# Patient Record
Sex: Female | Born: 1940 | Race: White | Hispanic: No | Smoking: Never smoker
Health system: Southern US, Community
[De-identification: ages and names within clinical notes are randomized; demographics above are authoritative.]

## PROBLEM LIST (undated history)

## (undated) DIAGNOSIS — I340 Nonrheumatic mitral (valve) insufficiency: Secondary | ICD-10-CM

## (undated) DIAGNOSIS — Z9889 Other specified postprocedural states: Secondary | ICD-10-CM

## (undated) DIAGNOSIS — E669 Obesity, unspecified: Secondary | ICD-10-CM

## (undated) DIAGNOSIS — Z953 Presence of xenogenic heart valve: Secondary | ICD-10-CM

## (undated) DIAGNOSIS — I10 Essential (primary) hypertension: Secondary | ICD-10-CM

## (undated) DIAGNOSIS — I5032 Chronic diastolic (congestive) heart failure: Secondary | ICD-10-CM

## (undated) DIAGNOSIS — I428 Other cardiomyopathies: Secondary | ICD-10-CM

## (undated) DIAGNOSIS — I5189 Other ill-defined heart diseases: Secondary | ICD-10-CM

## (undated) DIAGNOSIS — J449 Chronic obstructive pulmonary disease, unspecified: Secondary | ICD-10-CM

## (undated) DIAGNOSIS — I00 Rheumatic fever without heart involvement: Secondary | ICD-10-CM

## (undated) DIAGNOSIS — J9611 Chronic respiratory failure with hypoxia: Secondary | ICD-10-CM

## (undated) DIAGNOSIS — Q231 Congenital insufficiency of aortic valve: Secondary | ICD-10-CM

## (undated) DIAGNOSIS — I272 Pulmonary hypertension, unspecified: Secondary | ICD-10-CM

## (undated) DIAGNOSIS — E785 Hyperlipidemia, unspecified: Secondary | ICD-10-CM

## (undated) DIAGNOSIS — N184 Chronic kidney disease, stage 4 (severe): Secondary | ICD-10-CM

## (undated) DIAGNOSIS — Z8679 Personal history of other diseases of the circulatory system: Secondary | ICD-10-CM

## (undated) DIAGNOSIS — Z8673 Personal history of transient ischemic attack (TIA), and cerebral infarction without residual deficits: Secondary | ICD-10-CM

## (undated) DIAGNOSIS — Q2381 Bicuspid aortic valve: Secondary | ICD-10-CM

## (undated) DIAGNOSIS — M1990 Unspecified osteoarthritis, unspecified site: Secondary | ICD-10-CM

## (undated) DIAGNOSIS — I480 Paroxysmal atrial fibrillation: Secondary | ICD-10-CM

## (undated) HISTORY — DX: Obesity, unspecified: E66.9

## (undated) HISTORY — PX: ABDOMINAL HYSTERECTOMY: SHX81

## (undated) HISTORY — PX: OTHER SURGICAL HISTORY: SHX169

## (undated) HISTORY — PX: TOTAL KNEE ARTHROPLASTY: SHX125

## (undated) HISTORY — DX: Hyperlipidemia, unspecified: E78.5

## (undated) HISTORY — DX: Rheumatic fever without heart involvement: I00

## (undated) SURGERY — ECHOCARDIOGRAM, TRANSESOPHAGEAL
Anesthesia: Moderate Sedation

---

## 2000-05-10 ENCOUNTER — Ambulatory Visit (HOSPITAL_COMMUNITY): Admission: RE | Admit: 2000-05-10 | Discharge: 2000-05-11 | Payer: Self-pay

## 2002-06-22 ENCOUNTER — Inpatient Hospital Stay (HOSPITAL_COMMUNITY): Admission: EM | Admit: 2002-06-22 | Discharge: 2002-06-23 | Payer: Self-pay | Admitting: Emergency Medicine

## 2002-06-22 ENCOUNTER — Encounter: Payer: Self-pay | Admitting: Emergency Medicine

## 2002-06-26 ENCOUNTER — Ambulatory Visit (HOSPITAL_COMMUNITY): Admission: RE | Admit: 2002-06-26 | Discharge: 2002-06-26 | Payer: Self-pay | Admitting: Internal Medicine

## 2002-06-26 ENCOUNTER — Encounter: Payer: Self-pay | Admitting: Internal Medicine

## 2003-11-09 ENCOUNTER — Emergency Department (HOSPITAL_COMMUNITY): Admission: EM | Admit: 2003-11-09 | Discharge: 2003-11-09 | Payer: Self-pay | Admitting: Emergency Medicine

## 2004-02-05 ENCOUNTER — Ambulatory Visit: Payer: Self-pay | Admitting: Family Medicine

## 2004-02-11 ENCOUNTER — Ambulatory Visit (HOSPITAL_COMMUNITY): Admission: RE | Admit: 2004-02-11 | Discharge: 2004-02-11 | Payer: Self-pay | Admitting: Family Medicine

## 2004-03-10 ENCOUNTER — Emergency Department (HOSPITAL_COMMUNITY): Admission: EM | Admit: 2004-03-10 | Discharge: 2004-03-10 | Payer: Self-pay | Admitting: Emergency Medicine

## 2004-03-31 ENCOUNTER — Ambulatory Visit: Payer: Self-pay | Admitting: Family Medicine

## 2004-06-09 ENCOUNTER — Ambulatory Visit: Payer: Self-pay | Admitting: Family Medicine

## 2004-08-11 ENCOUNTER — Ambulatory Visit: Payer: Self-pay | Admitting: Family Medicine

## 2004-08-12 ENCOUNTER — Ambulatory Visit (HOSPITAL_COMMUNITY): Admission: RE | Admit: 2004-08-12 | Discharge: 2004-08-12 | Payer: Self-pay | Admitting: Family Medicine

## 2004-12-14 ENCOUNTER — Ambulatory Visit: Payer: Self-pay | Admitting: Physical Medicine & Rehabilitation

## 2004-12-14 ENCOUNTER — Inpatient Hospital Stay (HOSPITAL_COMMUNITY): Admission: RE | Admit: 2004-12-14 | Discharge: 2004-12-21 | Payer: Self-pay | Admitting: Orthopedic Surgery

## 2004-12-18 ENCOUNTER — Ambulatory Visit: Payer: Self-pay | Admitting: Internal Medicine

## 2005-01-13 ENCOUNTER — Ambulatory Visit: Payer: Self-pay | Admitting: Cardiology

## 2005-01-18 ENCOUNTER — Ambulatory Visit: Payer: Self-pay | Admitting: Cardiology

## 2005-01-19 ENCOUNTER — Ambulatory Visit: Payer: Self-pay | Admitting: Cardiology

## 2011-02-12 DIAGNOSIS — R0602 Shortness of breath: Secondary | ICD-10-CM

## 2011-02-12 DIAGNOSIS — I509 Heart failure, unspecified: Secondary | ICD-10-CM

## 2011-02-15 ENCOUNTER — Ambulatory Visit (INDEPENDENT_AMBULATORY_CARE_PROVIDER_SITE_OTHER): Payer: Medicare Other | Admitting: *Deleted

## 2011-02-15 ENCOUNTER — Encounter: Payer: Self-pay | Admitting: Cardiovascular Disease

## 2011-02-15 ENCOUNTER — Encounter: Payer: Self-pay | Admitting: *Deleted

## 2011-02-15 VITALS — BP 101/67 | HR 68 | Ht 62.0 in | Wt 185.0 lb

## 2011-02-15 DIAGNOSIS — I10 Essential (primary) hypertension: Secondary | ICD-10-CM

## 2011-02-15 DIAGNOSIS — I5030 Unspecified diastolic (congestive) heart failure: Secondary | ICD-10-CM

## 2011-02-15 DIAGNOSIS — R079 Chest pain, unspecified: Secondary | ICD-10-CM

## 2011-02-15 NOTE — Progress Notes (Signed)
Patient came today for nurse visit to check ekg and vitals since heart rate was elevated during hospitalization. Patient denies dizziness,chest pain or sob. Patient hasn't taken any medications today. Ekg and vitals reviewed by Gene Serpe.

## 2011-02-25 ENCOUNTER — Encounter: Payer: Medicare Other | Admitting: Physician Assistant

## 2011-03-19 ENCOUNTER — Encounter: Payer: Medicare Other | Admitting: Physician Assistant

## 2011-04-01 ENCOUNTER — Encounter: Payer: Medicare Other | Admitting: Physician Assistant

## 2012-08-09 DIAGNOSIS — R0602 Shortness of breath: Secondary | ICD-10-CM

## 2012-08-30 ENCOUNTER — Encounter: Payer: Medicare Other | Admitting: Physician Assistant

## 2012-09-13 ENCOUNTER — Encounter: Payer: Medicare Other | Admitting: Physician Assistant

## 2012-09-13 NOTE — Progress Notes (Signed)
Primary Cardiologist: Rollene Rotunda, MD (new)   HPI: Post hospital followup from [facility], status post presentation with acute/chronic DHF. We were not formally consulted. Troponins NL. BNP 290 on admission.  Small, bilateral pleural effusions noted on CTA of chest, which was negative for pulmonary embolus. An echocardiogram was not ordered, given that she had one during her previous hospitalization in March, with results as follows:   - Echocardiogram, 05/2012: EF 60-65%; mild AR/MR; moderate PHTN (RVSP 45-50 mmHg)  Allergies  Allergen Reactions  . Indomethacin Other (See Comments)    dizziness  . Norvasc (Amlodipine Besylate) Cough    Current Outpatient Prescriptions  Medication Sig Dispense Refill  . acetaminophen (TYLENOL) 500 MG tablet Take 500 mg by mouth every 6 (six) hours as needed.        Marland Kitchen aspirin 325 MG tablet Take 325 mg by mouth daily.        Marland Kitchen Cod Liver Oil 1000 MG CAPS Take 1 capsule by mouth daily.        . furosemide (LASIX) 40 MG tablet Take 40 mg by mouth daily.        . pravastatin (PRAVACHOL) 20 MG tablet Take 20 mg by mouth daily.        . solifenacin (VESICARE) 5 MG tablet Take 5 mg by mouth daily.        Marland Kitchen telmisartan (MICARDIS) 40 MG tablet Take 40 mg by mouth daily.         No current facility-administered medications for this visit.    Past Medical History  Diagnosis Date  . Congestive heart failure, unspecified   . Chronic airway obstruction, not elsewhere classified   . Precordial pain   . Swelling of limb   . Dizziness and giddiness   . Unspecified transient cerebral ischemia     Diag. 2003  . Acute, but ill-defined, cerebrovascular disease     2000 [facility]  . Shortness of breath   . Obesity   . Juvenile rheumatic fever     age 27  . Unspecified essential hypertension   . Hyperlipidemia     Past Surgical History  Procedure Laterality Date  . Total knee arthroplasty    . Abdominal hysterectomy      Cervical Cancer  . Parathyroid/thyroid  surgery      tumor    History   Social History  . Marital Status: Widowed    Spouse Name: N/A    Number of Children: N/A  . Years of Education: N/A   Occupational History  . Not on file.   Social History Main Topics  . Smoking status: Never Smoker   . Smokeless tobacco: Never Used  . Alcohol Use: No  . Drug Use: No  . Sexually Active: Not on file   Other Topics Concern  . 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 Heart failure Father   . Heart attack Brother     ROS: no nausea, vomiting; no fever, chills; no melena, hematochezia; no claudication  PHYSICAL EXAM: There were no vitals taken for this visit. GENERAL: 72 year-old female; NAD HEENT: NCAT, PERRLA, EOMI; sclera clear; no xanthelasma NECK: palpable bilateral carotid pulses, no bruits; no JVD; no TM LUNGS: CTA bilaterally CARDIAC: RRR (S1, S2); no significant murmurs; no rubs  or gallops ABDOMEN: soft, non-tender; intact BS EXTREMETIES: intact distal pulses; no significant peripheral edema SKIN: warm/dry; no obvious rash/lesions MUSCULOSKELETAL: no joint deformity NEURO: no focal deficit; NL affect   EKG: reviewed and available in Electronic Records   ASSESSMENT & PLAN:  No problem-specific assessment & plan notes found for this encounter.   [name] [name], PAC

## 2013-02-06 ENCOUNTER — Encounter: Payer: Self-pay | Admitting: Cardiology

## 2013-02-14 ENCOUNTER — Encounter: Payer: Self-pay | Admitting: Cardiology

## 2013-02-14 ENCOUNTER — Ambulatory Visit (INDEPENDENT_AMBULATORY_CARE_PROVIDER_SITE_OTHER): Payer: Medicare Other | Admitting: Cardiology

## 2013-02-14 VITALS — BP 100/67 | HR 65 | Ht 62.0 in | Wt 191.0 lb

## 2013-02-14 DIAGNOSIS — I059 Rheumatic mitral valve disease, unspecified: Secondary | ICD-10-CM

## 2013-02-14 DIAGNOSIS — I4891 Unspecified atrial fibrillation: Secondary | ICD-10-CM

## 2013-02-14 DIAGNOSIS — I5032 Chronic diastolic (congestive) heart failure: Secondary | ICD-10-CM

## 2013-02-14 DIAGNOSIS — I340 Nonrheumatic mitral (valve) insufficiency: Secondary | ICD-10-CM

## 2013-02-14 MED ORDER — ASPIRIN EC 81 MG PO TBEC: 81.0000 mg | DELAYED_RELEASE_TABLET | Freq: Every day | ORAL | Status: DC

## 2013-02-14 NOTE — Progress Notes (Addendum)
Clinical Summary [name] [name] is a 72 y.o.female last seen by PA Serpe, this is our first visit together. She was seen for the following medical problems.  1. Chronic diastolic heart failure 05/2012 echo: LVEF 60-65% - recent admission for volume overload 02/2013 at [facility], patient was diuresed with improved symptoms.   2. Mitral regurgitation - admitted 02/2013 to [facility] with 2 day history of SOB, found to be in heart failure. She was started on diuresis - repeat echo showed moderate to severe MR with elevated PASP - reports SOB at home. Sedentary lifestyle, limited physically because of knee problem. Notes DOE with daily activities. No orthopnea.    3. Paroxysmal afib - new diagnosis made during 02/2013 admission to [facility] - started on metoprolol for rate control 25 mg bid, further titration limited by low normal blood pressures, reported good rate control on this regimen in the hospital - started on xarelto for stroke anticoag - denies any palpitations, denies any bleeding issues on xarelto.      Past Medical History  Diagnosis Date  . Congestive heart failure, unspecified   . Chronic airway obstruction, not elsewhere classified   . Precordial pain   . Swelling of limb   . Dizziness and giddiness   . Unspecified transient cerebral ischemia     Diag. 2003  . Acute, but ill-defined, cerebrovascular disease     2000 [facility]  . Shortness of breath   . Obesity   . Juvenile rheumatic fever     age 81  . Unspecified essential hypertension   . Hyperlipidemia      Allergies  Allergen Reactions  . Indomethacin Other (See Comments)    dizziness  . Norvasc [Amlodipine Besylate] Cough     Current Outpatient Prescriptions  Medication Sig Dispense Refill  . acetaminophen (TYLENOL) 500 MG tablet Take 500 mg by mouth every 6 (six) hours as needed.        Marland Kitchen aspirin 325 MG tablet Take 325 mg by mouth daily.        Marland Kitchen Cod Liver Oil 1000 MG CAPS Take 1 capsule by mouth  daily.        . furosemide (LASIX) 40 MG tablet Take 40 mg by mouth daily.        . pravastatin (PRAVACHOL) 20 MG tablet Take 20 mg by mouth daily.        . solifenacin (VESICARE) 5 MG tablet Take 5 mg by mouth daily.        Marland Kitchen telmisartan (MICARDIS) 40 MG tablet Take 40 mg by mouth daily.         No current facility-administered medications for this visit.     Past Surgical History  Procedure Laterality Date  . Total knee arthroplasty    . Abdominal hysterectomy      Cervical Cancer  . Parathyroid/thyroid surgery      tumor     Allergies  Allergen Reactions  . Indomethacin Other (See Comments)    dizziness  . Norvasc [Amlodipine Besylate] Cough      Family History  Problem Relation Age of Onset  . Heart failure Father   . Heart attack Brother      Social History Ms. Sargent reports that she has never smoked. She has never used smokeless tobacco. Ms. Schexnider reports that she does not drink alcohol.   Review of Systems CONSTITUTIONAL: No weight loss, fever, chills, weakness or fatigue.  HEENT: Eyes: No visual loss, blurred vision, double vision or yellow  sclerae.No hearing loss, sneezing, congestion, runny nose or sore throat.  SKIN: No rash or itching.  CARDIOVASCULAR: per HPI RESPIRATORY: per HPI GASTROINTESTINAL: No anorexia, nausea, vomiting or diarrhea. No abdominal pain or blood.  GENITOURINARY: No burning on urination, no polyuria NEUROLOGICAL: No headache, dizziness, syncope, paralysis, ataxia, numbness or tingling in the extremities. No change in bowel or bladder control.  MUSCULOSKELETAL: No muscle, back pain, joint pain or stiffness.  LYMPHATICS: No enlarged nodes. No history of splenectomy.  PSYCHIATRIC: No history of depression or anxiety.  ENDOCRINOLOGIC: No reports of sweating, cold or heat intolerance. No polyuria or polydipsia.  Marland Kitchen   Physical Examination p 65 bp 100/67 Wt 191 lbs BMI 35 Gen: resting comfortably, no acute distress HEENT: no  scleral icterus, pupils equal round and reactive, no palptable cervical adenopathy,  CV: RRR, 2/6 systolic murmur at apex, no JVD, no carotid bruits Resp: Clear to auscultation bilaterally GI: abdomen is soft, non-tender, non-distended, normal bowel sounds, no hepatosplenomegaly MSK: extremities are warm, no edema.  Skin: warm, no rash Neuro:  no focal deficits Psych: appropriate affect   Diagnostic Studies 02/2011 Echo LVEF 60-65%, abnormal diastolic function, mild MR, PASP 50 mmHg.    Assessment and Plan  1. Chronic diastolic heart failure - recent admission for volume overload, her volume status looks normal today - continue bp control and current diuretic dosing  2. Mitral regurgitation - new diagnosis for the patient, per report rated as moderate to severe. We have requested the echo images to review, she may need further evaluation with TEE pending review  3. Afib - new diagnosis, no significant symptoms on rate control strategy. She was also started on xarelto for anticoagulation and is tolerating well - continue current medications.   F/u 3-4 weeks to readdress mitral regurgitation and possible need for TEE    Antoine Poche, M.D., F.A.C.C.  Addendum 02/16/13 Echo images reviewed. In most views consistent with moderate regurgitation, in the apical 2 chamber view the jet is more impressive, there appears to be 2 centrally directed regurgitant jets in this view, the fact that they are both centrally directed makes the overall color jet appear more impressive. Give her presentation with heart failure, the valve needs closer evaluation. We will plan a TEE.  Dina Rich MD

## 2013-02-14 NOTE — Patient Instructions (Signed)
   Decrease Aspirin to 81mg  daily  Continue all other medications.   Follow up in  3-4 weeks

## 2013-02-16 ENCOUNTER — Other Ambulatory Visit: Payer: Self-pay | Admitting: Cardiology

## 2013-02-16 ENCOUNTER — Telehealth: Payer: Self-pay | Admitting: Cardiology

## 2013-02-16 DIAGNOSIS — I340 Nonrheumatic mitral (valve) insufficiency: Secondary | ICD-10-CM | POA: Insufficient documentation

## 2013-02-16 DIAGNOSIS — R931 Abnormal findings on diagnostic imaging of heart and coronary circulation: Secondary | ICD-10-CM

## 2013-02-16 DIAGNOSIS — I4891 Unspecified atrial fibrillation: Secondary | ICD-10-CM | POA: Insufficient documentation

## 2013-02-16 NOTE — Telephone Encounter (Signed)
Message copied by Burnice Logan on Fri Feb 16, 2013 10:27 AM ------      Message from: Dina Rich F      Created: Fri Feb 16, 2013 10:12 AM       Please let patient know that I reviewed the ultrasound of her heart from [facility]. Her heart valve is moderately to severely leaky, and we do need to do that other type of ultrasound (a TEE, an ultrasound from the stomach) to take a closer look. This will be done at [facility], and can be done after the holidays. Please arrange a TEE at [facility] to be done on my hospital week there                  Dina Rich MD ------

## 2013-02-16 NOTE — Telephone Encounter (Signed)
Pt informed of results. Informed pt that someone would be in contact with her with a day and time for the test. Then I would call her back with her instructions. Pt verbalized understanding.

## 2013-02-19 ENCOUNTER — Encounter: Payer: Self-pay | Admitting: Cardiology

## 2013-02-20 ENCOUNTER — Encounter: Payer: Self-pay | Admitting: Cardiology

## 2013-02-20 ENCOUNTER — Telehealth: Payer: Self-pay | Admitting: Cardiology

## 2013-02-20 NOTE — Telephone Encounter (Signed)
Pt informed and letter mailed

## 2013-02-20 NOTE — Telephone Encounter (Signed)
Message copied by Burnice Logan on Tue Feb 20, 2013  4:04 PM ------      Message from: Zachary George T      Created: Mon Feb 19, 2013  4:17 PM      Regarding: TEE SCHEDULED        TEE scheduled for 03-06-13 she needs to arrive @ 10:30am      For 11:30am procedure. No letter given to patient with      Instructions. Patient needs to be notified.  ------

## 2013-02-21 ENCOUNTER — Encounter (HOSPITAL_COMMUNITY): Payer: Self-pay | Admitting: Pharmacy Technician

## 2013-02-23 ENCOUNTER — Encounter (HOSPITAL_COMMUNITY): Payer: Self-pay | Admitting: Pharmacy Technician

## 2013-02-23 ENCOUNTER — Ambulatory Visit: Admit: 2013-02-23 | Payer: Self-pay | Admitting: Cardiovascular Disease

## 2013-02-23 SURGERY — CARDIOVERSION
Anesthesia: Monitor Anesthesia Care

## 2013-03-06 ENCOUNTER — Inpatient Hospital Stay (HOSPITAL_COMMUNITY): Admission: RE | Admit: 2013-03-06 | Payer: Medicare Other | Source: Ambulatory Visit

## 2013-03-06 ENCOUNTER — Ambulatory Visit (HOSPITAL_COMMUNITY): Admission: RE | Admit: 2013-03-06 | Payer: Medicare Other | Source: Ambulatory Visit | Admitting: Cardiology

## 2013-03-06 ENCOUNTER — Encounter (HOSPITAL_COMMUNITY): Admission: RE | Payer: Self-pay | Source: Ambulatory Visit

## 2013-03-06 SURGERY — ECHOCARDIOGRAM, TRANSESOPHAGEAL
Anesthesia: Moderate Sedation

## 2013-03-07 ENCOUNTER — Ambulatory Visit: Payer: Medicare Other | Admitting: Cardiology

## 2013-03-07 ENCOUNTER — Other Ambulatory Visit: Payer: Self-pay | Admitting: Cardiology

## 2013-03-07 MED ORDER — POTASSIUM CHLORIDE CRYS ER 20 MEQ PO TBCR: 20.0000 meq | EXTENDED_RELEASE_TABLET | Freq: Two times a day (BID) | ORAL | Status: DC

## 2013-03-07 MED ORDER — METOPROLOL TARTRATE 25 MG PO TABS: 25.0000 mg | ORAL_TABLET | Freq: Two times a day (BID) | ORAL | Status: DC

## 2013-03-07 MED ORDER — FUROSEMIDE 40 MG PO TABS: 60.0000 mg | ORAL_TABLET | Freq: Two times a day (BID) | ORAL | Status: DC

## 2013-03-07 NOTE — Progress Notes (Signed)
Clinical Summary [name] [name] is a 72 y.o.female seen today for follow up of the following medical problems.   1. Chronic diastolic heart failure  05/2012 echo: LVEF 60-65%  - recent admission for volume overload 02/2013 at [facility], patient was diuresed with improved symptoms.   2. Mitral regurgitation  - admitted 02/2013 to [facility] with 2 day history of SOB, found to be in heart failure. She was started on diuresis  - repeat echo showed moderate to severe MR with elevated PASP  - reports SOB at home. Sedentary lifestyle, limited physically because of knee problem. Notes DOE with daily activities. No orthopnea.  - TEE cancelled  3. Paroxysmal afib  - new diagnosis made during 02/2013 admission to [facility]  - started on metoprolol for rate control 25 mg bid, further titration limited by low normal blood pressures, reported good rate control on this regimen in the hospital  - started on xarelto for stroke anticoag  - denies any palpitations, denies any bleeding issues on xarelto.    Past Medical History  Diagnosis Date  . Congestive heart failure, unspecified   . Chronic airway obstruction, not elsewhere classified   . Precordial pain   . Swelling of limb   . Dizziness and giddiness   . Unspecified transient cerebral ischemia     Diag. 2003  . Acute, but ill-defined, cerebrovascular disease     2000 [facility]  . Shortness of breath   . Obesity   . Juvenile rheumatic fever     age 79  . Unspecified essential hypertension   . Hyperlipidemia      Allergies  Allergen Reactions  . Indomethacin Other (See Comments)    dizziness  . Norvasc [Amlodipine Besylate] Cough     Current Outpatient Prescriptions  Medication Sig Dispense Refill  . aspirin EC 81 MG tablet Take 1 tablet (81 mg total) by mouth daily.      . furosemide (LASIX) 40 MG tablet Take 60 mg by mouth 2 (two) times daily.       . metoprolol tartrate (LOPRESSOR) 25 MG tablet Take 25 mg by mouth 2 (two) times  daily.       . Multiple Vitamin (MULTIVITAMIN) tablet Take 1 tablet by mouth daily.      . potassium chloride SA (K-DUR,KLOR-CON) 20 MEQ tablet Take 1 tablet by mouth 2 (two) times daily.      . pravastatin (PRAVACHOL) 20 MG tablet Take 20 mg by mouth daily.        . solifenacin (VESICARE) 5 MG tablet Take 5 mg by mouth daily.        Marland Kitchen telmisartan (MICARDIS) 40 MG tablet Take 40 mg by mouth 2 (two) times daily.        No current facility-administered medications for this visit.     Past Surgical History  Procedure Laterality Date  . Total knee arthroplasty    . Abdominal hysterectomy      Cervical Cancer  . Parathyroid/thyroid surgery      tumor     Allergies  Allergen Reactions  . Indomethacin Other (See Comments)    dizziness  . Norvasc [Amlodipine Besylate] Cough      Family History  Problem Relation Age of Onset  . Heart failure Father   . Heart attack Brother      Social History [name] [name] reports that she has never smoked. She has never used smokeless tobacco. [name] [name] reports that she does not drink alcohol.   Review  of Systems CONSTITUTIONAL: No weight loss, fever, chills, weakness or fatigue.  HEENT: Eyes: No visual loss, blurred vision, double vision or yellow sclerae.No hearing loss, sneezing, congestion, runny nose or sore throat.  SKIN: No rash or itching.  CARDIOVASCULAR:  RESPIRATORY: No shortness of breath, cough or sputum.  GASTROINTESTINAL: No anorexia, nausea, vomiting or diarrhea. No abdominal pain or blood.  GENITOURINARY: No burning on urination, no polyuria NEUROLOGICAL: No headache, dizziness, syncope, paralysis, ataxia, numbness or tingling in the extremities. No change in bowel or bladder control.  MUSCULOSKELETAL: No muscle, back pain, joint pain or stiffness.  LYMPHATICS: No enlarged nodes. No history of splenectomy.  PSYCHIATRIC: No history of depression or anxiety.  ENDOCRINOLOGIC: No reports of sweating, cold or heat  intolerance. No polyuria or polydipsia.  Marland Kitchen   Physical Examination There were no vitals filed for this visit. There were no vitals filed for this visit.  Gen: resting comfortably, no acute distress HEENT: no scleral icterus, pupils equal round and reactive, no palptable cervical adenopathy,  CV Resp: Clear to auscultation bilaterally GI: abdomen is soft, non-tender, non-distended, normal bowel sounds, no hepatosplenomegaly MSK: extremities are warm, no edema.  Skin: warm, no rash Neuro:  no focal deficits Psych: appropriate affect   Diagnostic Studies 02/2011 Echo  LVEF 60-65%, abnormal diastolic function, mild MR, PASP 50 mmHg.     Assessment and Plan  1. Chronic diastolic heart failure  - recent admission for volume overload, her volume status looks normal today  - continue bp control and current diuretic dosing   2. Mitral regurgitation  - new diagnosis for the patient, per report rated as moderate to severe. We have requested the echo images to review, she may need further evaluation with TEE pending review   3. Afib  - new diagnosis, no significant symptoms on rate control strategy. She was also started on xarelto for anticoagulation and is tolerating well  - continue current medications.  F/u 3-4 weeks to readdress mitral regurgitation and possible need for TEE    Echo images reviewed. In most views consistent with moderate regurgitation, in the apical 2 chamber view the jet is more impressive, there appears to be 2 centrally directed regurgitant jets in this view, the fact that they are both centrally directed makes the overall color jet appear more impressive. Give her presentation with heart failure, the valve needs closer evaluation. We will plan a TEE.    Antoine Poche, M.D., F.A.C.C.

## 2013-03-13 ENCOUNTER — Telehealth: Payer: Self-pay | Admitting: Cardiology

## 2013-03-13 NOTE — Telephone Encounter (Signed)
Called pt and informed her that test was scheduled for Friday at 8:30 pt stated that she could not be there and it would need to be rescheduled for next week but MD is not at hospital next week. Sent MD a note asking if it would be ok to hold test until last week of the month.

## 2013-03-15 NOTE — Telephone Encounter (Signed)
Informed pt granddaughter that test can be done at the end of month in [place] or sooner in [place]. She said that the end of the month any day after Tuesday 1-27 would be good for her grandmother. Will notify Coralyn Mark to have this scheduled and will contact pt with date and time.

## 2013-03-15 NOTE — Telephone Encounter (Signed)
Message copied by Lewayne Bunting on Thu Mar 15, 2013 11:43 AM ------      Message from: [place] F      Created: Wed Mar 14, 2013 12:10 PM      Regarding: RE: TEE       Please give patient the patient the option of getting the TEE done at the end of the month here in [place] or we can get it done sooner if she is willing to go to [place]. Medically either option is fine.                  Carlyle Dolly      ----- Message -----         From: Lewayne Bunting, CMA         Sent: 03/13/2013   9:48 AM           To: [name], MD      Subject: TEE                                                      Terry rescheduled TEE for Friday 1-9 but pt can not do it this day. Will it be ok to not schedule this until the last week of the month?            Thanks      Pamala Hurry       ------

## 2013-03-16 ENCOUNTER — Other Ambulatory Visit (HOSPITAL_COMMUNITY): Payer: Medicare Other

## 2013-03-16 ENCOUNTER — Ambulatory Visit: Admit: 2013-03-16 | Payer: Self-pay | Admitting: Cardiology

## 2013-03-16 SURGERY — ECHOCARDIOGRAM, TRANSESOPHAGEAL
Anesthesia: Moderate Sedation

## 2013-03-20 ENCOUNTER — Encounter: Payer: Self-pay | Admitting: Cardiology

## 2013-03-20 ENCOUNTER — Emergency Department (HOSPITAL_COMMUNITY)
Admission: EM | Admit: 2013-03-20 | Discharge: 2013-03-20 | Disposition: A | Discharge status: Home / Self Care | Payer: Medicare HMO | Attending: Emergency Medicine | Admitting: Emergency Medicine

## 2013-03-20 ENCOUNTER — Ambulatory Visit: Payer: Medicare Other | Admitting: Cardiology

## 2013-03-20 ENCOUNTER — Encounter (HOSPITAL_COMMUNITY): Payer: Self-pay | Admitting: Emergency Medicine

## 2013-03-20 ENCOUNTER — Emergency Department (HOSPITAL_COMMUNITY): Payer: Medicare HMO

## 2013-03-20 DIAGNOSIS — I10 Essential (primary) hypertension: Secondary | ICD-10-CM | POA: Insufficient documentation

## 2013-03-20 DIAGNOSIS — E785 Hyperlipidemia, unspecified: Secondary | ICD-10-CM | POA: Insufficient documentation

## 2013-03-20 DIAGNOSIS — J189 Pneumonia, unspecified organism: Secondary | ICD-10-CM

## 2013-03-20 DIAGNOSIS — E669 Obesity, unspecified: Secondary | ICD-10-CM | POA: Insufficient documentation

## 2013-03-20 DIAGNOSIS — R011 Cardiac murmur, unspecified: Secondary | ICD-10-CM | POA: Insufficient documentation

## 2013-03-20 DIAGNOSIS — J441 Chronic obstructive pulmonary disease with (acute) exacerbation: Secondary | ICD-10-CM | POA: Insufficient documentation

## 2013-03-20 DIAGNOSIS — I509 Heart failure, unspecified: Secondary | ICD-10-CM

## 2013-03-20 DIAGNOSIS — Z79899 Other long term (current) drug therapy: Secondary | ICD-10-CM | POA: Insufficient documentation

## 2013-03-20 DIAGNOSIS — J159 Unspecified bacterial pneumonia: Secondary | ICD-10-CM | POA: Insufficient documentation

## 2013-03-20 DIAGNOSIS — Z7982 Long term (current) use of aspirin: Secondary | ICD-10-CM | POA: Insufficient documentation

## 2013-03-20 LAB — CBC
HCT: 40.8 % (ref 36.0–46.0)
Hemoglobin: 13.5 g/dL (ref 12.0–15.0)
MCH: 32 pg (ref 26.0–34.0)
MCHC: 33.1 g/dL (ref 30.0–36.0)
MCV: 96.7 fL (ref 78.0–100.0)
Platelets: 204 10*3/uL (ref 150–400)
RBC: 4.22 MIL/uL (ref 3.87–5.11)
RDW: 13.5 % (ref 11.5–15.5)
WBC: 6.5 10*3/uL (ref 4.0–10.5)

## 2013-03-20 LAB — BASIC METABOLIC PANEL
BUN: 30 mg/dL — ABNORMAL HIGH (ref 6–23)
CALCIUM: 9.6 mg/dL (ref 8.4–10.5)
CO2: 31 mEq/L (ref 19–32)
Chloride: 100 mEq/L (ref 96–112)
Creatinine, Ser: 1.23 mg/dL — ABNORMAL HIGH (ref 0.50–1.10)
GFR calc non Af Amer: 43 mL/min — ABNORMAL LOW (ref >90)
GFR, EST AFRICAN AMERICAN: 50 mL/min — AB (ref >90)
GLUCOSE: 113 mg/dL — AB (ref 70–99)
Potassium: 3.9 mEq/L (ref 3.7–5.3)
SODIUM: 142 meq/L (ref 137–147)

## 2013-03-20 LAB — PRO B NATRIURETIC PEPTIDE: PRO B NATRI PEPTIDE: 757.6 pg/mL — AB (ref 0–125)

## 2013-03-20 LAB — URINALYSIS, ROUTINE W REFLEX MICROSCOPIC
BILIRUBIN URINE: NEGATIVE
Glucose, UA: NEGATIVE mg/dL
Hgb urine dipstick: NEGATIVE
KETONES UR: NEGATIVE mg/dL
Leukocytes, UA: NEGATIVE
NITRITE: NEGATIVE
Protein, ur: NEGATIVE mg/dL
Specific Gravity, Urine: 1.01 (ref 1.005–1.030)
Urobilinogen, UA: 0.2 mg/dL (ref 0.0–1.0)
pH: 5.5 (ref 5.0–8.0)

## 2013-03-20 LAB — TROPONIN I

## 2013-03-20 MED ORDER — AZITHROMYCIN 250 MG PO TABS
500.0000 mg | ORAL_TABLET | Freq: Once | ORAL | Status: AC
  Administered 2013-03-20: 500 mg via ORAL
  Filled 2013-03-20: qty 2

## 2013-03-20 MED ORDER — AZITHROMYCIN 250 MG PO TABS: 250.0000 mg | ORAL_TABLET | Freq: Every day | ORAL | Status: DC

## 2013-03-20 MED ORDER — FUROSEMIDE 10 MG/ML IJ SOLN
60.0000 mg | INTRAMUSCULAR | Status: AC
  Administered 2013-03-20: 60 mg via INTRAVENOUS
  Filled 2013-03-20: qty 6

## 2013-03-20 MED ORDER — ASPIRIN 81 MG PO CHEW
324.0000 mg | CHEWABLE_TABLET | Freq: Once | ORAL | Status: AC
  Administered 2013-03-20: 324 mg via ORAL
  Filled 2013-03-20: qty 4

## 2013-03-20 NOTE — ED Notes (Signed)
Pt assisted to restroom X1 prior to being discharged,

## 2013-03-20 NOTE — ED Provider Notes (Signed)
CSN: [id number]     Arrival date & time 03/20/13  0249 History   First MD Initiated Contact with Patient 03/20/13 [phone]     Chief Complaint  Patient presents with  . Shortness of Breath   (Consider location/radiation/quality/duration/timing/severity/associated sxs/prior Treatment) HPI Comments: 73 year old female with a history of congestive heart failure, recent admission to the hospital in December that required diuresis and was found at that time to have moderate to severe mitral regurgitation. Her cardiologist is Dr. Harl Bowie. She states that at 1:00 in the morning she developed acute onset of shortness of breath and orthopnea which has been persistent, moderate, worse with laying supine. She has no associated fevers chills, she has had minimal cough and is persistently short of breath though is only mild in the upright position. She does not use home oxygen. Last ejection fraction documented was 65% in March of 2014.  Patient is a 73 y.o. female presenting with shortness of breath. The history is provided by the patient, a relative and medical records.  Shortness of Breath   Past Medical History  Diagnosis Date  . Congestive heart failure, unspecified   . Chronic airway obstruction, not elsewhere classified   . Precordial pain   . Swelling of limb   . Dizziness and giddiness   . Unspecified transient cerebral ischemia     Diag. 2003  . Acute, but ill-defined, cerebrovascular disease     2000 [place]  . Shortness of breath   . Obesity   . Juvenile rheumatic fever     age 64  . Unspecified essential hypertension   . Hyperlipidemia    Past Surgical History  Procedure Laterality Date  . Total knee arthroplasty    . Abdominal hysterectomy      Cervical Cancer  . Parathyroid/thyroid surgery      tumor   Family History  Problem Relation Age of Onset  . Heart failure Father   . Heart attack Brother    History  Substance Use Topics  . Smoking status: Never Smoker   . Smokeless  tobacco: Never Used  . Alcohol Use: No   OB History   Grav Para Term Preterm Abortions TAB SAB Ect Mult Living                 Review of Systems  Respiratory: Positive for shortness of breath.   All other systems reviewed and are negative.    Allergies  Indomethacin and Norvasc  Home Medications   Current Outpatient Rx  Name  Route  Sig  Dispense  Refill  . aspirin EC 81 MG tablet   Oral   Take 1 tablet (81 mg total) by mouth daily.           Dose decreased 02/14/2013   . azithromycin (ZITHROMAX Z-PAK) 250 MG tablet   Oral   Take 1 tablet (250 mg total) by mouth daily. 500mg  PO day 1, then 250mg  PO days 205   6 tablet   0   . furosemide (LASIX) 40 MG tablet   Oral   Take 1.5 tablets (60 mg total) by mouth 2 (two) times daily.   90 tablet   6   . metoprolol tartrate (LOPRESSOR) 25 MG tablet   Oral   Take 1 tablet (25 mg total) by mouth 2 (two) times daily.   60 tablet   6   . Multiple Vitamin (MULTIVITAMIN) tablet   Oral   Take 1 tablet by mouth daily.         Marland Kitchen  potassium chloride SA (K-DUR,KLOR-CON) 20 MEQ tablet   Oral   Take 1 tablet (20 mEq total) by mouth 2 (two) times daily.   60 tablet   6   . pravastatin (PRAVACHOL) 20 MG tablet   Oral   Take 20 mg by mouth daily.           . solifenacin (VESICARE) 5 MG tablet   Oral   Take 5 mg by mouth daily.           Marland Kitchen telmisartan (MICARDIS) 40 MG tablet   Oral   Take 40 mg by mouth 2 (two) times daily.           BP 127/64  Pulse 60  Temp(Src) 97.5 F (36.4 C) (Oral)  Resp 17  Ht 5\' 2"  (1.575 m)  Wt 195 lb (88.451 kg)  BMI 35.66 kg/m2  SpO2 96% Physical Exam  Nursing note and vitals reviewed. Constitutional: She appears well-developed and well-nourished. No distress.  HENT:  Head: Normocephalic and atraumatic.  Mouth/Throat: Oropharynx is clear and moist. No oropharyngeal exudate.  Eyes: Conjunctivae and EOM are normal. Pupils are equal, round, and reactive to light. Right eye  exhibits no discharge. Left eye exhibits no discharge. No scleral icterus.  Neck: Normal range of motion. Neck supple. No JVD present. No thyromegaly present.  Cardiovascular: Normal rate, regular rhythm and intact distal pulses.  Exam reveals no gallop and no friction rub.   Murmur heard. Pulmonary/Chest: Effort normal. No respiratory distress. She has no wheezes. She has rales ( bases bilaterally).  Abdominal: Soft. Bowel sounds are normal. She exhibits no distension and no mass. There is no tenderness.  Musculoskeletal: Normal range of motion. She exhibits no edema and no tenderness.  Lymphadenopathy:    She has no cervical adenopathy.  Neurological: She is alert. Coordination normal.  Skin: Skin is warm and dry. No rash noted. No erythema.  Psychiatric: She has a normal mood and affect. Her behavior is normal.    ED Course  Procedures (including critical care time) Labs Review Labs Reviewed  PRO B NATRIURETIC PEPTIDE - Abnormal; Notable for the following:    Pro B Natriuretic peptide (BNP) 757.6 (*)    All other components within normal limits  BASIC METABOLIC PANEL - Abnormal; Notable for the following:    Glucose, Bld 113 (*)    BUN 30 (*)    Creatinine, Ser 1.23 (*)    GFR calc non Af Amer 43 (*)    GFR calc Af Amer 50 (*)    All other components within normal limits  URINALYSIS, ROUTINE W REFLEX MICROSCOPIC - Abnormal; Notable for the following:    Color, Urine STRAW (*)    All other components within normal limits  CBC  TROPONIN I   Imaging Review Dg Chest 2 View  03/20/2013   CLINICAL DATA:  Shortness of breath, history CHF  EXAM: CHEST  2 VIEW  COMPARISON:  Chest radiograph February 08, 2013  FINDINGS: The cardiac silhouette remains moderately enlarged, mediastinal silhouette is nonsuspicious, mildly calcified aortic knob. Central pulmonary vasculature congestion, decreased from prior examination. Minimal right middle lobe patchy airspace opacity. No pleural effusions.  No pneumothorax.  Surgical clips in the left neck may reflect thyroidectomy. Severe degenerative change of the shoulder. Multiple EKG lines overlie the patient and may obscure subtle underlying pathology.  IMPRESSION: Stable cardiomegaly, with decreased pulmonary edema, mild residual central pulmonary vasculature congestion. Minimal right middle lobe patchy airspace opacity.   Electronically Signed  By: Elon Alas   On: 03/20/2013 06:37    ED ECG REPORT  I personally interpreted this EKG   Date: 03/20/2013   Rate: 58  Rhythm: sinus bradycardia  QRS Axis: left  Intervals: normal  ST/T Wave abnormalities: nonspecific T wave changes  Conduction Disutrbances:nonspecific intraventricular conduction delay  Narrative Interpretation: Left ventricular hypertrophy present  Old EKG Reviewed: Since last tracing the rate is slower   MDM   1. Congestive heart failure   2. CAP (community acquired pneumonia)    The patient is not tachypneic though she is mildly hypoxic at 91% on room air. She has bilateral rales in the lungs and a murmur consistent with mitral regurgitation.  Will provide diuresis, chest x-ray, labs, reevaluate.  X-ray shows improved aeration of the lungs, decreased pulmonary edema compared to prior x-ray but a possible right-sided patchy air space infiltrates. The patient has not had fevers, has no leukocytosis and is not tachycardic febrile or hypotensive. She has improved significantly after getting intravenous Lasix and has diuresed approximately 750 cc of urine. She states that subjectively she feels much much better and is requesting discharge.  BNP is minimally elevated  I have explained to the patient indications for return, she will followup with her doctor in a timely manner, Zithromax prescribed for possible pneumonia the low index of suspicion.   Meds given in ED:  Medications  azithromycin (ZITHROMAX) tablet 500 mg (not administered)  furosemide (LASIX)  injection 60 mg (60 mg Intravenous Given 03/20/13 0423)  aspirin chewable tablet 324 mg (324 mg Oral Given 03/20/13 0414)    New Prescriptions   AZITHROMYCIN (ZITHROMAX Z-PAK) 250 MG TABLET    Take 1 tablet (250 mg total) by mouth daily. 500mg  PO day 1, then 250mg  PO days [place] Ski Polich, MD 03/20/13 [phone]

## 2013-03-20 NOTE — Progress Notes (Signed)
Encounter opened in error

## 2013-03-20 NOTE — Discharge Instructions (Signed)
Please follow up with your doctor in next 2 days - you have received an extra dose of your fluid medicine (furosemide) which has caused you to urinate off some of your fluid - your xray shows that you may have a small pneumonia in your right lung - take the zithromax for this.  Please call your doctor for a followup appointment within 24-48 hours. When you talk to your doctor please let them know that you were seen in the emergency department and have them acquire all of your records so that they can discuss the findings with you and formulate a treatment plan to fully care for your new and ongoing problems.

## 2013-03-20 NOTE — ED Notes (Signed)
Received report on pt, pt states that she is feeling better,

## 2013-03-20 NOTE — ED Notes (Signed)
Pt c/o sob since 1am.

## 2013-03-27 ENCOUNTER — Telehealth: Payer: Self-pay | Admitting: Cardiology

## 2013-03-27 ENCOUNTER — Encounter (HOSPITAL_COMMUNITY): Payer: Self-pay | Admitting: Pharmacy Technician

## 2013-03-27 NOTE — Telephone Encounter (Signed)
Informed pt TEE was schedule for Friday 04-06-13 and she will need to arrive at outpatient at 9:45 that morning. Pt verbalized understanding. Informed pt to bring medication list and insurance cards with her and nothing to eat or drink after midnight.

## 2013-04-05 ENCOUNTER — Other Ambulatory Visit: Payer: Self-pay | Admitting: Cardiology

## 2013-04-05 DIAGNOSIS — I340 Nonrheumatic mitral (valve) insufficiency: Secondary | ICD-10-CM

## 2013-04-06 ENCOUNTER — Encounter (HOSPITAL_COMMUNITY)
Admission: RE | Disposition: A | Discharge status: Home / Self Care | Payer: Self-pay | Source: Ambulatory Visit | Attending: Cardiology

## 2013-04-06 ENCOUNTER — Ambulatory Visit (HOSPITAL_COMMUNITY)
Admission: RE | Admit: 2013-04-06 | Discharge: 2013-04-06 | Disposition: A | Discharge status: Home / Self Care | Payer: Medicare HMO | Source: Ambulatory Visit | Attending: Cardiology | Admitting: Cardiology

## 2013-04-06 ENCOUNTER — Encounter (HOSPITAL_COMMUNITY): Payer: Self-pay | Admitting: *Deleted

## 2013-04-06 DIAGNOSIS — Z7982 Long term (current) use of aspirin: Secondary | ICD-10-CM | POA: Insufficient documentation

## 2013-04-06 DIAGNOSIS — J449 Chronic obstructive pulmonary disease, unspecified: Secondary | ICD-10-CM | POA: Insufficient documentation

## 2013-04-06 DIAGNOSIS — I340 Nonrheumatic mitral (valve) insufficiency: Secondary | ICD-10-CM

## 2013-04-06 DIAGNOSIS — I679 Cerebrovascular disease, unspecified: Secondary | ICD-10-CM | POA: Insufficient documentation

## 2013-04-06 DIAGNOSIS — I059 Rheumatic mitral valve disease, unspecified: Secondary | ICD-10-CM | POA: Insufficient documentation

## 2013-04-06 DIAGNOSIS — I359 Nonrheumatic aortic valve disorder, unspecified: Secondary | ICD-10-CM | POA: Diagnosis not present

## 2013-04-06 DIAGNOSIS — R931 Abnormal findings on diagnostic imaging of heart and coronary circulation: Secondary | ICD-10-CM

## 2013-04-06 DIAGNOSIS — I10 Essential (primary) hypertension: Secondary | ICD-10-CM | POA: Insufficient documentation

## 2013-04-06 DIAGNOSIS — J4489 Other specified chronic obstructive pulmonary disease: Secondary | ICD-10-CM | POA: Insufficient documentation

## 2013-04-06 HISTORY — PX: TEE WITHOUT CARDIOVERSION: SHX5443

## 2013-04-06 SURGERY — ECHOCARDIOGRAM, TRANSESOPHAGEAL
Anesthesia: Moderate Sedation

## 2013-04-06 MED ORDER — MIDAZOLAM HCL 5 MG/5ML IJ SOLN
INTRAMUSCULAR | Status: AC
  Filled 2013-04-06: qty 10

## 2013-04-06 MED ORDER — BUTAMBEN-TETRACAINE-BENZOCAINE 2-2-14 % EX AERO
INHALATION_SPRAY | CUTANEOUS | Status: DC | PRN
  Administered 2013-04-06: 2 via TOPICAL

## 2013-04-06 MED ORDER — SODIUM CHLORIDE 0.9 % IV SOLN
INTRAVENOUS | Status: DC
  Administered 2013-04-06: 11:00:00 via INTRAVENOUS

## 2013-04-06 MED ORDER — LIDOCAINE VISCOUS 2 % MT SOLN
OROMUCOSAL | Status: DC | PRN
  Administered 2013-04-06 (×2): 5 mL via OROMUCOSAL

## 2013-04-06 MED ORDER — MIDAZOLAM HCL 5 MG/5ML IJ SOLN
INTRAMUSCULAR | Status: DC | PRN
  Administered 2013-04-06 (×2): 1 mg via INTRAVENOUS
  Administered 2013-04-06: 0.5 mg via INTRAVENOUS

## 2013-04-06 MED ORDER — LIDOCAINE VISCOUS 2 % MT SOLN
OROMUCOSAL | Status: AC
  Filled 2013-04-06: qty 30

## 2013-04-06 MED ORDER — FENTANYL CITRATE 0.05 MG/ML IJ SOLN
INTRAMUSCULAR | Status: AC
  Filled 2013-04-06: qty 4

## 2013-04-06 MED ORDER — FENTANYL CITRATE 0.05 MG/ML IJ SOLN
INTRAMUSCULAR | Status: DC | PRN
  Administered 2013-04-06: 50 ug via INTRAVENOUS
  Administered 2013-04-06 (×2): 25 ug via INTRAVENOUS

## 2013-04-06 NOTE — H&P (Signed)
Procedure History and physical  73 yo female referred for history of moderate to severe mitral regurgitation by TTE with SOB. Plan for TEE today to further evaluate mitral valve.     Carlyle Dolly MD                                                                    Clinical Summary  Ms. Poppell is a 73 y.o.female seen today for follow up of the following medical problems.  1. Chronic diastolic heart failure  09/8293 echo: LVEF 60-65%  - recent admission for volume overload 02/2013 at [facility], patient was diuresed with improved symptoms.  2. Mitral regurgitation  - admitted 02/2013 to [facility] with 2 day history of SOB, found to be in heart failure. She was started on diuresis  - repeat echo showed moderate to severe MR with elevated PASP  - reports SOB at home. Sedentary lifestyle, limited physically because of knee problem. Notes DOE with daily activities. No orthopnea.  - TEE cancelled  3. Paroxysmal afib  - new diagnosis made during 02/2013 admission to [facility]  - started on metoprolol for rate control 25 mg bid, further titration limited by low normal blood pressures, reported good rate control on this regimen in the hospital  - started on xarelto for stroke anticoag  - denies any palpitations, denies any bleeding issues on xarelto.  Past Medical History   Diagnosis  Date   .  Congestive heart failure, unspecified    .  Chronic airway obstruction, not elsewhere classified    .  Precordial pain    .  Swelling of limb    .  Dizziness and giddiness    .  Unspecified transient cerebral ischemia      Diag. 2003   .  Acute, but ill-defined, cerebrovascular disease      2000 [place]   .  Shortness of breath    .  Obesity    .  Juvenile rheumatic fever      age 57   .  Unspecified essential hypertension    .  Hyperlipidemia     Allergies   Allergen  Reactions   .  Indomethacin  Other (See Comments)     dizziness   .  Norvasc [Amlodipine Besylate]  Cough    Current  Outpatient Prescriptions   Medication  Sig  Dispense  Refill   .  aspirin EC 81 MG tablet  Take 1 tablet (81 mg total) by mouth daily.     .  furosemide (LASIX) 40 MG tablet  Take 60 mg by mouth 2 (two) times daily.     .  metoprolol tartrate (LOPRESSOR) 25 MG tablet  Take 25 mg by mouth 2 (two) times daily.     .  Multiple Vitamin (MULTIVITAMIN) tablet  Take 1 tablet by mouth daily.     .  potassium chloride SA (K-DUR,KLOR-CON) 20 MEQ tablet  Take 1 tablet by mouth 2 (two) times daily.     .  pravastatin (PRAVACHOL) 20 MG tablet  Take 20 mg by mouth daily.     .  solifenacin (VESICARE) 5 MG tablet  Take 5 mg by mouth daily.     Marland Kitchen  telmisartan (MICARDIS) 40 MG tablet  Take  40 mg by mouth 2 (two) times daily.      No current facility-administered medications for this visit.    Past Surgical History   Procedure  Laterality  Date   .  Total knee arthroplasty     .  Abdominal hysterectomy       Cervical Cancer   .  Parathyroid/thyroid surgery       tumor    Allergies   Allergen  Reactions   .  Indomethacin  Other (See Comments)     dizziness   .  Norvasc [Amlodipine Besylate]  Cough    Family History   Problem  Relation  Age of Onset   .  Heart failure  Father    .  Heart attack  Brother    Social History  Ms. Greeson reports that she has never smoked. She has never used smokeless tobacco.  Ms. Hollibaugh reports that she does not drink alcohol.  Review of Systems  CONSTITUTIONAL: No weight loss, fever, chills, weakness or fatigue.  HEENT: Eyes: No visual loss, blurred vision, double vision or yellow sclerae.No hearing loss, sneezing, congestion, runny nose or sore throat.  SKIN: No rash or itching.  CARDIOVASCULAR:  RESPIRATORY: No shortness of breath, cough or sputum.  GASTROINTESTINAL: No anorexia, nausea, vomiting or diarrhea. No abdominal pain or blood.  GENITOURINARY: No burning on urination, no polyuria  NEUROLOGICAL: No headache, dizziness, syncope, paralysis, ataxia,  numbness or tingling in the extremities. No change in bowel or bladder control.  MUSCULOSKELETAL: No muscle, back pain, joint pain or stiffness.  LYMPHATICS: No enlarged nodes. No history of splenectomy.  PSYCHIATRIC: No history of depression or anxiety.  ENDOCRINOLOGIC: No reports of sweating, cold or heat intolerance. No polyuria or polydipsia.  Marland Kitchen  Physical Examination  There were no vitals filed for this visit.  There were no vitals filed for this visit.  Gen: resting comfortably, no acute distress  HEENT: no scleral icterus, pupils equal round and reactive, no palptable cervical adenopathy,  CV  Resp: Clear to auscultation bilaterally  GI: abdomen is soft, non-tender, non-distended, normal bowel sounds, no hepatosplenomegaly  MSK: extremities are warm, no edema.  Skin: warm, no rash  Neuro: no focal deficits  Psych: appropriate affect  Diagnostic Studies  02/2011 Echo  LVEF 60-65%, abnormal diastolic function, mild MR, PASP 50 mmHg.  Assessment and Plan  1. Chronic diastolic heart failure  - recent admission for volume overload, her volume status looks normal today  - continue bp control and current diuretic dosing  2. Mitral regurgitation  - new diagnosis for the patient, per report rated as moderate to severe. We have requested the echo images to review, she may need further evaluation with TEE pending review  3. Afib  - new diagnosis, no significant symptoms on rate control strategy. She was also started on xarelto for anticoagulation and is tolerating well  - continue current medications.  F/u 3-4 weeks to readdress mitral regurgitation and possible need for TEE  Echo images reviewed. In most views consistent with moderate regurgitation, in the apical 2 chamber view the jet is more impressive, there appears to be 2 centrally directed regurgitant jets in this view, the fact that they are both centrally directed makes the overall color jet appear more impressive. Give her  presentation with heart failure, the valve needs closer evaluation. We will plan a TEE.  [name], M.D., F.A.C.C.

## 2013-04-06 NOTE — Procedures (Signed)
Patient brought to endoscopy suite after appropriate consent obtained. Oropharnynx anesthestized with viscous lidocaine and cetacaine spray. She received a total of 2.5 mg of versed and 100 mcg of fentanyl for moderate sedation throughout the procedure. The TEE probe was intubated into the esophagus without troubles. She tolerated the procedure well without complications. Follow up offical echo report, she has 2 separate what appears to be moderate mitral regugitant jets. Will review hemodynamics further in the official report.   Carlyle Dolly MD

## 2013-04-06 NOTE — Progress Notes (Signed)
*  PRELIMINARY RESULTS* Echocardiogram Echocardiogram Transesophageal has been performed.  [place], [place] 04/06/2013, 2:04 PM

## 2013-04-06 NOTE — Discharge Instructions (Signed)
Transesophageal Echocardiography Transesophageal echocardiography (TEE) is a special type of test that produces images of the heart by using sound waves (echocardiogram). This type of echocardiography can obtain better images of the heart than standard echocardiography. TEE is done by passing a flexible tube down the esophagus. The heart is located in front of the esophagus. Because the heart and esophagus are close to one another, your health care provider can take very clear, detailed pictures of the heart via ultrasound waves. TEE may be done:  If your health care provider needs more information based on standard echocardiography findings.  If you had a stroke. This might have happened because a clot formed in your heart. TEE can visualize different areas of the heart and check for clots.  To check valve anatomy and function.  To check for infection on the inside of your heart (endocarditis).  To evaluate the dividing wall (septum) of the heart and presence of a hole that did not close after birth (patent foramen ovale or atrial septal defect).  To help diagnose a tear in the wall of the aorta (aortic dissection).  During cardiac valve surgery. This allows the surgeon to assess the valve repair before closing the chest.  During a variety of other cardiac procedures to guide positioning of catheters.  Sometimes before a cardioversion, which is a shock to convert heart rhythm back to normal. LET [facility] CARE PROVIDER KNOW ABOUT:   Any allergies you have.  All medicines you are taking, including vitamins, herbs, eye drops, creams, and over-the-counter medicines.  Previous problems you or members of your family have had with the use of anesthetics.  Any blood disorders you have.  Previous surgeries you have had.  Medical conditions you have.  Swallowing difficulties.  An esophageal obstruction. RISKS AND COMPLICATIONS  Generally, TEE is a safe procedure. However, as with any  procedure, complications can occur. Possible complications include an esophageal tear (rupture). BEFORE THE PROCEDURE   Do not eat or drink for 6 hours before the procedure or as directed by your health care provider.  Arrange for someone to drive you home after the procedure. Do not drive yourself home. During the procedure, you will be given medicines that can continue to make you feel drowsy and can impair your reflexes.  An IV access tube will be started in the arm. PROCEDURE   A medicine to help you relax (sedative) will be given through the IV access tube.  A medicine that numbs the area (local anesthetic) may be sprayed in the back of the throat.  Your blood pressure, heart rate, and breathing (vital signs) will be monitored during the procedure.  The TEE probe is a long, flexible tube. The tip of the probe is placed into the back of the mouth, and you will be asked to swallow. This helps to pass the tip of the probe into the esophagus. Once the tip of the probe is in the correct area, your health care provider can take pictures of the heart.  TEE is usually not a painful procedure. You may feel the probe press against the back of the throat. The probe does not enter the trachea and does not affect your breathing.  Your time spent at the hospital is usually less than 2 hours. AFTER THE PROCEDURE   You will be in bed, resting, until you have fully returned to consciousness.  When you first awaken, your throat may feel slightly sore and will probably still feel numb. This will  improve slowly over time.  You will not be allowed to eat or drink until it is clear that the numbness has improved.  Once you have been able to drink, urinate, and sit on the edge of the bed without feeling sick to your stomach (nausea) or dizzy, you may be cleared to go home.  You should have a friend or family member with you for the next 24 hours after your procedure. Document Released: 05/15/2002  Document Revised: 12/13/2012 Document Reviewed: 08/24/2012 [facility] Patient Information 2014 [place], [place].

## 2013-04-09 ENCOUNTER — Encounter (HOSPITAL_COMMUNITY): Payer: Self-pay | Admitting: Cardiology

## 2013-04-11 ENCOUNTER — Telehealth: Payer: Self-pay | Admitting: Cardiology

## 2013-04-11 NOTE — Telephone Encounter (Signed)
Called and left message for pt to return call. MD has requested pt be seen one day this week to discuss TEE results and plan.

## 2013-04-11 NOTE — Telephone Encounter (Signed)
Spoke with pt and schedule appt for Thursday 2-26 at 10:40.

## 2013-04-23 ENCOUNTER — Encounter (HOSPITAL_COMMUNITY): Payer: Self-pay | Admitting: Emergency Medicine

## 2013-04-23 ENCOUNTER — Emergency Department (HOSPITAL_COMMUNITY): Payer: Medicare HMO

## 2013-04-23 ENCOUNTER — Inpatient Hospital Stay (HOSPITAL_COMMUNITY)
Admission: EM | Admit: 2013-04-23 | Discharge: 2013-04-26 | DRG: 216 | Disposition: A | Discharge status: Home Health | Payer: Medicare HMO | Attending: Family Medicine | Admitting: Family Medicine

## 2013-04-23 DIAGNOSIS — J449 Chronic obstructive pulmonary disease, unspecified: Secondary | ICD-10-CM | POA: Diagnosis present

## 2013-04-23 DIAGNOSIS — K560 Paralytic ileus: Secondary | ICD-10-CM | POA: Diagnosis not present

## 2013-04-23 DIAGNOSIS — E872 Acidosis, unspecified: Secondary | ICD-10-CM | POA: Diagnosis not present

## 2013-04-23 DIAGNOSIS — E875 Hyperkalemia: Secondary | ICD-10-CM | POA: Diagnosis not present

## 2013-04-23 DIAGNOSIS — R040 Epistaxis: Secondary | ICD-10-CM | POA: Diagnosis not present

## 2013-04-23 DIAGNOSIS — D62 Acute posthemorrhagic anemia: Secondary | ICD-10-CM | POA: Diagnosis not present

## 2013-04-23 DIAGNOSIS — I340 Nonrheumatic mitral (valve) insufficiency: Secondary | ICD-10-CM | POA: Diagnosis present

## 2013-04-23 DIAGNOSIS — Z8541 Personal history of malignant neoplasm of cervix uteri: Secondary | ICD-10-CM

## 2013-04-23 DIAGNOSIS — Z888 Allergy status to other drugs, medicaments and biological substances status: Secondary | ICD-10-CM

## 2013-04-23 DIAGNOSIS — I472 Ventricular tachycardia, unspecified: Secondary | ICD-10-CM | POA: Diagnosis not present

## 2013-04-23 DIAGNOSIS — N179 Acute kidney failure, unspecified: Secondary | ICD-10-CM | POA: Diagnosis not present

## 2013-04-23 DIAGNOSIS — I10 Essential (primary) hypertension: Secondary | ICD-10-CM

## 2013-04-23 DIAGNOSIS — Z8249 Family history of ischemic heart disease and other diseases of the circulatory system: Secondary | ICD-10-CM

## 2013-04-23 DIAGNOSIS — R32 Unspecified urinary incontinence: Secondary | ICD-10-CM | POA: Diagnosis present

## 2013-04-23 DIAGNOSIS — I4891 Unspecified atrial fibrillation: Secondary | ICD-10-CM

## 2013-04-23 DIAGNOSIS — Z79899 Other long term (current) drug therapy: Secondary | ICD-10-CM

## 2013-04-23 DIAGNOSIS — I5033 Acute on chronic diastolic (congestive) heart failure: Principal | ICD-10-CM | POA: Diagnosis present

## 2013-04-23 DIAGNOSIS — I5030 Unspecified diastolic (congestive) heart failure: Secondary | ICD-10-CM

## 2013-04-23 DIAGNOSIS — K929 Disease of digestive system, unspecified: Secondary | ICD-10-CM | POA: Diagnosis not present

## 2013-04-23 DIAGNOSIS — Z96659 Presence of unspecified artificial knee joint: Secondary | ICD-10-CM

## 2013-04-23 DIAGNOSIS — L270 Generalized skin eruption due to drugs and medicaments taken internally: Secondary | ICD-10-CM | POA: Diagnosis present

## 2013-04-23 DIAGNOSIS — E785 Hyperlipidemia, unspecified: Secondary | ICD-10-CM | POA: Diagnosis present

## 2013-04-23 DIAGNOSIS — I080 Rheumatic disorders of both mitral and aortic valves: Secondary | ICD-10-CM | POA: Diagnosis present

## 2013-04-23 DIAGNOSIS — N19 Unspecified kidney failure: Secondary | ICD-10-CM

## 2013-04-23 DIAGNOSIS — R4701 Aphasia: Secondary | ICD-10-CM | POA: Diagnosis not present

## 2013-04-23 DIAGNOSIS — Z8673 Personal history of transient ischemic attack (TIA), and cerebral infarction without residual deficits: Secondary | ICD-10-CM

## 2013-04-23 DIAGNOSIS — N189 Chronic kidney disease, unspecified: Secondary | ICD-10-CM | POA: Diagnosis present

## 2013-04-23 DIAGNOSIS — I129 Hypertensive chronic kidney disease with stage 1 through stage 4 chronic kidney disease, or unspecified chronic kidney disease: Secondary | ICD-10-CM

## 2013-04-23 DIAGNOSIS — Z7982 Long term (current) use of aspirin: Secondary | ICD-10-CM

## 2013-04-23 DIAGNOSIS — R609 Edema, unspecified: Secondary | ICD-10-CM

## 2013-04-23 DIAGNOSIS — J9600 Acute respiratory failure, unspecified whether with hypoxia or hypercapnia: Secondary | ICD-10-CM | POA: Diagnosis present

## 2013-04-23 DIAGNOSIS — J4489 Other specified chronic obstructive pulmonary disease: Secondary | ICD-10-CM | POA: Diagnosis present

## 2013-04-23 DIAGNOSIS — I498 Other specified cardiac arrhythmias: Secondary | ICD-10-CM | POA: Diagnosis present

## 2013-04-23 DIAGNOSIS — I4729 Other ventricular tachycardia: Secondary | ICD-10-CM | POA: Diagnosis not present

## 2013-04-23 DIAGNOSIS — R5381 Other malaise: Secondary | ICD-10-CM | POA: Diagnosis present

## 2013-04-23 DIAGNOSIS — I2789 Other specified pulmonary heart diseases: Secondary | ICD-10-CM | POA: Diagnosis present

## 2013-04-23 DIAGNOSIS — E876 Hypokalemia: Secondary | ICD-10-CM | POA: Diagnosis not present

## 2013-04-23 DIAGNOSIS — F411 Generalized anxiety disorder: Secondary | ICD-10-CM | POA: Diagnosis present

## 2013-04-23 DIAGNOSIS — Y832 Surgical operation with anastomosis, bypass or graft as the cause of abnormal reaction of the patient, or of later complication, without mention of misadventure at the time of the procedure: Secondary | ICD-10-CM | POA: Diagnosis present

## 2013-04-23 DIAGNOSIS — I509 Heart failure, unspecified: Secondary | ICD-10-CM

## 2013-04-23 DIAGNOSIS — Z6835 Body mass index (BMI) 35.0-35.9, adult: Secondary | ICD-10-CM

## 2013-04-23 DIAGNOSIS — M1710 Unilateral primary osteoarthritis, unspecified knee: Secondary | ICD-10-CM | POA: Diagnosis present

## 2013-04-23 DIAGNOSIS — E669 Obesity, unspecified: Secondary | ICD-10-CM | POA: Diagnosis present

## 2013-04-23 DIAGNOSIS — T502X5A Adverse effect of carbonic-anhydrase inhibitors, benzothiadiazides and other diuretics, initial encounter: Secondary | ICD-10-CM | POA: Diagnosis present

## 2013-04-23 LAB — COMPREHENSIVE METABOLIC PANEL
ALBUMIN: 3.1 g/dL — AB (ref 3.5–5.2)
ALT: 22 U/L (ref 0–35)
AST: 23 U/L (ref 0–37)
Alkaline Phosphatase: 106 U/L (ref 39–117)
BUN: 20 mg/dL (ref 6–23)
CALCIUM: 9.3 mg/dL (ref 8.4–10.5)
CO2: 33 mEq/L — ABNORMAL HIGH (ref 19–32)
CREATININE: 1.1 mg/dL (ref 0.50–1.10)
Chloride: 99 mEq/L (ref 96–112)
GFR calc Af Amer: 57 mL/min — ABNORMAL LOW (ref >90)
GFR calc non Af Amer: 49 mL/min — ABNORMAL LOW (ref >90)
Glucose, Bld: 111 mg/dL — ABNORMAL HIGH (ref 70–99)
Potassium: 4.1 mEq/L (ref 3.7–5.3)
Sodium: 141 mEq/L (ref 137–147)
TOTAL PROTEIN: 6.5 g/dL (ref 6.0–8.3)
Total Bilirubin: 0.4 mg/dL (ref 0.3–1.2)

## 2013-04-23 LAB — TROPONIN I: Troponin I: <0.3 ng/mL (ref <0.30)

## 2013-04-23 LAB — CBC WITH DIFFERENTIAL/PLATELET
Basophils Absolute: 0 10*3/uL (ref 0.0–0.1)
Basophils Relative: 0 % (ref 0–1)
EOS ABS: 0.1 10*3/uL (ref 0.0–0.7)
EOS PCT: 1 % (ref 0–5)
HCT: 36.8 % (ref 36.0–46.0)
HEMOGLOBIN: 12.5 g/dL (ref 12.0–15.0)
LYMPHS ABS: 1.7 10*3/uL (ref 0.7–4.0)
Lymphocytes Relative: 22 % (ref 12–46)
MCH: 32.5 pg (ref 26.0–34.0)
MCHC: 34 g/dL (ref 30.0–36.0)
MCV: 95.6 fL (ref 78.0–100.0)
MONOS PCT: 8 % (ref 3–12)
Monocytes Absolute: 0.6 10*3/uL (ref 0.1–1.0)
Neutro Abs: 5.3 10*3/uL (ref 1.7–7.7)
Neutrophils Relative %: 69 % (ref 43–77)
Platelets: 194 10*3/uL (ref 150–400)
RBC: 3.85 MIL/uL — ABNORMAL LOW (ref 3.87–5.11)
RDW: 13.1 % (ref 11.5–15.5)
WBC: 7.7 10*3/uL (ref 4.0–10.5)

## 2013-04-23 LAB — CBC
HCT: 42.1 % (ref 36.0–46.0)
Hemoglobin: 14.3 g/dL (ref 12.0–15.0)
MCH: 32.5 pg (ref 26.0–34.0)
MCHC: 34 g/dL (ref 30.0–36.0)
MCV: 95.7 fL (ref 78.0–100.0)
PLATELETS: 197 10*3/uL (ref 150–400)
RBC: 4.4 MIL/uL (ref 3.87–5.11)
RDW: 13 % (ref 11.5–15.5)
WBC: 7.8 10*3/uL (ref 4.0–10.5)

## 2013-04-23 LAB — BASIC METABOLIC PANEL
BUN: 21 mg/dL (ref 6–23)
CHLORIDE: 99 meq/L (ref 96–112)
CO2: 28 mEq/L (ref 19–32)
Calcium: 9.5 mg/dL (ref 8.4–10.5)
Creatinine, Ser: 1.17 mg/dL — ABNORMAL HIGH (ref 0.50–1.10)
GFR calc non Af Amer: 45 mL/min — ABNORMAL LOW (ref >90)
GFR, EST AFRICAN AMERICAN: 53 mL/min — AB (ref >90)
Glucose, Bld: 168 mg/dL — ABNORMAL HIGH (ref 70–99)
POTASSIUM: 4.2 meq/L (ref 3.7–5.3)
SODIUM: 142 meq/L (ref 137–147)

## 2013-04-23 LAB — PRO B NATRIURETIC PEPTIDE: PRO B NATRI PEPTIDE: 1031 pg/mL — AB (ref 0–125)

## 2013-04-23 LAB — PROTIME-INR
INR: 0.86 (ref 0.00–1.49)
Prothrombin Time: 11.6 seconds (ref 11.6–15.2)

## 2013-04-23 LAB — TSH: TSH: 2.709 u[IU]/mL (ref 0.350–4.500)

## 2013-04-23 LAB — POCT I-STAT TROPONIN I: TROPONIN I, POC: 0 ng/mL (ref 0.00–0.08)

## 2013-04-23 MED ORDER — LOSARTAN POTASSIUM 50 MG PO TABS
50.0000 mg | ORAL_TABLET | Freq: Every day | ORAL | Status: DC
  Administered 2013-04-23: 50 mg via ORAL
  Filled 2013-04-23 (×2): qty 1

## 2013-04-23 MED ORDER — SODIUM CHLORIDE 0.9 % IJ SOLN
3.0000 mL | Freq: Two times a day (BID) | INTRAMUSCULAR | Status: DC
  Administered 2013-04-23 – 2013-04-25 (×6): 3 mL via INTRAVENOUS

## 2013-04-23 MED ORDER — BRIMONIDINE TARTRATE 0.15 % OP SOLN
1.0000 [drp] | Freq: Two times a day (BID) | OPHTHALMIC | Status: DC
  Filled 2013-04-23: qty 5

## 2013-04-23 MED ORDER — POTASSIUM CHLORIDE CRYS ER 20 MEQ PO TBCR
20.0000 meq | EXTENDED_RELEASE_TABLET | Freq: Two times a day (BID) | ORAL | Status: DC
  Administered 2013-04-23 (×2): 20 meq via ORAL
  Filled 2013-04-23 (×4): qty 1

## 2013-04-23 MED ORDER — DARIFENACIN HYDROBROMIDE ER 7.5 MG PO TB24
7.5000 mg | ORAL_TABLET | Freq: Every day | ORAL | Status: DC
  Administered 2013-04-23 – 2013-04-26 (×4): 7.5 mg via ORAL
  Filled 2013-04-23 (×4): qty 1

## 2013-04-23 MED ORDER — LATANOPROST 0.005 % OP SOLN
1.0000 [drp] | Freq: Every day | OPHTHALMIC | Status: DC
  Filled 2013-04-23: qty 2.5

## 2013-04-23 MED ORDER — SIMVASTATIN 10 MG PO TABS
10.0000 mg | ORAL_TABLET | Freq: Every day | ORAL | Status: DC
Start: 2013-04-23 — End: 2013-04-26
  Administered 2013-04-23 – 2013-04-25 (×3): 10 mg via ORAL
  Filled 2013-04-23 (×4): qty 1

## 2013-04-23 MED ORDER — ENOXAPARIN SODIUM 40 MG/0.4ML ~~LOC~~ SOLN
40.0000 mg | SUBCUTANEOUS | Status: DC
  Administered 2013-04-23 – 2013-04-26 (×4): 40 mg via SUBCUTANEOUS
  Filled 2013-04-23 (×4): qty 0.4

## 2013-04-23 MED ORDER — FUROSEMIDE 10 MG/ML IJ SOLN
60.0000 mg | Freq: Two times a day (BID) | INTRAMUSCULAR | Status: DC
  Administered 2013-04-23 – 2013-04-24 (×3): 60 mg via INTRAVENOUS
  Filled 2013-04-23 (×6): qty 6

## 2013-04-23 MED ORDER — ACETAMINOPHEN 650 MG RE SUPP: 650.0000 mg | Freq: Four times a day (QID) | RECTAL | Status: DC | PRN

## 2013-04-23 MED ORDER — FUROSEMIDE 10 MG/ML IJ SOLN
40.0000 mg | Freq: Once | INTRAMUSCULAR | Status: AC
  Administered 2013-04-23: 40 mg via INTRAVENOUS
  Filled 2013-04-23: qty 4

## 2013-04-23 MED ORDER — ACETAMINOPHEN 325 MG PO TABS
650.0000 mg | ORAL_TABLET | Freq: Four times a day (QID) | ORAL | Status: DC | PRN
  Administered 2013-04-23 – 2013-04-26 (×3): 650 mg via ORAL
  Filled 2013-04-23 (×4): qty 2

## 2013-04-23 MED ORDER — SODIUM CHLORIDE 0.9 % IJ SOLN
3.0000 mL | Freq: Two times a day (BID) | INTRAMUSCULAR | Status: DC
  Administered 2013-04-23 – 2013-04-26 (×7): 3 mL via INTRAVENOUS

## 2013-04-23 MED ORDER — ASPIRIN EC 81 MG PO TBEC
81.0000 mg | DELAYED_RELEASE_TABLET | Freq: Every day | ORAL | Status: DC
  Administered 2013-04-23 – 2013-04-26 (×4): 81 mg via ORAL
  Filled 2013-04-23 (×5): qty 1

## 2013-04-23 MED ORDER — METOPROLOL TARTRATE 25 MG PO TABS
25.0000 mg | ORAL_TABLET | Freq: Two times a day (BID) | ORAL | Status: DC
  Administered 2013-04-23 – 2013-04-26 (×7): 25 mg via ORAL
  Filled 2013-04-23 (×8): qty 1

## 2013-04-23 MED ORDER — ONDANSETRON HCL 4 MG/2ML IJ SOLN: 4.0000 mg | Freq: Four times a day (QID) | INTRAMUSCULAR | Status: DC | PRN

## 2013-04-23 MED ORDER — ONDANSETRON HCL 4 MG PO TABS
4.0000 mg | ORAL_TABLET | Freq: Four times a day (QID) | ORAL | Status: DC | PRN
Start: 2013-04-23 — End: 2013-04-26

## 2013-04-23 MED ORDER — BRIMONIDINE TARTRATE 0.2 % OP SOLN
1.0000 [drp] | Freq: Two times a day (BID) | OPHTHALMIC | Status: DC
  Administered 2013-04-23: 1 [drp] via OPHTHALMIC
  Filled 2013-04-23: qty 5

## 2013-04-23 MED ORDER — BIOTENE DRY MOUTH MT LIQD
15.0000 mL | Freq: Two times a day (BID) | OROMUCOSAL | Status: DC
  Administered 2013-04-23 – 2013-04-26 (×7): 15 mL via OROMUCOSAL

## 2013-04-23 NOTE — ED Notes (Signed)
Presents with sudden onset of SOB began at 23:45 this evening, tried to wait it out was unable. EMS arrived and SAts on RA 60s, placed on CPAP @5  Sats increased to 90s. HX of CHF. Pt reports edema to bilateral legs. Given 5 of Albuterol by EMS and 0.4 SL nitro.

## 2013-04-23 NOTE — Progress Notes (Signed)
Pt seen and examined, admitted this am with Acute Diastolic CHF Continue IV lasix 60mg  q12 Continue home meds  Domenic Polite, MD [phone]

## 2013-04-23 NOTE — Progress Notes (Signed)
UR Completed Steffon Gladu Graves-Bigelow, RN,BSN [phone]  

## 2013-04-23 NOTE — ED Provider Notes (Signed)
CSN: [id number]     Arrival date & time 04/23/13  0143 History   First MD Initiated Contact with Patient 04/23/13 0144     Chief Complaint  Patient presents with  . Shortness of Breath     (Consider location/radiation/quality/duration/timing/severity/associated sxs/prior Treatment) Patient is a 73 y.o. female presenting with shortness of breath. The history is provided by the patient.  Shortness of Breath She noticed that she was short of breath when she began to go to bed tonight. She denies any dyspnea during the course of the day and she denies any dyspnea on going to bed yesterday. EMS arrived noting severe hypoxia with oxygen saturation of 60% and placed her on BiPAP. The patient thinks that she might have too much fluid in her system. She states that her legs are swollen but unchanged from what they normally are. She doesn't not to having heat and some things with too much salt. She denies chest pain, heaviness, tightness, pressure. She denies nausea or vomiting.  Past Medical History  Diagnosis Date  . Congestive heart failure, unspecified   . Chronic airway obstruction, not elsewhere classified   . Precordial pain   . Swelling of limb   . Dizziness and giddiness   . Unspecified transient cerebral ischemia     Diag. 2003  . Acute, but ill-defined, cerebrovascular disease     2000 [place]  . Shortness of breath   . Obesity   . Juvenile rheumatic fever     age 52  . Unspecified essential hypertension   . Hyperlipidemia    Past Surgical History  Procedure Laterality Date  . Total knee arthroplasty    . Abdominal hysterectomy      Cervical Cancer  . Parathyroid/thyroid surgery      tumor  . Tee without cardioversion N/A 04/06/2013    Procedure: TRANSESOPHAGEAL ECHOCARDIOGRAM (TEE);  Surgeon: [name], MD;  [facility];  Service: Cardiology;  Laterality: N/A;   Family History  Problem Relation Age of Onset  . Heart failure Father   . Heart attack Brother     History  Substance Use Topics  . Smoking status: Never Smoker   . Smokeless tobacco: Never Used  . Alcohol Use: No   OB History   Grav Para Term Preterm Abortions TAB SAB Ect Mult Living                 Review of Systems  Respiratory: Positive for shortness of breath.   All other systems reviewed and are negative.      Allergies  Indomethacin and Norvasc  Home Medications   Current Outpatient Rx  Name  Route  Sig  Dispense  Refill  . aspirin EC 325 MG tablet   Oral   Take 325 mg by mouth daily.         . furosemide (LASIX) 40 MG tablet   Oral   Take 1.5 tablets (60 mg total) by mouth 2 (two) times daily.   90 tablet   6   . metoprolol tartrate (LOPRESSOR) 25 MG tablet   Oral   Take 1 tablet (25 mg total) by mouth 2 (two) times daily.   60 tablet   6   . Multiple Vitamin (MULTIVITAMIN) tablet   Oral   Take 1 tablet by mouth daily.         . potassium chloride SA (K-DUR,KLOR-CON) 20 MEQ tablet   Oral   Take 1 tablet (20 mEq total) by mouth 2 (  two) times daily.   60 tablet   6   . pravastatin (PRAVACHOL) 20 MG tablet   Oral   Take 20 mg by mouth at bedtime.          . solifenacin (VESICARE) 5 MG tablet   Oral   Take 5 mg by mouth daily.           Marland Kitchen telmisartan (MICARDIS) 40 MG tablet   Oral   Take 40 mg by mouth 2 (two) times daily.           Pulse 135  Resp 23  SpO2 100% Physical Exam  Nursing note and vitals reviewed.  73 year old female, on BiPAP, and in no acute distress. Vital signs are significant for tachycardia with heart rate 135, and tachypnea with respiratory rate of 23. Oxygen saturation is 100%, which is normal. Head is normocephalic and atraumatic. PERRLA, EOMI. Oropharynx is clear. Neck is nontender and supple without adenopathy or JVD. Back is nontender and there is no CVA tenderness. Lungs have coarse expiratory wheezes consistent with cardiac asthma. Chest is nontender. Heart has regular rate and rhythm without  murmur. Abdomen is soft, flat, nontender without masses or hepatosplenomegaly and peristalsis is normoactive. Extremities have 2-3+ edema, venous stasis changes. Skin is warm and dry without rash. Neurologic: Mental status is normal, cranial nerves are intact, there are no motor or sensory deficits.  ED Course  Procedures (including critical care time) Labs Review Results for orders placed during the hospital encounter of 04/23/13  CBC      Result Value Ref Range   WBC 7.8  4.0 - 10.5 K/uL   RBC 4.40  3.87 - 5.11 MIL/uL   Hemoglobin 14.3  12.0 - 15.0 g/dL   HCT 42.1  36.0 - 46.0 %   MCV 95.7  78.0 - 100.0 fL   MCH 32.5  26.0 - 34.0 pg   MCHC 34.0  30.0 - 36.0 g/dL   RDW 13.0  11.5 - 15.5 %   Platelets 197  150 - 400 K/uL  BASIC METABOLIC PANEL      Result Value Ref Range   Sodium 142  137 - 147 mEq/L   Potassium 4.2  3.7 - 5.3 mEq/L   Chloride 99  96 - 112 mEq/L   CO2 28  19 - 32 mEq/L   Glucose, Bld 168 (*) 70 - 99 mg/dL   BUN 21  6 - 23 mg/dL   Creatinine, Ser 1.17 (*) 0.50 - 1.10 mg/dL   Calcium 9.5  8.4 - 10.5 mg/dL   GFR calc non Af Amer 45 (*) >90 mL/min   GFR calc Af Amer 53 (*) >90 mL/min  PRO B NATRIURETIC PEPTIDE      Result Value Ref Range   Pro B Natriuretic peptide (BNP) 1031.0 (*) 0 - 125 pg/mL  PROTIME-INR      Result Value Ref Range   Prothrombin Time 11.6  11.6 - 15.2 seconds   INR 0.86  0.00 - 1.49  POCT I-STAT TROPONIN I      Result Value Ref Range   Troponin i, poc 0.00  0.00 - 0.08 ng/mL   Comment 3            Imaging Review Dg Chest Port 1 View  04/23/2013   CLINICAL DATA:  Shortness of breath.  EXAM: PORTABLE CHEST - 1 VIEW  COMPARISON:  03/20/2013  FINDINGS: Cardiomegaly. Bilateral airspace opacities are noted, right greater than left. This likely reflects  asymmetric edema. Cannot exclude infection. No visible effusions. No acute bony abnormality.  IMPRESSION: Cardiomegaly with vascular congestion and asymmetric bilateral airspace disease, right  greater than left. I favor this represents asymmetric edema although infection is not excluded.   Electronically Signed   By: Rolm Baptise M.D.   On: 04/23/2013 02:25    EKG Interpretation    Date/Time:  Monday April 23 2013 01:52:52 EST Ventricular Rate:  153 PR Interval:    QRS Duration: 114 QT Interval:  345 QTC Calculation: 550 R Axis:   -36 Text Interpretation:  Multifocal atrial tachycardia Incomplete left bundle branch block LVH with secondary repolarization abnormality When compared with ECG of 03/20/2013, Multifocal atrial tachycardia has replaced Sinus bradycardia Confirmed by Roxanne Mins  MD, Kayda Allers ([id number]) on 04/23/2013 2:02:08 AM           CRITICAL CARE Performed by: AJOIN,OMVEH Total critical care time: 40 minutes Critical care time was exclusive of separately billable procedures and treating other patients. Critical care was necessary to treat or prevent imminent or life-threatening deterioration. Critical care was time spent personally by me on the following activities: development of treatment plan with patient and/or surrogate as well as nursing, discussions with consultants, evaluation of patient's response to treatment, examination of patient, obtaining history from patient or surrogate, ordering and performing treatments and interventions, ordering and review of laboratory studies, ordering and review of radiographic studies, pulse oximetry and re-evaluation of patient's condition.  MDM   Final diagnoses:  CHF exacerbation    Dyspnea which is most likely CHF exacerbation. She'll be given furosemide. Old records are reviewed and she does have a history of diastolic congestive heart failure.  She feels better after furosemide. She is wearing an adult diaper and has urinated several times. She was taken off of BiPAP and is tolerating it well. Current oxygen saturations are in the 90s albeit with supplemental oxygen. Chest x-ray looks like CHF although BNP is only  modestly elevated. Case is discussed with Dr. Hal Hope of [facility] who agrees to admit the patient.  Delora Fuel, MD 20/94/70 [id number]

## 2013-04-23 NOTE — ED Notes (Signed)
Pt states she is incontinent. Adult brief soaked, and removed. Pt placed on bedside commode and adult brief provided.

## 2013-04-23 NOTE — Progress Notes (Signed)
*  Preliminary Results* Bilateral lower extremity venous duplex completed. Bilateral lower extremities are negative for deep vein thrombosis. There is no evidence of Baker's cyst bilaterally.  04/23/2013 9:07 AM  Maudry Mayhew, RVT, RDCS, RDMS

## 2013-04-23 NOTE — Progress Notes (Signed)
Pt admitted from the ed with chf, pt is alertx4, vital signs are stable, oxygen at 2L=98%, palced on the heart monitor, NSR-SB. W ill continue to monitor---Sartaj Hoskin, rn

## 2013-04-23 NOTE — H&P (Addendum)
[facility] History and Physical  [name] [name] [id number] DOB: [birth year] DOA: 04/23/2013  Referring physician: ER physician. PCP: [name], MD   Chief Complaint: Shortness of breath.  HPI: [name] [name] is a 73 y.o. female with history of diastolic CHF last EF measured in April 06, 2013 was 60-65%, atrial fibrillation, mitral regurgitation presented to the ER because of sudden onset of shortness of breath last night while sleeping. Denies any palpitation or chest pain has had mild nonproductive cough. In the ER patient was initially placed on BiPAP and patient's breathing improved after IV Lasix and BiPAP was weaned off. Patient at this time feels better. Patient has gained increasing weight over the last few days with more swelling on the left lower extremity than the right. Patient was recently diagnosed with new onset atrial fibrillation last month and was placed on xarelto which patient stopped taking because of nasal bleed. Patient's daughter states that patient has been coming to the ER at [facility] every month for last 4 months due to shortness of breath. Patient denies any nausea vomiting abdominal pain diarrhea focal deficits headache.   Review of Systems: As presented in the history of presenting illness, rest negative.  Past Medical History  Diagnosis Date  . Congestive heart failure, unspecified   . Chronic airway obstruction, not elsewhere classified   . Precordial pain   . Swelling of limb   . Dizziness and giddiness   . Unspecified transient cerebral ischemia     Diag. 2003  . Acute, but ill-defined, cerebrovascular disease     2000 [place]  . Shortness of breath   . Obesity   . Juvenile rheumatic fever     age 25  . Unspecified essential hypertension   . Hyperlipidemia    Past Surgical History  Procedure Laterality Date  . Total knee arthroplasty    . Abdominal hysterectomy      Cervical Cancer  . Parathyroid/thyroid surgery       tumor  . Tee without cardioversion N/A 04/06/2013    Procedure: TRANSESOPHAGEAL ECHOCARDIOGRAM (TEE);  Surgeon: [name], MD;  Location[facility];  Service: Cardiology;  Laterality: N/A;   Social History:  reports that she has never smoked. She has never used smokeless tobacco. She reports that she does not drink alcohol or use illicit drugs. Where does patient live home. Can patient participate in ADLs? Yes.  Allergies  Allergen Reactions  . Indomethacin Other (See Comments)    dizziness  . Norvasc [Amlodipine Besylate] Cough    Family History:  Family History  Problem Relation Age of Onset  . Heart failure Father   . Heart attack Brother       Prior to Admission medications   Medication Sig Start Date End Date Taking? Authorizing Provider  acetaminophen (TYLENOL) 500 MG tablet Take 500 mg by mouth every 8 (eight) hours as needed for moderate pain.   Yes Historical Provider, MD  aspirin EC 81 MG tablet Take 81 mg by mouth daily.   Yes Historical Provider, MD  Calcium Carbonate-Vitamin D (CALTRATE 600+D PO) Take 1 tablet by mouth daily. Soft chew   Yes Historical Provider, MD  furosemide (LASIX) 40 MG tablet Take 1.5 tablets (60 mg total) by mouth 2 (two) times daily. 03/07/13  Yes [name], MD  losartan (COZAAR) 50 MG tablet Take 50 mg by mouth daily.   Yes Historical Provider, MD  metoprolol tartrate (LOPRESSOR) 25 MG tablet Take 1 tablet (25 mg  total) by mouth 2 (two) times daily. 03/07/13  Yes [name], MD  potassium chloride SA (K-DUR,KLOR-CON) 20 MEQ tablet Take 1 tablet (20 mEq total) by mouth 2 (two) times daily. 03/07/13  Yes [name], MD  pravastatin (PRAVACHOL) 20 MG tablet Take 20 mg by mouth at bedtime.    Yes Historical Provider, MD  solifenacin (VESICARE) 5 MG tablet Take 5 mg by mouth daily.     Yes Historical Provider, MD    Physical Exam: Filed Vitals:   04/23/13 0500 04/23/13 0515 04/23/13 0530 04/23/13 0553  BP:  115/60 113/51 110/71 110/71  Pulse: 59 60 58 62  Temp:   97.8 F (36.6 C) 98.1 F (36.7 C)  TempSrc:   Oral Oral  Resp:   21 22  Height:    5\' 2"  (1.575 m)  Weight:    89.047 kg (196 lb 5 oz)  SpO2: 98% 99% 98% 98%     General:   Well-developed well-nourished.  Eyes: Anicteric no pallor.  ENT: No discharge from the ears eyes nose mouth.  Neck: No mass felt. JVD not appreciated.  Cardiovascular: S1-S2 heard.  Respiratory: No rhonchi or crepitations.  Abdomen: Soft nontender bowel sounds present.  Skin: Chronic skin changes in the lower extremity.  Musculoskeletal: Bilateral lower extremity edema more on the left side.  Psychiatric: Appears normal.  Neurologic: Alert awake oriented to time place and person. Moves all extremities.  Labs on Admission:  Basic Metabolic Panel:  Recent Labs Lab 04/23/13 0158  NA 142  K 4.2  CL 99  CO2 28  GLUCOSE 168*  BUN 21  CREATININE 1.17*  CALCIUM 9.5   Liver Function Tests: No results found for this basename: AST, ALT, ALKPHOS, BILITOT, PROT, ALBUMIN,  in the last 168 hours No results found for this basename: LIPASE, AMYLASE,  in the last 168 hours No results found for this basename: AMMONIA,  in the last 168 hours CBC:  Recent Labs Lab 04/23/13 0158  WBC 7.8  HGB 14.3  HCT 42.1  MCV 95.7  PLT 197   Cardiac Enzymes: No results found for this basename: CKTOTAL, CKMB, CKMBINDEX, TROPONINI,  in the last 168 hours  BNP (last 3 results)  Recent Labs  03/20/13 0324 04/23/13 0155  PROBNP 757.6* 1031.0*   CBG: No results found for this basename: GLUCAP,  in the last 168 hours  Radiological Exams on Admission: Dg Chest Port 1 View  04/23/2013   CLINICAL DATA:  Shortness of breath.  EXAM: PORTABLE CHEST - 1 VIEW  COMPARISON:  03/20/2013  FINDINGS: Cardiomegaly. Bilateral airspace opacities are noted, right greater than left. This likely reflects asymmetric edema. Cannot exclude infection. No visible effusions. No  acute bony abnormality.  IMPRESSION: Cardiomegaly with vascular congestion and asymmetric bilateral airspace disease, right greater than left. I favor this represents asymmetric edema although infection is not excluded.   Electronically Signed   By: Rolm Baptise M.D.   On: 04/23/2013 02:25    EKG: Independently reviewed. A. fib with RVR with LBBB.  Assessment/Plan Principal Problem:   CHF exacerbation Active Problems:   Diastolic heart failure   HTN (hypertension)   A-fib   Mitral regurgitation   Renal failure   CHF (congestive heart failure)   1. Decompensated diastolic heart failure last EF measured in April 06, 2013 was 60-65% - patient usually takes Lasix 60 mg by mouth twice a day. I have placed patient on 60 mg IV every 12. Patient did receive  one dose of Lasix in the ER 40 mg IV patient also had taken an additional dose of Lasix at her house and she got short of breath. Closely follow intake output and metabolic panel. Since patient's leg swelling is more on the left side than right check Dopplers to rule out DVT. 2. Atrial fibrillation - initially patient's rate was elevated which was improved after coming to the ER once her respiratory status got better. Continue home medications. Patient does not want to be on anticoagulants as patient has recently experience nasal bleeds. Have explained patient about the risk of stroke despite which she does not want to take anticoagulants. Patient is okay with Lovenox for DVT prophylaxis. On aspirin. 3. History of mitral regurgitation - probably contributing to #1. Per cardiology. 4. Chronic renal failure - closely follow intake output and metabolic panel. 5. Hypertension - continue present medications. 6. Hyperlipidemia - continue statins.  I have reviewed patient's old charts and labs.  Code Status: Full code.  Family Communication: Patient's daughter at the bedside.  Disposition Plan: Admit to inpatient.    KAKRAKANDY,ARSHAD N. [facility] Pager [phone].  If 7PM-7AM, please contact night-coverage [email or web address] Password [email or web address] 04/23/2013, 7:06 AM

## 2013-04-24 DIAGNOSIS — I5030 Unspecified diastolic (congestive) heart failure: Secondary | ICD-10-CM

## 2013-04-24 DIAGNOSIS — N19 Unspecified kidney failure: Secondary | ICD-10-CM

## 2013-04-24 DIAGNOSIS — I059 Rheumatic mitral valve disease, unspecified: Secondary | ICD-10-CM

## 2013-04-24 DIAGNOSIS — I10 Essential (primary) hypertension: Secondary | ICD-10-CM

## 2013-04-24 LAB — BASIC METABOLIC PANEL
BUN: 27 mg/dL — ABNORMAL HIGH (ref 6–23)
CO2: 32 meq/L (ref 19–32)
CREATININE: 1.25 mg/dL — AB (ref 0.50–1.10)
Calcium: 9.2 mg/dL (ref 8.4–10.5)
Chloride: 99 mEq/L (ref 96–112)
GFR calc Af Amer: 49 mL/min — ABNORMAL LOW (ref >90)
GFR calc non Af Amer: 42 mL/min — ABNORMAL LOW (ref >90)
GLUCOSE: 112 mg/dL — AB (ref 70–99)
Potassium: 4.4 mEq/L (ref 3.7–5.3)
SODIUM: 142 meq/L (ref 137–147)

## 2013-04-24 LAB — CBC
HCT: 38.6 % (ref 36.0–46.0)
Hemoglobin: 12.7 g/dL (ref 12.0–15.0)
MCH: 32.1 pg (ref 26.0–34.0)
MCHC: 32.9 g/dL (ref 30.0–36.0)
MCV: 97.5 fL (ref 78.0–100.0)
Platelets: 199 10*3/uL (ref 150–400)
RBC: 3.96 MIL/uL (ref 3.87–5.11)
RDW: 13.4 % (ref 11.5–15.5)
WBC: 5.2 10*3/uL (ref 4.0–10.5)

## 2013-04-24 LAB — TROPONIN I: Troponin I: <0.3 ng/mL (ref <0.30)

## 2013-04-24 MED ORDER — FUROSEMIDE 10 MG/ML IJ SOLN
40.0000 mg | Freq: Every day | INTRAMUSCULAR | Status: DC
  Administered 2013-04-25: 40 mg via INTRAVENOUS
  Filled 2013-04-24: qty 4

## 2013-04-24 MED ORDER — POTASSIUM CHLORIDE CRYS ER 20 MEQ PO TBCR
20.0000 meq | EXTENDED_RELEASE_TABLET | Freq: Every day | ORAL | Status: DC
  Administered 2013-04-24 – 2013-04-26 (×3): 20 meq via ORAL
  Filled 2013-04-24 (×2): qty 1

## 2013-04-24 NOTE — Care Management Note (Signed)
    Page 1 of 2   04/26/2013     4:28:33 PM   CARE MANAGEMENT NOTE 04/26/2013  Patient:  [name] [name], [name] [name]   Account Number:  [id number]  Date Initiated:  04/24/2013  Documentation initiated by:  GRAVES-BIGELOW,Lynx Goodrich  Subjective/Objective Assessment:   Pt admitted for CHF exacerbation. Initiated on IV lasix.     Action/Plan:   CM will continue to monitor for disposition needs. May benefit from [facility] RN when medically stable for d/c.   Anticipated DC Date:  04/26/2013   Anticipated DC Plan:  [place]  CM consult      PAC Choice  [place]   Choice offered to / List presented to:  C-1 Patient   DME arranged  [place]      DME agency  [place] arranged  HH-1 RN  [place].   Status of service:  Completed, signed off Medicare Important Message given?   (If response is "NO", the following Medicare IM given date fields will be blank) Date Medicare IM given:   Date Additional Medicare IM given:    Discharge Disposition:  [place]  Per UR Regulation:  Reviewed for med. necessity/level of care/duration of stay  If discussed at [place] of Stay Meetings, dates discussed:    Comments:  04-24-13 [place], RN,BSN [phone] [facility] to consult with pt.

## 2013-04-24 NOTE — Progress Notes (Signed)
I stopped in to talk with [name] [name] about her heart failure diagnosis.  She lives home with a grandson, his wife and his children.  She says that she has a limited knowledge of heart failure.  I reviewed the "Living with Heart Failure" book including the importance of daily weights, signs and symptoms, when to call the physician, low sodium diet, and fluid restriction.  She admits that she does not have a scale and has not been weighing daily.  She acknowledges understanding of all topics discussed.  I have set her up to see the HF educational video.  I will also plan to see her again before discharge to reinforce education.  She could benefit from [facility] and telehealth at discharge.  I will discuss with care management.  Carole Binning RN, BSN, PCCN--Heart Failure Art therapist

## 2013-04-24 NOTE — Progress Notes (Addendum)
[facility] PROGRESS NOTE  [name] [name] KGY:[id number] DOB: [birth year] DOA: 04/23/2013 PCP: [name], MD Brief Narrative: HPI: [name] [name] is a 73 y.o. female with history of diastolic CHF, EF of 02-63%, atrial fibrillation, mitral regurgitation presented to the ER because of sudden onset of shortness of breath. In the ER patient was initially placed on BiPAP and patient's breathing improved after IV Lasix and BiPAP was weaned off.  Patient has gained increasing weight over the last few days with more swelling on the left lower extremity than the right. Patient was recently diagnosed with new onset atrial fibrillation last month and was placed on xarelto which patient stopped taking because of nasal bleed.  Clinically improving with diuresis   Assessment/Plan: 1. Acute on chronic diastolic heart failure, EF 60-65% -continue IV lasix cut down dose to 40daily due to bump in creatinine -hold ARB today -I/Os inaccurate  2. leg swelling  -likely due to 1 - Dopplers negative for DVT.   3. Atrial fibrillation  -rate controlled, continue metoprolol, ASA -she stopped Xarelto due to recurrent nose bleeds  4. History of mitral regurgitation - probably contributing to #1. Per cardiology.  -per patient Dr.Jonathan Branch her cardiologist did a TEE and she is curious to know what can be one further for her valve. -attempted to contact primary cardiologist, unable to do so today, [place] office closed today due to weather, attempt again tomorrow  5. Mild ARF on CKD -due to diuresis -cut down diuretics, hold ARB -bmet in am  DVT proph: lovenox  Code Status: Full Code Family Communication: none at bedside Disposition Plan:  Home when improved   HPI/Subjective: -Breathing improving -some chest pressure earlier this am resolved   Objective: Filed Vitals:   04/24/13 0534  BP: 125/63  Pulse: 66  Temp: 97.9 F (36.6 C)  Resp: 18    Intake/Output Summary  (Last 24 hours) at 04/24/13 1042 Last data filed at 04/23/13 2111  Gross per 24 hour  Intake      3 ml  Output      0 ml  Net      3 ml   Filed Weights   04/23/13 0553 04/24/13 0534  Weight: 89.047 kg (196 lb 5 oz) 89.495 kg (197 lb 4.8 oz)    Exam:   General:  AAOx3, no distress  HEENt: PERRLA, EOMI  Cardiovascular: S1S2/RRR  Respiratory: faint basilar crackles  Abdomen: soft, Nt, BS present  Musculoskeletal: 1 plus edema, no c/c   Data Reviewed: Basic Metabolic Panel:  Recent Labs Lab 04/23/13 0158 04/23/13 0744 04/24/13 0500  NA 142 141 142  K 4.2 4.1 4.4  CL 99 99 99  CO2 28 33* 32  GLUCOSE 168* 111* 112*  BUN 21 20 27*  CREATININE 1.17* 1.10 1.25*  CALCIUM 9.5 9.3 9.2   Liver Function Tests:  Recent Labs Lab 04/23/13 0744  AST 23  ALT 22  ALKPHOS 106  BILITOT 0.4  PROT 6.5  ALBUMIN 3.1*   No results found for this basename: LIPASE, AMYLASE,  in the last 168 hours No results found for this basename: AMMONIA,  in the last 168 hours CBC:  Recent Labs Lab 04/23/13 0158 04/23/13 0744 04/24/13 0500  WBC 7.8 7.7 5.2  NEUTROABS  --  5.3  --   HGB 14.3 12.5 12.7  HCT 42.1 36.8 38.6  MCV 95.7 95.6 97.5  PLT 197 194 199   Cardiac Enzymes:  Recent Labs Lab 04/23/13 0744  TROPONINI <0.30  BNP (last 3 results)  Recent Labs  03/20/13 0324 04/23/13 0155  PROBNP 757.6* 1031.0*   CBG: No results found for this basename: GLUCAP,  in the last 168 hours  No results found for this or any previous visit (from the past 240 hour(s)).   Studies: Dg Chest Port 1 View  04/23/2013   CLINICAL DATA:  Shortness of breath.  EXAM: PORTABLE CHEST - 1 VIEW  COMPARISON:  03/20/2013  FINDINGS: Cardiomegaly. Bilateral airspace opacities are noted, right greater than left. This likely reflects asymmetric edema. Cannot exclude infection. No visible effusions. No acute bony abnormality.  IMPRESSION: Cardiomegaly with vascular congestion and asymmetric  bilateral airspace disease, right greater than left. I favor this represents asymmetric edema although infection is not excluded.   Electronically Signed   By: Rolm Baptise M.D.   On: 04/23/2013 02:25    Scheduled Meds: . antiseptic oral rinse  15 mL Mouth Rinse BID  . aspirin EC  81 mg Oral Daily  . darifenacin  7.5 mg Oral Daily  . enoxaparin (LOVENOX) injection  40 mg Subcutaneous Q24H  . [START ON 04/25/2013] furosemide  40 mg Intravenous Daily  . metoprolol tartrate  25 mg Oral BID  . potassium chloride SA  20 mEq Oral Daily  . simvastatin  10 mg Oral q1800  . sodium chloride  3 mL Intravenous Q12H  . sodium chloride  3 mL Intravenous Q12H   Continuous Infusions:   Principal Problem:   CHF exacerbation Active Problems:   Diastolic heart failure   HTN (hypertension)   A-fib   Mitral regurgitation   Renal failure   CHF (congestive heart failure)    Time spent: 20min    [place] [facility] Pager [phone] If 7PM-7AM, please contact night-coverage at [email or web address], password [email or web address] 04/24/2013, 10:42 AM  LOS: 1 day

## 2013-04-24 NOTE — Progress Notes (Signed)
04/24/2013 SATURATION QUALIFICATIONS: (This note is used to comply with regulatory documentation for home oxygen)  Patient Saturations on Room Air at Rest = 81%  Patient Saturations on Room Air while Ambulating = 85%  Patient Saturations on 2 Liters of oxygen while Ambulating = 85%  Please briefly explain why patient needs home oxygen:  Pt desats both with and without O2 while ambulating, however, with O2 and standing rest break she is able to preform pursed lip breathing and increase O2 to above 90%.  Without O2 and standing rest break and pursed lip breathing she is unable to regain O2 into the 90s.

## 2013-04-24 NOTE — Evaluation (Signed)
Physical Therapy Evaluation Patient Details Name: [name] [name] MRN: [id number] DOB: 08/19/1940 Today's Date: 04/24/2013 Time: 5643-3295 PT Time Calculation (min): 28 min  PT Assessment / Plan / Recommendation History of Present Illness  73 y.o. female admitted to [facility] on 04/23/13 with  history of diastolic CHF last EF measured in April 06, 2013 was 60-65%, atrial fibrillation, mitral regurgitation presented to the ER because of sudden onset of shortness of breath last night while sleeping.  Dx with acute on chronic CHF, leg swelling, A-fib, mitral regurgitation, ARF on CKD.    Clinical Impression  Pt is walking close to her baseline gait pattern (per pt report), but O2 levels are dropping even on RA at rest.  She has increased DOE (2/4) and sats on 2 L O2 [place] dropped into the mid 80s.  She is perseverating throughout the session on the two "pin holes" she has in her valve.    PT Assessment  Patient needs continued PT services    Follow Up Recommendations  No PT follow up;Supervision - Intermittent    Does the patient have the potential to tolerate intense rehabilitation     NA  Barriers to Discharge   None      Equipment Recommendations  Other (comment) (possible need for home O2)    Recommendations for Other Services    NA  Frequency Min 3X/week    Precautions / Restrictions Precautions Precautions: Other (comment) Precaution Comments: monitor O2 sats    Pertinent Vitals/Pain O2 sats on 2 L O2 [place] at rest 96%, O2 sats on RA seated EOB 81%, O2 sats seated EOB on 2 L O2 [place] 94%, O2 sats when walking on 2 L O2 [place] 85% with standing rest break and pursed lip breathing increased to 90%.       Mobility  Bed Mobility Overal bed mobility: Modified Independent General bed mobility comments: pt using railing for leverage.  Transfers Overall transfer level: Needs assistance Equipment used: None Transfers: Sit to/from Stand Sit to Stand: Min guard General transfer comment: min  guard assist to steady pt for balance.  Ambulation/Gait Ambulation/Gait assistance: Min guard Ambulation Distance (Feet): 150 Feet Assistive device: None Gait Pattern/deviations: Staggering left;Staggering right;Antalgic Gait velocity: decreased Gait velocity interpretation: Below normal speed for age/gender General Gait Details: pt with antalgic gait pattern due to "bone on bone" knee.  She has had one knee replced, but the other knee needs it per pt report.  Antalgic staggering gait pattern is her baseline.  Pt reports she has never fallen because of her knees, but she hasn't walked normal in years.         PT Diagnosis: Difficulty walking;Abnormality of gait;Generalized weakness;Acute pain  PT Problem List: Decreased strength;Decreased activity tolerance;Decreased mobility;Decreased balance;Decreased knowledge of use of DME;Cardiopulmonary status limiting activity;Obesity;Pain PT Treatment Interventions: DME instruction;Gait training;Stair training;Functional mobility training;Therapeutic activities;Therapeutic exercise;Balance training;Neuromuscular re-education;Patient/family education;Modalities     PT Goals(Current goals can be found in the care plan section) Acute Rehab PT Goals Patient Stated Goal: to go home and get back to work.  PT Goal Formulation: With patient Time For Goal Achievement: 05/08/13 Potential to Achieve Goals: Good  Visit Information  Last PT Received On: 04/24/13 Assistance Needed: +1 History of Present Illness: 74 y.o. female admitted to [facility] on 04/23/13 with  history of diastolic CHF last EF measured in April 06, 2013 was 60-65%, atrial fibrillation, mitral regurgitation presented to the ER because of sudden onset of shortness of breath last night while sleeping.  Dx  with acute on chronic CHF, leg swelling, A-fib, mitral regurgitation, ARF on CKD.         Prior [place] expects to be discharged to:: Private  residence Living Arrangements: Other relatives (grandson, his wife and two children live in her home) Available Help at Discharge: Family;Available 24 hours/day Type of Home: House Home Access: Stairs to enter [employer] of Steps: 2 Entrance Stairs-Rails: Right Home Layout: One level Home Equipment: None Additional Comments: Pt did not use O2 at home PTA.  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  Communication Communication: No difficulties (horse voice quality. 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   Cervical / Trunk Assessment Cervical / Trunk Assessment: Normal   Balance Balance Overall balance assessment: Needs assistance Sitting-balance support: Feet supported;No upper extremity supported Sitting balance-[name] Scale: Good Standing balance support: No upper extremity supported Standing balance-[name] Scale: Good General Comments General comments (skin integrity, edema, etc.): Sitting EOB on RA O2 sats 81%, at rest on 2 L O2 [place] O2 sats 96%, with gait on 2 L O2 [place] O2 sats 85%.  Pt unable to recover O2 sats on RA with pursed lip breathing, but was able to recover in standing with pursed lip breathing with O2 applied.  DOE 2/4 with all mobility.    End of Session PT - End of Session Equipment Utilized During Treatment: Gait belt;Oxygen Activity Tolerance:  Patient limited by fatigue;Treatment limited secondary to medical complications (Comment) (limited by DOE and knee pain) Patient left: in chair;with call bell/phone within reach Nurse Communication: Mobility status;Other (comment) (O2 sats)    Wells Guiles B. Rondall Radigan, PT, DPT [phone]   04/24/2013, 3:12 PM

## 2013-04-25 ENCOUNTER — Telehealth: Payer: Self-pay | Admitting: Cardiology

## 2013-04-25 LAB — BASIC METABOLIC PANEL
BUN: 30 mg/dL — AB (ref 6–23)
CHLORIDE: 101 meq/L (ref 96–112)
CO2: 34 meq/L — AB (ref 19–32)
Calcium: 9 mg/dL (ref 8.4–10.5)
Creatinine, Ser: 1.39 mg/dL — ABNORMAL HIGH (ref 0.50–1.10)
GFR calc Af Amer: 43 mL/min — ABNORMAL LOW (ref >90)
GFR calc non Af Amer: 37 mL/min — ABNORMAL LOW (ref >90)
GLUCOSE: 107 mg/dL — AB (ref 70–99)
POTASSIUM: 4.5 meq/L (ref 3.7–5.3)
Sodium: 139 mEq/L (ref 137–147)

## 2013-04-25 MED ORDER — FUROSEMIDE 40 MG PO TABS
40.0000 mg | ORAL_TABLET | Freq: Every day | ORAL | Status: DC
  Administered 2013-04-26: 40 mg via ORAL
  Filled 2013-04-25: qty 1

## 2013-04-25 NOTE — Progress Notes (Signed)
[facility] PROGRESS NOTE  [name] [name] QZR:[id number] DOB: [birth year] DOA: 04/23/2013 PCP: [name], MD  Assessment/Plan:  1. Acute on chronic diastolic heart failure, EF 60-65%  -continue IV lasix cut down dose to 40 daily due to bump in creatinine  -hold ARB today  -I/Os inaccurate   2. leg swelling  -likely due to 1  - Dopplers negative for DVT.   3. Atrial fibrillation  -rate controlled, continue metoprolol, ASA  -she stopped Xarelto due to recurrent nose bleeds   4. History of mitral regurgitation - probably contributing to #1. -Patient to followup with cardiologist on discharge  5. Mild ARF on CKD  -due to diuresis  -cut down diuretics and place on oral regimen, hold ARB  -bmet in am   DVT proph: lovenox  Code Status: Full Code  Family Communication: none at bedside  Disposition Plan: Home when improved   Consultants:  None  Procedures:  None  Antibiotics:  None  HPI/Subjective: Pt has no new complaints. No acute issues reported overnight.  Objective: Filed Vitals:   04/25/13 1344  BP: 124/78  Pulse: 78  Temp: 97.8 F (36.6 C)  Resp:     Intake/Output Summary (Last 24 hours) at 04/25/13 1752 Last data filed at 04/25/13 1300  Gross per 24 hour  Intake    600 ml  Output      0 ml  Net    600 ml   Filed Weights   04/23/13 0553 04/24/13 0534 04/25/13 0516  Weight: 89.047 kg (196 lb 5 oz) 89.495 kg (197 lb 4.8 oz) 89.223 kg (196 lb 11.2 oz)    Exam:   General:  Pt in NAD, alert and awake  Cardiovascular: irregularly irregular, no rubs  Respiratory: No wheezes, decreased breath sounds at bases  Abdomen: soft, NT, ND  Musculoskeletal: no cyanosis or clubbing   Data Reviewed: Basic Metabolic Panel:  Recent Labs Lab 04/23/13 0158 04/23/13 0744 04/24/13 0500 04/25/13 0600  NA 142 141 142 139  K 4.2 4.1 4.4 4.5  CL 99 99 99 101  CO2 28 33* 32 34*  GLUCOSE 168* 111* 112* 107*  BUN 21 20 27* 30*  CREATININE  1.17* 1.10 1.25* 1.39*  CALCIUM 9.5 9.3 9.2 9.0   Liver Function Tests:  Recent Labs Lab 04/23/13 0744  AST 23  ALT 22  ALKPHOS 106  BILITOT 0.4  PROT 6.5  ALBUMIN 3.1*   No results found for this basename: LIPASE, AMYLASE,  in the last 168 hours No results found for this basename: AMMONIA,  in the last 168 hours CBC:  Recent Labs Lab 04/23/13 0158 04/23/13 0744 04/24/13 0500  WBC 7.8 7.7 5.2  NEUTROABS  --  5.3  --   HGB 14.3 12.5 12.7  HCT 42.1 36.8 38.6  MCV 95.7 95.6 97.5  PLT 197 194 199   Cardiac Enzymes:  Recent Labs Lab 04/23/13 0744 04/24/13 1120  TROPONINI <0.30 <0.30   BNP (last 3 results)  Recent Labs  03/20/13 0324 04/23/13 0155  PROBNP 757.6* 1031.0*   CBG: No results found for this basename: GLUCAP,  in the last 168 hours  No results found for this or any previous visit (from the past 240 hour(s)).   Studies: No results found.  Scheduled Meds: . antiseptic oral rinse  15 mL Mouth Rinse BID  . aspirin EC  81 mg Oral Daily  . darifenacin  7.5 mg Oral Daily  . enoxaparin (LOVENOX) injection  40 mg Subcutaneous Q24H  .  furosemide  40 mg Intravenous Daily  . metoprolol tartrate  25 mg Oral BID  . potassium chloride SA  20 mEq Oral Daily  . simvastatin  10 mg Oral q1800  . sodium chloride  3 mL Intravenous Q12H  . sodium chloride  3 mL Intravenous Q12H   Continuous Infusions:   Principal Problem:   CHF exacerbation Active Problems:   Diastolic heart failure   HTN (hypertension)   A-fib   Mitral regurgitation   Renal failure   CHF (congestive heart failure)    Time spent: > 35 minutes    Velvet Bathe  [facility] Pager [phone]. If 7PM-7AM, please contact night-coverage at [email or web address], password [facility] 04/25/2013, 5:52 PM  LOS: 2 days

## 2013-04-25 NOTE — Progress Notes (Signed)
Patient evaluated for community based chronic disease management services with [place] [facility] as a benefit of patient's [employer]. Spoke with patient at bedside to explain [place] [facility] services.  Patient will receive a post discharge transition of care call and will be evaluated for monthly home visits for assessments and CHF disease process education.  Written consents and alternate contact information obtained.  Left contact information and [facility] literature at bedside. Made Inpatient Case Manager aware that [place] Management following. Of note, [facility] Care Management services does not replace or interfere with any services that are arranged by inpatient case management or social work.  For additional questions or referrals please contact Corliss Blacker BSN RN [facility] Liaison at [phone].

## 2013-04-25 NOTE — Telephone Encounter (Signed)
Informed pt daughter to follow instructions given to them by hospital staff and to keep follow up appointment for pt that is scheduled for 05-03-13 with Dr. Harl Bowie. Pt daughter verbalized understanding.

## 2013-04-25 NOTE — Progress Notes (Signed)
Physical Therapy Treatment Patient Details Name: [name] [name] MRN: [id number] DOB: 20-Nov-1940 Today's Date: 04/25/2013 Time: 1036-1100 PT Time Calculation (min): 24 min  PT Assessment / Plan / Recommendation  History of Present Illness 73 y.o. female admitted to [name] on 04/23/13 with  history of diastolic CHF last EF measured in April 06, 2013 was 60-65%, atrial fibrillation, mitral regurgitation presented to the ER because of sudden onset of shortness of breath last night while sleeping.  Dx with acute on chronic CHF, leg swelling, A-fib, mitral regurgitation, ARF on CKD.     PT Comments   Pt with improved O2 sats on 2LO2 today, able to gait with spO2 > 93% throughout.  Able to gait with 1 standing rest break 150'.  Educated pt on using RW for safety at home, pt agreeable to try RW.   Follow Up Recommendations  No PT follow up;Supervision - Intermittent     Does the patient have the potential to tolerate intense rehabilitation     Barriers to Discharge        Equipment Recommendations  Rolling walker with 5" wheels    Recommendations for Other Services    Frequency Min 3X/week   Progress towards PT Goals Progress towards PT goals: Progressing toward goals  Plan Current plan remains appropriate    Precautions / Restrictions Precautions Precaution Comments: monitor O2 sats  Restrictions Weight Bearing Restrictions: No   Pertinent Vitals/Pain No c/o pain, spO2 >93% during gait on 2LO2    Mobility  Bed Mobility Overal bed mobility: Modified Independent General bed mobility comments: uses railing, needs encouragement to perform sit to supine without assistance, needs increased time and momentum to lift LEs into bed Transfers Equipment used: None Sit to Stand: Min guard General transfer comment: steadying assist for balance Ambulation/Gait Ambulation/Gait assistance: Min guard Ambulation Distance (Feet): 150 Feet Assistive device: None Gait velocity: decreased Gait  velocity interpretation: Below normal speed for age/gender General Gait Details: antalgic gait due to knee weakness. pt states this is her baseline gait but that she is willing to try using a RW for increased safety and balance at home.  PT encouraged pt to use RW to prevent falls    Exercises     PT Diagnosis:    PT Problem List:   PT Treatment Interventions:     PT Goals (current goals can now be found in the care plan section)    Visit Information  Last PT Received On: 04/25/13 Assistance Needed: +1 History of Present Illness: 73 y.o. female admitted to [facility] on 04/23/13 with  history of diastolic CHF last EF measured in April 06, 2013 was 60-65%, atrial fibrillation, mitral regurgitation presented to the ER because of sudden onset of shortness of breath last night while sleeping.  Dx with acute on chronic CHF, leg swelling, A-fib, mitral regurgitation, ARF on CKD.      Subjective Data      Cognition  Cognition Arousal/Alertness: Awake/alert Behavior During Therapy: WFL for tasks assessed/performed Overall Cognitive Status: Within Functional Limits for tasks assessed    Balance   Pt performed standing balance to help straighten bed with 1 UE support with supervision for reaching and bending tasks.  End of Session PT - End of Session Equipment Utilized During Treatment: Oxygen Activity Tolerance: Patient tolerated treatment well Patient left: in bed;with call bell/phone within reach Nurse Communication: Mobility status   GP     Kimmy Totten 04/25/2013, 11:07 AM

## 2013-04-25 NOTE — Progress Notes (Signed)
Pt c/o of epistaxis that lasted about 2 mins. Pt took her O2 off believed to be drying her nostrils. Will continue to monitor the pt. Hoover Brunette, RN

## 2013-04-26 LAB — BASIC METABOLIC PANEL
BUN: 23 mg/dL (ref 6–23)
CHLORIDE: 98 meq/L (ref 96–112)
CO2: 28 meq/L (ref 19–32)
Calcium: 9.2 mg/dL (ref 8.4–10.5)
Creatinine, Ser: 0.92 mg/dL (ref 0.50–1.10)
GFR calc Af Amer: 70 mL/min — ABNORMAL LOW (ref >90)
GFR calc non Af Amer: 61 mL/min — ABNORMAL LOW (ref >90)
Glucose, Bld: 125 mg/dL — ABNORMAL HIGH (ref 70–99)
Potassium: 4.1 mEq/L (ref 3.7–5.3)
Sodium: 137 mEq/L (ref 137–147)

## 2013-04-26 LAB — URINALYSIS, ROUTINE W REFLEX MICROSCOPIC
Bilirubin Urine: NEGATIVE
Glucose, UA: NEGATIVE mg/dL
Hgb urine dipstick: NEGATIVE
Ketones, ur: NEGATIVE mg/dL
LEUKOCYTES UA: NEGATIVE
Nitrite: NEGATIVE
PROTEIN: NEGATIVE mg/dL
Specific Gravity, Urine: 1.019 (ref 1.005–1.030)
Urobilinogen, UA: 0.2 mg/dL (ref 0.0–1.0)
pH: 6 (ref 5.0–8.0)

## 2013-04-26 NOTE — Progress Notes (Signed)
Utilization review completed.  

## 2013-04-26 NOTE — Discharge Instructions (Signed)
Home Health  Services arranged with [place]. [phone].  Registered Nurse for CHF management Durable Medical equipment to be delivered by [facility]. Rolling walker with seat [phone]

## 2013-04-26 NOTE — Progress Notes (Signed)
Pt 02 sat 92% RA at rest, dropped to 89% RA with ambulation, maintained between 89-91% on RA with ambulation, pt denies SOB/dizziness. Will continue to monitor.

## 2013-04-26 NOTE — Discharge Summary (Signed)
Physician Discharge Summary  [name] [name] [id number]:[id number] DOB: [birth year] DOA: 04/23/2013  PCP: [name], MD  Admit date: 04/23/2013 Discharge date: 04/26/2013  Time spent: >35  minutes  Recommendations for Outpatient Follow-up:  1. Please be sure to follow up with your primary care physician.  Discharge Diagnoses:  Principal Problem:   CHF exacerbation Active Problems:   Diastolic heart failure   HTN (hypertension)   A-fib   Mitral regurgitation   Renal failure   CHF (congestive heart failure)   Discharge Condition: Stable  Diet recommendation: Low sodium heart healthy  Filed Weights   04/23/13 0553 04/24/13 0534 04/25/13 0516  Weight: 89.047 kg (196 lb 5 oz) 89.495 kg (197 lb 4.8 oz) 89.223 kg (196 lb 11.2 oz)    History of present illness:  73 year old with history of diastolic CHF last EF measured 04/06/2013 was 60-65%, atrial fibrillation, mitral regurgitation who presented to the ED complaining of shortness of breath  Hospital Course:  1. Acute on chronic diastolic heart failure, EF 60-65%  -Much improved and breathing comfortably on room air. Will discharge on Lasix 40 mg by mouth daily -hold ARB given recent acute renal failure, may be continued in the future if serum creatinine permits, we'll defer to primary care physician  2. leg swelling  -likely due to 1  - Dopplers negative for DVT.  - Resolving on Lasix  3. Atrial fibrillation  -rate controlled, continue metoprolol, ASA  -she stopped Xarelto due to recurrent nose bleeds - Will have secretary set up followup appointment with cardiology in one to 2 weeks post discharge   4. History of mitral regurgitation - probably contributing to #1.  -Patient to followup with cardiologist on discharge    5. Mild ARF on CKD  -due to diuresis  -Resolved. Will hold ARB on discharge   Procedures:  None  Consultations:  None  Discharge Exam: Filed Vitals:   04/26/13 1133  BP: 127/69  Pulse:  90  Temp:   Resp:     General: Pt in NAD, Alert and awake Cardiovascular: irregularly irregular, no rubs Respiratory: CTA BL, no wheezes, no increased work of breathing on room air  Discharge Instructions  Discharge Orders   Future Appointments Provider Department Dept Phone   05/03/2013 10:40 AM [name], MD [place] [phone]   Future Orders Complete By Expires   Call MD for:  redness, tenderness, or signs of infection (pain, swelling, redness, odor or green/yellow discharge around incision site)  As directed    Call MD for:  temperature >100.4  As directed    Diet - low sodium heart healthy  As directed    Increase activity slowly  As directed        Medication List    STOP taking these medications       losartan 50 MG tablet  Commonly known as:  COZAAR      TAKE these medications       acetaminophen 500 MG tablet  Commonly known as:  TYLENOL  Take 500 mg by mouth every 8 (eight) hours as needed for moderate pain.     aspirin EC 81 MG tablet  Take 81 mg by mouth daily.     CALTRATE 600+D PO  Take 1 tablet by mouth daily. Soft chew     furosemide 40 MG tablet  Commonly known as:  LASIX  Take 1.5 tablets (60 mg total) by mouth 2 (two) times daily.  metoprolol tartrate 25 MG tablet  Commonly known as:  LOPRESSOR  Take 1 tablet (25 mg total) by mouth 2 (two) times daily.     potassium chloride SA 20 MEQ tablet  Commonly known as:  K-DUR,KLOR-CON  Take 1 tablet (20 mEq total) by mouth 2 (two) times daily.     pravastatin 20 MG tablet  Commonly known as:  PRAVACHOL  Take 20 mg by mouth at bedtime.     solifenacin 5 MG tablet  Commonly known as:  VESICARE  Take 5 mg by mouth daily.       Allergies  Allergen Reactions  . Indomethacin Other (See Comments)    dizziness  . Norvasc [Amlodipine Besylate] Cough      The results of significant diagnostics from this hospitalization (including imaging,  microbiology, ancillary and laboratory) are listed below for reference.    Significant Diagnostic Studies: Dg Chest Port 1 View  04/23/2013   CLINICAL DATA:  Shortness of breath.  EXAM: PORTABLE CHEST - 1 VIEW  COMPARISON:  03/20/2013  FINDINGS: Cardiomegaly. Bilateral airspace opacities are noted, right greater than left. This likely reflects asymmetric edema. Cannot exclude infection. No visible effusions. No acute bony abnormality.  IMPRESSION: Cardiomegaly with vascular congestion and asymmetric bilateral airspace disease, right greater than left. I favor this represents asymmetric edema although infection is not excluded.   Electronically Signed   By: Rolm Baptise M.D.   On: 04/23/2013 02:25    Microbiology: No results found for this or any previous visit (from the past 240 hour(s)).   Labs: Basic Metabolic Panel:  Recent Labs Lab 04/23/13 0158 04/23/13 0744 04/24/13 0500 04/25/13 0600 04/26/13 0940  NA 142 141 142 139 137  K 4.2 4.1 4.4 4.5 4.1  CL 99 99 99 101 98  CO2 28 33* 32 34* 28  GLUCOSE 168* 111* 112* 107* 125*  BUN 21 20 27* 30* 23  CREATININE 1.17* 1.10 1.25* 1.39* 0.92  CALCIUM 9.5 9.3 9.2 9.0 9.2   Liver Function Tests:  Recent Labs Lab 04/23/13 0744  AST 23  ALT 22  ALKPHOS 106  BILITOT 0.4  PROT 6.5  ALBUMIN 3.1*   No results found for this basename: LIPASE, AMYLASE,  in the last 168 hours No results found for this basename: AMMONIA,  in the last 168 hours CBC:  Recent Labs Lab 04/23/13 0158 04/23/13 0744 04/24/13 0500  WBC 7.8 7.7 5.2  NEUTROABS  --  5.3  --   HGB 14.3 12.5 12.7  HCT 42.1 36.8 38.6  MCV 95.7 95.6 97.5  PLT 197 194 199   Cardiac Enzymes:  Recent Labs Lab 04/23/13 0744 04/24/13 1120  TROPONINI <0.30 <0.30   BNP: BNP (last 3 results)  Recent Labs  03/20/13 0324 04/23/13 0155  PROBNP 757.6* 1031.0*   CBG: No results found for this basename: GLUCAP,  in the last 168 hours     Signed:  Velvet Bathe  [facility] 04/26/2013, 1:41 PM

## 2013-04-28 ENCOUNTER — Emergency Department (HOSPITAL_COMMUNITY): Payer: Medicare HMO

## 2013-04-28 ENCOUNTER — Encounter (HOSPITAL_COMMUNITY): Payer: Self-pay | Admitting: Emergency Medicine

## 2013-04-28 ENCOUNTER — Inpatient Hospital Stay (HOSPITAL_COMMUNITY)
Admission: EM | Admit: 2013-04-28 | Discharge: 2013-05-30 | Disposition: A | Discharge status: Skilled Nursing Facility | Payer: Medicare HMO | Source: Home / Self Care | Attending: Thoracic Surgery (Cardiothoracic Vascular Surgery) | Admitting: Thoracic Surgery (Cardiothoracic Vascular Surgery)

## 2013-04-28 DIAGNOSIS — I129 Hypertensive chronic kidney disease with stage 1 through stage 4 chronic kidney disease, or unspecified chronic kidney disease: Secondary | ICD-10-CM

## 2013-04-28 DIAGNOSIS — I700 Atherosclerosis of aorta: Secondary | ICD-10-CM

## 2013-04-28 DIAGNOSIS — R197 Diarrhea, unspecified: Secondary | ICD-10-CM | POA: Diagnosis not present

## 2013-04-28 DIAGNOSIS — I5033 Acute on chronic diastolic (congestive) heart failure: Secondary | ICD-10-CM | POA: Diagnosis present

## 2013-04-28 DIAGNOSIS — K560 Paralytic ileus: Secondary | ICD-10-CM | POA: Diagnosis not present

## 2013-04-28 DIAGNOSIS — R4701 Aphasia: Secondary | ICD-10-CM | POA: Diagnosis not present

## 2013-04-28 DIAGNOSIS — I509 Heart failure, unspecified: Secondary | ICD-10-CM

## 2013-04-28 DIAGNOSIS — E875 Hyperkalemia: Secondary | ICD-10-CM | POA: Diagnosis not present

## 2013-04-28 DIAGNOSIS — R5381 Other malaise: Secondary | ICD-10-CM | POA: Diagnosis not present

## 2013-04-28 DIAGNOSIS — Y832 Surgical operation with anastomosis, bypass or graft as the cause of abnormal reaction of the patient, or of later complication, without mention of misadventure at the time of the procedure: Secondary | ICD-10-CM

## 2013-04-28 DIAGNOSIS — I080 Rheumatic disorders of both mitral and aortic valves: Secondary | ICD-10-CM | POA: Diagnosis present

## 2013-04-28 DIAGNOSIS — Z9889 Other specified postprocedural states: Secondary | ICD-10-CM

## 2013-04-28 DIAGNOSIS — L270 Generalized skin eruption due to drugs and medicaments taken internally: Secondary | ICD-10-CM | POA: Diagnosis not present

## 2013-04-28 DIAGNOSIS — N179 Acute kidney failure, unspecified: Secondary | ICD-10-CM | POA: Diagnosis not present

## 2013-04-28 DIAGNOSIS — M1990 Unspecified osteoarthritis, unspecified site: Secondary | ICD-10-CM | POA: Diagnosis present

## 2013-04-28 DIAGNOSIS — D62 Acute posthemorrhagic anemia: Secondary | ICD-10-CM | POA: Diagnosis not present

## 2013-04-28 DIAGNOSIS — Z8679 Personal history of other diseases of the circulatory system: Secondary | ICD-10-CM

## 2013-04-28 DIAGNOSIS — R32 Unspecified urinary incontinence: Secondary | ICD-10-CM | POA: Diagnosis present

## 2013-04-28 DIAGNOSIS — F411 Generalized anxiety disorder: Secondary | ICD-10-CM

## 2013-04-28 DIAGNOSIS — Z8249 Family history of ischemic heart disease and other diseases of the circulatory system: Secondary | ICD-10-CM

## 2013-04-28 DIAGNOSIS — I4891 Unspecified atrial fibrillation: Secondary | ICD-10-CM | POA: Diagnosis present

## 2013-04-28 DIAGNOSIS — R040 Epistaxis: Secondary | ICD-10-CM

## 2013-04-28 DIAGNOSIS — E785 Hyperlipidemia, unspecified: Secondary | ICD-10-CM | POA: Diagnosis present

## 2013-04-28 DIAGNOSIS — E876 Hypokalemia: Secondary | ICD-10-CM | POA: Diagnosis not present

## 2013-04-28 DIAGNOSIS — I00 Rheumatic fever without heart involvement: Secondary | ICD-10-CM

## 2013-04-28 DIAGNOSIS — D696 Thrombocytopenia, unspecified: Secondary | ICD-10-CM

## 2013-04-28 DIAGNOSIS — E669 Obesity, unspecified: Secondary | ICD-10-CM | POA: Diagnosis present

## 2013-04-28 DIAGNOSIS — Z96659 Presence of unspecified artificial knee joint: Secondary | ICD-10-CM

## 2013-04-28 DIAGNOSIS — E872 Acidosis, unspecified: Secondary | ICD-10-CM | POA: Diagnosis not present

## 2013-04-28 DIAGNOSIS — J810 Acute pulmonary edema: Secondary | ICD-10-CM

## 2013-04-28 DIAGNOSIS — M1710 Unilateral primary osteoarthritis, unspecified knee: Secondary | ICD-10-CM

## 2013-04-28 DIAGNOSIS — I2789 Other specified pulmonary heart diseases: Secondary | ICD-10-CM

## 2013-04-28 DIAGNOSIS — Z8673 Personal history of transient ischemic attack (TIA), and cerebral infarction without residual deficits: Secondary | ICD-10-CM

## 2013-04-28 DIAGNOSIS — J9600 Acute respiratory failure, unspecified whether with hypoxia or hypercapnia: Secondary | ICD-10-CM | POA: Diagnosis present

## 2013-04-28 DIAGNOSIS — I472 Ventricular tachycardia, unspecified: Secondary | ICD-10-CM | POA: Diagnosis not present

## 2013-04-28 DIAGNOSIS — I10 Essential (primary) hypertension: Secondary | ICD-10-CM

## 2013-04-28 DIAGNOSIS — K929 Disease of digestive system, unspecified: Secondary | ICD-10-CM | POA: Diagnosis not present

## 2013-04-28 DIAGNOSIS — Z79899 Other long term (current) drug therapy: Secondary | ICD-10-CM

## 2013-04-28 DIAGNOSIS — N19 Unspecified kidney failure: Secondary | ICD-10-CM | POA: Diagnosis present

## 2013-04-28 DIAGNOSIS — I4821 Permanent atrial fibrillation: Secondary | ICD-10-CM | POA: Diagnosis present

## 2013-04-28 DIAGNOSIS — I5032 Chronic diastolic (congestive) heart failure: Secondary | ICD-10-CM | POA: Diagnosis present

## 2013-04-28 DIAGNOSIS — I4729 Other ventricular tachycardia: Secondary | ICD-10-CM | POA: Diagnosis not present

## 2013-04-28 DIAGNOSIS — T502X5A Adverse effect of carbonic-anhydrase inhibitors, benzothiadiazides and other diuretics, initial encounter: Secondary | ICD-10-CM | POA: Diagnosis not present

## 2013-04-28 DIAGNOSIS — Q231 Congenital insufficiency of aortic valve: Secondary | ICD-10-CM

## 2013-04-28 DIAGNOSIS — Z8541 Personal history of malignant neoplasm of cervix uteri: Secondary | ICD-10-CM

## 2013-04-28 DIAGNOSIS — I059 Rheumatic mitral valve disease, unspecified: Secondary | ICD-10-CM

## 2013-04-28 DIAGNOSIS — Z953 Presence of xenogenic heart valve: Secondary | ICD-10-CM

## 2013-04-28 DIAGNOSIS — Z7982 Long term (current) use of aspirin: Secondary | ICD-10-CM

## 2013-04-28 DIAGNOSIS — N189 Chronic kidney disease, unspecified: Secondary | ICD-10-CM

## 2013-04-28 DIAGNOSIS — I498 Other specified cardiac arrhythmias: Secondary | ICD-10-CM | POA: Diagnosis not present

## 2013-04-28 DIAGNOSIS — R0602 Shortness of breath: Secondary | ICD-10-CM

## 2013-04-28 DIAGNOSIS — I340 Nonrheumatic mitral (valve) insufficiency: Secondary | ICD-10-CM

## 2013-04-28 DIAGNOSIS — I639 Cerebral infarction, unspecified: Secondary | ICD-10-CM

## 2013-04-28 HISTORY — DX: Congenital insufficiency of aortic valve: Q23.1

## 2013-04-28 HISTORY — DX: Presence of xenogenic heart valve: Z95.3

## 2013-04-28 HISTORY — DX: Paroxysmal atrial fibrillation: I48.0

## 2013-04-28 HISTORY — DX: Personal history of other diseases of the circulatory system: Z86.79

## 2013-04-28 HISTORY — DX: Unspecified osteoarthritis, unspecified site: M19.90

## 2013-04-28 HISTORY — DX: Nonrheumatic mitral (valve) insufficiency: I34.0

## 2013-04-28 HISTORY — DX: Bicuspid aortic valve: Q23.81

## 2013-04-28 HISTORY — DX: Chronic diastolic (congestive) heart failure: I50.32

## 2013-04-28 HISTORY — DX: Other specified postprocedural states: Z98.890

## 2013-04-28 LAB — COMPREHENSIVE METABOLIC PANEL
ALT: 35 U/L (ref 0–35)
AST: 35 U/L (ref 0–37)
Albumin: 3.2 g/dL — ABNORMAL LOW (ref 3.5–5.2)
Alkaline Phosphatase: 109 U/L (ref 39–117)
BUN: 22 mg/dL (ref 6–23)
CALCIUM: 9.8 mg/dL (ref 8.4–10.5)
CO2: 32 meq/L (ref 19–32)
CREATININE: 0.97 mg/dL (ref 0.50–1.10)
Chloride: 100 mEq/L (ref 96–112)
GFR calc non Af Amer: 57 mL/min — ABNORMAL LOW (ref >90)
GFR, EST AFRICAN AMERICAN: 66 mL/min — AB (ref >90)
Glucose, Bld: 110 mg/dL — ABNORMAL HIGH (ref 70–99)
Potassium: 4.1 mEq/L (ref 3.7–5.3)
Sodium: 143 mEq/L (ref 137–147)
Total Bilirubin: 0.4 mg/dL (ref 0.3–1.2)
Total Protein: 7 g/dL (ref 6.0–8.3)

## 2013-04-28 LAB — CBC WITH DIFFERENTIAL/PLATELET
BASOS ABS: 0 10*3/uL (ref 0.0–0.1)
BASOS PCT: 1 % (ref 0–1)
EOS PCT: 5 % (ref 0–5)
Eosinophils Absolute: 0.2 10*3/uL (ref 0.0–0.7)
HEMATOCRIT: 38 % (ref 36.0–46.0)
Hemoglobin: 12.8 g/dL (ref 12.0–15.0)
LYMPHS PCT: 30 % (ref 12–46)
Lymphs Abs: 1.4 10*3/uL (ref 0.7–4.0)
MCH: 32.2 pg (ref 26.0–34.0)
MCHC: 33.7 g/dL (ref 30.0–36.0)
MCV: 95.7 fL (ref 78.0–100.0)
MONOS PCT: 11 % (ref 3–12)
Monocytes Absolute: 0.5 10*3/uL (ref 0.1–1.0)
Neutro Abs: 2.6 10*3/uL (ref 1.7–7.7)
Neutrophils Relative %: 54 % (ref 43–77)
Platelets: 212 10*3/uL (ref 150–400)
RBC: 3.97 MIL/uL (ref 3.87–5.11)
RDW: 12.9 % (ref 11.5–15.5)
WBC: 4.7 10*3/uL (ref 4.0–10.5)

## 2013-04-28 LAB — TROPONIN I
Troponin I: <0.3 ng/mL (ref <0.30)
Troponin I: <0.3 ng/mL (ref <0.30)
Troponin I: <0.3 ng/mL (ref <0.30)

## 2013-04-28 LAB — PRO B NATRIURETIC PEPTIDE: Pro B Natriuretic peptide (BNP): 1163 pg/mL — ABNORMAL HIGH (ref 0–125)

## 2013-04-28 MED ORDER — POTASSIUM CHLORIDE CRYS ER 20 MEQ PO TBCR
20.0000 meq | EXTENDED_RELEASE_TABLET | Freq: Two times a day (BID) | ORAL | Status: DC
  Administered 2013-04-28 – 2013-05-03 (×11): 20 meq via ORAL
  Filled 2013-04-28 (×14): qty 1

## 2013-04-28 MED ORDER — MORPHINE SULFATE 2 MG/ML IJ SOLN
1.0000 mg | INTRAMUSCULAR | Status: DC | PRN
  Administered 2013-04-28 – 2013-05-03 (×6): 1 mg via INTRAVENOUS
  Filled 2013-04-28 (×5): qty 1

## 2013-04-28 MED ORDER — DEXTROSE 5 % IV SOLN: 1.0000 g | Freq: Once | INTRAVENOUS | Status: DC

## 2013-04-28 MED ORDER — SODIUM CHLORIDE 0.9 % IJ SOLN
3.0000 mL | INTRAMUSCULAR | Status: DC | PRN
  Administered 2013-05-12: 3 mL via INTRAVENOUS

## 2013-04-28 MED ORDER — DEXTROSE 5 % IV SOLN: 500.0000 mg | Freq: Once | INTRAVENOUS | Status: DC

## 2013-04-28 MED ORDER — HYDROCODONE-ACETAMINOPHEN 5-325 MG PO TABS
1.0000 | ORAL_TABLET | Freq: Four times a day (QID) | ORAL | Status: DC | PRN
  Administered 2013-04-29 – 2013-05-12 (×5): 2 via ORAL
  Filled 2013-04-28 (×5): qty 2

## 2013-04-28 MED ORDER — ACETAMINOPHEN 325 MG PO TABS
650.0000 mg | ORAL_TABLET | Freq: Four times a day (QID) | ORAL | Status: DC | PRN
  Administered 2013-04-28: 650 mg via ORAL
  Filled 2013-04-28: qty 2

## 2013-04-28 MED ORDER — SODIUM CHLORIDE 0.9 % IV SOLN
250.0000 mL | INTRAVENOUS | Status: DC | PRN
  Administered 2013-05-02 – 2013-05-11 (×2): 250 mL via INTRAVENOUS

## 2013-04-28 MED ORDER — TRAMADOL HCL 50 MG PO TABS
50.0000 mg | ORAL_TABLET | Freq: Four times a day (QID) | ORAL | Status: DC | PRN
  Administered 2013-04-28: 50 mg via ORAL
  Filled 2013-04-28: qty 1

## 2013-04-28 MED ORDER — ASPIRIN EC 81 MG PO TBEC
81.0000 mg | DELAYED_RELEASE_TABLET | Freq: Every day | ORAL | Status: DC
  Administered 2013-04-28 – 2013-04-30 (×3): 81 mg via ORAL
  Filled 2013-04-28 (×3): qty 1

## 2013-04-28 MED ORDER — ACETAMINOPHEN 325 MG PO TABS
650.0000 mg | ORAL_TABLET | ORAL | Status: DC | PRN
  Administered 2013-04-28: 650 mg via ORAL
  Filled 2013-04-28 (×2): qty 2

## 2013-04-28 MED ORDER — FUROSEMIDE 10 MG/ML IJ SOLN
80.0000 mg | Freq: Once | INTRAMUSCULAR | Status: AC
  Administered 2013-04-28: 80 mg via INTRAVENOUS
  Filled 2013-04-28: qty 8

## 2013-04-28 MED ORDER — SODIUM CHLORIDE 0.9 % IJ SOLN
3.0000 mL | Freq: Two times a day (BID) | INTRAMUSCULAR | Status: DC
  Administered 2013-04-28 – 2013-05-16 (×21): 3 mL via INTRAVENOUS

## 2013-04-28 MED ORDER — FUROSEMIDE 10 MG/ML IJ SOLN
60.0000 mg | Freq: Two times a day (BID) | INTRAMUSCULAR | Status: DC
  Administered 2013-04-28 – 2013-04-29 (×3): 60 mg via INTRAVENOUS
  Filled 2013-04-28 (×3): qty 6

## 2013-04-28 MED ORDER — ACETAMINOPHEN 325 MG PO TABS
650.0000 mg | ORAL_TABLET | Freq: Four times a day (QID) | ORAL | Status: DC | PRN
  Administered 2013-04-28 – 2013-05-09 (×4): 650 mg via ORAL
  Filled 2013-04-28 (×5): qty 2

## 2013-04-28 NOTE — ED Provider Notes (Signed)
CSN: [id number]     Arrival date & time 04/28/13  0019 History   This chart was scribed for Veryl Speak, MD, by Neta Ehlers, ED Scribe. This patient was seen in room APA15/APA15 and the patient's care was started at 12:25 AM.  None    Chief Complaint  Patient presents with  . Shortness of Breath    HPI HPI Comments: [name] [name] is a 73 y.o. female, with a h/o CHF and SOB, who presents to the Emergency Department complaining of SOB which has persisted for six days. She was discharged from [facility] two days ago; she reports she has been admitted to the hospital once or twice a month for the past three months due to similar symptoms. She does not use oxygen at home and she does not have a nebulizer. The pt denies chest pain, cough, fever, or lower extremity swelling increased from the baseline. She uses Lasix, 60 mg twice a day. She denies smoking. She also denies COPD.    Dr. Quintin Alto is her PCP.   She lives with her grandson.  Past Medical History  Diagnosis Date  . Congestive heart failure, unspecified   . Chronic airway obstruction, not elsewhere classified   . Precordial pain   . Swelling of limb   . Dizziness and giddiness   . Unspecified transient cerebral ischemia     Diag. 2003  . Acute, but ill-defined, cerebrovascular disease     2000 [place]  . Shortness of breath   . Obesity   . Juvenile rheumatic fever     age 68  . Unspecified essential hypertension   . Hyperlipidemia    Past Surgical History  Procedure Laterality Date  . Total knee arthroplasty    . Abdominal hysterectomy      Cervical Cancer  . Parathyroid/thyroid surgery      tumor  . Tee without cardioversion N/A 04/06/2013    Procedure: TRANSESOPHAGEAL ECHOCARDIOGRAM (TEE);  Surgeon: [name], MD;  Location: [facility];  Service: Cardiology;  Laterality: N/A;   Family History  Problem Relation Age of Onset  . Heart failure Father   . Heart attack Brother    History  Substance Use Topics   . Smoking status: Never Smoker   . Smokeless tobacco: Never Used  . Alcohol Use: No   No OB history provided.  Review of Systems  Constitutional: Negative for fever.  Respiratory: Positive for shortness of breath. Negative for cough.   Cardiovascular: Positive for leg swelling (Baseline). Negative for chest pain.  All other systems reviewed and are negative.    Allergies  Indomethacin and Norvasc  Home Medications   Current Outpatient Rx  Name  Route  Sig  Dispense  Refill  . acetaminophen (TYLENOL) 500 MG tablet   Oral   Take 500 mg by mouth every 8 (eight) hours as needed for moderate pain.         Marland Kitchen aspirin EC 81 MG tablet   Oral   Take 81 mg by mouth daily.         . Calcium Carbonate-Vitamin D (CALTRATE 600+D PO)   Oral   Take 1 tablet by mouth daily. Soft chew         . furosemide (LASIX) 40 MG tablet   Oral   Take 1.5 tablets (60 mg total) by mouth 2 (two) times daily.   90 tablet   6   . potassium chloride SA (K-DUR,KLOR-CON) 20 MEQ tablet   Oral  Take 1 tablet (20 mEq total) by mouth 2 (two) times daily.   60 tablet   6   . pravastatin (PRAVACHOL) 20 MG tablet   Oral   Take 20 mg by mouth at bedtime.          . solifenacin (VESICARE) 5 MG tablet   Oral   Take 5 mg by mouth daily.           . metoprolol tartrate (LOPRESSOR) 25 MG tablet   Oral   Take 1 tablet (25 mg total) by mouth 2 (two) times daily.   60 tablet   6    Triage Vitals: BP 121/67  Pulse 60  Temp(Src) 97.5 F (36.4 C) (Oral)  Resp 20  Ht 5\' 2"  (1.575 m)  Wt 193 lb (87.544 kg)  BMI 35.29 kg/m2  SpO2 98%  Physical Exam  Nursing note and vitals reviewed. Constitutional: She is oriented to person, place, and time. She appears well-developed and well-nourished. No distress.  HENT:  Head: Normocephalic and atraumatic.  Eyes: EOM are normal.  Neck: Neck supple. No tracheal deviation present.  Cardiovascular: Normal rate and normal heart sounds.    Pulmonary/Chest: Effort normal. No respiratory distress. She has rales.  Rales in bases bilaterally.   Abdominal: Soft. There is no tenderness.  Musculoskeletal: Normal range of motion. She exhibits edema.  2+ bilateral extremity edema.   Neurological: She is alert and oriented to person, place, and time.  Skin: Skin is warm and dry.  Psychiatric: She has a normal mood and affect. Her behavior is normal.    ED Course  Procedures (including critical care time)  DIAGNOSTIC STUDIES: Oxygen Saturation is 98% on [place], normal by my interpretation.    COORDINATION OF CARE:  12:29 AM. -Discussed treatment plan with patient, and the patient agreed to the plan.   Labs Review Labs Reviewed - No data to display Imaging Review No results found.  EKG Interpretation    Date/Time:  Saturday April 28 2013 01:25:35 EST Ventricular Rate:  60 PR Interval:  146 QRS Duration: 106 QT Interval:  462 QTC Calculation: 462 R Axis:   -32 Text Interpretation:  Normal sinus rhythm Left axis deviation Incomplete left bundle branch block Moderate voltage criteria for LVH, may be normal variant Nonspecific T wave abnormality Abnormal ECG When compared with ECG of 23-Apr-2013 01:52, Atrial Fibrillation is no longer present Confirmed by DELOS  MD, Stacey Sago ([id number]) on 04/28/2013 1:31:43 AM            MDM   Final diagnoses:  None    Patient is a 73 year old female with history of CHF. She was recently admitted here for an exacerbation. She was discharged 2 days ago. She now returns with increased difficulty breathing and low oxygen saturations. Chest x-ray looks like CHF and possibly pneumonia. She has no white count and no fever and in discussion with Dr. Shanon Brow from internal medicine, we have decided to hold off on antibiotics. She was given Lasix with good diuresis. She remains hypoxic and will be admitted to the medicine service.  I personally performed the services described in this documentation,  which was scribed in my presence. The recorded information has been reviewed and is accurate.      Veryl Speak, MD 04/28/13 [phone]

## 2013-04-28 NOTE — ED Notes (Signed)
Pt arrived by EMS from home pt complaining of SOB. Pt was just discharged from [facility] 2 days ago. Per EMS pt O2 sats were 92% on room air.

## 2013-04-28 NOTE — H&P (Signed)
PCP:   [name], MD   Chief Complaint:  Sob, le edema  HPI: 73 yo female h/o diastolic chf and mod to severe MR per TEE done last month by dr branch, htn, hld, h/o rheumatic fever as child comes in with one day of worsening sob and le swelling.  Pt was just discharged a couple of days ago from [place] after diastolic chf exac, she responded well to lasix 60mg  iv q 12 at that time.  Pt and dtr who is present said that all of her swelling was gone when she was discharged.  She is taking at home lasix 60mg  po bid.  She is very compliant with this, but still has gained wt.  She does not actually weigh herself, but her legs are starting to swell again.  No fevers.  No n/v/d.  No coughing.  Some worse sob but not as bad as it was last week.  Not on home oxygen.  She is scheduled to see dr branch this Thursday to discuss surgical intervention of her mitral valve or not.  She is also suffering from knee pain on left and is suppose to get steroid inj next week which cannot get surgery on that until her cardiac issues are situated, this knee issue has also limited her mobility some.  Pt has had significant decline in function since dec 2014 due to [place].  Review of Systems:  Positive and negative as per HPI otherwise all other systems are negative  Past Medical History: Past Medical History  Diagnosis Date  . Congestive heart failure, unspecified   . Chronic airway obstruction, not elsewhere classified   . Precordial pain   . Swelling of limb   . Dizziness and giddiness   . Unspecified transient cerebral ischemia     Diag. 2003  . Acute, but ill-defined, cerebrovascular disease     [address] [place]  . Shortness of breath   . Obesity   . Juvenile rheumatic fever     age 34  . Unspecified essential hypertension   . Hyperlipidemia    Past Surgical History  Procedure Laterality Date  . Total knee arthroplasty    . Abdominal hysterectomy      Cervical Cancer  . Parathyroid/thyroid  surgery      tumor  . Tee without cardioversion N/A 04/06/2013    Procedure: TRANSESOPHAGEAL ECHOCARDIOGRAM (TEE);  Surgeon: [name], MD;  Location: [facility];  Service: Cardiology;  Laterality: N/A;    Medications: Prior to Admission medications   Medication Sig Start Date End Date Taking? Authorizing Provider  acetaminophen (TYLENOL) 500 MG tablet Take 500 mg by mouth every 8 (eight) hours as needed for moderate pain.   Yes Historical Provider, MD  aspirin EC 81 MG tablet Take 81 mg by mouth daily.   Yes Historical Provider, MD  Calcium Carbonate-Vitamin D (CALTRATE 600+D PO) Take 1 tablet by mouth daily. Soft chew   Yes Historical Provider, MD  furosemide (LASIX) 40 MG tablet Take 1.5 tablets (60 mg total) by mouth 2 (two) times daily. 03/07/13  Yes [name], MD  potassium chloride SA (K-DUR,KLOR-CON) 20 MEQ tablet Take 1 tablet (20 mEq total) by mouth 2 (two) times daily. 03/07/13  Yes [name], MD  pravastatin (PRAVACHOL) 20 MG tablet Take 20 mg by mouth at bedtime.    Yes Historical Provider, MD  solifenacin (VESICARE) 5 MG tablet Take 5 mg by mouth daily.     Yes Historical  Provider, MD  metoprolol tartrate (LOPRESSOR) 25 MG tablet Take 1 tablet (25 mg total) by mouth 2 (two) times daily. 03/07/13   [name], MD    Allergies:   Allergies  Allergen Reactions  . Indomethacin Other (See Comments)    dizziness  . Norvasc [Amlodipine Besylate] Cough    Social History:  reports that she has never smoked. She has never used smokeless tobacco. She reports that she does not drink alcohol or use illicit drugs.  Family History: Family History  Problem Relation Age of Onset  . Heart failure Father   . Heart attack Brother     Physical Exam: Filed Vitals:   04/28/13 0025 04/28/13 0312 04/28/13 0355  BP: 121/67 108/43 105/65  Pulse: 60 57 60  Temp: 97.5 F (36.4 C) 97.9 F (36.6 C) 97.5 F (36.4 C)  TempSrc: Oral Oral Oral  Resp: 20 18  18   Height: 5\' 2"  (1.575 m)    Weight: 87.544 kg (193 lb)    SpO2: 98% 97% 92%   General appearance: alert, cooperative and no distress Head: Normocephalic, without obvious abnormality, atraumatic Eyes: negative Nose: Nares normal. Septum midline. Mucosa normal. No drainage or sinus tenderness. Neck: no JVD and supple, symmetrical, trachea midline Lungs: diminished breath sounds bibasilar Heart: regular rate and rhythm, S1, S2 normal, no murmur, click, rub or gallop Abdomen: soft, non-tender; bowel sounds normal; no masses,  no organomegaly Extremities: extremities normal, atraumatic, no cyanosis or edema and edema trace to 1+ ble edema Pulses: 2+ and symmetric Skin: Skin color, texture, turgor normal. No rashes or lesions Neurologic: Grossly normal  Labs on Admission:   Recent Labs  04/26/13 0940 04/28/13 0055  NA 137 143  K 4.1 4.1  CL 98 100  CO2 28 32  GLUCOSE 125* 110*  BUN 23 22  CREATININE 0.92 0.97  CALCIUM 9.2 9.8    Recent Labs  04/28/13 0055  AST 35  ALT 35  ALKPHOS 109  BILITOT 0.4  PROT 7.0  ALBUMIN 3.2*    Recent Labs  04/28/13 0055  WBC 4.7  NEUTROABS 2.6  HGB 12.8  HCT 38.0  MCV 95.7  PLT 212    Recent Labs  04/28/13 0055  TROPONINI <0.30   Radiological Exams on Admission: Dg Chest 2 View  04/28/2013   CLINICAL DATA:  Shortness of breath.  EXAM: CHEST  2 VIEW  COMPARISON:  Chest radiograph performed 04/23/2013  FINDINGS: Mild bibasilar airspace opacities may reflect atelectasis or mild pneumonia. No pleural effusion or pneumothorax is seen. The lungs are relatively well expanded.  The cardiomediastinal silhouette is mildly enlarged. No acute osseous abnormalities are identified.  IMPRESSION: Mild bibasilar airspace opacities may reflect atelectasis or mild pneumonia. Mild cardiomegaly noted.   Electronically Signed   By: Garald Balding M.D.   On: 04/28/2013 01:02   Dg Chest Port 1 View  04/23/2013   CLINICAL DATA:  Shortness of breath.   EXAM: PORTABLE CHEST - 1 VIEW  COMPARISON:  03/20/2013  FINDINGS: Cardiomegaly. Bilateral airspace opacities are noted, right greater than left. This likely reflects asymmetric edema. Cannot exclude infection. No visible effusions. No acute bony abnormality.  IMPRESSION: Cardiomegaly with vascular congestion and asymmetric bilateral airspace disease, right greater than left. I favor this represents asymmetric edema although infection is not excluded.   Electronically Signed   By: Rolm Baptise M.D.   On: 04/23/2013 02:25    Assessment/Plan  73 yo female with mild chf exacerbation from moderate to  severe MR   Principal Problem:   CHF exacerbation-  Seems she is not responding to lasix orally at home but responded to iv lasix during hosp last week.  Will place on same iv dosing lasix 60mg  q 12.  Would consider changing to demadex orally at discharge however.  Has appt with cards on this thurs.  If pt does not respond to above, consider transferring to [facility] to see her cardiologist sooner (during last hospitalization cards was consulted, but do not see a note).  i have also called the cardiology fellow on tonight dr. Delane Ginger who agrees with above plan- he also recommended trying demadex po at discharge this time, but could also discuss with her own cardiologist Monday morning if she responds well over the weekend.  She is mildly hypoxic with sats 88% RA, o/w vitals are stable.  Place on chf pathway.  Pt needs education about monitoring weights daily.   Active Problems:   Diastolic heart failure   HTN (hypertension)   A-fib   Mitral regurgitation moderate per TEE jan 2015 followed by Dr. Harl Bowie   Shortness of breath   Juvenile rheumatic fever  Of note,  Radiology read on cxr mentions possible pna.  Pt is afebrile and no leukocytosis.  bnp is up from last week.  Will not treat as pna at this time, hold of on abx unless spikes a fever, wbc increases or consider if has respiratory decline.  Would be  beneficial to repeat 2v cxr in 1-2 days after adequate diuresis before discharge.  Effa Yarrow A 04/28/2013, 4:57 AM

## 2013-04-28 NOTE — Progress Notes (Addendum)
PROGRESS NOTE    [name] [name] VQM:[id number] DOB: 1941/02/14 DOA: 04/28/2013 PCP: [name], MD  HPI/Brief narrative 73 year old female with history of chronic diastolic CHF, moderate to severe MR per TEE done last month by Dr. Carlyle Dolly, HTN, HLD, history of rheumatic fever as a child, CVA, chronic left knee pain awaiting intervention admitted on 04/28/13 with complaints of worsening dyspnea and leg swelling. She was recently discharged from [facility] after CHF exacerbation and did well with IV Lasix and her leg edema had apparently resolved. She has been compliant with home dose of Lasix 60 mg twice a day but continued to gain weight, leg swelling and worsening dyspnea. Admitted for CHF exacerbation.   Assessment/Plan:  1. Acute on chronic diastolic CHF: Apparently not responding to oral Lasix at home. Started on IV Lasix 60 mg every 12 hours. Clinically improving. Consider changing to oral Demadex at discharge. Will discuss with primary cardiologist on Monday. Followup chest x-ray. Unable to monitor strict intake and output secondary to chronic urinary incontinence-avoiding Foley catheter. 2. Acute hypoxic respiratory failure: Secondary to decompensated CHF. Treat CHF. Oxygen supplements. Monitor. 3. Left knee pain: Likely secondary to osteoarthritis. Pain management while inpatient and outpatient followup for intra-articular steroid. 4. Hypertension: Controlled. 5. Moderate to severe MR by TEE/history of rheumatic fever 6. Chronic urinary incontinence: States that she developed this after her prior stroke and uses depends at home.   Code Status: Full Family Communication: None at bedside Disposition Plan: Home when medically stable   Consultants:  None  Procedures:  None  Antibiotics:  None   Subjective: Feels much better. Dyspnea has improved and so has leg swellings. Complains of left knee pain not controlled by Tylenol or  Ultram.  Objective: Filed Vitals:   04/28/13 0352 04/28/13 0355 04/28/13 0500 04/28/13 0600  BP:  105/65  116/64  Pulse:  60  67  Temp:  97.5 F (36.4 C)  97.5 F (36.4 C)  TempSrc:  Oral  Oral  Resp:  18  18  Height:      Weight:   87.5 kg (192 lb 14.4 oz)   SpO2: 92% 92%  95%    Intake/Output Summary (Last 24 hours) at 04/28/13 1555 Last data filed at 04/28/13 0445  Gross per 24 hour  Intake    100 ml  Output      0 ml  Net    100 ml   Filed Weights   04/28/13 0025 04/28/13 0500  Weight: 87.544 kg (193 lb) 87.5 kg (192 lb 14.4 oz)     Exam:  General exam: Pleasant elderly female lying supine in bed. Respiratory system: Occasional basal crackles but otherwise clear to auscultation. No increased work of breathing. Cardiovascular system: S1 & S2 heard, RRR. No JVD, murmurs, gallops, clicks. 1+ pitting bilateral leg edema. Telemetry: Sinus rhythm. An episode of NSSVT. Gastrointestinal system: Abdomen is nondistended, soft and nontender. Normal bowel sounds heard. Central nervous system: Alert and oriented. No focal neurological deficits. Extremities: Symmetric 5 x 5 power. Left knee with mild chronic swelling and no other acute findings.   Data Reviewed: Basic Metabolic Panel:  Recent Labs Lab 04/23/13 0744 04/24/13 0500 04/25/13 0600 04/26/13 0940 04/28/13 0055  NA 141 142 139 137 143  K 4.1 4.4 4.5 4.1 4.1  CL 99 99 101 98 100  CO2 33* 32 34* 28 32  GLUCOSE 111* 112* 107* 125* 110*  BUN 20 27* 30* 23 22  CREATININE 1.10 1.25* 1.39*  0.92 0.97  CALCIUM 9.3 9.2 9.0 9.2 9.8   Liver Function Tests:  Recent Labs Lab 04/23/13 0744 04/28/13 0055  AST 23 35  ALT 22 35  ALKPHOS 106 109  BILITOT 0.4 0.4  PROT 6.5 7.0  ALBUMIN 3.1* 3.2*   No results found for this basename: LIPASE, AMYLASE,  in the last 168 hours No results found for this basename: AMMONIA,  in the last 168 hours CBC:  Recent Labs Lab 04/23/13 0158 04/23/13 0744 04/24/13 0500  04/28/13 0055  WBC 7.8 7.7 5.2 4.7  NEUTROABS  --  5.3  --  2.6  HGB 14.3 12.5 12.7 12.8  HCT 42.1 36.8 38.6 38.0  MCV 95.7 95.6 97.5 95.7  PLT 197 194 199 212   Cardiac Enzymes:  Recent Labs Lab 04/23/13 0744 04/24/13 1120 04/28/13 0055 04/28/13 0642 04/28/13 1232  TROPONINI <0.30 <0.30 <0.30 <0.30 <0.30   BNP (last 3 results)  Recent Labs  03/20/13 0324 04/23/13 0155 04/28/13 0055  PROBNP 757.6* 1031.0* 1163.0*   CBG: No results found for this basename: GLUCAP,  in the last 168 hours  No results found for this or any previous visit (from the past 240 hour(s)).    Studies: Dg Chest 2 View  04/28/2013   CLINICAL DATA:  Shortness of breath.  EXAM: CHEST  2 VIEW  COMPARISON:  Chest radiograph performed 04/23/2013  FINDINGS: Mild bibasilar airspace opacities may reflect atelectasis or mild pneumonia. No pleural effusion or pneumothorax is seen. The lungs are relatively well expanded.  The cardiomediastinal silhouette is mildly enlarged. No acute osseous abnormalities are identified.  IMPRESSION: Mild bibasilar airspace opacities may reflect atelectasis or mild pneumonia. Mild cardiomegaly noted.   Electronically Signed   By: Garald Balding M.D.   On: 04/28/2013 01:02        Scheduled Meds: . aspirin EC  81 mg Oral Daily  . furosemide  60 mg Intravenous Q12H  . potassium chloride SA  20 mEq Oral BID  . sodium chloride  3 mL Intravenous Q12H   Continuous Infusions:   Principal Problem:   CHF exacerbation Active Problems:   Diastolic heart failure   HTN (hypertension)   A-fib   Mitral regurgitation moderate per TEE jan 2015 followed by Dr. Harl Bowie   Shortness of breath   Juvenile rheumatic fever    Time spent: 19 minutes    Toye Rouillard, MD, FACP, FHM. [facility] Pager [phone]  If 7PM-7AM, please contact night-coverage [email or web address] Password [facility] 04/28/2013, 3:55 PM    LOS: 0 days

## 2013-04-28 NOTE — ED Notes (Signed)
Dr. David at bedside to evaluate patient. 

## 2013-04-29 ENCOUNTER — Inpatient Hospital Stay (HOSPITAL_COMMUNITY): Payer: Medicare HMO

## 2013-04-29 DIAGNOSIS — I4891 Unspecified atrial fibrillation: Secondary | ICD-10-CM | POA: Diagnosis not present

## 2013-04-29 LAB — BASIC METABOLIC PANEL
BUN: 28 mg/dL — ABNORMAL HIGH (ref 6–23)
CHLORIDE: 98 meq/L (ref 96–112)
CO2: 32 mEq/L (ref 19–32)
Calcium: 9 mg/dL (ref 8.4–10.5)
Creatinine, Ser: 1.14 mg/dL — ABNORMAL HIGH (ref 0.50–1.10)
GFR calc non Af Amer: 47 mL/min — ABNORMAL LOW (ref >90)
GFR, EST AFRICAN AMERICAN: 54 mL/min — AB (ref >90)
Glucose, Bld: 107 mg/dL — ABNORMAL HIGH (ref 70–99)
POTASSIUM: 4 meq/L (ref 3.7–5.3)
Sodium: 140 mEq/L (ref 137–147)

## 2013-04-29 LAB — TSH: TSH: 4.293 u[IU]/mL (ref 0.350–4.500)

## 2013-04-29 MED ORDER — DARIFENACIN HYDROBROMIDE ER 7.5 MG PO TB24
7.5000 mg | ORAL_TABLET | Freq: Every day | ORAL | Status: DC
  Administered 2013-04-29 – 2013-05-15 (×16): 7.5 mg via ORAL
  Filled 2013-04-29 (×20): qty 1

## 2013-04-29 MED ORDER — SIMVASTATIN 10 MG PO TABS
10.0000 mg | ORAL_TABLET | Freq: Every day | ORAL | Status: DC
  Administered 2013-04-29 – 2013-05-12 (×11): 10 mg via ORAL
  Filled 2013-04-29 (×15): qty 1

## 2013-04-29 MED ORDER — METOPROLOL TARTRATE 1 MG/ML IV SOLN
2.5000 mg | INTRAVENOUS | Status: AC | PRN
  Administered 2013-04-29 (×2): 2.5 mg via INTRAVENOUS
  Filled 2013-04-29 (×3): qty 5

## 2013-04-29 MED ORDER — METOPROLOL TARTRATE 25 MG PO TABS
25.0000 mg | ORAL_TABLET | Freq: Two times a day (BID) | ORAL | Status: DC
  Administered 2013-04-29 – 2013-05-01 (×5): 25 mg via ORAL
  Filled 2013-04-29 (×5): qty 1

## 2013-04-29 MED ORDER — FUROSEMIDE 10 MG/ML IJ SOLN
40.0000 mg | Freq: Two times a day (BID) | INTRAMUSCULAR | Status: DC
  Administered 2013-04-29: 40 mg via INTRAVENOUS
  Filled 2013-04-29: qty 4

## 2013-04-29 NOTE — Progress Notes (Signed)
Pts heart rhythm changed from NSR to a-fib with RVR this AM. MD was notified and he ordered metoprolol 25mg  PO and PRN metoprolol 2.5mg  IV push. Gave pt oral metoprolol and she went back into NSR. Will continue to monitor.

## 2013-04-29 NOTE — Progress Notes (Signed)
After pt received one dose of IV metoprolol push she has returned to NSR with a HR of 89. Will continue to monitor.

## 2013-04-29 NOTE — Progress Notes (Signed)
PROGRESS NOTE    [name] [name]:[id number] DOB: 1940/07/04 DOA: 04/28/2013 PCP: [name], MD  HPI/Brief narrative 73 year old female with history of chronic diastolic CHF, moderate to severe MR per TEE done last month by Dr. Carlyle Dolly, HTN, HLD, history of rheumatic fever as a child, CVA, chronic left knee pain awaiting intervention admitted on 04/28/13 with complaints of worsening dyspnea and leg swelling. She was recently discharged from [facility] after CHF exacerbation and did well with IV Lasix and her leg edema had apparently resolved. She has been compliant with home dose of Lasix 60 mg twice a day but continued to gain weight, leg swelling and worsening dyspnea. Admitted for CHF exacerbation.   Assessment/Plan:  1. Acute on chronic diastolic CHF: Apparently not responding to oral Lasix at home. Started on IV Lasix 60 mg every 12 hours. Clinically improving. Consider changing to oral Demadex at discharge. Will discuss with primary cardiologist on Monday. Unable to monitor strict intake and output secondary to chronic urinary incontinence-avoiding Foley catheter. Improving. Continue IV Lasix for additional 24 hours and then transition to oral Demadex. 2. A. fib with RVR/PAF: Patient was in sinus rhythm since admission until 8 AM on 2/22 when she went into rapid A. fib with ventricular rate in the 130s-140s-asymptomatic probably because home metoprolol had not been continued. Resumed same and patient went to sinus rhythm and again back in A. fib with RVR. When necessary IV metoprolol- if doesn't control may need to go on Cardizem drip. Patient voluntarily stopped Xarelto approximately a month ago following 2 episodes of nosebleeds-did not tell her cardiologist. Currently on aspirin. Will discuss with her cardiologist in a.m. 3. Acute hypoxic respiratory failure: Secondary to decompensated CHF. Treat CHF. Oxygen supplements. Monitor. 4. Left knee pain: Likely  secondary to osteoarthritis. Pain management while inpatient and outpatient followup for intra-articular steroid. 5. Hypertension: Controlled. 6. Moderate to severe MR by TEE/history of rheumatic fever 7. Chronic urinary incontinence: States that she developed this after her prior stroke and uses depends at home.   Code Status: Full Family Communication: Discussed with patient's daughter, granddaughter and sister at bedside. Disposition Plan: Home when medically stable   Consultants:  None  Procedures:  None  Antibiotics:  None   Subjective: Denies dyspnea, palpitations or chest pain. Leg swellings decreasing. Still with significant left knee pain.  Objective: Filed Vitals:   04/29/13 1057 04/29/13 1130 04/29/13 1525 04/29/13 1741  BP: 105/65 126/68 132/70 118/58  Pulse: 122 72 80 132  Temp:   98.1 F (36.7 C)   TempSrc:   Oral   Resp: 18 18 18 18   Height:      Weight:      SpO2: 94% 92% 93% 92%   No intake or output data in the 24 hours ending 04/29/13 1743 Filed Weights   04/28/13 0025 04/28/13 0500 04/29/13 0530  Weight: 87.544 kg (193 lb) 87.5 kg (192 lb 14.4 oz) 91 kg (200 lb 9.9 oz)     Exam:  General exam: Pleasant elderly female lying supine in bed. Respiratory system: Occasional basal crackles but otherwise clear to auscultation. No increased work of breathing. Cardiovascular system: S1 & S2 heard, RRR. No JVD, murmurs, gallops, clicks. 1+ pitting bilateral leg edema. Telemetry: A. fib with RVR in the 130s-140s  Gastrointestinal system: Abdomen is nondistended, soft and nontender. Normal bowel sounds heard. Central nervous system: Alert and oriented. No focal neurological deficits. Extremities: Symmetric 5 x 5 power. Left knee with mild chronic  swelling and no other acute findings.   Data Reviewed: Basic Metabolic Panel:  Recent Labs Lab 04/24/13 0500 04/25/13 0600 04/26/13 0940 04/28/13 0055 04/29/13 0629  NA 142 139 137 143 140  K 4.4 4.5  4.1 4.1 4.0  CL 99 101 98 100 98  CO2 32 34* 28 32 32  GLUCOSE 112* 107* 125* 110* 107*  BUN 27* 30* 23 22 28*  CREATININE 1.25* 1.39* 0.92 0.97 1.14*  CALCIUM 9.2 9.0 9.2 9.8 9.0   Liver Function Tests:  Recent Labs Lab 04/23/13 0744 04/28/13 0055  AST 23 35  ALT 22 35  ALKPHOS 106 109  BILITOT 0.4 0.4  PROT 6.5 7.0  ALBUMIN 3.1* 3.2*   No results found for this basename: LIPASE, AMYLASE,  in the last 168 hours No results found for this basename: AMMONIA,  in the last 168 hours CBC:  Recent Labs Lab 04/23/13 0158 04/23/13 0744 04/24/13 0500 04/28/13 0055  WBC 7.8 7.7 5.2 4.7  NEUTROABS  --  5.3  --  2.6  HGB 14.3 12.5 12.7 12.8  HCT 42.1 36.8 38.6 38.0  MCV 95.7 95.6 97.5 95.7  PLT 197 194 199 212   Cardiac Enzymes:  Recent Labs Lab 04/24/13 1120 04/28/13 0055 04/28/13 0642 04/28/13 1232 04/28/13 1849  TROPONINI <0.30 <0.30 <0.30 <0.30 <0.30   BNP (last 3 results)  Recent Labs  03/20/13 0324 04/23/13 0155 04/28/13 0055  PROBNP 757.6* 1031.0* 1163.0*   CBG: No results found for this basename: GLUCAP,  in the last 168 hours  No results found for this or any previous visit (from the past 240 hour(s)).    Studies: Dg Chest 1 View  04/29/2013   CLINICAL DATA:  CHF, followup, history leaky heart valve/mitral regurgitation by TEE  EXAM: CHEST - 1 VIEW  COMPARISON:  04/28/2013  FINDINGS: Enlargement of cardiac silhouette with pulmonary vascular congestion.  Slight chronic accentuation of interstitial markings likely represents minimal chronic failure.  No segmental consolidation, pleural effusion or pneumothorax.  Bones demineralized.  IMPRESSION: Minimal chronic CHF.   Electronically Signed   By: Lavonia Dana M.D.   On: 04/29/2013 12:59   Dg Chest 2 View  04/28/2013   CLINICAL DATA:  Shortness of breath.  EXAM: CHEST  2 VIEW  COMPARISON:  Chest radiograph performed 04/23/2013  FINDINGS: Mild bibasilar airspace opacities may reflect atelectasis or mild  pneumonia. No pleural effusion or pneumothorax is seen. The lungs are relatively well expanded.  The cardiomediastinal silhouette is mildly enlarged. No acute osseous abnormalities are identified.  IMPRESSION: Mild bibasilar airspace opacities may reflect atelectasis or mild pneumonia. Mild cardiomegaly noted.   Electronically Signed   By: Garald Balding M.D.   On: 04/28/2013 01:02        Scheduled Meds: . aspirin EC  81 mg Oral Daily  . darifenacin  7.5 mg Oral Daily  . furosemide  40 mg Intravenous BID  . metoprolol tartrate  25 mg Oral BID  . potassium chloride SA  20 mEq Oral BID  . simvastatin  10 mg Oral q1800  . sodium chloride  3 mL Intravenous Q12H   Continuous Infusions:   Principal Problem:   CHF exacerbation Active Problems:   Diastolic heart failure   HTN (hypertension)   A-fib   Mitral regurgitation moderate per TEE jan 2015 followed by Dr. Harl Bowie   Shortness of breath   Juvenile rheumatic fever    Time spent: 20 minutes    Artyom Stencel, MD, FACP, FHM. [facility] Pager [phone]  If 7PM-7AM, please contact night-coverage [email or web address] Password [facility] 04/29/2013, 5:43 PM    LOS: 1 day

## 2013-04-29 NOTE — Progress Notes (Signed)
Pt's rhythm went back to a-fib with RVR. MD made aware gave 2.5mg  of IV metoprolol push. HR down from 130s-140s and now in the 110s still in a-fib. Will continue to monitor. If pt's HR increases will give another dose of IV metoprolol push.

## 2013-04-30 DIAGNOSIS — N19 Unspecified kidney failure: Secondary | ICD-10-CM

## 2013-04-30 DIAGNOSIS — I10 Essential (primary) hypertension: Secondary | ICD-10-CM

## 2013-04-30 DIAGNOSIS — E785 Hyperlipidemia, unspecified: Secondary | ICD-10-CM

## 2013-04-30 DIAGNOSIS — I4891 Unspecified atrial fibrillation: Secondary | ICD-10-CM

## 2013-04-30 DIAGNOSIS — I059 Rheumatic mitral valve disease, unspecified: Secondary | ICD-10-CM

## 2013-04-30 DIAGNOSIS — I5033 Acute on chronic diastolic (congestive) heart failure: Principal | ICD-10-CM

## 2013-04-30 DIAGNOSIS — I00 Rheumatic fever without heart involvement: Secondary | ICD-10-CM

## 2013-04-30 DIAGNOSIS — I700 Atherosclerosis of aorta: Secondary | ICD-10-CM

## 2013-04-30 DIAGNOSIS — R040 Epistaxis: Secondary | ICD-10-CM

## 2013-04-30 DIAGNOSIS — Z8673 Personal history of transient ischemic attack (TIA), and cerebral infarction without residual deficits: Secondary | ICD-10-CM

## 2013-04-30 LAB — BASIC METABOLIC PANEL
BUN: 26 mg/dL — ABNORMAL HIGH (ref 6–23)
CHLORIDE: 101 meq/L (ref 96–112)
CO2: 33 mEq/L — ABNORMAL HIGH (ref 19–32)
Calcium: 9 mg/dL (ref 8.4–10.5)
Creatinine, Ser: 1.22 mg/dL — ABNORMAL HIGH (ref 0.50–1.10)
GFR calc Af Amer: 50 mL/min — ABNORMAL LOW (ref >90)
GFR calc non Af Amer: 43 mL/min — ABNORMAL LOW (ref >90)
Glucose, Bld: 98 mg/dL (ref 70–99)
POTASSIUM: 4.4 meq/L (ref 3.7–5.3)
Sodium: 141 mEq/L (ref 137–147)

## 2013-04-30 MED ORDER — APIXABAN 5 MG PO TABS
5.0000 mg | ORAL_TABLET | Freq: Two times a day (BID) | ORAL | Status: DC
  Administered 2013-04-30 – 2013-05-02 (×6): 5 mg via ORAL
  Filled 2013-04-30 (×8): qty 1

## 2013-04-30 MED ORDER — TORSEMIDE 20 MG PO TABS
20.0000 mg | ORAL_TABLET | Freq: Two times a day (BID) | ORAL | Status: DC
  Administered 2013-04-30 – 2013-05-01 (×3): 20 mg via ORAL
  Filled 2013-04-30 (×3): qty 1

## 2013-04-30 NOTE — Progress Notes (Signed)
Pt. In A. Fib with heart rate 130's - 140's.  Will give PRN order of 2.5mg  Metoprolol and continue to monitor patient.

## 2013-04-30 NOTE — Progress Notes (Signed)
UR chart review completed.  

## 2013-04-30 NOTE — Progress Notes (Signed)
After 2.5mg  of Metoprolol, patient's heart rate dropped to 65 and patient is back in SR.  Will continue to monitor.

## 2013-04-30 NOTE — Progress Notes (Signed)
04/30/13 1453 Patient ambulated in hallway with nursing standby assist. Ambulated approximately 50 feet, tolerated well. C/o general weakness and left knee weakness while ambulating. O2 sat 85% on r/a after ambulating, O2 reapplied at 2 lpm. Assisted up to chair, chair alarm on for safety. Call light within reach. Text-paged MD to notify of ambulation tolerance as requested. Donavan Foil, RN

## 2013-04-30 NOTE — Consult Note (Signed)
CARDIOLOGY CONSULT NOTE  [name] MRN: [id number] DOB/AGE: April 29, 1940 73 y.o.  Admit date: 04/28/2013 Primary Physician [name], MD Primary cardiologist: [place] cardiologist: Bronson Ing Reason for Consultation: MR, a fib with RVR, diastolic heart failure  HPI: The patient is a 73 yr old woman with mitral regurgitation (2 separate moderate jets seen by TEE with normal LV systolic function and dimensions, EF 60-65%) admitted with acute diastolic heart failure and subsequently developed rapid atrial fibrillation. BNP 1163 on admission, previously 757 on 1/13. She was recently discharged from [facility] for the treatment of acute diastolic heart failure and was discharged on Lasix 40 mg daily, but it appears she was taking her home dose of 60 mg bid, but gradually developed increasing dyspnea on exertion and leg swelling. Her ARB had been held at discharge due to acute renal insufficiency. Other PMH includes HTN, hyperlipidemia, CVA in 2000, and rheumatic disease as a child. She has been diuresed with IV Lasix and now switched to oral torsemide with KCl supplementation. She had stopped taking Xarelto on her own due to nosebleeds. She has also subsequently been started on metoprolol and is currently in normal sinus rhythm. She is scheduled to see Dr. Harl Bowie this Thursday, 2/26, to discuss TEE results and further management. She denies chest pain and says her breathing has improved and no longer has leg swelling.     Allergies  Allergen Reactions  . Indomethacin Other (See Comments)    dizziness  . Norvasc [Amlodipine Besylate] Cough    Current Facility-Administered Medications  Medication Dose Route Frequency Provider Last Rate Last Dose  . 0.9 %  sodium chloride infusion  250 mL Intravenous PRN Phillips Grout, MD      . acetaminophen (TYLENOL) tablet 650 mg  650 mg Oral Q6H PRN Modena Jansky, MD   650 mg at 04/29/13 2141  . aspirin EC tablet 81  mg  81 mg Oral Daily Phillips Grout, MD   81 mg at 04/30/13 1006  . darifenacin (ENABLEX) 24 hr tablet 7.5 mg  7.5 mg Oral Daily Modena Jansky, MD   7.5 mg at 04/30/13 1006  . HYDROcodone-acetaminophen (NORCO/VICODIN) 5-325 MG per tablet 1-2 tablet  1-2 tablet Oral Q6H PRN Modena Jansky, MD   2 tablet at 04/29/13 1212  . metoprolol tartrate (LOPRESSOR) tablet 25 mg  25 mg Oral BID Modena Jansky, MD   25 mg at 04/30/13 1006  . morphine 2 MG/ML injection 1 mg  1 mg Intravenous Q4H PRN Modena Jansky, MD   1 mg at 04/29/13 1452  . potassium chloride SA (K-DUR,KLOR-CON) CR tablet 20 mEq  20 mEq Oral BID Phillips Grout, MD   20 mEq at 04/30/13 1006  . simvastatin (ZOCOR) tablet 10 mg  10 mg Oral q1800 Modena Jansky, MD   10 mg at 04/29/13 1727  . sodium chloride 0.9 % injection 3 mL  3 mL Intravenous Q12H Phillips Grout, MD   3 mL at 04/30/13 1006  . sodium chloride 0.9 % injection 3 mL  3 mL Intravenous PRN Phillips Grout, MD      . torsemide (DEMADEX) tablet 20 mg  20 mg Oral BID Modena Jansky, MD   20 mg at 04/30/13 1006    Past Medical History  Diagnosis Date  . Congestive heart failure, unspecified   . Chronic airway obstruction, not elsewhere classified   . Precordial pain   .  Swelling of limb   . Dizziness and giddiness   . Unspecified transient cerebral ischemia     Diag. 2003  . Acute, but ill-defined, cerebrovascular disease     2000 [place]  . Shortness of breath   . Obesity   . Juvenile rheumatic fever     age 56  . Unspecified essential hypertension   . Hyperlipidemia     Past Surgical History  Procedure Laterality Date  . Total knee arthroplasty    . Abdominal hysterectomy      Cervical Cancer  . Parathyroid/thyroid surgery      tumor  . Tee without cardioversion N/A 04/06/2013    Procedure: TRANSESOPHAGEAL ECHOCARDIOGRAM (TEE);  Surgeon: [name], MD;  Location: [facility];  Service: Cardiology;  Laterality: N/A;    History   Social  History  . Marital Status: Widowed    Spouse Name: N/A    Number of Children: N/A  . Years of Education: N/A   Occupational History  . Not on file.   Social History Main Topics  . Smoking status: Never Smoker   . Smokeless tobacco: Never Used  . Alcohol Use: No  . Drug Use: No  . Sexual Activity: Not on file   Other Topics Concern  . 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  Prior to Admission medications   Medication Sig Start Date End Date Taking? Authorizing Provider  acetaminophen (TYLENOL) 500 MG tablet Take 500 mg by mouth every 8 (eight) hours as needed for moderate pain.   Yes Historical Provider, MD  aspirin EC 81 MG tablet Take 81 mg by mouth daily.   Yes Historical Provider, MD  Calcium Carbonate-Vitamin D (CALTRATE 600+D PO) Take 1 tablet by mouth daily. Soft chew   Yes Historical Provider, MD  furosemide (LASIX) 40 MG tablet Take 1.5 tablets (60 mg total) by mouth 2 (two) times daily. 03/07/13  Yes [name], MD  losartan (COZAAR) 50 MG tablet Take 50 mg by mouth daily.   Yes Historical Provider, MD  metoprolol tartrate (LOPRESSOR) 25 MG tablet Take 1 tablet (25 mg total) by mouth 2 (two) times daily. 03/07/13  Yes [name], MD  potassium chloride SA (K-DUR,KLOR-CON) 20 MEQ tablet Take 1 tablet (20 mEq total) by mouth 2 (two) times daily. 03/07/13  Yes [name], MD  pravastatin (PRAVACHOL) 20 MG tablet Take 20 mg by mouth at bedtime.    Yes Historical Provider, MD  solifenacin (VESICARE) 5 MG tablet Take 5 mg by mouth daily.     Yes Historical Provider, MD  telmisartan (MICARDIS) 40 MG tablet Take 40 mg by mouth 2 (two) times daily.   Yes Historical Provider, MD     Review of systems complete and found to be negative unless listed above in HPI     Physical exam Blood  pressure 113/67, pulse 87, temperature 97.4 F (36.3 C), temperature source Oral, resp. rate 18, height 5\' 2"  (1.575 m), weight 204 lb 2.3 oz (92.6 kg), SpO2 97.00%. General: NAD Neck: No JVD, no thyromegaly or thyroid nodule.  Lungs: Clear to auscultation bilaterally with normal respiratory effort. CV: Nondisplaced PMI.  Heart regular S1/S2, no S3/S4, III/VI holosystolic murmur along left sternal border and apex.  Trace peripheral leg edema.  No carotid bruit.  Normal pedal pulses.  Abdomen: Soft, nontender, no hepatosplenomegaly, no distention.  Skin: Intact without lesions or rashes.  Neurologic: Alert and oriented x 3.  Psych: Normal affect. Extremities: No clubbing or cyanosis.  HEENT: Normal.   Labs:   Lab Results  Component Value Date   WBC 4.7 04/28/2013   HGB 12.8 04/28/2013   HCT 38.0 04/28/2013   MCV 95.7 04/28/2013   PLT 212 04/28/2013    Recent Labs Lab 04/28/13 0055  04/30/13 0532  NA 143  < > 141  K 4.1  < > 4.4  CL 100  < > 101  CO2 32  < > 33*  BUN 22  < > 26*  CREATININE 0.97  < > 1.22*  CALCIUM 9.8  < > 9.0  PROT 7.0  --   --   BILITOT 0.4  --   --   ALKPHOS 109  --   --   ALT 35  --   --   AST 35  --   --   GLUCOSE 110*  < > 98  < > = values in this interval not displayed. Lab Results  Component Value Date   TROPONINI <0.30 04/28/2013    No results found for this basename: CHOL   No results found for this basename: HDL   No results found for this basename: LDLCALC   No results found for this basename: TRIG   No results found for this basename: CHOLHDL   No results found for this basename: LDLDIRECT       ECG (2/22): atrial fibrillation with RVR, 132 bpm, LVH with repolarization abnormalities, nonspecific IVCD  Studies: Dg Chest 1 View  04/29/2013   CLINICAL DATA:  CHF, followup, history leaky heart valve/mitral regurgitation by TEE  EXAM: CHEST - 1 VIEW  COMPARISON:  04/28/2013  FINDINGS: Enlargement of cardiac silhouette with pulmonary  vascular congestion.  Slight chronic accentuation of interstitial markings likely represents minimal chronic failure.  No segmental consolidation, pleural effusion or pneumothorax.  Bones demineralized.  IMPRESSION: Minimal chronic CHF.   Electronically Signed   By: Lavonia Dana M.D.   On: 04/29/2013 12:59    ASSESSMENT AND PLAN:  1. Acute on chronic diastolic heart failure: I agree with current oral diuretic regimen of torsemide with KCl supplementation. BP normal at 113/67. BNP 1163 on admission, previously 757 on 1/13. Continue metoprolol at current dose. Overall status being driven by mitral regurgitation. She is scheduled to see Dr. Harl Bowie this Thursday, 2/26, to discuss further management. Of note, she has both normal LV systolic function (EF 123456) and dimensions, with 2 distinct moderate mitral regurgitant jets seen by TEE. 2. Atrial fibrillation: her CHADS-Vasc score is 7 (aortic plaque also demonstrated by TEE, along with CHF, HTN, prior CVA, age, gender) putting her at a high risk for future thromboembolic events. Given that she did not tolerate Xarelto due to nosebleeds, we discussed the option of trying Eliquis, understanding that all anticoagulants put her at an increased risk for bleeding. She is willing to attempt Eliquis. I will start 5 mg bid and d/c ASA. Continue metoprolol 25 mg bid. Her atrial fibrillation is also being driven by her mitral regurgitation with consequent moderate to severe left atrial enlargement. 3. Mitral regurgitation: She has both normal LV systolic function (EF 123456) and dimensions, with 2 distinct moderate mitral regurgitant jets seen by TEE. She has not had a cardiac catheterization before. Further management will be  deferred to her primary cardiologist, Dr. Harl Bowie. 4. HTN: controlled on present therapy. 5. Hyperlipidemia: on pravastatin as outpatient.   Signed: Kate Sable, M.D., F.A.C.C.  04/30/2013, 11:42 AM

## 2013-04-30 NOTE — Progress Notes (Signed)
PROGRESS NOTE    [name] [name] UJW:[id number] DOB: 08/03/1940 DOA: 04/28/2013 PCP: [name], MD  HPI/Brief narrative 73 year old female with history of chronic diastolic CHF, moderate to severe MR per TEE done last month by Dr. Carlyle Dolly, HTN, HLD, history of rheumatic fever as a child, CVA, chronic left knee pain awaiting intervention admitted on 04/28/13 with complaints of worsening dyspnea and leg swelling. She was recently discharged from [facility] after CHF exacerbation and did well with IV Lasix and her leg edema had apparently resolved. She has been compliant with home dose of Lasix 60 mg twice a day but continued to gain weight, leg swelling and worsening dyspnea. Admitted for CHF exacerbation.   Assessment/Plan:  1. Acute on chronic diastolic CHF: Apparently not responding to oral Lasix at home. Started on IV Lasix 60 mg every 12 hours. Clinically improving. Unable to monitor strict intake and output secondary to chronic urinary incontinence-avoiding Foley catheter. Improving. Change IV Lasix to oral Demadex 2/23. 2. A. fib with RVR/PAF: Patient was in sinus rhythm since admission until 8 AM on 2/22 when she went into rapid A. fib with ventricular rate in the 130s-140s-asymptomatic probably because home metoprolol had not been continued. Resumed same and has had paroxysmal A. fib with RVR but currently in sinus rhythm. Patient voluntarily stopped Xarelto approximately a month ago following 2 episodes of nosebleeds-did not tell her cardiologist. Currently on aspirin. Cardiology has discussed with patient and started her on a Eliquis for anticoagulation and discontinued aspirin. Continue current dose of metoprolol 25 mg twice a day. Her A. fib is also being driven by MR. 3. Acute hypoxic respiratory failure: Secondary to decompensated CHF. Treat CHF. Oxygen supplements. Monitor. She may require home O2. 4. Left knee pain: Likely secondary to osteoarthritis. Pain  management while inpatient and outpatient followup for intra-articular steroid. 5. Hypertension: Controlled. 6. Moderate to severe MR by TEE/history of rheumatic fever: Further management deferred to her primary cardiologist, possibly in the outpatient setting. 7. Chronic urinary incontinence: States that she developed this after her prior stroke and uses depends at home.   Code Status: Full Family Communication: Discussed with patient's sister at bedside Disposition Plan: Home when medically stable   Consultants:  Cardiology  Procedures:  None  Antibiotics:  None   Subjective: Denies dyspnea, palpitations or chest pain. Leg swellings decreasing. Improved left knee pain..  Objective: Filed Vitals:   04/30/13 0551 04/30/13 1005 04/30/13 1357 04/30/13 1359  BP: 89/44 113/67  119/55  Pulse: 62 87  63  Temp: 97.2 F (36.2 C) 97.4 F (36.3 C)  97.4 F (36.3 C)  TempSrc: Oral Oral  Oral  Resp: 20 18  20   Height:      Weight: 92.6 kg (204 lb 2.3 oz)     SpO2: 97% 97% 85% 96%    Intake/Output Summary (Last 24 hours) at 04/30/13 1710 Last data filed at 04/30/13 1350  Gross per 24 hour  Intake      3 ml  Output    200 ml  Net   -197 ml   Filed Weights   04/28/13 0500 04/29/13 0530 04/30/13 0551  Weight: 87.5 kg (192 lb 14.4 oz) 91 kg (200 lb 9.9 oz) 92.6 kg (204 lb 2.3 oz)     Exam:  General exam: Pleasant elderly female lying supine in bed. Respiratory system: Occasional basal crackles but otherwise clear to auscultation. No increased work of breathing. Cardiovascular system: S1 & S2 heard, RRR. No JVD,  murmurs, gallops, clicks. Trace pitting bilateral leg edema. Telemetry: Paroxysmal A. fib with RVR. In sinus rhythm this morning. Gastrointestinal system: Abdomen is nondistended, soft and nontender. Normal bowel sounds heard. Central nervous system: Alert and oriented. No focal neurological deficits. Extremities: Symmetric 5 x 5 power. Left knee with mild chronic  swelling and no other acute findings.   Data Reviewed: Basic Metabolic Panel:  Recent Labs Lab 04/25/13 0600 04/26/13 0940 04/28/13 0055 04/29/13 0629 04/30/13 0532  NA 139 137 143 140 141  K 4.5 4.1 4.1 4.0 4.4  CL 101 98 100 98 101  CO2 34* 28 32 32 33*  GLUCOSE 107* 125* 110* 107* 98  BUN 30* 23 22 28* 26*  CREATININE 1.39* 0.92 0.97 1.14* 1.22*  CALCIUM 9.0 9.2 9.8 9.0 9.0   Liver Function Tests:  Recent Labs Lab 04/28/13 0055  AST 35  ALT 35  ALKPHOS 109  BILITOT 0.4  PROT 7.0  ALBUMIN 3.2*   No results found for this basename: LIPASE, AMYLASE,  in the last 168 hours No results found for this basename: AMMONIA,  in the last 168 hours CBC:  Recent Labs Lab 04/24/13 0500 04/28/13 0055  WBC 5.2 4.7  NEUTROABS  --  2.6  HGB 12.7 12.8  HCT 38.6 38.0  MCV 97.5 95.7  PLT 199 212   Cardiac Enzymes:  Recent Labs Lab 04/24/13 1120 04/28/13 0055 04/28/13 0642 04/28/13 1232 04/28/13 1849  TROPONINI <0.30 <0.30 <0.30 <0.30 <0.30   BNP (last 3 results)  Recent Labs  03/20/13 0324 04/23/13 0155 04/28/13 0055  PROBNP 757.6* 1031.0* 1163.0*   CBG: No results found for this basename: GLUCAP,  in the last 168 hours  No results found for this or any previous visit (from the past 240 hour(s)).    Studies: Dg Chest 1 View  04/29/2013   CLINICAL DATA:  CHF, followup, history leaky heart valve/mitral regurgitation by TEE  EXAM: CHEST - 1 VIEW  COMPARISON:  04/28/2013  FINDINGS: Enlargement of cardiac silhouette with pulmonary vascular congestion.  Slight chronic accentuation of interstitial markings likely represents minimal chronic failure.  No segmental consolidation, pleural effusion or pneumothorax.  Bones demineralized.  IMPRESSION: Minimal chronic CHF.   Electronically Signed   By: Lavonia Dana M.D.   On: 04/29/2013 12:59        Scheduled Meds: . apixaban  5 mg Oral BID  . darifenacin  7.5 mg Oral Daily  . metoprolol tartrate  25 mg Oral BID    . potassium chloride SA  20 mEq Oral BID  . simvastatin  10 mg Oral q1800  . sodium chloride  3 mL Intravenous Q12H  . torsemide  20 mg Oral BID   Continuous Infusions:   Principal Problem:   Diastolic CHF, acute on chronic Active Problems:   Diastolic heart failure   HTN (hypertension)   A-fib   Mitral regurgitation moderate per TEE jan 2015 followed by Dr. Harl Bowie   Shortness of breath   Juvenile rheumatic fever   Atrial fibrillation with RVR    Time spent: 25 minutes    Laurice Kimmons, MD, FACP, FHM. [facility] Pager [phone]  If 7PM-7AM, please contact night-coverage [email or web address] Password [email or web address] 04/30/2013, 5:10 PM    LOS: 2 days

## 2013-05-01 ENCOUNTER — Inpatient Hospital Stay (HOSPITAL_COMMUNITY): Payer: Medicare HMO

## 2013-05-01 LAB — BASIC METABOLIC PANEL
BUN: 24 mg/dL — ABNORMAL HIGH (ref 6–23)
CHLORIDE: 100 meq/L (ref 96–112)
CO2: 33 meq/L — AB (ref 19–32)
Calcium: 9.3 mg/dL (ref 8.4–10.5)
Creatinine, Ser: 1.07 mg/dL (ref 0.50–1.10)
GFR calc Af Amer: 59 mL/min — ABNORMAL LOW (ref >90)
GFR calc non Af Amer: 51 mL/min — ABNORMAL LOW (ref >90)
Glucose, Bld: 95 mg/dL (ref 70–99)
POTASSIUM: 4.3 meq/L (ref 3.7–5.3)
Sodium: 142 mEq/L (ref 137–147)

## 2013-05-01 LAB — MRSA PCR SCREENING: MRSA BY PCR: NEGATIVE

## 2013-05-01 MED ORDER — IPRATROPIUM-ALBUTEROL 0.5-2.5 (3) MG/3ML IN SOLN
3.0000 mL | RESPIRATORY_TRACT | Status: DC
  Administered 2013-05-02 – 2013-05-03 (×10): 3 mL via RESPIRATORY_TRACT
  Filled 2013-05-01 (×10): qty 3

## 2013-05-01 MED ORDER — ALBUTEROL SULFATE (2.5 MG/3ML) 0.083% IN NEBU
2.5000 mg | INHALATION_SOLUTION | Freq: Four times a day (QID) | RESPIRATORY_TRACT | Status: DC | PRN
  Administered 2013-05-01: 2.5 mg via RESPIRATORY_TRACT
  Filled 2013-05-01: qty 3

## 2013-05-01 MED ORDER — AMIODARONE HCL IN DEXTROSE 360-4.14 MG/200ML-% IV SOLN
30.0000 mg/h | INTRAVENOUS | Status: DC
  Administered 2013-05-01: 60 mg/h via INTRAVENOUS
  Administered 2013-05-01 – 2013-05-02 (×2): 30 mg/h via INTRAVENOUS
  Filled 2013-05-01 (×4): qty 200

## 2013-05-01 MED ORDER — LORAZEPAM 2 MG/ML IJ SOLN
0.5000 mg | Freq: Once | INTRAMUSCULAR | Status: AC
  Administered 2013-05-01: 0.5 mg via INTRAVENOUS
  Filled 2013-05-01: qty 1

## 2013-05-01 MED ORDER — AMIODARONE HCL IN DEXTROSE 360-4.14 MG/200ML-% IV SOLN
60.0000 mg/h | INTRAVENOUS | Status: AC
  Administered 2013-05-01 (×4): 60 mg/h via INTRAVENOUS
  Filled 2013-05-01 (×2): qty 200

## 2013-05-01 MED ORDER — AMIODARONE LOAD VIA INFUSION
150.0000 mg | Freq: Once | INTRAVENOUS | Status: AC
  Administered 2013-05-01: 150 mg via INTRAVENOUS
  Filled 2013-05-01: qty 83.34

## 2013-05-01 MED ORDER — ALBUTEROL SULFATE (2.5 MG/3ML) 0.083% IN NEBU
INHALATION_SOLUTION | RESPIRATORY_TRACT | Status: AC
  Filled 2013-05-01: qty 3

## 2013-05-01 MED ORDER — METOPROLOL TARTRATE 50 MG PO TABS
50.0000 mg | ORAL_TABLET | Freq: Two times a day (BID) | ORAL | Status: DC
  Administered 2013-05-01 – 2013-05-05 (×7): 50 mg via ORAL
  Filled 2013-05-01 (×9): qty 1

## 2013-05-01 MED ORDER — FUROSEMIDE 10 MG/ML IJ SOLN
60.0000 mg | Freq: Two times a day (BID) | INTRAMUSCULAR | Status: DC
  Administered 2013-05-01 – 2013-05-05 (×7): 60 mg via INTRAVENOUS
  Filled 2013-05-01 (×9): qty 6

## 2013-05-01 NOTE — Progress Notes (Addendum)
PROGRESS NOTE    [name] [name] PYK:[id number] DOB: 1940/08/21 DOA: 04/28/2013 PCP: [name], MD Primary Cardiologist: Dr. Carlyle Dolly  HPI/Brief narrative 73 year old female with history of chronic diastolic CHF, moderate to severe MR per TEE done last month by Dr. Carlyle Dolly, HTN, HLD, history of rheumatic fever as a child, CVA, chronic left knee pain awaiting intervention admitted on 04/28/13 with complaints of worsening dyspnea and leg swelling. She was recently discharged from [facility] after CHF exacerbation and did well with IV Lasix and her leg edema had apparently resolved. She has been compliant with home dose of Lasix 60 mg twice a day but continued to gain weight, leg swelling and worsening dyspnea. Admitted for CHF exacerbation and did well after IV Lasix and was transitioned to oral Demadex. Currently having proximal A. fib with RVR-cardiology transferred patient to step down for amiodarone drip.  Assessment/Plan:  1. Acute on chronic diastolic CHF: Apparently not responding to oral Lasix at home. Patient was treated with IV Lasix for a couple of days and after improved mental, transitioned to oral Demadex. As per cardiology, overall status is being driven by mitral regurgitation. Metoprolol being increased to 50 mg twice a day. 2. A. fib with RVR/PAF: Patient was in sinus rhythm since admission until 8 AM on 2/22 when she went into rapid A. fib with ventricular rate in the 130s-140s-asymptomatic. This was probably because home metoprolol had not been continued. Despite resuming home dose of metoprolol, she continues to be in PAF with RVR in the 110s. Cardiology following-increased metoprolol to 50 mg twice a day and transferring to step down unit for amiodarone infusion. Her atrial fibrillation is also being driven by her mitral regurgitation with consequent moderate to severe left atrial enlargement. Patient voluntarily stopped Xarelto approximately a  month ago following 2 episodes of nosebleeds-did not tell her cardiologist. Cardiology started her on a Eliquis for anticoagulation and discontinued aspirin (Copay for Eliquis is $6.60 for 30 days supply and no pre auth required).  Her CHADS-Vasc score is 7 (aortic plaque also demonstrated by TEE, along with CHF, HTN, prior CVA, age, gender) putting her at a high risk for future thromboembolic events. 3. Acute hypoxic respiratory failure: Secondary to decompensated CHF. Treat CHF. Oxygen supplements. Monitor. She may require home O2. 4. Left knee pain: Likely secondary to osteoarthritis. Pain management while inpatient and outpatient followup for intra-articular steroid. 5. Hypertension: Controlled. 6. Moderate to severe MR by TEE/history of rheumatic fever: She has both normal LV systolic function (EF 53-97%) and dimensions, with 2 distinct moderate mitral regurgitant jets seen by TEE. She has not had a cardiac catheterization before. Further management will be deferred to her primary cardiologist, Dr. Harl Bowie.  7. Chronic urinary incontinence: States that she developed this after her prior stroke and uses depends at home. 8. Hyperlipidemia: On pravastatin as outpatient.   Code Status: Full Family Communication: None at bedside. Disposition Plan: Transfer to step down unit   Consultants:  Cardiology  Procedures:  None  Antibiotics:  None   Subjective: Left knee pain is worse today. Denies dyspnea, chest pain or palpitations.  Objective: Filed Vitals:   05/01/13 1300 05/01/13 1315 05/01/13 1330 05/01/13 1345  BP: 125/59 115/62 107/59 109/61  Pulse:    115  Temp:      TempSrc:      Resp: 14 19 16 20   Height:      Weight:      SpO2:    95%  Intake/Output Summary (Last 24 hours) at 05/01/13 1402 Last data filed at 05/01/13 1300  Gross per 24 hour  Intake    480 ml  Output    750 ml  Net   -270 ml   Filed Weights   04/29/13 0530 04/30/13 0551 05/01/13 0815  Weight: 91  kg (200 lb 9.9 oz) 92.6 kg (204 lb 2.3 oz) 91.173 kg (201 lb)     Exam:  General exam: Pleasant elderly female lying supine in bed. Respiratory system: Occasional basal crackles but otherwise clear to auscultation. No increased work of breathing. Cardiovascular system: S1 & S2 heard, RRR. No JVD, murmurs, gallops, clicks. Trace pitting bilateral leg edema. Telemetry: Paroxysmal A. fib with RVR- With ventricular rate in the 110s. Gastrointestinal system: Abdomen is nondistended, soft and nontender. Normal bowel sounds heard. Central nervous system: Alert and oriented. No focal neurological deficits. Extremities: Symmetric 5 x 5 power. Left knee with mild chronic swelling and no other acute findings.   Data Reviewed: Basic Metabolic Panel:  Recent Labs Lab 04/26/13 0940 04/28/13 0055 04/29/13 0629 04/30/13 0532 05/01/13 0505  NA 137 143 140 141 142  K 4.1 4.1 4.0 4.4 4.3  CL 98 100 98 101 100  CO2 28 32 32 33* 33*  GLUCOSE 125* 110* 107* 98 95  BUN 23 22 28* 26* 24*  CREATININE 0.92 0.97 1.14* 1.22* 1.07  CALCIUM 9.2 9.8 9.0 9.0 9.3   Liver Function Tests:  Recent Labs Lab 04/28/13 0055  AST 35  ALT 35  ALKPHOS 109  BILITOT 0.4  PROT 7.0  ALBUMIN 3.2*   No results found for this basename: LIPASE, AMYLASE,  in the last 168 hours No results found for this basename: AMMONIA,  in the last 168 hours CBC:  Recent Labs Lab 04/28/13 0055  WBC 4.7  NEUTROABS 2.6  HGB 12.8  HCT 38.0  MCV 95.7  PLT 212   Cardiac Enzymes:  Recent Labs Lab 04/28/13 0055 04/28/13 0642 04/28/13 1232 04/28/13 1849  TROPONINI <0.30 <0.30 <0.30 <0.30   BNP (last 3 results)  Recent Labs  03/20/13 0324 04/23/13 0155 04/28/13 0055  PROBNP 757.6* 1031.0* 1163.0*   CBG: No results found for this basename: GLUCAP,  in the last 168 hours  No results found for this or any previous visit (from the past 240 hour(s)).    Studies: No results found.      Scheduled Meds: .  apixaban  5 mg Oral BID  . darifenacin  7.5 mg Oral Daily  . metoprolol tartrate  50 mg Oral BID  . potassium chloride SA  20 mEq Oral BID  . simvastatin  10 mg Oral q1800  . sodium chloride  3 mL Intravenous Q12H  . torsemide  20 mg Oral BID   Continuous Infusions: . amiodarone (NEXTERONE PREMIX) 360 mg/200 mL dextrose 60 mg/hr (05/01/13 1100)   Followed by  . amiodarone (NEXTERONE PREMIX) 360 mg/200 mL dextrose      Principal Problem:   Diastolic CHF, acute on chronic Active Problems:   Diastolic heart failure   HTN (hypertension)   A-fib   Mitral regurgitation moderate per TEE jan 2015 followed by Dr. Harl Bowie   Shortness of breath   Juvenile rheumatic fever   Atrial fibrillation with RVR    Time spent: 25 minutes    HONGALGI,ANAND, MD, FACP, FHM. [facility] Pager [phone]  If 7PM-7AM, please contact night-coverage [email or web address] Password [email or web address] 05/01/2013, 2:02 PM    LOS: 3 days

## 2013-05-01 NOTE — Progress Notes (Signed)
Pt transferred to ICU per Dr. Jacinta Shoe for A.fib with RVR. Pt's IV site patent and WNL. Pt VSS at transfer. Report called to [place], Therapist, sports. Pt transferred to ICU room 1. Daughter made aware per telephone per patient.

## 2013-05-01 NOTE — Progress Notes (Signed)
Addendum  As per nursing report, patient starting to complain of worsening dyspnea this evening with increasing oxygen requirement and decreasing urine output. Chest x-ray shows pulmonary edema.  DC torsemide and start Lasix 60 mg IV every 12 hours. Monitor closely.  Vernell Leep, MD, FACP, FHM. [facility] Pager [phone]  If 7PM-7AM, please contact night-coverage [email or web address] Password [facility] 05/01/2013, 6:37 PM

## 2013-05-01 NOTE — Progress Notes (Addendum)
PT C/O SOB AT REST. FINE RALES HEARD. O2 INCREASED TO 4L/MIN VIA [place].  URINARY OUTPUT 750CC THIS 12HRS. DR HONGALGI NOTIFED AND STAT PORTABLE CRX ORDERED.

## 2013-05-01 NOTE — Progress Notes (Signed)
SUBJECTIVE: Pt feels tired. Main complaint is knee pain. Says both shortness of breath and leg swelling have improved significantly.     Intake/Output Summary (Last 24 hours) at 05/01/13 1103 Last data filed at 05/01/13 0815  Gross per 24 hour  Intake      0 ml  Output    950 ml  Net   -950 ml    Current Facility-Administered Medications  Medication Dose Route Frequency Provider Last Rate Last Dose  . 0.9 %  sodium chloride infusion  250 mL Intravenous PRN Phillips Grout, MD      . acetaminophen (TYLENOL) tablet 650 mg  650 mg Oral Q6H PRN Modena Jansky, MD   650 mg at 04/29/13 2141  . amiodarone (NEXTERONE) 1.8 mg/mL load via infusion 150 mg  150 mg Intravenous Once Herminio Commons, MD       Followed by  . amiodarone (NEXTERONE PREMIX) 360 mg/200 mL dextrose IV infusion  60 mg/hr Intravenous Continuous Herminio Commons, MD       Followed by  . amiodarone (NEXTERONE PREMIX) 360 mg/200 mL dextrose IV infusion  30 mg/hr Intravenous Continuous Herminio Commons, MD      . apixaban (ELIQUIS) tablet 5 mg  5 mg Oral BID Herminio Commons, MD   5 mg at 05/01/13 1008  . darifenacin (ENABLEX) 24 hr tablet 7.5 mg  7.5 mg Oral Daily Modena Jansky, MD   7.5 mg at 05/01/13 0808  . HYDROcodone-acetaminophen (NORCO/VICODIN) 5-325 MG per tablet 1-2 tablet  1-2 tablet Oral Q6H PRN Modena Jansky, MD   2 tablet at 05/01/13 0808  . metoprolol (LOPRESSOR) tablet 50 mg  50 mg Oral BID Herminio Commons, MD      . morphine 2 MG/ML injection 1 mg  1 mg Intravenous Q4H PRN Modena Jansky, MD   1 mg at 04/29/13 1452  . potassium chloride SA (K-DUR,KLOR-CON) CR tablet 20 mEq  20 mEq Oral BID Phillips Grout, MD   20 mEq at 05/01/13 6440  . simvastatin (ZOCOR) tablet 10 mg  10 mg Oral q1800 Modena Jansky, MD   10 mg at 04/30/13 1741  . sodium chloride 0.9 % injection 3 mL  3 mL Intravenous Q12H Phillips Grout, MD   3 mL at 05/01/13 3474  . sodium chloride 0.9 % injection 3 mL   3 mL Intravenous PRN Phillips Grout, MD      . torsemide [facility]) tablet 20 mg  20 mg Oral BID Modena Jansky, MD   20 mg at 05/01/13 0808    Filed Vitals:   04/30/13 1359 04/30/13 1742 04/30/13 2101 05/01/13 0815  BP: 119/55  119/69 126/71  Pulse: 63  88 103  Temp: 97.4 F (36.3 C)  97.6 F (36.4 C) 97.6 F (36.4 C)  TempSrc: Oral  Oral Oral  Resp: 20  20 20   Height:      Weight:    201 lb (91.173 kg)  SpO2: 96% 99% 94% 95%    PHYSICAL EXAM General: NAD  Neck: No JVD, no thyromegaly or thyroid nodule.  Lungs: Clear to auscultation bilaterally with normal respiratory effort.  CV: Nondisplaced PMI. Heart regular S1/S2, no S3/S4, III/VI holosystolic murmur along left sternal border and apex. Trace peripheral leg edema. No carotid bruit. Normal pedal pulses.  Abdomen: Soft, nontender, no hepatosplenomegaly, no distention.  Skin: Intact without lesions or rashes.  Neurologic: Alert and oriented  x 3.  Psych: Normal affect.  Extremities: No clubbing or cyanosis.  HEENT: Normal.    TELEMETRY: Reviewed telemetry pt in normal sinus rhythm but with several paroxysms of rapid atrial fibrillation.  LABS: Basic Metabolic Panel:  Recent Labs  04/30/13 0532 05/01/13 0505  NA 141 142  K 4.4 4.3  CL 101 100  CO2 33* 33*  GLUCOSE 98 95  BUN 26* 24*  CREATININE 1.22* 1.07  CALCIUM 9.0 9.3   Liver Function Tests: No results found for this basename: AST, ALT, ALKPHOS, BILITOT, PROT, ALBUMIN,  in the last 72 hours No results found for this basename: LIPASE, AMYLASE,  in the last 72 hours CBC: No results found for this basename: WBC, NEUTROABS, HGB, HCT, MCV, PLT,  in the last 72 hours Cardiac Enzymes:  Recent Labs  04/28/13 1232 04/28/13 1849  TROPONINI <0.30 <0.30   BNP: No components found with this basename: POCBNP,  D-Dimer: No results found for this basename: DDIMER,  in the last 72 hours Hemoglobin A1C: No results found for this basename: HGBA1C,  in the last 72  hours Fasting Lipid Panel: No results found for this basename: CHOL, HDL, LDLCALC, TRIG, CHOLHDL, LDLDIRECT,  in the last 72 hours Thyroid Function Tests: No results found for this basename: TSH, T4TOTAL, FREET3, T3FREE, THYROIDAB,  in the last 72 hours Anemia Panel: No results found for this basename: VITAMINB12, FOLATE, FERRITIN, TIBC, IRON, RETICCTPCT,  in the last 72 hours  RADIOLOGY: Dg Chest 1 View  04/29/2013   CLINICAL DATA:  CHF, followup, history leaky heart valve/mitral regurgitation by TEE  EXAM: CHEST - 1 VIEW  COMPARISON:  04/28/2013  FINDINGS: Enlargement of cardiac silhouette with pulmonary vascular congestion.  Slight chronic accentuation of interstitial markings likely represents minimal chronic failure.  No segmental consolidation, pleural effusion or pneumothorax.  Bones demineralized.  IMPRESSION: Minimal chronic CHF.   Electronically Signed   By: Lavonia Dana M.D.   On: 04/29/2013 12:59   Dg Chest 2 View  04/28/2013   CLINICAL DATA:  Shortness of breath.  EXAM: CHEST  2 VIEW  COMPARISON:  Chest radiograph performed 04/23/2013  FINDINGS: Mild bibasilar airspace opacities may reflect atelectasis or mild pneumonia. No pleural effusion or pneumothorax is seen. The lungs are relatively well expanded.  The cardiomediastinal silhouette is mildly enlarged. No acute osseous abnormalities are identified.  IMPRESSION: Mild bibasilar airspace opacities may reflect atelectasis or mild pneumonia. Mild cardiomegaly noted.   Electronically Signed   By: Garald Balding M.D.   On: 04/28/2013 01:02   Dg Chest Port 1 View  04/23/2013   CLINICAL DATA:  Shortness of breath.  EXAM: PORTABLE CHEST - 1 VIEW  COMPARISON:  03/20/2013  FINDINGS: Cardiomegaly. Bilateral airspace opacities are noted, right greater than left. This likely reflects asymmetric edema. Cannot exclude infection. No visible effusions. No acute bony abnormality.  IMPRESSION: Cardiomegaly with vascular congestion and asymmetric  bilateral airspace disease, right greater than left. I favor this represents asymmetric edema although infection is not excluded.   Electronically Signed   By: Rolm Baptise M.D.   On: 04/23/2013 02:25      ASSESSMENT AND PLAN: 1. Acute on chronic diastolic heart failure: I agree with current oral diuretic regimen of torsemide with KCl supplementation. BP normal at 126/71. BNP 1163 on admission, previously 757 on 1/13. Given several paroxysms of rapid atrial fibrillation, I will increase metoprolol to 50 mg bid. Overall status being driven by mitral regurgitation. She is scheduled to see Dr.  Branch this Thursday, 2/26, to discuss further management. Of note, she has both normal LV systolic function (EF 06-30%) and dimensions, with 2 distinct moderate mitral regurgitant jets seen by TEE.  2. Atrial fibrillation: her CHADS-Vasc score is 7 (aortic plaque also demonstrated by TEE, along with CHF, HTN, prior CVA, age, gender) putting her at a high risk for future thromboembolic events. Given that she did not tolerate Xarelto due to nosebleeds, we discussed the option of trying Eliquis, understanding that all anticoagulants put her at an increased risk for bleeding. She agreed to a trial of Eliquis, which I started on 2/23 at 5 mg bid and I discontinued ASA. Given several paroxysms of rapid atrial fibrillation, I will increase metoprolol to 50 mg bid and transfer her to step down for a bolus of amiodarone followed by IV infusion. Her atrial fibrillation is also being driven by her mitral regurgitation with consequent moderate to severe left atrial enlargement.  3. Mitral regurgitation: She has both normal LV systolic function (EF 16-01%) and dimensions, with 2 distinct moderate mitral regurgitant jets seen by TEE. She has not had a cardiac catheterization before. Further management will be deferred to her primary cardiologist, Dr. Harl Bowie.  4. HTN: controlled on present therapy.  5. Hyperlipidemia: on pravastatin  as outpatient.       Kate Sable, M.D., F.A.C.C.

## 2013-05-02 ENCOUNTER — Encounter (HOSPITAL_COMMUNITY): Payer: Self-pay | Admitting: *Deleted

## 2013-05-02 ENCOUNTER — Inpatient Hospital Stay (HOSPITAL_COMMUNITY): Payer: Medicare HMO

## 2013-05-02 DIAGNOSIS — J9600 Acute respiratory failure, unspecified whether with hypoxia or hypercapnia: Secondary | ICD-10-CM | POA: Diagnosis present

## 2013-05-02 LAB — CBC
HEMATOCRIT: 36.8 % (ref 36.0–46.0)
HEMOGLOBIN: 12.2 g/dL (ref 12.0–15.0)
MCH: 32.7 pg (ref 26.0–34.0)
MCHC: 33.2 g/dL (ref 30.0–36.0)
MCV: 98.7 fL (ref 78.0–100.0)
Platelets: 189 10*3/uL (ref 150–400)
RBC: 3.73 MIL/uL — AB (ref 3.87–5.11)
RDW: 13.1 % (ref 11.5–15.5)
WBC: 6.4 10*3/uL (ref 4.0–10.5)

## 2013-05-02 LAB — BASIC METABOLIC PANEL
BUN: 25 mg/dL — AB (ref 6–23)
CHLORIDE: 97 meq/L (ref 96–112)
CO2: 32 meq/L (ref 19–32)
Calcium: 9.5 mg/dL (ref 8.4–10.5)
Creatinine, Ser: 1.17 mg/dL — ABNORMAL HIGH (ref 0.50–1.10)
GFR calc Af Amer: 53 mL/min — ABNORMAL LOW (ref >90)
GFR calc non Af Amer: 45 mL/min — ABNORMAL LOW (ref >90)
GLUCOSE: 156 mg/dL — AB (ref 70–99)
POTASSIUM: 4.2 meq/L (ref 3.7–5.3)
SODIUM: 139 meq/L (ref 137–147)

## 2013-05-02 MED ORDER — FUROSEMIDE 10 MG/ML IJ SOLN
INTRAMUSCULAR | Status: AC
  Filled 2013-05-02: qty 8

## 2013-05-02 MED ORDER — ALPRAZOLAM 0.25 MG PO TABS
0.2500 mg | ORAL_TABLET | Freq: Once | ORAL | Status: AC
  Administered 2013-05-02: 0.25 mg via ORAL
  Filled 2013-05-02: qty 1

## 2013-05-02 MED ORDER — AMIODARONE HCL 200 MG PO TABS
200.0000 mg | ORAL_TABLET | Freq: Every day | ORAL | Status: DC
  Administered 2013-05-02 – 2013-05-04 (×3): 200 mg via ORAL
  Filled 2013-05-02 (×4): qty 1

## 2013-05-02 MED ORDER — ALBUTEROL SULFATE (2.5 MG/3ML) 0.083% IN NEBU
2.5000 mg | INHALATION_SOLUTION | RESPIRATORY_TRACT | Status: AC
  Filled 2013-05-02: qty 3

## 2013-05-02 NOTE — Progress Notes (Signed)
Was called by staff to see patient for shortness of breath. Patient was seen sitting up in room surrounded by multiple family members.  Vitals on monitor indicate heart rate in 84Z, systolic blood pressure in the 110s, and pulse oxygenation in the high 90s on 3L of oxygen.  Patient has moderately increased respiratory effort, appears to have prolonged expiration with wheeze.  She has had 800cc urine output during this shift. She is on lasix 60mg  IV BID. Her family is concerned that she is reaccumulating fluid in her lungs and is increasingly short of breath.  Differentials at this time may be related to worsening pulmonary edema, wheezing related to possible copd, or wheezing related to recent metoprolol increase.  Will decrease metoprolol dosing to 25mg  bid. She has received a neb treatment without significant benefit. Will recheck chest xray. Anxiety may also be playing a role.  Plans to transfer to [facility] are noted.  Appreciate cardiology assistance.  Safire Gordin

## 2013-05-02 NOTE — Progress Notes (Signed)
Pt a/o.vss. Saline lock intact. No complaints of any distress at this time. Family at bedside. Report called to [place], Therapist, sports. Pt awaiting for carelink to be transferred to [place].

## 2013-05-02 NOTE — Progress Notes (Signed)
UR chart review completed.  

## 2013-05-02 NOTE — Progress Notes (Signed)
Subjective:  Acutely short of breath last night. Back on IV Lasix and IV Amiodarone.  Objective:  Vital Signs in the last 24 hours: Temp:  [97.7 F (36.5 C)-97.9 F (36.6 C)] 97.7 F (36.5 C) (02/25 0400) Pulse Rate:  [56-121] 63 (02/25 0730) Resp:  [12-26] 20 (02/25 0730) BP: (78-134)/(38-104) 99/56 mmHg (02/25 0730) SpO2:  [83 %-100 %] 100 % (02/25 0749) Weight:  [217 lb 9.5 oz (98.7 kg)] 217 lb 9.5 oz (98.7 kg) (02/25 0500)  Intake/Output from previous day: 02/24 0701 - 02/25 0700 In: 720 [P.O.:720] Out: 1150 [Urine:1150] Intake/Output from this shift:    Physical Exam: NECK: Without JVD, HJR, or bruit LUNGS:Decreased breath sounds with rales at the bases. HEART:Irregular rate and rhythm at 948/N,4/6 systolic murmur apex,no gallop, rub, bruit, thrill, or heave EXTREMITIES: Without cyanosis, clubbing, or edema   Lab Results: No results found for this basename: WBC, HGB, PLT,  in the last 72 hours  Recent Labs  04/30/13 0532 05/01/13 0505  NA 141 142  K 4.4 4.3  CL 101 100  CO2 33* 33*  GLUCOSE 98 95  BUN 26* 24*  CREATININE 1.22* 1.07   No results found for this basename: TROPONINI, CK, MB,  in the last 72 hours Hepatic Function Panel No results found for this basename: PROT, ALBUMIN, AST, ALT, ALKPHOS, BILITOT, BILIDIR, IBILI,  in the last 72 hours No results found for this basename: CHOL,  in the last 72 hours No results found for this basename: PROTIME,  in the last 72 hours  Imaging:  Cardiac Studies:  Assessment/Plan:  1. Acute on chronic diastolic heart failure:Patient went into pulmonary edema last night and is now back on IV Lasix. Given several paroxysms of rapid atrial fibrillation,  Metoprolol was increased to 50 mg bid yesterday. Overall status being driven by mitral regurgitation. She is scheduled to see Dr. Harl Bowie this Thursday, 2/26, to discuss further management-will need to reschedule. Of note, she has both normal LV systolic function (EF  27-03%) and dimensions, with 2 distinct moderate mitral regurgitant jets seen by TEE.    2. Atrial fibrillation: her CHADS-Vasc score is 7 (aortic plaque also demonstrated by TEE, along with CHF, HTN, prior CVA, age, gender) putting her at a high risk for future thromboembolic events. Given that she did not tolerate Xarelto due to nosebleeds, we discussed the option of trying Eliquis, understanding that all anticoagulants put her at an increased risk for bleeding. She agreed to a trial of Eliquis, which I started on 2/23 at 5 mg bid and I discontinued ASA. Given several paroxysms of rapid atrial fibrillation, her metoprolol was increased to 50 mg bid and is on amiodarone  IV infusion. Her atrial fibrillation is also being driven by her mitral regurgitation with consequent moderate to severe left atrial enlargement.    3. Mitral regurgitation: She has both normal LV systolic function (EF 50-09%) and dimensions, with 2 distinct moderate mitral regurgitant jets seen by TEE. She has not had a cardiac catheterization before. Further management will be deferred to her primary cardiologist, Dr. Harl Bowie.  May need to consider transfer to [facility] for cath with multiple hospitalizations and difficult to control CHF and A.fib.  4. HTN: controlled on present therapy.    5. Hyperlipidemia: on pravastatin as outpatient.         LOS: 4 days    Ermalinda Barrios 05/02/2013, 8:54 AM

## 2013-05-02 NOTE — Progress Notes (Signed)
The patient was seen and examined, and I agree with the assessment and plan as documented above, with modifications as noted below. Pt now up in chair and doing better. Denies chest pain, shortness of breath, and leg swelling. Daughter present who is very concerned and wants to know overall strategy, which we discussed at length.  1. Acute on chronic diastolic heart failure: Pt had recurrence of decompensated diastolic heart failure last night and was restarted on IV Lasix. BNP 1163 on admission, previously 757 on 1/13. Given several paroxysms of rapid atrial fibrillation, I increased metoprolol to 50 mg bid on 2/24 and started IV amiodarone. She has since converted to sinus rhythm. I will d/c IV amiodarone and start maintenance dose of 200 mg daily. She had bouts of hypotension this morning as well. Overall status being driven by mitral regurgitation. While she is scheduled to see Dr. Harl Bowie tomorrow to discuss further management, she has now had repeated hospitalizations for the aforementioned problems.  I feel the best strategy would be to transfer patient to [facility] where she can be evaluated for valve repair (right and left cardiac catheterization with coronary angiography). Of note, she has both normal LV systolic function (EF 65-03%) and dimensions, with 2 distinct moderate mitral regurgitant jets seen by TEE.   2. Atrial fibrillation: her CHADS-Vasc score is 7 (aortic plaque also demonstrated by TEE, along with CHF, HTN, prior CVA, age, gender) putting her at a high risk for future thromboembolic events. Given that she did not tolerate Xarelto due to nosebleeds, we discussed the option of trying Eliquis, understanding that all anticoagulants put her at an increased risk for bleeding. She agreed to a trial of Eliquis, which I started on 2/23 at 5 mg bid and I discontinued ASA.   As stated previously, given several paroxysms of rapid atrial fibrillation, I increased metoprolol to 50 mg bid on 2/24  and started IV amiodarone. She has since converted to sinus rhythm. I will d/c IV amiodarone and start maintenance dose of 200 mg daily.  Her atrial fibrillation is being driven by her mitral regurgitation with consequent moderate to severe left atrial enlargement.  3. Mitral regurgitation: She has both normal LV systolic function (EF 54-65%) and dimensions, with 2 distinct moderate mitral regurgitant jets seen by TEE. She has not had a cardiac catheterization before. I will arrange for transfer to [facility] for consideration for valve repair with preoperative right and left heart catheterization with coronary angiography  4. HTN: hypotensive this morning, likely due to fluid depletion from IV diuresis. Will monitor.  5. Hyperlipidemia: on pravastatin as outpatient.

## 2013-05-02 NOTE — Progress Notes (Signed)
PROGRESS NOTE    [name] [name] VWU:[id number] DOB: [birth year] DOA: 04/28/2013 PCP: [name], MD Primary Cardiologist: Dr. Carlyle Dolly  HPI/Brief narrative 73 year old female with history of chronic diastolic CHF, moderate to severe MR per TEE done last month by Dr. Carlyle Dolly, HTN, HLD, history of rheumatic fever as a child, CVA, chronic left knee pain awaiting intervention admitted on 04/28/13 with complaints of worsening dyspnea and leg swelling. She was recently discharged from [facility] after CHF exacerbation and did well with IV Lasix and her leg edema had apparently resolved. She has been compliant with home dose of Lasix 60 mg twice a day but continued to gain weight, leg swelling and worsening dyspnea. Admitted for CHF exacerbation and did well after IV Lasix and was transitioned to oral Demadex. She then had recurrence of shortness of breath and was transferred down to step down unit.  She was started back on IV lasix as well as started on amiodarone infusion for rapid atrial fibrillation.  Assessment/Plan:  1. Acute on chronic diastolic CHF: Apparently not responding to oral Lasix at home. Patient was treated with IV Lasix for a couple of days and after she showed some improvement was transitioned to oral Demadex. As per cardiology, overall status is being driven by mitral regurgitation. Metoprolol being increased to 50 mg twice a day.on 2/24 patient had recurrent worsening of shortness of breath and cxr indicated persistent pulmonary edema. She was started back on IV lasix and feels mildly improved today.  Renal function appears to be tolerating IV diuresis.  Will continue with current treatments for now. 2. A. fib with RVR/PAF: Patient was in sinus rhythm since admission until 8 AM on 2/22 when she went into rapid A. fib with ventricular rate in the 130s-140s-asymptomatic. This was probably because home metoprolol had not been continued. Despite resuming home  dose of metoprolol, she continues to be in PAF with RVR in the 110s. Cardiology following-increased metoprolol to 50 mg twice a day and transferedto step down unit for amiodarone infusion. Her atrial fibrillation is also being driven by her mitral regurgitation with consequent moderate to severe left atrial enlargement. Patient voluntarily stopped Xarelto approximately a month ago following 2 episodes of nosebleeds-did not tell her cardiologist. Cardiology started her on a Eliquis for anticoagulation and discontinued aspirin (Copay for Eliquis is $6.60 for 30 days supply and no pre auth required).  Her CHADS-Vasc score is 7 (aortic plaque also demonstrated by TEE, along with CHF, HTN, prior CVA, age, gender) putting her at a high risk for future thromboembolic events. Her heart rate is currently in the 90s, but she occasionally jumps up into the 110s.  Perhaps adding digoxin may be an option, will defer to cardiology. She is continued on amiodarone infusion for now. 3. Acute hypoxic respiratory failure: Secondary to decompensated CHF. Treat CHF. Oxygen supplements. Monitor. She may require home O2. 4. Left knee pain: Likely secondary to osteoarthritis. Pain management while inpatient and outpatient followup for intra-articular steroid. 5. Hypertension: Controlled. 6. Moderate to severe MR by TEE/history of rheumatic fever: She has both normal LV systolic function (EF 29-56%) and dimensions, with 2 distinct moderate mitral regurgitant jets seen by TEE. She has not had a cardiac catheterization before. Further management will be deferred to her primary cardiologist, Dr. Harl Bowie.  7. Chronic urinary incontinence: States that she developed this after her prior stroke and uses depends at home. 8. Hyperlipidemia: On pravastatin as outpatient.   Code Status: Full Family Communication:  None at bedside. Disposition Plan: pending hospital  course.   Consultants:  Cardiology  Procedures:  None  Antibiotics:  None   Subjective: Still feels short of breath, although mildly improved from overnight.  Objective: Filed Vitals:   05/02/13 0600 05/02/13 0700 05/02/13 0730 05/02/13 0749  BP: 104/56 115/58 99/56   Pulse: 121 59 63   Temp:      TempSrc:      Resp: 19 17 20    Height:      Weight:      SpO2: 100% 100% 99% 100%    Intake/Output Summary (Last 24 hours) at 05/02/13 0835 Last data filed at 05/02/13 0700  Gross per 24 hour  Intake    720 ml  Output    850 ml  Net   -130 ml   Filed Weights   04/30/13 0551 05/01/13 0815 05/02/13 0500  Weight: 92.6 kg (204 lb 2.3 oz) 91.173 kg (201 lb) 98.7 kg (217 lb 9.5 oz)     Exam:  General exam: Pleasant elderly female lying supine in bed. Respiratory system: crackles at bases Cardiovascular system: S1 & S2 heard, RRR. No JVD, murmurs, gallops, clicks. Trace pitting bilateral leg edema. Telemetry: Paroxysmal A. fib with RVR- currently in sinus with ventricular rate in the 90s Gastrointestinal system: Abdomen is nondistended, soft and nontender. Normal bowel sounds heard. Central nervous system: Alert and oriented. No focal neurological deficits. Extremities: Symmetric 5 x 5 power. Left knee with mild chronic swelling and no other acute findings.   Data Reviewed: Basic Metabolic Panel:  Recent Labs Lab 04/26/13 0940 04/28/13 0055 04/29/13 0629 04/30/13 0532 05/01/13 0505  NA 137 143 140 141 142  K 4.1 4.1 4.0 4.4 4.3  CL 98 100 98 101 100  CO2 28 32 32 33* 33*  GLUCOSE 125* 110* 107* 98 95  BUN 23 22 28* 26* 24*  CREATININE 0.92 0.97 1.14* 1.22* 1.07  CALCIUM 9.2 9.8 9.0 9.0 9.3   Liver Function Tests:  Recent Labs Lab 04/28/13 0055  AST 35  ALT 35  ALKPHOS 109  BILITOT 0.4  PROT 7.0  ALBUMIN 3.2*   No results found for this basename: LIPASE, AMYLASE,  in the last 168 hours No results found for this basename: AMMONIA,  in the last 168  hours CBC:  Recent Labs Lab 04/28/13 0055  WBC 4.7  NEUTROABS 2.6  HGB 12.8  HCT 38.0  MCV 95.7  PLT 212   Cardiac Enzymes:  Recent Labs Lab 04/28/13 0055 04/28/13 0642 04/28/13 1232 04/28/13 1849  TROPONINI <0.30 <0.30 <0.30 <0.30   BNP (last 3 results)  Recent Labs  03/20/13 0324 04/23/13 0155 04/28/13 0055  PROBNP 757.6* 1031.0* 1163.0*   CBG: No results found for this basename: GLUCAP,  in the last 168 hours  Recent Results (from the past 240 hour(s))  MRSA PCR SCREENING     Status: None   Collection Time    05/01/13 11:45 AM      Result Value Ref Range Status   MRSA by PCR NEGATIVE  NEGATIVE Final   Comment:            The GeneXpert MRSA Assay (FDA     approved for NASAL specimens     only), is one component of a     comprehensive MRSA colonization     surveillance program. It is not     intended to diagnose MRSA     infection nor to guide or  monitor treatment for     MRSA infections.      Studies: Dg Chest Port 1 View  05/01/2013   CLINICAL DATA:  Shortness of breath.  EXAM: PORTABLE CHEST - 1 VIEW  COMPARISON:  04/29/2013  FINDINGS: 1807 hrs. Lung volumes are low. The cardio pericardial silhouette is enlarged. Interval development of vascular congestion with interstitial pulmonary edema. Imaged bony structures of the thorax are intact. Telemetry leads overlie the chest.  IMPRESSION: Vascular congestion with interstitial pulmonary   Electronically Signed   By: Misty Stanley M.D.   On: 05/01/2013 18:21        Scheduled Meds: . apixaban  5 mg Oral BID  . darifenacin  7.5 mg Oral Daily  . furosemide  60 mg Intravenous BID  . ipratropium-albuterol  3 mL Nebulization Q4H  . metoprolol tartrate  50 mg Oral BID  . potassium chloride SA  20 mEq Oral BID  . simvastatin  10 mg Oral q1800  . sodium chloride  3 mL Intravenous Q12H   Continuous Infusions: . amiodarone (NEXTERONE PREMIX) 360 mg/200 mL dextrose 30 mg/hr (05/02/13 0600)     Principal Problem:   Diastolic CHF, acute on chronic Active Problems:   Diastolic heart failure   HTN (hypertension)   A-fib   Mitral regurgitation moderate per TEE jan 2015 followed by Dr. Harl Bowie   Shortness of breath   Juvenile rheumatic fever   Atrial fibrillation with RVR    Time spent: 25 minutes    Emri Sample, MD. [facility] Pager [phone]  If 7PM-7AM, please contact night-coverage [email or web address] Password [facility] 05/02/2013, 8:35 AM    LOS: 4 days

## 2013-05-02 NOTE — Progress Notes (Signed)
Pt noted to be in atrial fib. Colon Flattery, MD called and made aware of pt's rhythm. No new orders at this time. Will continue to monitor.

## 2013-05-03 ENCOUNTER — Ambulatory Visit: Payer: Medicare HMO | Admitting: Cardiology

## 2013-05-03 ENCOUNTER — Inpatient Hospital Stay (HOSPITAL_COMMUNITY): Payer: Medicare HMO

## 2013-05-03 LAB — HEPARIN LEVEL (UNFRACTIONATED): Heparin Unfractionated: 2.01 IU/mL — ABNORMAL HIGH (ref 0.30–0.70)

## 2013-05-03 LAB — BASIC METABOLIC PANEL
BUN: 28 mg/dL — AB (ref 6–23)
BUN: 32 mg/dL — ABNORMAL HIGH (ref 6–23)
CALCIUM: 9 mg/dL (ref 8.4–10.5)
CALCIUM: 9.1 mg/dL (ref 8.4–10.5)
CO2: 32 mEq/L (ref 19–32)
CO2: 30 mEq/L (ref 19–32)
Chloride: 99 mEq/L (ref 96–112)
Chloride: 96 mEq/L (ref 96–112)
Creatinine, Ser: 1.27 mg/dL — ABNORMAL HIGH (ref 0.50–1.10)
Creatinine, Ser: 1.38 mg/dL — ABNORMAL HIGH (ref 0.50–1.10)
GFR calc Af Amer: 48 mL/min — ABNORMAL LOW (ref >90)
GFR calc Af Amer: 43 mL/min — ABNORMAL LOW (ref >90)
GFR, EST NON AFRICAN AMERICAN: 41 mL/min — AB (ref >90)
GFR, EST NON AFRICAN AMERICAN: 37 mL/min — AB (ref >90)
GLUCOSE: 111 mg/dL — AB (ref 70–99)
Glucose, Bld: 164 mg/dL — ABNORMAL HIGH (ref 70–99)
POTASSIUM: 5.5 meq/L — AB (ref 3.7–5.3)
Potassium: 4.8 mEq/L (ref 3.7–5.3)
SODIUM: 135 meq/L — AB (ref 137–147)
Sodium: 141 mEq/L (ref 137–147)

## 2013-05-03 LAB — APTT
APTT: 86 s — AB (ref 24–37)
aPTT: 36 seconds (ref 24–37)

## 2013-05-03 LAB — CBC
HEMATOCRIT: 34.2 % — AB (ref 36.0–46.0)
Hemoglobin: 11.2 g/dL — ABNORMAL LOW (ref 12.0–15.0)
MCH: 31.9 pg (ref 26.0–34.0)
MCHC: 32.7 g/dL (ref 30.0–36.0)
MCV: 97.4 fL (ref 78.0–100.0)
Platelets: 196 10*3/uL (ref 150–400)
RBC: 3.51 MIL/uL — ABNORMAL LOW (ref 3.87–5.11)
RDW: 13.2 % (ref 11.5–15.5)
WBC: 5.7 10*3/uL (ref 4.0–10.5)

## 2013-05-03 MED ORDER — HEPARIN BOLUS VIA INFUSION
2000.0000 [IU] | Freq: Once | INTRAVENOUS | Status: AC
  Administered 2013-05-03: 2000 [IU] via INTRAVENOUS
  Filled 2013-05-03: qty 2000

## 2013-05-03 MED ORDER — SODIUM CHLORIDE 0.9 % IJ SOLN
3.0000 mL | Freq: Two times a day (BID) | INTRAMUSCULAR | Status: DC
  Administered 2013-05-03 – 2013-05-04 (×2): 3 mL via INTRAVENOUS

## 2013-05-03 MED ORDER — FUROSEMIDE 10 MG/ML IJ SOLN
40.0000 mg | Freq: Once | INTRAMUSCULAR | Status: AC
  Administered 2013-05-03: 40 mg via INTRAVENOUS

## 2013-05-03 MED ORDER — SODIUM CHLORIDE 0.9 % IJ SOLN
3.0000 mL | INTRAMUSCULAR | Status: DC | PRN
Start: 2013-05-03 — End: 2013-05-04

## 2013-05-03 MED ORDER — SODIUM CHLORIDE 0.9 % IV SOLN: 250.0000 mL | INTRAVENOUS | Status: DC | PRN

## 2013-05-03 MED ORDER — SODIUM CHLORIDE 0.9 % IV SOLN: INTRAVENOUS | Status: DC

## 2013-05-03 MED ORDER — HEPARIN (PORCINE) IN NACL 100-0.45 UNIT/ML-% IJ SOLN
1100.0000 [IU]/h | INTRAMUSCULAR | Status: DC
  Administered 2013-05-03: 850 [IU]/h via INTRAVENOUS
  Filled 2013-05-03 (×3): qty 250

## 2013-05-03 MED ORDER — MORPHINE SULFATE 2 MG/ML IJ SOLN
INTRAMUSCULAR | Status: AC
Start: 2013-05-03 — End: 2013-05-03
  Filled 2013-05-03: qty 1

## 2013-05-03 NOTE — Progress Notes (Signed)
[facility]  Patient Status: Active (receiving services up to time of hospitalization)  [facility] is providing the following services: RN Referred for services but readmitted to the hospital before start of services.  If patient discharges after hours, please call [phone].   [name] [name] 05/03/2013, 1:16 PM

## 2013-05-03 NOTE — Progress Notes (Signed)
05/03/13 2143  BiPAP/CPAP/SIPAP  BiPAP/CPAP/SIPAP Pt Type Adult  Mask Type Full face mask  Mask Size Medium  Set Rate 10 breaths/min  Respiratory Rate 23 breaths/min  IPAP 12 cmH20  EPAP 6 cmH2O  Oxygen Percent 60 %  Flow Rate 0 lpm  Minute Ventilation 9.4  Leak 23  Peak Inspiratory Pressure (PIP) 13  Tidal Volume (Vt) 343  BiPAP/CPAP/SIPAP BiPAP  Patient Home Equipment No  Auto Titrate No  BiPAP/CPAP /SiPAP Vitals  Pulse Rate 70  Resp ! 23  SpO2 96 %  Patient placed on BIPAP due to increase WOB and SOB.

## 2013-05-03 NOTE — Progress Notes (Signed)
Pt c/o SOB and chest pain rated 3 out of 10. RT paged for PRN breathing treatment. Colon Flattery, MD paged regarding low BP and chest pain. 12 lead EKG obtained. No new orders at this time. Will continue to monitor.

## 2013-05-03 NOTE — Care Management Note (Addendum)
    Page 1 of 2   05/29/2013     3:48:11 PM   CARE MANAGEMENT NOTE 05/29/2013  Patient:  [name] [name], [name] [name]   Account Number:  [id number]  Date Initiated:  05/03/2013  Documentation initiated by:  Elissa Hefty  Subjective/Objective Assessment:   adm to sdu w chf on 2-25     Action/Plan:   lives w fam, pcp dr Eddie Dibbles sasser   Anticipated DC Date:  05/30/2013   Anticipated DC Plan:  [place] referral  Clinical Social Worker      DC Planning Services  CM consult      Choice offered to / List presented to:             Status of service:  In process, will continue to follow Medicare Important Message given?   (If response is "NO", the following Medicare IM given date fields will be blank) Date Medicare IM given:   Date Additional Medicare IM given:    Discharge Disposition:  [place]  Per UR Regulation:  Reviewed for med. necessity/level of care/duration of stay  If discussed at [place] of Stay Meetings, dates discussed:   05/08/2013  05/10/2013  05/15/2013  05/17/2013  05/22/2013  05/24/2013  05/29/2013    Comments:  05/29/13 Kellar Westberg,RN,BSN [phone] PT FOR HOPEFUL DC TO [facility] ON 3/25.  CSW TO FOLLOW.  05/21/13 Edie Darley,RN,BSN [phone] PT WITH VOLUME OVERLOAD; SLOW PROGRESSION.  WILL NEED SNF AT DC.  CSW FOLLOWING TO FACILITATE DC TO SNF WHEN MEDICALLY STABLE.  05/14/2013 Mitral regurgitation: Suspect severe MR  Evaluated by CVTS with plan for MV repair and [place], scheduled Wednesday. Meds adjusted today PT Recs:  pending Dispositon pending: Crystal Hutchinson RN, BSN, MSHL, CCM 05/14/2013  3/3 1055a debbie dowell rn,bsn spoke w pt and fam. pt would like to go for short term snf for rehab. will need phy ther to document need w her [employer] ins. made sw ref. left [facility] list for [facility] in case goes home.

## 2013-05-03 NOTE — Progress Notes (Signed)
Offered to show cath/pci video to patient. Patient refused.

## 2013-05-03 NOTE — Progress Notes (Signed)
ANTICOAGULATION CONSULT NOTE - Follow Up Consult  Pharmacy Consult for heparin Indication: atrial fibrillation  Labs:  Recent Labs  05/02/13 0924 05/03/13 0218 05/03/13 1130 05/03/13 2231  HGB 12.2 11.2*  --   --   HCT 36.8 34.2*  --   --   PLT 189 196  --   --   APTT  --   --  36 86*  HEPARINUNFRC  --   --  2.01*  --   CREATININE 1.17* 1.27*  --  1.38*    Assessment/Plan:  73yo female therapeutic on heparin with initial dosing for Afib while apixiban on hold.  Will continue gtt at current rate and confirm stable with am labs.  Wynona Neat, PharmD, BCPS  05/03/2013,11:57 PM

## 2013-05-03 NOTE — Progress Notes (Addendum)
SUBJECTIVE: Pt has no complains, SOB has improved.   Intake/Output Summary (Last 24 hours) at 05/03/13 0719 Last data filed at 05/03/13 0400  Gross per 24 hour  Intake    240 ml  Output   1500 ml  Net  -1260 ml    Current Facility-Administered Medications  Medication Dose Route Frequency Provider Last Rate Last Dose  . 0.9 %  sodium chloride infusion  250 mL Intravenous PRN Phillips Grout, MD 10 mL/hr at 05/02/13 1800 250 mL at 05/02/13 1800  . acetaminophen (TYLENOL) tablet 650 mg  650 mg Oral Q6H PRN Modena Jansky, MD   650 mg at 05/02/13 1853  . albuterol (PROVENTIL) (2.5 MG/3ML) 0.083% nebulizer solution 2.5 mg  2.5 mg Nebulization Q6H PRN Gardiner Barefoot, NP   2.5 mg at 05/01/13 2202  . albuterol (PROVENTIL) (2.5 MG/3ML) 0.083% nebulizer solution 2.5 mg  2.5 mg Nebulization NOW Kathie Dike, MD      . amiodarone (PACERONE) tablet 200 mg  200 mg Oral Daily Herminio Commons, MD   200 mg at 05/02/13 1149  . apixaban (ELIQUIS) tablet 5 mg  5 mg Oral BID Herminio Commons, MD   5 mg at 05/02/13 2212  . darifenacin (ENABLEX) 24 hr tablet 7.5 mg  7.5 mg Oral Daily Modena Jansky, MD   7.5 mg at 05/02/13 0900  . furosemide (LASIX) injection 60 mg  60 mg Intravenous BID Modena Jansky, MD   60 mg at 05/02/13 2037  . HYDROcodone-acetaminophen (NORCO/VICODIN) 5-325 MG per tablet 1-2 tablet  1-2 tablet Oral Q6H PRN Modena Jansky, MD   2 tablet at 05/01/13 0808  . ipratropium-albuterol (DUONEB) 0.5-2.5 (3) MG/3ML nebulizer solution 3 mL  3 mL Nebulization Q4H Phillips Grout, MD   3 mL at 05/03/13 0438  . metoprolol (LOPRESSOR) tablet 50 mg  50 mg Oral BID Herminio Commons, MD   50 mg at 05/02/13 2212  . morphine 2 MG/ML injection 1 mg  1 mg Intravenous Q4H PRN Modena Jansky, MD   1 mg at 05/03/13 0525  . potassium chloride SA (K-DUR,KLOR-CON) CR tablet 20 mEq  20 mEq Oral BID Phillips Grout, MD   20 mEq at 05/02/13 2212  . simvastatin (ZOCOR) tablet 10 mg  10 mg Oral  q1800 Modena Jansky, MD   10 mg at 05/02/13 2038  . sodium chloride 0.9 % injection 3 mL  3 mL Intravenous Q12H Phillips Grout, MD   3 mL at 05/02/13 2200  . sodium chloride 0.9 % injection 3 mL  3 mL Intravenous PRN Phillips Grout, MD        Filed Vitals:   05/03/13 0445 05/03/13 0500 05/03/13 0515 05/03/13 0530  BP: 120/62 109/39 98/48 122/74  Pulse: 85 42 79 75  Temp:      TempSrc:      Resp: 19 19 18 22   Height:      Weight:  199 lb (90.266 kg)    SpO2: 94% 94% 96% 94%    PHYSICAL EXAM General: NAD  Neck: No JVD, no thyromegaly or thyroid nodule.  Lungs: crackles at the bases. CV: Nondisplaced PMI. Heart regular S1/S2, no S3/S4, III/VI holosystolic murmur along left sternal border and apex. Trace peripheral leg edema. No carotid bruit. Normal pedal pulses.  Abdomen: Soft, nontender, no hepatosplenomegaly, no distention.  Skin: Intact without lesions or rashes.  Neurologic: Alert and oriented x 3.  Psych: Normal affect.  Extremities: No clubbing or cyanosis.  HEENT: Normal.   TELEMETRY: Reviewed telemetry pt in normal sinus rhythm but with several paroxysms of rapid atrial fibrillation.  LABS: Basic Metabolic Panel:  Recent Labs  45/36/46 0924 05/03/13 0218  NA 139 141  K 4.2 4.8  CL 97 99  CO2 32 32  GLUCOSE 156* 111*  BUN 25* 28*  CREATININE 1.17* 1.27*  CALCIUM 9.5 9.0    Recent Labs  05/02/13 0924 05/03/13 0218  WBC 6.4 5.7  HGB 12.2 11.2*  HCT 36.8 34.2*  MCV 98.7 97.4  PLT 189 196   RADIOLOGY: Dg Chest 1 View  04/29/2013   CLINICAL DATA:    IMPRESSION: Minimal chronic CHF.   Electronically Signed   By: Ulyses Southward M.D.   On: 04/29/2013 12:59   ECHO 04/06/2013 Study Conclusions  - Left ventricle: The cavity size was normal. Wall thickness was normal. Systolic function was normal. The estimated ejection fraction was in the range of 60% to 65%. - Aortic valve: Mild to moderate regurgitation. - Mitral valve: Mildly calcified annulus. Mildly  thickened leaflets . - Left atrium: The atrium was moderately to severely dilated. No evidence of thrombus in the appendage. - Atrial septum: No defect or patent foramen ovale was identified. Echo contrast study showed no right-to-left atrial level shunt, following an increase in RA pressure induced by provocative maneuvers.    ASSESSMENT AND PLAN:  1. Acute on chronic diastolic heart failure: Pt had recurrence of decompensated diastolic heart failure on 05/01/13 was restarted on IV Lasix. BNP 1163 on admission, previously 757 on 1/13. Given several paroxysms of rapid atrial fibrillation,metoprolol was increased to 50 mg bid on 2/24 and started IV amiodarone. She has since converted to sinus rhythm. I will d/c IV amiodarone and start maintenance dose of 200 mg daily. She had bouts of hypotension yesterday, but she is asymptomatic.  Overall status being driven by mitral regurgitation. While she is scheduled to see Dr. Wyline Mood tomorrow to discuss further management, she has now had repeated hospitalizations for the aforementioned problems. She was transferred here yesterday from [facility] for further evaluation of her CHF and moderate to severe MR.  We will schedule right and left sided cardiac cath for tomorrow, given she got Eliquis the last night. We will hold Lasix this morning. Of note, she has both normal LV systolic function (EF 60-65%) and dimensions, with 2 distinct moderate mitral regurgitant jets seen by TEE.   2. Atrial fibrillation: her CHADS-Vasc score is 7 (aortic plaque also demonstrated by TEE, along with CHF, HTN, prior CVA, age, gender) putting her at a high risk for future thromboembolic events. Given that she did not tolerate Xarelto due to nosebleeds, we discussed the option of trying Eliquis, understanding that all anticoagulants put her at an increased risk for bleeding. She agreed to a trial of Eliquis, which I started on 2/23 at 5 mg bid and I discontinued ASA.  As stated  previously, given several paroxysms of rapid atrial fibrillation, I increased metoprolol to 50 mg bid on 2/24 and started IV amiodarone. She has since converted to sinus rhythm. I will d/c IV amiodarone and start maintenance dose of 200 mg daily.   Her atrial fibrillation is being driven by her mitral regurgitation with consequent moderate to severe left atrial enlargement.   3. Mitral regurgitation: She has both normal LV systolic function (EF 60-65%) and dimensions, with 2 distinct moderate mitral regurgitant jets seen by TEE. She has not had a  cardiac catheterization before. I will arrange for transfer to [facility] for consideration for valve repair with preoperative right and left heart catheterization with coronary angiography   4. HTN: hypotensive this morning, likely due to fluid depletion from IV diuresis. Will monitor.   5. Hyperlipidemia: on pravastatin as outpatient.   Ena Dawley, Lemmie Evens 05/03/2013

## 2013-05-03 NOTE — Progress Notes (Signed)
Pt c/o SOB, states she "cannot breathe and is being suffocated". Course crackles auscultated throughout lung fields. Dr. Haroldine Laws was notified and new orders given, will continue to monitor.

## 2013-05-03 NOTE — Progress Notes (Signed)
ANTICOAGULATION CONSULT NOTE - Initial Consult  Pharmacy Consult for heparin  Indication: atrial fibrillation  Allergies  Allergen Reactions  . Indomethacin Other (See Comments)    dizziness  . Norvasc [Amlodipine Besylate] Cough    Patient Measurements: Height: 5\' 2"  (157.5 cm) Weight: 199 lb (90.266 kg) IBW/kg (Calculated) : 50.1 Heparin Dosing Weight: 70kg  Vital Signs: Temp: 97.5 F (36.4 C) (02/26 0745) Temp src: Oral (02/26 0745) BP: 105/46 mmHg (02/26 1300) Pulse Rate: 77 (02/26 1300)  Labs:  Recent Labs  05/01/13 0505 05/02/13 0924 05/03/13 0218 05/03/13 1130  HGB  --  12.2 11.2*  --   HCT  --  36.8 34.2*  --   PLT  --  189 196  --   APTT  --   --   --  36  CREATININE 1.07 1.17* 1.27*  --     Estimated Creatinine Clearance: 41.8 ml/min (by C-G formula based on Cr of 1.27).   Medical History: Past Medical History  Diagnosis Date  . Congestive heart failure, unspecified   . Chronic airway obstruction, not elsewhere classified   . Precordial pain   . Swelling of limb   . Dizziness and giddiness   . Unspecified transient cerebral ischemia     Diag. 2003  . Acute, but ill-defined, cerebrovascular disease     2000 [place]  . Shortness of breath   . Obesity   . Juvenile rheumatic fever     age 41  . Unspecified essential hypertension   . Hyperlipidemia     Medications:  Prescriptions prior to admission  Medication Sig Dispense Refill  . acetaminophen (TYLENOL) 500 MG tablet Take 500 mg by mouth every 8 (eight) hours as needed for moderate pain.      Marland Kitchen aspirin EC 81 MG tablet Take 81 mg by mouth daily.      . Calcium Carbonate-Vitamin D (CALTRATE 600+D PO) Take 1 tablet by mouth daily. Soft chew      . furosemide (LASIX) 40 MG tablet Take 1.5 tablets (60 mg total) by mouth 2 (two) times daily.  90 tablet  6  . metoprolol tartrate (LOPRESSOR) 25 MG tablet Take 1 tablet (25 mg total) by mouth 2 (two) times daily.  60 tablet  6  . potassium chloride SA  (K-DUR,KLOR-CON) 20 MEQ tablet Take 1 tablet (20 mEq total) by mouth 2 (two) times daily.  60 tablet  6  . pravastatin (PRAVACHOL) 20 MG tablet Take 20 mg by mouth at bedtime.       . solifenacin (VESICARE) 5 MG tablet Take 5 mg by mouth daily.         Scheduled:  . amiodarone  200 mg Oral Daily  . darifenacin  7.5 mg Oral Daily  . furosemide  60 mg Intravenous BID  . ipratropium-albuterol  3 mL Nebulization Q4H  . metoprolol tartrate  50 mg Oral BID  . potassium chloride SA  20 mEq Oral BID  . simvastatin  10 mg Oral q1800  . sodium chloride  3 mL Intravenous Q12H    Assessment: 73 yo female here with HF on apixiban and noted for cath in am. Apixiban to be on hold and pharmacy has been asked to dose heparin. APTT= 36 and heparin level pending. Last apixiban dose was 5mg  04/22/13 at 10pm. SCr= 1/27 (CrCl~ 40) with trend up.   Goal of Therapy:  Heparin level 0.3-0.7 units/ml aPTT 66-102 seconds Monitor platelets by anticoagulation protocol: Yes   Plan:  -Heparin  2000 units IV bolus followed by 850 units/hr (~ 12 units/kg/hr) -Heparin level in 8 hours and daily wth CBC daily  Hildred Laser, Pharm D 05/03/2013 1:54 PM

## 2013-05-04 ENCOUNTER — Encounter (HOSPITAL_COMMUNITY)
Admission: EM | Disposition: A | Discharge status: Skilled Nursing Facility | Payer: Self-pay | Source: Home / Self Care | Attending: Thoracic Surgery (Cardiothoracic Vascular Surgery)

## 2013-05-04 DIAGNOSIS — I059 Rheumatic mitral valve disease, unspecified: Secondary | ICD-10-CM | POA: Diagnosis not present

## 2013-05-04 DIAGNOSIS — I509 Heart failure, unspecified: Secondary | ICD-10-CM | POA: Diagnosis not present

## 2013-05-04 HISTORY — PX: LEFT AND RIGHT HEART CATHETERIZATION WITH CORONARY ANGIOGRAM: SHX5449

## 2013-05-04 LAB — POCT I-STAT 3, ART BLOOD GAS (G3+)
ACID-BASE EXCESS: 10 mmol/L — AB (ref 0.0–2.0)
BICARBONATE: 36.6 meq/L — AB (ref 20.0–24.0)
O2 SAT: 92 %
TCO2: 38 mmol/L (ref 0–100)
pCO2 arterial: 59.1 mmHg (ref 35.0–45.0)
pH, Arterial: 7.399 (ref 7.350–7.450)
pO2, Arterial: 67 mmHg — ABNORMAL LOW (ref 80.0–100.0)

## 2013-05-04 LAB — HEPARIN LEVEL (UNFRACTIONATED): Heparin Unfractionated: >2.2 IU/mL — ABNORMAL HIGH (ref 0.30–0.70)

## 2013-05-04 LAB — CBC
HCT: 36.3 % (ref 36.0–46.0)
Hemoglobin: 11.9 g/dL — ABNORMAL LOW (ref 12.0–15.0)
MCH: 32.3 pg (ref 26.0–34.0)
MCHC: 32.8 g/dL (ref 30.0–36.0)
MCV: 98.6 fL (ref 78.0–100.0)
Platelets: 217 10*3/uL (ref 150–400)
RBC: 3.68 MIL/uL — ABNORMAL LOW (ref 3.87–5.11)
RDW: 13.2 % (ref 11.5–15.5)
WBC: 8.5 10*3/uL (ref 4.0–10.5)

## 2013-05-04 LAB — POCT I-STAT 3, VENOUS BLOOD GAS (G3P V)
Acid-Base Excess: 10 mmol/L — ABNORMAL HIGH (ref 0.0–2.0)
BICARBONATE: 38.1 meq/L — AB (ref 20.0–24.0)
O2 Saturation: 58 %
PCO2 VEN: 67.3 mmHg — AB (ref 45.0–50.0)
TCO2: 40 mmol/L (ref 0–100)
pH, Ven: 7.361 — ABNORMAL HIGH (ref 7.250–7.300)
pO2, Ven: 33 mmHg (ref 30.0–45.0)

## 2013-05-04 LAB — BASIC METABOLIC PANEL
BUN: 36 mg/dL — ABNORMAL HIGH (ref 6–23)
CO2: 31 meq/L (ref 19–32)
Calcium: 9.1 mg/dL (ref 8.4–10.5)
Chloride: 98 mEq/L (ref 96–112)
Creatinine, Ser: 1.4 mg/dL — ABNORMAL HIGH (ref 0.50–1.10)
GFR calc Af Amer: 42 mL/min — ABNORMAL LOW (ref >90)
GFR calc non Af Amer: 37 mL/min — ABNORMAL LOW (ref >90)
Glucose, Bld: 131 mg/dL — ABNORMAL HIGH (ref 70–99)
Potassium: 5.5 mEq/L — ABNORMAL HIGH (ref 3.7–5.3)
SODIUM: 137 meq/L (ref 137–147)

## 2013-05-04 LAB — APTT
APTT: 55 s — AB (ref 24–37)
aPTT: 81 seconds — ABNORMAL HIGH (ref 24–37)

## 2013-05-04 LAB — POCT ACTIVATED CLOTTING TIME
ACTIVATED CLOTTING TIME: 177 s
Activated Clotting Time: 227 seconds
Activated Clotting Time: 138 seconds

## 2013-05-04 LAB — PROTIME-INR
INR: 1.17 (ref 0.00–1.49)
Prothrombin Time: 14.7 seconds (ref 11.6–15.2)

## 2013-05-04 SURGERY — LEFT AND RIGHT HEART CATHETERIZATION WITH CORONARY ANGIOGRAM
Anesthesia: LOCAL

## 2013-05-04 MED ORDER — IPRATROPIUM-ALBUTEROL 0.5-2.5 (3) MG/3ML IN SOLN
3.0000 mL | RESPIRATORY_TRACT | Status: DC | PRN
  Filled 2013-05-04: qty 3

## 2013-05-04 MED ORDER — VERAPAMIL HCL 2.5 MG/ML IV SOLN
INTRAVENOUS | Status: AC
  Filled 2013-05-04: qty 2

## 2013-05-04 MED ORDER — SODIUM CHLORIDE 0.9 % IV SOLN
INTRAVENOUS | Status: DC
  Administered 2013-05-15 – 2013-05-16 (×2): via INTRAVENOUS

## 2013-05-04 MED ORDER — MIDAZOLAM HCL 2 MG/2ML IJ SOLN
INTRAMUSCULAR | Status: AC
  Filled 2013-05-04: qty 2

## 2013-05-04 MED ORDER — LIDOCAINE HCL (PF) 1 % IJ SOLN
INTRAMUSCULAR | Status: AC
  Filled 2013-05-04: qty 30

## 2013-05-04 MED ORDER — HEPARIN (PORCINE) IN NACL 100-0.45 UNIT/ML-% IJ SOLN
900.0000 [IU]/h | INTRAMUSCULAR | Status: DC
  Administered 2013-05-04 – 2013-05-05 (×2): 1100 [IU]/h via INTRAVENOUS
  Administered 2013-05-08: 900 [IU]/h via INTRAVENOUS
  Filled 2013-05-04 (×8): qty 250

## 2013-05-04 MED ORDER — NITROGLYCERIN 0.2 MG/ML ON CALL CATH LAB
INTRAVENOUS | Status: AC
  Filled 2013-05-04: qty 1

## 2013-05-04 MED ORDER — HEPARIN (PORCINE) IN NACL 2-0.9 UNIT/ML-% IJ SOLN
INTRAMUSCULAR | Status: AC
  Filled 2013-05-04: qty 1000

## 2013-05-04 NOTE — Progress Notes (Signed)
SUBJECTIVE: Pt has no complains, SOB has improved.   Intake/Output Summary (Last 24 hours) at 05/04/13 1025 Last data filed at 05/04/13 0600  Gross per 24 hour  Intake 494.29 ml  Output    625 ml  Net -130.71 ml    Current Facility-Administered Medications  Medication Dose Route Frequency Provider Last Rate Last Dose  . 0.9 %  sodium chloride infusion  250 mL Intravenous PRN Phillips Grout, MD 10 mL/hr at 05/02/13 1800 250 mL at 05/02/13 1800  . 0.9 %  sodium chloride infusion  250 mL Intravenous PRN Dorothy Spark, MD 10 mL/hr at 05/03/13 2000 250 mL at 05/03/13 2000  . 0.9 %  sodium chloride infusion   Intravenous Continuous Dorothy Spark, MD 10 mL/hr at 05/04/13 0541    . acetaminophen (TYLENOL) tablet 650 mg  650 mg Oral Q6H PRN Modena Jansky, MD   650 mg at 05/02/13 1853  . albuterol (PROVENTIL) (2.5 MG/3ML) 0.083% nebulizer solution 2.5 mg  2.5 mg Nebulization Q6H PRN Gardiner Barefoot, NP   2.5 mg at 05/01/13 2202  . amiodarone (PACERONE) tablet 200 mg  200 mg Oral Daily Herminio Commons, MD   200 mg at 05/03/13 1100  . darifenacin (ENABLEX) 24 hr tablet 7.5 mg  7.5 mg Oral Daily Modena Jansky, MD   7.5 mg at 05/02/13 0900  . furosemide (LASIX) injection 60 mg  60 mg Intravenous BID Dorothy Spark, MD   60 mg at 05/04/13 8527  . heparin ADULT infusion 100 units/mL (25000 units/250 mL)  1,100 Units/hr Intravenous Continuous Rogue Bussing, [facility] 11 mL/hr at 05/04/13 0541 1,100 Units/hr at 05/04/13 0541  . HYDROcodone-acetaminophen (NORCO/VICODIN) 5-325 MG per tablet 1-2 tablet  1-2 tablet Oral Q6H PRN Modena Jansky, MD   2 tablet at 05/03/13 1313  . ipratropium-albuterol (DUONEB) 0.5-2.5 (3) MG/3ML nebulizer solution 3 mL  3 mL Nebulization Q4H PRN Leonie Man, MD      . metoprolol (LOPRESSOR) tablet 50 mg  50 mg Oral BID Herminio Commons, MD   50 mg at 05/03/13 1100  . morphine 2 MG/ML injection 1 mg  1 mg Intravenous Q4H PRN Modena Jansky,  MD   1 mg at 05/03/13 2137  . potassium chloride SA (K-DUR,KLOR-CON) CR tablet 20 mEq  20 mEq Oral BID Phillips Grout, MD   20 mEq at 05/03/13 1100  . simvastatin (ZOCOR) tablet 10 mg  10 mg Oral q1800 Modena Jansky, MD   10 mg at 05/03/13 7824  . sodium chloride 0.9 % injection 3 mL  3 mL Intravenous Q12H Phillips Grout, MD   3 mL at 05/03/13 2200  . sodium chloride 0.9 % injection 3 mL  3 mL Intravenous PRN Phillips Grout, MD      . sodium chloride 0.9 % injection 3 mL  3 mL Intravenous Q12H Dorothy Spark, MD   3 mL at 05/03/13 2200  . sodium chloride 0.9 % injection 3 mL  3 mL Intravenous PRN Dorothy Spark, MD        Filed Vitals:   05/04/13 0400 05/04/13 0443 05/04/13 0600 05/04/13 0800  BP:  108/52 109/51 99/34  Pulse: 59  62 60  Temp:    98.3 F (36.8 C)  TempSrc:    Oral  Resp: 16  19 20   Height:      Weight: 200 lb 12.8 oz (91.082 kg)  SpO2: 96%  95% 97%    PHYSICAL EXAM General: NAD  Neck: No JVD, no thyromegaly or thyroid nodule.  Lungs: crackles at the bases. CV: Nondisplaced PMI. Heart regular S1/S2, no S3/S4, III/VI holosystolic murmur along left sternal border and apex. Trace peripheral leg edema. No carotid bruit. Normal pedal pulses.  Abdomen: Soft, nontender, no hepatosplenomegaly, no distention.  Skin: Intact without lesions or rashes.  Neurologic: Alert and oriented x 3.  Psych: Normal affect.  Extremities: No clubbing or cyanosis.  HEENT: Normal.   TELEMETRY: Reviewed telemetry pt in normal sinus rhythm but with several paroxysms of rapid atrial fibrillation.  LABS: Basic Metabolic Panel:  Recent Labs  05/03/13 2231 05/04/13 0523  NA 135* 137  K 5.5* 5.5*  CL 96 98  CO2 30 31  GLUCOSE 164* 131*  BUN 32* 36*  CREATININE 1.38* 1.40*  CALCIUM 9.1 9.1    Recent Labs  05/03/13 0218 05/04/13 0230  WBC 5.7 8.5  HGB 11.2* 11.9*  HCT 34.2* 36.3  MCV 97.4 98.6  PLT 196 217   RADIOLOGY: Dg Chest 1 View  04/29/2013   CLINICAL  DATA:    IMPRESSION: Minimal chronic CHF.   Electronically Signed   By: Lavonia Dana M.D.   On: 04/29/2013 12:59   ECHO 04/06/2013 Study Conclusions  - Left ventricle: The cavity size was normal. Wall thickness was normal. Systolic function was normal. The estimated ejection fraction was in the range of 60% to 65%. - Aortic valve: Mild to moderate regurgitation. - Mitral valve: Mildly calcified annulus. Mildly thickened leaflets . - Left atrium: The atrium was moderately to severely dilated. No evidence of thrombus in the appendage. - Atrial septum: No defect or patent foramen ovale was identified. Echo contrast study showed no right-to-left atrial level shunt, following an increase in RA pressure induced by provocative maneuvers.    ASSESSMENT AND PLAN:  1. Acute on chronic diastolic heart failure: Pt had recurrence of decompensated diastolic heart failure on 05/01/13 was restarted on IV Lasix. BNP 1163 on admission, previously 757 on 1/13. Given several paroxysms of rapid atrial fibrillation,metoprolol was increased to 50 mg bid on 2/24 and started IV amiodarone. She has since converted to sinus rhythm. I will d/c IV amiodarone and start maintenance dose of 200 mg daily. She had bouts of hypotension yesterday, but she is asymptomatic.   Overall status being driven by mitral regurgitation. While she is scheduled to see Dr. Harl Bowie to discuss further management, she has now had repeated hospitalizations for the aforementioned problems. She was transferred here yesterday from [facility] for further evaluation of her CHF and moderate to severe MR.   Right and left sided cardiac cath today, she has been off Eliquis since yesterday morning, on iv Heparin for bridging.  Of note, she has both normal LV systolic function (EF 123456) and dimensions, with 2 distinct moderate mitral regurgitant jets seen by TEE.   2. Atrial fibrillation: her CHADS-Vasc score is 7 (aortic plaque also demonstrated by  TEE, along with CHF, HTN, prior CVA, age, gender) putting her at a high risk for future thromboembolic events. Given that she did not tolerate Xarelto due to nosebleeds, we discussed the option of trying Eliquis, understanding that all anticoagulants put her at an increased risk for bleeding. She agreed to a trial of Eliquis, which I started on 2/23 at 5 mg bid and I discontinued ASA.  As stated previously, given several paroxysms of rapid atrial fibrillation, I increased metoprolol to 50  mg bid on 2/24 and started IV amiodarone. She has since converted to sinus rhythm. I will d/c IV amiodarone and start maintenance dose of 200 mg daily.   Her atrial fibrillation is being driven by her mitral regurgitation with consequent moderate to severe left atrial enlargement.   3. Mitral regurgitation: She has both normal LV systolic function (EF 79-39%) and dimensions, with 2 distinct moderate mitral regurgitant jets seen by TEE. She has not had a cardiac catheterization before. I will arrange for transfer to [facility] for consideration for valve repair with preoperative right and left heart catheterization with coronary angiography   4. HTN: hypotensive this morning, likely due to fluid depletion from IV diuresis. Will monitor.   5. Hyperlipidemia: on pravastatin as outpatient.   Ena Dawley, Lemmie Evens 05/04/2013

## 2013-05-04 NOTE — Progress Notes (Signed)
ANTICOAGULATION CONSULT NOTE - Follow Up Consult  Pharmacy Consult for heparin Indication: atrial fibrillation  Labs:  Recent Labs  05/02/13 0924 05/03/13 0218 05/03/13 1130 05/03/13 2231 05/04/13 0230  HGB 12.2 11.2*  --   --  11.9*  HCT 36.8 34.2*  --   --  36.3  PLT 189 196  --   --  217  APTT  --   --  36 86* 55*  LABPROT  --   --   --   --  14.7  INR  --   --   --   --  1.17  HEPARINUNFRC  --   --  2.01*  --  >2.20*  CREATININE 1.17* 1.27*  --  1.38*  --     Assessment: 72yo female now subtherapeutic on heparin after one level at goal.  Goal of Therapy:  aPTT 66-102 seconds   Plan:  Will increase heparin gtt by 2-3 units/kg/hr to 1100 units/hr and attempt to get PTT prior to cath.  Wynona Neat, PharmD, BCPS  05/04/2013,5:32 AM

## 2013-05-04 NOTE — CV Procedure (Addendum)
       PROCEDURE:  Right/Left heart catheterization with selective coronary angiography, left ventriculogram. Right upper extremity angiogram.  INDICATIONS:  Severe mitral regurgitation; preoperative evaluation  The risks, benefits, and details of the procedure were explained to the patient.  The patient verbalized understanding and wanted to proceed.  Informed written consent was obtained.  PROCEDURE TECHNIQUE:  After Xylocaine anesthesia a 46F brachial sheath was placed into a right antecubital vein over wire. A 5 French balloon-tip Swan-Ganz catheter would not advance. A pro-water was used to guide the catheter into the right atrium, and then eventually to the pulmonary artery under fluoroscopic guidance.  A 46F slender sheath was placed in the right radial artery with a single anterior needle wall stick.   There is difficulty navigating of the right radial artery. A selective right radial artery angiogram was performed through the JR 4 catheter with hand injection of contrast .  Intravenous heparin was given. Right coronary angiography was done using a Judkins R4 guide catheter.  Left coronary angiography was done using a Judkins L3.5 guide catheter.  Left ventriculography was done using a pigtail catheter.  A TR band was used for hemostasis.  The brachial sheath will be removed using manual compression.   CONTRAST:  Total of 45 cc.  COMPLICATIONS:  None.    HEMODYNAMICS:  Aortic pressure was 122/74; LV pressure was 121/22; LVEDP 31.  There was no gradient between the left ventricle and aorta.  Right atrial pressure 22/21, mean right atrial pressure 16 mm Hg; RV pressure 68/13, RVEDP 16 mm Hg; PA pressure 70/31, mean PA pressure 49 mmHg; pulmonary capillary wedge pressure 29/50 , Mean pulmonary capillary wedge pressure 34 mmHg. Of note, there were significant V waves.  PA sat 58%. Aortic sat 92%. Cardiac output 4.6 L per minute; cardiac index 2.4.  ANGIOGRAPHIC DATA:   The left main coronary  artery is widely patent.  The left anterior descending artery is a large vessel which wraps around the apex. There several medium-size diagonals which are patent. There is minimal atherosclerosis in the LAD without significant obstruction.  The left circumflex artery is a medium size vessel. There is a large ramus vessel which branches across the lateral wall. There is minimal atherosclerosis in the circumflex and ramus.  The right coronary artery is a large dominant vessel. There is mild atherosclerosis in the mid vessel. There is no significant obstruction. The PDA is a large vessel which is widely patent. The posterior lateral artery is a large branching vessel which is widely patent.  Right upper extremity angiogram showed focal spasm in the right radial artery. This was treated with 200 mcg of intra-arterial nitroglycerin and using a versacore wire.  LEFT VENTRICULOGRAM:  Left ventricular angiogram was not done.  LVEDP was 31 mmHg.  IMPRESSIONS:  1. No significant coronary artery disease. 2. LVEDP 31 mmHg.  Ejection fraction not assessed due to renal insufficiency. 3.  PA sat 58%. Aortic sat 92%. Cardiac output 4.6 L per minute; cardiac index 2.4. 4.  Severe pulmonary hypertension.  Moderately increased LVEDP.  V waves consistent with significant mitral regurgitation.  RECOMMENDATION:  Continue diuresis. Plans for valve surgery per Dr. Meda Coffee.  Of note the patient was in and out of atrial fibrillation during the case. The above hemodynamics were obtained with the patient in normal sinus rhythm.

## 2013-05-04 NOTE — Progress Notes (Signed)
ANTICOAGULATION CONSULT NOTE - Follow Up Consult  Pharmacy Consult for heparin Indication: atrial fibrillation  Labs:  Recent Labs  05/02/13 0924 05/03/13 0218  05/03/13 1130 05/03/13 2231 05/04/13 0230 05/04/13 0523 05/04/13 1030  HGB 12.2 11.2*  --   --   --  11.9*  --   --   HCT 36.8 34.2*  --   --   --  36.3  --   --   PLT 189 196  --   --   --  217  --   --   APTT  --   --   < > 36 86* 55*  --  81*  LABPROT  --   --   --   --   --  14.7  --   --   INR  --   --   --   --   --  1.17  --   --   HEPARINUNFRC  --   --   --  2.01*  --  >2.20*  --   --   CREATININE 1.17* 1.27*  --   --  1.38*  --  1.40*  --   < > = values in this interval not displayed.  Assessment: 73yo female now at goal (aPTT= 81)  on heparin at 1100 units/hr. Patient was on apixiban for afib and this is on hold for cath.   Goal of Therapy:  aPTT 66-102 seconds   Plan:  -No heparin changes needed -Will follow post cath  Hildred Laser, Pharm D 05/04/2013 12:02 PM

## 2013-05-04 NOTE — Progress Notes (Signed)
ANTICOAGULATION CONSULT NOTE - Follow Up Consult  Pharmacy Consult for heparin Indication: atrial fibrillation  Labs:  Recent Labs  05/02/13 0924 05/03/13 0218  05/03/13 1130 05/03/13 2231 05/04/13 0230 05/04/13 0523 05/04/13 1030  HGB 12.2 11.2*  --   --   --  11.9*  --   --   HCT 36.8 34.2*  --   --   --  36.3  --   --   PLT 189 196  --   --   --  217  --   --   APTT  --   --   < > 36 86* 55*  --  81*  LABPROT  --   --   --   --   --  14.7  --   --   INR  --   --   --   --   --  1.17  --   --   HEPARINUNFRC  --   --   --  2.01*  --  >2.20*  --   --   CREATININE 1.17* 1.27*  --   --  1.38*  --  1.40*  --   < > = values in this interval not displayed.  Assessment: 73yo female now at goal (aPTT= 81)  on heparin at 1100 units/hr. Patient was on apixiban for afib and this is on hold for cath. S/p cath now. Ok to resume heparin 4hr post sheath removal.   Goal of Therapy:  aPTT 66-102 seconds   Plan:   Restart heparin at 1100 units/hr Check 8hr level

## 2013-05-05 ENCOUNTER — Inpatient Hospital Stay (HOSPITAL_COMMUNITY): Payer: Medicare HMO

## 2013-05-05 DIAGNOSIS — I5033 Acute on chronic diastolic (congestive) heart failure: Secondary | ICD-10-CM | POA: Diagnosis not present

## 2013-05-05 DIAGNOSIS — I059 Rheumatic mitral valve disease, unspecified: Secondary | ICD-10-CM | POA: Diagnosis not present

## 2013-05-05 DIAGNOSIS — Z952 Presence of prosthetic heart valve: Secondary | ICD-10-CM | POA: Diagnosis not present

## 2013-05-05 DIAGNOSIS — Z9889 Other specified postprocedural states: Secondary | ICD-10-CM | POA: Diagnosis not present

## 2013-05-05 DIAGNOSIS — I509 Heart failure, unspecified: Secondary | ICD-10-CM | POA: Diagnosis not present

## 2013-05-05 DIAGNOSIS — N189 Chronic kidney disease, unspecified: Secondary | ICD-10-CM | POA: Diagnosis not present

## 2013-05-05 DIAGNOSIS — N19 Unspecified kidney failure: Secondary | ICD-10-CM | POA: Diagnosis not present

## 2013-05-05 DIAGNOSIS — I4891 Unspecified atrial fibrillation: Secondary | ICD-10-CM | POA: Diagnosis not present

## 2013-05-05 LAB — CBC
HCT: 33.8 % — ABNORMAL LOW (ref 36.0–46.0)
HEMOGLOBIN: 11.3 g/dL — AB (ref 12.0–15.0)
MCH: 32.6 pg (ref 26.0–34.0)
MCHC: 33.4 g/dL (ref 30.0–36.0)
MCV: 97.4 fL (ref 78.0–100.0)
Platelets: 186 10*3/uL (ref 150–400)
RBC: 3.47 MIL/uL — ABNORMAL LOW (ref 3.87–5.11)
RDW: 13.2 % (ref 11.5–15.5)
WBC: 6.3 10*3/uL (ref 4.0–10.5)

## 2013-05-05 LAB — BASIC METABOLIC PANEL
BUN: 35 mg/dL — ABNORMAL HIGH (ref 6–23)
CO2: 36 mEq/L — ABNORMAL HIGH (ref 19–32)
Calcium: 9.6 mg/dL (ref 8.4–10.5)
Chloride: 91 mEq/L — ABNORMAL LOW (ref 96–112)
Creatinine, Ser: 1.16 mg/dL — ABNORMAL HIGH (ref 0.50–1.10)
GFR calc Af Amer: 53 mL/min — ABNORMAL LOW (ref >90)
GFR, EST NON AFRICAN AMERICAN: 46 mL/min — AB (ref >90)
GLUCOSE: 128 mg/dL — AB (ref 70–99)
POTASSIUM: 3.9 meq/L (ref 3.7–5.3)
SODIUM: 138 meq/L (ref 137–147)

## 2013-05-05 LAB — BLOOD GAS, ARTERIAL
ACID-BASE EXCESS: 8.3 mmol/L — AB (ref 0.0–2.0)
Bicarbonate: 33.1 mEq/L — ABNORMAL HIGH (ref 20.0–24.0)
DELIVERY SYSTEMS: POSITIVE
Drawn by: 24513
Expiratory PAP: 6
FIO2: 50 %
Inspiratory PAP: 12
LHR: 10 {breaths}/min
MODE: POSITIVE
O2 SAT: 97.7 %
PATIENT TEMPERATURE: 97.3
TCO2: 34.8 mmol/L (ref 0–100)
pCO2 arterial: 51.7 mmHg — ABNORMAL HIGH (ref 35.0–45.0)
pH, Arterial: 7.419 (ref 7.350–7.450)
pO2, Arterial: 92.7 mmHg (ref 80.0–100.0)

## 2013-05-05 LAB — APTT
APTT: 87 s — AB (ref 24–37)
aPTT: 90 seconds — ABNORMAL HIGH (ref 24–37)

## 2013-05-05 LAB — HEPARIN LEVEL (UNFRACTIONATED): HEPARIN UNFRACTIONATED: 1.52 [IU]/mL — AB (ref 0.30–0.70)

## 2013-05-05 MED ORDER — FUROSEMIDE 10 MG/ML IJ SOLN
60.0000 mg | Freq: Three times a day (TID) | INTRAMUSCULAR | Status: DC
  Administered 2013-05-05: 60 mg via INTRAVENOUS

## 2013-05-05 MED ORDER — LORAZEPAM 2 MG/ML IJ SOLN: 0.5000 mg | Freq: Once | INTRAMUSCULAR | Status: AC

## 2013-05-05 MED ORDER — LORAZEPAM 2 MG/ML IJ SOLN
INTRAMUSCULAR | Status: AC
  Administered 2013-05-05: 0.5 mg
  Filled 2013-05-05: qty 1

## 2013-05-05 MED ORDER — AMIODARONE HCL IN DEXTROSE 360-4.14 MG/200ML-% IV SOLN
INTRAVENOUS | Status: AC
  Administered 2013-05-05: 60 mL
  Filled 2013-05-05: qty 200

## 2013-05-05 MED ORDER — AMIODARONE HCL IN DEXTROSE 360-4.14 MG/200ML-% IV SOLN
30.0000 mg/h | INTRAVENOUS | Status: DC
  Administered 2013-05-06: 30 mg/h via INTRAVENOUS
  Filled 2013-05-05 (×2): qty 200

## 2013-05-05 MED ORDER — AMIODARONE LOAD VIA INFUSION
150.0000 mg | Freq: Once | INTRAVENOUS | Status: AC
  Administered 2013-05-05: 150 mg via INTRAVENOUS
  Filled 2013-05-05: qty 83.34

## 2013-05-05 MED ORDER — AMIODARONE HCL IN DEXTROSE 360-4.14 MG/200ML-% IV SOLN
60.0000 mg/h | INTRAVENOUS | Status: AC
  Administered 2013-05-05: 60 mg/h via INTRAVENOUS
  Filled 2013-05-05: qty 200

## 2013-05-05 MED ORDER — LORAZEPAM 2 MG/ML IJ SOLN
0.5000 mg | Freq: Once | INTRAMUSCULAR | Status: AC
  Administered 2013-05-06: 0.5 mg via INTRAVENOUS
  Filled 2013-05-05: qty 1

## 2013-05-05 MED ORDER — POTASSIUM CHLORIDE CRYS ER 20 MEQ PO TBCR
40.0000 meq | EXTENDED_RELEASE_TABLET | Freq: Once | ORAL | Status: AC
  Administered 2013-05-05: 40 meq via ORAL
  Filled 2013-05-05: qty 2

## 2013-05-05 MED ORDER — FUROSEMIDE 10 MG/ML IJ SOLN
80.0000 mg | Freq: Three times a day (TID) | INTRAMUSCULAR | Status: DC
  Administered 2013-05-05 – 2013-05-06 (×2): 80 mg via INTRAVENOUS
  Filled 2013-05-05 (×5): qty 8

## 2013-05-05 NOTE — Progress Notes (Signed)
ANTICOAGULATION CONSULT NOTE - Follow Up Consult  Pharmacy Consult for heparin Indication: atrial fibrillation  Labs:  Recent Labs  05/03/13 0218  05/03/13 1130 05/03/13 2231 05/04/13 0230 05/04/13 0523 05/04/13 1030 05/05/13 0300  HGB 11.2*  --   --   --  11.9*  --   --  11.3*  HCT 34.2*  --   --   --  36.3  --   --  33.8*  PLT 196  --   --   --  217  --   --  186  APTT  --   < > 36 86* 55*  --  81* 90*  LABPROT  --   --   --   --  14.7  --   --   --   INR  --   --   --   --  1.17  --   --   --   HEPARINUNFRC  --   --  2.01*  --  >2.20*  --   --   --   CREATININE 1.27*  --   --  1.38*  --  1.40*  --   --   < > = values in this interval not displayed.  Assessment/Plan:  74yo female therapeutic on heparin after resumed post-cath.  Will continue gtt at current rate and confirm stable with additional level.  Wynona Neat, PharmD, BCPS  05/05/2013,4:46 AM

## 2013-05-05 NOTE — Progress Notes (Signed)
ANTICOAGULATION CONSULT NOTE - Follow Up Consult  Pharmacy Consult for heparin Indication: atrial fibrillation  Labs:  Recent Labs  05/03/13 0218  05/03/13 1130 05/03/13 2231 05/04/13 0230 05/04/13 0523 05/04/13 1030 05/05/13 0300 05/05/13 1145  HGB 11.2*  --   --   --  11.9*  --   --  11.3*  --   HCT 34.2*  --   --   --  36.3  --   --  33.8*  --   PLT 196  --   --   --  217  --   --  186  --   APTT  --   < > 36 86* 55*  --  81* 90* 87*  LABPROT  --   --   --   --  14.7  --   --   --   --   INR  --   --   --   --  1.17  --   --   --   --   HEPARINUNFRC  --   --  2.01*  --  >2.20*  --   --  1.52*  --   CREATININE 1.27*  --   --  1.38*  --  1.40*  --   --   --   < > = values in this interval not displayed.  Assessment/Plan:  73yo female therapeutic on heparin after resumed post-cath.  Will continue gtt at current rate and will recheck with AM labs.  F/u on restart of oral anticoagulation  Pierre Dellarocco M. Posey Pronto, PharmD Clinical Pharmacist- Resident Pager: [phone] Pharmacy: [phone] 05/05/2013 12:55 PM

## 2013-05-05 NOTE — Progress Notes (Signed)
Pt placed on BIPAP due to c/o SOB. O2 sats were around 90% on 5lpm [place]. BIPAP settings per RT. Pt seems a little anxious at this time, but is tolerating BIPAP well. RT will continue to monitor.

## 2013-05-05 NOTE — Progress Notes (Signed)
[name], female   DOB: 1940/10/01, 73 y.o.   MRN: [id number]   SUBJECTIVE: Patient is in atrial fibrillation with HR in 100s currently.  Denies dyspnea at rest.    Scheduled Meds: . amiodarone  150 mg Intravenous Once  . darifenacin  7.5 mg Oral Daily  . furosemide  60 mg Intravenous 3 times per day  . simvastatin  10 mg Oral q1800  . sodium chloride  3 mL Intravenous Q12H   Continuous Infusions: . sodium chloride Stopped (05/04/13 2100)  . amiodarone (NEXTERONE PREMIX) 360 mg/200 mL dextrose     Followed by  . amiodarone (NEXTERONE PREMIX) 360 mg/200 mL dextrose    . heparin 1,100 Units/hr (05/04/13 2100)   PRN Meds:.sodium chloride, acetaminophen, albuterol, HYDROcodone-acetaminophen, ipratropium-albuterol, morphine injection, sodium chloride    Filed Vitals:   05/05/13 1000 05/05/13 1153 05/05/13 1200 05/05/13 1400  BP: 115/57 101/49 92/51 98/48   Pulse:  60    Temp:  98.3 F (36.8 C)    TempSrc:  Oral    Resp: 17 23 15 19   Height:      Weight:      SpO2: 92% 98% 97% 100%    Intake/Output Summary (Last 24 hours) at 05/05/13 1458 Last data filed at 05/05/13 1200  Gross per 24 hour  Intake  822.5 ml  Output   1650 ml  Net -827.5 ml    LABS: Basic Metabolic Panel:  Recent Labs  05/03/13 2231 05/04/13 0523  NA 135* 137  K 5.5* 5.5*  CL 96 98  CO2 30 31  GLUCOSE 164* 131*  BUN 32* 36*  CREATININE 1.38* 1.40*  CALCIUM 9.1 9.1   Liver Function Tests: No results found for this basename: AST, ALT, ALKPHOS, BILITOT, PROT, ALBUMIN,  in the last 72 hours No results found for this basename: LIPASE, AMYLASE,  in the last 72 hours CBC:  Recent Labs  05/04/13 0230 05/05/13 0300  WBC 8.5 6.3  HGB 11.9* 11.3*  HCT 36.3 33.8*  MCV 98.6 97.4  PLT 217 186   Cardiac Enzymes: No results found for this basename: CKTOTAL, CKMB, CKMBINDEX, TROPONINI,  in the last 72 hours BNP: No components found with this basename: POCBNP,  D-Dimer: No  results found for this basename: DDIMER,  in the last 72 hours Hemoglobin A1C: No results found for this basename: HGBA1C,  in the last 72 hours Fasting Lipid Panel: No results found for this basename: CHOL, HDL, LDLCALC, TRIG, CHOLHDL, LDLDIRECT,  in the last 72 hours Thyroid Function Tests: No results found for this basename: TSH, T4TOTAL, FREET3, T3FREE, THYROIDAB,  in the last 72 hours Anemia Panel: No results found for this basename: VITAMINB12, FOLATE, FERRITIN, TIBC, IRON, RETICCTPCT,  in the last 72 hours   PHYSICAL EXAM General: NAD, frail Neck: JVP 12-14 cm, no thyromegaly or thyroid nodule.  Lungs: Dependent crackles CV: Nondisplaced PMI.  Heart mildly tachy, irregular S1/S2, no S3/S4, 3/6 HSM apex.  1+ ankle edema.  No carotid bruit.  Normal pedal pulses.  Abdomen: Soft, nontender, no hepatosplenomegaly, no distention.  Neurologic: Alert and oriented x 3.  Psych: Normal affect. Extremities: No clubbing or cyanosis.   TELEMETRY: Reviewed telemetry pt in atrial fibrillation, HR 100s  ASSESSMENT AND PLAN: 73 yo was transferred to [facility] for evaluation of mitral regurgitation for potential repair. was transferred to [facility] for evaluation of mitral regurgitation for potential repair.  She has acute on chronic diastolic CHF and paroxysmal atrial fibrillation.   1. Mitral regurgitation: Suspect severe MR.  2 moderate jets on TEE, additively  severe.  Large V-waves on RHC.  Will have CVTS evaluate for MV repair.  She needs tuning up prior to this given CHF.  She does look frail but says she is independent in her ADLs at home.  2. Acute on chronic diastolic CHF: Patient is volume overloaded on exam and by RHC yesterday.  She was noted to have severe pulmonary HTN on RHC, but PVR only 3.3 WU, suggesting that this was mostly pulmonary venous hypertension.  She needs more diuresis.  This may be complicated by her renal dysfunction . - Increase Lasix to 60 mg IV every 8 hours.  - Needs BMET today (not done yet).  3. Atrial fibrillation: Patient is back in atrial fibrillation.  Will start amiodarone gtt to try to keep her in NSR and control rate.  She is on heparin gtt.  Eventually want to get her back on Eliquis, but will need to decide on timing of potential MV surgery.  4. Renal dysfunction: Creatinine mildly elevated and K high yesterday.  Need to repeat today.   [name] 05/05/2013 3:14 PM

## 2013-05-05 NOTE — Progress Notes (Addendum)
On-call Cardiology Note  Subjective: I was contacted by the pt's RN, reporting that the patient appeared more short of breath.  Pt states that she feels like she is being smothered.  This began ~15 minutes ago without clear precipitant.  She denies other sx, including CP and palpitations.  Objective: Temp:  [97.4 F (36.3 C)-98.9 F (37.2 C)] 97.4 F (36.3 C) (02/28 1628) Pulse Rate:  [60-103] 72 (02/28 1628) Resp:  [13-28] 16 (02/28 1600) BP: (92-118)/(44-99) 101/44 mmHg (02/28 1600) SpO2:  [92 %-100 %] 95 % (02/28 1600) FiO2 (%):  [35 %] 35 % (02/28 0500)  Gen: Elderly woman sitting in bed at ~60 degrees. Mildly increased WOB. Neck: JVP ~10-12 cm with +HJR. Resp: Bibasilar crackles with fair air movement.  No wheezes. CV: RRR with occasional extrasystoles.  2/6 holosystolic murmur loudest at the LLSB. Ext: 1+ pretibial edema bilaterally.  Tele: NSR with occasional PVC's.  A/P: 73 y/o F with HFpEF and severe MR.  Pt had acute worsening of shortness of breath this afternoon.  She had mild respiratory distress with O2 sat in the low 90%'s with 5L [place].  Lasix had been increased earlier today to 60 mg IV TID, though she is only net negative 206 mL thus far today.  Will increase standing Lasix to 80 mg TID and initiate BiPAP.  Addendum (05/06/13, 0446): Patient has become mildly bradycardiac with soft blood pressures overnight.  She was given Ativan 0.5 mg IV x 2 due to anxiety and is now sleeping comfortably.  ABG obtained overnight shows adequate oxygen and mild CO2 retention that is likely chronic, given her normal pH.  Due to her conversion to NSR with low BP, will d/c amiodarone infusion and start oral amiodarone 400 mg BID.  Her urine output has been sluggish, even with Lasix 80 mg IV at 2200.  Will defer increasing further and/or adding metolazone until I/O's tallied later this AM, as she will likely be about net even for the last 24 hours.

## 2013-05-06 DIAGNOSIS — I509 Heart failure, unspecified: Secondary | ICD-10-CM | POA: Diagnosis not present

## 2013-05-06 DIAGNOSIS — I5033 Acute on chronic diastolic (congestive) heart failure: Secondary | ICD-10-CM | POA: Diagnosis not present

## 2013-05-06 DIAGNOSIS — I059 Rheumatic mitral valve disease, unspecified: Secondary | ICD-10-CM | POA: Diagnosis not present

## 2013-05-06 DIAGNOSIS — I4891 Unspecified atrial fibrillation: Secondary | ICD-10-CM | POA: Diagnosis not present

## 2013-05-06 LAB — BASIC METABOLIC PANEL
BUN: 38 mg/dL — AB (ref 6–23)
BUN: 38 mg/dL — ABNORMAL HIGH (ref 6–23)
BUN: 38 mg/dL — AB (ref 6–23)
CALCIUM: 9.6 mg/dL (ref 8.4–10.5)
CHLORIDE: 95 meq/L — AB (ref 96–112)
CHLORIDE: 91 meq/L — AB (ref 96–112)
CO2: 32 mEq/L (ref 19–32)
CO2: 34 mEq/L — ABNORMAL HIGH (ref 19–32)
CO2: 34 mEq/L — ABNORMAL HIGH (ref 19–32)
Calcium: 9.3 mg/dL (ref 8.4–10.5)
Calcium: 9.5 mg/dL (ref 8.4–10.5)
Chloride: 95 mEq/L — ABNORMAL LOW (ref 96–112)
Creatinine, Ser: 1.24 mg/dL — ABNORMAL HIGH (ref 0.50–1.10)
Creatinine, Ser: 1.28 mg/dL — ABNORMAL HIGH (ref 0.50–1.10)
Creatinine, Ser: 1.24 mg/dL — ABNORMAL HIGH (ref 0.50–1.10)
GFR calc Af Amer: 47 mL/min — ABNORMAL LOW (ref >90)
GFR calc Af Amer: 49 mL/min — ABNORMAL LOW (ref >90)
GFR calc non Af Amer: 41 mL/min — ABNORMAL LOW (ref >90)
GFR, EST AFRICAN AMERICAN: 49 mL/min — AB (ref >90)
GFR, EST NON AFRICAN AMERICAN: 42 mL/min — AB (ref >90)
GFR, EST NON AFRICAN AMERICAN: 42 mL/min — AB (ref >90)
GLUCOSE: 106 mg/dL — AB (ref 70–99)
Glucose, Bld: 105 mg/dL — ABNORMAL HIGH (ref 70–99)
Glucose, Bld: 113 mg/dL — ABNORMAL HIGH (ref 70–99)
POTASSIUM: 6.4 meq/L — AB (ref 3.7–5.3)
POTASSIUM: 4.1 meq/L (ref 3.7–5.3)
POTASSIUM: 3.7 meq/L (ref 3.7–5.3)
SODIUM: 140 meq/L (ref 137–147)
SODIUM: 141 meq/L (ref 137–147)
SODIUM: 137 meq/L (ref 137–147)

## 2013-05-06 LAB — APTT
APTT: 55 s — AB (ref 24–37)
aPTT: 107 seconds — ABNORMAL HIGH (ref 24–37)

## 2013-05-06 LAB — CBC
HCT: 33.9 % — ABNORMAL LOW (ref 36.0–46.0)
Hemoglobin: 11.2 g/dL — ABNORMAL LOW (ref 12.0–15.0)
MCH: 31.9 pg (ref 26.0–34.0)
MCHC: 33 g/dL (ref 30.0–36.0)
MCV: 96.6 fL (ref 78.0–100.0)
PLATELETS: 190 10*3/uL (ref 150–400)
RBC: 3.51 MIL/uL — AB (ref 3.87–5.11)
RDW: 13.3 % (ref 11.5–15.5)
WBC: 8.3 10*3/uL (ref 4.0–10.5)

## 2013-05-06 LAB — HEPARIN LEVEL (UNFRACTIONATED): HEPARIN UNFRACTIONATED: 1.44 [IU]/mL — AB (ref 0.30–0.70)

## 2013-05-06 MED ORDER — LORAZEPAM 0.5 MG PO TABS: 0.5000 mg | ORAL_TABLET | Freq: Four times a day (QID) | ORAL | Status: DC | PRN

## 2013-05-06 MED ORDER — FUROSEMIDE 10 MG/ML IJ SOLN
10.0000 mg/h | INTRAVENOUS | Status: DC
  Administered 2013-05-06 – 2013-05-07 (×2): 10 mg/h via INTRAVENOUS
  Filled 2013-05-06 (×6): qty 25

## 2013-05-06 MED ORDER — FUROSEMIDE 10 MG/ML IJ SOLN
40.0000 mg | Freq: Once | INTRAMUSCULAR | Status: AC
  Administered 2013-05-06: 40 mg via INTRAVENOUS
  Filled 2013-05-06: qty 4

## 2013-05-06 MED ORDER — AMIODARONE HCL 200 MG PO TABS
400.0000 mg | ORAL_TABLET | Freq: Two times a day (BID) | ORAL | Status: DC
  Administered 2013-05-06 – 2013-05-07 (×4): 400 mg via ORAL
  Filled 2013-05-06 (×6): qty 2

## 2013-05-06 MED ORDER — METOLAZONE 2.5 MG PO TABS
2.5000 mg | ORAL_TABLET | Freq: Once | ORAL | Status: AC
  Administered 2013-05-06: 2.5 mg via ORAL
  Filled 2013-05-06: qty 1

## 2013-05-06 NOTE — Progress Notes (Signed)
ANTICOAGULATION CONSULT NOTE - Follow Up Consult  Pharmacy Consult for Heparin Indication: atrial fibrillation  Allergies  Allergen Reactions  . Indomethacin Other (See Comments)    dizziness  . Norvasc [Amlodipine Besylate] Cough    Patient Measurements: Height: 5\' 2"  (157.5 cm) Weight: 196 lb 7 oz (89.103 kg) IBW/kg (Calculated) : 50.1   Vital Signs: Temp: 98.9 F (37.2 C) (03/01 0400) Temp src: Axillary (03/01 0400) BP: 92/40 mmHg (03/01 0806) Pulse Rate: 57 (03/01 0806)  Labs:  Recent Labs  05/04/13 0230 05/04/13 0523  05/05/13 0300 05/05/13 1145 05/05/13 1645 05/06/13 0409  HGB 11.9*  --   --  11.3*  --   --  11.2*  HCT 36.3  --   --  33.8*  --   --  33.9*  PLT 217  --   --  186  --   --  190  APTT 55*  --   < > 90* 87*  --  55*  LABPROT 14.7  --   --   --   --   --   --   INR 1.17  --   --   --   --   --   --   HEPARINUNFRC >2.20*  --   --  1.52*  --   --  1.44*  CREATININE  --  1.40*  --   --   --  1.16* 1.24*  < > = values in this interval not displayed.  Estimated Creatinine Clearance: 42.5 ml/min (by C-G formula based on Cr of 1.24).   Medications:  Scheduled:  . amiodarone  400 mg Oral BID  . darifenacin  7.5 mg Oral Daily  . furosemide  40 mg Intravenous Once  . metolazone  2.5 mg Oral Once  . simvastatin  10 mg Oral q1800  . sodium chloride  3 mL Intravenous Q12H    Assessment: 73 yo F initially started on apixiban on 2/23 for afib, however patient had to go into cath on 2/27 and was restarted post cath on heparin. Patient's aPTT levels have been therapeutic, but have now dropped this AM (unclear as to why). Nurse stated no issue with gtt or where lab was drawn from. Repeat aPTT slightly elevated, correlated better with previous levels. Patients CBC is stable.   Goal of Therapy:  APTT 60-102 seconds Monitor platelets by anticoagulation protocol: Yes   Plan:  Will adjust Heparin to 1000 u/hr Check aPTT/HL with AM labs Monitor for signs  of bleed   Angelica Chessman 05/06/2013,8:14 AM

## 2013-05-06 NOTE — Progress Notes (Signed)
[name], female   DOB: [birth year], 73 y.o.   MRN: [id number]    SUBJECTIVE: Patient developed severe dyspnea last night and was put on Bipap and given Ativan.  She is now sleeping.  She is back in NSR and amiodarone converted to po given low blood pressure.  Lasix increased to 80 mg IV every 8 hrs.  Patient did not diurese particularly well.  Creatinine stable but K high this morning (got supplemental K last night).  ECG does not show peaked T's or changes concerning for hyperkalemia.     Scheduled Meds: . amiodarone  400 mg Oral BID  . darifenacin  7.5 mg Oral Daily  . furosemide  40 mg Intravenous Once  . metolazone  2.5 mg Oral Once  . simvastatin  10 mg Oral q1800  . sodium chloride  3 mL Intravenous Q12H   Continuous Infusions: . sodium chloride Stopped (05/04/13 2100)  . furosemide (LASIX) infusion    . heparin 1,100 Units/hr (05/06/13 0600)   PRN Meds:.sodium chloride, acetaminophen, albuterol, HYDROcodone-acetaminophen, ipratropium-albuterol, LORazepam, morphine injection, sodium chloride    Filed Vitals:   05/06/13 0333 05/06/13 0400 05/06/13 0500 05/06/13 0600  BP: 103/61 93/31  100/52  Pulse: 56     Temp:  98.9 F (37.2 C)    TempSrc:  Axillary    Resp: 25 20  20   Height:      Weight:   89.103 kg (196 lb 7 oz)   SpO2: 94% 97%  98%    Intake/Output Summary (Last 24 hours) at 05/06/13 0749 Last data filed at 05/06/13 0600  Gross per 24 hour  Intake 1415.3 ml  Output   1350 ml  Net   65.3 ml    LABS: Basic Metabolic Panel:  Recent Labs  05/05/13 1645 05/06/13 0409  NA 138 140  K 3.9 6.4*  CL 91* 95*  CO2 36* 32  GLUCOSE 128* 105*  BUN 35* 38*  CREATININE 1.16* 1.24*  CALCIUM 9.6 9.3   Liver Function Tests: No results found for this basename: AST, ALT, ALKPHOS, BILITOT, PROT, ALBUMIN,  in the last 72 hours No results found for this basename: LIPASE, AMYLASE,  in the last 72 hours CBC:  Recent Labs  05/05/13 0300  05/06/13 0409  WBC 6.3 8.3  HGB 11.3* 11.2*  HCT 33.8* 33.9*  MCV 97.4 96.6  PLT 186 190   Cardiac Enzymes: No results found for this basename: CKTOTAL, CKMB, CKMBINDEX, TROPONINI,  in the last 72 hours BNP: No components found with this basename: POCBNP,  D-Dimer: No results found for this basename: DDIMER,  in the last 72 hours Hemoglobin A1C: No results found for this basename: HGBA1C,  in the last 72 hours Fasting Lipid Panel: No results found for this basename: CHOL, HDL, LDLCALC, TRIG, CHOLHDL, LDLDIRECT,  in the last 72 hours Thyroid Function Tests: No results found for this basename: TSH, T4TOTAL, FREET3, T3FREE, THYROIDAB,  in the last 72 hours Anemia Panel: No results found for this basename: VITAMINB12, FOLATE, FERRITIN, TIBC, IRON, RETICCTPCT,  in the last 72 hours   PHYSICAL EXAM General: NAD, frail Neck: JVP 12-14 cm, no thyromegaly or thyroid nodule.  Lungs: Dependent crackles CV: Nondisplaced PMI.  Heart mildly tachy, irregular S1/S2, no S3/S4, 3/6 HSM apex.  1+ ankle edema.  No carotid bruit.  Normal pedal pulses.  Abdomen: Soft, nontender, no hepatosplenomegaly, no distention.  Neurologic: sleeping currently Extremities: No clubbing or cyanosis.   TELEMETRY: Reviewed telemetry pt in  NSR, HR 50s  ASSESSMENT AND PLAN: 73 yo was transferred to [facility] for evaluation of mitral regurgitation for potential repair.  She has acute on chronic diastolic CHF and paroxysmal atrial fibrillation.   1. Mitral regurgitation: Suspect severe MR.  2 moderate jets on TEE, additively severe.  Large V-waves on RHC.  Will have CVTS evaluate for MV repair.  She needs tuning up prior to this given CHF.  She does look frail but says she is independent in her ADLs at home.  2. Acute on chronic diastolic CHF: Patient is volume overloaded on exam and by [place] Friday.  She was noted to have severe pulmonary HTN on RHC, but PVR only 3.3 WU, suggesting that this was mostly pulmonary venous  hypertension.  She needs more diuresis.  This may be complicated by her renal dysfunction (though currently stable). - I will change her over to a Lasix gtt @ 10 mg/hr and give her a dose of metolazone 2.5 x 1.   3. Atrial fibrillation: Patient is now back in NSR on amiodarone, which has been converted to po.  She is on heparin gtt.  Eventually want to get her back on Eliquis, but will need to decide on timing of potential MV surgery.  4. Renal dysfunction: Creatinine stable but K is high.  K has been mildly high on previous BMETs, now 6.5 (she did get 40 meq KCl last night).  ECG does not show peaked T's.  Will repeat BMET and will be giving both Lasix and metolazone so hopefully this will improve K level.   [name] 05/06/2013 7:49 AM

## 2013-05-07 ENCOUNTER — Inpatient Hospital Stay (HOSPITAL_COMMUNITY): Payer: Medicare HMO

## 2013-05-07 ENCOUNTER — Encounter (HOSPITAL_COMMUNITY): Payer: Self-pay | Admitting: Thoracic Surgery (Cardiothoracic Vascular Surgery)

## 2013-05-07 ENCOUNTER — Other Ambulatory Visit: Payer: Self-pay | Admitting: *Deleted

## 2013-05-07 DIAGNOSIS — I4891 Unspecified atrial fibrillation: Secondary | ICD-10-CM | POA: Diagnosis not present

## 2013-05-07 DIAGNOSIS — N189 Chronic kidney disease, unspecified: Secondary | ICD-10-CM | POA: Diagnosis present

## 2013-05-07 DIAGNOSIS — M1990 Unspecified osteoarthritis, unspecified site: Secondary | ICD-10-CM | POA: Diagnosis present

## 2013-05-07 DIAGNOSIS — I5033 Acute on chronic diastolic (congestive) heart failure: Secondary | ICD-10-CM | POA: Diagnosis not present

## 2013-05-07 DIAGNOSIS — I4821 Permanent atrial fibrillation: Secondary | ICD-10-CM | POA: Diagnosis present

## 2013-05-07 DIAGNOSIS — Z01810 Encounter for preprocedural cardiovascular examination: Secondary | ICD-10-CM

## 2013-05-07 DIAGNOSIS — I059 Rheumatic mitral valve disease, unspecified: Secondary | ICD-10-CM

## 2013-05-07 DIAGNOSIS — I509 Heart failure, unspecified: Secondary | ICD-10-CM | POA: Diagnosis not present

## 2013-05-07 DIAGNOSIS — I5032 Chronic diastolic (congestive) heart failure: Secondary | ICD-10-CM | POA: Diagnosis present

## 2013-05-07 LAB — CBC WITH DIFFERENTIAL/PLATELET
Basophils Absolute: 0.1 10*3/uL (ref 0.0–0.1)
Basophils Relative: 1 % (ref 0–1)
Eosinophils Absolute: 0.2 10*3/uL (ref 0.0–0.7)
Eosinophils Relative: 3 % (ref 0–5)
HCT: 38.3 % (ref 36.0–46.0)
Hemoglobin: 13.4 g/dL (ref 12.0–15.0)
LYMPHS PCT: 17 % (ref 12–46)
Lymphs Abs: 1.3 10*3/uL (ref 0.7–4.0)
MCH: 32.8 pg (ref 26.0–34.0)
MCHC: 35 g/dL (ref 30.0–36.0)
MCV: 93.9 fL (ref 78.0–100.0)
MONO ABS: 0.9 10*3/uL (ref 0.1–1.0)
MONOS PCT: 11 % (ref 3–12)
Neutro Abs: 5.4 10*3/uL (ref 1.7–7.7)
Neutrophils Relative %: 68 % (ref 43–77)
PLATELETS: ADEQUATE 10*3/uL (ref 150–400)
RBC: 4.08 MIL/uL (ref 3.87–5.11)
RDW: 13.1 % (ref 11.5–15.5)
Smear Review: ADEQUATE
WBC: 7.9 10*3/uL (ref 4.0–10.5)

## 2013-05-07 LAB — BASIC METABOLIC PANEL
BUN: 38 mg/dL — ABNORMAL HIGH (ref 6–23)
BUN: 42 mg/dL — ABNORMAL HIGH (ref 6–23)
CALCIUM: 10 mg/dL (ref 8.4–10.5)
CALCIUM: 9.7 mg/dL (ref 8.4–10.5)
CO2: 38 mEq/L — ABNORMAL HIGH (ref 19–32)
CO2: 37 mEq/L — ABNORMAL HIGH (ref 19–32)
CREATININE: 1.27 mg/dL — AB (ref 0.50–1.10)
Chloride: 87 mEq/L — ABNORMAL LOW (ref 96–112)
Chloride: 85 mEq/L — ABNORMAL LOW (ref 96–112)
Creatinine, Ser: 1.33 mg/dL — ABNORMAL HIGH (ref 0.50–1.10)
GFR calc non Af Amer: 41 mL/min — ABNORMAL LOW (ref >90)
GFR, EST AFRICAN AMERICAN: 48 mL/min — AB (ref >90)
GFR, EST AFRICAN AMERICAN: 45 mL/min — AB (ref >90)
GFR, EST NON AFRICAN AMERICAN: 39 mL/min — AB (ref >90)
Glucose, Bld: 108 mg/dL — ABNORMAL HIGH (ref 70–99)
Glucose, Bld: 109 mg/dL — ABNORMAL HIGH (ref 70–99)
POTASSIUM: 3.6 meq/L — AB (ref 3.7–5.3)
Potassium: 3.2 mEq/L — ABNORMAL LOW (ref 3.7–5.3)
SODIUM: 135 meq/L — AB (ref 137–147)
SODIUM: 133 meq/L — AB (ref 137–147)

## 2013-05-07 LAB — HEPARIN LEVEL (UNFRACTIONATED): HEPARIN UNFRACTIONATED: 0.76 [IU]/mL — AB (ref 0.30–0.70)

## 2013-05-07 LAB — APTT: APTT: 86 s — AB (ref 24–37)

## 2013-05-07 MED ORDER — IOHEXOL 350 MG/ML SOLN
80.0000 mL | Freq: Once | INTRAVENOUS | Status: AC | PRN
  Administered 2013-05-07: 80 mL via INTRAVENOUS

## 2013-05-07 MED ORDER — AMIODARONE IV BOLUS ONLY 150 MG/100ML
150.0000 mg | Freq: Once | INTRAVENOUS | Status: AC
  Administered 2013-05-07: 150 mg via INTRAVENOUS
  Filled 2013-05-07: qty 100

## 2013-05-07 MED ORDER — POTASSIUM CHLORIDE CRYS ER 20 MEQ PO TBCR
60.0000 meq | EXTENDED_RELEASE_TABLET | Freq: Once | ORAL | Status: AC
  Administered 2013-05-07: 60 meq via ORAL
  Filled 2013-05-07: qty 3

## 2013-05-07 MED ORDER — METOLAZONE 2.5 MG PO TABS
2.5000 mg | ORAL_TABLET | Freq: Once | ORAL | Status: AC
  Administered 2013-05-07: 2.5 mg via ORAL
  Filled 2013-05-07: qty 1

## 2013-05-07 NOTE — Progress Notes (Signed)
  Echocardiogram 2D Echocardiogram has been performed.  [name] [name] 05/07/2013, 4:54 PM

## 2013-05-07 NOTE — Progress Notes (Addendum)
Pre-op Cardiac Surgery  Carotid Findings:  Bilateral:  1-39% ICA stenosis.  Vertebral artery flow is antegrade.    Landry Mellow, RDMS, RVT 05/07/2013     Upper Extremity Right Left  Brachial Pressures 95 Triphasic  118 Triphasic   Radial Waveforms Triphasic  Triphasic   Ulnar Waveforms Triphasic  Triphasic   Palmar Arch (Allen's Test) Doppler obliterates with radial compression, normal with ulnar. Within normal limits    Ralene Cork, RVT 05/07/2013 4:06 PM

## 2013-05-07 NOTE — Progress Notes (Signed)
Assisted patient OOB to the recliner just after 9 pm and she went back into afib in the 110's. She appears to be asymptomatic at this time. PO amiodarone 400 mg given at 10 pm as scheduled. Will continue to monitor and notify MD if necessary.

## 2013-05-07 NOTE — Progress Notes (Signed)
Dr Aundra Dubin notified of atrial fib on monitor in the low 100's. Patient asymptomatic. Will administer Amio bolus as ordered.

## 2013-05-07 NOTE — Progress Notes (Signed)
ANTICOAGULATION CONSULT NOTE - Follow Up Consult  Pharmacy Consult for Heparin Indication: atrial fibrillation  Allergies  Allergen Reactions  . Indomethacin Other (See Comments)    dizziness  . Norvasc [Amlodipine Besylate] Cough    Patient Measurements: Height: 5\' 2"  (157.5 cm) Weight: 193 lb 5.5 oz (87.7 kg) IBW/kg (Calculated) : 50.1   Vital Signs: Temp: 98.3 F (36.8 C) (03/02 1145) Temp src: Oral (03/02 1145) BP: 108/61 mmHg (03/02 1145)  Labs:  Recent Labs  05/05/13 0300  05/06/13 0409 05/06/13 0751 05/06/13 1010 05/06/13 1415 05/07/13 0250 05/07/13 0855  HGB 11.3*  --  11.2*  --   --   --   --  13.4  HCT 33.8*  --  33.9*  --   --   --   --  38.3  PLT 186  --  190  --   --   --   --  PLATELET CLUMPS NOTED ON SMEAR, COUNT APPEARS ADEQUATE  APTT 90*  < > 55*  --  107*  --  86*  --   HEPARINUNFRC 1.52*  --  1.44*  --   --   --  0.76*  --   CREATININE  --   < > 1.24* 1.28*  --  1.24* 1.27*  --   < > = values in this interval not displayed.  Estimated Creatinine Clearance: 41.2 ml/min (by C-G formula based on Cr of 1.27).   Medications:  Scheduled:  . amiodarone  400 mg Oral BID  . darifenacin  7.5 mg Oral Daily  . simvastatin  10 mg Oral q1800  . sodium chloride  3 mL Intravenous Q12H   . sodium chloride Stopped (05/04/13 2100)  . furosemide (LASIX) infusion 10 mg/hr (05/07/13 0956)  . heparin 1,000 Units/hr (05/06/13 1500)    Assessment: 73 yo F initially started on apixiban on 2/23 for afib, however patient had to go into cath on 2/27 and was restarted post cath on heparin.   Heparin level remains elevated this AM due to influence of apixaban (last dose 2/26), but trending down.  PTT in goal range.  No bleeding or complications noted.  Goal of Therapy:  APTT 60-102 seconds Monitor platelets by anticoagulation protocol: Yes   Plan:  Will continue heparin at current rate of 1000 units/hr. Check aPTT/HL with AM labs Monitor for signs of  bleed  Uvaldo Rising, BCPS  Clinical Pharmacist Pager [phone]  05/07/2013 1:59 PM

## 2013-05-07 NOTE — Progress Notes (Signed)
[name], female   DOB: Jun 18, 1940, 73 y.o.   MRN: [id number]   SUBJECTIVE: Patient doing better this morning.  Breathing considerably improved, good UOP.  No longer requiring Bipap.  BP remains soft but creatinine stable. She was in atrial fibrillation briefly last night but back in NSR.   Scheduled Meds: . amiodarone  400 mg Oral BID  . darifenacin  7.5 mg Oral Daily  . metolazone  2.5 mg Oral Once  . potassium chloride  60 mEq Oral Once  . simvastatin  10 mg Oral q1800  . sodium chloride  3 mL Intravenous Q12H   Continuous Infusions: . sodium chloride Stopped (05/04/13 2100)  . furosemide (LASIX) infusion 10 mg/hr (05/06/13 0950)  . heparin 1,000 Units/hr (05/06/13 1500)   PRN Meds:.sodium chloride, acetaminophen, albuterol, HYDROcodone-acetaminophen, ipratropium-albuterol, LORazepam, morphine injection, sodium chloride    Filed Vitals:   05/07/13 0400 05/07/13 0500 05/07/13 0600 05/07/13 0730  BP: 101/61  85/51   Pulse:      Temp:    98.4 F (36.9 C)  TempSrc:    Oral  Resp: 21  18   Height:      Weight:  87.7 kg (193 lb 5.5 oz)    SpO2: 96%  97%     Intake/Output Summary (Last 24 hours) at 05/07/13 0750 Last data filed at 05/07/13 0600  Gross per 24 hour  Intake 1038.61 ml  Output   3750 ml  Net -2711.39 ml    LABS: Basic Metabolic Panel:  Recent Labs  05/06/13 1415 05/07/13 0250  NA 137 135*  K 3.7 3.2*  CL 91* 87*  CO2 34* 38*  GLUCOSE 113* 108*  BUN 38* 38*  CREATININE 1.24* 1.27*  CALCIUM 9.5 10.0   Liver Function Tests: No results found for this basename: AST, ALT, ALKPHOS, BILITOT, PROT, ALBUMIN,  in the last 72 hours No results found for this basename: LIPASE, AMYLASE,  in the last 72 hours CBC:  Recent Labs  05/05/13 0300 05/06/13 0409  WBC 6.3 8.3  HGB 11.3* 11.2*  HCT 33.8* 33.9*  MCV 97.4 96.6  PLT 186 190   Cardiac Enzymes: No results found for this basename: CKTOTAL, CKMB, CKMBINDEX, TROPONINI,  in the  last 72 hours BNP: No components found with this basename: POCBNP,  D-Dimer: No results found for this basename: DDIMER,  in the last 72 hours Hemoglobin A1C: No results found for this basename: HGBA1C,  in the last 72 hours Fasting Lipid Panel: No results found for this basename: CHOL, HDL, LDLCALC, TRIG, CHOLHDL, LDLDIRECT,  in the last 72 hours Thyroid Function Tests: No results found for this basename: TSH, T4TOTAL, FREET3, T3FREE, THYROIDAB,  in the last 72 hours Anemia Panel: No results found for this basename: VITAMINB12, FOLATE, FERRITIN, TIBC, IRON, RETICCTPCT,  in the last 72 hours   PHYSICAL EXAM General: NAD, frail Neck: JVP 10 cm, no thyromegaly or thyroid nodule.  Lungs: Dependent crackles CV: Nondisplaced PMI.  Heart mildly tachy, irregular S1/S2, no S3/S4, 2/6 HSM apex.  1+ ankle edema.  No carotid bruit.  Normal pedal pulses.  Abdomen: Soft, nontender, no hepatosplenomegaly, no distention.  Neurologic: sleeping currently Extremities: No clubbing or cyanosis.   TELEMETRY: Reviewed telemetry pt in NSR, HR 60s  ASSESSMENT AND PLAN: 73 yo was transferred to [facility] for evaluation of mitral regurgitation for potential repair.  She has acute on chronic diastolic CHF and paroxysmal atrial fibrillation.   1. Mitral regurgitation: Suspect severe MR.  2  moderate jets on TEE, additively severe.  Large V-waves on RHC.  Will have CVTS evaluate for MV repair.  She needs tuning up prior to this given CHF.  She does look frail but says she is independent in her ADLs at home.  2. Acute on chronic diastolic CHF: Patient is volume overloaded on exam and by [place] Friday.  She was noted to have severe pulmonary HTN on RHC, but PVR only 3.3 WU, suggesting that this was mostly pulmonary venous hypertension.  She needs continue diuresis.  This may be complicated by her renal dysfunction (though currently stable). - Continue Lasix gtt and will give a dose of metolazone 2.5 x 1 again.  3. Atrial  fibrillation: Patient is now back in NSR on amiodarone, which has been converted to po.  She is on heparin gtt.  Eventually want to get her back on Eliquis, but will need to decide on timing of potential MV surgery.  4. Renal dysfunction:  Creatinine stable with diuresis. No further hyperkalemia (now hypokalemic with diuresis.   [name] 05/07/2013 7:50 AM

## 2013-05-07 NOTE — Consult Note (Addendum)
[name]       [name] [name],[name] [name] [name] [id number]             [phone]          CARDIOTHORACIC SURGERY CONSULTATION REPORT  PCP is [name] Hilding, MD Referring Provider is [facility], [name] Showers, MD Primary Cardiologist is [name] Lenis, MD   Reason for consultation:  Severe mitral regurgitation  HPI:  Patient is a 73 year old obese widowed white female from [name] with remote history of rheumatic fever during childhood and long-standing history of chronic diastolic congestive heart failure who has recently experienced multiple recurrent episodes of acute exacerbation of chronic diastolic congestive heart failure.  She has been diagnosed with severe, symptomatic mitral regurgitation with recurrent paroxysmal atrial fibrillation and has now been referred for surgical consultation. The patient states that she was treated for rheumatic fever at age 67. She has a long history of a heart murmur and reported occasional history of irregular heartbeat. She has been treated for chronic diastolic congestive heart failure with bilateral lower extremity edema and exertional shortness of breath for several years. In early December 2014 she was hospitalized at [facility] in [name] with an acute exacerbation of chronic diastolic congestive heart failure. She was diagnosed with paroxysmal atrial fibrillation at that time and started on metoprolol. Since then she has been followed by Dr. Harl Bowie as an outpatient, and she has been hospitalized four additional times over the past 2 months with recurrent paroxysmal atrial fibrillation and acute exacerbations of chronic diastolic congestive heart failure.  In January she underwent transesophageal echocardiogram on 04/06/2013 demonstrating severe mitral regurgitation with mild to moderate aortic insufficiency and normal left ventricular systolic function. She was treated medically, but she was readmitted to the hospital 04/28/2013 with another acute  exacerbation of congestive heart failure.  During this hospitalization she has again experienced recurrent episodes of paroxysmal atrial fibrillation as well as resting shortness of breath and pulmonary edema.  She underwent left and right heart catheterization on 05/04/2013. This revealed normal coronary arteries with no significant coronary artery disease. There was severe pulmonary hypertension with large V waves consistent with severe mitral regurgitation. Cardiothoracic surgical consultation has been requested.  The patient is widowed and lives with one of her grandsons in [name]. She lives a sedentary lifestyle and is limited by chronic pain in her left knee related to degenerative arthritis. She has a long-standing history of chronic exertional shortness of breath and bilateral lower extremity edema. Over the last few months she has had recurrent acute exacerbations of resting shortness of breath, PND, orthopnea, tachypalpitations related to rapid atrial fibrillation, and lower extremity edema. At present she denies shortness of breath at rest. She has not had any chest pain or chest tightness.  Past Medical History  Diagnosis Date  . Chronic airway obstruction, not elsewhere classified   . Precordial pain   . Swelling of limb   . Dizziness and giddiness   . Unspecified transient cerebral ischemia     Diag. 2003  . Acute, but ill-defined, cerebrovascular disease     2000 [place]  . Shortness of breath   . Obesity   . Juvenile rheumatic fever     age 14  . Unspecified essential hypertension   . Hyperlipidemia   . Mitral regurgitation 02/16/2013  . Chronic kidney disease   . CHF (congestive heart failure) 04/23/2013  . Paroxysmal atrial fibrillation 02/16/2013    Recurrent paroxysmal atrial fibrillation, first diagnosed December 2014  .  Chronic diastolic congestive heart failure   . Arthritis     Chronic left knee pain    Past Surgical History  Procedure Laterality Date  . Total knee  arthroplasty Right   . Abdominal hysterectomy      Cervical Cancer  . Parathyroid/thyroid surgery      tumor  . Tee without cardioversion N/A 04/06/2013    Procedure: TRANSESOPHAGEAL ECHOCARDIOGRAM (TEE);  Surgeon: [name] Lenis, MD;  Location: [facility];  Service: Cardiology;  Laterality: N/A;    Family History  Problem Relation Age of Onset  . Heart failure Father   . Heart attack Brother     History   Social History  . Marital Status: Widowed    Spouse Name: N/A    Number of Children: N/A  . Years of Education: N/A   Occupational History  . Not on file.   Social History Main Topics  . Smoking status: Never Smoker   . Smokeless tobacco: Never Used  . Alcohol Use: No  . Drug Use: No  . Sexual Activity: Not on file   Other Topics Concern  . 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 Authorizing Provider  acetaminophen (TYLENOL) 500 MG tablet Take 500 mg by mouth every 8 (eight) hours as needed for moderate pain.   Yes Historical Provider, MD  aspirin EC 81 MG tablet Take 81 mg by mouth daily.   Yes Historical Provider, MD  Calcium Carbonate-Vitamin D (CALTRATE 600+D PO) Take 1 tablet by mouth daily. Soft chew   Yes Historical Provider, MD  furosemide (LASIX) 40 MG tablet Take 1.5 tablets (60 mg total) by mouth 2 (two) times daily. 03/07/13  Yes [name] Lenis, MD  metoprolol tartrate (LOPRESSOR) 25 MG tablet Take 1 tablet (25 mg total) by mouth 2 (two) times daily. 03/07/13  Yes [name] Lenis, MD  potassium chloride SA (K-DUR,KLOR-CON) 20 MEQ tablet Take 1 tablet (20 mEq total) by mouth 2 (two) times daily. 03/07/13  Yes [name] Lenis, MD  pravastatin (PRAVACHOL) 20 MG tablet Take 20 mg by mouth at bedtime.    Yes Historical Provider, MD  solifenacin (VESICARE) 5 MG tablet  Take 5 mg by mouth daily.     Yes Historical Provider, MD    Current Facility-Administered Medications  Medication Dose Route Frequency Provider Last Rate Last Dose  . 0.9 %  sodium chloride infusion  250 mL Intravenous PRN Phillips Grout, MD   250 mL at 05/02/13 1800  . 0.9 %  sodium chloride infusion   Intravenous Continuous Casandra Doffing, MD      . acetaminophen (TYLENOL) tablet 650 mg  650 mg Oral Q6H PRN Modena Jansky, MD   650 mg at 05/02/13 1853  . albuterol (PROVENTIL) (2.5 MG/3ML) 0.083% nebulizer solution 2.5 mg  2.5 mg Nebulization Q6H PRN Gardiner Barefoot, NP   2.5 mg at 05/01/13 2202  . amiodarone (PACERONE) tablet 400 mg  400 mg Oral BID Glena Norfolk End, MD   400 mg at 05/07/13 0955  . darifenacin (ENABLEX) 24 hr tablet 7.5 mg  7.5 mg Oral Daily Modena Jansky, MD   7.5 mg at 05/07/13 0955  . furosemide (LASIX) 250 mg in dextrose 5 % 250  mL infusion  10 mg/hr Intravenous Continuous Larey Dresser, MD 10 mL/hr at 05/07/13 0956 10 mg/hr at 05/07/13 0956  . heparin ADULT infusion 100 units/mL (25000 units/250 mL)  1,000 Units/hr Intravenous Continuous Angelica Chessman, RPH 10 mL/hr at 05/06/13 1500 1,000 Units/hr at 05/06/13 1500  . HYDROcodone-acetaminophen (NORCO/VICODIN) 5-325 MG per tablet 1-2 tablet  1-2 tablet Oral Q6H PRN Modena Jansky, MD   2 tablet at 05/05/13 0212  . ipratropium-albuterol (DUONEB) 0.5-2.5 (3) MG/3ML nebulizer solution 3 mL  3 mL Nebulization Q4H PRN Leonie Man, MD      . LORazepam (ATIVAN) tablet 0.5 mg  0.5 mg Oral Q6H PRN Larey Dresser, MD      . morphine 2 MG/ML injection 1 mg  1 mg Intravenous Q4H PRN Modena Jansky, MD   1 mg at 05/03/13 2137  . simvastatin (ZOCOR) tablet 10 mg  10 mg Oral q1800 Modena Jansky, MD   10 mg at 05/04/13 1754  . sodium chloride 0.9 % injection 3 mL  3 mL Intravenous Q12H Phillips Grout, MD   3 mL at 05/07/13 1000  . sodium chloride 0.9 % injection 3 mL  3 mL Intravenous PRN Phillips Grout, MD         Allergies  Allergen Reactions  . Indomethacin Other (See Comments)    dizziness  . Norvasc [Amlodipine Besylate] Cough      Review of Systems:   General:  decreased appetite, decreased energy, + weight gain, no weight loss, no fever  Cardiac:  no chest pain with exertion, no chest pain at rest, + SOB with exertion, + intermittent resting SOB, + PND, + orthopnea, + palpitations, + arrhythmia, + atrial fibrillation, + LE edema, no dizzy spells, no syncope  Respiratory:  + shortness of breath, no home oxygen, no productive cough, no dry cough, no bronchitis, no wheezing, no hemoptysis, no asthma, no pain with inspiration or cough, no sleep apnea, no CPAP at night  GI:   no difficulty swallowing, no reflux, no frequent heartburn, no hiatal hernia, no abdominal pain, no constipation, no diarrhea, no hematochezia, no hematemesis, no melena  GU:   no dysuria,  no frequency, no urinary tract infection, no hematuria, no kidney stones, + mild kidney disease  Vascular:  no pain suggestive of claudication, no pain in feet, no leg cramps, no varicose veins, no DVT, no non-healing foot ulcer  Neuro:   + remote stroke vs TIA with mild left sided weakness, no seizures, no headaches, no temporary blindness one eye,  no slurred speech, no peripheral neuropathy, no chronic pain, no instability of gait, no memory/cognitive dysfunction  Musculoskeletal: + arthritis, no joint swelling, no myalgias, mild difficulty walking, somewhat decreased mobility   Skin:   no rash, no itching, no skin infections, no pressure sores or ulcerations  Psych:   no anxiety, no depression, no nervousness, no unusual recent stress  Eyes:   + blurry vision, no floaters, no recent vision changes,  wears glasses for reading  ENT:   no hearing loss, no loose or painful teeth, no dentures, last saw dentist > 2 years ago  Hematologic:  no easy bruising, no abnormal bleeding, no clotting disorder, no frequent epistaxis  Endocrine:  no  diabetes, does not check CBG's at home     Physical Exam:   BP 100/53  Pulse 57  Temp(Src) 98.4 F (36.9 C) (Oral)  Resp 20  Ht 5\' 2"  (1.575 m)  Wt  87.7 kg (193 lb 5.5 oz)  BMI 35.35 kg/m2  SpO2 95%  General:  Mildly obese, somewhat frail-appearing  HEENT:  Unremarkable   Neck:   no JVD, no bruits, no adenopathy   Chest:   clear to auscultation, symmetrical breath sounds, no wheezes, no rhonchi   CV:   Irregular rate and rhythm, grade II/VI systolic murmur   Abdomen:  soft, non-tender, no masses   Extremities:  warm, well-perfused, pulses not palpable, + mild bilateral lower extremity edema, + chronic skin changes c/w venous insufficiency  Rectal/GU  Deferred  Neuro:   Grossly non-focal and symmetrical throughout  Skin:   Clean and dry, no rashes, no breakdown  Diagnostic Tests:  Transesophageal Echocardiography  Patient: [name], [name] MR #: [id number] Study Date: 04/06/2013 Gender: F Age: 23 Height: 157.5cm Weight: 87.1kg BSA: 1.28m^2 Pt. Status: Room:  SONOGRAPHER Lina Sar, RDCS ADMITTING Kerry Hough, M.D. ATTENDING Kerry Hough, M.D. Berna Spare, M.D. REFERRING Kerry Hough, M.D. PERFORMING [facility], [place] cc:  ------------------------------------------------------------ LV EF: 60% - 65%  ------------------------------------------------------------ Indications: Mitral regurgitation 424.0.  ------------------------------------------------------------ History: PMH: Atrial fibrillation. Congestive heart failure. Mitral valve disease. Risk factors: Hypertension.  ------------------------------------------------------------ Study Conclusions  - Left ventricle: The cavity size was normal. Wall thickness was normal. Systolic function was normal. The estimated ejection fraction was in the range of 60% to 65%. - Aortic valve: Mild to moderate regurgitation. - Mitral valve: Mildly calcified annulus. Mildly  thickened leaflets . - Left atrium: The atrium was moderately to severely dilated. No evidence of thrombus in the appendage. - Atrial septum: No defect or patent foramen ovale was identified. Echo contrast study showed no right-to-left atrial level shunt, following an increase in RA pressure induced by provocative maneuvers. Transesophageal echocardiography. 2D and color Doppler. Height: Height: 157.5cm. Height: 62in. Weight: Weight: 87.1kg. Weight: 191.6lb. Body mass index: BMI: 35.1kg/m^2. Body surface area: BSA: 1.72m^2. Blood pressure: 155/80. Patient status: Outpatient. Location: Endoscopy.  ------------------------------------------------------------  ------------------------------------------------------------ Left ventricle: The cavity size was normal. Wall thickness was normal. Systolic function was normal. The estimated ejection fraction was in the range of 60% to 65%.  ------------------------------------------------------------ Aortic valve: Probably trileaflet. Mildly thickened leaflets. Doppler: There was no stenosis. Mild to moderate regurgitation.  ------------------------------------------------------------ Aorta: Aortic root: The aortic root was normal in size. The visualized portions of the descending aorta showed mild plaque.  ------------------------------------------------------------ Mitral valve: There are 2 seperate regurgitant jets. By vena contracta both are moderate (0.5 cm and 0.6 cm). PISA is inaccurate as the regurgitant velocity and VTI are based on the combination Doppler flow of both regurgitant jets. Mildly calcified annulus. Mildly thickened leaflets . Doppler: Peak gradient: 32mm Hg (D).  ------------------------------------------------------------ Left atrium: The atrium was moderately to severely dilated. No evidence of thrombus in the appendage. The appendage was of normal size. Emptying velocity was  normal.  ------------------------------------------------------------ Atrial septum: No defect or patent foramen ovale was identified. Echo contrast study showed no right-to-left atrial level shunt, following an increase in RA pressure induced by provocative maneuvers.  ------------------------------------------------------------ Right ventricle: The cavity size was normal. Systolic function was normal.  ------------------------------------------------------------ Pulmonic valve: Not well visualized. Doppler: There was no evidence for stenosis. No significant regurgitation.  ------------------------------------------------------------ Tricuspid valve: Doppler: There was no evidence for stenosis. Mild regurgitation.  ------------------------------------------------------------ Right atrium: The atrium was normal in size.  ------------------------------------------------------------ Pericardium: There was no pericardial effusion.  ------------------------------------------------------------ Systemic veins: There is systolic blunting of the pulmonary vein flow consistent with elevated left atrial pressures.  ------------------------------------------------------------  2D measurements Normal Doppler measurements Normal Aorta Mitral valve Root diam 30 mm ------ Peak E vel 152 cm/s ------ Peak A vel 82. cm/s ------ M-mode measurements Normal 2 Aorta Deceleration 176 ms 150-23 Root diam, 32 mm 20-37 time 0 ED Peak 9 mm ------ gradient, D Hg Peak E/A 1.8 ------ ratio Max regurg 662 cm/s ------ vel  ------------------------------------------------------------ Prepared and Electronically Authenticated by  Kerry Hough, M.D. [id number]    CARDIAC CATHETERIZATION  PROCEDURE: Right/Left heart catheterization with selective coronary angiography, left ventriculogram. Right upper extremity angiogram.  INDICATIONS: Severe mitral regurgitation; preoperative  evaluation  The risks, benefits, and details of the procedure were explained to the patient. The patient verbalized understanding and wanted to proceed. Informed written consent was obtained.  PROCEDURE TECHNIQUE: After Xylocaine anesthesia a 58F brachial sheath was placed into a right antecubital vein over wire. A 5 French balloon-tip Swan-Ganz catheter would not advance. A pro-water was used to guide the catheter into the right atrium, and then eventually to the pulmonary artery under fluoroscopic guidance. A 58F slender sheath was placed in the right radial artery with a single anterior needle wall stick. There is difficulty navigating of the right radial artery. A selective right radial artery angiogram was performed through the JR 4 catheter with hand injection of contrast . Intravenous heparin was given. Right coronary angiography was done using a Judkins R4 guide catheter. Left coronary angiography was done using a Judkins L3.5 guide catheter. Left ventriculography was done using a pigtail catheter. A TR band was used for hemostasis. The brachial sheath will be removed using manual compression.  CONTRAST: Total of 45 cc.  COMPLICATIONS: None.  HEMODYNAMICS: Aortic pressure was 122/74; LV pressure was 121/22; LVEDP 31. There was no gradient between the left ventricle and aorta. Right atrial pressure 22/21, mean right atrial pressure 16 mm Hg; RV pressure 68/13, RVEDP 16 mm Hg; PA pressure 70/31, mean PA pressure 49 mmHg; pulmonary capillary wedge pressure 29/50 , Mean pulmonary capillary wedge pressure 34 mmHg. Of note, there were significant V waves. PA sat 58%. Aortic sat 92%. Cardiac output 4.6 L per minute; cardiac index 2.4.  ANGIOGRAPHIC DATA: The left main coronary artery is widely patent.  The left anterior descending artery is a large vessel which wraps around the apex. There several medium-size diagonals which are patent. There is minimal atherosclerosis in the LAD without significant  obstruction.  The left circumflex artery is a medium size vessel. There is a large ramus vessel which branches across the lateral wall. There is minimal atherosclerosis in the circumflex and ramus.  The right coronary artery is a large dominant vessel. There is mild atherosclerosis in the mid vessel. There is no significant obstruction. The PDA is a large vessel which is widely patent. The posterior lateral artery is a large branching vessel which is widely patent.  Right upper extremity angiogram showed focal spasm in the right radial artery. This was treated with 200 mcg of intra-arterial nitroglycerin and using a versacore wire.  LEFT VENTRICULOGRAM: Left ventricular angiogram was not done. LVEDP was 31 mmHg.  IMPRESSIONS:  1. No significant coronary artery disease. 2. LVEDP 31 mmHg. Ejection fraction not assessed due to renal insufficiency. 3. PA sat 58%. Aortic sat 92%. Cardiac output 4.6 L per minute; cardiac index 2.4.  4. Severe pulmonary hypertension. Moderately increased LVEDP. V waves consistent with significant mitral regurgitation.  RECOMMENDATION: Continue diuresis. Plans for valve surgery per Dr. Meda Coffee. Of note the patient was in and out of atrial  fibrillation during the case. The above hemodynamics were obtained with the patient in normal sinus rhythm.     Impression:  Patient has severe symptomatic mitral regurgitation with recurrent paroxysmal atrial fibrillation and chronic diastolic congestive heart failure. The patient has been hospitalized a total of 5 times over the past 3 months with recurrent acute exacerbations of chronic diastolic congestive heart failure, [name] Heart Association functional class IV. I have personally reviewed the patient's recent transesophageal echocardiogram and diagnostic cardiac catheterization. Transesophageal echocardiogram reveals findings consistent with likely rheumatic disease with a combination of type I and type IIIa dysfunction of the  mitral valve as well as mild to moderate aortic insufficiency.  Left ventricular size and systolic function appear normal although the patient does have some left ventricular hypertrophy with likely diastolic dysfunction. Right heart catheterization is notable for the presence of severe pulmonary hypertension as well as large V waves consistent with severe mitral regurgitation.  At the time of transesophageal echocardiogram performed in January the right ventricular size and function appeared essentially normal and there was only mild tricuspid regurgitation.  I agree that the patient needs mitral valve repair or replacement. She would likely benefit from concomitant Maze procedure. Risks associated with surgical intervention will be somewhat increased because of the patient's severe pulmonary hypertension with recent acute exacerbation of symptoms as well as her somewhat limited mobility because of severe chronic left knee pain and obesity.   Plan:  The rationale for elective mitral valve surgery has been explained, including a comparison between surgery and continued medical therapy with close follow-up.  The likelihood of successful and durable valve repair has been discussed with particular reference to the findings of their recent echocardiogram.  Based upon these findings and previous experience, I have quoted them a 53 percent likelihood of successful valve repair.  In the event that her valve cannot be successfully repaired, we discussed the possibility of replacing the mitral valve using a mechanical prosthesis with the attendant need for long-term anticoagulation versus the alternative of replacing it using a bioprosthetic tissue valve with its potential for late structural valve deterioration and failure, depending upon the patient's longevity.  The patient specifically requests that if the mitral valve must be replaced that it be done using a bioprosthetic tissue valve.  Alternative surgical  approaches have been discussed including a comparison between conventional sternotomy and minimally-invasive techniques.  The relative risks and benefits of each have been reviewed as they pertain to the patient's specific circumstances, and all of their questions have been addressed.  Specific risks potentially related to the minimally-invasive approach were discussed at length, including but not limited to risk of conversion to full or partial sternotomy, aortic dissection or other major vascular complication, unilateral acute lung injury or pulmonary edema, phrenic nerve dysfunction or paralysis, rib fracture, chronic pain, lung hernia, or lymphocele.  We will obtain CT angiogram of the aorta and iliac vessels to investigate alternative sites for cannulation to facilitate a possible minimally invasive approach for surgery. Finally, we discussed the timing of surgery. The patient certainly needs further medical tuneup, nutritional support and physical strengthening before we would proceed with surgery.  However, under the circumstances it might be reasonable to proceed with surgery during this hospitalization, potentially sometime next week.  All questions have been addressed.    I spent in excess of 110 minutes during the conduct of this hospital consultation and >50% of this time involved direct face-to-face encounter for counseling and/or coordination of the patient's  care.  Valentina Gu. Roxy Manns, MD 05/07/2013 10:14 AM

## 2013-05-07 NOTE — Progress Notes (Signed)
CARDIAC REHAB PHASE I   PRE:  Rate/Rhythm: 61 Sr frequent PAC's  BP:  Supine: 102/56  Sitting:  Standing:    SaO2: 95 4L  MODE:  Ambulation: 112 ft   POST:  Rate/Rhythm: 92 SR frequent PAC's  BP:  Supine:   Sitting: 118/50  Standing:    SaO2: 96 4L 1055-1600 Assisted X 2 used walker, gait belt and O2 4L to ambulate. Pt has difficultly standing due to needing knee replacement. Pt's daughter states that she has needed help for years to get up to standing position. Once up pt was able to walk 112 feet. She does tire easily and leans forward with walking especially as she tires. Pt needs reminders to stay inside of walker. Pt back to bed after walk. She was incontinent of small amount of liquid stool, pt cleaned and linens changed.  Pt in bed with call light in reach and daughter present. Would recommend a Physical Therapy consult for strengthening and to assess discharge needs. Rodney Langton RN 05/07/2013 3:54 PM

## 2013-05-08 ENCOUNTER — Encounter (HOSPITAL_COMMUNITY): Payer: Medicare HMO

## 2013-05-08 DIAGNOSIS — I4891 Unspecified atrial fibrillation: Secondary | ICD-10-CM | POA: Diagnosis not present

## 2013-05-08 DIAGNOSIS — I509 Heart failure, unspecified: Secondary | ICD-10-CM | POA: Diagnosis not present

## 2013-05-08 DIAGNOSIS — I5033 Acute on chronic diastolic (congestive) heart failure: Secondary | ICD-10-CM | POA: Diagnosis not present

## 2013-05-08 DIAGNOSIS — I059 Rheumatic mitral valve disease, unspecified: Secondary | ICD-10-CM | POA: Diagnosis not present

## 2013-05-08 LAB — HEPARIN LEVEL (UNFRACTIONATED)
Heparin Unfractionated: 1.72 IU/mL — ABNORMAL HIGH (ref 0.30–0.70)
Heparin Unfractionated: 1.14 IU/mL — ABNORMAL HIGH (ref 0.30–0.70)

## 2013-05-08 LAB — CBC
HEMATOCRIT: 35.9 % — AB (ref 36.0–46.0)
Hemoglobin: 12.2 g/dL (ref 12.0–15.0)
MCH: 32.3 pg (ref 26.0–34.0)
MCHC: 34 g/dL (ref 30.0–36.0)
MCV: 95 fL (ref 78.0–100.0)
Platelets: 229 10*3/uL (ref 150–400)
RBC: 3.78 MIL/uL — AB (ref 3.87–5.11)
RDW: 13.1 % (ref 11.5–15.5)
WBC: 6.3 10*3/uL (ref 4.0–10.5)

## 2013-05-08 LAB — HEMOGLOBIN A1C
HEMOGLOBIN A1C: 6 % — AB (ref <5.7)
MEAN PLASMA GLUCOSE: 126 mg/dL — AB (ref <117)

## 2013-05-08 LAB — APTT
aPTT: 103 seconds — ABNORMAL HIGH (ref 24–37)
aPTT: 65 seconds — ABNORMAL HIGH (ref 24–37)
aPTT: 70 seconds — ABNORMAL HIGH (ref 24–37)

## 2013-05-08 LAB — COMPREHENSIVE METABOLIC PANEL
ALBUMIN: 3 g/dL — AB (ref 3.5–5.2)
ALK PHOS: 97 U/L (ref 39–117)
ALT: 10 U/L (ref 0–35)
AST: 15 U/L (ref 0–37)
BILIRUBIN TOTAL: 0.4 mg/dL (ref 0.3–1.2)
BUN: 47 mg/dL — ABNORMAL HIGH (ref 6–23)
CHLORIDE: 87 meq/L — AB (ref 96–112)
CO2: 37 mEq/L — ABNORMAL HIGH (ref 19–32)
Calcium: 10.1 mg/dL (ref 8.4–10.5)
Creatinine, Ser: 1.54 mg/dL — ABNORMAL HIGH (ref 0.50–1.10)
GFR calc Af Amer: 38 mL/min — ABNORMAL LOW (ref >90)
GFR calc non Af Amer: 33 mL/min — ABNORMAL LOW (ref >90)
Glucose, Bld: 111 mg/dL — ABNORMAL HIGH (ref 70–99)
Potassium: 3.6 mEq/L — ABNORMAL LOW (ref 3.7–5.3)
SODIUM: 137 meq/L (ref 137–147)
TOTAL PROTEIN: 6.9 g/dL (ref 6.0–8.3)

## 2013-05-08 LAB — PRO B NATRIURETIC PEPTIDE: PRO B NATRI PEPTIDE: 460.4 pg/mL — AB (ref 0–125)

## 2013-05-08 LAB — PREALBUMIN: Prealbumin: 16.4 mg/dL — ABNORMAL LOW (ref 17.0–34.0)

## 2013-05-08 MED ORDER — TORSEMIDE 20 MG PO TABS
40.0000 mg | ORAL_TABLET | Freq: Two times a day (BID) | ORAL | Status: DC
  Administered 2013-05-08 – 2013-05-09 (×3): 40 mg via ORAL
  Filled 2013-05-08 (×7): qty 2

## 2013-05-08 MED ORDER — HEPARIN (PORCINE) IN NACL 100-0.45 UNIT/ML-% IJ SOLN
850.0000 [IU]/h | INTRAMUSCULAR | Status: AC
  Administered 2013-05-08 – 2013-05-13 (×6): 950 [IU]/h via INTRAVENOUS
  Administered 2013-05-14 – 2013-05-15 (×2): 850 [IU]/h via INTRAVENOUS
  Filled 2013-05-08 (×11): qty 250

## 2013-05-08 MED ORDER — POTASSIUM CHLORIDE CRYS ER 20 MEQ PO TBCR
40.0000 meq | EXTENDED_RELEASE_TABLET | Freq: Once | ORAL | Status: AC
  Administered 2013-05-08: 40 meq via ORAL
  Filled 2013-05-08: qty 2

## 2013-05-08 MED ORDER — AMIODARONE HCL IN DEXTROSE 360-4.14 MG/200ML-% IV SOLN
30.0000 mg/h | INTRAVENOUS | Status: DC
  Administered 2013-05-08 – 2013-05-11 (×5): 30 mg/h via INTRAVENOUS
  Filled 2013-05-08 (×11): qty 200

## 2013-05-08 NOTE — Progress Notes (Signed)
Patient is currently active with [name] for chronic disease management services.  Patient has been assigned a Geologist, engineering.  We were unable to engage after her last admission because she returned ED in less than 24 hours.  Made family aware of Liaison role and [facility] services.  Family would like some assistance with getting a ramp build at her home.  Patient will receive a post discharge transition of care call and will be evaluated for monthly home visits for assessments and disease process education.  Made daughter aware that she would need to assist with alerting the [facility] office of the patient's return to home from SNF.  Made Inpatient Case Manager aware that [facility] following. Of note, [facility] Care Management services does not replace or interfere with any services that are arranged by inpatient case management or social work.  For additional questions or referrals please contact Corliss Blacker BSN RN [facility] Liaison at [phone].

## 2013-05-08 NOTE — Progress Notes (Signed)
ANTICOAGULATION CONSULT NOTE - Follow Up Consult  Pharmacy Consult for Heparin Indication: atrial fibrillation  Patient Measurements: Height: 5\' 2"  (157.5 cm) Weight: 188 lb 4.4 oz (85.4 kg) IBW/kg (Calculated) : 50.1  Labs:  Recent Labs  05/06/13 0409  05/07/13 0250 05/07/13 0855 05/07/13 1500 05/08/13 0230 05/08/13 1340 05/08/13 1942  HGB 11.2*  --   --  13.4  --  12.2  --   --   HCT 33.9*  --   --  38.3  --  35.9*  --   --   PLT 190  --   --  PLATELET CLUMPS NOTED ON SMEAR, COUNT APPEARS ADEQUATE  --  229  --   --   APTT 55*  < > 86*  --   --  103* 65* 70*  HEPARINUNFRC 1.44*  --  0.76*  --   --  1.72* 1.14*  --   CREATININE 1.24*  < > 1.27*  --  1.33* 1.54*  --   --   < > = values in this interval not displayed.  Estimated Creatinine Clearance: 33.5 ml/min (by C-G formula based on Cr of 1.54).  Assessment: 73 yo F initially started on apixiban on 2/23 for afib, however patient had to go into cath on 2/27 and was restarted post- cath on heparin.   Heparin level elevated this am, due to influence of apixaban (last dose 2/26), but trending down.   aPTT is now in goal range, after heparin rate increase from 900 to 950 units/hr earlier today.  Goal of Therapy:  aPTT 66-102 seconds Monitor platelets by anticoagulation protocol: Yes   Plan:   Continue heparin at 950 units/hr.  Next heparin level and aPTT in am.  Arty Baumgartner, [place] Pager: [phone]  05/08/2013 9:32 PM

## 2013-05-08 NOTE — Progress Notes (Signed)
[address]       [place] [address]             [phone]     CARDIOTHORACIC SURGERY PROGRESS NOTE  4 Days Post-Op  S/P Procedure(s) (LRB): LEFT AND RIGHT HEART CATHETERIZATION WITH CORONARY ANGIOGRAM (N/A)  Subjective: Slow progress.  Still w/ significant dyspnea and in and out of Afib. Ambulating short distances w/ assistance.  Objective: Vital signs in last 24 hours: Temp:  [97.5 F (36.4 C)-98.7 F (37.1 C)] 98.7 F (37.1 C) (03/03 1141) Pulse Rate:  [115] 115 (03/03 0911) Cardiac Rhythm:  [-] Atrial fibrillation (03/03 0900) Resp:  [14-19] 19 (03/03 0800) BP: (99-125)/(43-70) 112/70 mmHg (03/03 1141) SpO2:  [93 %-98 %] 96 % (03/03 1141) Weight:  [85.4 kg (188 lb 4.4 oz)] 85.4 kg (188 lb 4.4 oz) (03/03 0432)  Physical Exam:  Rhythm:   Sinus  Breath sounds: Diminished at bases  Heart sounds:  RRR w/ systolic murmur  Incisions:  n/a  Abdomen:  Soft, non-distended, non-tender  Extremities:  Warm, well-perfused    Intake/Output from previous day: 03/02 0701 - 03/03 0700 In: 1520 [P.O.:960; I.V.:560] Out: 1853 [Urine:1850; Stool:3] Intake/Output this shift: Total I/O In: 690.4 [P.O.:600; I.V.:90.4] Out: -   Lab Results:  Recent Labs  05/07/13 0855 05/08/13 0230  WBC 7.9 6.3  HGB 13.4 12.2  HCT 38.3 35.9*  PLT PLATELET CLUMPS NOTED ON SMEAR, COUNT APPEARS ADEQUATE 229   BMET:  Recent Labs  05/07/13 1500 05/08/13 0230  NA 133* 137  K 3.6* 3.6*  CL 85* 87*  CO2 37* 37*  GLUCOSE 109* 111*  BUN 42* 47*  CREATININE 1.33* 1.54*  CALCIUM 9.7 10.1    CBG (last 3)  No results found for this basename: GLUCAP,  in the last 72 hours PT/INR:  No results found for this basename: LABPROT, INR,  in the last 72 hours  CT ANGIOGRAPHY CHEST, ABDOMEN AND PELVIS  TECHNIQUE:  Multidetector CT imaging through the chest, abdomen and pelvis was  performed using the standard protocol during bolus administration of  intravenous contrast.  Multiplanar reconstructed images and MIPs were  obtained and reviewed to evaluate the vascular anatomy.  CONTRAST: 79mL OMNIPAQUE IOHEXOL 350 MG/ML SOLN  COMPARISON: None.  FINDINGS:  CTA CHEST FINDINGS  The non contrast scan shows no hyperdense crescent or mediastinal  hematoma. Patchy coronary and aortic calcifications are noted. Small  bilateral pleural effusions.  On CTA, Satisfactory opacification of pulmonary arteries noted, and  there is no evidence of pulmonary emboli. Adequate contrast  opacification of the thoracic aorta with no evidence of dissection,  aneurysm, or stenosis. There is classic 3-vessel brachiocephalic  arch anatomy without proximal stenosis.  Subcentimeter prevascular, AP window, and right paratracheal lymph  nodes. No hilar adenopathy. Previous left hemithyroidectomy. There  are ground-glass opacities in both upper lobes with some peripheral  areas of scar or atelectasis. There is dependent atelectasis  posteriorly in the visualized lower lobes. 2 cm right middle lobe  nodule is stable and has been described as scans dating back to  01/12/2005. Bridging osteophytes across multiple levels in the mid  and lower thoracic spine. Sternum intact. 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  Review of the MIP images confirms the above findings.  CTA ABDOMEN AND PELVIS FINDINGS  IMPRESSION:  1. Negative for acute PE, aortic aneurysm, or dissection. No  significant aortoiliac occlusive disease.  2. Stable 2 cm right  middle lobe nodule since 2009, favoring a  benign process.  3. Diffuse left renal parenchymal atrophy possibly secondary to the  calcified renal artery ostial stenosis.  4. Descending and sigmoid diverticulosis.  :  Aorta: Scattered calcified plaque. No aneurysm, dissection, or  stenosis.  Celiac axis: Calcified nonocclusive ostial plaque, patent distally  Superior mesenteric: Partially calcified nonocclusive ostial  plaque  and some scattered eccentric nonocclusive plaque more distally  without high-grade stenosis.  Left renal: Single, with heavily calcified ostial plaque extending  over a length of less than 1 cm.  Right renal: Single, patent.  Inferior mesenteric: Patent  Left iliac: Minimal scattered atheromatous calcification. No  stenosis, aneurysm, dissection, or severe tortuosity.  Right iliac: Minimal calcified plaque in neck mid common iliac. No  dissection, aneurysm, stenosis, or significant tortuosity.  Venous findings: Venous phase imaging not obtained.  Review of the MIP images confirms the above findings.  Nonvascular findings: Physiologically distended gallbladder, spleen,  adrenal glands. There is diffuse left renal parenchymal atrophy with  mild compensatory hypertrophy on the right. Unremarkable pancreas.  Stomach, small bowel, and colon are nondilated. Innumerable  descending and sigmoid diverticula without adjacent  inflammatory/edematous change. Urinary bladder decompressed by Foley  catheter. Uterus absent. No ascites. No free air. No adenopathy.  Bridging spurs throughout the lumbar spine.  Electronically Signed  By: Arne Cleveland M.D.  On: 05/07/2013 16:01    Assessment/Plan: S/P Procedure(s) (LRB): LEFT AND RIGHT HEART CATHETERIZATION WITH CORONARY ANGIOGRAM (N/A)  Clinically stable, slightly improved Will tentatively plan for surgery next week (Wednesday March 11th) as long as she continues to improve She needs to be encouraged to ambulate at least twice daily  OWEN,CLARENCE H 05/08/2013 5:15 PM

## 2013-05-08 NOTE — Evaluation (Addendum)
Physical Therapy Evaluation Patient Details Name: [name] MRN: [id number] DOB: [birth year] Today's Date: 05/08/2013 Time: 2979-8921 PT Time Calculation (min): 21 min  PT Assessment / Plan / Recommendation History of Present Illness  Patient is a 73 year old obese widowed white female from [place] with remote history of rheumatic fever during childhood and long-standing history of chronic diastolic congestive heart failure who has recently experienced multiple recurrent episodes of acute exacerbation of chronic diastolic congestive heart failure.  She has been diagnosed with severe, symptomatic mitral regurgitation with recurrent paroxysmal atrial fibrillation   Clinical Impression  Pt very pleasant and dgtr present initially stating pt needs assist for mobility but with cueing able to perform with very limited assist. Pt with decreased activity tolerance, transfers and will benefit from acute therapy to maximize strength, function, and gait in preparation for MVR as well as post-op to return pt to PLOF. Recommend OOB and ambulation daily with nursing staff. HR variable with Afib throughout gait with 115-158 but typically 128-140 majority of gait. RN aware.     PT Assessment  Patient needs continued PT services    Follow Up Recommendations  Home health PT;Supervision - Intermittent;SNF (vs ST-SNF pending post-op status and family ability to assist)    Does the patient have the potential to tolerate intense rehabilitation      Barriers to Discharge        Equipment Recommendations  Rolling walker with 5" wheels    Recommendations for Other Services     Frequency Min 3X/week    Precautions / Restrictions Precautions Precautions: Fall Precaution Comments: watch HR Restrictions Weight Bearing Restrictions: No   Pertinent Vitals/Pain sats 90-94% on 2L  No pain      Mobility  Bed Mobility Overal bed mobility: Needs Assistance Bed Mobility: Supine to Sit Supine to sit:  Supervision General bed mobility comments: cues for rail use, increased time and encouragement to perform on her own Transfers Overall transfer level: Needs assistance Equipment used: Rolling walker (2 wheeled) Transfers: Sit to/from Stand Sit to Stand: Min guard General transfer comment: cueing for hand placement and anterior weight shift Ambulation/Gait Ambulation/Gait assistance: Min guard Ambulation Distance (Feet): 170 Feet Assistive device: Rolling walker (2 wheeled) Gait Pattern/deviations: Step-through pattern;Decreased stride length Gait velocity: decreased General Gait Details: pt benefits from RW use with cues throughout to step into RW and extend trunk. 3 standing rest breaks for HR to decline due to AFbi    Exercises     PT Diagnosis: Difficulty walking;Generalized weakness  PT Problem List: Decreased strength;Decreased activity tolerance;Decreased mobility;Decreased balance;Decreased knowledge of use of DME;Cardiopulmonary status limiting activity;Obesity;Pain;Decreased range of motion PT Treatment Interventions: DME instruction;Gait training;Stair training;Functional mobility training;Therapeutic activities;Therapeutic exercise;Patient/family education     PT Goals(Current goals can be found in the care plan section) Acute Rehab PT Goals Patient Stated Goal: return to sitting  PT Goal Formulation: With patient Time For Goal Achievement: 05/22/13 Potential to Achieve Goals: Good  Visit Information  Last PT Received On: 05/08/13 Assistance Needed: +1 History of Present Illness: Patient is a 73 year old obese widowed white female from [place] with remote history of rheumatic fever during childhood and long-standing history of chronic diastolic congestive heart failure who has recently experienced multiple recurrent episodes of acute exacerbation of chronic diastolic congestive heart failure.  She has been diagnosed with severe, symptomatic mitral regurgitation with  recurrent paroxysmal atrial fibrillation        Prior Functioning  Home Living Family/patient expects to be discharged to:: Private residence Living  Arrangements: Other relatives;Children (grandson, his wife and two children live in her home) Available Help at Discharge: Family;Available 24 hours/day Type of Home: House Home Access: Stairs to enter [employer] of Steps: 2 Entrance Stairs-Rails: Right Home Layout: One level Home Equipment: None Additional Comments: Pt did not use O2 at home PTA.  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 ) Dominant Hand: Right    Cognition  Cognition Arousal/Alertness: Awake/alert Behavior During Therapy: WFL for tasks assessed/performed Overall Cognitive Status: Within Functional Limits for tasks assessed    Extremity/Trunk Assessment Upper Extremity Assessment Upper Extremity Assessment: Generalized weakness Lower Extremity Assessment Lower Extremity Assessment: Generalized weakness RLE Deficits / Details:   RLE WFL LLE Deficits / Details: limited by OA needs tKA Cervical / Trunk Assessment Cervical / Trunk Assessment: Normal   Balance    End of Session PT - End of Session Equipment Utilized During Treatment: Oxygen;Gait belt Activity Tolerance: Patient tolerated treatment well Patient left: in chair;with call bell/phone within reach Nurse Communication: Mobility status  GP     Melford Aase 05/08/2013, 11:55 AM Elwyn Reach, [place]

## 2013-05-08 NOTE — Progress Notes (Signed)
ANTICOAGULATION CONSULT NOTE - Follow Up Consult  Pharmacy Consult for Heparin Indication: atrial fibrillation  Allergies  Allergen Reactions  . Indomethacin Other (See Comments)    dizziness  . Norvasc [Amlodipine Besylate] Cough    Patient Measurements: Height: 5\' 2"  (157.5 cm) Weight: 188 lb 4.4 oz (85.4 kg) IBW/kg (Calculated) : 50.1   Vital Signs: Temp: 98.7 F (37.1 C) (03/03 1141) Temp src: Oral (03/03 1141) BP: 112/70 mmHg (03/03 1141) Pulse Rate: 115 (03/03 0911)  Labs:  Recent Labs  05/06/13 0409  05/07/13 0250 05/07/13 0855 05/07/13 1500 05/08/13 0230 05/08/13 1340  HGB 11.2*  --   --  13.4  --  12.2  --   HCT 33.9*  --   --  38.3  --  35.9*  --   PLT 190  --   --  PLATELET CLUMPS NOTED ON SMEAR, COUNT APPEARS ADEQUATE  --  229  --   APTT 55*  < > 86*  --   --  103* 65*  HEPARINUNFRC 1.44*  --  0.76*  --   --  1.72*  --   CREATININE 1.24*  < > 1.27*  --  1.33* 1.54*  --   < > = values in this interval not displayed.  Estimated Creatinine Clearance: 33.5 ml/min (by C-G formula based on Cr of 1.54).   Medications:  Scheduled:  . darifenacin  7.5 mg Oral Daily  . simvastatin  10 mg Oral q1800  . sodium chloride  3 mL Intravenous Q12H  . torsemide  40 mg Oral BID   . sodium chloride Stopped (05/04/13 2100)  . amiodarone (NEXTERONE PREMIX) 360 mg/200 mL dextrose    . heparin 900 Units/hr (05/08/13 1141)    Assessment: 73 yo F initially started on apixiban on 2/23 for afib, however patient had to go into cath on 2/27 and was restarted post cath on heparin.   Heparin level remains elevated due to influence of apixaban (last dose 2/26), but trending down.  PTT just below goal range.  No bleeding or complications noted.  Goal of Therapy:  APTT 66-102 seconds Monitor platelets by anticoagulation protocol: Yes   Plan:  Increase IV heparin to 950 units/hr. Recheck PTT at 2100.  Won't recheck heparin level until tomorrow AM. Monitor for signs of  bleed  Uvaldo Rising, BCPS  Clinical Pharmacist Pager [phone]  05/08/2013 2:56 PM

## 2013-05-08 NOTE — Progress Notes (Signed)
ANTICOAGULATION CONSULT NOTE - Follow Up Consult  Pharmacy Consult for heparin Indication: atrial fibrillation  Labs:  Recent Labs  05/06/13 0409  05/06/13 1010  05/07/13 0250 05/07/13 0855 05/07/13 1500 05/08/13 0230  HGB 11.2*  --   --   --   --  13.4  --  12.2  HCT 33.9*  --   --   --   --  38.3  --  35.9*  PLT 190  --   --   --   --  PLATELET CLUMPS NOTED ON SMEAR, COUNT APPEARS ADEQUATE  --  229  APTT 55*  --  107*  --  86*  --   --  103*  HEPARINUNFRC 1.44*  --   --   --  0.76*  --   --  1.72*  CREATININE 1.24*  < >  --   < > 1.27*  --  1.33* 1.54*  < > = values in this interval not displayed.   Assessment: 73yo female now supratherapeutic on heparin after several days at goal.  Goal of Therapy:  Heparin level 0.3-0.7 units/ml aPTT 66-102 seconds   Plan:  Will decrease heparin gtt slightly to 900 units/hr and check level/PTT in [place], PharmD, BCPS  05/08/2013,5:59 AM

## 2013-05-08 NOTE — Progress Notes (Signed)
[name], female   DOB: [birth year], 73 y.o.   MRN: [id number]    SUBJECTIVE: Breathing better, much less edema.  Still requiring oxygen. Walked with rehab yesterday. In and out of atrial fibrillation yesterday.    Scheduled Meds: . darifenacin  7.5 mg Oral Daily  . simvastatin  10 mg Oral q1800  . sodium chloride  3 mL Intravenous Q12H   Continuous Infusions: . sodium chloride Stopped (05/04/13 2100)  . amiodarone (NEXTERONE PREMIX) 360 mg/200 mL dextrose    . furosemide (LASIX) infusion 10 mg/hr (05/07/13 0956)  . heparin 900 Units/hr (05/08/13 0558)   PRN Meds:.sodium chloride, acetaminophen, albuterol, HYDROcodone-acetaminophen, ipratropium-albuterol, LORazepam, morphine injection, sodium chloride    Filed Vitals:   05/07/13 2027 05/07/13 2320 05/08/13 0423 05/08/13 0432  BP: 125/45 99/43 114/48   Pulse:      Temp: 98 F (36.7 C) 97.5 F (36.4 C) 97.6 F (36.4 C)   TempSrc: Oral Oral Oral   Resp: 14 16 15    Height:      Weight:   85.4 kg (188 lb 4.4 oz) 85.4 kg (188 lb 4.4 oz)  SpO2: 96% 97% 98%     Intake/Output Summary (Last 24 hours) at 05/08/13 0736 Last data filed at 05/08/13 0600  Gross per 24 hour  Intake 1519.97 ml  Output   1853 ml  Net -333.03 ml    LABS: Basic Metabolic Panel:  Recent Labs  05/07/13 1500 05/08/13 0230  NA 133* 137  K 3.6* 3.6*  CL 85* 87*  CO2 37* 37*  GLUCOSE 109* 111*  BUN 42* 47*  CREATININE 1.33* 1.54*  CALCIUM 9.7 10.1   Liver Function Tests:  Recent Labs  05/08/13 0230  AST 15  ALT 10  ALKPHOS 97  BILITOT 0.4  PROT 6.9  ALBUMIN 3.0*   No results found for this basename: LIPASE, AMYLASE,  in the last 72 hours CBC:  Recent Labs  05/07/13 0855 05/08/13 0230  WBC 7.9 6.3  NEUTROABS 5.4  --   HGB 13.4 12.2  HCT 38.3 35.9*  MCV 93.9 95.0  PLT PLATELET CLUMPS NOTED ON SMEAR, COUNT APPEARS ADEQUATE 229   Cardiac Enzymes: No results found for this basename: CKTOTAL, CKMB,  CKMBINDEX, TROPONINI,  in the last 72 hours BNP: No components found with this basename: POCBNP,  D-Dimer: No results found for this basename: DDIMER,  in the last 72 hours Hemoglobin A1C: No results found for this basename: HGBA1C,  in the last 72 hours Fasting Lipid Panel: No results found for this basename: CHOL, HDL, LDLCALC, TRIG, CHOLHDL, LDLDIRECT,  in the last 72 hours Thyroid Function Tests: No results found for this basename: TSH, T4TOTAL, FREET3, T3FREE, THYROIDAB,  in the last 72 hours Anemia Panel: No results found for this basename: VITAMINB12, FOLATE, FERRITIN, TIBC, IRON, RETICCTPCT,  in the last 72 hours   PHYSICAL EXAM General: NAD, frail Neck: JVP 8 cm, no thyromegaly or thyroid nodule.  Lungs: Dependent crackles CV: Nondisplaced PMI.  Heart regular S1/S2, no S3/S4, 2/6 HSM apex.  Trace ankle edema.  No carotid bruit.  Normal pedal pulses.  Abdomen: Soft, nontender, no hepatosplenomegaly, no distention.  Neurologic: sleeping currently Extremities: No clubbing or cyanosis.   TELEMETRY: Reviewed telemetry pt in NSR, HR 60s  ASSESSMENT AND PLAN: 73 yo was [facility] to [facility] for evaluation of mitral regurgitation for potential repair.  She has acute on chronic diastolic CHF and paroxysmal atrial fibrillation.   1. Mitral regurgitation: Suspect severe  MR.  2 moderate jets on TEE, additively severe.  Large V-waves on RHC.  Evaluated by CVTS with plan for MV repair and Maze.  She needs tuning up prior to this given CHF.  She does look frail but says she is independent in her ADLs at home. Work with PT today.  2. Acute on chronic diastolic CHF: Patient is volume overloaded on exam and by [place] Friday.  She was noted to have severe pulmonary HTN on RHC, but PVR only 3.3 WU, suggesting that this was mostly pulmonary venous hypertension.  She diuresed well with Lasix gtt with improvement in breathing.  This morning, volume status is better.  Creatinine is rising and she got  contrast for a CTA yesterday, so I am going to stop Lasix gtt for now and put her on torsemide 40 mg po bid.  3. Atrial fibrillation: Patient has been in and out of atrial fibrillation.  Will keep her on amiodarone gtt to try to load her faster.  She is in NSR this morning.  She is on heparin gtt.  Eventually want to get her back on Eliquis, likely after MV surgery.  Will need Maze as well.   4. Renal dysfunction:  Creatinine rising this morning and she got contrast yesterday.  Will stop Lasix gtt and convert to po diuretic.    [name] 05/08/2013 7:36 AM

## 2013-05-09 ENCOUNTER — Inpatient Hospital Stay (HOSPITAL_COMMUNITY): Payer: Medicare HMO

## 2013-05-09 DIAGNOSIS — I059 Rheumatic mitral valve disease, unspecified: Secondary | ICD-10-CM | POA: Diagnosis not present

## 2013-05-09 DIAGNOSIS — I4891 Unspecified atrial fibrillation: Secondary | ICD-10-CM | POA: Diagnosis not present

## 2013-05-09 DIAGNOSIS — I509 Heart failure, unspecified: Secondary | ICD-10-CM | POA: Diagnosis not present

## 2013-05-09 DIAGNOSIS — I5033 Acute on chronic diastolic (congestive) heart failure: Secondary | ICD-10-CM | POA: Diagnosis not present

## 2013-05-09 LAB — PULMONARY FUNCTION TEST
FEF 25-75 POST: 1.75 L/s
FEF 25-75 PRE: 1.87 L/s
FEF2575-%Change-Post: -6 %
FEF2575-%PRED-PRE: 111 %
FEF2575-%Pred-Post: 104 %
FEV1-%CHANGE-POST: -2 %
FEV1-%PRED-PRE: 52 %
FEV1-%Pred-Post: 50 %
FEV1-Post: 1.02 L
FEV1-Pre: 1.05 L
FEV1FVC-%Change-Post: 2 %
FEV1FVC-%Pred-Pre: 122 %
FEV6-%CHANGE-POST: -4 %
FEV6-%PRED-POST: 42 %
FEV6-%Pred-Pre: 44 %
FEV6-POST: 1.08 L
FEV6-PRE: 1.13 L
FEV6FVC-%PRED-POST: 105 %
FEV6FVC-%PRED-PRE: 105 %
FVC-%Change-Post: -4 %
FVC-%PRED-POST: 40 %
FVC-%PRED-PRE: 42 %
FVC-Post: 1.08 L
FVC-Pre: 1.13 L
POST FEV6/FVC RATIO: 100 %
Post FEV1/FVC ratio: 94 %
Pre FEV1/FVC ratio: 92 %
Pre FEV6/FVC Ratio: 100 %

## 2013-05-09 LAB — BASIC METABOLIC PANEL
BUN: 52 mg/dL — AB (ref 6–23)
CHLORIDE: 84 meq/L — AB (ref 96–112)
CO2: 35 mEq/L — ABNORMAL HIGH (ref 19–32)
Calcium: 9.9 mg/dL (ref 8.4–10.5)
Creatinine, Ser: 1.94 mg/dL — ABNORMAL HIGH (ref 0.50–1.10)
GFR, EST AFRICAN AMERICAN: 29 mL/min — AB (ref >90)
GFR, EST NON AFRICAN AMERICAN: 25 mL/min — AB (ref >90)
GLUCOSE: 139 mg/dL — AB (ref 70–99)
POTASSIUM: 3.8 meq/L (ref 3.7–5.3)
SODIUM: 135 meq/L — AB (ref 137–147)

## 2013-05-09 LAB — CBC
HEMATOCRIT: 35.2 % — AB (ref 36.0–46.0)
Hemoglobin: 12.4 g/dL (ref 12.0–15.0)
MCH: 33.2 pg (ref 26.0–34.0)
MCHC: 35.2 g/dL (ref 30.0–36.0)
MCV: 94.1 fL (ref 78.0–100.0)
PLATELETS: 230 10*3/uL (ref 150–400)
RBC: 3.74 MIL/uL — AB (ref 3.87–5.11)
RDW: 12.9 % (ref 11.5–15.5)
WBC: 6 10*3/uL (ref 4.0–10.5)

## 2013-05-09 LAB — APTT: aPTT: 84 seconds — ABNORMAL HIGH (ref 24–37)

## 2013-05-09 LAB — HEPARIN LEVEL (UNFRACTIONATED): HEPARIN UNFRACTIONATED: 0.79 [IU]/mL — AB (ref 0.30–0.70)

## 2013-05-09 MED ORDER — ALBUTEROL SULFATE (2.5 MG/3ML) 0.083% IN NEBU
2.5000 mg | INHALATION_SOLUTION | Freq: Once | RESPIRATORY_TRACT | Status: AC
  Administered 2013-05-09: 2.5 mg via RESPIRATORY_TRACT

## 2013-05-09 MED ORDER — ALUM & MAG HYDROXIDE-SIMETH 200-200-20 MG/5ML PO SUSP
30.0000 mL | ORAL | Status: DC | PRN
  Administered 2013-05-09: 30 mL via ORAL
  Filled 2013-05-09: qty 30

## 2013-05-09 NOTE — Progress Notes (Signed)
[name], female   DOB: 05-28-1940, 73 y.o.   MRN: [id number]    SUBJECTIVE: Stable breathing.  Walking with PT.  Creatinine up again today. She was taken off IV Lasix yesterday morning.   Scheduled Meds: . darifenacin  7.5 mg Oral Daily  . simvastatin  10 mg Oral q1800  . sodium chloride  3 mL Intravenous Q12H  . torsemide  40 mg Oral BID   Continuous Infusions: . sodium chloride Stopped (05/04/13 2100)  . amiodarone (NEXTERONE PREMIX) 360 mg/200 mL dextrose 30 mg/hr (05/09/13 0038)  . heparin 950 Units/hr (05/09/13 0037)   PRN Meds:.sodium chloride, acetaminophen, albuterol, alum & mag hydroxide-simeth, HYDROcodone-acetaminophen, ipratropium-albuterol, LORazepam, morphine injection, sodium chloride    Filed Vitals:   05/09/13 0034 05/09/13 0049 05/09/13 0400 05/09/13 0405  BP: 116/96   92/46  Pulse:    66  Temp: 98 F (36.7 C)  98.1 F (36.7 C)   TempSrc: Oral  Oral   Resp: 18 17  16   Height:      Weight:    85.4 kg (188 lb 4.4 oz)  SpO2: 96% 94%  94%    Intake/Output Summary (Last 24 hours) at 05/09/13 0736 Last data filed at 05/09/13 0600  Gross per 24 hour  Intake 1328.09 ml  Output    650 ml  Net 678.09 ml    LABS: Basic Metabolic Panel:  Recent Labs  05/08/13 0230 05/09/13 0300  NA 137 135*  K 3.6* 3.8  CL 87* 84*  CO2 37* 35*  GLUCOSE 111* 139*  BUN 47* 52*  CREATININE 1.54* 1.94*  CALCIUM 10.1 9.9   Liver Function Tests:  Recent Labs  05/08/13 0230  AST 15  ALT 10  ALKPHOS 97  BILITOT 0.4  PROT 6.9  ALBUMIN 3.0*   No results found for this basename: LIPASE, AMYLASE,  in the last 72 hours CBC:  Recent Labs  05/07/13 0855 05/08/13 0230 05/09/13 0300  WBC 7.9 6.3 6.0  NEUTROABS 5.4  --   --   HGB 13.4 12.2 12.4  HCT 38.3 35.9* 35.2*  MCV 93.9 95.0 94.1  PLT PLATELET CLUMPS NOTED ON SMEAR, COUNT APPEARS ADEQUATE 229 230   Cardiac Enzymes: No results found for this basename: CKTOTAL, CKMB, CKMBINDEX,  TROPONINI,  in the last 72 hours BNP: No components found with this basename: POCBNP,  D-Dimer: No results found for this basename: DDIMER,  in the last 72 hours Hemoglobin A1C:  Recent Labs  05/08/13 0230  HGBA1C 6.0*   Fasting Lipid Panel: No results found for this basename: CHOL, HDL, LDLCALC, TRIG, CHOLHDL, LDLDIRECT,  in the last 72 hours Thyroid Function Tests: No results found for this basename: TSH, T4TOTAL, FREET3, T3FREE, THYROIDAB,  in the last 72 hours Anemia Panel: No results found for this basename: VITAMINB12, FOLATE, FERRITIN, TIBC, IRON, RETICCTPCT,  in the last 72 hours   PHYSICAL EXAM General: NAD, frail Neck: JVP 8 cm, no thyromegaly or thyroid nodule.  Lungs: Dependent crackles CV: Nondisplaced PMI.  Heart regular S1/S2, no S3/S4, 2/6 HSM apex.  Trace ankle edema.  No carotid bruit.  Normal pedal pulses.  Abdomen: Soft, nontender, no hepatosplenomegaly, no distention.  Neurologic: sleeping currently Extremities: No clubbing or cyanosis.   TELEMETRY: Reviewed telemetry pt in NSR, HR 60s  ASSESSMENT AND PLAN: 73 yo was transferred to [facility] for evaluation of mitral regurgitation for potential repair.  She has acute on chronic diastolic CHF and paroxysmal atrial fibrillation.   1.  Mitral regurgitation: Suspect severe MR.  2 moderate jets on TEE, additively severe.  Large V-waves on RHC.  Evaluated by CVTS with plan for MV repair and Maze, tentatively next Wednesday.  She needs tuning up prior to this given CHF.  She does look frail but says she is independent in her ADLs at home. She needs aggressive PT work, bid walking.  2. Acute on chronic diastolic CHF: Patient is volume overloaded on exam and by [place] Friday.  She was noted to have severe pulmonary HTN on RHC, but PVR only 3.3 WU, suggesting that this was mostly pulmonary venous hypertension.  She diuresed well with Lasix gtt with improvement in breathing.  Volume status is better.  Creatinine rising after getting  contrast, she is now on po diuretics.  3. Atrial fibrillation: Patient has been in and out of atrial fibrillation.  Will keep her on amiodarone gtt one more day to try to load her faster.  She is in NSR this morning.  She is on heparin gtt.  Eventually want to get her back on Eliquis, likely after MV surgery.  Will need Maze as well.   4. Renal dysfunction:  Creatinine rising this morning again, possibly contrast nephropathy. Can continue po diuretic for now (do not think she is dry/hypovolemic).    [name] 05/09/2013 7:36 AM

## 2013-05-09 NOTE — Progress Notes (Addendum)
Clinical Social Work Department BRIEF PSYCHOSOCIAL ASSESSMENT 05/09/2013  Patient:  [name] [name], [name] [name]     Account Number:  [id number]     Admit date:  04/28/2013  Clinical Social Worker:  Freeman Caldron  Date/Time:  05/09/2013 10:54 AM  Referred by:  Physician  Date Referred:  05/09/2013 Referred for  SNF Placement   Other Referral:   Interview type:  Patient Other interview type:   CSW also spoke with pt's daughter [name] [name] at bedside.    PSYCHOSOCIAL DATA Living Status:  FAMILY Admitted from facility:   Level of care:   Primary support name:  [name] [name] [phone]) Primary support relationship to patient:  CHILD, ADULT Degree of support available:   Good--pt has support from family. Was living with grandson prior to discharge. Daughter [name] [name] at bedside.    CURRENT CONCERNS Current Concerns  Post-Acute Placement   Other Concerns:    SOCIAL WORK ASSESSMENT / PLAN CSW read PT note and [facility] note. CSW met with pt and her daughter [name] [name] in room. Pt states she was living with her grandson; pt has 7 great-grandchildren and 4 grandchildren. Daughter [name] [name] was in the room with pt when CSW came to complete assessment. Pt spoke at length with CSW about her relationship with her great-grandchildren, particularly her 58 and 52-year old great-grands. Pt reminisced on taking them out to eat, and states "that won't happen for a while" and pt feels she has been in the hospital for "months." Pt's daughter states pt has been in and out of the hospital since last Decemeber. Pt had a knee replacement a few years ago and went to [facility] for rehab; family states they would prefer pt go to the [facility] this time for rehab if possible. Pt and family believe short-term rehab is best for pt, as she is tentatively scheduled for operation next Wednesday. Pt's daughter explains pt has a "leaky valve" and that the MDs need to "fix or replace" this. Daughter expressed some  frustration with timing of the procedure, stating the medical team has "dragged [their] feet" with the procedure and daughter believes this procedure should have happened months ago. CSW provided support, and asked if pt has any questions or concerns about rehab. Pt states she does not. CSW has made referrals to [facility] and [facility] and will update family when facilities respond to request. CSW called [employer] rep Ok Edwards concerning insurance authorization and left a voicemail requesting a return call, explaining pt recommended for SNF and CSW wanted to inquire about insurance auth. CSW waiting for return call from [facility]. Pt received bed offer from [facility]. CSW will inform pt and daughter [name] [name].  Addendum: CSW called Erica with [employer] and left voicemail requesting a call back if CSW needs to submit to [place] for insurance auth.   Assessment/plan status:  Psychosocial Support/Ongoing Assessment of Needs Other assessment/ plan:   Information/referral to community resources:   SNF [facility] first choice; [facility] second choice).    PATIENT'S/FAMILY'S RESPONSE TO PLAN OF CARE: Good--pt and daughter engaged in conversation with CSW. Pt and daughter friendly, and CSW provided support as pt and daughter [name] [name] described feeling like       Ky Barban, MSW, [facility] Clinical Social Worker [phone]

## 2013-05-09 NOTE — Progress Notes (Addendum)
Clinical Social Work Department CLINICAL SOCIAL WORK PLACEMENT NOTE 05/09/2013  Patient:  [name] [name], [name] [name]  Account Number:  [id number] Admit date:  04/28/2013  Clinical Social Worker:  Ky Barban, Latanya Presser  Date/time:  05/09/2013 11:22 AM  Clinical Social Work is seeking post-discharge placement for this patient at the following level of care:   SKILLED NURSING   (*CSW will update this form in Epic as items are completed)   N/A-clinicals sent to [facility] and [facility] SNF at family request  Patient/family provided with [place] Department of Clinical Social Work's list of facilities offering this level of care within the geographic area requested by the patient (or if unable, by the patient's family).  05/09/2013  Patient/family informed of their freedom to choose among providers that offer the needed level of care, that participate in Medicare, Medicaid or managed care program needed by the patient, have an available bed and are willing to accept the patient.  N/A-pt received bed offer from first choice facility [facility]) and is no longer considering this facility  Patient/family informed of [facility]' ownership interest in [facility], as well as of the fact that they are under no obligation to receive care at this facility.  PASARR submitted to EDS on 05/09/2013 PASARR number received from EDS on 05/09/2013  FL2 transmitted to all facilities in geographic area requested by pt/family on  05/09/2013 FL2 transmitted to all facilities within larger geographic area on   Patient informed that his/her managed care company has contracts with or will negotiate with  certain facilities, including the following:     Patient/family informed of bed offers received:  05/09/2013 Patient chooses bed at [facility] Physician recommends and patient chooses bed at    Patient to be transferred to [facility] on   Patient to be transferred to facility by    The following physician request were entered in Epic:   Additional Comments:    Ky Barban, MSW, [place] Worker [phone]

## 2013-05-09 NOTE — Progress Notes (Signed)
CSW noticed PT note stating pt will either go home with home health or will need short-term SNF depending on family's ability to provide assistance post-op. CSW also saw note from [facility], and called Tim and left him a voicemail asking if he has any knowledge about pt's case concerning discharge plan. CSW explained PT recommendation depends on family support at discharge, and CSW is going to pt's room to speak with pt and family about disposition and discharge planning. CSW will update Tim when he calls back and will discuss appropriate discharge plan and how CSW can assist once Octavia Bruckner returns call.   Ky Barban, MSW, [facility] Clinical Social Worker [phone]

## 2013-05-09 NOTE — Progress Notes (Signed)
CARDIAC REHAB PHASE I   PRE:  Rate/Rhythm: 70 SR    BP: sitting 113/53    SaO2: 97 3L  MODE:  Ambulation: 180 ft   POST:  Rate/Rhythm: 155 afib    BP: sitting 107/47     SaO2: 100 4L  Pt thankful to walk. Assist x1 to get OOB. Used gait belt and pt pushed w/c with 4L O2. Pt tolerated fairly well, improved today. Sts her knee wasn't as painful today and able to increase distance. HR however increased to 140s aifb, max 155. Pt asymptomatic. Denied SOB. To recliner after walk. Began practicing her IS. Will continue to follow. [phone]   Josephina Shih [place] CES, ACSM 05/09/2013 3:17 PM

## 2013-05-09 NOTE — Progress Notes (Addendum)
CSW called Mendel Ryder with [facility] and Mendel Ryder verified pt is theirs--CSW explained discharge likely end of next week as pt is tentatively scheduled for surgery next Wednesday. Pt going to [facility]. [employer] took this information down and will follow up with CSW next week for insurance auth.  Addendum: CSW informed pt and daughter [name] of bed offer from [facility]. [name] asked if there will be a co-pay and how much this would be. CSW explained CSW will call facility and ask them to call [name] to provide this information. CSW called facility and placed on hold for a few minutes. CSW hung up and will try again later.  Ky Barban, MSW, [facility] Clinical Social Worker [phone]

## 2013-05-09 NOTE — Progress Notes (Signed)
[place] for Heparin Indication: atrial fibrillation  Allergies  Allergen Reactions  . Indomethacin Other (See Comments)    dizziness  . Norvasc [Amlodipine Besylate] Cough    Patient Measurements: Height: 5\' 2"  (157.5 cm) Weight: 188 lb 4.4 oz (85.4 kg) IBW/kg (Calculated) : 50.1   Vital Signs: Temp: 98.1 F (36.7 C) (03/04 0400) Temp src: Oral (03/04 0400) BP: 92/46 mmHg (03/04 0405) Pulse Rate: 66 (03/04 0405)  Labs:  Recent Labs  05/07/13 0855 05/07/13 1500 05/08/13 0230 05/08/13 1340 05/08/13 1942 05/09/13 0300  HGB 13.4  --  12.2  --   --  12.4  HCT 38.3  --  35.9*  --   --  35.2*  PLT PLATELET CLUMPS NOTED ON SMEAR, COUNT APPEARS ADEQUATE  --  229  --   --  230  APTT  --   --  103* 65* 70* 84*  HEPARINUNFRC  --   --  1.72* 1.14*  --  0.79*  CREATININE  --  1.33* 1.54*  --   --  1.94*    Estimated Creatinine Clearance: 26.6 ml/min (by C-G formula based on Cr of 1.94).   Medications:  Scheduled:  . darifenacin  7.5 mg Oral Daily  . simvastatin  10 mg Oral q1800  . sodium chloride  3 mL Intravenous Q12H  . torsemide  40 mg Oral BID   . sodium chloride Stopped (05/04/13 2100)  . amiodarone (NEXTERONE PREMIX) 360 mg/200 mL dextrose 30 mg/hr (05/09/13 0038)  . heparin 950 Units/hr (05/09/13 0037)    Assessment: 73 yo F initially started on apixiban on 2/23 for afib, however patient had to go into cath on 2/27 and was restarted post cath on heparin. Plan for MV surgery with possible MAZE procedure next week, will continue IV heparin for now.  Heparin level remains elevated due to influence of apixaban (last dose 2/26), but trending down.  PTT within goal range(84).  No bleeding or complications noted.  Goal of Therapy:  APTT 66-102 seconds Heparin level 0.3-0.7 Monitor platelets by anticoagulation protocol: Yes   Plan:  Continue IV heparin at 950 units/hr. Daily HL for now - should be completely  correlated Monitor for signs of bleeding  Erin Hearing PharmD., BCPS Clinical Pharmacist Pager [phone] 05/09/2013 10:47 AM

## 2013-05-10 DIAGNOSIS — I4891 Unspecified atrial fibrillation: Secondary | ICD-10-CM | POA: Diagnosis not present

## 2013-05-10 DIAGNOSIS — I5033 Acute on chronic diastolic (congestive) heart failure: Secondary | ICD-10-CM | POA: Diagnosis not present

## 2013-05-10 DIAGNOSIS — I509 Heart failure, unspecified: Secondary | ICD-10-CM | POA: Diagnosis not present

## 2013-05-10 DIAGNOSIS — I059 Rheumatic mitral valve disease, unspecified: Secondary | ICD-10-CM | POA: Diagnosis not present

## 2013-05-10 LAB — BASIC METABOLIC PANEL
BUN: 56 mg/dL — AB (ref 6–23)
CO2: 36 mEq/L — ABNORMAL HIGH (ref 19–32)
CREATININE: 2.06 mg/dL — AB (ref 0.50–1.10)
Calcium: 9.7 mg/dL (ref 8.4–10.5)
Chloride: 85 mEq/L — ABNORMAL LOW (ref 96–112)
GFR calc Af Amer: 27 mL/min — ABNORMAL LOW (ref >90)
GFR, EST NON AFRICAN AMERICAN: 23 mL/min — AB (ref >90)
GLUCOSE: 122 mg/dL — AB (ref 70–99)
POTASSIUM: 3.9 meq/L (ref 3.7–5.3)
Sodium: 134 mEq/L — ABNORMAL LOW (ref 137–147)

## 2013-05-10 LAB — CBC
HEMATOCRIT: 35.8 % — AB (ref 36.0–46.0)
HEMOGLOBIN: 12.2 g/dL (ref 12.0–15.0)
MCH: 31.9 pg (ref 26.0–34.0)
MCHC: 34.1 g/dL (ref 30.0–36.0)
MCV: 93.5 fL (ref 78.0–100.0)
Platelets: 253 10*3/uL (ref 150–400)
RBC: 3.83 MIL/uL — ABNORMAL LOW (ref 3.87–5.11)
RDW: 12.8 % (ref 11.5–15.5)
WBC: 7.9 10*3/uL (ref 4.0–10.5)

## 2013-05-10 LAB — HEPARIN LEVEL (UNFRACTIONATED): Heparin Unfractionated: 0.64 IU/mL (ref 0.30–0.70)

## 2013-05-10 NOTE — Progress Notes (Signed)
[place] for Heparin Indication: atrial fibrillation  Allergies  Allergen Reactions  . Indomethacin Other (See Comments)    dizziness  . Norvasc [Amlodipine Besylate] Cough    Patient Measurements: Height: 5\' 2"  (157.5 cm) Weight: 187 lb 9.8 oz (85.1 kg) IBW/kg (Calculated) : 50.1   Vital Signs: Temp: 98 F (36.7 C) (03/05 0736) Temp src: Oral (03/05 0736) BP: 114/54 mmHg (03/05 0736) Pulse Rate: 54 (03/05 0736)  Labs:  Recent Labs  05/08/13 0230 05/08/13 1340 05/08/13 1942 05/09/13 0300 05/10/13 0245  HGB 12.2  --   --  12.4 12.2  HCT 35.9*  --   --  35.2* 35.8*  PLT 229  --   --  230 253  APTT 103* 65* 70* 84*  --   HEPARINUNFRC 1.72* 1.14*  --  0.79* 0.64  CREATININE 1.54*  --   --  1.94* 2.06*    Estimated Creatinine Clearance: 25 ml/min (by C-G formula based on Cr of 2.06).   Medications:  Scheduled:  . darifenacin  7.5 mg Oral Daily  . simvastatin  10 mg Oral q1800  . sodium chloride  3 mL Intravenous Q12H   . sodium chloride Stopped (05/04/13 2100)  . amiodarone (NEXTERONE PREMIX) 360 mg/200 mL dextrose 30 mg/hr (05/10/13 0215)  . heparin 950 Units/hr (05/09/13 0037)    Assessment: 73 yo F initially started on apixiban on 2/23 for afib, however patient had to go into cath on 2/27 and was restarted post cath on heparin. Plan for MV surgery with possible MAZE procedure next week, will continue IV heparin for now.  Heparin level now at goal after apixaban washout, apixaban (last dose 2/26). No bleeding or complications noted.  Goal of Therapy:  APTT 66-102 seconds Heparin level 0.3-0.7 Monitor platelets by anticoagulation protocol: Yes   Plan:  Continue IV heparin at 950 units/hr. Daily HL  Monitor for signs of bleeding  Erin Hearing PharmD., BCPS Clinical Pharmacist Pager [phone] 05/10/2013 7:52 AM

## 2013-05-10 NOTE — Progress Notes (Signed)
[name], female   DOB: 03/27/1940, 73 y.o.   MRN: [id number]   SUBJECTIVE: Stable breathing.  Walking with PT.  Creatinine hopefully has plateaued.  She had some atrial fibrillation yesterday.  Scheduled Meds: . darifenacin  7.5 mg Oral Daily  . simvastatin  10 mg Oral q1800  . sodium chloride  3 mL Intravenous Q12H   Continuous Infusions: . sodium chloride Stopped (05/04/13 2100)  . amiodarone (NEXTERONE PREMIX) 360 mg/200 mL dextrose 30 mg/hr (05/10/13 0215)  . heparin 950 Units/hr (05/09/13 0037)   PRN Meds:.sodium chloride, acetaminophen, albuterol, alum & mag hydroxide-simeth, HYDROcodone-acetaminophen, ipratropium-albuterol, LORazepam, morphine injection, sodium chloride    Filed Vitals:   05/10/13 0000 05/10/13 0310 05/10/13 0400 05/10/13 0736  BP: 110/45 105/42  114/54  Pulse:    54  Temp:   97.8 F (36.6 C) 98 F (36.7 C)  TempSrc:   Oral Oral  Resp: 16 16  14   Height:      Weight:   85.1 kg (187 lb 9.8 oz)   SpO2: 93% 96%  95%    Intake/Output Summary (Last 24 hours) at 05/10/13 0738 Last data filed at 05/10/13 0500  Gross per 24 hour  Intake  602.6 ml  Output   2350 ml  Net -1747.4 ml    LABS: Basic Metabolic Panel:  Recent Labs  05/09/13 0300 05/10/13 0245  NA 135* 134*  K 3.8 3.9  CL 84* 85*  CO2 35* 36*  GLUCOSE 139* 122*  BUN 52* 56*  CREATININE 1.94* 2.06*  CALCIUM 9.9 9.7   Liver Function Tests:  Recent Labs  05/08/13 0230  AST 15  ALT 10  ALKPHOS 97  BILITOT 0.4  PROT 6.9  ALBUMIN 3.0*   No results found for this basename: LIPASE, AMYLASE,  in the last 72 hours CBC:  Recent Labs  05/07/13 0855  05/09/13 0300 05/10/13 0245  WBC 7.9  < > 6.0 7.9  NEUTROABS 5.4  --   --   --   HGB 13.4  < > 12.4 12.2  HCT 38.3  < > 35.2* 35.8*  MCV 93.9  < > 94.1 93.5  PLT PLATELET CLUMPS NOTED ON SMEAR, COUNT APPEARS ADEQUATE  < > 230 253  < > = values in this interval not displayed. Cardiac Enzymes: No  results found for this basename: CKTOTAL, CKMB, CKMBINDEX, TROPONINI,  in the last 72 hours BNP: No components found with this basename: POCBNP,  D-Dimer: No results found for this basename: DDIMER,  in the last 72 hours Hemoglobin A1C:  Recent Labs  05/08/13 0230  HGBA1C 6.0*   Fasting Lipid Panel: No results found for this basename: CHOL, HDL, LDLCALC, TRIG, CHOLHDL, LDLDIRECT,  in the last 72 hours Thyroid Function Tests: No results found for this basename: TSH, T4TOTAL, FREET3, T3FREE, THYROIDAB,  in the last 72 hours Anemia Panel: No results found for this basename: VITAMINB12, FOLATE, FERRITIN, TIBC, IRON, RETICCTPCT,  in the last 72 hours   PHYSICAL EXAM General: NAD, frail Neck: JVP 7 cm, no thyromegaly or thyroid nodule.  Lungs: Dependent crackles CV: Nondisplaced PMI.  Heart regular S1/S2, no S3/S4, 2/6 HSM apex.  Trace ankle edema.  No carotid bruit.  Normal pedal pulses.  Abdomen: Soft, nontender, no hepatosplenomegaly, no distention.  Neurologic: sleeping currently Extremities: No clubbing or cyanosis.   TELEMETRY: Reviewed telemetry pt in NSR, HR 60s  ASSESSMENT AND PLAN: 73 yo was transferred to [facility] for evaluation of mitral regurgitation for potential  repair.  She has acute on chronic diastolic CHF and paroxysmal atrial fibrillation.   1. Mitral regurgitation: Suspect severe MR.  2 moderate jets on TEE, additively severe.  Large V-waves on RHC.  Evaluated by CVTS with plan for MV repair and Maze, tentatively next Wednesday.  She needs tuning up prior to this given CHF.  She does look frail but says she is independent in her ADLs at home. She needs aggressive PT work, bid walking.  2. Acute on chronic diastolic CHF: Patient is volume overloaded on exam and by [place] Friday.  She was noted to have severe pulmonary HTN on RHC, but PVR only 3.3 WU, suggesting that this was mostly pulmonary venous hypertension.  She diuresed well with Lasix gtt with improvement in breathing.   Volume status is better.  Creatinine rising after getting contrast, she is now on po diuretics. I am going to hold torsemide today, likely restart tomorrow.  Will watch her carefully today for worsening dyspnea.  3. Atrial fibrillation: Patient continues to be in and out of atrial fibrillation.  Will keep her on amiodarone gtt today.  She is in NSR this morning.  She is on heparin gtt.  Eventually want to get her back on Eliquis, likely after MV surgery.  Will need Maze as well.   4. Renal dysfunction:  Hopefully creatinine has plateaued, possibly contrast nephropathy. Hold diuretic today, hopefully resume tomorrow.  5. She will stay here until surgery.    [name] 05/10/2013 7:38 AM

## 2013-05-10 NOTE — Progress Notes (Signed)
Physical Therapy Treatment Patient Details Name: [name] [name] MRN: [id number] DOB: [birth year] Today's Date: 05/10/2013 Time: 1206-1232 PT Time Calculation (min): 26 min  PT Assessment / Plan / Recommendation  History of Present Illness Patient is a 73 year old obese widowed white female from [place] with remote history of rheumatic fever during childhood and long-standing history of chronic diastolic congestive heart failure who has recently experienced multiple recurrent episodes of acute exacerbation of chronic diastolic congestive heart failure.  She has been diagnosed with severe, symptomatic mitral regurgitation with recurrent paroxysmal atrial fibrillation    PT Comments   Pt with excellent progression with gait and continues to need assist with transfers OOB but much better use of RW today. Pt able to maintain 90-96% on 1L with gait and 99% at rest on 1L end of session. HR 56-73. Pt encouraged to continue mobility and HEP. Pt educated for using RLE to assist LLE into and OOB as needed. Dgtr present throughout  Follow Up Recommendations  SNF (ST-SNF if family unable to provide assist at home)     Does the patient have the potential to tolerate intense rehabilitation     Barriers to Discharge        Equipment Recommendations  Rolling walker with 5" wheels    Recommendations for Other Services    Frequency     Progress towards PT Goals Progress towards PT goals: Progressing toward goals  Plan Current plan remains appropriate    Precautions / Restrictions Precautions Precautions: Fall Precaution Comments: watch HR   Pertinent Vitals/Pain No pain    Mobility  Bed Mobility Overal bed mobility: Needs Assistance Bed Mobility: Supine to Sit Supine to sit: Min assist General bed mobility comments: cues for rail, sequence, increased time and assist only to fully elevate trunk from [place] Transfers Overall transfer level: Needs assistance Transfers: Sit to/from  Stand Sit to Stand: Min guard General transfer comment: cueing for hand placement and anterior weight shift Ambulation/Gait Ambulation/Gait assistance: Min guard Ambulation Distance (Feet): 220 Feet Assistive device: Rolling walker (2 wheeled) Gait Pattern/deviations: Step-through pattern;Decreased stride length Gait velocity interpretation: Below normal speed for age/gender General Gait Details: Pt with good use of RW with cues only needed last 50' for upright posture and position in RW, no rest breaks today    Exercises General Exercises - Lower Extremity Long Arc Quad: AROM;Seated;Both;10 reps Hip Flexion/Marching: AROM;Seated;Both;10 reps Toe Raises: AROM;Seated;Both;10 reps Heel Raises: AROM;Seated;Both;10 reps   PT Diagnosis:    PT Problem List:   PT Treatment Interventions:     PT Goals (current goals can now be found in the care plan section)    Visit Information  Last PT Received On: 05/10/13 Assistance Needed: +1 History of Present Illness: Patient is a 73 year old obese widowed white female from [place] with remote history of rheumatic fever during childhood and long-standing history of chronic diastolic congestive heart failure who has recently experienced multiple recurrent episodes of acute exacerbation of chronic diastolic congestive heart failure.  She has been diagnosed with severe, symptomatic mitral regurgitation with recurrent paroxysmal atrial fibrillation     Subjective Data      Cognition  Cognition Arousal/Alertness: Awake/alert Behavior During Therapy: WFL for tasks assessed/performed Overall Cognitive Status: Within Functional Limits for tasks assessed    Balance     End of Session PT - End of Session Equipment Utilized During Treatment: Oxygen;Gait belt Activity Tolerance: Patient tolerated treatment well Patient left: in chair;with call bell/phone within reach;with family/visitor present Nurse Communication: Mobility status  GP     Lanetta Inch Beth 05/10/2013, 2:09 PM Elwyn Reach, [place]

## 2013-05-10 NOTE — Progress Notes (Signed)
CARDIAC REHAB PHASE I   PRE:  Rate/Rhythm: 63 SR    BP: sitting 129/36    SaO2: 95 1L, 90 RA, 86 RA after walking to door  MODE:  Ambulation: 180 ft   POST:  Rate/Rhythm: 86 SR    BP: sitting 124/42     SaO2: 96 2L  Pt feeling better each day. Steady with RW. Attempted RA however appeared to desat after a few feet. Replaced O2 2L for walk, 1L in room. No major c/o walking. Sts it feels good to be up moving. Return to recliner. [place], [place], ACSM 05/10/2013 3:28 PM

## 2013-05-11 DIAGNOSIS — I509 Heart failure, unspecified: Secondary | ICD-10-CM | POA: Diagnosis not present

## 2013-05-11 DIAGNOSIS — I5033 Acute on chronic diastolic (congestive) heart failure: Secondary | ICD-10-CM | POA: Diagnosis not present

## 2013-05-11 DIAGNOSIS — I4891 Unspecified atrial fibrillation: Secondary | ICD-10-CM | POA: Diagnosis not present

## 2013-05-11 DIAGNOSIS — N19 Unspecified kidney failure: Secondary | ICD-10-CM | POA: Diagnosis not present

## 2013-05-11 LAB — HEPARIN LEVEL (UNFRACTIONATED): Heparin Unfractionated: 0.42 IU/mL (ref 0.30–0.70)

## 2013-05-11 LAB — CBC
HCT: 34.6 % — ABNORMAL LOW (ref 36.0–46.0)
Hemoglobin: 12 g/dL (ref 12.0–15.0)
MCH: 32.3 pg (ref 26.0–34.0)
MCHC: 34.7 g/dL (ref 30.0–36.0)
MCV: 93 fL (ref 78.0–100.0)
Platelets: 227 10*3/uL (ref 150–400)
RBC: 3.72 MIL/uL — AB (ref 3.87–5.11)
RDW: 12.7 % (ref 11.5–15.5)
WBC: 7.8 10*3/uL (ref 4.0–10.5)

## 2013-05-11 LAB — BASIC METABOLIC PANEL
BUN: 46 mg/dL — ABNORMAL HIGH (ref 6–23)
CO2: 34 meq/L — AB (ref 19–32)
CREATININE: 1.64 mg/dL — AB (ref 0.50–1.10)
Calcium: 10 mg/dL (ref 8.4–10.5)
Chloride: 85 mEq/L — ABNORMAL LOW (ref 96–112)
GFR calc Af Amer: 35 mL/min — ABNORMAL LOW (ref >90)
GFR, EST NON AFRICAN AMERICAN: 30 mL/min — AB (ref >90)
GLUCOSE: 108 mg/dL — AB (ref 70–99)
Potassium: 4.3 mEq/L (ref 3.7–5.3)
Sodium: 132 mEq/L — ABNORMAL LOW (ref 137–147)

## 2013-05-11 MED ORDER — AMIODARONE HCL 200 MG PO TABS
400.0000 mg | ORAL_TABLET | Freq: Two times a day (BID) | ORAL | Status: DC
  Administered 2013-05-11 – 2013-05-12 (×4): 400 mg via ORAL
  Filled 2013-05-11 (×6): qty 2

## 2013-05-11 MED ORDER — TORSEMIDE 20 MG PO TABS
40.0000 mg | ORAL_TABLET | Freq: Two times a day (BID) | ORAL | Status: DC
  Administered 2013-05-11 (×2): 40 mg via ORAL
  Filled 2013-05-11 (×5): qty 2

## 2013-05-11 NOTE — Progress Notes (Signed)
[place] for Heparin Indication: atrial fibrillation  Allergies  Allergen Reactions  . Indomethacin Other (See Comments)    dizziness  . Norvasc [Amlodipine Besylate] Cough    Patient Measurements: Height: 5\' 2"  (157.5 cm) Weight: 192 lb 0.3 oz (87.1 kg) IBW/kg (Calculated) : 50.1   Vital Signs: Temp: 97.6 F (36.4 C) (03/06 0825) Temp src: Oral (03/06 0825) BP: 116/46 mmHg (03/06 0825)  Labs:  Recent Labs  05/08/13 1340 05/08/13 1942  05/09/13 0300 05/10/13 0245 05/11/13 0217  HGB  --   --   < > 12.4 12.2 12.0  HCT  --   --   --  35.2* 35.8* 34.6*  PLT  --   --   --  230 253 227  APTT 65* 70*  --  84*  --   --   HEPARINUNFRC 1.14*  --   --  0.79* 0.64 0.42  CREATININE  --   --   --  1.94* 2.06* 1.64*  < > = values in this interval not displayed.  Estimated Creatinine Clearance: 31.8 ml/min (by C-G formula based on Cr of 1.64).   Medications:  Scheduled:  . amiodarone  400 mg Oral BID  . darifenacin  7.5 mg Oral Daily  . simvastatin  10 mg Oral q1800  . sodium chloride  3 mL Intravenous Q12H  . torsemide  40 mg Oral BID   . sodium chloride Stopped (05/04/13 2100)  . heparin 950 Units/hr (05/10/13 1929)    Assessment: 73 yo F initially started on apixiban on 2/23 for afib, however patient had to go into cath on 2/27 and was restarted post cath on heparin. Plan for MV surgery with possible MAZE procedure next week, will continue IV heparin for now.  Heparin level now at goal after apixaban washout, apixaban (last dose 2/26). No bleeding or complications noted.  Goal of Therapy:  Heparin level 0.3-0.7 Monitor platelets by anticoagulation protocol: Yes   Plan:  Continue IV heparin at 950 units/hr. Daily HL  Monitor for signs of bleeding  Erin Hearing PharmD., BCPS Clinical Pharmacist Pager [phone] 05/11/2013 9:13 AM

## 2013-05-11 NOTE — Progress Notes (Signed)
Pt transferred to 3E. CSW provided handoff to unit CSW.   Abdallah Hern, MSW, LCSWA Clinical Social Worker [phone] 

## 2013-05-11 NOTE — Progress Notes (Signed)
CARDIAC REHAB PHASE I   PRE:  Rate/Rhythm: 62 SR    BP: sitting 120/42    SaO2: 98 1L  MODE:  Ambulation: 310 ft   POST:  Rate/Rhythm: 76 SR    BP: sitting 121/47     SaO2: 97 2L  Pt incontinent of urine in chair. To BSC for BM as well. Pt able to stand independently, just slow due to knee arthritis. Able to walk increased distance pushing w/c and on 2L. Fairly steady. Rest x2 standing, short duration. Maintained SR. Return to recliner.  [phone] Josephina Shih [place] CES, ACSM 05/11/2013 2:39 PM

## 2013-05-11 NOTE — Progress Notes (Signed)
Physical Therapy Treatment Patient Details Name: [name] [name] MRN: [id number] DOB: [birth year] Today's Date: 05/11/2013 Time: 2841-3244 PT Time Calculation (min): 26 min  PT Assessment / Plan / Recommendation  History of Present Illness Patient is a 73 year old obese widowed white female from [place] with remote history of rheumatic fever during childhood and long-standing history of chronic diastolic congestive heart failure who has recently experienced multiple recurrent episodes of acute exacerbation of chronic diastolic congestive heart failure.  She has been diagnosed with severe, symptomatic mitral regurgitation with recurrent paroxysmal atrial fibrillation    PT Comments   Pt with excellent progress today and encouraged to continue mobility over the weekend. Pt anxious to return to all her prior activities and maintaining sats 89-95% on 1L throughout activity with HR 64-83 NSR. Will continue to follow and will assess status post -op as well as DC plan.   Follow Up Recommendations        Does the patient have the potential to tolerate intense rehabilitation     Barriers to Discharge        Equipment Recommendations  Rolling walker with 5" wheels    Recommendations for Other Services    Frequency     Progress towards PT Goals Progress towards PT goals: Progressing toward goals  Plan Current plan remains appropriate    Precautions / Restrictions Precautions Precautions: Fall   Pertinent Vitals/Pain No pain    Mobility  Bed Mobility Overal bed mobility: Modified Independent Bed Mobility: Supine to Sit Supine to sit: Modified independent (Device/Increase time) General bed mobility comments: pt able to transfer to EOB with use of rail and HOB 20degrees without cueing or assist today Transfers Overall transfer level: Needs assistance Transfers: Sit to/from Stand;Stand Pivot Transfers Sit to Stand: Supervision Stand pivot transfers: Supervision General transfer comment:  cueing for hand placement . pivot bed to [facility] and chair to [facility] end of session Ambulation/Gait Ambulation/Gait assistance: Supervision Ambulation Distance (Feet): 330 Feet Assistive device: Rolling walker (2 wheeled) Gait Pattern/deviations: Step-through pattern;Decreased stride length Gait velocity interpretation: <1.8 ft/sec, indicative of risk for recurrent falls General Gait Details: Pt with good use of RW with cues for posture without rest breaks again today    Exercises General Exercises - Lower Extremity Long Arc Quad: AROM;Seated;Both;20 reps Hip Flexion/Marching: AROM;Seated;Both;20 reps   PT Diagnosis:    PT Problem List:   PT Treatment Interventions:     PT Goals (current goals can now be found in the care plan section)    Visit Information  Last PT Received On: 05/11/13 Assistance Needed: +1 History of Present Illness: Patient is a 73 year old obese widowed white female from [place] with remote history of rheumatic fever during childhood and long-standing history of chronic diastolic congestive heart failure who has recently experienced multiple recurrent episodes of acute exacerbation of chronic diastolic congestive heart failure.  She has been diagnosed with severe, symptomatic mitral regurgitation with recurrent paroxysmal atrial fibrillation     Subjective Data      Cognition  Cognition Arousal/Alertness: Awake/alert Behavior During Therapy: WFL for tasks assessed/performed Overall Cognitive Status: Within Functional Limits for tasks assessed    Balance     End of Session PT - End of Session Equipment Utilized During Treatment: Oxygen;Gait belt Activity Tolerance: Patient tolerated treatment well Patient left: in chair;with call bell/phone within reach;with family/visitor present   GP     [name] [name] Beth 05/11/2013, 9:07 AM Elwyn Reach, [place]

## 2013-05-11 NOTE — Progress Notes (Signed)
1500-1900 Pt.is A/O4 and is a transfer from 2 H. She was transported by wheelchair with continuous heparin infusion and oxygen at 2 L [place]./ She had no c/o pain and no signs of distress. She is 1 person assist.

## 2013-05-11 NOTE — Progress Notes (Signed)
[name], female   DOB: 1940-05-12, 73 y.o.   MRN: [id number]   SUBJECTIVE: Stable breathing.  Walking with PT, doing better but still desaturates when they try to take off oxygen while walking.  Creatinine coming down.  She is in NSR this morning.  Scheduled Meds: . darifenacin  7.5 mg Oral Daily  . simvastatin  10 mg Oral q1800  . sodium chloride  3 mL Intravenous Q12H  . torsemide  40 mg Oral BID   Continuous Infusions: . sodium chloride Stopped (05/04/13 2100)  . amiodarone (NEXTERONE PREMIX) 360 mg/200 mL dextrose 30 mg/hr (05/11/13 0236)  . heparin 950 Units/hr (05/10/13 1929)   PRN Meds:.sodium chloride, acetaminophen, albuterol, alum & mag hydroxide-simeth, HYDROcodone-acetaminophen, ipratropium-albuterol, LORazepam, morphine injection, sodium chloride    Filed Vitals:   05/10/13 2300 05/11/13 0008 05/11/13 0417 05/11/13 0530  BP:  117/80  117/51  Pulse:      Temp: 97.4 F (36.3 C)  97.5 F (36.4 C)   TempSrc: Oral  Oral   Resp:  17  17  Height:      Weight:    87.1 kg (192 lb 0.3 oz)  SpO2:  91%  94%    Intake/Output Summary (Last 24 hours) at 05/11/13 0737 Last data filed at 05/11/13 0500  Gross per 24 hour  Intake 1188.8 ml  Output    200 ml  Net  988.8 ml    LABS: Basic Metabolic Panel:  Recent Labs  05/10/13 0245 05/11/13 0217  NA 134* 132*  K 3.9 4.3  CL 85* 85*  CO2 36* 34*  GLUCOSE 122* 108*  BUN 56* 46*  CREATININE 2.06* 1.64*  CALCIUM 9.7 10.0   Liver Function Tests: No results found for this basename: AST, ALT, ALKPHOS, BILITOT, PROT, ALBUMIN,  in the last 72 hours No results found for this basename: LIPASE, AMYLASE,  in the last 72 hours CBC:  Recent Labs  05/10/13 0245 05/11/13 0217  WBC 7.9 7.8  HGB 12.2 12.0  HCT 35.8* 34.6*  MCV 93.5 93.0  PLT 253 227   Cardiac Enzymes: No results found for this basename: CKTOTAL, CKMB, CKMBINDEX, TROPONINI,  in the last 72 hours BNP: No components found with  this basename: POCBNP,  D-Dimer: No results found for this basename: DDIMER,  in the last 72 hours Hemoglobin A1C: No results found for this basename: HGBA1C,  in the last 72 hours Fasting Lipid Panel: No results found for this basename: CHOL, HDL, LDLCALC, TRIG, CHOLHDL, LDLDIRECT,  in the last 72 hours Thyroid Function Tests: No results found for this basename: TSH, T4TOTAL, FREET3, T3FREE, THYROIDAB,  in the last 72 hours Anemia Panel: No results found for this basename: VITAMINB12, FOLATE, FERRITIN, TIBC, IRON, RETICCTPCT,  in the last 72 hours   PHYSICAL EXAM General: NAD, frail Neck: JVP 8 cm, no thyromegaly or thyroid nodule.  Lungs: Dependent crackles CV: Nondisplaced PMI.  Heart regular S1/S2, no S3/S4, 2/6 HSM apex.  Trace ankle edema.  No carotid bruit.  Normal pedal pulses.  Abdomen: Soft, nontender, no hepatosplenomegaly, no distention.  Neurologic: sleeping currently Extremities: No clubbing or cyanosis.   TELEMETRY: Reviewed telemetry pt in NSR, HR 60s  ASSESSMENT AND PLAN: 73 yo was transferred to [facility] for evaluation of mitral regurgitation for potential repair.  She has acute on chronic diastolic CHF and paroxysmal atrial fibrillation.   1. Mitral regurgitation: Suspect severe MR.  2 moderate jets on TEE, additively severe.  Large V-waves on RHC.  Evaluated by CVTS with plan for MV repair and Maze, scheduled next Wednesday.  She needs tuning up prior to this given CHF.  She does look frail but says she is independent in her ADLs at home. She needs aggressive PT work, bid walking.  2. Acute on chronic diastolic CHF: Patient is volume overloaded on exam and by RHC.  She was noted to have severe pulmonary HTN on RHC, but PVR only 3.3 WU, suggesting that this was mostly pulmonary venous hypertension.  She diuresed well with Lasix gtt with improvement in breathing.  Volume status is better.  Still suspect she has some volume overload given hypoxia with exercise.  Creatinine rose  after getting contrast for CT and diuretic held yesterday.  Creatinine now coming down, will restart torsemide 40 mg po bid. 3. Atrial fibrillation: Paroxysmal.  Patient seems to be staying more in NSR now.  She can switch over to po amiodarone today. She is on heparin gtt.  Eventually want to get her back on Eliquis, likely after MV surgery.  Will need Maze as well.   4. Renal dysfunction:  Possibly contrast nephropathy in setting diuresis.  Creatinine trending down, can restart torsemide as above.  5. She will remain an inpatient until surgery, continue aggressive PT to prepare her.     [name] 05/11/2013 7:37 AM

## 2013-05-12 LAB — BASIC METABOLIC PANEL
BUN: 46 mg/dL — ABNORMAL HIGH (ref 6–23)
CHLORIDE: 88 meq/L — AB (ref 96–112)
CO2: 33 mEq/L — ABNORMAL HIGH (ref 19–32)
Calcium: 9.9 mg/dL (ref 8.4–10.5)
Creatinine, Ser: 1.79 mg/dL — ABNORMAL HIGH (ref 0.50–1.10)
GFR calc Af Amer: 31 mL/min — ABNORMAL LOW (ref >90)
GFR calc non Af Amer: 27 mL/min — ABNORMAL LOW (ref >90)
GLUCOSE: 128 mg/dL — AB (ref 70–99)
Potassium: 4 mEq/L (ref 3.7–5.3)
SODIUM: 135 meq/L — AB (ref 137–147)

## 2013-05-12 LAB — CBC
HCT: 35.3 % — ABNORMAL LOW (ref 36.0–46.0)
Hemoglobin: 12.1 g/dL (ref 12.0–15.0)
MCH: 32 pg (ref 26.0–34.0)
MCHC: 34.3 g/dL (ref 30.0–36.0)
MCV: 93.4 fL (ref 78.0–100.0)
PLATELETS: 236 10*3/uL (ref 150–400)
RBC: 3.78 MIL/uL — AB (ref 3.87–5.11)
RDW: 13 % (ref 11.5–15.5)
WBC: 6.8 10*3/uL (ref 4.0–10.5)

## 2013-05-12 LAB — HEPARIN LEVEL (UNFRACTIONATED): Heparin Unfractionated: 0.55 IU/mL (ref 0.30–0.70)

## 2013-05-12 MED ORDER — TORSEMIDE 20 MG PO TABS
40.0000 mg | ORAL_TABLET | Freq: Every day | ORAL | Status: DC
Start: 2013-05-13 — End: 2013-05-13
  Filled 2013-05-12: qty 2

## 2013-05-12 NOTE — Progress Notes (Signed)
CARDIAC REHAB PHASE I   PRE:  Rate/Rhythm: 35 SB  BP:  Supine:   Sitting: 120/70  Standing:    SaO2: 96%1L  MODE:  Ambulation: 400 ft   POST:  Rate/Rhythm: 62  BP:  Supine:   Sitting: 120/72  Standing:    SaO2: 97%2L 1315-1410 Pt incontinent of urine getting to BSC. Helped pt get cleaned up. Walked 400 ft on 2L pushing wheelchair and with asst x 1. Did not have oxygen carrier so had to use wheelchair with carrier attached. Tolerated increase in distance well. Stopped several times to rest. To recliner after walk. Encouraged pt to walk with staff later.   Graylon Good, RN BSN  05/12/2013 2:04 PM

## 2013-05-12 NOTE — Progress Notes (Signed)
[name], female   DOB: [birth year], 73 y.o.   MRN: [id number]   SUBJECTIVE: Stable breathing.  Walking with PT, doing better but still desaturates when they try to take off oxygen while walking.  Creatinine coming down.  She is in NSR this morning.  Scheduled Meds: . amiodarone  400 mg Oral BID  . darifenacin  7.5 mg Oral Daily  . simvastatin  10 mg Oral q1800  . sodium chloride  3 mL Intravenous Q12H  . torsemide  40 mg Oral BID   Continuous Infusions: . sodium chloride Stopped (05/04/13 2100)  . heparin 950 Units/hr (05/11/13 2149)   PRN Meds:.sodium chloride, acetaminophen, albuterol, alum & mag hydroxide-simeth, HYDROcodone-acetaminophen, ipratropium-albuterol, LORazepam, morphine injection, sodium chloride    Filed Vitals:   05/11/13 2004 05/12/13 0140 05/12/13 0615 05/12/13 0715  BP: 108/40 114/42 93/46   Pulse: 62 62 65   Temp: 98.2 F (36.8 C) 97.9 F (36.6 C) 98 F (36.7 C)   TempSrc: Oral Oral Oral   Resp: 18 20 18    Height:      Weight:    185 lb 12.8 oz (84.278 kg)  SpO2: 96% 95% 95%     Intake/Output Summary (Last 24 hours) at 05/12/13 0811 Last data filed at 05/12/13 0716  Gross per 24 hour  Intake   1194 ml  Output   1375 ml  Net   -181 ml    LABS: Basic Metabolic Panel:  Recent Labs  05/11/13 0217 05/12/13 0555  NA 132* 135*  K 4.3 4.0  CL 85* 88*  CO2 34* 33*  GLUCOSE 108* 128*  BUN 46* 46*  CREATININE 1.64* 1.79*  CALCIUM 10.0 9.9   CBC:  Recent Labs  05/11/13 0217 05/12/13 0555  WBC 7.8 6.8  HGB 12.0 12.1  HCT 34.6* 35.3*  MCV 93.0 93.4  PLT 227 236     PHYSICAL EXAM General: NAD, frail Neck: JVP 8 cm, no thyromegaly or thyroid nodule.  Lungs: Dependent crackles CV: Nondisplaced PMI.  Heart regular S1/S2, no S3/S4, 2/6 HSM apex.  Trace ankle edema.  No carotid bruit.  Normal pedal pulses.  Abdomen: Soft, nontender, no hepatosplenomegaly, no distention.  Neurologic: sleeping currently Extremities:  No clubbing or cyanosis.  Mild edema and erythema   TELEMETRY: Reviewed telemetry pt in NSR, HR 60s  ASSESSMENT AND PLAN: 73 yo was transferred to [facility] for evaluation of mitral regurgitation for potential repair.  She has acute on chronic diastolic CHF and paroxysmal atrial fibrillation.   1. Mitral regurgitation: Suspect severe MR.  2 moderate jets on TEE, additively severe.  Large V-waves on RHC.  Evaluated by CVTS with plan for MV repair and Maze, scheduled next Wednesday.  She needs tuning up prior to this given CHF.  She does look frail but says she is independent in her ADLs at home. She needs aggressive PT work, bid walking.  2. Acute on chronic diastolic CHF: Patient is volume overloaded on exam and by RHC.  She was noted to have severe pulmonary HTN on RHC, but PVR only 3.3 WU, suggesting that this was mostly pulmonary venous hypertension.  She diuresed well with Lasix gtt with improvement in breathing.  Volume status is better.  Still suspect she has some volume overload given hypoxia with exercise.  Creatinine rose after getting contrast for CT and diuretic held yesterday.  Will decrease demedex to daily dosing and see what Cr does 3.  Afib:  maint NSR on oral amiodarone  Will have MAZE with MV procedure    Surgery Wednesday      Jenkins Rouge 05/12/2013 8:11 AM

## 2013-05-12 NOTE — Progress Notes (Signed)
      [address]       [place] [address]             [phone]     CARDIOTHORACIC SURGERY PROGRESS NOTE  8 Days Post-Op  S/P Procedure(s) (LRB): LEFT AND RIGHT HEART CATHETERIZATION WITH CORONARY ANGIOGRAM (N/A)  Subjective: Dyspnea with ambulation but overall slowly improving  Objective: Vital signs in last 24 hours: Temp:  [97.4 F (36.3 C)-98.2 F (36.8 C)] 97.4 F (36.3 C) (03/07 1034) Pulse Rate:  [61-65] 61 (03/07 1034) Cardiac Rhythm:  [-] Sinus bradycardia (03/06 2237) Resp:  [18-20] 20 (03/07 1034) BP: (93-119)/(40-49) 112/49 mmHg (03/07 1034) SpO2:  [95 %-99 %] 96 % (03/07 1034) Weight:  [84.278 kg (185 lb 12.8 oz)-85.3 kg (188 lb 0.8 oz)] 84.278 kg (185 lb 12.8 oz) (03/07 0715)  Physical Exam:  Rhythm:   sinus  Breath sounds: clear  Heart sounds:  RRR w/ systolic murmur  Incisions:  n/a  Abdomen:  soft  Extremities:  warm   Intake/Output from previous day: 03/06 0701 - 03/07 0700 In: 1194 [P.O.:1080; I.V.:114] Out: 1275 [Urine:1275] Intake/Output this shift: Total I/O In: 480 [P.O.:480] Out: 100 [Urine:100]  Lab Results:  Recent Labs  05/11/13 0217 05/12/13 0555  WBC 7.8 6.8  HGB 12.0 12.1  HCT 34.6* 35.3*  PLT 227 236   BMET:  Recent Labs  05/11/13 0217 05/12/13 0555  NA 132* 135*  K 4.3 4.0  CL 85* 88*  CO2 34* 33*  GLUCOSE 108* 128*  BUN 46* 46*  CREATININE 1.64* 1.79*  CALCIUM 10.0 9.9    CBG (last 3)  No results found for this basename: GLUCAP,  in the last 72 hours PT/INR:  No results found for this basename: LABPROT, INR,  in the last 72 hours  CXR:  N/A  Assessment/Plan: S/P Procedure(s) (LRB): LEFT AND RIGHT HEART CATHETERIZATION WITH CORONARY ANGIOGRAM (N/A)  Slowly improving Renal function returning towards baseline Tentatively for mitral repair/replacement + maze on Wednesday 3/11  OWEN,CLARENCE H 05/12/2013 2:51 PM

## 2013-05-12 NOTE — Progress Notes (Signed)
[place] for Heparin Indication: atrial fibrillation  Allergies  Allergen Reactions  . Indomethacin Other (See Comments)    dizziness  . Norvasc [Amlodipine Besylate] Cough    Patient Measurements: Height: 5\' 2"  (157.5 cm) Weight: 188 lb 0.8 oz (85.3 kg) (scale C) IBW/kg (Calculated) : 50.1 Heparin Dosing Weight: 69.4 kg  Vital Signs: Temp: 98 F (36.7 C) (03/07 0615) Temp src: Oral (03/07 0615) BP: 93/46 mmHg (03/07 0615) Pulse Rate: 65 (03/07 0615)  Labs:  Recent Labs  05/10/13 0245 05/11/13 0217 05/12/13 0555  HGB 12.2 12.0 12.1  HCT 35.8* 34.6* 35.3*  PLT 253 227 236  HEPARINUNFRC 0.64 0.42 0.55  CREATININE 2.06* 1.64* 1.79*    Estimated Creatinine Clearance: 28.8 ml/min (by C-G formula based on Cr of 1.79).   Medications:  Scheduled:  . amiodarone  400 mg Oral BID  . darifenacin  7.5 mg Oral Daily  . simvastatin  10 mg Oral q1800  . sodium chloride  3 mL Intravenous Q12H  . torsemide  40 mg Oral BID   . sodium chloride Stopped (05/04/13 2100)  . heparin 950 Units/hr (05/11/13 2149)    Assessment: 73 yo F initially started on apixiban on 2/23 for afib, however patient had to go into cath on 2/27 and was restarted post cath on heparin. Apixaban last dose 2/26.  Plan for MV surgery with possible MAZE procedure next week, will continue IV heparin for now.    Heparin level remains at goal after apixaban washout (0.55 this AM). H/H is stable and PLT wnl.  SCr trending up to 1.79 with estimated CrCl ~29.  No bleeding or complications noted.  Goal of Therapy:  Heparin level 0.3-0.7 Monitor platelets by anticoagulation protocol: Yes   Plan:  - continue IV heparin gtt at 950 units/hr. - daily HL and CBC - monitor for s/s of bleeding  Ovid Curd E. [name], PharmD Clinical Pharmacist - Resident Pager: [phone] Pharmacy: [phone] 05/12/2013 7:14 AM

## 2013-05-13 ENCOUNTER — Inpatient Hospital Stay (HOSPITAL_COMMUNITY): Payer: Medicare HMO

## 2013-05-13 LAB — CBC
HCT: 35 % — ABNORMAL LOW (ref 36.0–46.0)
HEMOGLOBIN: 11.9 g/dL — AB (ref 12.0–15.0)
MCH: 32.2 pg (ref 26.0–34.0)
MCHC: 34 g/dL (ref 30.0–36.0)
MCV: 94.9 fL (ref 78.0–100.0)
Platelets: 240 10*3/uL (ref 150–400)
RBC: 3.69 MIL/uL — ABNORMAL LOW (ref 3.87–5.11)
RDW: 13.2 % (ref 11.5–15.5)
WBC: 7.5 10*3/uL (ref 4.0–10.5)

## 2013-05-13 LAB — HEPARIN LEVEL (UNFRACTIONATED): HEPARIN UNFRACTIONATED: 0.41 [IU]/mL (ref 0.30–0.70)

## 2013-05-13 MED ORDER — PREDNISONE 20 MG PO TABS
20.0000 mg | ORAL_TABLET | Freq: Once | ORAL | Status: AC
  Administered 2013-05-13: 20 mg via ORAL
  Filled 2013-05-13: qty 1

## 2013-05-13 MED ORDER — ATORVASTATIN CALCIUM 10 MG PO TABS
10.0000 mg | ORAL_TABLET | Freq: Every day | ORAL | Status: DC
  Administered 2013-05-13 – 2013-05-15 (×3): 10 mg via ORAL
  Filled 2013-05-13 (×5): qty 1

## 2013-05-13 MED ORDER — NITROGLYCERIN 0.4 MG SL SUBL
0.4000 mg | SUBLINGUAL_TABLET | SUBLINGUAL | Status: DC | PRN
  Administered 2013-05-13: 0.4 mg via SUBLINGUAL
  Filled 2013-05-13: qty 1

## 2013-05-13 MED ORDER — PANTOPRAZOLE SODIUM 40 MG PO TBEC
40.0000 mg | DELAYED_RELEASE_TABLET | Freq: Every day | ORAL | Status: DC
  Administered 2013-05-13 – 2013-05-16 (×4): 40 mg via ORAL
  Filled 2013-05-13 (×4): qty 1

## 2013-05-13 MED ORDER — FUROSEMIDE 40 MG PO TABS
40.0000 mg | ORAL_TABLET | Freq: Every day | ORAL | Status: DC
  Administered 2013-05-13: 40 mg via ORAL
  Filled 2013-05-13 (×2): qty 1

## 2013-05-13 MED ORDER — CAMPHOR-MENTHOL 0.5-0.5 % EX LOTN
TOPICAL_LOTION | CUTANEOUS | Status: DC | PRN
  Administered 2013-05-13: 04:00:00 via TOPICAL
  Filled 2013-05-13: qty 222

## 2013-05-13 MED ORDER — PREDNISONE 20 MG PO TABS
40.0000 mg | ORAL_TABLET | Freq: Once | ORAL | Status: AC
  Administered 2013-05-13: 40 mg via ORAL
  Filled 2013-05-13: qty 2

## 2013-05-13 MED ORDER — DIPHENHYDRAMINE HCL 25 MG PO CAPS
25.0000 mg | ORAL_CAPSULE | Freq: Three times a day (TID) | ORAL | Status: DC | PRN
  Administered 2013-05-13 – 2013-05-14 (×5): 25 mg via ORAL
  Filled 2013-05-13 (×5): qty 1

## 2013-05-13 NOTE — Progress Notes (Signed)
[name], female   DOB: [birth year], 73 y.o.   MRN: [id number]   SUBJECTIVE: Itching all over rash   Scheduled Meds: . atorvastatin  10 mg Oral q1800  . darifenacin  7.5 mg Oral Daily  . furosemide  40 mg Oral Daily  . predniSONE  40 mg Oral Once  . sodium chloride  3 mL Intravenous Q12H   Continuous Infusions: . sodium chloride Stopped (05/04/13 2100)  . heparin 950 Units/hr (05/12/13 2002)   PRN Meds:.sodium chloride, acetaminophen, albuterol, alum & mag hydroxide-simeth, camphor-menthol, diphenhydrAMINE, HYDROcodone-acetaminophen, ipratropium-albuterol, LORazepam, morphine injection, sodium chloride    Filed Vitals:   05/12/13 1034 05/12/13 1458 05/12/13 2121 05/13/13 0635  BP: 112/49 108/57 101/38 112/34  Pulse: 61 57 55 62  Temp: 97.4 F (36.3 C) 97.9 F (36.6 C) 98.1 F (36.7 C) 97.9 F (36.6 C)  TempSrc: Oral Oral Oral Oral  Resp: 20 20 18 18   Height:      Weight:    187 lb 11.2 oz (85.14 kg)  SpO2: 96% 96% 92% 95%    Intake/Output Summary (Last 24 hours) at 05/13/13 0824 Last data filed at 05/13/13 0600  Gross per 24 hour  Intake    948 ml  Output   1100 ml  Net   -152 ml    LABS: Basic Metabolic Panel:  Recent Labs  05/11/13 0217 05/12/13 0555  NA 132* 135*  K 4.3 4.0  CL 85* 88*  CO2 34* 33*  GLUCOSE 108* 128*  BUN 46* 46*  CREATININE 1.64* 1.79*  CALCIUM 10.0 9.9   CBC:  Recent Labs  05/12/13 0555 05/13/13 0501  WBC 6.8 7.5  HGB 12.1 11.9*  HCT 35.3* 35.0*  MCV 93.4 94.9  PLT 236 240     PHYSICAL EXAM General: NAD, frail Neck: JVP 8 cm, no thyromegaly or thyroid nodule.  Lungs: Dependent crackles CV: Nondisplaced PMI.  Heart regular S1/S2, no S3/S4, 2/6 HSM apex.  Trace ankle edema.  No carotid bruit.  Normal pedal pulses.  Abdomen: Soft, nontender, no hepatosplenomegaly, no distention.  Neurologic: sleeping currently Extremities: No clubbing or cyanosis.  Mild edema and erythema  Skin:  eryth rash on  chest and back no papules   TELEMETRY: Reviewed telemetry pt in NSR, HR 60s  ASSESSMENT AND PLAN: 73 yo was transferred to [facility] for evaluation of mitral regurgitation for potential repair.  She has acute on chronic diastolic CHF and paroxysmal atrial fibrillation.   1. Mitral regurgitation: Suspect severe MR.  2 moderate jets on TEE, additively severe.  Large V-waves on RHC.  Evaluated by CVTS with plan for MV repair and Maze, scheduled next Wednesday.  She needs tuning up prior to this given CHF.  She does look frail but says she is independent in her ADLs at home. She needs aggressive PT work, bid walking.  2. Acute on chronic diastolic CHF: Patient is volume overloaded on exam and by RHC.  She was noted to have severe pulmonary HTN on RHC, but PVR only 3.3 WU, suggesting that this was mostly pulmonary venous hypertension.  She diuresed well with Lasix gtt with improvement in breathing.  Volume status is better.  Still suspect she has some volume overload given hypoxia with exercise.  Creatinine rose after getting contrast for CT and diuretic held yesterday.  Will decrease demedex to daily dosing and see what Cr does 3.  Afib:  maint NSR  Will have MAZE with MV procedure    Surgery Wednesday  Have stopped amiodarone and substituded lasix for demedex and lipitor for statin  Will give 40 mg prednisone today to help rash resolve quicker  Consider another dose in am      Jenkins Rouge 05/13/2013 8:24 AM

## 2013-05-13 NOTE — Progress Notes (Signed)
Patient experiencing tightness in lower medial chest, rating 3/10.  EKG obtained and in chart, cardiology notified, PRN sublingual nitro ordered and administered x1.  Vital signs stable pre- and post-intervention.  Post intervention, patient rated pain 1/10.  Will continue to monitor.

## 2013-05-13 NOTE — Progress Notes (Signed)
RN reported the patient had chest pain.  NTG x1 appeared to help.  Recent coronary cath revealed no significant CAD.  EKG with slight TWI in I, aVL.    Will add protonix.    Ethlyn Alto PA-C

## 2013-05-13 NOTE — Progress Notes (Signed)
Pt c/o generalized itching, noted to have rashes on chest, upper back, bilateral upper extremities and upper thigh. Dr. Percival Spanish notified and ordered benadryl 25mg  PO and camphor lotion for itching, orders carried out. Will continue to monitor.

## 2013-05-13 NOTE — Progress Notes (Signed)
[place] for Heparin Indication: atrial fibrillation  Allergies  Allergen Reactions  . Indomethacin Other (See Comments)    dizziness  . Norvasc [Amlodipine Besylate] Cough    Patient Measurements: Height: 5\' 2"  (157.5 cm) Weight: 187 lb 11.2 oz (85.14 kg) IBW/kg (Calculated) : 50.1 Heparin Dosing Weight: 69.4 kg  Vital Signs: Temp: 97.9 F (36.6 C) (03/08 0635) Temp src: Oral (03/08 0635) BP: 112/34 mmHg (03/08 0635) Pulse Rate: 62 (03/08 0635)  Labs:  Recent Labs  05/11/13 0217 05/12/13 0555 05/13/13 0501  HGB 12.0 12.1 11.9*  HCT 34.6* 35.3* 35.0*  PLT 227 236 240  HEPARINUNFRC 0.42 0.55 0.41  CREATININE 1.64* 1.79*  --     Estimated Creatinine Clearance: 28.7 ml/min (by C-G formula based on Cr of 1.79).   Medications:  Scheduled:  . amiodarone  400 mg Oral BID  . darifenacin  7.5 mg Oral Daily  . simvastatin  10 mg Oral q1800  . sodium chloride  3 mL Intravenous Q12H  . torsemide  40 mg Oral Daily   . sodium chloride Stopped (05/04/13 2100)  . heparin 950 Units/hr (05/12/13 2002)    Assessment: 73 yo F initially started on apixiban on 2/23 for afib, however patient had to go into cath on 2/27 and was restarted post cath on heparin. Apixaban last dose 2/26.  Plan for MV surgery with possible MAZE procedure next week (tentatively planned for Wednesday 3/11), will continue IV heparin for now.    Heparin level remains at goal after apixaban washout (0.41 this AM). H/H are low but stable and PLT wnl.  Last SCr (3/7) trending up to 1.79 with estimated CrCl ~29.  No bleeding or complications noted.  Goal of Therapy:  Heparin level 0.3-0.7 Monitor platelets by anticoagulation protocol: Yes   Plan:  - continue IV heparin gtt at 950 units/hr. - daily HL and CBC - monitor for s/s of bleeding  Ovid Curd E. [name], PharmD Clinical Pharmacist - Resident Pager: [phone] Pharmacy: [phone] 05/13/2013 7:09 AM

## 2013-05-14 DIAGNOSIS — I509 Heart failure, unspecified: Secondary | ICD-10-CM | POA: Diagnosis not present

## 2013-05-14 DIAGNOSIS — N189 Chronic kidney disease, unspecified: Secondary | ICD-10-CM | POA: Diagnosis not present

## 2013-05-14 DIAGNOSIS — I059 Rheumatic mitral valve disease, unspecified: Secondary | ICD-10-CM

## 2013-05-14 DIAGNOSIS — I4891 Unspecified atrial fibrillation: Secondary | ICD-10-CM | POA: Diagnosis not present

## 2013-05-14 LAB — CBC
HEMATOCRIT: 35.9 % — AB (ref 36.0–46.0)
HEMOGLOBIN: 12.2 g/dL (ref 12.0–15.0)
MCH: 31.9 pg (ref 26.0–34.0)
MCHC: 34 g/dL (ref 30.0–36.0)
MCV: 93.7 fL (ref 78.0–100.0)
Platelets: 264 10*3/uL (ref 150–400)
RBC: 3.83 MIL/uL — ABNORMAL LOW (ref 3.87–5.11)
RDW: 12.7 % (ref 11.5–15.5)
WBC: 9.3 10*3/uL (ref 4.0–10.5)

## 2013-05-14 LAB — COMPREHENSIVE METABOLIC PANEL
ALBUMIN: 3.2 g/dL — AB (ref 3.5–5.2)
ALT: 21 U/L (ref 0–35)
AST: 21 U/L (ref 0–37)
Alkaline Phosphatase: 95 U/L (ref 39–117)
BILIRUBIN TOTAL: 0.3 mg/dL (ref 0.3–1.2)
BUN: 35 mg/dL — AB (ref 6–23)
CHLORIDE: 91 meq/L — AB (ref 96–112)
CO2: 29 meq/L (ref 19–32)
Calcium: 10 mg/dL (ref 8.4–10.5)
Creatinine, Ser: 1.45 mg/dL — ABNORMAL HIGH (ref 0.50–1.10)
GFR calc Af Amer: 41 mL/min — ABNORMAL LOW (ref >90)
GFR, EST NON AFRICAN AMERICAN: 35 mL/min — AB (ref >90)
Glucose, Bld: 230 mg/dL — ABNORMAL HIGH (ref 70–99)
POTASSIUM: 4.2 meq/L (ref 3.7–5.3)
Sodium: 134 mEq/L — ABNORMAL LOW (ref 137–147)
Total Protein: 7 g/dL (ref 6.0–8.3)

## 2013-05-14 LAB — HEPARIN LEVEL (UNFRACTIONATED)
HEPARIN UNFRACTIONATED: 0.6 [IU]/mL (ref 0.30–0.70)
Heparin Unfractionated: 0.81 IU/mL — ABNORMAL HIGH (ref 0.30–0.70)

## 2013-05-14 LAB — CLOSTRIDIUM DIFFICILE BY PCR: Toxigenic C. Difficile by PCR: NEGATIVE

## 2013-05-14 MED ORDER — CHLORHEXIDINE GLUCONATE 4 % EX LIQD
60.0000 mL | Freq: Once | CUTANEOUS | Status: AC
  Administered 2013-05-16: 4 via TOPICAL
  Filled 2013-05-14: qty 60

## 2013-05-14 MED ORDER — FUROSEMIDE 40 MG PO TABS
60.0000 mg | ORAL_TABLET | Freq: Two times a day (BID) | ORAL | Status: DC
  Administered 2013-05-14 – 2013-05-15 (×4): 60 mg via ORAL
  Filled 2013-05-14 (×8): qty 1

## 2013-05-14 MED ORDER — AMIODARONE HCL 200 MG PO TABS
200.0000 mg | ORAL_TABLET | Freq: Two times a day (BID) | ORAL | Status: DC
  Administered 2013-05-14 – 2013-05-16 (×4): 200 mg via ORAL
  Filled 2013-05-14 (×7): qty 1

## 2013-05-14 MED ORDER — CHLORHEXIDINE GLUCONATE 4 % EX LIQD
60.0000 mL | Freq: Once | CUTANEOUS | Status: AC
  Administered 2013-05-15: 4 via TOPICAL
  Filled 2013-05-14 (×2): qty 60

## 2013-05-14 NOTE — Progress Notes (Signed)
[name], female   DOB: 03/16/1940, 73 y.o.   MRN: [id number]    SUBJECTIVE: She remains in NSR.  Breathing much better overall but still on oxygen.  Rash resolved.   Scheduled Meds: . amiodarone  200 mg Oral BID  . atorvastatin  10 mg Oral q1800  . darifenacin  7.5 mg Oral Daily  . furosemide  60 mg Oral BID  . pantoprazole  40 mg Oral Q0600  . sodium chloride  3 mL Intravenous Q12H   Continuous Infusions: . sodium chloride Stopped (05/04/13 2100)  . heparin 850 Units/hr (05/14/13 0741)   PRN Meds:.sodium chloride, acetaminophen, albuterol, alum & mag hydroxide-simeth, camphor-menthol, diphenhydrAMINE, HYDROcodone-acetaminophen, ipratropium-albuterol, LORazepam, morphine injection, nitroGLYCERIN, sodium chloride    Filed Vitals:   05/13/13 1418 05/13/13 2130 05/14/13 0544 05/14/13 0546  BP: 107/51 110/52  129/59  Pulse: 61 61  64  Temp: 98.1 F (36.7 C) 98 F (36.7 C)  97.4 F (36.3 C)  TempSrc: Oral Oral  Oral  Resp: 20 20  18   Height:      Weight:   84.46 kg (186 lb 3.2 oz)   SpO2: 98% 97%  93%    Intake/Output Summary (Last 24 hours) at 05/14/13 0744 Last data filed at 05/13/13 2313  Gross per 24 hour  Intake 1266.82 ml  Output   1101 ml  Net 165.82 ml    LABS: Basic Metabolic Panel:  Recent Labs  05/12/13 0555  NA 135*  K 4.0  CL 88*  CO2 33*  GLUCOSE 128*  BUN 46*  CREATININE 1.79*  CALCIUM 9.9   Liver Function Tests: No results found for this basename: AST, ALT, ALKPHOS, BILITOT, PROT, ALBUMIN,  in the last 72 hours No results found for this basename: LIPASE, AMYLASE,  in the last 72 hours CBC:  Recent Labs  05/13/13 0501 05/14/13 0550  WBC 7.5 9.3  HGB 11.9* 12.2  HCT 35.0* 35.9*  MCV 94.9 93.7  PLT 240 264   Cardiac Enzymes: No results found for this basename: CKTOTAL, CKMB, CKMBINDEX, TROPONINI,  in the last 72 hours BNP: No components found with this basename: POCBNP,  D-Dimer: No results found for this  basename: DDIMER,  in the last 72 hours Hemoglobin A1C: No results found for this basename: HGBA1C,  in the last 72 hours Fasting Lipid Panel: No results found for this basename: CHOL, HDL, LDLCALC, TRIG, CHOLHDL, LDLDIRECT,  in the last 72 hours Thyroid Function Tests: No results found for this basename: TSH, T4TOTAL, FREET3, T3FREE, THYROIDAB,  in the last 72 hours Anemia Panel: No results found for this basename: VITAMINB12, FOLATE, FERRITIN, TIBC, IRON, RETICCTPCT,  in the last 72 hours   PHYSICAL EXAM General: NAD Neck: JVP 8 cm, no thyromegaly or thyroid nodule.  Lungs: Dependent crackles CV: Nondisplaced PMI.  Heart regular S1/S2, no S3/S4, 2/6 HSM apex.  Trace ankle edema.  No carotid bruit.  Normal pedal pulses.  Abdomen: Soft, nontender, no hepatosplenomegaly, no distention.  Neurologic: sleeping currently Extremities: No clubbing or cyanosis.   TELEMETRY: Reviewed telemetry pt in NSR, HR 60s  ASSESSMENT AND PLAN: 73 yo was transferred to [facility] for evaluation of mitral regurgitation for potential repair.  She has acute on chronic diastolic CHF and paroxysmal atrial fibrillation.   1. Mitral regurgitation: Suspect severe MR.  2 moderate jets on TEE, additively severe.  Large V-waves on RHC.  Evaluated by CVTS with plan for MV repair and Maze, scheduled Wednesday.  Continue PT work  prior to surgery. 2. Acute on chronic diastolic CHF: Patient is volume overloaded on exam and by RHC.  She was noted to have severe pulmonary HTN on RHC, but PVR only 3.3 WU, suggesting that this was mostly pulmonary venous hypertension.  She diuresed well with Lasix gtt with improvement in breathing.  Volume status is better.  Still suspect she has some volume overload given hypoxia with exercise.  Continue po diuretic with Lasix 60 bid.  3. Atrial fibrillation: Paroxysmal.  Patient seems to be staying more in NSR now.  She is on heparin gtt.  Eventually want to get her back on Eliquis, likely after MV  surgery.  Will need Maze as well.  Amiodarone stopped due to rash, ?from meds.  Will rechallenge today with amiodarone 200 mg bid.  4. Renal dysfunction:  Possibly contrast nephropathy in setting diuresis.  Creatinine trended down.  Need BMET today. 5. Rash: Started after torsemide begun.  This was stopped, back on Lasix.  Rash now gone.  Amiodarone was stopped also.  Will try her back on amiodarone.    [name] 05/14/2013 7:44 AM

## 2013-05-14 NOTE — Progress Notes (Signed)
No change in d/c plan at this time. Patient wants to be admitted to [name] when she is medically stable.  Patient is seeking short term SNF only. CSW spoke with her daughterElizabeth Sauer [phone] who had questions about patient's insurance coverage at [facility] and these questions were answered. Support provided. CSW will continue to monitor for potential stability and d/c date.  [name]. [place], [place]

## 2013-05-14 NOTE — Progress Notes (Signed)
UR completed Zandria Woldt K. Venola Castello, RN, BSN, MSHL, CCM  05/14/2013 1:52 PM

## 2013-05-14 NOTE — Progress Notes (Signed)
[address]       ,[place] [id number]             [phone]     CARDIOTHORACIC SURGERY PROGRESS NOTE  10 Days Post-Op  S/P Procedure(s) (LRB): LEFT AND RIGHT HEART CATHETERIZATION WITH CORONARY ANGIOGRAM (N/A)  Subjective: Patient feels much better.  Ambulating better.  Dyspnea improved.  Objective: Vital signs in last 24 hours: Temp:  [97.4 F (36.3 C)-100 F (37.8 C)] 100 F (37.8 C) (03/09 1842) Pulse Rate:  [61-64] 62 (03/09 1328) Cardiac Rhythm:  [-] Normal sinus rhythm (03/09 0800) Resp:  [18-20] 18 (03/09 1328) BP: (110-129)/(52-93) 121/93 mmHg (03/09 1328) SpO2:  [93 %-99 %] 99 % (03/09 1328) Weight:  [84.46 kg (186 lb 3.2 oz)] 84.46 kg (186 lb 3.2 oz) (03/09 0544)  Physical Exam:  Rhythm:   sinus  Breath sounds: clear  Heart sounds:  RRR w/ murmur  Incisions:  n/a  Abdomen:  soft  Extremities:  warm   Intake/Output from previous day: 03/08 0701 - 03/09 0700 In: 1266.8 [P.O.:1200; I.V.:66.8] Out: 1101 [Urine:1100; Stool:1] Intake/Output this shift:    Lab Results:  Recent Labs  05/13/13 0501 05/14/13 0550  WBC 7.5 9.3  HGB 11.9* 12.2  HCT 35.0* 35.9*  PLT 240 264   BMET:  Recent Labs  05/12/13 0555 05/14/13 0935  NA 135* 134*  K 4.0 4.2  CL 88* 91*  CO2 33* 29  GLUCOSE 128* 230*  BUN 46* 35*  CREATININE 1.79* 1.45*  CALCIUM 9.9 10.0    CBG (last 3)  No results found for this basename: GLUCAP,  in the last 72 hours PT/INR:  No results found for this basename: LABPROT, INR,  in the last 72 hours  CXR:  CHEST 2 VIEW  COMPARISON: A chest x-ray 05/05/2013.  FINDINGS:  Lung volumes are low. No consolidative airspace disease. No pleural  effusions. Mild thickening of the minor fissure may reflect some  adjacent subsegmental scarring, similar to prior examinations. No  evidence of pulmonary edema. Mild cardiomegaly is similar to priors.  Upper mediastinal contours are within normal limits. Atherosclerosis  in the  thoracic aorta. 2 cm right middle lobe nodule again noted,  better demonstrated on recent chest CT.  IMPRESSION:  1. Resolution of previously noted congestive heart failure.  2. Atherosclerosis.  3. 2 cm right middle lobe nodule similar to prior studies, stable  compared to remote prior examinations dating back to at least  01/12/2005, presumably benign.  Electronically Signed  By: Vinnie Langton M.D.  On: 05/13/2013 09:01   Assessment/Plan:  We plan to proceed with minimally invasive mitral valve repair/replacment and maze on Wednesday.  The patient and her daughter were counseled at length regarding the indications, risks and potential benefits of surgery.  Alternative treatment strategies were discussed. They understand and accept all potential risks of surgery including but not limited to risk of death, stroke or other neurologic complication, myocardial infarction, congestive heart failure, respiratory failure, renal failure, bleeding requiring transfusion and/or reexploration, arrhythmia, infection or other wound complications, pneumonia, pleural and/or pericardial effusion, pulmonary embolus, aortic dissection or other major vascular complication, or delayed complications related to valve repair or replacement including but not limited to structural valve deterioration and failure, thrombosis, embolization, endocarditis, or paravalvular leak.  Alternative surgical approaches have been discussed including a comparison between conventional sternotomy and minimally-invasive techniques.  The relative risks and benefits of each have been reviewed as they pertain to the  patient's specific circumstances, and all of their questions have been addressed.  Specific risks potentially related to the minimally-invasive approach were discussed at length, including but not limited to risk of conversion to full or partial sternotomy, aortic dissection or other major vascular complication, unilateral acute lung  injury or pulmonary edema, phrenic nerve dysfunction or paralysis, rib fracture, chronic pain, lung hernia, or lymphocele.  The patient specifically requests that if the mitral valve must be replaced that it be done using a bioprosthetic tissue valve.  All questions have been addressed.  I spent in excess of 35 minutes during the conduct of this hospital encounter and >50% of this time involved direct face-to-face encounter with the patient for counseling and/or coordination of their care.   OWEN,CLARENCE H 05/14/2013 7:24 PM

## 2013-05-14 NOTE — Progress Notes (Signed)
ANTICOAGULATION CONSULT NOTE - Follow Up Consult  Pharmacy Consult for Heparin  Indication: atrial fibrillation  Allergies  Allergen Reactions  . Indomethacin Other (See Comments)    dizziness  . Norvasc [Amlodipine Besylate] Cough    Patient Measurements: Height: 5\' 2"  (157.5 cm) Weight: 186 lb 3.2 oz (84.46 kg) (c scale; a scale broken) IBW/kg (Calculated) : 50.1 Heparin Dosing Weight: ~69 kg  Vital Signs: Temp: 97.4 F (36.3 C) (03/09 0546) Temp src: Oral (03/09 0546) BP: 129/59 mmHg (03/09 0546) Pulse Rate: 64 (03/09 0546)  Labs:  Recent Labs  05/12/13 0555 05/13/13 0501 05/14/13 0550  HGB 12.1 11.9* 12.2  HCT 35.3* 35.0* 35.9*  PLT 236 240 264  HEPARINUNFRC 0.55 0.41 0.81*  CREATININE 1.79*  --   --     Estimated Creatinine Clearance: 28.7 ml/min (by C-G formula based on Cr of 1.79).   Medications:  Heparin 950 units/hr  Assessment: 73 y/o F on heparin for afib, plans for MAZE sometime this week, HL is 0.81 this AM, other labs as above.   Goal of Therapy:  Heparin level 0.3-0.7 units/ml Monitor platelets by anticoagulation protocol: Yes   Plan:  -Decrease heparin drip 850 units/hr -1500 HL -Daily CBC/HL -Monitor for bleeding   Narda Bonds 05/14/2013,7:14 AM

## 2013-05-14 NOTE — Progress Notes (Signed)
PRN Benadryl and Sarna lotion given to pt this evening. Pt stated that these meds have not been effective in reducing itching and rash to neck, back, and R arm. Pt requested another dose of Prednisone this PM because the pt and her daughter stated that this was the only effective med and it would prevent the patient from "being up all night". MD made aware. Orders received to give Prednisone 20 mg PO x1. Will continue to monitor.

## 2013-05-14 NOTE — Progress Notes (Signed)
CARDIAC REHAB PHASE I   PRE:  Rate/Rhythm: 60 SR  BP:  Supine:   Sitting: 113/77  Standing:    SaO2: 97 1L  MODE:  Ambulation: 520 ft   POST:  Rate/Rhythm: 75  BP:  Supine:   Sitting: 140/60  Standing:    SaO2: 87 RA increased to 91 with rest 1000-1050 Assisted X 1 and used walker to ambulate. Gait steady with walker, slow pace. Pt able to walk 520 feet without c/ of pain or SOB. RA sat after walk 87%, but increased to 91% with short rest. Left pt on room air. Pt back to recliner after walk.  Rodney Langton RN 05/14/2013 10:51 AM

## 2013-05-14 NOTE — Progress Notes (Signed)
ANTICOAGULATION CONSULT NOTE - Follow Up Consult  Pharmacy Consult for Heparin  Indication: atrial fibrillation  Allergies  Allergen Reactions  . Indomethacin Other (See Comments)    dizziness  . Norvasc [Amlodipine Besylate] Cough    Patient Measurements: Height: 5\' 2"  (157.5 cm) Weight: 186 lb 3.2 oz (84.46 kg) (c scale; a scale broken) IBW/kg (Calculated) : 50.1 Heparin Dosing Weight: ~69 kg  Vital Signs: Temp: 100 F (37.8 C) (03/09 1842) Temp src: Oral (03/09 1328) BP: 121/93 mmHg (03/09 1328) Pulse Rate: 62 (03/09 1328)  Labs:  Recent Labs  05/12/13 0555 05/13/13 0501 05/14/13 0550 05/14/13 0935 05/14/13 1927  HGB 12.1 11.9* 12.2  --   --   HCT 35.3* 35.0* 35.9*  --   --   PLT 236 240 264  --   --   HEPARINUNFRC 0.55 0.41 0.81*  --  0.60  CREATININE 1.79*  --   --  1.45*  --     Estimated Creatinine Clearance: 35.4 ml/min (by C-G formula based on Cr of 1.45).   Medications:  Heparin 850 units/hr  Assessment: 73 y/o F on heparin for afib, plans for MAZE sometime this week, HL is 0.6, at goal on current rate of heparin.  No bleeding noted per chart notes.  Goal of Therapy:  Heparin level 0.3-0.7 units/ml Monitor platelets by anticoagulation protocol: Yes   Plan:  -Continue IV heparin at current rate. -Daily CBC/HL -Monitor for bleeding   Uvaldo Rising, BCPS  Clinical Pharmacist Pager [phone]  05/14/2013 8:11 PM

## 2013-05-15 ENCOUNTER — Encounter (HOSPITAL_COMMUNITY): Payer: Self-pay | Admitting: Certified Registered Nurse Anesthetist

## 2013-05-15 DIAGNOSIS — I4891 Unspecified atrial fibrillation: Secondary | ICD-10-CM | POA: Diagnosis not present

## 2013-05-15 DIAGNOSIS — I509 Heart failure, unspecified: Secondary | ICD-10-CM | POA: Diagnosis not present

## 2013-05-15 DIAGNOSIS — I5033 Acute on chronic diastolic (congestive) heart failure: Secondary | ICD-10-CM | POA: Diagnosis not present

## 2013-05-15 LAB — CBC
HCT: 35.7 % — ABNORMAL LOW (ref 36.0–46.0)
HEMOGLOBIN: 12.2 g/dL (ref 12.0–15.0)
MCH: 32 pg (ref 26.0–34.0)
MCHC: 34.2 g/dL (ref 30.0–36.0)
MCV: 93.7 fL (ref 78.0–100.0)
Platelets: 273 10*3/uL (ref 150–400)
RBC: 3.81 MIL/uL — ABNORMAL LOW (ref 3.87–5.11)
RDW: 12.9 % (ref 11.5–15.5)
WBC: 8.5 10*3/uL (ref 4.0–10.5)

## 2013-05-15 LAB — HEPARIN LEVEL (UNFRACTIONATED): Heparin Unfractionated: 0.51 IU/mL (ref 0.30–0.70)

## 2013-05-15 LAB — BLOOD GAS, ARTERIAL
ACID-BASE EXCESS: 3.5 mmol/L — AB (ref 0.0–2.0)
BICARBONATE: 27.5 meq/L — AB (ref 20.0–24.0)
Drawn by: 25203
FIO2: 0.21 %
O2 Saturation: 94.6 %
PCO2 ART: 42.1 mmHg (ref 35.0–45.0)
PO2 ART: 71.9 mmHg — AB (ref 80.0–100.0)
Patient temperature: 98.6
TCO2: 28.8 mmol/L (ref 0–100)
pH, Arterial: 7.431 (ref 7.350–7.450)

## 2013-05-15 LAB — BASIC METABOLIC PANEL
BUN: 36 mg/dL — AB (ref 6–23)
CALCIUM: 10.4 mg/dL (ref 8.4–10.5)
CO2: 31 mEq/L (ref 19–32)
Chloride: 93 mEq/L — ABNORMAL LOW (ref 96–112)
Creatinine, Ser: 1.46 mg/dL — ABNORMAL HIGH (ref 0.50–1.10)
GFR calc Af Amer: 40 mL/min — ABNORMAL LOW (ref >90)
GFR, EST NON AFRICAN AMERICAN: 35 mL/min — AB (ref >90)
Glucose, Bld: 94 mg/dL (ref 70–99)
Potassium: 3.8 mEq/L (ref 3.7–5.3)
SODIUM: 139 meq/L (ref 137–147)

## 2013-05-15 LAB — ABO/RH: ABO/RH(D): B POS

## 2013-05-15 LAB — APTT: aPTT: 54 seconds — ABNORMAL HIGH (ref 24–37)

## 2013-05-15 LAB — HEMOGLOBIN A1C
Hgb A1c MFr Bld: 5.9 % — ABNORMAL HIGH (ref <5.7)
MEAN PLASMA GLUCOSE: 123 mg/dL — AB (ref <117)

## 2013-05-15 LAB — PROTIME-INR
INR: 0.95 (ref 0.00–1.49)
Prothrombin Time: 12.5 seconds (ref 11.6–15.2)

## 2013-05-15 LAB — SURGICAL PCR SCREEN
MRSA, PCR: NEGATIVE
STAPHYLOCOCCUS AUREUS: NEGATIVE

## 2013-05-15 MED ORDER — DEXTROSE 5 % IV SOLN
1.5000 g | INTRAVENOUS | Status: AC
  Administered 2013-05-16: .75 g via INTRAVENOUS
  Administered 2013-05-16: 1.5 g via INTRAVENOUS
  Filled 2013-05-15: qty 1.5

## 2013-05-15 MED ORDER — SODIUM CHLORIDE 0.9 % IV SOLN
INTRAVENOUS | Status: DC
  Filled 2013-05-15: qty 30

## 2013-05-15 MED ORDER — DEXMEDETOMIDINE HCL IN NACL 400 MCG/100ML IV SOLN
0.1000 ug/kg/h | INTRAVENOUS | Status: DC
  Filled 2013-05-15: qty 100

## 2013-05-15 MED ORDER — BISACODYL 5 MG PO TBEC
5.0000 mg | DELAYED_RELEASE_TABLET | Freq: Once | ORAL | Status: AC
  Administered 2013-05-15: 5 mg via ORAL
  Filled 2013-05-15: qty 1

## 2013-05-15 MED ORDER — DOPAMINE-DEXTROSE 3.2-5 MG/ML-% IV SOLN
2.0000 ug/kg/min | INTRAVENOUS | Status: DC
  Filled 2013-05-15: qty 250

## 2013-05-15 MED ORDER — PLASMA-LYTE 148 IV SOLN
INTRAVENOUS | Status: AC
  Administered 2013-05-16: 08:00:00
  Filled 2013-05-15: qty 2.5

## 2013-05-15 MED ORDER — DEXTROSE 5 % IV SOLN
750.0000 mg | INTRAVENOUS | Status: DC
  Filled 2013-05-15: qty 750

## 2013-05-15 MED ORDER — METOPROLOL TARTRATE 12.5 MG HALF TABLET
12.5000 mg | ORAL_TABLET | Freq: Once | ORAL | Status: DC
  Filled 2013-05-15: qty 1

## 2013-05-15 MED ORDER — EPINEPHRINE HCL 1 MG/ML IJ SOLN
0.5000 ug/min | INTRAVENOUS | Status: DC
  Filled 2013-05-15 (×2): qty 4

## 2013-05-15 MED ORDER — PHENYLEPHRINE HCL 10 MG/ML IJ SOLN
30.0000 ug/min | INTRAVENOUS | Status: DC
  Filled 2013-05-15: qty 2

## 2013-05-15 MED ORDER — SODIUM CHLORIDE 0.9 % IV SOLN
INTRAVENOUS | Status: DC
  Filled 2013-05-15: qty 40

## 2013-05-15 MED ORDER — POTASSIUM CHLORIDE 2 MEQ/ML IV SOLN
80.0000 meq | INTRAVENOUS | Status: DC
  Filled 2013-05-15: qty 40

## 2013-05-15 MED ORDER — VANCOMYCIN HCL 1000 MG IV SOLR
INTRAVENOUS | Status: AC
  Administered 2013-05-16: 08:00:00
  Filled 2013-05-15 (×2): qty 1000

## 2013-05-15 MED ORDER — TEMAZEPAM 15 MG PO CAPS: 15.0000 mg | ORAL_CAPSULE | Freq: Once | ORAL | Status: AC | PRN

## 2013-05-15 MED ORDER — MAGNESIUM SULFATE 50 % IJ SOLN
40.0000 meq | INTRAMUSCULAR | Status: DC
  Filled 2013-05-15: qty 10

## 2013-05-15 MED ORDER — POTASSIUM CHLORIDE CRYS ER 20 MEQ PO TBCR
40.0000 meq | EXTENDED_RELEASE_TABLET | Freq: Once | ORAL | Status: AC
  Administered 2013-05-15: 40 meq via ORAL
  Filled 2013-05-15: qty 2

## 2013-05-15 MED ORDER — SODIUM CHLORIDE 0.9 % IV SOLN
INTRAVENOUS | Status: DC
  Filled 2013-05-15: qty 1

## 2013-05-15 MED ORDER — NITROGLYCERIN IN D5W 200-5 MCG/ML-% IV SOLN
2.0000 ug/min | INTRAVENOUS | Status: DC
  Filled 2013-05-15: qty 250

## 2013-05-15 MED ORDER — VANCOMYCIN HCL 10 G IV SOLR
1500.0000 mg | INTRAVENOUS | Status: AC
  Administered 2013-05-16: 1500 mg via INTRAVENOUS
  Filled 2013-05-15: qty 1500

## 2013-05-15 NOTE — Progress Notes (Signed)
Report given to receiving RN. Patient in recliner on the telephone with family at beside. No verbal complaints.

## 2013-05-15 NOTE — Progress Notes (Signed)
Patient ID: TIMMIE CALIX, female   DOB: 1941/02/04, 73 y.o.   MRN: [id number] Patient ID: LEESA LEIFHEIT, female   DOB: 1940/03/21, 73 y.o.   MRN: [id number]   SUBJECTIVE: She remains in NSR.  Breathing much better overall, off oxygen.  Rash resolved.   Scheduled Meds: . amiodarone  200 mg Oral BID  . atorvastatin  10 mg Oral q1800  . bisacodyl  5 mg Oral Once  . chlorhexidine  60 mL Topical Once   And  . [START ON 05/16/2013] chlorhexidine  60 mL Topical Once  . darifenacin  7.5 mg Oral Daily  . furosemide  60 mg Oral BID  . [START ON 05/16/2013] metoprolol tartrate  12.5 mg Oral Once  . pantoprazole  40 mg Oral Q0600  . potassium chloride  40 mEq Oral Once  . sodium chloride  3 mL Intravenous Q12H   Continuous Infusions: . sodium chloride Stopped (05/04/13 2100)  . heparin 850 Units/hr (05/15/13 0321)   PRN Meds:.sodium chloride, acetaminophen, albuterol, alum & mag hydroxide-simeth, camphor-menthol, diphenhydrAMINE, HYDROcodone-acetaminophen, ipratropium-albuterol, LORazepam, morphine injection, nitroGLYCERIN, sodium chloride, temazepam    Filed Vitals:   05/14/13 1842 05/14/13 2031 05/15/13 0309 05/15/13 0548  BP:  124/56  105/54  Pulse:  62  64  Temp: 100 F (37.8 C) 98.1 F (36.7 C)  98.4 F (36.9 C)  TempSrc:  Oral  Oral  Resp:  18  16  Height:      Weight:   185 lb 4.8 oz (84.052 kg)   SpO2:  93%  92%    Intake/Output Summary (Last 24 hours) at 05/15/13 0805 Last data filed at 05/15/13 0700  Gross per 24 hour  Intake 1675.37 ml  Output    800 ml  Net 875.37 ml    LABS: Basic Metabolic Panel:  Recent Labs  05/14/13 0935 05/15/13 0415  NA 134* 139  K 4.2 3.8  CL 91* 93*  CO2 29 31  GLUCOSE 230* 94  BUN 35* 36*  CREATININE 1.45* 1.46*  CALCIUM 10.0 10.4   Liver Function Tests:  Recent Labs  05/14/13 0935  AST 21  ALT 21  ALKPHOS 95  BILITOT 0.3  PROT 7.0  ALBUMIN 3.2*   No results found for this basename: LIPASE, AMYLASE,   in the last 72 hours CBC:  Recent Labs  05/14/13 0550 05/15/13 0415  WBC 9.3 8.5  HGB 12.2 12.2  HCT 35.9* 35.7*  MCV 93.7 93.7  PLT 264 273   Cardiac Enzymes: No results found for this basename: CKTOTAL, CKMB, CKMBINDEX, TROPONINI,  in the last 72 hours BNP: No components found with this basename: POCBNP,  D-Dimer: No results found for this basename: DDIMER,  in the last 72 hours Hemoglobin A1C: No results found for this basename: HGBA1C,  in the last 72 hours Fasting Lipid Panel: No results found for this basename: CHOL, HDL, LDLCALC, TRIG, CHOLHDL, LDLDIRECT,  in the last 72 hours Thyroid Function Tests: No results found for this basename: TSH, T4TOTAL, FREET3, T3FREE, THYROIDAB,  in the last 72 hours Anemia Panel: No results found for this basename: VITAMINB12, FOLATE, FERRITIN, TIBC, IRON, RETICCTPCT,  in the last 72 hours   PHYSICAL EXAM General: NAD Neck: JVP 8 cm, no thyromegaly or thyroid nodule.  Lungs: Dependent crackles CV: Nondisplaced PMI.  Heart regular S1/S2, no S3/S4, 2/6 HSM apex.  Trace ankle edema.  No carotid bruit.  Normal pedal pulses.  Abdomen: Soft, nontender, no hepatosplenomegaly, no distention.  Neurologic: sleeping  currently Extremities: No clubbing or cyanosis.   TELEMETRY: Reviewed telemetry pt in NSR, HR 60s  ASSESSMENT AND PLAN: 73 yo was transferred to [facility] for evaluation of mitral regurgitation for potential repair.  She has acute on chronic diastolic CHF and paroxysmal atrial fibrillation.   1. Mitral regurgitation: Suspect severe MR.  2 moderate jets on TEE, additively severe.  Large V-waves on RHC.  Evaluated by CVTS with plan for MV repair and Maze, scheduled Wednesday.  Continue PT work prior to surgery. 2. Acute on chronic diastolic CHF: Patient is volume overloaded on exam and by RHC.  She was noted to have severe pulmonary HTN on RHC, but PVR only 3.3 WU, suggesting that this was mostly pulmonary venous hypertension.  She diuresed  well with Lasix gtt with improvement in breathing.  Volume status is better.  Continue po diuretic with Lasix 60 bid.  3. Atrial fibrillation: Paroxysmal.  NSR this morning.  She is on heparin gtt.  Eventually want to get her back on Eliquis, likely after MV surgery.  Will need Maze as well.  Continue amiodarone 200 mg bid.  4. Renal dysfunction:  Possibly contrast nephropathy in setting diuresis.  Creatinine trended down.  5. Rash: Started after torsemide begun.  This was stopped, back on Lasix.  Rash now gone.  Amiodarone was stopped also.  She has been restarted on amiodarone with no problem.     [name] 05/15/2013 8:05 AM

## 2013-05-15 NOTE — Progress Notes (Signed)
Pt. Alert and oriented this am. Resting quietly in bed. No s/s of distress or discomfort noted during the night. Pts. Daughter at the bedside. Pt. Denies pain. VSS. Blood pressure 105/54, pulse 64, temperature 98.4 F (36.9 C), temperature source Oral, resp. rate 16, height 5\' 2"  (1.575 m), weight 84.052 kg (185 lb 4.8 oz), SpO2 92.00%. RN will continue to monitor pt. For changes in condition. Evelyne Makepeace, [name]

## 2013-05-15 NOTE — Progress Notes (Signed)
CARDIAC REHAB PHASE I   PRE:  Rate/Rhythm: 65 SR  BP:  Supine:   Sitting: 140/60  Standing:    SaO2: 97 RA  MODE:  Ambulation: 800 ft   POST:  Rate/Rhythm: 86  BP:  Supine:   Sitting: 140/64  Standing:    SaO2: 96 RA 1415-1500 Assisted X 1 and used walker to ambulate. Gait steady with walker, slow pace. Pt able to walk 800 fett without c/o. VS stable Pt back to recliner after walk with call light in reach and daughter present. Completed pre-op heart surgery education with pt and daughter. She has going for heart surgery booklet, I gave them pt care guide and they have watched heart surgery video. We discussed use of IS and walking post-op.  Rodney Langton RN 05/15/2013 3:01 PM

## 2013-05-15 NOTE — Progress Notes (Signed)
T[facility] BRIEF PROGRESS NOTE   For OR in the am tomorrow. All questions answered.  [name] [name] H 05/15/2013 4:46 PM

## 2013-05-15 NOTE — Progress Notes (Signed)
Physical Therapy Treatment Patient Details Name: [name] [name] MRN: [id number] DOB: 02-Jan-1941 Today's Date: 05/15/2013 Time: 2993-7169 PT Time Calculation (min): 39 min  PT Assessment / Plan / Recommendation  History of Present Illness Patient is a 73 year old obese widowed white female from [place] with remote history of rheumatic fever during childhood and long-standing history of chronic diastolic congestive heart failure who has recently experienced multiple recurrent episodes of acute exacerbation of chronic diastolic congestive heart failure.  She has been diagnosed with severe, symptomatic mitral regurgitation with recurrent paroxysmal atrial fibrillation    PT Comments   Pt progressing with mobility and able to ambulate 800' today at slow speed but with sats maintained 91-95% on RA and HR 84-88. Pt eager for surgery and to hopefully maintain mobility for return home as this is pt's goal. Will continue to follow and encouraged pt to continue HEP.   Follow Up Recommendations  Home health PT     Does the patient have the potential to tolerate intense rehabilitation     Barriers to Discharge        Equipment Recommendations       Recommendations for Other Services    Frequency     Progress towards PT Goals Progress towards PT goals: Progressing toward goals  Plan Discharge plan needs to be updated    Precautions / Restrictions Precautions Precautions: Fall   Pertinent Vitals/Pain No pain    Mobility  Transfers Sit to Stand: Modified independent (Device/Increase time) Ambulation/Gait Ambulation/Gait assistance: Supervision Ambulation Distance (Feet): 800 Feet Assistive device: Rolling walker (2 wheeled) Gait Pattern/deviations: Step-through pattern;Decreased stride length Gait velocity interpretation: Below normal speed for age/gender General Gait Details: Pt with good use of RW with cues for posture without rest breaks again today    Exercises General Exercises -  Lower Extremity Long Arc Quad: AROM;Seated;Both;20 reps Hip ABduction/ADduction: AROM;Seated;Both;20 reps Hip Flexion/Marching: AROM;Seated;Both;20 reps Toe Raises: AROM;Seated;Both;20 reps Heel Raises: AROM;Seated;Both;20 reps   PT Diagnosis:    PT Problem List:   PT Treatment Interventions:     PT Goals (current goals can now be found in the care plan section)    Visit Information  Last PT Received On: 05/15/13 Assistance Needed: +1 History of Present Illness: Patient is a 73 year old obese widowed white female from [place] with remote history of rheumatic fever during childhood and long-standing history of chronic diastolic congestive heart failure who has recently experienced multiple recurrent episodes of acute exacerbation of chronic diastolic congestive heart failure.  She has been diagnosed with severe, symptomatic mitral regurgitation with recurrent paroxysmal atrial fibrillation     Subjective Data      Cognition  Cognition Arousal/Alertness: Awake/alert Behavior During Therapy: WFL for tasks assessed/performed Overall Cognitive Status: Within Functional Limits for tasks assessed    Balance     End of Session PT - End of Session Equipment Utilized During Treatment: Gait belt Activity Tolerance: Patient tolerated treatment well Patient left: in chair;with call bell/phone within reach Nurse Communication: Mobility status   GP     Melford Aase 05/15/2013, 1:34 PM Elwyn Reach, [place]

## 2013-05-15 NOTE — Progress Notes (Signed)
ANTICOAGULATION CONSULT NOTE - Follow Up Consult  Pharmacy Consult for Heparin  Indication: atrial fibrillation  Allergies  Allergen Reactions  . Indomethacin Other (See Comments)    dizziness  . Norvasc [Amlodipine Besylate] Cough    Patient Measurements: Height: 5\' 2"  (157.5 cm) Weight: 185 lb 4.8 oz (84.052 kg) (c scale; a scale is broken) IBW/kg (Calculated) : 50.1 Heparin Dosing Weight: ~69 kg  Vital Signs: Temp: 98.4 F (36.9 C) (03/10 0548) Temp src: Oral (03/10 0548) BP: 105/54 mmHg (03/10 0548) Pulse Rate: 64 (03/10 0548)  Labs:  Recent Labs  05/13/13 0501 05/14/13 0550 05/14/13 0935 05/14/13 1927 05/15/13 0415  HGB 11.9* 12.2  --   --  12.2  HCT 35.0* 35.9*  --   --  35.7*  PLT 240 264  --   --  273  HEPARINUNFRC 0.41 0.81*  --  0.60 0.51  CREATININE  --   --  1.45*  --  1.46*    Estimated Creatinine Clearance: 35 ml/min (by C-G formula based on Cr of 1.46).   Medications:  Heparin 850 units/hr  Assessment: 73 y/o F on heparin for afib, plans for MAZE on wednesday, HL is 0.5, at goal on current rate of heparin.  No bleeding noted per chart notes.  Goal of Therapy:  Heparin level 0.3-0.7 units/ml Monitor platelets by anticoagulation protocol: Yes   Plan:  -Continue IV heparin at current rate. -Daily CBC/HL -Monitor for bleeding   Erin Hearing PharmD., BCPS Clinical Pharmacist Pager [phone] 05/15/2013 7:52 AM

## 2013-05-16 ENCOUNTER — Inpatient Hospital Stay (HOSPITAL_COMMUNITY): Payer: Medicare HMO

## 2013-05-16 ENCOUNTER — Encounter (HOSPITAL_COMMUNITY)
Admission: EM | Disposition: A | Discharge status: Skilled Nursing Facility | Payer: Commercial Managed Care - HMO | Source: Home / Self Care | Attending: Thoracic Surgery (Cardiothoracic Vascular Surgery)

## 2013-05-16 ENCOUNTER — Encounter (HOSPITAL_COMMUNITY): Payer: Self-pay | Admitting: Thoracic Surgery (Cardiothoracic Vascular Surgery)

## 2013-05-16 ENCOUNTER — Inpatient Hospital Stay (HOSPITAL_COMMUNITY): Payer: Medicare HMO | Admitting: Certified Registered Nurse Anesthetist

## 2013-05-16 ENCOUNTER — Encounter (HOSPITAL_COMMUNITY): Payer: Medicare HMO | Admitting: Certified Registered Nurse Anesthetist

## 2013-05-16 DIAGNOSIS — Z953 Presence of xenogenic heart valve: Secondary | ICD-10-CM

## 2013-05-16 DIAGNOSIS — I059 Rheumatic mitral valve disease, unspecified: Secondary | ICD-10-CM

## 2013-05-16 DIAGNOSIS — Z9889 Other specified postprocedural states: Secondary | ICD-10-CM

## 2013-05-16 DIAGNOSIS — Z8679 Personal history of other diseases of the circulatory system: Secondary | ICD-10-CM

## 2013-05-16 DIAGNOSIS — Q231 Congenital insufficiency of aortic valve: Secondary | ICD-10-CM

## 2013-05-16 DIAGNOSIS — I4891 Unspecified atrial fibrillation: Secondary | ICD-10-CM

## 2013-05-16 HISTORY — PX: MITRAL VALVE REPLACEMENT: SHX147

## 2013-05-16 HISTORY — PX: INTRAOPERATIVE TRANSESOPHAGEAL ECHOCARDIOGRAM: SHX5062

## 2013-05-16 HISTORY — PX: MINIMALLY INVASIVE MAZE PROCEDURE: SHX6244

## 2013-05-16 HISTORY — DX: Presence of xenogenic heart valve: Z95.3

## 2013-05-16 HISTORY — DX: Personal history of other diseases of the circulatory system: Z86.79

## 2013-05-16 LAB — URINALYSIS, ROUTINE W REFLEX MICROSCOPIC
BILIRUBIN URINE: NEGATIVE
Glucose, UA: NEGATIVE mg/dL
HGB URINE DIPSTICK: NEGATIVE
KETONES UR: NEGATIVE mg/dL
Nitrite: NEGATIVE
Protein, ur: NEGATIVE mg/dL
SPECIFIC GRAVITY, URINE: 1.009 (ref 1.005–1.030)
UROBILINOGEN UA: 0.2 mg/dL (ref 0.0–1.0)
pH: 7.5 (ref 5.0–8.0)

## 2013-05-16 LAB — BASIC METABOLIC PANEL
BUN: 31 mg/dL — ABNORMAL HIGH (ref 6–23)
CHLORIDE: 96 meq/L (ref 96–112)
CO2: 26 mEq/L (ref 19–32)
Calcium: 10.3 mg/dL (ref 8.4–10.5)
Creatinine, Ser: 1.52 mg/dL — ABNORMAL HIGH (ref 0.50–1.10)
GFR, EST AFRICAN AMERICAN: 38 mL/min — AB (ref >90)
GFR, EST NON AFRICAN AMERICAN: 33 mL/min — AB (ref >90)
GLUCOSE: 119 mg/dL — AB (ref 70–99)
POTASSIUM: 4.1 meq/L (ref 3.7–5.3)
SODIUM: 137 meq/L (ref 137–147)

## 2013-05-16 LAB — CBC
HCT: 36.9 % (ref 36.0–46.0)
HCT: 28.3 % — ABNORMAL LOW (ref 36.0–46.0)
Hemoglobin: 12.5 g/dL (ref 12.0–15.0)
Hemoglobin: 10 g/dL — ABNORMAL LOW (ref 12.0–15.0)
MCH: 32.2 pg (ref 26.0–34.0)
MCH: 31.9 pg (ref 26.0–34.0)
MCHC: 33.9 g/dL (ref 30.0–36.0)
MCHC: 35.3 g/dL (ref 30.0–36.0)
MCV: 95.1 fL (ref 78.0–100.0)
MCV: 90.4 fL (ref 78.0–100.0)
Platelets: 289 10*3/uL (ref 150–400)
Platelets: 178 10*3/uL (ref 150–400)
RBC: 3.88 MIL/uL (ref 3.87–5.11)
RBC: 3.13 MIL/uL — AB (ref 3.87–5.11)
RDW: 13.2 % (ref 11.5–15.5)
RDW: 14.9 % (ref 11.5–15.5)
WBC: 6.7 10*3/uL (ref 4.0–10.5)
WBC: 15.2 10*3/uL — ABNORMAL HIGH (ref 4.0–10.5)

## 2013-05-16 LAB — POCT I-STAT 4, (NA,K, GLUC, HGB,HCT)
GLUCOSE: 113 mg/dL — AB (ref 70–99)
GLUCOSE: 111 mg/dL — AB (ref 70–99)
GLUCOSE: 111 mg/dL — AB (ref 70–99)
Glucose, Bld: 182 mg/dL — ABNORMAL HIGH (ref 70–99)
Glucose, Bld: 97 mg/dL (ref 70–99)
Glucose, Bld: 110 mg/dL — ABNORMAL HIGH (ref 70–99)
Glucose, Bld: 135 mg/dL — ABNORMAL HIGH (ref 70–99)
Glucose, Bld: 140 mg/dL — ABNORMAL HIGH (ref 70–99)
Glucose, Bld: 138 mg/dL — ABNORMAL HIGH (ref 70–99)
Glucose, Bld: 180 mg/dL — ABNORMAL HIGH (ref 70–99)
Glucose, Bld: 173 mg/dL — ABNORMAL HIGH (ref 70–99)
HCT: 33 % — ABNORMAL LOW (ref 36.0–46.0)
HCT: 29 % — ABNORMAL LOW (ref 36.0–46.0)
HCT: 24 % — ABNORMAL LOW (ref 36.0–46.0)
HCT: 22 % — ABNORMAL LOW (ref 36.0–46.0)
HCT: 24 % — ABNORMAL LOW (ref 36.0–46.0)
HCT: 26 % — ABNORMAL LOW (ref 36.0–46.0)
HCT: 26 % — ABNORMAL LOW (ref 36.0–46.0)
HCT: 30 % — ABNORMAL LOW (ref 36.0–46.0)
HEMATOCRIT: 21 % — AB (ref 36.0–46.0)
HEMATOCRIT: 21 % — AB (ref 36.0–46.0)
HEMATOCRIT: 30 % — AB (ref 36.0–46.0)
HEMOGLOBIN: 11.2 g/dL — AB (ref 12.0–15.0)
HEMOGLOBIN: 8.2 g/dL — AB (ref 12.0–15.0)
HEMOGLOBIN: 8.8 g/dL — AB (ref 12.0–15.0)
HEMOGLOBIN: 10.2 g/dL — AB (ref 12.0–15.0)
Hemoglobin: 9.9 g/dL — ABNORMAL LOW (ref 12.0–15.0)
Hemoglobin: 7.1 g/dL — ABNORMAL LOW (ref 12.0–15.0)
Hemoglobin: 7.5 g/dL — ABNORMAL LOW (ref 12.0–15.0)
Hemoglobin: 8.2 g/dL — ABNORMAL LOW (ref 12.0–15.0)
Hemoglobin: 8.8 g/dL — ABNORMAL LOW (ref 12.0–15.0)
Hemoglobin: 7.1 g/dL — ABNORMAL LOW (ref 12.0–15.0)
Hemoglobin: 10.2 g/dL — ABNORMAL LOW (ref 12.0–15.0)
POTASSIUM: 3.6 meq/L — AB (ref 3.7–5.3)
Potassium: 4.1 mEq/L (ref 3.7–5.3)
Potassium: 3.1 mEq/L — ABNORMAL LOW (ref 3.7–5.3)
Potassium: 4.5 mEq/L (ref 3.7–5.3)
Potassium: 4.6 mEq/L (ref 3.7–5.3)
Potassium: 4.5 mEq/L (ref 3.7–5.3)
Potassium: 5.4 mEq/L — ABNORMAL HIGH (ref 3.7–5.3)
Potassium: 5.3 mEq/L (ref 3.7–5.3)
Potassium: 5.8 mEq/L — ABNORMAL HIGH (ref 3.7–5.3)
Potassium: 4.6 mEq/L (ref 3.7–5.3)
Potassium: 4.8 mEq/L (ref 3.7–5.3)
SODIUM: 137 meq/L (ref 137–147)
SODIUM: 136 meq/L — AB (ref 137–147)
SODIUM: 136 meq/L — AB (ref 137–147)
SODIUM: 137 meq/L (ref 137–147)
SODIUM: 137 meq/L (ref 137–147)
SODIUM: 138 meq/L (ref 137–147)
Sodium: 136 mEq/L — ABNORMAL LOW (ref 137–147)
Sodium: 135 mEq/L — ABNORMAL LOW (ref 137–147)
Sodium: 135 mEq/L — ABNORMAL LOW (ref 137–147)
Sodium: 137 mEq/L (ref 137–147)
Sodium: 140 mEq/L (ref 137–147)

## 2013-05-16 LAB — POCT I-STAT 3, ART BLOOD GAS (G3+)
ACID-BASE DEFICIT: 1 mmol/L (ref 0.0–2.0)
Acid-Base Excess: 5 mmol/L — ABNORMAL HIGH (ref 0.0–2.0)
Acid-Base Excess: 3 mmol/L — ABNORMAL HIGH (ref 0.0–2.0)
BICARBONATE: 27.9 meq/L — AB (ref 20.0–24.0)
Bicarbonate: 23 mEq/L (ref 20.0–24.0)
Bicarbonate: 28.2 mEq/L — ABNORMAL HIGH (ref 20.0–24.0)
O2 SAT: 96 %
O2 Saturation: 100 %
O2 Saturation: 95 %
PCO2 ART: 32.7 mmHg — AB (ref 35.0–45.0)
PCO2 ART: 36.7 mmHg (ref 35.0–45.0)
PH ART: 7.538 — AB (ref 7.350–7.450)
PH ART: 7.4 (ref 7.350–7.450)
PO2 ART: 368 mmHg — AB (ref 80.0–100.0)
TCO2: 29 mmol/L (ref 0–100)
TCO2: 24 mmol/L (ref 0–100)
TCO2: 30 mmol/L (ref 0–100)
pCO2 arterial: 44.7 mmHg (ref 35.0–45.0)
pH, Arterial: 7.405 (ref 7.350–7.450)
pO2, Arterial: 74 mmHg — ABNORMAL LOW (ref 80.0–100.0)
pO2, Arterial: 75 mmHg — ABNORMAL LOW (ref 80.0–100.0)

## 2013-05-16 LAB — URINE MICROSCOPIC-ADD ON

## 2013-05-16 LAB — PREPARE RBC (CROSSMATCH)

## 2013-05-16 LAB — HEMOGLOBIN AND HEMATOCRIT, BLOOD
HCT: 25.6 % — ABNORMAL LOW (ref 36.0–46.0)
HCT: 28.5 % — ABNORMAL LOW (ref 36.0–46.0)
HEMOGLOBIN: 8.8 g/dL — AB (ref 12.0–15.0)
Hemoglobin: 9.9 g/dL — ABNORMAL LOW (ref 12.0–15.0)

## 2013-05-16 LAB — PLATELET COUNT
PLATELETS: 136 10*3/uL — AB (ref 150–400)
PLATELETS: 92 10*3/uL — AB (ref 150–400)

## 2013-05-16 LAB — PROTIME-INR
INR: 1.31 (ref 0.00–1.49)
PROTHROMBIN TIME: 16 s — AB (ref 11.6–15.2)

## 2013-05-16 LAB — APTT: aPTT: 33 seconds (ref 24–37)

## 2013-05-16 SURGERY — MAZE PROCEDURE, CARDIAC, MINIMALLY INVASIVE
Anesthesia: General | Site: Chest | Laterality: Right

## 2013-05-16 MED ORDER — MIDAZOLAM HCL 10 MG/2ML IJ SOLN
INTRAMUSCULAR | Status: AC
  Filled 2013-05-16: qty 2

## 2013-05-16 MED ORDER — MORPHINE SULFATE 2 MG/ML IJ SOLN
2.0000 mg | INTRAMUSCULAR | Status: DC | PRN
  Administered 2013-05-17: 2 mg via INTRAVENOUS
  Filled 2013-05-16: qty 1

## 2013-05-16 MED ORDER — VECURONIUM BROMIDE 10 MG IV SOLR
INTRAVENOUS | Status: DC | PRN
  Administered 2013-05-16: 4 mg via INTRAVENOUS
  Administered 2013-05-16 (×4): 5 mg via INTRAVENOUS

## 2013-05-16 MED ORDER — FENTANYL CITRATE 0.05 MG/ML IJ SOLN
INTRAMUSCULAR | Status: AC
  Filled 2013-05-16: qty 5

## 2013-05-16 MED ORDER — ALBUMIN HUMAN 5 % IV SOLN
250.0000 mL | INTRAVENOUS | Status: AC | PRN
  Administered 2013-05-16: 250 mL via INTRAVENOUS

## 2013-05-16 MED ORDER — ACETAMINOPHEN 650 MG RE SUPP
650.0000 mg | Freq: Once | RECTAL | Status: AC
  Administered 2013-05-16: 650 mg via RECTAL

## 2013-05-16 MED ORDER — ASPIRIN 81 MG PO CHEW
324.0000 mg | CHEWABLE_TABLET | Freq: Every day | ORAL | Status: DC
  Administered 2013-05-17 – 2013-05-19 (×3): 324 mg
  Filled 2013-05-16 (×3): qty 4

## 2013-05-16 MED ORDER — PROTAMINE SULFATE 10 MG/ML IV SOLN
INTRAVENOUS | Status: DC | PRN
  Administered 2013-05-16: 50 mg via INTRAVENOUS
  Administered 2013-05-16: 70 mg via INTRAVENOUS
  Administered 2013-05-16: 50 mg via INTRAVENOUS
  Administered 2013-05-16: 30 mg via INTRAVENOUS
  Administered 2013-05-16: 100 mg via INTRAVENOUS

## 2013-05-16 MED ORDER — PHENYLEPHRINE HCL 10 MG/ML IJ SOLN
10.0000 mg | INTRAVENOUS | Status: DC | PRN
  Administered 2013-05-16: 10 ug/min via INTRAVENOUS

## 2013-05-16 MED ORDER — MINERAL OIL LIGHT 100 % EX OIL
TOPICAL_OIL | CUTANEOUS | Status: AC
  Filled 2013-05-16: qty 25

## 2013-05-16 MED ORDER — METOPROLOL TARTRATE 12.5 MG HALF TABLET
12.5000 mg | ORAL_TABLET | Freq: Two times a day (BID) | ORAL | Status: DC
  Filled 2013-05-16 (×5): qty 1

## 2013-05-16 MED ORDER — SODIUM CHLORIDE 0.9 % IV SOLN: 250.0000 mL | INTRAVENOUS | Status: DC

## 2013-05-16 MED ORDER — SODIUM CHLORIDE 0.9 % IJ SOLN: 3.0000 mL | INTRAMUSCULAR | Status: DC | PRN

## 2013-05-16 MED ORDER — VANCOMYCIN HCL IN DEXTROSE 1-5 GM/200ML-% IV SOLN
1000.0000 mg | Freq: Once | INTRAVENOUS | Status: AC
  Administered 2013-05-16: 1000 mg via INTRAVENOUS
  Filled 2013-05-16: qty 200

## 2013-05-16 MED ORDER — LACTATED RINGERS IV SOLN
INTRAVENOUS | Status: DC | PRN
  Administered 2013-05-16 (×2): via INTRAVENOUS

## 2013-05-16 MED ORDER — ACETAMINOPHEN 160 MG/5ML PO SOLN: 650.0000 mg | Freq: Once | ORAL | Status: AC

## 2013-05-16 MED ORDER — ONDANSETRON HCL 4 MG/2ML IJ SOLN: 4.0000 mg | Freq: Four times a day (QID) | INTRAMUSCULAR | Status: DC | PRN

## 2013-05-16 MED ORDER — SODIUM CHLORIDE 0.9 % IJ SOLN
3.0000 mL | Freq: Two times a day (BID) | INTRAMUSCULAR | Status: DC
  Administered 2013-05-17 – 2013-05-18 (×4): 3 mL via INTRAVENOUS

## 2013-05-16 MED ORDER — MILRINONE IN DEXTROSE 20 MG/100ML IV SOLN
0.3000 ug/kg/min | INTRAVENOUS | Status: DC
  Administered 2013-05-16: 0.375 ug/kg/min via INTRAVENOUS
  Administered 2013-05-17: 0.3 ug/kg/min via INTRAVENOUS
  Administered 2013-05-17: 0.375 ug/kg/min via INTRAVENOUS
  Filled 2013-05-16 (×3): qty 100

## 2013-05-16 MED ORDER — FENTANYL CITRATE 0.05 MG/ML IJ SOLN
INTRAMUSCULAR | Status: DC | PRN
  Administered 2013-05-16: 250 ug via INTRAVENOUS
  Administered 2013-05-16: 150 ug via INTRAVENOUS
  Administered 2013-05-16 (×4): 50 ug via INTRAVENOUS
  Administered 2013-05-16: 100 ug via INTRAVENOUS
  Administered 2013-05-16: 50 ug via INTRAVENOUS
  Administered 2013-05-16 (×10): 100 ug via INTRAVENOUS

## 2013-05-16 MED ORDER — FENTANYL CITRATE 0.05 MG/ML IJ SOLN
INTRAMUSCULAR | Status: AC
Start: 2013-05-16 — End: 2013-05-16
  Filled 2013-05-16: qty 5

## 2013-05-16 MED ORDER — MIDAZOLAM HCL 2 MG/2ML IJ SOLN
INTRAMUSCULAR | Status: AC
  Filled 2013-05-16: qty 2

## 2013-05-16 MED ORDER — ACETAMINOPHEN 500 MG PO TABS
1000.0000 mg | ORAL_TABLET | Freq: Four times a day (QID) | ORAL | Status: DC
  Filled 2013-05-16 (×12): qty 2

## 2013-05-16 MED ORDER — SODIUM CHLORIDE 0.9 % IV SOLN
100.0000 [IU] | INTRAVENOUS | Status: DC | PRN
  Administered 2013-05-16: 3.7 [IU]/h via INTRAVENOUS

## 2013-05-16 MED ORDER — MILRINONE IN DEXTROSE 20 MG/100ML IV SOLN
0.1250 ug/kg/min | INTRAVENOUS | Status: DC
  Filled 2013-05-16: qty 100

## 2013-05-16 MED ORDER — VECURONIUM BROMIDE 10 MG IV SOLR
INTRAVENOUS | Status: AC
  Filled 2013-05-16: qty 20

## 2013-05-16 MED ORDER — FAMOTIDINE IN NACL 20-0.9 MG/50ML-% IV SOLN
20.0000 mg | Freq: Two times a day (BID) | INTRAVENOUS | Status: AC
  Administered 2013-05-16 – 2013-05-17 (×2): 20 mg via INTRAVENOUS
  Filled 2013-05-16: qty 50

## 2013-05-16 MED ORDER — PHENYLEPHRINE HCL 10 MG/ML IJ SOLN
0.0000 ug/min | INTRAVENOUS | Status: DC
  Administered 2013-05-16: 20 ug/min via INTRAVENOUS
  Filled 2013-05-16 (×2): qty 2

## 2013-05-16 MED ORDER — SODIUM CHLORIDE 0.45 % IV SOLN
INTRAVENOUS | Status: DC
  Administered 2013-05-16: 20 mL/h via INTRAVENOUS
  Administered 2013-05-18 – 2013-05-19 (×2): via INTRAVENOUS
  Administered 2013-05-21: 20 mL/h via INTRAVENOUS

## 2013-05-16 MED ORDER — PROPOFOL 10 MG/ML IV BOLUS
INTRAVENOUS | Status: AC
  Filled 2013-05-16: qty 20

## 2013-05-16 MED ORDER — LACTATED RINGERS IV SOLN
INTRAVENOUS | Status: DC
  Administered 2013-05-16: 20 mL/h via INTRAVENOUS

## 2013-05-16 MED ORDER — METOPROLOL TARTRATE 25 MG/10 ML ORAL SUSPENSION
12.5000 mg | Freq: Two times a day (BID) | ORAL | Status: DC
  Filled 2013-05-16 (×5): qty 5

## 2013-05-16 MED ORDER — BISACODYL 5 MG PO TBEC: 10.0000 mg | DELAYED_RELEASE_TABLET | Freq: Every day | ORAL | Status: DC

## 2013-05-16 MED ORDER — POTASSIUM CHLORIDE 10 MEQ/50ML IV SOLN: 10.0000 meq | INTRAVENOUS | Status: AC

## 2013-05-16 MED ORDER — METOPROLOL TARTRATE 1 MG/ML IV SOLN: 2.5000 mg | INTRAVENOUS | Status: DC | PRN

## 2013-05-16 MED ORDER — MIDAZOLAM HCL 2 MG/2ML IJ SOLN: 2.0000 mg | INTRAMUSCULAR | Status: DC | PRN

## 2013-05-16 MED ORDER — DOPAMINE-DEXTROSE 3.2-5 MG/ML-% IV SOLN
INTRAVENOUS | Status: DC | PRN
  Administered 2013-05-16: 3 ug/kg/min via INTRAVENOUS

## 2013-05-16 MED ORDER — SODIUM CHLORIDE 0.9 % IV SOLN
INTRAVENOUS | Status: DC
  Administered 2013-05-16: 3.4 [IU]/h via INTRAVENOUS
  Administered 2013-05-17: 20:00:00 via INTRAVENOUS
  Filled 2013-05-16 (×2): qty 1

## 2013-05-16 MED ORDER — DEXMEDETOMIDINE HCL IN NACL 200 MCG/50ML IV SOLN
0.1000 ug/kg/h | INTRAVENOUS | Status: DC
  Administered 2013-05-16 – 2013-05-17 (×3): 0.7 ug/kg/h via INTRAVENOUS
  Administered 2013-05-18: 0.5 ug/kg/h via INTRAVENOUS
  Administered 2013-05-18: 0.4 ug/kg/h via INTRAVENOUS
  Administered 2013-05-18: 0.7 ug/kg/h via INTRAVENOUS
  Administered 2013-05-18: 0.5 ug/kg/h via INTRAVENOUS
  Administered 2013-05-18 (×2): 0.7 ug/kg/h via INTRAVENOUS
  Administered 2013-05-19: 0.3 ug/kg/h via INTRAVENOUS
  Administered 2013-05-19: 0.7 ug/kg/h via INTRAVENOUS
  Administered 2013-05-19: 0.5 ug/kg/h via INTRAVENOUS
  Filled 2013-05-16 (×11): qty 50

## 2013-05-16 MED ORDER — LEVALBUTEROL HCL 0.63 MG/3ML IN NEBU
0.6300 mg | INHALATION_SOLUTION | Freq: Four times a day (QID) | RESPIRATORY_TRACT | Status: DC | PRN
  Administered 2013-05-17: 0.63 mg via RESPIRATORY_TRACT
  Filled 2013-05-16: qty 3

## 2013-05-16 MED ORDER — SODIUM CHLORIDE 0.9 % IV SOLN
200.0000 ug | INTRAVENOUS | Status: DC | PRN
  Administered 2013-05-16: 0.3 ug/kg/h via INTRAVENOUS
  Administered 2013-05-16: 18:00:00 via INTRAVENOUS

## 2013-05-16 MED ORDER — NITROGLYCERIN IN D5W 200-5 MCG/ML-% IV SOLN
0.0000 ug/min | INTRAVENOUS | Status: DC
  Administered 2013-05-16: 5 ug/min via INTRAVENOUS

## 2013-05-16 MED ORDER — MAGNESIUM SULFATE 4000MG/100ML IJ SOLN
4.0000 g | Freq: Once | INTRAMUSCULAR | Status: AC
  Administered 2013-05-16: 4 g via INTRAVENOUS
  Filled 2013-05-16: qty 100

## 2013-05-16 MED ORDER — ROCURONIUM BROMIDE 100 MG/10ML IV SOLN
INTRAVENOUS | Status: DC | PRN
  Administered 2013-05-16: 50 mg via INTRAVENOUS

## 2013-05-16 MED ORDER — GLYCOPYRROLATE 0.2 MG/ML IJ SOLN
INTRAMUSCULAR | Status: DC | PRN
  Administered 2013-05-16: 0.2 mg via INTRAVENOUS

## 2013-05-16 MED ORDER — ALBUTEROL SULFATE HFA 108 (90 BASE) MCG/ACT IN AERS
INHALATION_SPRAY | RESPIRATORY_TRACT | Status: AC
  Filled 2013-05-16: qty 6.7

## 2013-05-16 MED ORDER — AMINOCAPROIC ACID 250 MG/ML IV SOLN: INTRAVENOUS | Status: DC | PRN

## 2013-05-16 MED ORDER — MORPHINE SULFATE 2 MG/ML IJ SOLN: 1.0000 mg | INTRAMUSCULAR | Status: AC | PRN

## 2013-05-16 MED ORDER — METOPROLOL TARTRATE 1 MG/ML IV SOLN
5.0000 mg | Freq: Once | INTRAVENOUS | Status: AC
  Administered 2013-05-16: 5 mg via INTRAVENOUS

## 2013-05-16 MED ORDER — SODIUM CHLORIDE 0.9 % IV SOLN
5.0000 g/h | INTRAVENOUS | Status: DC
  Filled 2013-05-16: qty 20

## 2013-05-16 MED ORDER — DEXTROSE 5 % IV SOLN
1.5000 g | Freq: Two times a day (BID) | INTRAVENOUS | Status: AC
  Administered 2013-05-16 – 2013-05-18 (×4): 1.5 g via INTRAVENOUS
  Filled 2013-05-16 (×4): qty 1.5

## 2013-05-16 MED ORDER — LACTATED RINGERS IV SOLN
INTRAVENOUS | Status: DC | PRN
  Administered 2013-05-16 (×2): via INTRAVENOUS

## 2013-05-16 MED ORDER — ASPIRIN EC 325 MG PO TBEC
325.0000 mg | DELAYED_RELEASE_TABLET | Freq: Every day | ORAL | Status: DC
  Administered 2013-05-20: 325 mg via ORAL
  Filled 2013-05-16 (×5): qty 1

## 2013-05-16 MED ORDER — METOPROLOL TARTRATE 1 MG/ML IV SOLN
INTRAVENOUS | Status: AC
  Filled 2013-05-16: qty 5

## 2013-05-16 MED ORDER — SODIUM CHLORIDE 0.9 % IV SOLN
INTRAVENOUS | Status: DC
  Administered 2013-05-16 – 2013-05-17 (×2): 20 mL/h via INTRAVENOUS
  Administered 2013-05-20 (×2): via INTRAVENOUS

## 2013-05-16 MED ORDER — HEPARIN SODIUM (PORCINE) 1000 UNIT/ML IJ SOLN
INTRAMUSCULAR | Status: DC | PRN
  Administered 2013-05-16: 40 mL via INTRAVENOUS

## 2013-05-16 MED ORDER — PANTOPRAZOLE SODIUM 40 MG PO TBEC: 40.0000 mg | DELAYED_RELEASE_TABLET | Freq: Every day | ORAL | Status: DC

## 2013-05-16 MED ORDER — DEXTROSE 5 % IV SOLN
2.0000 ug/min | INTRAVENOUS | Status: DC
Start: 2013-05-16 — End: 2013-05-16
  Filled 2013-05-16: qty 4

## 2013-05-16 MED ORDER — DOPAMINE-DEXTROSE 3.2-5 MG/ML-% IV SOLN
0.0000 ug/kg/min | INTRAVENOUS | Status: DC
  Administered 2013-05-16: 3 ug/kg/min via INTRAVENOUS

## 2013-05-16 MED ORDER — LACTATED RINGERS IV SOLN
INTRAVENOUS | Status: DC | PRN
  Administered 2013-05-16: 08:00:00 via INTRAVENOUS

## 2013-05-16 MED ORDER — INSULIN REGULAR BOLUS VIA INFUSION
0.0000 [IU] | Freq: Three times a day (TID) | INTRAVENOUS | Status: DC
  Filled 2013-05-16: qty 10

## 2013-05-16 MED ORDER — DEXMEDETOMIDINE HCL IN NACL 400 MCG/100ML IV SOLN
0.4000 ug/kg/h | INTRAVENOUS | Status: DC
  Filled 2013-05-16: qty 100

## 2013-05-16 MED ORDER — DOCUSATE SODIUM 100 MG PO CAPS: 200.0000 mg | ORAL_CAPSULE | Freq: Every day | ORAL | Status: DC

## 2013-05-16 MED ORDER — OXYCODONE HCL 5 MG PO TABS: 5.0000 mg | ORAL_TABLET | ORAL | Status: DC | PRN

## 2013-05-16 MED ORDER — SODIUM CHLORIDE 0.9 % IV SOLN
10.0000 g | INTRAVENOUS | Status: DC | PRN
  Administered 2013-05-16: 5 g/h via INTRAVENOUS

## 2013-05-16 MED ORDER — SODIUM CHLORIDE 0.9 % IV SOLN
INTRAVENOUS | Status: AC
  Administered 2013-05-16 – 2013-05-17 (×2): 100 mL/h via INTRAVENOUS

## 2013-05-16 MED ORDER — ACETAMINOPHEN 160 MG/5ML PO SOLN
1000.0000 mg | Freq: Four times a day (QID) | ORAL | Status: DC
  Administered 2013-05-17 – 2013-05-19 (×8): 1000 mg
  Filled 2013-05-16 (×8): qty 40.6

## 2013-05-16 MED ORDER — SODIUM CHLORIDE 0.9 % IJ SOLN
INTRAMUSCULAR | Status: AC
  Filled 2013-05-16: qty 20

## 2013-05-16 MED ORDER — BISACODYL 10 MG RE SUPP
10.0000 mg | Freq: Every day | RECTAL | Status: DC
  Administered 2013-05-19: 10 mg via RECTAL
  Filled 2013-05-16 (×2): qty 1

## 2013-05-16 MED ORDER — MIDAZOLAM HCL 5 MG/5ML IJ SOLN
INTRAMUSCULAR | Status: DC | PRN
  Administered 2013-05-16: 1 mg via INTRAVENOUS
  Administered 2013-05-16 (×2): 2 mg via INTRAVENOUS
  Administered 2013-05-16: 5 mg via INTRAVENOUS
  Administered 2013-05-16: 4 mg via INTRAVENOUS

## 2013-05-16 MED ORDER — LACTATED RINGERS IV SOLN: 500.0000 mL | Freq: Once | INTRAVENOUS | Status: AC | PRN

## 2013-05-16 MED ORDER — MILRINONE IN DEXTROSE 20 MG/100ML IV SOLN
INTRAVENOUS | Status: DC | PRN
  Administered 2013-05-16: 0.375 ug/kg/min via INTRAVENOUS

## 2013-05-16 SURGICAL SUPPLY — 137 items
ADAPTER CARDIO PERF ANTE/RETRO (ADAPTER) ×4 IMPLANT
ADH SKN CLS APL DERMABOND .7 (GAUZE/BANDAGES/DRESSINGS) ×6
ADPR PRFSN 84XANTGRD RTRGD (ADAPTER) ×3
APL SKNCLS STERI-STRIP NONHPOA (GAUZE/BANDAGES/DRESSINGS) ×6
ATTRACTOMAT 16X20 MAGNETIC DRP (DRAPES) ×4 IMPLANT
BAG DECANTER FOR FLEXI CONT (MISCELLANEOUS) ×4 IMPLANT
BENZOIN TINCTURE PRP APPL 2/3 (GAUZE/BANDAGES/DRESSINGS) ×5 IMPLANT
BLADE STERNUM SYSTEM 6 (BLADE) ×1 IMPLANT
BLADE SURG 11 STRL SS (BLADE) ×7 IMPLANT
CANISTER SUCTION 2500CC (MISCELLANEOUS) ×8 IMPLANT
CANNULA FEM VENOUS REMOTE 22FR (CANNULA) ×1 IMPLANT
CANNULA FEMORAL ART 14 SM (MISCELLANEOUS) ×4 IMPLANT
CANNULA GUNDRY RCSP 15FR (MISCELLANEOUS) ×5 IMPLANT
CANNULA OPTISITE PERFUSION 16F (CANNULA) IMPLANT
CANNULA OPTISITE PERFUSION 18F (CANNULA) IMPLANT
CARDIAC SUCTION (MISCELLANEOUS) ×4 IMPLANT
CARDIOBLATE CARDIAC ABLATION (MISCELLANEOUS)
CATH KIT ON Q 5IN SLV (PAIN MANAGEMENT) IMPLANT
CONN ST 1/4X3/8  BEN (MISCELLANEOUS) ×2
CONN ST 1/4X3/8 BEN (MISCELLANEOUS) ×6 IMPLANT
CONNECTOR 1/2X3/8X1/2 3 WAY (MISCELLANEOUS) ×1
CONNECTOR 1/2X3/8X1/2 3WAY (MISCELLANEOUS) IMPLANT
CONT SPEC STER OR (MISCELLANEOUS) ×4 IMPLANT
COVER BACK TABLE 24X17X13 BIG (DRAPES) ×4 IMPLANT
COVER MAYO STAND STRL (DRAPES) ×4 IMPLANT
COVER SURGICAL LIGHT HANDLE (MISCELLANEOUS) ×7 IMPLANT
CRADLE DONUT ADULT HEAD (MISCELLANEOUS) ×4 IMPLANT
DERMABOND ADVANCED (GAUZE/BANDAGES/DRESSINGS) ×2
DERMABOND ADVANCED .7 DNX12 (GAUZE/BANDAGES/DRESSINGS) ×6 IMPLANT
DEVICE CARDIOBLATE CARDIAC ABL (MISCELLANEOUS) IMPLANT
DEVICE PMI PUNCTURE CLOSURE (MISCELLANEOUS) ×4 IMPLANT
DEVICE SUT CK QUICK LOAD INDV (Prosthesis & Implant Heart) ×4 IMPLANT
DEVICE SUT CK QUICK LOAD MINI (Prosthesis & Implant Heart) ×6 IMPLANT
DEVICE TROCAR PUNCTURE CLOSURE (ENDOMECHANICALS) ×4 IMPLANT
DRAIN CHANNEL 28F RND 3/8 FF (WOUND CARE) ×8 IMPLANT
DRAPE BILATERAL SPLIT (DRAPES) ×4 IMPLANT
DRAPE C-ARM 42X72 X-RAY (DRAPES) ×4 IMPLANT
DRAPE CV SPLIT W-CLR ANES SCRN (DRAPES) ×4 IMPLANT
DRAPE INCISE IOBAN 66X45 STRL (DRAPES) ×11 IMPLANT
DRAPE SLUSH/WARMER DISC (DRAPES) ×4 IMPLANT
DRSG COVADERM 4X8 (GAUZE/BANDAGES/DRESSINGS) ×4 IMPLANT
ELECT BLADE 6.5 EXT (BLADE) ×5 IMPLANT
ELECT REM PT RETURN 9FT ADLT (ELECTROSURGICAL) ×8
ELECTRODE REM PT RTRN 9FT ADLT (ELECTROSURGICAL) ×6 IMPLANT
FEMORAL VENOUS CANN RAP (CANNULA) IMPLANT
GLOVE BIO SURGEON STRL SZ 6 (GLOVE) ×2 IMPLANT
GLOVE BIO SURGEON STRL SZ 6.5 (GLOVE) ×1 IMPLANT
GLOVE BIOGEL PI IND STRL 6 (GLOVE) IMPLANT
GLOVE BIOGEL PI IND STRL 6.5 (GLOVE) IMPLANT
GLOVE BIOGEL PI IND STRL 7.0 (GLOVE) IMPLANT
GLOVE BIOGEL PI INDICATOR 6 (GLOVE) ×5
GLOVE BIOGEL PI INDICATOR 6.5 (GLOVE) ×5
GLOVE BIOGEL PI INDICATOR 7.0 (GLOVE) ×2
GLOVE ORTHO TXT STRL SZ7.5 (GLOVE) ×12 IMPLANT
GOWN STRL REUS W/ TWL LRG LVL3 (GOWN DISPOSABLE) ×12 IMPLANT
GOWN STRL REUS W/TWL LRG LVL3 (GOWN DISPOSABLE) ×32
GUIDEWIRE ANG ZIPWIRE 038X150 (WIRE) ×4 IMPLANT
INSERT CONFORM CROSS CLAMP 66M (MISCELLANEOUS) ×3 IMPLANT
INSERT CONFORM CROSS CLAMP 86M (MISCELLANEOUS) ×3 IMPLANT
IV NS 1000ML (IV SOLUTION) ×16
IV NS 1000ML BAXH (IV SOLUTION) IMPLANT
KIT BASIN OR (CUSTOM PROCEDURE TRAY) ×4 IMPLANT
KIT DEVICE SUT COR-KNOT MIS 5 (INSTRUMENTS) ×1 IMPLANT
KIT DILATOR VASC 18G NDL (KITS) ×4 IMPLANT
KIT DRAINAGE VACCUM ASSIST (KITS) ×1 IMPLANT
KIT ROOM TURNOVER OR (KITS) ×4 IMPLANT
KIT SUCTION CATH 14FR (SUCTIONS) ×5 IMPLANT
LEAD PACING MYOCARDI (MISCELLANEOUS) ×4 IMPLANT
LINE VENT (MISCELLANEOUS) ×1 IMPLANT
NDL AORTIC ROOT 14G 7F (CATHETERS) ×3 IMPLANT
NDL PERC 18GX7CM (NEEDLE) IMPLANT
NDL SUT 4 .5 CRC FRENCH EYE (NEEDLE) IMPLANT
NEEDLE AORTIC ROOT 14G 7F (CATHETERS) ×8 IMPLANT
NEEDLE FRENCH EYE (NEEDLE) ×4
NEEDLE PERC 18GX7CM (NEEDLE) ×4 IMPLANT
NS IRRIG 1000ML POUR BTL (IV SOLUTION) ×20 IMPLANT
PACK OPEN HEART (CUSTOM PROCEDURE TRAY) ×4 IMPLANT
PAD ARMBOARD 7.5X6 YLW CONV (MISCELLANEOUS) ×8 IMPLANT
PAD ELECT DEFIB RADIOL ZOLL (MISCELLANEOUS) ×4 IMPLANT
PATCH CORMATRIX 4CMX7CM (Prosthesis & Implant Heart) ×1 IMPLANT
PENCIL BUTTON HOLSTER BLD 10FT (ELECTRODE) ×1 IMPLANT
PROBE CRYO2-ABLATION MALLABLE (MISCELLANEOUS) IMPLANT
RETRACTOR TRL SOFT TISSUE LG (INSTRUMENTS) ×2 IMPLANT
RETRACTOR TRM SOFT TISSUE 7.5 (INSTRUMENTS) IMPLANT
RING MITRAL MEMO 3D 26MM SMD26 (Prosthesis & Implant Heart) ×1 IMPLANT
SET CANNULATION TOURNIQUET (MISCELLANEOUS) ×5 IMPLANT
SET CARDIOPLEGIA MPS 5001102 (MISCELLANEOUS) ×1 IMPLANT
SET IRRIG TUBING LAPAROSCOPIC (IRRIGATION / IRRIGATOR) ×4 IMPLANT
SOLUTION ANTI FOG 6CC (MISCELLANEOUS) ×4 IMPLANT
SPONGE GAUZE 4X4 12PLY (GAUZE/BANDAGES/DRESSINGS) ×5 IMPLANT
SPONGE GAUZE 4X4 12PLY STER LF (GAUZE/BANDAGES/DRESSINGS) ×1 IMPLANT
SUCKER WEIGHTED FLEX (MISCELLANEOUS) ×8 IMPLANT
SUT BONE WAX W31G (SUTURE) ×4 IMPLANT
SUT E-PACK MINIMALLY INVASIVE (SUTURE) ×4 IMPLANT
SUT ETHIBOND (SUTURE) ×3 IMPLANT
SUT ETHIBOND 2 0 SH (SUTURE) ×2 IMPLANT
SUT ETHIBOND 2 0 V4 (SUTURE) IMPLANT
SUT ETHIBOND 2 0V4 GREEN (SUTURE) IMPLANT
SUT ETHIBOND 2-0 RB-1 WHT (SUTURE) ×3 IMPLANT
SUT ETHIBOND 4 0 TF (SUTURE) IMPLANT
SUT ETHIBOND 5 0 C 1 30 (SUTURE) IMPLANT
SUT ETHIBOND NAB MH 2-0 36IN (SUTURE) IMPLANT
SUT ETHIBOND X763 2 0 SH 1 (SUTURE) ×4 IMPLANT
SUT GORETEX 6.0 TH-9 30 IN (SUTURE) IMPLANT
SUT GORETEX CV 4 TH 22 36 (SUTURE) ×4 IMPLANT
SUT GORETEX CV-5THC-13 36IN (SUTURE) IMPLANT
SUT GORETEX CV4 TH-18 (SUTURE) ×8 IMPLANT
SUT GORETEX TH-18 36 INCH (SUTURE) IMPLANT
SUT MNCRL AB 3-0 PS2 18 (SUTURE) ×6 IMPLANT
SUT PROLENE 3 0 SH1 36 (SUTURE) ×25 IMPLANT
SUT PROLENE 4 0 RB 1 (SUTURE) ×12
SUT PROLENE 4-0 RB1 .5 CRCL 36 (SUTURE) IMPLANT
SUT PROLENE 6 0 C 1 30 (SUTURE) ×4 IMPLANT
SUT SILK  1 MH (SUTURE) ×1
SUT SILK 1 MH (SUTURE) IMPLANT
SUT SILK 2 0 SH CR/8 (SUTURE) ×3 IMPLANT
SUT SILK 3 0 SH CR/8 (SUTURE) ×3 IMPLANT
SUT VIC AB 2-0 CTX 27 (SUTURE) ×1 IMPLANT
SUT VIC AB 2-0 CTX 36 (SUTURE) ×6 IMPLANT
SUT VIC AB 3-0 SH 27 (SUTURE) ×4
SUT VIC AB 3-0 SH 27X BRD (SUTURE) IMPLANT
SUT VIC AB 3-0 SH 8-18 (SUTURE) ×10 IMPLANT
SUT VICRYL 2 TP 1 (SUTURE) ×3 IMPLANT
SYRINGE 10CC LL (SYRINGE) ×4 IMPLANT
SYS ATRICLIP LAA EXCLUSION 45 (CLIP) IMPLANT
SYSTEM SAHARA CHEST DRAIN ATS (WOUND CARE) ×7 IMPLANT
TAPE CLOTH SURG 4X10 WHT LF (GAUZE/BANDAGES/DRESSINGS) ×1 IMPLANT
TOWEL OR 17X24 6PK STRL BLUE (TOWEL DISPOSABLE) ×5 IMPLANT
TOWEL OR 17X26 10 PK STRL BLUE (TOWEL DISPOSABLE) ×5 IMPLANT
TRAY FOLEY IC TEMP SENS 16FR (CATHETERS) ×4 IMPLANT
TROCAR XCEL BLADELESS 5X75MML (TROCAR) ×4 IMPLANT
TROCAR XCEL NON-BLD 11X100MML (ENDOMECHANICALS) ×8 IMPLANT
TUNNELER SHEATH ON-Q 11GX8 DSP (PAIN MANAGEMENT) IMPLANT
UNDERPAD 30X30 INCONTINENT (UNDERPADS AND DIAPERS) ×4 IMPLANT
VALVE MAGNA MITRAL 27MM (Prosthesis & Implant Heart) ×1 IMPLANT
WATER STERILE IRR 1000ML POUR (IV SOLUTION) ×8 IMPLANT
WIRE BENTSON .035X145CM (WIRE) ×4 IMPLANT

## 2013-05-16 NOTE — Anesthesia Preprocedure Evaluation (Addendum)
Anesthesia Evaluation  Patient identified by MRN, date of birth, ID band Patient awake    Reviewed: Allergy & Precautions, H&P , NPO status , Patient's Chart, lab work & pertinent test results  Airway Mallampati: II TM Distance: >3 FB Neck ROM: Full    Dental  (+) Teeth Intact, Dental Advisory Given   Pulmonary  + rhonchi   + decreased breath sounds      Cardiovascular hypertension, Rhythm:Regular Rate:Normal     Neuro/Psych    GI/Hepatic   Endo/Other    Renal/GU      Musculoskeletal   Abdominal   Peds  Hematology   Anesthesia Other Findings   Reproductive/Obstetrics                        Anesthesia Physical Anesthesia Plan  ASA: III  Anesthesia Plan: General   Post-op Pain Management:    Induction: Intravenous  Airway Management Planned: Double Lumen EBT  Additional Equipment: Arterial line, CVP, 3D TEE and PA Cath  Intra-op Plan:   Post-operative Plan:   Informed Consent: I have reviewed the patients History and Physical, chart, labs and discussed the procedure including the risks, benefits and alternatives for the proposed anesthesia with the patient or authorized representative who has indicated his/her understanding and acceptance.   Dental advisory given  Plan Discussed with: CRNA and Anesthesiologist  Anesthesia Plan Comments:         Anesthesia Quick Evaluation

## 2013-05-16 NOTE — Progress Notes (Signed)
Pt. Alert and oriented this am. No s/s of distress or discomfort noted. VSS.  Pt. Denies pain. Pt. surgical prep completed. Family at the bedside. Pt. Transported to OR via stretcher. Report given to receiving OR nurse. Haaris Metallo, [name]

## 2013-05-16 NOTE — OR Nursing (Signed)
18:30 - 2nd call to SICU.

## 2013-05-16 NOTE — Transfer of Care (Signed)
Immediate Anesthesia Transfer of Care Note  Patient: [name] [name]  Procedure(s) Performed: Procedure(s): MINIMALLY INVASIVE MAZE PROCEDURE (N/A) INTRAOPERATIVE TRANSESOPHAGEAL ECHOCARDIOGRAM (N/A) MINIMALLY INVASIVE MITRAL VALVE (MV) REPLACEMENT (Right)  Patient Location: SICU  Anesthesia Type:General  Level of Consciousness: sedated and Patient remains intubated per anesthesia plan  Airway & Oxygen Therapy: Patient remains intubated per anesthesia plan and Patient placed on Ventilator (see vital sign flow sheet for setting)  Post-op Assessment: Report given to PACU RN and Post -op Vital signs reviewed and stable  Post vital signs: Reviewed and stable  Complications: No apparent anesthesia complications

## 2013-05-16 NOTE — Progress Notes (Signed)
Pts. HR elevated and sustaining in the 110-120s. Pt. Asymptomatic. Resting in bed. On call MD, Madilyn Hook, made aware. No new orders received. RN will continue to monitor pt. For changes in condition. [name] [name], [name]

## 2013-05-16 NOTE — Progress Notes (Signed)
Pt. With 5 beat run of VTach. Pt. Resting in bed quietly. Pt. Denies pain or discomfort. VSS. Blood pressure 104/51, pulse 73, temperature 98 F (36.7 C), temperature source Oral, resp. rate 18, height 5\' 2"  (1.575 m), weight 84.052 kg (185 lb 4.8 oz), SpO2 96.00%. RN will continue to monitor pt. For changes in condition. Callum Wolf, [name]

## 2013-05-16 NOTE — Addendum Note (Signed)
Addendum created 05/16/13 2054 by Roberts Gaudy, MD   Modules edited: Clinical Notes   Clinical Notes:  File: [id number]; Pend: [id number]; Pend: [id number]; Pend: [id number]; Pend: [id number]; Pend: [id number]; Pend: [id number]; Pend: [id number]; Cliff: [id number]

## 2013-05-16 NOTE — Progress Notes (Signed)
Echocardiogram Echocardiogram Transesophageal has been performed.  Joelene Millin 05/16/2013, 10:17 AM

## 2013-05-16 NOTE — CV Procedure (Signed)
Intra-operative Transesophageal Echocardiography Report:  [name] [name] is a 73 year old female with remote history of rheumatic fever and a long standing history of chronic diastolic congestive heart failure. She recently experienced multiple episodes of acute congestive heart failure with recurrent paroxysmal atrial fibrillation. She was found to have severe mitral regurgitation. Cardiac catheterization revealed no significant coronary artery disease. She was also found to have mild to moderate aortic  insufficiency and normal left ventricular function. She is now scheduled to undergo mitral valve repair by Dr. Roxy Manns using the minimally invasive approach. Intraoperative transesophageal echocardiography was indicated to evaluate the mitral valve and to assist with repair, to evaluate for any other valvular pathology, to assess the right and  left ventricular function, and to serve as a monitor for intraoperative volume status.  The patient was brought to the operating room at [facility] and general anesthesia was induced without difficulty. Following endotracheal intubation and orogastric suctioning, the transesophageal echocardiography probe was inserted into the esophagus without difficulty.  Impression: Pre-bypass findings:  1. Mitral valve: The mitral leaflets were mildly thickened but opened without restriction. The papillary muscles appeared laterally displaced and the zone of coaptation was apically displaced. There was a central jet of mitral regurgitation which was judged as moderate. The vena contracta measured 0.45 cm and the regurgitant orifice area was 0.29 cm using the Pisa method. There were no flail or prolapsing mitral segments. There did not appear to be any significant mitral annular calcification or calcification of the sub-valvular apparatus.  2. Aortic valve: The aortic valve appeared bicuspid. There was a raphae between the right and left coronary cusps. The left  coronary cusp appeared smaller than the right and non-coronary cusps. There was a jet of aortic regurgitation which originated along the coaptation line. This was judged as mild to moderate. This was central and not eccentric. The pressure half-time of the deceleration slope was 720 ms.  3. The Left Ventricle: The LV cavity was enlarged. The ventricular end-diastolic diameter measured 60 mm at end diastole at the mid-papillary level in the transgastric short axis view. There was mild to moderate concentric left ventricular hypertrophy. Left ventricular wall thickness measured 10.5-11 mm at the mid-papillary level in the transgastric short axis view at end-diastole. There was vigorous contractility in all segments interrogated and ejection fraction was estimated at 60-65%. There were no regional wall motion abnormalities flow.  4. Right ventricle: There appeared to be a degree of right ventricular hypertrophy. The right ventricular free wall contracted normally and appeared to be normal right ventricular systolic function.  5. Tricuspid valve: The tricuspid valve appeared structurally unremarkable and there was 1+ tricuspid insufficiency.   6. Interatrial septum: The interatrial septum was intact without evidence of patent foramen ovale or atrial septal defect by color Doppler or bubble study.  7. Left atrium: There was mild left atrial enlargement. The left atrial cavity measured 4.68 cm in the superior inferior dimension and 4.27 cm in the mediolateral dimension. There was no thrombus noted in the left atrium or left atrial appendage.  8. Ascending aorta: The ascending aorta showed a well-defined aortic root and sinotubular ridge without effacement. There was moderate thickening of the intima but no significant atheromatous plaques appreciated.  9. Descending aorta: There were scattered grade 2-3 atheromatous plaques noted within the wall of the descending aorta. The descending aorta was not  dilated.  Post-bypass findings:  1. Mitral valve: Upon initial separation cardiopulmonary bypass, there was an annuloplasty ring present in the mitral  position. The anterior leaflet appeared to open normally. There was a central jet of mitral regurgitation which was judged as moderate and appeared not significantly changed from the pre-bypass study. Dr. Roxy Manns then elected to return to cardiopulmonary bypass and replace the mitral valve.  Upon separation from cardiopulmonary bypass the second time, there was a bioprosthetic valve in the mitral position. There was some restriction to opening of the leaflet in the area where the previous anterior leaflet had been. This appeared due to  an aortic insufficiency jet. The other leaflets appeared to open normally. There was no  mitral insufficiency noted. There was no perivalvular insufficiency noted. The transmitral gradient was 5 mm of mercury using the continuous wave Doppler.  2. Aortic valve: The aortic valve was examined and there was noted to be an increased amount of aortic insufficiency from the pre-bypass study. Again the aortic valve was noted to be bicuspid. There was an additional area of aortic insufficiency which appeared to originate from the commissure between the natural right coronary or coronary cusp and the noncoronary cusp. This resulted in the central jet of aortic insufficiency which contacted the leaflet of the bioprosthetic mitral valve in the area of the previous anterior leaflet. The mitral insufficiency was judged as moderate. The pressure half-time of the aortic insufficiency jet was 369 ms. There was no  holodiastolic flow reversal noted in the descending thoracic aorta.  3. Left ventricle: Left ventricular contractility appeared somewhat dyssynchronous due to ventricular pacing but there were no regional wall motion abnormalities  appreciated. Ejection fraction was estimated at 55-60%.  4. Right ventricle: The right ventricular  cavity showed some decreased contractility of the right ventricular free wall from the pre-bypass exam. There again appeared to be a degree of right ventricular hypertrophy present. There was no septal flattening. RV function was judged as mildly reduced.  5. Tricuspid valve: The tricuspid valve appeared unchanged from the pre-bypass study and there was 1+ tricuspid insufficiency.  Roberts Gaudy, M.D.

## 2013-05-16 NOTE — OR Nursing (Signed)
16:25 - volunteer desk called room requesting update for family - gave information per Dr. Roxy Manns.

## 2013-05-16 NOTE — Progress Notes (Signed)
Pt. HR sustaining 150s-160s. Pt. Alert and stable. Asymptomatic. Pt. Denies pain or discomfort. On call MD R. Donneta Romberg made aware. New orders received and implemented. RN will continue to monitor pt. For changes in condition.[name] [name], [name]

## 2013-05-16 NOTE — Anesthesia Postprocedure Evaluation (Signed)
  Anesthesia Post-op Note  Patient: [name] [name]  Procedure(s) Performed: Procedure(s): MINIMALLY INVASIVE MAZE PROCEDURE (N/A) INTRAOPERATIVE TRANSESOPHAGEAL ECHOCARDIOGRAM (N/A) MINIMALLY INVASIVE MITRAL VALVE (MV) REPLACEMENT (Right)  Patient Location: SICU  Anesthesia Type:General  Level of Consciousness: sedated and unresponsive  Airway and Oxygen Therapy: Patient remains intubated per anesthesia plan and Patient placed on Ventilator (see vital sign flow sheet for setting)  Post-op Pain: none  Post-op Assessment: Post-op Vital signs reviewed, Patient's Cardiovascular Status Stable, Respiratory Function Stable, Patent Airway and Pain level controlled  Post-op Vital Signs: stable  Complications: No apparent anesthesia complications

## 2013-05-16 NOTE — Brief Op Note (Addendum)
      [address]       [place] [address]             [phone]     04/28/2013 - 05/16/2013  1:55 PM  PATIENT:  [name] [name]  73 y.o. female  PRE-OPERATIVE DIAGNOSIS:  SEVERE MR  POST-OPERATIVE DIAGNOSIS:  severe mitral valve regurgitation  PROCEDURE:  Procedure(s): MINIMALLY INVASIVE MITRAL VALVE REPLACEMENT WITH EDWARDS #27 PERICARDIAL VALVE  MINIMALLY INVASIVE MAZE PROCEDURE INTRAOPERATIVE TRANSESOPHAGEAL ECHOCARDIOGRAM  SURGEON:    Rexene Alberts, MD  ASSISTANTS:  John Giovanni, PA-C  ANESTHESIA:   Roberts Gaudy, MD  CROSSCLAMP TIME:   209'  CARDIOPULMONARY BYPASS TIME: 337'  FINDINGS:  Degenerative and rheumatic mitral valve disease with primarily type I mitral valve dysfunction with some type IIIA dysfunction  Severe mitral regurgitation  Bicuspid native aortic valve with moderate (1+/2+) aortic insufficiency  Normal LV size and systolic function  Moderate LV hypertrophy  Normal RV size and systolic function  Residual moderate (2+) mitral regurgitation after 26 mm ring annuloplasty  Normal functioning bioprosthetic tissue mitral valve after valve replacement  Slightly worse moderate aortic insufficiency (2+) after mitral valve replacement   Mitral/Tricuspid/Pulmonary Valve Procedure  Mitral Valve Procedure Performed:  Replacement: Repair attempted prior to replacement.  Posterior Mitral Chords preserved.  Implant: Bioprosthetic Valve: Implant model number [id number], Size 27, Unique Device Identifier [id number].  Tricuspid Valve Procedure Performed:  N/A  Implant: N/A     Mitral Valve Etiology  MV Insufficiency: Severe  MV Disease: Yes.  MV Stenosis: No mitral valve stenosis.  MV Disease Functional Class: MV Disease Functional Class: Type I.  and Type IIIA  Etiology (Choose at least one and up to five): Degenerative and rheumatic  MV Lesions (Choose at least one): No additional lesions.   Maze  Procedure  Radiofrequency:  Yes.  Bipolar: Yes.  Cut-and-sew:  No.  Cryo: Yes  Lesions (select all that apply):    2   Box Lesion,   3a  Inferior Pulmonary Vein Connecting Lesion,   3b  Superior Pulmonary Vein Connecting Lesion,     4  Posterior Mirtal Annular Line,     6  Mitral Valve Cryo Lesion,     7  LAA Ligation/Removal,     9   Intercaval Line to Tricuspid Annulus ("T" Lesion),    11  Intercaval Line,    13  Tricuspid Cryo Lesion,   15a  RAA Lateral Wall (Short) and   15b  RAA Lateral Wall to "T" Lesion  COMPLICATIONS: None  BASELINE WEIGHT: 83 kg  PATIENT DISPOSITION:   TO SICU IN STABLE CONDITION  [name] [name] 05/16/2013 6:40 PM

## 2013-05-16 NOTE — Op Note (Signed)
CARDIOTHORACIC SURGERY OPERATIVE NOTE  Date of Procedure:   05/16/2013  Preoperative Diagnosis:   Severe Mitral Regurgitation  Recurrent Paroxysmal Atrial Fibrillation  Mild Aortic Insufficiency  Postoperative Diagnosis:   Severe Mitral Regurgitation  Recurrent Paroxysmal Atrial Fibrillation  Moderate Aortic Insufficiency  Procedure:    Minimally-Invasive Mitral Valve Replacement  Edwards Magna Mitral bovine bioprosthetic tissue valve (size 57mm, model #7300TFX, serial #[id number])   Maze Procedure  Complete bilateral atrial lesion set using cryothermy and bipolar radiofrequency ablation  Oversewing of Left Atrial Appendage  Surgeon: Valentina Gu. Roxy Manns, MD  Assistant: John Giovanni, PA-C  Anesthesia: Roberts Gaudy, MD  Operative Findings: Degenerative and rheumatic mitral valve disease with primarily type I mitral valve dysfunction with some type IIIA dysfunction  Severe mitral regurgitation  Bicuspid native aortic valve with moderate (1+/2+) aortic insufficiency  Normal LV size and systolic function  Moderate LV hypertrophy  Normal RV size and systolic function  Residual moderate (2+) mitral regurgitation after 26 mm ring annuloplasty  Normal functioning bioprosthetic tissue mitral valve after valve replacement  Slightly worse moderate aortic insufficiency (2+) after mitral valve replacement             BRIEF CLINICAL NOTE AND INDICATIONS FOR SURGERY  Patient is a 73 year old obese widowed white female from [place] with remote history of rheumatic fever during childhood and long-standing history of chronic diastolic congestive heart failure who has recently experienced multiple recurrent episodes of acute exacerbation of chronic diastolic congestive heart failure. She has been diagnosed with severe, symptomatic mitral regurgitation with recurrent paroxysmal atrial fibrillation and has now been referred for surgical consultation. The patient states that she was  treated for rheumatic fever at age 9. She has a long history of a heart murmur and reported occasional history of irregular heartbeat. She has been treated for chronic diastolic congestive heart failure with bilateral lower extremity edema and exertional shortness of breath for several years. In early December 2014 she was hospitalized at [facility] in [place] with an acute exacerbation of chronic diastolic congestive heart failure. She was diagnosed with paroxysmal atrial fibrillation at that time and started on metoprolol. Since then she has been followed by Dr. Harl Bowie as an outpatient, and she has been hospitalized four additional times over the past 2 months with recurrent paroxysmal atrial fibrillation and acute exacerbations of chronic diastolic congestive heart failure. In January she underwent transesophageal echocardiogram on 04/06/2013 demonstrating severe mitral regurgitation with mild to moderate aortic insufficiency and normal left ventricular systolic function. She was treated medically, but she was readmitted to the hospital 04/28/2013 with another acute exacerbation of congestive heart failure. During this hospitalization she has again experienced recurrent episodes of paroxysmal atrial fibrillation as well as resting shortness of breath and pulmonary edema. She underwent left and right heart catheterization on 05/04/2013. This revealed normal coronary arteries with no significant coronary artery disease. There was severe pulmonary hypertension with large V waves consistent with severe mitral regurgitation. Cardiothoracic surgical consultation has been requested.  The patient has been seen in consultation and counseled at length regarding the indications, risks and potential benefits of surgery.  All questions have been answered, and the patient provides full informed consent for the operation as described.       DETAILS OF THE OPERATIVE PROCEDURE  Preparation:  The patient is brought  to the operating room on the above mentioned date and central monitoring was established by the anesthesia team including placement of Swan-Ganz catheter through the left internal jugular vein.  A radial arterial line is placed. The patient is placed in the supine position on the operating table.  Intravenous antibiotics are administered. General endotracheal anesthesia is induced uneventfully. The patient is initially intubated using a dual lumen endotracheal tube.  A Foley catheter is placed.  Baseline transesophageal echocardiogram was performed.  Findings were notable for what appeared to be primarily type I mitral valve dysfunction with annular dilatation and fairly normal leaflet mobility. There was mild thickening of the leaflets are mildly restriction, but overall both leaflets appeared to be moving fairly well. There was a broad central jet of regurgitation. The aortic valve appeared to be bicuspid with a fused raphe and a somewhat elliptical opening. There was mild (1+/2+) aortic insufficiency.  There was no aortic stenosis. The aortic root size was normal. There was normal left ventricular size and systolic function. There was mild to moderate left ventricular hypertrophy. Right ventricular size and systolic function was normal. There was only trace tricuspid regurgitation.  A soft roll is placed behind the patient's left scapula and the neck gently extended and turned to the left.   The patient's right neck, chest, abdomen, both groins, and both lower extremities are prepared and draped in a sterile manner. A time out procedure is performed.   Surgical Approach:  A right miniature anterolateral thoracotomy incision is performed. The incision is placed just lateral to and superior to the right nipple. The pectoralis major muscle is retracted medially and completely preserved. The right pleural space is entered through the 3rd intercostal space. A soft tissue retractor is placed.  Two 11 mm ports  are placed through separate stab incisions inferiorly. The right pleural space is insufflated continuously with carbon dioxide gas through the posterior port during the remainder of the operation.  A pledgeted sutures placed through the dome of the right hemidiaphragm and retracted inferiorly to facilitate exposure.  A longitudinal incision is made in the pericardium 3 cm anterior to the phrenic nerve and silk traction sutures are placed on either side of the incision for exposure.   Extracorporeal Cardiopulmonary Bypass and Myocardial Protection:  A small incision is made in the right inguinal crease and the anterior surface of the right common femoral artery and right common femoral vein are identified.  The patient is placed in Trendelenburg position. The right internal jugular vein is cannulated with Seldinger technique and a guidewire advanced into the right atrium. The patient is heparinized systemically. The right internal jugular vein is cannulated with a 14 [place] pediatric femoral venous cannula. Pursestring sutures are placed on the anterior surface of the right common femoral vein and right common femoral artery. The right common femoral vein is cannulated with the Seldinger technique and a guidewire is advanced under transesophageal echocardiogram guidance through the right atrium. The femoral vein is cannulated with a long 22 French femoral venous cannula. The right common femoral artery is cannulated with Seldinger technique and a flexible guidewire is advanced until it can be appreciated intraluminally in the descending thoracic aorta on transesophageal echocardiogram. The femoral artery is cannulated with an 18 French femoral arterial cannula.  Adequate heparinization is verified.      The entire pre-bypass portion of the operation was notable for stable hemodynamics.  Cardiopulmonary bypass was begun.  Vacuum assist venous drainage is utilized. The incision in the pericardium is extended  in both directions. Venous drainage and exposure are notably excellent.   A retrograde cardioplegia cannula is placed through the right atrium into the coronary sinus  using transesophageal echocardiogram guidance.  An antegrade cardioplegia cannula is placed in the ascending aorta.  The patient is cooled to 28C systemic temperature.  The aortic cross clamp is applied and cold blood cardioplegia is delivered initially in an antegrade fashion through the aortic root.  However, a left ventricular chamber enlargement and elevated pulmonary artery pressures develops fairly quickly because of the aortic insufficiency. Subsequently, cardioplegia is given retrograde through the coronary sinus catheter. The initial cardioplegic arrest is rapid with early diastolic arrest.  Repeat doses of cardioplegia are administered intermittently every 20 to 30 minutes throughout the entire cross clamp portion of the operation through the aortic root and through the coronary sinus catheter in order to maintain completely flat electrocardiogram.  Myocardial protection was felt to be excellent.   Maze Procedure (left atrial lesion set):  Following placement of the aortic crossclamp and the administration of the initial arresting dose of cardioplegia, a left atriotomy incision was performed through the interatrial groove and extended partially across the back wall of the left atrium after opening the oblique sinus inferiorly.  The mitral valve and floor of the left atrium are exposed using a self-retaining retractor.    The Atricure CryoICE nitrous oxide cryothermy system is utilized for all cryothermy ablation lesions.  The left atrial lesion set of the Cox cryomaze procedure is now performed using 3 minute duration for all cryothermy lesions.  Initially a lesion is placed along the endocardial surface of the left atrium from the caudad apex of the atriotomy incision across the posterior wall of the left atrium onto the posterior  mitral annulus.  A mirror image lesion along the epicardial surface is then performed with the probe posterior to the left atrium, crossing over the coronary sinus.  Two lesions are then performed to create a box isolating all of the pulmonary veins from the remainder of the left atrium.  The first lesion is placed from the cephalad apex of the atriotomy incision across the dome of the left atrium to just anterior to the left sided pulmonary veins.  The second lesion completes the box from the caudad apex of the atriotomy incision across the back wall of the left atrium to connect with the previous lesion just anterior to the left sided pulmonary veins.     Mitral Valve Repair:  The mitral valve was inspected and notable for mildly thickened leaflets with fairly normal leaflet mobility. There was no foreshortening of the subvalvular apparatus and overall the valve appeared to be primarily degenerative rather than rheumatic in etiology. It appears that ring annuloplasty would likely be sufficient.   Interrupted 2-0 Ethibond horizontal mattress sutures are placed circumferentially around the entire mitral valve annulus.  The valve is sized to accept a 4mm annuloplasty ring based upon the distance between the left and right commissures, the height and the surface area of the anterior leaflet.  This size corresponds fairly closely to the overall size of the anterior leaflet and it certainly is not associated with any downsizing. However, it is felt that placement of a 24 mm ring would provide increased surface area of coaptation but also likely result in significant mitral stenosis. A 26 mm ring is chosen.  A Sorin Memo 3D annuloplasty ring (size 29mm, catalog # [id number], serial # [id number]) is implanted uneventfully.  The valve is again tested with saline and appears to be competent with a symmetrical line of coaptation of the anterior and posterior leaflet. There is mild residual leak. Rewarming is begun.  The  left atrial appendage was oversewn from within the left atrium using a 2 layer closure of running 3-0 Prolene suture.  The atriotomy was closed using a 2-layer closure of running 3-0 Prolene suture after placing a sump drain across the mitral valve to serve as a left ventricular vent.  One final dose of warm retrograde "hot shot" cardioplegia was administered retrograde through the coronary sinus catheter while all air was evacuated through the aortic root.  The aortic cross clamp was removed after a total cross clamp time of 106 minutes.   Maze Procedure (right atrial lesion set):  The retrograde cardioplegia cannula was removed and the small hole in the right atrium extended a short distance.  The AtriCure Synergy bipolar radiofrequency ablation clamp is utilized to create a series of linear lesions in the right atrium, each with one limb of the clamp along the endocardial surface and the other along the epicardial surface. The first lesion is placed from the posterior apex of the atriotomy incision and along the lateral wall of the right atrium to reach the lateral aspect of the superior vena cava. A second lesion is placed in the opposite direction from the posterior apex of the atriotomy incision along the lateral wall to reach the lateral aspect of the inferior vena cava. A third lesion is placed from the midportion of the atriotomy incision extending at a right angle to reach the tip of the right atrial appendage. A fourth lesion is placed from the anterior apex of the atriotomy incision in an anterior and inferior direction to reach the acute margin of the heart. Finally, the cryotherapy probe is utilized to complete the right atrial lesion set by placing the probe along the endocardial surface of the right atrium from the anterior apex of the atriotomy incision to reach the tricuspid annulus at the 2:00 position. The atriotomy incision is closed with a 2 layer closure of running 4-0 Prolene  suture.   Mitral Valve Replacement:  Epicardial pacing wires are fixed to the inferior wall of the right ventricule and to the right atrial appendage. The patient is rewarmed to 37C temperature. The left ventricular vent is removed.  The antegrade cardioplegia cannula is now removed. The patient is weaned and disconnected from cardiopulmonary bypass.  The patient's rhythm at separation from bypass was AV paced.  The patient was weaned from bypass without any inotropic support. Total cardiopulmonary bypass time for the operation was 169 minutes.  Followup transesophageal echocardiogram performed after separation from bypass revealed a well-seated annuloplasty ring in the mitral position with a normal functioning mitral valve. However, there was moderate (2+) residual mitral regurgitation.  The jet of regurgitation remained central and appears to reflect inadequate downsizing of the ring annuloplasty.   Left ventricular function was unchanged from preoperatively.  Cardiopulmonary bypass was resumed. A retrograde cardioplegia cannula is replaced through the right atrium into the coronary sinus using transesophageal echocardiogram guidance. An antegrade cardioplegia cannula is placed directly in the aorta.  The aortic cross clamp is applied and cold blood cardioplegia is delivered initially in an antegrade fashion through the aortic root.  Once ventricular fibrillation resumes cardioplegia is given retrograde through the coronary sinus catheter. The initial cardioplegic arrest is rapid with early diastolic arrest.  Repeat doses of cardioplegia are administered intermittently every 20 to 30 minutes throughout the entire cross clamp portion of the operation through the aortic root and through the coronary sinus catheter in order to maintain completely flat electrocardiogram.  Myocardial  protection was again felt to be excellent.  A left atriotomy was reopened. The valve was inspected. The ring was seated  appropriately and there is normal leaflet mobility. The ring appears to be associated with inadequate downsizing. The ring is removed. A decision is made to replace her valve rather than attempt downsizing further with a 24 mm ring.  The anterior leaflet of the mitral valve is excised. The posterior leaflet split in the midline to preserve all of the subvalvular apparatus to the posterior leaflet. Interrupted 2-0 Ethibond horizontal mattress pledgeted sutures are placed circumferentially around the entire mitral valve annulus. The valve was sized to accept a 27 mm stented bioprosthetic tissue valve. Mitral valve replacement is performed using an [facility] Mitral bovine bioprosthetic tissue valve (size 27 mm, model #7300TFX, serial [id number]) is implanted uneventfully.  The valve was tested with saline and appears to be seated appropriately with normal leaflet mobility. The commissural posts are appropriately oriented away from the left ventricular outflow tract.  The atriotomy was closed using a 2-layer closure of running 3-0 Prolene suture after placing a sump drain across the mitral valve to serve as a left ventricular vent.  One final dose of warm retrograde "hot shot" cardioplegia was administered retrograde through the coronary sinus catheter while all air was evacuated through the aortic root.  The aortic cross clamp was removed after a total second cross clamp time of 103 minutes, such that the combined total was 209 minutes.   Procedure Completion:  The patient is rewarmed to 37C temperature. Low dose milrinone and dopamine infusions were begun.  The left ventricular vent is removed.  The antegrade and retrograde cardioplegia cannulae are both removed. The patient is weaned and disconnected from cardiopulmonary bypass.  The patient's rhythm at separation from bypass was AV paced.  The patient was weaned from bypass without any inotropic support. Total cardiopulmonary bypass time for the second  portion was 168 minutes, such that the combined total for the entire operation was 337 minutes.  Followup transesophageal echocardiogram performed after separation from bypass revealed a well-seated bioprosthetic tissue valve in the mitral position with a normal functioning mitral valve. There was no paravalvular leak and trace central mitral regurgitation.   Left ventricular function remained normal.  There was moderate aortic insufficiency and the aortic insufficiency appeared slightly worse than it did at the beginning of the procedure with an eccentric jet blowing along the ventricular surface of the mitral prosthesis. However, on careful inspection there is no sign of any leaflet perforation or snaring of the aortic valve related to mitral valve sutures and the jet of regurgitation is clearly at the commissure. The jet of regurgitation does not fill the left ventricle and does not appear to be hemodynamically significant.  The femoral arterial and venous cannulae were removed uneventfully. There was a palpable pulse in the distal right common femoral artery after removal of the cannula. Protamine was administered to reverse the anticoagulation. The right internal jugular cannula was removed and manual pressure held on the neck for 15 minutes.  Single lung ventilation was begun. The atriotomy closure was inspected for hemostasis. The pericardial sac was drained using a 28 French Bard drain placed through the anterior port incision.  The pericardium was closed using a patch of cor matrix bovine submucosal tissue patch. The right pleural space is irrigated with saline solution and inspected for hemostasis. The right pleural space was drained using a 28 French Bard drain placed through the posterior port incision  and a 71 French chest tube placed through a separate stab incision. The miniature thoracotomy incision was closed in multiple layers in routine fashion. The right groin incision was inspected for  hemostasis and closed in multiple layers in routine fashion.  The post-bypass portion of the operation was notable for stable rhythm and hemodynamics.  The patient received 3 units packed red blood cells during the procedure due to anemia which was present preoperatively and exacerbated by acute blood loss and hemodilution during cardiopulmonary bypass.  The patient received a total of 2 packs adult platelets, 4 units fresh frozen plasma and 1 pack of cryoprecipitate due to coagulopathy and thrombocytopenia after separation from cardiopulmonary bypass and reversal of heparin with protamine.   Disposition:  The patient tolerated the procedure well.  The patient was reintubated using a single lumen endotracheal tube and subsequently transported to the surgical intensive care unit in stable condition. There were no intraoperative complications. All sponge instrument and needle counts are verified correct at completion of the operation.    Valentina Gu. Roxy Manns MD 05/16/2013 6:53 PM

## 2013-05-16 NOTE — OR Nursing (Signed)
17:45 - 1st call to SICU.

## 2013-05-17 ENCOUNTER — Inpatient Hospital Stay (HOSPITAL_COMMUNITY): Payer: Medicare HMO

## 2013-05-17 ENCOUNTER — Encounter (HOSPITAL_COMMUNITY): Payer: Self-pay | Admitting: Thoracic Surgery (Cardiothoracic Vascular Surgery)

## 2013-05-17 ENCOUNTER — Encounter (HOSPITAL_COMMUNITY): Payer: Medicare HMO | Admitting: Certified Registered"

## 2013-05-17 ENCOUNTER — Inpatient Hospital Stay (HOSPITAL_COMMUNITY): Payer: Medicare HMO | Admitting: Certified Registered"

## 2013-05-17 LAB — POCT I-STAT 3, ART BLOOD GAS (G3+)
ACID-BASE DEFICIT: 2 mmol/L (ref 0.0–2.0)
Acid-base deficit: 1 mmol/L (ref 0.0–2.0)
Acid-base deficit: 2 mmol/L (ref 0.0–2.0)
Acid-base deficit: 2 mmol/L (ref 0.0–2.0)
Acid-base deficit: 2 mmol/L (ref 0.0–2.0)
Acid-base deficit: 2 mmol/L (ref 0.0–2.0)
BICARBONATE: 26 meq/L — AB (ref 20.0–24.0)
Bicarbonate: 23.9 mEq/L (ref 20.0–24.0)
Bicarbonate: 25.2 mEq/L — ABNORMAL HIGH (ref 20.0–24.0)
Bicarbonate: 22.6 mEq/L (ref 20.0–24.0)
Bicarbonate: 24.4 mEq/L — ABNORMAL HIGH (ref 20.0–24.0)
Bicarbonate: 25.4 mEq/L — ABNORMAL HIGH (ref 20.0–24.0)
Bicarbonate: 24.7 mEq/L — ABNORMAL HIGH (ref 20.0–24.0)
O2 SAT: 94 %
O2 SAT: 86 %
O2 Saturation: 94 %
O2 Saturation: 95 %
O2 Saturation: 86 %
O2 Saturation: 93 %
O2 Saturation: 96 %
PCO2 ART: 62.7 mmHg — AB (ref 35.0–45.0)
PCO2 ART: 47.4 mmHg — AB (ref 35.0–45.0)
PCO2 ART: 54.9 mmHg — AB (ref 35.0–45.0)
PCO2 ART: 40.3 mmHg (ref 35.0–45.0)
PH ART: 7.222 — AB (ref 7.350–7.450)
PH ART: 7.368 (ref 7.350–7.450)
PH ART: 7.322 — AB (ref 7.350–7.450)
PH ART: 7.395 (ref 7.350–7.450)
PO2 ART: 82 mmHg (ref 80.0–100.0)
Patient temperature: 36.4
Patient temperature: 37.4
Patient temperature: 37.3
Patient temperature: 36.8
Patient temperature: 37.1
TCO2: 28 mmol/L (ref 0–100)
TCO2: 25 mmol/L (ref 0–100)
TCO2: 27 mmol/L (ref 0–100)
TCO2: 24 mmol/L (ref 0–100)
TCO2: 26 mmol/L (ref 0–100)
TCO2: 27 mmol/L (ref 0–100)
TCO2: 26 mmol/L (ref 0–100)
pCO2 arterial: 41.4 mmHg (ref 35.0–45.0)
pCO2 arterial: 58 mmHg (ref 35.0–45.0)
pCO2 arterial: 35.5 mmHg (ref 35.0–45.0)
pH, Arterial: 7.246 — ABNORMAL LOW (ref 7.350–7.450)
pH, Arterial: 7.413 (ref 7.350–7.450)
pH, Arterial: 7.273 — ABNORMAL LOW (ref 7.350–7.450)
pO2, Arterial: 74 mmHg — ABNORMAL LOW (ref 80.0–100.0)
pO2, Arterial: 61 mmHg — ABNORMAL LOW (ref 80.0–100.0)
pO2, Arterial: 71 mmHg — ABNORMAL LOW (ref 80.0–100.0)
pO2, Arterial: 74 mmHg — ABNORMAL LOW (ref 80.0–100.0)
pO2, Arterial: 59 mmHg — ABNORMAL LOW (ref 80.0–100.0)
pO2, Arterial: 80 mmHg (ref 80.0–100.0)

## 2013-05-17 LAB — PREPARE PLATELET PHERESIS
Unit division: 0
Unit division: 0

## 2013-05-17 LAB — PREPARE FRESH FROZEN PLASMA
UNIT DIVISION: 0
UNIT DIVISION: 0
Unit division: 0
Unit division: 0

## 2013-05-17 LAB — CBC
HCT: 27 % — ABNORMAL LOW (ref 36.0–46.0)
HCT: 27.2 % — ABNORMAL LOW (ref 36.0–46.0)
HEMOGLOBIN: 9.4 g/dL — AB (ref 12.0–15.0)
Hemoglobin: 9.3 g/dL — ABNORMAL LOW (ref 12.0–15.0)
MCH: 31.5 pg (ref 26.0–34.0)
MCH: 31.3 pg (ref 26.0–34.0)
MCHC: 34.4 g/dL (ref 30.0–36.0)
MCHC: 34.6 g/dL (ref 30.0–36.0)
MCV: 91.5 fL (ref 78.0–100.0)
MCV: 90.7 fL (ref 78.0–100.0)
PLATELETS: 182 10*3/uL (ref 150–400)
Platelets: 156 10*3/uL (ref 150–400)
RBC: 2.95 MIL/uL — ABNORMAL LOW (ref 3.87–5.11)
RBC: 3 MIL/uL — ABNORMAL LOW (ref 3.87–5.11)
RDW: 15.8 % — AB (ref 11.5–15.5)
RDW: 15.7 % — ABNORMAL HIGH (ref 11.5–15.5)
WBC: 15.8 10*3/uL — AB (ref 4.0–10.5)
WBC: 16.3 10*3/uL — ABNORMAL HIGH (ref 4.0–10.5)

## 2013-05-17 LAB — BASIC METABOLIC PANEL
BUN: 21 mg/dL (ref 6–23)
CO2: 25 mEq/L (ref 19–32)
Calcium: 8 mg/dL — ABNORMAL LOW (ref 8.4–10.5)
Chloride: 103 mEq/L (ref 96–112)
Creatinine, Ser: 1.07 mg/dL (ref 0.50–1.10)
GFR, EST AFRICAN AMERICAN: 59 mL/min — AB (ref >90)
GFR, EST NON AFRICAN AMERICAN: 51 mL/min — AB (ref >90)
Glucose, Bld: 114 mg/dL — ABNORMAL HIGH (ref 70–99)
POTASSIUM: 4.2 meq/L (ref 3.7–5.3)
SODIUM: 143 meq/L (ref 137–147)

## 2013-05-17 LAB — CREATININE, SERUM
CREATININE: 1.27 mg/dL — AB (ref 0.50–1.10)
GFR calc non Af Amer: 41 mL/min — ABNORMAL LOW (ref >90)
GFR, EST AFRICAN AMERICAN: 48 mL/min — AB (ref >90)

## 2013-05-17 LAB — GLUCOSE, CAPILLARY
GLUCOSE-CAPILLARY: 168 mg/dL — AB (ref 70–99)
GLUCOSE-CAPILLARY: 175 mg/dL — AB (ref 70–99)
GLUCOSE-CAPILLARY: 118 mg/dL — AB (ref 70–99)
GLUCOSE-CAPILLARY: 104 mg/dL — AB (ref 70–99)
GLUCOSE-CAPILLARY: 105 mg/dL — AB (ref 70–99)
GLUCOSE-CAPILLARY: 112 mg/dL — AB (ref 70–99)
GLUCOSE-CAPILLARY: 98 mg/dL (ref 70–99)
GLUCOSE-CAPILLARY: 90 mg/dL (ref 70–99)
GLUCOSE-CAPILLARY: 109 mg/dL — AB (ref 70–99)
Glucose-Capillary: 102 mg/dL — ABNORMAL HIGH (ref 70–99)
Glucose-Capillary: 111 mg/dL — ABNORMAL HIGH (ref 70–99)
Glucose-Capillary: 111 mg/dL — ABNORMAL HIGH (ref 70–99)
Glucose-Capillary: 114 mg/dL — ABNORMAL HIGH (ref 70–99)
Glucose-Capillary: 116 mg/dL — ABNORMAL HIGH (ref 70–99)
Glucose-Capillary: 116 mg/dL — ABNORMAL HIGH (ref 70–99)
Glucose-Capillary: 121 mg/dL — ABNORMAL HIGH (ref 70–99)
Glucose-Capillary: 130 mg/dL — ABNORMAL HIGH (ref 70–99)
Glucose-Capillary: 154 mg/dL — ABNORMAL HIGH (ref 70–99)
Glucose-Capillary: 160 mg/dL — ABNORMAL HIGH (ref 70–99)
Glucose-Capillary: 164 mg/dL — ABNORMAL HIGH (ref 70–99)
Glucose-Capillary: 95 mg/dL (ref 70–99)
Glucose-Capillary: 95 mg/dL (ref 70–99)

## 2013-05-17 LAB — PREPARE CRYOPRECIPITATE
UNIT DIVISION: 0
Unit division: 0

## 2013-05-17 LAB — MAGNESIUM
Magnesium: 3.1 mg/dL — ABNORMAL HIGH (ref 1.5–2.5)
Magnesium: 2.8 mg/dL — ABNORMAL HIGH (ref 1.5–2.5)

## 2013-05-17 MED ORDER — CHLORHEXIDINE GLUCONATE 0.12 % MT SOLN
OROMUCOSAL | Status: AC
  Administered 2013-05-17: 15 mL via OROMUCOSAL
  Filled 2013-05-17: qty 15

## 2013-05-17 MED ORDER — PROPOFOL 10 MG/ML IV BOLUS
INTRAVENOUS | Status: DC | PRN
  Administered 2013-05-17: 50 mg via INTRAVENOUS

## 2013-05-17 MED ORDER — FUROSEMIDE 10 MG/ML IJ SOLN
12.0000 mg/h | INTRAVENOUS | Status: DC
  Administered 2013-05-17: 8 mg/h via INTRAVENOUS
  Administered 2013-05-18 – 2013-05-21 (×4): 12 mg/h via INTRAVENOUS
  Filled 2013-05-17 (×9): qty 25

## 2013-05-17 MED FILL — Sodium Chloride IV Soln 0.9%: INTRAVENOUS | Qty: 2000 | Status: AC

## 2013-05-17 MED FILL — Heparin Sodium (Porcine) Inj 1000 Unit/ML: INTRAMUSCULAR | Qty: 30 | Status: AC

## 2013-05-17 MED FILL — Sodium Bicarbonate IV Soln 8.4%: INTRAVENOUS | Qty: 50 | Status: AC

## 2013-05-17 MED FILL — Dexmedetomidine HCl IV Soln 200 MCG/2ML: INTRAVENOUS | Qty: 2 | Status: AC

## 2013-05-17 MED FILL — Electrolyte-R (PH 7.4) Solution: INTRAVENOUS | Qty: 5000 | Status: AC

## 2013-05-17 MED FILL — Lidocaine HCl IV Inj 20 MG/ML: INTRAVENOUS | Qty: 2 | Status: AC

## 2013-05-17 MED FILL — Potassium Chloride Inj 2 mEq/ML: INTRAVENOUS | Qty: 40 | Status: AC

## 2013-05-17 MED FILL — Sodium Chloride IV Soln 0.9%: INTRAVENOUS | Qty: 5000 | Status: AC

## 2013-05-17 MED FILL — Mannitol IV Soln 20%: INTRAVENOUS | Qty: 500 | Status: AC

## 2013-05-17 MED FILL — Magnesium Sulfate Inj 50%: INTRAMUSCULAR | Qty: 10 | Status: AC

## 2013-05-17 NOTE — Anesthesia Procedure Notes (Signed)
Procedure Name: Intubation Date/Time: 05/17/2013 4:58 PM Performed by: Melina Copa, Addalyne Vandehei R Pre-anesthesia Checklist: Patient identified, Emergency Drugs available, Suction available, Patient being monitored and Timeout performed Patient Re-evaluated:Patient Re-evaluated prior to inductionOxygen Delivery Method: Ambu bag Preoxygenation: Pre-oxygenation with 100% oxygen Intubation Type: IV induction Ventilation: Mask ventilation without difficulty Laryngoscope Size: Mac and 3 Grade View: Grade III Tube type: Subglottic suction tube Tube size: 8.0 mm Number of attempts: 1 Airway Equipment and Method: Stylet Placement Confirmation: ETT inserted through vocal cords under direct vision,  CO2 detector and breath sounds checked- equal and bilateral Secured at: 23 cm Tube secured with: Tape Dental Injury: Teeth and Oropharynx as per pre-operative assessment

## 2013-05-17 NOTE — Progress Notes (Signed)
INITIAL NUTRITION ASSESSMENT  DOCUMENTATION CODES Per approved criteria  -Morbid Obesity   INTERVENTION:  If started, recommend Vital HP formula -- initiate at 20 ml/hr and increase by 10 ml every 4 hours to goal rate of 50 ml/hr to provide 1200 kcals (24 kcals/kg ideal body weight), 105 gm protein (100% of estimated protein needs), 1003 ml of free water RD to follow for nutrition care plan  NUTRITION DIAGNOSIS: Inadequate oral intake related to inability to eat as evidenced by NPO status  Goal: If started, EN to provide 60-70% of estimated calorie needs (22-25 kcals/kg ideal body weight) and 100% of estimated protein needs, based on ASPEN guidelines for permissive underfeeding in critically ill obese individuals  Monitor:  TF initiation & tolerance, respiratory status, weight, labs, I/O's  Reason for Assessment: VDRF  73 y.o. female  Admitting Dx: S/P mitral valve replacement with bioprosthetic valve  ASSESSMENT: Patient admitted with shortness of breath; s/p cardiac cath 2/27; + severe mitral regurgitation; decision made to proceed with surgical intervention.  Patient s/p procedures 3/12: MINIMALLY INVASIVE MAZE PROCEDURE  INTRAOPERATIVE TRANSESOPHAGEAL ECHOCARDIOGRAM  MINIMALLY INVASIVE MITRAL VALVE REPLACEMENT   Patient transferred from 3E-CHF to 2S-Surgical ICU post-op.  Patient is currently intubated on ventilator support -- OGT in place MV: 8.0 L/min Temp (24hrs), Avg:97 F (36.1 C), Min:95.5 F (35.3 C), Max:98.6 F (37 C)   If prolonged intubation expected, recommend nutrition support initiation within next 24-48 hours.  Height: Ht Readings from Last 1 Encounters:  05/16/13 5\' 2"  (1.575 m)    Weight: Wt Readings from Last 1 Encounters:  05/17/13 219 lb 9.3 oz (99.6 kg)    Ideal Body Weight: 110 lb  % Ideal Body Weight: 199%  Wt Readings from Last 10 Encounters:  05/17/13 219 lb 9.3 oz (99.6 kg)  05/17/13 219 lb 9.3 oz (99.6 kg)  05/17/13 219 lb  9.3 oz (99.6 kg)  04/25/13 196 lb 11.2 oz (89.223 kg)  04/06/13 195 lb (88.451 kg)  04/06/13 195 lb (88.451 kg)  03/20/13 195 lb (88.451 kg)  02/14/13 191 lb (86.637 kg)  02/15/11 185 lb (83.915 kg)    Usual Body Weight: 196 lb  % Usual Body Weight: 111%  BMI:  Body mass index is 40.15 kg/(m^2).  Estimated Nutritional Needs: Kcal: 1534 Protein: 100-110 gm Fluid: per MD  Skin: Stage I pressure ulcer to buttocks, surgical incisions   Diet Order: NPO  EDUCATION NEEDS: -No education needs identified at this time   Intake/Output Summary (Last 24 hours) at 05/17/13 1109 Last data filed at 05/17/13 1021  Gross per 24 hour  Intake 10894.5 ml  Output   6580 ml  Net 4314.5 ml    Labs:   Recent Labs Lab 05/15/13 0415 05/16/13 0602  05/16/13 1729 05/16/13 1927 05/17/13 0330  NA 139 137  < > 140 138 143  K 3.8 4.1  < > 4.6 4.8 4.2  CL 93* 96  --   --   --  103  CO2 31 26  --   --   --  25  BUN 36* 31*  --   --   --  21  CREATININE 1.46* 1.52*  --   --   --  1.07  CALCIUM 10.4 10.3  --   --   --  8.0*  MG  --   --   --   --   --  3.1*  GLUCOSE 94 119*  < > 180* 173* 114*  < > =  values in this interval not displayed.  CBG (last 3)   Recent Labs  05/17/13 0438 05/17/13 0542 05/17/13 0649  GLUCAP 116* 102* 118*    Scheduled Meds: . acetaminophen  1,000 mg Oral 4 times per day   Or  . acetaminophen (TYLENOL) oral liquid 160 mg/5 mL  1,000 mg Per Tube 4 times per day  . aspirin EC  325 mg Oral Daily   Or  . aspirin  324 mg Per Tube Daily  . bisacodyl  10 mg Oral Daily   Or  . bisacodyl  10 mg Rectal Daily  . cefUROXime (ZINACEF)  IV  1.5 g Intravenous Q12H  . docusate sodium  200 mg Oral Daily  . insulin regular  0-10 Units Intravenous TID WC  . metoprolol tartrate  12.5 mg Oral BID   Or  . metoprolol tartrate  12.5 mg Per Tube BID  . [START ON 05/18/2013] pantoprazole  40 mg Oral Daily  . sodium chloride  3 mL Intravenous Q12H    Continuous  Infusions: . sodium chloride 20 mL/hr at 05/17/13 0700  . sodium chloride 20 mL/hr (05/17/13 0726)  . dexmedetomidine Stopped (05/17/13 0400)  . DOPamine 3 mcg/kg/min (05/17/13 0800)  . furosemide (LASIX) infusion 8 mg/hr (05/17/13 0900)  . insulin (NOVOLIN-R) infusion 3.6 Units/hr (05/17/13 1004)  . lactated ringers 20 mL/hr (05/16/13 1915)  . milrinone 0.3 mcg/kg/min (05/17/13 1028)  . nitroGLYCERIN Stopped (05/16/13 2100)  . phenylephrine (NEO-SYNEPHRINE) Adult infusion 30 mcg/min (05/17/13 1000)    Past Medical History  Diagnosis Date  . Chronic airway obstruction, not elsewhere classified   . Precordial pain   . Swelling of limb   . Dizziness and giddiness   . Unspecified transient cerebral ischemia     Diag. 2003  . Acute, but ill-defined, cerebrovascular disease     [address] [place]  . Shortness of breath   . Obesity   . Juvenile rheumatic fever     age 85  . Unspecified essential hypertension   . Hyperlipidemia   . Mitral regurgitation 02/16/2013  . Chronic kidney disease   . CHF (congestive heart failure) 04/23/2013  . Paroxysmal atrial fibrillation 02/16/2013    Recurrent paroxysmal atrial fibrillation, first diagnosed December 2014  . Chronic diastolic congestive heart failure   . Arthritis     Chronic left knee pain  . Aortic insufficiency due to bicuspid aortic valve     moderate by TEE  . 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 S/P Maze operation for atrial fibrillation 05/16/2013    Complete bilateral atrial lesion set using cryothermy and bipolar radiofrequency ablation with oversewing of LA appendage    Past Surgical History  Procedure Laterality Date  . Total knee arthroplasty Right   . Abdominal hysterectomy      Cervical Cancer  . Parathyroid/thyroid surgery      tumor  . Tee without cardioversion  N/A 04/06/2013    Procedure: TRANSESOPHAGEAL ECHOCARDIOGRAM (TEE);  Surgeon: [name], MD;  Location: [facility];  Service: Cardiology;  Laterality: N/A;    Arthur Holms, RD, LDN Pager #: [phone] After-Hours Pager #: [phone]

## 2013-05-17 NOTE — Procedures (Signed)
Extubation Procedure Note  Patient Details:   Name: [name] [name] DOB: [birth year] MRN: [id number]   Airway Documentation:  Airway 8 mm (Active)  Secured at (cm) 22 cm 05/17/2013 11:56 AM  Measured From Lips 05/17/2013 11:56 AM  Secured Location Right 05/17/2013 11:56 AM  Secured By Rana Snare Tape 05/17/2013 11:56 AM  Cuff Pressure (cm H2O) 26 cm H2O 05/16/2013  7:20 PM  Site Condition Edema 05/17/2013 11:56 AM    Evaluation  O2 sats: stable throughout  Complications: No apparent complications Patient did  tolerate procedure well. Bilateral Breath Sounds: Rhonchi;Expiratory wheezes Suctioning: Airway Yes Extubate PT to a 50% venti mask. Vt(359) NIF(-20) PT was able to cough and tell her nasme   [name], [name] 05/17/2013, 3:20 PM

## 2013-05-17 NOTE — Progress Notes (Signed)
Patient has been transferred to 2S from [facility]. Report given to Pamella Pert, Henry d/c to [facility] when medically stable.  I will sign off.  [name]. [place], [place]

## 2013-05-17 NOTE — Progress Notes (Addendum)
TCTS DAILY ICU PROGRESS NOTE                   [address]            [place] [address]          [phone]   1 Day Post-Op Procedure(s) (LRB): MINIMALLY INVASIVE MAZE PROCEDURE (N/A) INTRAOPERATIVE TRANSESOPHAGEAL ECHOCARDIOGRAM (N/A) MINIMALLY INVASIVE MITRAL VALVE (MV) REPLACEMENT (Right)  Total Length of Stay:  LOS: 19 days   Subjective: Remains intubated  Objective: Vital signs in last 24 hours: Temp:  [95.5 F (35.3 C)-97.7 F (36.5 C)] 97.7 F (36.5 C) (03/12 0720) Pulse Rate:  [67-82] 79 (03/12 0720) Cardiac Rhythm:  [-] Atrial paced (03/12 0700) Resp:  [0-26] 17 (03/12 0720) BP: (87-143)/(40-62) 135/49 mmHg (03/12 0700) SpO2:  [94 %-100 %] 95 % (03/12 0720) Arterial Line BP: (98-185)/(39-70) 140/54 mmHg (03/12 0700) FiO2 (%):  [50 %-100 %] 50 % (03/12 0720) Weight:  [219 lb 9.3 oz (99.6 kg)] 219 lb 9.3 oz (99.6 kg) (03/12 0500)  Filed Weights   05/15/13 0309 05/16/13 0429 05/17/13 0500  Weight: 185 lb 4.8 oz (84.052 kg) 184 lb 4.8 oz (83.598 kg) 219 lb 9.3 oz (99.6 kg)    Weight change: 35 lb 4.5 oz (16.002 kg)   Hemodynamic parameters for last 24 hours: PAP: (36-64)/(17-30) 64/29 mmHg CO:  [3.7 L/min-5.7 L/min] 5.7 L/min CI:  [2 L/min/m2-3.1 L/min/m2] 3.1 L/min/m2   Vent Mode:  [-] SIMV;PSV FiO2 (%):  [50 %-100 %] 50 % Set Rate:  [12 bmp] 12 bmp Vt Set:  [440 mL] 440 mL PEEP:  [5 cmH20] 5 cmH20 Pressure Support:  [10 cmH20] 10 cmH20 Plateau Pressure:  [31 cmH20] 31 cmH20    Intake/Output from previous day: 03/11 0701 - 03/12 0700 In: 10350.2 [I.V.:6943.2; Blood:2607; NG/GT:60; IV Piggyback:740] Out: 6280 [Urine:3430; Blood:2500; Chest Tube:350]  Intake/Output this shift: Total I/O In: 66.7 [I.V.:66.7] Out: -   Current Meds: Scheduled Meds: . acetaminophen  1,000 mg Oral 4 times per day   Or  . acetaminophen (TYLENOL) oral liquid 160 mg/5 mL  1,000 mg Per Tube 4 times per day  . aspirin EC  325 mg Oral Daily   Or  .  aspirin  324 mg Per Tube Daily  . bisacodyl  10 mg Oral Daily   Or  . bisacodyl  10 mg Rectal Daily  . cefUROXime (ZINACEF)  IV  1.5 g Intravenous Q12H  . docusate sodium  200 mg Oral Daily  . famotidine (PEPCID) IV  20 mg Intravenous Q12H  . insulin regular  0-10 Units Intravenous TID WC  . metoprolol tartrate  12.5 mg Oral BID   Or  . metoprolol tartrate  12.5 mg Per Tube BID  . [START ON 05/18/2013] pantoprazole  40 mg Oral Daily  . sodium chloride  3 mL Intravenous Q12H   Continuous Infusions: . sodium chloride 20 mL/hr at 05/17/13 0700  . sodium chloride 10 mL/hr (05/17/13 0726)  . sodium chloride Stopped (05/17/13 0740)  . dexmedetomidine Stopped (05/17/13 0400)  . DOPamine 3 mcg/kg/min (05/17/13 0700)  . insulin (NOVOLIN-R) infusion 4.6 Units/hr (05/17/13 0700)  . lactated ringers 20 mL/hr (05/16/13 1915)  . milrinone 0.375 mcg/kg/min (05/17/13 0700)  . nitroGLYCERIN Stopped (05/16/13 2100)  . phenylephrine (NEO-SYNEPHRINE) Adult infusion 40 mcg/min (05/17/13 0700)   PRN Meds:.albumin human, levalbuterol, metoprolol, midazolam, morphine injection, ondansetron (ZOFRAN) IV, oxyCODONE, sodium chloride  General appearance: alert and no distress Neurologic: intact and  follows simple commands Heart: regular rate and rhythm and no murmur Lungs: coarse, dim on right Abdomen: soft Extremities: + peripheral edema Wound: dressings intact  Lab Results: CBC: Recent Labs  05/16/13 1930 05/17/13 0355  WBC 15.2* 15.8*  HGB 10.0* 9.3*  HCT 28.3* 27.0*  PLT 178 182   BMET:  Recent Labs  05/16/13 0602  05/16/13 1927 05/17/13 0330  NA 137  < > 138 143  K 4.1  < > 4.8 4.2  CL 96  --   --  103  CO2 26  --   --  25  GLUCOSE 119*  < > 173* 114*  BUN 31*  --   --  21  CREATININE 1.52*  --   --  1.07  CALCIUM 10.3  --   --  8.0*  < > = values in this interval not displayed.  PT/INR:  Recent Labs  05/16/13 1930  LABPROT 16.0*  INR 1.31   ABG    Component Value  Date/Time   PHART 7.222* 05/17/2013 0651   PCO2ART 62.7* 05/17/2013 0651   PO2ART 82.0 05/17/2013 0651   HCO3 26.0* 05/17/2013 0651   TCO2 28 05/17/2013 0651   ACIDBASEDEF 2.0 05/17/2013 0651   O2SAT 94.0 05/17/2013 0651     Radiology: Dg Chest Portable 1 View  05/16/2013   CLINICAL DATA Postop mitral valve replacement.  EXAM PORTABLE CHEST - 1 VIEW  COMPARISON DG CHEST 2 VIEW dated 05/13/2013; CT ANGIO CHEST AORTA W/CM & WO/CM dated 05/07/2013; DG CHEST 1V PORT dated 05/05/2013; DG CHEST 1V PORT dated 05/03/2013  FINDINGS Endotracheal tube tip in satisfactory position approximately 3-4 cm above the carina. Swan-Ganz catheter tip projects over the left main pulmonary artery. Left jugular central venous catheter tip projects over the junction of the innominate vein and SVC. Right chest tube in place with no pneumothorax. Cardiac silhouette enlarged. Pulmonary venous hypertension with mild interstitial pulmonary edema. Dense consolidation in the right lower lobe and right middle lobe.  IMPRESSION 1. Support apparatus satisfactory. 2. No pneumothorax. 3. Dense atelectasis involving the right middle lobe and right lower lobe. Mild interstitial pulmonary edema.  SIGNATURE  Electronically Signed   By: Evangeline Dakin M.D.   On: 05/16/2013 20:50     Assessment/Plan: S/P Procedure(s) (LRB): MINIMALLY INVASIVE MAZE PROCEDURE (N/A) INTRAOPERATIVE TRANSESOPHAGEAL ECHOCARDIOGRAM (N/A) MINIMALLY INVASIVE MITRAL VALVE (MV) REPLACEMENT (Right)  1 resp acidosis , on SIMV, ? Change to PRVC , increase rate to 18, increase TV 2 good cardiac index, significant volume overload, creat ok, will prob need a lasix gtt. Cont current gtts, apace 3 keep chest tubes  4 H/H stable 5 sugars adeq control   GOLD,WAYNE E 05/17/2013 7:41 AM   I have seen and examined the patient and agree with the assessment and plan as outlined.  Overall stable POD1 maintaining NSR w/ 1st degree AV block and stable hemodynamics, PA pressures  relatively low.  Arousable and follows commands but still too sleepy/lethargic to proceed with extubation.  ABG reveals good oxygenation but respiratory acidosis, low lung volumes and bilateral airspace opacity R>L on pCXR.  Will perform vent recruitment and increase tidal volume and RR to better expand lungs and resolve respiratory acidosis.  Proceed with acute vent wean later once more alert.  Start lasix drip.  OWEN,CLARENCE H 05/17/2013 8:08 AM

## 2013-05-17 NOTE — Progress Notes (Signed)
VT set at 550 per DR Roxy Manns

## 2013-05-17 NOTE — Progress Notes (Signed)
~  1520: Pt extubated per RT.  Pt placed on 4L nasal cannula.  Pt unable to maintain 02 sats >90%.  Pt placed on 50% venti mask per RT.  Pt maintaining 02 sats at 90-92%.  Pt repeatedly trying to cough up secretions.  Pt educated on the use of I.S. Pt able to pull 250 on I.S. after repeated efforts and coaching.    ~1630: Post extubation gas obtained.  Pt's gas showed pH:7.27, pCO2:55.4, pO2: 60, -2/25.4, 86%.  Dr. Roxy Manns notified of gas.  Per Dr. Roxy Manns orders to reintubate pt.  RT and anesthesia notified.    ~1700: Anesthesia at bedside.  Pt reintubated.  02 sats per monitor 98%.  Pt tolerated intubated well. VSS. Pt placed on same ventilator settings prior to extubation via RT.  OGT placed per protocol.  X-ray pending.  Will continue to monitor pt closely.

## 2013-05-17 NOTE — Progress Notes (Signed)
[name], female   DOB: 09/08/1940, 73 y.o.   MRN: [id number]  SICU Evening Rounds:  Hemodynamically stable.  CI 3.1 on dop 3, Milrinone 0.3. Urine output adequate on lasix drip 8  Extubated today and had to be reintubated for respiratory failure. CXR this am looked terrible. Follow-up cxr after intubation pending.  CT output low.  CBC    Component Value Date/Time   WBC 16.3* 05/17/2013 1700   RBC 3.00* 05/17/2013 1700   HGB 9.4* 05/17/2013 1700   HCT 27.2* 05/17/2013 1700   PLT 156 05/17/2013 1700   MCV 90.7 05/17/2013 1700   MCH 31.3 05/17/2013 1700   MCHC 34.6 05/17/2013 1700   RDW 15.7* 05/17/2013 1700   LYMPHSABS 1.3 05/07/2013 0855   MONOABS 0.9 05/07/2013 0855   EOSABS 0.2 05/07/2013 0855   BASOSABS 0.1 05/07/2013 0855    BMET    Component Value Date/Time   NA 143 05/17/2013 0330   K 4.2 05/17/2013 0330   CL 103 05/17/2013 0330   CO2 25 05/17/2013 0330   GLUCOSE 114* 05/17/2013 0330   BUN 21 05/17/2013 0330   CREATININE 1.07 05/17/2013 0330   CALCIUM 8.0* 05/17/2013 0330   GFRNONAA 51* 05/17/2013 0330   GFRAA 59* 05/17/2013 0330   A/P: Postop respiratory failure. Continue vent support tonight and continue diuresis. Hemodynamics look good.

## 2013-05-17 NOTE — Plan of Care (Signed)
Problem: Phase II - Intermediate Post-Op Goal: Maintain Hemodynamic Stability Outcome: Progressing Weaning Neo    Goal: CBGs/Blood Glucose per SCIP Criteria Outcome: Progressing Adequate control still on insulin gtt

## 2013-05-17 NOTE — Addendum Note (Signed)
Addendum created 05/17/13 1608 by Roberts Gaudy, MD   Modules edited: Clinical Notes   Clinical Notes:  File: [id number]; Pend: [id number]; Pend: [id number]; Pend: [id number]; Pend: [id number]; Pend: [id number]; [place]: [id number]

## 2013-05-17 NOTE — Anesthesia Preprocedure Evaluation (Signed)
Anesthesia Evaluation  Patient identified by MRN, date of birth, ID band Patient awake    Reviewed: Allergy & Precautions, H&P , NPO status , Patient's Chart, lab work & pertinent test results  Airway Mallampati: II TM Distance: >3 FB Neck ROM: Full    Dental  (+) Edentulous Upper, Edentulous Lower, Dental Advisory Given   Pulmonary          Cardiovascular hypertension,     Neuro/Psych    GI/Hepatic   Endo/Other    Renal/GU      Musculoskeletal   Abdominal   Peds  Hematology   Anesthesia Other Findings   Reproductive/Obstetrics                           Anesthesia Physical Anesthesia Plan  ASA: IV  Anesthesia Plan: General   Post-op Pain Management:    Induction: Intravenous  Airway Management Planned: Oral ETT  Additional Equipment:   Intra-op Plan:   Post-operative Plan: Post-operative intubation/ventilation  Informed Consent:   Dental advisory given  Plan Discussed with: CRNA and Anesthesiologist  Anesthesia Plan Comments:         Anesthesia Quick Evaluation

## 2013-05-17 NOTE — Transfer of Care (Signed)
Immediate Anesthesia Transfer of Care Note  Patient: [name] [name]  Procedure(s) Performed: * No procedures listed *  Patient Location: ICU  Anesthesia Type:General  Level of Consciousness: unresponsive and Patient remains intubated per anesthesia plan  Airway & Oxygen Therapy: Patient remains intubated per anesthesia plan and Patient placed on Ventilator (see vital sign flow sheet for setting)  Post-op Assessment: Report given to PACU RN and Post -op Vital signs reviewed and stable  Post vital signs: Reviewed and stable  Complications: No apparent anesthesia complications

## 2013-05-17 NOTE — Progress Notes (Signed)
Pt currently unable to wean due to pt still too sedated.  Precedex turned off at 0330.  Pt meets all other criteria at this point.  CI 3.1, BP 139/50 (72), T- 36.5, Atrial Paced at 80.  Pt is arousable with minimal stimulation but drowsy and doses within 2-3 minutes.  Unable to give a moderate strength grip or "thumbs up", nor lift head off of pillow.  Pt should be able to be extubated later today, once more awake.  Francia Greaves Anjelika Ausburn,RN,BSN,CCRN

## 2013-05-17 NOTE — Progress Notes (Signed)
Anesthesiology Follow-up:  Patient extubated 30 minutes ago now on 60% venturi mask. Extubation delayed due to volume overload and respiratory acidosis. Following commands, moving all extremities, hemodynamically stable.  VS: T- 37.1 BP 149/56 HR 90 (AV paced) RR 23 O2 Sat 93% PA 62/26 CO/CI 6.1/3.3  CXR: R. Lower  Lung field opacification c/w edema and atelectasis,smaller region of L. Lower lung consolidation.  K-4.2 glucose 123 BUN/Cr 21/1.07 H/H 9.3/27 Plts 44,30   73 year old female one day S/P MVR via minimally invasive approach. Pt has had recurrent episodes of CHF prior to surgery, has underlying COPD and is overloaded. Plan to continue diuresis, pulmonary toilet,  BIPAP as needed  Roberts Gaudy, MD

## 2013-05-18 ENCOUNTER — Inpatient Hospital Stay (HOSPITAL_COMMUNITY): Payer: Medicare HMO

## 2013-05-18 DIAGNOSIS — J810 Acute pulmonary edema: Secondary | ICD-10-CM

## 2013-05-18 DIAGNOSIS — Z9889 Other specified postprocedural states: Secondary | ICD-10-CM | POA: Diagnosis not present

## 2013-05-18 DIAGNOSIS — I509 Heart failure, unspecified: Secondary | ICD-10-CM | POA: Diagnosis not present

## 2013-05-18 DIAGNOSIS — I4891 Unspecified atrial fibrillation: Secondary | ICD-10-CM | POA: Diagnosis not present

## 2013-05-18 DIAGNOSIS — J9600 Acute respiratory failure, unspecified whether with hypoxia or hypercapnia: Secondary | ICD-10-CM

## 2013-05-18 DIAGNOSIS — I059 Rheumatic mitral valve disease, unspecified: Secondary | ICD-10-CM | POA: Diagnosis not present

## 2013-05-18 DIAGNOSIS — R0602 Shortness of breath: Secondary | ICD-10-CM

## 2013-05-18 LAB — URINALYSIS, ROUTINE W REFLEX MICROSCOPIC
BILIRUBIN URINE: NEGATIVE
GLUCOSE, UA: NEGATIVE mg/dL
Hgb urine dipstick: NEGATIVE
KETONES UR: NEGATIVE mg/dL
Leukocytes, UA: NEGATIVE
Nitrite: NEGATIVE
Protein, ur: NEGATIVE mg/dL
Specific Gravity, Urine: 1.019 (ref 1.005–1.030)
Urobilinogen, UA: 0.2 mg/dL (ref 0.0–1.0)
pH: 5 (ref 5.0–8.0)

## 2013-05-18 LAB — POCT I-STAT 3, ART BLOOD GAS (G3+)
Acid-Base Excess: 5 mmol/L — ABNORMAL HIGH (ref 0.0–2.0)
Acid-Base Excess: 2 mmol/L (ref 0.0–2.0)
BICARBONATE: 26.6 meq/L — AB (ref 20.0–24.0)
BICARBONATE: 25.8 meq/L — AB (ref 20.0–24.0)
O2 Saturation: 94 %
O2 Saturation: 97 %
PH ART: 7.594 — AB (ref 7.350–7.450)
PH ART: 7.453 — AB (ref 7.350–7.450)
PO2 ART: 57 mmHg — AB (ref 80.0–100.0)
Patient temperature: 36.4
TCO2: 27 mmol/L (ref 0–100)
TCO2: 27 mmol/L (ref 0–100)
pCO2 arterial: 27.3 mmHg — ABNORMAL LOW (ref 35.0–45.0)
pCO2 arterial: 36.7 mmHg (ref 35.0–45.0)
pO2, Arterial: 84 mmHg (ref 80.0–100.0)

## 2013-05-18 LAB — BASIC METABOLIC PANEL
BUN: 23 mg/dL (ref 6–23)
BUN: 26 mg/dL — AB (ref 6–23)
CALCIUM: 9.1 mg/dL (ref 8.4–10.5)
CHLORIDE: 104 meq/L (ref 96–112)
CO2: 24 mEq/L (ref 19–32)
CO2: 24 mEq/L (ref 19–32)
Calcium: 8.7 mg/dL (ref 8.4–10.5)
Chloride: 101 mEq/L (ref 96–112)
Creatinine, Ser: 1.31 mg/dL — ABNORMAL HIGH (ref 0.50–1.10)
Creatinine, Ser: 1.33 mg/dL — ABNORMAL HIGH (ref 0.50–1.10)
GFR calc non Af Amer: 39 mL/min — ABNORMAL LOW (ref >90)
GFR, EST AFRICAN AMERICAN: 46 mL/min — AB (ref >90)
GFR, EST AFRICAN AMERICAN: 45 mL/min — AB (ref >90)
GFR, EST NON AFRICAN AMERICAN: 40 mL/min — AB (ref >90)
Glucose, Bld: 107 mg/dL — ABNORMAL HIGH (ref 70–99)
Glucose, Bld: 175 mg/dL — ABNORMAL HIGH (ref 70–99)
POTASSIUM: 3.5 meq/L — AB (ref 3.7–5.3)
Potassium: 3.2 mEq/L — ABNORMAL LOW (ref 3.7–5.3)
SODIUM: 141 meq/L (ref 137–147)
Sodium: 141 mEq/L (ref 137–147)

## 2013-05-18 LAB — GLUCOSE, CAPILLARY
GLUCOSE-CAPILLARY: 100 mg/dL — AB (ref 70–99)
GLUCOSE-CAPILLARY: 111 mg/dL — AB (ref 70–99)
GLUCOSE-CAPILLARY: 108 mg/dL — AB (ref 70–99)
GLUCOSE-CAPILLARY: 99 mg/dL (ref 70–99)
GLUCOSE-CAPILLARY: 102 mg/dL — AB (ref 70–99)
GLUCOSE-CAPILLARY: 119 mg/dL — AB (ref 70–99)
GLUCOSE-CAPILLARY: 163 mg/dL — AB (ref 70–99)
Glucose-Capillary: 100 mg/dL — ABNORMAL HIGH (ref 70–99)
Glucose-Capillary: 100 mg/dL — ABNORMAL HIGH (ref 70–99)
Glucose-Capillary: 102 mg/dL — ABNORMAL HIGH (ref 70–99)
Glucose-Capillary: 103 mg/dL — ABNORMAL HIGH (ref 70–99)
Glucose-Capillary: 108 mg/dL — ABNORMAL HIGH (ref 70–99)
Glucose-Capillary: 111 mg/dL — ABNORMAL HIGH (ref 70–99)
Glucose-Capillary: 112 mg/dL — ABNORMAL HIGH (ref 70–99)
Glucose-Capillary: 114 mg/dL — ABNORMAL HIGH (ref 70–99)
Glucose-Capillary: 117 mg/dL — ABNORMAL HIGH (ref 70–99)
Glucose-Capillary: 136 mg/dL — ABNORMAL HIGH (ref 70–99)
Glucose-Capillary: 143 mg/dL — ABNORMAL HIGH (ref 70–99)

## 2013-05-18 LAB — CBC
HCT: 23.7 % — ABNORMAL LOW (ref 36.0–46.0)
Hemoglobin: 8.3 g/dL — ABNORMAL LOW (ref 12.0–15.0)
MCH: 31.1 pg (ref 26.0–34.0)
MCHC: 35 g/dL (ref 30.0–36.0)
MCV: 88.8 fL (ref 78.0–100.0)
PLATELETS: 122 10*3/uL — AB (ref 150–400)
RBC: 2.67 MIL/uL — ABNORMAL LOW (ref 3.87–5.11)
RDW: 15.1 % (ref 11.5–15.5)
WBC: 10.3 10*3/uL (ref 4.0–10.5)

## 2013-05-18 MED ORDER — INSULIN ASPART 100 UNIT/ML ~~LOC~~ SOLN
0.0000 [IU] | SUBCUTANEOUS | Status: DC
  Administered 2013-05-18 (×2): 2 [IU] via SUBCUTANEOUS
  Administered 2013-05-18: 4 [IU] via SUBCUTANEOUS
  Administered 2013-05-19 – 2013-05-20 (×3): 2 [IU] via SUBCUTANEOUS

## 2013-05-18 MED ORDER — POTASSIUM CHLORIDE 10 MEQ/50ML IV SOLN
10.0000 meq | INTRAVENOUS | Status: AC
  Administered 2013-05-18 (×3): 10 meq via INTRAVENOUS

## 2013-05-18 MED ORDER — POTASSIUM CHLORIDE 10 MEQ/50ML IV SOLN
10.0000 meq | INTRAVENOUS | Status: AC | PRN
  Administered 2013-05-18 (×3): 10 meq via INTRAVENOUS
  Filled 2013-05-18: qty 50

## 2013-05-18 MED ORDER — SODIUM CHLORIDE 0.9 % IV SOLN
1250.0000 mg | INTRAVENOUS | Status: DC
  Administered 2013-05-18 – 2013-05-20 (×3): 1250 mg via INTRAVENOUS
  Filled 2013-05-18 (×4): qty 1250

## 2013-05-18 MED ORDER — BIOTENE DRY MOUTH MT LIQD
15.0000 mL | Freq: Four times a day (QID) | OROMUCOSAL | Status: DC
  Administered 2013-05-18 – 2013-05-19 (×5): 15 mL via OROMUCOSAL

## 2013-05-18 MED ORDER — METOLAZONE 5 MG PO TABS
5.0000 mg | ORAL_TABLET | Freq: Every day | ORAL | Status: DC
  Administered 2013-05-18 – 2013-05-20 (×3): 5 mg via ORAL
  Filled 2013-05-18 (×4): qty 1

## 2013-05-18 MED ORDER — DEXTROSE 5 % IV SOLN
1.0000 g | INTRAVENOUS | Status: DC
  Administered 2013-05-18 – 2013-05-19 (×2): 1 g via INTRAVENOUS
  Filled 2013-05-18 (×3): qty 1

## 2013-05-18 MED ORDER — MILRINONE IN DEXTROSE 20 MG/100ML IV SOLN
0.2000 ug/kg/min | INTRAVENOUS | Status: DC
  Administered 2013-05-18 – 2013-05-21 (×4): 0.2 ug/kg/min via INTRAVENOUS
  Filled 2013-05-18 (×4): qty 100

## 2013-05-18 MED ORDER — POTASSIUM CHLORIDE 10 MEQ/50ML IV SOLN
10.0000 meq | INTRAVENOUS | Status: DC | PRN
  Administered 2013-05-19 – 2013-05-20 (×3): 10 meq via INTRAVENOUS
  Filled 2013-05-18: qty 50

## 2013-05-18 MED ORDER — VITAL HIGH PROTEIN PO LIQD
1000.0000 mL | ORAL | Status: DC
  Filled 2013-05-18 (×2): qty 1000

## 2013-05-18 MED ORDER — MORPHINE SULFATE 2 MG/ML IJ SOLN
2.0000 mg | INTRAMUSCULAR | Status: DC | PRN
  Filled 2013-05-18: qty 1

## 2013-05-18 MED ORDER — ENOXAPARIN SODIUM 30 MG/0.3ML ~~LOC~~ SOLN
30.0000 mg | SUBCUTANEOUS | Status: DC
Start: 2013-05-19 — End: 2013-05-25
  Administered 2013-05-19 – 2013-05-24 (×6): 30 mg via SUBCUTANEOUS
  Filled 2013-05-18 (×8): qty 0.3

## 2013-05-18 MED ORDER — CHLORHEXIDINE GLUCONATE 0.12 % MT SOLN
15.0000 mL | Freq: Two times a day (BID) | OROMUCOSAL | Status: DC
  Administered 2013-05-17 – 2013-05-19 (×3): 15 mL via OROMUCOSAL
  Filled 2013-05-18 (×3): qty 15

## 2013-05-18 MED ORDER — LEVALBUTEROL HCL 0.63 MG/3ML IN NEBU
0.6300 mg | INHALATION_SOLUTION | Freq: Four times a day (QID) | RESPIRATORY_TRACT | Status: DC
  Administered 2013-05-18 – 2013-05-21 (×11): 0.63 mg via RESPIRATORY_TRACT
  Filled 2013-05-18 (×20): qty 3

## 2013-05-18 MED ORDER — POTASSIUM CHLORIDE 10 MEQ/50ML IV SOLN
10.0000 meq | INTRAVENOUS | Status: DC | PRN
  Administered 2013-05-20: 10 meq via INTRAVENOUS
  Filled 2013-05-18 (×2): qty 50

## 2013-05-18 MED ORDER — PANTOPRAZOLE SODIUM 40 MG IV SOLR
40.0000 mg | INTRAVENOUS | Status: DC
  Administered 2013-05-18 – 2013-05-20 (×3): 40 mg via INTRAVENOUS
  Filled 2013-05-18 (×6): qty 40

## 2013-05-18 MED ORDER — POTASSIUM CHLORIDE 10 MEQ/50ML IV SOLN
10.0000 meq | INTRAVENOUS | Status: AC
  Administered 2013-05-18 (×2): 10 meq via INTRAVENOUS
  Filled 2013-05-18: qty 50

## 2013-05-18 MED ORDER — ENOXAPARIN SODIUM 30 MG/0.3ML ~~LOC~~ SOLN
30.0000 mg | SUBCUTANEOUS | Status: DC
  Filled 2013-05-18: qty 0.3

## 2013-05-18 MED ORDER — DOPAMINE-DEXTROSE 3.2-5 MG/ML-% IV SOLN
2.0000 ug/kg/min | INTRAVENOUS | Status: DC
  Administered 2013-05-18: 3 ug/kg/min via INTRAVENOUS
  Filled 2013-05-18: qty 250

## 2013-05-18 MED ORDER — AMIODARONE HCL IN DEXTROSE 360-4.14 MG/200ML-% IV SOLN
30.0000 mg/h | INTRAVENOUS | Status: DC
  Administered 2013-05-18 – 2013-05-20 (×5): 30 mg/h via INTRAVENOUS
  Filled 2013-05-18 (×10): qty 200

## 2013-05-18 NOTE — Progress Notes (Signed)
[name], female   DOB: [birth year], 73 y.o.   MRN: [id number]   SUBJECTIVE: Now s/p minimally invasive MV repair and Maze.  She remains in NSR.  Attempted extubation yesterday but failed.  Remains on milrinone and dopamine gtts as well as Lasix gtt.  I/Os near even yesterday.  CXR wet.  Patient wakes appropriately per nursing.   CI 2.5 PA 37/19  Scheduled Meds: . acetaminophen  1,000 mg Oral 4 times per day   Or  . acetaminophen (TYLENOL) oral liquid 160 mg/5 mL  1,000 mg Per Tube 4 times per day  . aspirin EC  325 mg Oral Daily   Or  . aspirin  324 mg Per Tube Daily  . bisacodyl  10 mg Oral Daily   Or  . bisacodyl  10 mg Rectal Daily  . docusate sodium  200 mg Oral Daily  . insulin regular  0-10 Units Intravenous TID WC  . metoprolol tartrate  12.5 mg Oral BID   Or  . metoprolol tartrate  12.5 mg Per Tube BID  . pantoprazole  40 mg Oral Daily  . sodium chloride  3 mL Intravenous Q12H   Continuous Infusions: . sodium chloride 20 mL/hr at 05/18/13 0600  . sodium chloride 20 mL/hr at 05/18/13 0600  . dexmedetomidine 0.3 mcg/kg/hr (05/18/13 0800)  . DOPamine 2.5 mcg/kg/min (05/18/13 0800)  . furosemide (LASIX) infusion 8 mg/hr (05/18/13 0800)  . insulin (NOVOLIN-R) infusion 1.7 Units/hr (05/18/13 0800)  . lactated ringers Stopped (05/17/13 1500)  . milrinone 0.3 mcg/kg/min (05/18/13 0800)  . nitroGLYCERIN Stopped (05/16/13 2100)  . phenylephrine (NEO-SYNEPHRINE) Adult infusion Stopped (05/18/13 0100)   PRN Meds:.levalbuterol, metoprolol, midazolam, morphine injection, ondansetron (ZOFRAN) IV, oxyCODONE, potassium chloride, sodium chloride    Filed Vitals:   05/18/13 0600 05/18/13 0700 05/18/13 0800 05/18/13 0809  BP:      Pulse: 77 80 78 78  Temp: 97.7 F (36.5 C) 97.5 F (36.4 C) 97.3 F (36.3 C) 97.3 F (36.3 C)  TempSrc:      Resp: 13 13 15 11   Height:      Weight:      SpO2: 99% 98% 99% 98%    Intake/Output Summary (Last 24 hours) at  05/18/13 9983 Last data filed at 05/18/13 0800  Gross per 24 hour  Intake 2517.46 ml  Output   3120 ml  Net -602.54 ml    LABS: Basic Metabolic Panel:  Recent Labs  05/17/13 0330 05/17/13 1700 05/18/13 0355  NA 143  --  141  K 4.2  --  3.2*  CL 103  --  101  CO2 25  --  24  GLUCOSE 114*  --  107*  BUN 21  --  23  CREATININE 1.07 1.27* 1.31*  CALCIUM 8.0*  --  9.1  MG 3.1* 2.8*  --    Liver Function Tests: No results found for this basename: AST, ALT, ALKPHOS, BILITOT, PROT, ALBUMIN,  in the last 72 hours No results found for this basename: LIPASE, AMYLASE,  in the last 72 hours CBC:  Recent Labs  05/17/13 1700 05/18/13 0355  WBC 16.3* 10.3  HGB 9.4* 8.3*  HCT 27.2* 23.7*  MCV 90.7 88.8  PLT 156 122*   Cardiac Enzymes: No results found for this basename: CKTOTAL, CKMB, CKMBINDEX, TROPONINI,  in the last 72 hours BNP: No components found with this basename: POCBNP,  D-Dimer: No results found for this basename: DDIMER,  in the last 72 hours Hemoglobin  A1C:  Recent Labs  05/15/13 1837  HGBA1C 5.9*   Fasting Lipid Panel: No results found for this basename: CHOL, HDL, LDLCALC, TRIG, CHOLHDL, LDLDIRECT,  in the last 72 hours Thyroid Function Tests: No results found for this basename: TSH, T4TOTAL, FREET3, T3FREE, THYROIDAB,  in the last 72 hours Anemia Panel: No results found for this basename: VITAMINB12, FOLATE, FERRITIN, TIBC, IRON, RETICCTPCT,  in the last 72 hours   PHYSICAL EXAM General: NAD, intubated Neck: JVP elevated, no thyromegaly or thyroid nodule.  Lungs: Dependent crackles CV: Nondisplaced PMI.  Heart regular S1/S2, no S3/S4, no murmur.  1+ ankle edema.   Abdomen: Soft, nontender, no hepatosplenomegaly, no distention.  Neurologic: Intubated/sedated Extremities: No clubbing or cyanosis.   TELEMETRY: Reviewed telemetry pt in NSR, HR 60s  ASSESSMENT AND PLAN: 73 yo was transferred to [facility] for evaluation of mitral regurgitation for  potential repair.  She had acute on chronic diastolic CHF and paroxysmal atrial fibrillation.  She underwent minimally invasive MV repair and Maze.  She remains intubated.  1. Mitral regurgitation: Severe MR, s/p minimally invasive MV repair and Maze.   2. Acute on chronic diastolic CHF: Patient was re-intubated yesterday, likely due to pulmonary edema looking at CXR.  Cardiac output is adequate.  Would work on diuresis today, increase Lasix gtt to 12 mg/hr.  Can add metolazone if needed.  3. Atrial fibrillation: She remains in NSR after Maze.  Would restart Eliquis eventually.  4. Renal dysfunction:  Stable creatinine.     [name] 05/18/2013 8:22 AM

## 2013-05-18 NOTE — Progress Notes (Signed)
CT surgery p.m. Rounds  Excellent diuresis today, we'll supplement IV potassium this evening Maintaining sinus rhythm with PACs Alert on ventilator Lungs clear

## 2013-05-18 NOTE — Progress Notes (Signed)
[name], female   DOB: Jan 06, 1941, 73 y.o.   MRN: [id number] TCTS DAILY ICU PROGRESS NOTE                   [address]            [place] [address]          [phone]   2 Days Post-Op Procedure(s) (LRB): MINIMALLY INVASIVE MAZE PROCEDURE (N/A) INTRAOPERATIVE TRANSESOPHAGEAL ECHOCARDIOGRAM (N/A) MINIMALLY INVASIVE MITRAL VALVE (MV) REPLACEMENT (Right)  Total Length of Stay:  LOS: 20 days   Subjective: Back on vent last pm, now awake and follows commands  Objective: Vital signs in last 24 hours: Temp:  [97.3 F (36.3 C)-99.3 F (37.4 C)] 97.3 F (36.3 C) (03/13 0809) Pulse Rate:  [76-98] 78 (03/13 0809) Cardiac Rhythm:  [-] Normal sinus rhythm (03/13 0800) Resp:  [0-27] 11 (03/13 0809) BP: (98-130)/(44-59) 98/50 mmHg (03/13 0400) SpO2:  [85 %-100 %] 98 % (03/13 0809) Arterial Line BP: (86-182)/(37-67) 100/45 mmHg (03/13 0800) FiO2 (%):  [40 %-55 %] 50 % (03/13 0809) Weight:  [218 lb 0.6 oz (98.9 kg)] 218 lb 0.6 oz (98.9 kg) (03/13 0500)  Filed Weights   05/16/13 0429 05/17/13 0500 05/18/13 0500  Weight: 184 lb 4.8 oz (83.598 kg) 219 lb 9.3 oz (99.6 kg) 218 lb 0.6 oz (98.9 kg)    Weight change: -1 lb 8.7 oz (-0.7 kg)   Hemodynamic parameters for last 24 hours: PAP: (28-71)/(16-28) 37/19 mmHg CO:  [4.2 L/min-6.2 L/min] 4.5 L/min CI:  [2.3 L/min/m2-3.4 L/min/m2] 2.5 L/min/m2  Intake/Output from previous day: 03/12 0701 - 03/13 0700 In: 2575.6 [I.V.:2265.6; NG/GT:60; IV Piggyback:250] Out: 3500 [Urine:2650; Chest Tube:380]  Intake/Output this shift: Total I/O In: 117.2 [I.V.:67.2; IV Piggyback:50] Out: 145 [Urine:125; Chest Tube:20]  Current Meds: Scheduled Meds: . acetaminophen  1,000 mg Oral 4 times per day   Or  . acetaminophen (TYLENOL) oral liquid 160 mg/5 mL  1,000 mg Per Tube 4 times per day  . aspirin EC  325 mg Oral Daily   Or  . aspirin  324 mg Per Tube Daily  . bisacodyl  10 mg Oral Daily   Or  .  bisacodyl  10 mg Rectal Daily  . docusate sodium  200 mg Oral Daily  . insulin regular  0-10 Units Intravenous TID WC  . metoprolol tartrate  12.5 mg Oral BID   Or  . metoprolol tartrate  12.5 mg Per Tube BID  . pantoprazole  40 mg Oral Daily  . sodium chloride  3 mL Intravenous Q12H   Continuous Infusions: . sodium chloride 20 mL/hr at 05/18/13 0600  . sodium chloride 20 mL/hr at 05/18/13 0600  . dexmedetomidine 0.3 mcg/kg/hr (05/18/13 0800)  . DOPamine 2.5 mcg/kg/min (05/18/13 0800)  . furosemide (LASIX) infusion 8 mg/hr (05/18/13 0800)  . insulin (NOVOLIN-R) infusion 1.7 Units/hr (05/18/13 0800)  . lactated ringers Stopped (05/17/13 1500)  . milrinone 0.3 mcg/kg/min (05/18/13 0800)  . nitroGLYCERIN Stopped (05/16/13 2100)  . phenylephrine (NEO-SYNEPHRINE) Adult infusion Stopped (05/18/13 0100)   PRN Meds:.levalbuterol, metoprolol, midazolam, morphine injection, ondansetron (ZOFRAN) IV, oxyCODONE, potassium chloride, sodium chloride  General appearance: alert, cooperative and no distress Neurologic: intact Heart: regular rate and rhythm Lungs: diminished breath sounds bilaterally Abdomen: soft, non-tender; bowel sounds normal; no masses,  no organomegaly Extremities: extremities normal, atraumatic, no cyanosis or edema and Homans sign is negative, no sign of DVT  Lab Results: CBC: Recent Labs  05/17/13  1700 05/18/13 0355  WBC 16.3* 10.3  HGB 9.4* 8.3*  HCT 27.2* 23.7*  PLT 156 122*   BMET:  Recent Labs  05/17/13 0330 05/17/13 1700 05/18/13 0355  NA 143  --  141  K 4.2  --  3.2*  CL 103  --  101  CO2 25  --  24  GLUCOSE 114*  --  107*  BUN 21  --  23  CREATININE 1.07 1.27* 1.31*  CALCIUM 8.0*  --  9.1    PT/INR:  Recent Labs  05/16/13 1930  LABPROT 16.0*  INR 1.31   Radiology: Dg Chest Port 1 View  05/18/2013   CLINICAL DATA:  Followup atelectasis.  CHF.  EXAM: PORTABLE CHEST - 1 VIEW  COMPARISON:  05/17/2013  FINDINGS: Support devices are unchanged.  Patchy right lung and left basilar airspace opacities are again noted, not significantly changed. Suspect small right effusion. Mild cardiomegaly. No acute bony abnormality.  IMPRESSION: No significant change since prior study.   Electronically Signed   By: Rolm Baptise M.D.   On: 05/18/2013 08:30   Dg Chest Port 1 View  05/17/2013   CLINICAL DATA:  Status post intubation.  EXAM: PORTABLE CHEST - 1 VIEW  COMPARISON:  May 17, 2013.  FINDINGS: Endotracheal tube is in grossly good position with distal tip 2 cm above the carina. Nasogastric tube passes through the esophagus and into the stomach. At least 2 right-sided chest tubes are noted without evidence of pneumothorax. Right base opacity is slightly improved consistent with atelectasis, pneumonia or effusion. Left lung is clear. Left internal jugular Swan-Ganz catheter is noted with tip directed toward right pulmonary artery. Another left internal jugular catheter is noted with distal tip at the SVC.  IMPRESSION: Endotracheal tube in grossly good position. Right basilar opacity is slightly improved compared to prior exam. No pneumothorax is seen.   Electronically Signed   By: Sabino Dick M.D.   On: 05/17/2013 18:59   Dg Chest Portable 1 View In Am  05/17/2013   CLINICAL DATA Mitral valve disorder, postop film  EXAM PORTABLE CHEST - 1 VIEW  COMPARISON DG CHEST 1V PORT dated 05/16/2013; DG CHEST 2 VIEW dated 05/13/2013; CT ANGIO CHEST AORTA W/CM & WO/CM dated 05/07/2013  FINDINGS Support devices in unchanged position. Three right-sided chest tubes now present. Mitral valve replacement noted. Significant cardiac enlargement stable. Extensive pleural-parenchymal opacity right mid to lower lung zone, unchanged. Increased left lower lobe consolidation. Stable mild interstitial pulmonary edema.  IMPRESSION Severe stable pleural parenchymal opacity right mid to lower lung zone. Increase severity of left lower lobe consolidation and possible left effusion.  SIGNATURE   Electronically Signed   By: Skipper Cliche M.D.   On: 05/17/2013 08:03   Dg Chest Portable 1 View  05/16/2013   CLINICAL DATA Postop mitral valve replacement.  EXAM PORTABLE CHEST - 1 VIEW  COMPARISON DG CHEST 2 VIEW dated 05/13/2013; CT ANGIO CHEST AORTA W/CM & WO/CM dated 05/07/2013; DG CHEST 1V PORT dated 05/05/2013; DG CHEST 1V PORT dated 05/03/2013  FINDINGS Endotracheal tube tip in satisfactory position approximately 3-4 cm above the carina. Swan-Ganz catheter tip projects over the left main pulmonary artery. Left jugular central venous catheter tip projects over the junction of the innominate vein and SVC. Right chest tube in place with no pneumothorax. Cardiac silhouette enlarged. Pulmonary venous hypertension with mild interstitial pulmonary edema. Dense consolidation in the right lower lobe and right middle lobe.  IMPRESSION 1. Support apparatus satisfactory. 2.  No pneumothorax. 3. Dense atelectasis involving the right middle lobe and right lower lobe. Mild interstitial pulmonary edema.  SIGNATURE  Electronically Signed   By: Evangeline Dakin M.D.   On: 05/16/2013 20:50     Assessment/Plan: S/P Procedure(s) (LRB): MINIMALLY INVASIVE MAZE PROCEDURE (N/A) INTRAOPERATIVE TRANSESOPHAGEAL ECHOCARDIOGRAM (N/A) MINIMALLY INVASIVE MITRAL VALVE (MV) REPLACEMENT (Right) Diuresis Diabetes control Continue foley due to diuresing patient Increase lasix drip Stop insulin drip  When fluid status better try to extubate, likely tomorrow morning Follow up labs ordered,renal function stable now but history of CKD Leave ct for now Replace K + DVT prophylaxis SCD  and start low dose Lovenox  Expected Acute  Blood - loss Anemia watch Hgb may need transfusion if drops further  Yohann Curl B 05/18/2013 8:36 AM

## 2013-05-18 NOTE — Progress Notes (Signed)
NUTRITION CONSULT/FOLLOW UP  INTERVENTION:  Initiate Vital HP formula at 20 ml/hr and increase by 10 ml every 4 hours to goal rate of 50 ml/hr to provide 1200 kcals (24 kcals/kg ideal body weight), 105 gm protein (100% of estimated protein needs), 1003 ml of free water RD to follow for nutrition care plan  NUTRITION DIAGNOSIS: Inadequate oral intake related to inability to eat as evidenced by NPO status, ongoing  Goal: EN to provide 60-70% of estimated calorie needs (22-25 kcals/kg ideal body weight) and 100% of estimated protein needs, based on ASPEN guidelines for permissive underfeeding in critically ill obese individuals, currently unmet  Monitor:  TF regimen & tolerance, respiratory status, weight, labs, I/O's  ASSESSMENT: Patient admitted with shortness of breath; s/p cardiac cath 2/27; + severe mitral regurgitation; decision made to proceed with surgical intervention.  Patient s/p procedures 3/12: MINIMALLY INVASIVE MAZE PROCEDURE  INTRAOPERATIVE TRANSESOPHAGEAL ECHOCARDIOGRAM  MINIMALLY INVASIVE MITRAL VALVE REPLACEMENT   Patient transferred from 3E-CHF to 2S-Surgical ICU post-op.  Patient is currently intubated on ventilator support -- OGT in place MV: 7.0 L/min Temp (24hrs), Avg:98.2 F (36.8 C), Min:97.3 F (36.3 C), Max:99.1 F (37.3 C)   RD consulted for TF initiation & management.  Height: Ht Readings from Last 1 Encounters:  05/16/13 5\' 2"  (1.575 m)    Weight: Wt Readings from Last 1 Encounters:  05/18/13 218 lb 0.6 oz (98.9 kg)    03/12  219 lb 03/11  184 lb 03/10  185 lb 03/09  186 lb 03/08  187 lb 03/07  185 lb 03/06  188 lb 03/05  187 lb 03/04  188 lb 03/03  188 lb  BMI:  Body mass index is 39.87 kg/(m^2).  Estimated Nutritional Needs: Kcal: 1559 Protein: 100-110 gm Fluid: per MD  Skin: Stage I pressure ulcer to buttocks, surgical incisions   Diet Order: NPO   Intake/Output Summary (Last 24 hours) at 05/18/13 1345 Last data  filed at 05/18/13 1200  Gross per 24 hour  Intake 2180.22 ml  Output   2905 ml  Net -724.78 ml    Labs:   Recent Labs Lab 05/16/13 0602  05/16/13 1927 05/17/13 0330 05/17/13 1700 05/18/13 0355  NA 137  < > 138 143  --  141  K 4.1  < > 4.8 4.2  --  3.2*  CL 96  --   --  103  --  101  CO2 26  --   --  25  --  24  BUN 31*  --   --  21  --  23  CREATININE 1.52*  --   --  1.07 1.27* 1.31*  CALCIUM 10.3  --   --  8.0*  --  9.1  MG  --   --   --  3.1* 2.8*  --   GLUCOSE 119*  < > 173* 114*  --  107*  < > = values in this interval not displayed.  CBG (last 3)   Recent Labs  05/18/13 0800 05/18/13 0909 05/18/13 1132  GLUCAP 117* 102* 119*    Scheduled Meds: . acetaminophen  1,000 mg Oral 4 times per day   Or  . acetaminophen (TYLENOL) oral liquid 160 mg/5 mL  1,000 mg Per Tube 4 times per day  . antiseptic oral rinse  15 mL Mouth Rinse QID  . aspirin EC  325 mg Oral Daily   Or  . aspirin  324 mg Per Tube Daily  . bisacodyl  10  mg Rectal Daily  . ceFEPime (MAXIPIME) IV  1 g Intravenous Q24H  . chlorhexidine  15 mL Mouth Rinse BID  . [START ON 05/19/2013] enoxaparin (LOVENOX) injection  30 mg Subcutaneous Q24H  . insulin aspart  0-24 Units Subcutaneous 6 times per day  . levalbuterol  0.63 mg Nebulization Q6H  . metolazone  5 mg Oral Daily  . pantoprazole (PROTONIX) IV  40 mg Intravenous Q24H  . sodium chloride  3 mL Intravenous Q12H  . vancomycin  1,250 mg Intravenous Q24H    Continuous Infusions: . sodium chloride 20 mL/hr at 05/18/13 0600  . sodium chloride 20 mL/hr at 05/18/13 0600  . amiodarone (NEXTERONE PREMIX) 360 mg/200 mL dextrose 30 mg/hr (05/18/13 1100)  . dexmedetomidine 0.5 mcg/kg/hr (05/18/13 1135)  . DOPamine 3 mcg/kg/min (05/18/13 1100)  . furosemide (LASIX) infusion 12 mg/hr (05/18/13 1100)  . milrinone 0.2 mcg/kg/min (05/18/13 1100)    Past Medical History  Diagnosis Date  . Chronic airway obstruction, not elsewhere classified   .  Precordial pain   . Swelling of limb   . Dizziness and giddiness   . Unspecified transient cerebral ischemia     Diag. 2003  . Acute, but ill-defined, cerebrovascular disease     2000 [place]  . Shortness of breath   . Obesity   . Juvenile rheumatic fever     age 69  . Unspecified essential hypertension   . Hyperlipidemia   . Mitral regurgitation 02/16/2013  . Chronic kidney disease   . CHF (congestive heart failure) 04/23/2013  . Paroxysmal atrial fibrillation 02/16/2013    Recurrent paroxysmal atrial fibrillation, first diagnosed December 2014  . Chronic diastolic congestive heart failure   . Arthritis     Chronic left knee pain  . Aortic insufficiency due to bicuspid aortic valve     moderate by TEE  . 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 S/P Maze operation for atrial fibrillation 05/16/2013    Complete bilateral atrial lesion set using cryothermy and bipolar radiofrequency ablation with oversewing of LA appendage    Past Surgical History  Procedure Laterality Date  . Total knee arthroplasty Right   . Abdominal hysterectomy      Cervical Cancer  . Parathyroid/thyroid surgery      tumor  . Tee without cardioversion N/A 04/06/2013    Procedure: TRANSESOPHAGEAL ECHOCARDIOGRAM (TEE);  Surgeon: [name], MD;  Location[facility];  Service: Cardiology;  Laterality: N/A;  . Minimally invasive maze procedure N/A 05/16/2013    Procedure: MINIMALLY INVASIVE MAZE PROCEDURE;  Surgeon: Rexene Alberts, MD;  Location: [facility];  Service: Open Heart Surgery;  Laterality: N/A;  . Intraoperative transesophageal echocardiogram N/A 05/16/2013    Procedure: INTRAOPERATIVE TRANSESOPHAGEAL ECHOCARDIOGRAM;  Surgeon: Rexene Alberts, MD;  Location: [facility];  Service: Open Heart Surgery;  Laterality: N/A;  . Mitral valve replacement Right  05/16/2013    Procedure: MINIMALLY INVASIVE MITRAL VALVE (MV) REPLACEMENT;  Surgeon: Rexene Alberts, MD;  Location: [facility];  Service: Open Heart Surgery;  Laterality: Right;    Arthur Holms, RD, LDN Pager #: [phone] After-Hours Pager #: [phone]

## 2013-05-18 NOTE — Progress Notes (Signed)
Sputum specimen collected via ETT suction and taken to main lab.

## 2013-05-18 NOTE — Progress Notes (Addendum)
TCTS DAILY ICU PROGRESS NOTE                   [address]            [place] [address]          [phone]   2 Days Post-Op Procedure(s) (LRB): MINIMALLY INVASIVE MAZE PROCEDURE (N/A) INTRAOPERATIVE TRANSESOPHAGEAL ECHOCARDIOGRAM (N/A) MINIMALLY INVASIVE MITRAL VALVE (MV) REPLACEMENT (Right)  Total Length of Stay:  LOS: 20 days   Subjective: Alert and comfortable  Objective: Vital signs in last 24 hours: Temp:  [97.3 F (36.3 C)-99.3 F (37.4 C)] 97.5 F (36.4 C) (03/13 0700) Pulse Rate:  [76-98] 80 (03/13 0700) Cardiac Rhythm:  [-] Normal sinus rhythm (03/13 0700) Resp:  [0-27] 13 (03/13 0700) BP: (98-130)/(43-59) 98/50 mmHg (03/13 0400) SpO2:  [85 %-100 %] 98 % (03/13 0700) Arterial Line BP: (86-182)/(37-67) 109/49 mmHg (03/13 0700) FiO2 (%):  [40 %-55 %] 50 % (03/13 0700) Weight:  [218 lb 0.6 oz (98.9 kg)] 218 lb 0.6 oz (98.9 kg) (03/13 0500)  Filed Weights   05/16/13 0429 05/17/13 0500 05/18/13 0500  Weight: 184 lb 4.8 oz (83.598 kg) 219 lb 9.3 oz (99.6 kg) 218 lb 0.6 oz (98.9 kg)    Weight change: -1 lb 8.7 oz (-0.7 kg)   Hemodynamic parameters for last 24 hours: PAP: (28-71)/(16-28) 34/18 mmHg CO:  [4.2 L/min-6.2 L/min] 4.2 L/min CI:  [2.3 L/min/m2-3.4 L/min/m2] 2.3 L/min/m2  Intake/Output from previous day: 03/12 0701 - 03/13 0700 In: 2529.3 [I.V.:2219.3; NG/GT:60; IV Piggyback:250] Out: 2956 [Urine:2650; Chest Tube:380]  Vent Mode:  [-] PRVC FiO2 (%):  [40 %-55 %] 50 % Set Rate:  [14 bmp-18 bmp] 14 bmp Vt Set:  [500 mL-550 mL] 500 mL PEEP:  [5 cmH20] 5 cmH20 Plateau Pressure:  [23 cmH20-25 cmH20] 25 cmH20   Intake/Output this shift:    Current Meds: Scheduled Meds: . acetaminophen  1,000 mg Oral 4 times per day   Or  . acetaminophen (TYLENOL) oral liquid 160 mg/5 mL  1,000 mg Per Tube 4 times per day  . aspirin EC  325 mg Oral Daily   Or  . aspirin  324 mg Per Tube Daily  . bisacodyl  10 mg Oral Daily   Or  .  bisacodyl  10 mg Rectal Daily  . docusate sodium  200 mg Oral Daily  . insulin regular  0-10 Units Intravenous TID WC  . metoprolol tartrate  12.5 mg Oral BID   Or  . metoprolol tartrate  12.5 mg Per Tube BID  . pantoprazole  40 mg Oral Daily  . sodium chloride  3 mL Intravenous Q12H   Continuous Infusions: . sodium chloride 20 mL/hr at 05/18/13 0600  . sodium chloride 20 mL/hr at 05/18/13 0600  . dexmedetomidine 0.3 mcg/kg/hr (05/18/13 0600)  . DOPamine 2.5 mcg/kg/min (05/18/13 0700)  . furosemide (LASIX) infusion 8 mg/hr (05/18/13 0700)  . insulin (NOVOLIN-R) infusion 1.5 Units/hr (05/18/13 0700)  . lactated ringers Stopped (05/17/13 1500)  . milrinone 0.3 mcg/kg/min (05/18/13 0700)  . nitroGLYCERIN Stopped (05/16/13 2100)  . phenylephrine (NEO-SYNEPHRINE) Adult infusion Stopped (05/18/13 0100)   PRN Meds:.levalbuterol, metoprolol, midazolam, morphine injection, ondansetron (ZOFRAN) IV, oxyCODONE, potassium chloride, sodium chloride  General appearance: alert, appears stated age and no distress Neurologic: intact Heart: regular rate and rhythm Lungs: clear anteriorly Abdomen: soft, non-tender Extremities: + periph edema Wound: dressings intact  Lab Results: CBC: Recent Labs  05/17/13 1700 05/18/13 0355  WBC 16.3*  10.3  HGB 9.4* 8.3*  HCT 27.2* 23.7*  PLT 156 122*   BMET:  Recent Labs  05/17/13 0330 05/17/13 1700 05/18/13 0355  NA 143  --  141  K 4.2  --  3.2*  CL 103  --  101  CO2 25  --  24  GLUCOSE 114*  --  107*  BUN 21  --  23  CREATININE 1.07 1.27* 1.31*  CALCIUM 8.0*  --  9.1    PT/INR:  Recent Labs  05/16/13 1930  LABPROT 16.0*  INR 1.31   ABG    Component Value Date/Time   PHART 7.453* 05/18/2013 0658   PCO2ART 36.7 05/18/2013 0658   PO2ART 84.0 05/18/2013 0658   HCO3 25.8* 05/18/2013 0658   TCO2 27 05/18/2013 0658   ACIDBASEDEF 2.0 05/17/2013 1626   O2SAT 97.0 05/18/2013 0658     Radiology: Dg Chest Port 1 View  05/17/2013   CLINICAL  DATA:  Status post intubation.  EXAM: PORTABLE CHEST - 1 VIEW  COMPARISON:  May 17, 2013.  FINDINGS: Endotracheal tube is in grossly good position with distal tip 2 cm above the carina. Nasogastric tube passes through the esophagus and into the stomach. At least 2 right-sided chest tubes are noted without evidence of pneumothorax. Right base opacity is slightly improved consistent with atelectasis, pneumonia or effusion. Left lung is clear. Left internal jugular Swan-Ganz catheter is noted with tip directed toward right pulmonary artery. Another left internal jugular catheter is noted with distal tip at the SVC.  IMPRESSION: Endotracheal tube in grossly good position. Right basilar opacity is slightly improved compared to prior exam. No pneumothorax is seen.   Electronically Signed   By: Sabino Dick M.D.   On: 05/17/2013 18:59   Dg Chest Portable 1 View In Am  05/17/2013   CLINICAL DATA Mitral valve disorder, postop film  EXAM PORTABLE CHEST - 1 VIEW  COMPARISON DG CHEST 1V PORT dated 05/16/2013; DG CHEST 2 VIEW dated 05/13/2013; CT ANGIO CHEST AORTA W/CM & WO/CM dated 05/07/2013  FINDINGS Support devices in unchanged position. Three right-sided chest tubes now present. Mitral valve replacement noted. Significant cardiac enlargement stable. Extensive pleural-parenchymal opacity right mid to lower lung zone, unchanged. Increased left lower lobe consolidation. Stable mild interstitial pulmonary edema.  IMPRESSION Severe stable pleural parenchymal opacity right mid to lower lung zone. Increase severity of left lower lobe consolidation and possible left effusion.  SIGNATURE  Electronically Signed   By: Skipper Cliche M.D.   On: 05/17/2013 08:03   Dg Chest Portable 1 View  05/16/2013   CLINICAL DATA Postop mitral valve replacement.  EXAM PORTABLE CHEST - 1 VIEW  COMPARISON DG CHEST 2 VIEW dated 05/13/2013; CT ANGIO CHEST AORTA W/CM & WO/CM dated 05/07/2013; DG CHEST 1V PORT dated 05/05/2013; DG CHEST 1V PORT dated  05/03/2013  FINDINGS Endotracheal tube tip in satisfactory position approximately 3-4 cm above the carina. Swan-Ganz catheter tip projects over the left main pulmonary artery. Left jugular central venous catheter tip projects over the junction of the innominate vein and SVC. Right chest tube in place with no pneumothorax. Cardiac silhouette enlarged. Pulmonary venous hypertension with mild interstitial pulmonary edema. Dense consolidation in the right lower lobe and right middle lobe.  IMPRESSION 1. Support apparatus satisfactory. 2. No pneumothorax. 3. Dense atelectasis involving the right middle lobe and right lower lobe. Mild interstitial pulmonary edema.  SIGNATURE  Electronically Signed   By: Evangeline Dakin M.D.   On: 05/16/2013 20:50  Assessment/Plan: S/P Procedure(s) (LRB): MINIMALLY INVASIVE MAZE PROCEDURE (N/A) INTRAOPERATIVE TRANSESOPHAGEAL ECHOCARDIOGRAM (N/A) MINIMALLY INVASIVE MITRAL VALVE (MV) REPLACEMENT (Right) 1 stable on vent, did not tol extubation. CXR opacities in right base may be somewhat improved 2 Volume overload- on lasix gtt, UO is good, creat has risen . Cardiac indecies adequate on current inotopic support, hopefully can tolerate milrinone wean. 3 chest tubes 380 cc/24 hours- continue for now 4 replace K+ per protocol 5 rhythm stable  6 H/H is borderline and may benefit from transfusion- some is dilutional effect 7 sugars with good control   [name],[name] E 05/18/2013 7:37 AM   I have seen and examined the patient and agree with the assessment and plan as outlined.  POD#2 s/p mitral valve replacement w/ bioprosthetic tissue valve + maze - hemodynamics very stable on low dose milrinone and dopamine, maintaining NSR - good LV systolic function but moderate aortic insufficiency, moderate LVH - PA pressures relatively low - will very slowly wean milrinone and continue dopamine @ 3 mcg/kg/min for now  Vent-dependent respiratory failure - good gas exchange and CXR  improved - will ask Pulm/CCM team to evaluate and assist w/ care.  May need another 24-48 hours of diuresis before trying acute vent wean again - no clinical signs of pneumonia although she has been hospitalized since 2/21 - will hold off on antibiotics for now  Acute exacerbation of chronic diastolic CHF w/ expected postop volume excess - reasonably good oxygenation, diuresing fairly well on lasix drip - increase dose to 12 mg/hr  Recurrent paroxysmal atrial fibrillation - maintaining NSR s/p maze - will resume amiodarone which was loaded pre-operatively  Expected post op acute blood loss anemia - Hgb down some overnight, will watch but plan to transfuse if it drops any further  Hypokalemia - diuretic induced - supplement aggressively while still on lasix drip  Post-op ileus - too early to start enteral feeds with long OR case, long CPB time - watch for signs of bowel function - start TNA in 1-2 days if no signs of bowel function and unable to wean off vent   [name],[name] H 05/18/2013 9:25 AM

## 2013-05-18 NOTE — Progress Notes (Signed)
CSW continuing to follow for discharge planning once patient is medically stable.   Jeanette Caprice, MSW, [place]

## 2013-05-18 NOTE — Consult Note (Signed)
PULMONARY / CRITICAL CARE MEDICINE   Name: [name] [name] MRN: [id number] DOB: 07-16-1940    ADMISSION DATE:  04/28/2013 CONSULTATION DATE:  05/18/2013  REFERRING MD :  Dr. Roxy Manns PRIMARY SERVICE: CVTS  CHIEF COMPLAINT:  Respiratory failure  BRIEF PATIENT DESCRIPTION: 73 year old female with extensive PMH including rheumatic heart disease who underwent a minimally invasive maze procedure.  Subsequently the patient was extubated but then developed respiratory failure and was reintubated.  PCCM was called on consultation for vent management.  SIGNIFICANT EVENTS / STUDIES:  3/11 Min invasive MAZE  LINES / TUBES: ET tube 3/11>>>3/12>>>3/12>>> L IJ SWAN 3/11 >>> L IJ TLC 3/11 >>>  CULTURES: Blood 3/13>>> Urine 3/13>>> Sputum 3/13>>>  ANTIBIOTICS: Cefepime 3/13>>> Vancomycin 3/13>>>  PAST MEDICAL HISTORY :  Past Medical History  Diagnosis Date  . Chronic airway obstruction, not elsewhere classified   . Precordial pain   . Swelling of limb   . Dizziness and giddiness   . Unspecified transient cerebral ischemia     Diag. 2003  . Acute, but ill-defined, cerebrovascular disease     2000 [place]  . Shortness of breath   . Obesity   . Juvenile rheumatic fever     age 26  . Unspecified essential hypertension   . Hyperlipidemia   . Mitral regurgitation 02/16/2013  . Chronic kidney disease   . CHF (congestive heart failure) 04/23/2013  . Paroxysmal atrial fibrillation 02/16/2013    Recurrent paroxysmal atrial fibrillation, first diagnosed December 2014  . Chronic diastolic congestive heart failure   . Arthritis     Chronic left knee pain  . Aortic insufficiency due to bicuspid aortic valve     moderate by TEE  . 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 S/P Maze operation for atrial fibrillation 05/16/2013    Complete  bilateral atrial lesion set using cryothermy and bipolar radiofrequency ablation with oversewing of LA appendage   Past Surgical History  Procedure Laterality Date  . Total knee arthroplasty Right   . Abdominal hysterectomy      Cervical Cancer  . Parathyroid/thyroid surgery      tumor  . Tee without cardioversion N/A 04/06/2013    Procedure: TRANSESOPHAGEAL ECHOCARDIOGRAM (TEE);  Surgeon: [name], MD;  [facility];  Service: Cardiology;  Laterality: N/A;  . Minimally invasive maze procedure N/A 05/16/2013    Procedure: MINIMALLY INVASIVE MAZE PROCEDURE;  Surgeon: Rexene Alberts, MD;  Location: [facility];  Service: Open Heart Surgery;  Laterality: N/A;  . Intraoperative transesophageal echocardiogram N/A 05/16/2013    Procedure: INTRAOPERATIVE TRANSESOPHAGEAL ECHOCARDIOGRAM;  Surgeon: Rexene Alberts, MD;  Location: [facility];  Service: Open Heart Surgery;  Laterality: N/A;  . Mitral valve replacement Right 05/16/2013    Procedure: MINIMALLY INVASIVE MITRAL VALVE (MV) REPLACEMENT;  Surgeon: Rexene Alberts, MD;  Location: [facility];  Service: Open Heart Surgery;  Laterality: Right;   Prior to Admission medications   Medication Sig Start Date End Date Taking? Authorizing Provider  acetaminophen (TYLENOL) 500 MG tablet Take 500 mg by mouth every 8 (eight) hours as needed for moderate pain.   Yes Historical Provider, MD  aspirin EC 81 MG tablet Take 81 mg by mouth daily.   Yes Historical Provider, MD  Calcium Carbonate-Vitamin D (CALTRATE 600+D PO) Take 1 tablet by mouth daily. Soft chew   Yes  Historical Provider, MD  furosemide (LASIX) 40 MG tablet Take 1.5 tablets (60 mg total) by mouth 2 (two) times daily. 03/07/13  Yes [name], MD  metoprolol tartrate (LOPRESSOR) 25 MG tablet Take 1 tablet (25 mg total) by mouth 2 (two) times daily. 03/07/13  Yes [name], MD  potassium chloride SA (K-DUR,KLOR-CON) 20 MEQ tablet Take 1 tablet (20 mEq total) by mouth 2 (two) times  daily. 03/07/13  Yes [name], MD  pravastatin (PRAVACHOL) 20 MG tablet Take 20 mg by mouth at bedtime.    Yes Historical Provider, MD  solifenacin (VESICARE) 5 MG tablet Take 5 mg by mouth daily.     Yes Historical Provider, MD   Allergies  Allergen Reactions  . Indomethacin Other (See Comments)    dizziness  . Norvasc [Amlodipine Besylate] Cough    FAMILY HISTORY:  Family History  Problem Relation Age of Onset  . Heart failure Father   . Heart attack Brother    SOCIAL HISTORY:  reports that she has never smoked. She has never used smokeless tobacco. She reports that she does not drink alcohol or use illicit drugs.  REVIEW OF SYSTEMS:  Unattainable, patient sedated and intubated.  SUBJECTIVE: Intubated and sedated.  VITAL SIGNS: Temp:  [97.3 F (36.3 C)-99.3 F (37.4 C)] 97.5 F (36.4 C) (03/13 1109) Pulse Rate:  [77-98] 83 (03/13 1109) Resp:  [11-27] 14 (03/13 1109) BP: (94-142)/(48-60) 121/52 mmHg (03/13 1100) SpO2:  [85 %-100 %] 100 % (03/13 1109) Arterial Line BP: (86-182)/(37-69) 150/56 mmHg (03/13 1100) FiO2 (%):  [40 %-55 %] 50 % (03/13 1109) Weight:  [218 lb 0.6 oz (98.9 kg)] 218 lb 0.6 oz (98.9 kg) (03/13 0500)  HEMODYNAMICS: PAP: (28-71)/(16-30) 42/19 mmHg CO:  [4.2 L/min-6.2 L/min] 4.5 L/min CI:  [2.3 L/min/m2-3.4 L/min/m2] 2.5 L/min/m2  VENTILATOR SETTINGS: Vent Mode:  [-] PRVC FiO2 (%):  [40 %-55 %] 50 % Set Rate:  [14 bmp-18 bmp] 14 bmp Vt Set:  [500 mL-550 mL] 500 mL PEEP:  [5 cmH20] 5 cmH20 Plateau Pressure:  [23 cmH20-26 cmH20] 26 cmH20  INTAKE / OUTPUT: Intake/Output     03/12 0701 - 03/13 0700 03/13 0701 - 03/14 0700   I.V. (mL/kg) 2265.6 (22.9) 272.4 (2.8)   Blood     NG/GT 60    IV Piggyback 250 200   Total Intake(mL/kg) 2575.6 (26) 472.4 (4.8)   Urine (mL/kg/hr) 2650 (1.1) 555 (0.9)   Blood     Chest Tube 380 (0.2) 70 (0.1)   Total Output 3030 625   Net -454.4 -152.6         PHYSICAL EXAMINATION: General:  Chronically  ill appearing female, sedated and intubated. Neuro:  Sedated but arouses easily. HEENT:  [place]/AT Cardiovascular:  RRR, sM LSB Lungs:  Diffuse crackles R>L Abdomen:  Soft, non-distended Musculoskeletal:  BLE edema Skin:  Intact  LABS:  CBC  Recent Labs Lab 05/17/13 0355 05/17/13 1700 05/18/13 0355  WBC 15.8* 16.3* 10.3  HGB 9.3* 9.4* 8.3*  HCT 27.0* 27.2* 23.7*  PLT 182 156 122*   Coag's  Recent Labs Lab 05/15/13 1837 05/16/13 1930  APTT 54* 33  INR 0.95 1.31   BMET  Recent Labs Lab 05/16/13 0602  05/16/13 1927 05/17/13 0330 05/17/13 1700 05/18/13 0355  NA 137  < > 138 143  --  141  K 4.1  < > 4.8 4.2  --  3.2*  CL 96  --   --  103  --  101  CO2 26  --   --  25  --  24  BUN 31*  --   --  21  --  23  CREATININE 1.52*  --   --  1.07 1.27* 1.31*  GLUCOSE 119*  < > 173* 114*  --  107*  < > = values in this interval not displayed. Electrolytes  Recent Labs Lab 05/16/13 0602 05/17/13 0330 05/17/13 1700 05/18/13 0355  CALCIUM 10.3 8.0*  --  9.1  MG  --  3.1* 2.8*  --    Sepsis Markers No results found for this basename: LATICACIDVEN, PROCALCITON, O2SATVEN,  in the last 168 hours ABG  Recent Labs Lab 05/17/13 1744 05/18/13 0459 05/18/13 0658  PHART 7.395 7.594* 7.453*  PCO2ART 40.3 27.3* 36.7  PO2ART 80.0 57.0* 84.0   Liver Enzymes  Recent Labs Lab 05/14/13 0935  AST 21  ALT 21  ALKPHOS 95  BILITOT 0.3  ALBUMIN 3.2*   Cardiac Enzymes No results found for this basename: TROPONINI, PROBNP,  in the last 168 hours Glucose  Recent Labs Lab 05/18/13 0456 05/18/13 0554 05/18/13 0655 05/18/13 0800 05/18/13 0909 05/18/13 1132  GLUCAP 100* 114* 111* 117* 102* 119*    Imaging Dg Chest Port 1 View  05/18/2013   CLINICAL DATA:  Followup atelectasis.  CHF.  EXAM: PORTABLE CHEST - 1 VIEW  COMPARISON:  05/17/2013  FINDINGS: Support devices are unchanged. Patchy right lung and left basilar airspace opacities are again noted, not significantly  changed. Suspect small right effusion. Mild cardiomegaly. No acute bony abnormality.  IMPRESSION: No significant change since prior study.   Electronically Signed   By: Rolm Baptise M.D.   On: 05/18/2013 08:30   Dg Chest Port 1 View  05/17/2013   CLINICAL DATA:  Status post intubation.  EXAM: PORTABLE CHEST - 1 VIEW  COMPARISON:  May 17, 2013.  FINDINGS: Endotracheal tube is in grossly good position with distal tip 2 cm above the carina. Nasogastric tube passes through the esophagus and into the stomach. At least 2 right-sided chest tubes are noted without evidence of pneumothorax. Right base opacity is slightly improved consistent with atelectasis, pneumonia or effusion. Left lung is clear. Left internal jugular Swan-Ganz catheter is noted with tip directed toward right pulmonary artery. Another left internal jugular catheter is noted with distal tip at the SVC.  IMPRESSION: Endotracheal tube in grossly good position. Right basilar opacity is slightly improved compared to prior exam. No pneumothorax is seen.   Electronically Signed   By: Sabino Dick M.D.   On: 05/17/2013 18:59   Dg Chest Portable 1 View In Am  05/17/2013   CLINICAL DATA Mitral valve disorder, postop film  EXAM PORTABLE CHEST - 1 VIEW  COMPARISON DG CHEST 1V PORT dated 05/16/2013; DG CHEST 2 VIEW dated 05/13/2013; CT ANGIO CHEST AORTA W/CM & WO/CM dated 05/07/2013  FINDINGS Support devices in unchanged position. Three right-sided chest tubes now present. Mitral valve replacement noted. Significant cardiac enlargement stable. Extensive pleural-parenchymal opacity right mid to lower lung zone, unchanged. Increased left lower lobe consolidation. Stable mild interstitial pulmonary edema.  IMPRESSION Severe stable pleural parenchymal opacity right mid to lower lung zone. Increase severity of left lower lobe consolidation and possible left effusion.  SIGNATURE  Electronically Signed   By: Skipper Cliche M.D.   On: 05/17/2013 08:03   Dg Chest  Portable 1 View  05/16/2013   CLINICAL DATA Postop mitral valve replacement.  EXAM PORTABLE CHEST - 1 VIEW  COMPARISON DG CHEST 2 VIEW dated 05/13/2013; CT ANGIO CHEST AORTA W/CM & WO/CM dated 05/07/2013; DG CHEST 1V PORT dated 05/05/2013; DG CHEST 1V PORT dated 05/03/2013  FINDINGS Endotracheal tube tip in satisfactory position approximately 3-4 cm above the carina. Swan-Ganz catheter tip projects over the left main pulmonary artery. Left jugular central venous catheter tip projects over the junction of the innominate vein and SVC. Right chest tube in place with no pneumothorax. Cardiac silhouette enlarged. Pulmonary venous hypertension with mild interstitial pulmonary edema. Dense consolidation in the right lower lobe and right middle lobe.  IMPRESSION 1. Support apparatus satisfactory. 2. No pneumothorax. 3. Dense atelectasis involving the right middle lobe and right lower lobe. Mild interstitial pulmonary edema.  SIGNATURE  Electronically Signed   By: Evangeline Dakin M.D.   On: 05/16/2013 20:50     CXR: Bilateral infiltrates R > L  ASSESSMENT / PLAN:  PULMONARY A:  Acute Respiratory Failure likely multifactorial - bilateral infiltrates, pulmonary edema.  Pulmonary Edema 2/2 acute no chronic diastolic chf  P:   - Full mechanical ventilatory support - Pulmonary hygeine  - Follow CXR - Continue diureses, will need aggressive diureses prior to extubation. - Abx and cultures as per ID section.  CARDIOVASCULAR A:  Acute on Chronic diastolic CHF s/p MAZE procedure Atrial Fibrillation P:  - Management per Cardiology. - On lasix gtt 12 mg/hr.  RENAL A:   Acute renal insufficiency Hypokalemia P:   - Strict I&O. - Monitor BMP. - KVO IVF. - Replace electrolytes as needed. - Lasix 12 mg/hr drip.  GASTROINTESTINAL A:   Nutrition  P:   - PPI. - Start zaroxolyn 5mg  daily.  - Consult nutrition for TF, but hold for now per CVTS recommendations.  HEMATOLOGIC A:   Anemia  P:  -  Transfuse per primary team.  INFECTIOUS A:   Possible HCAP in setting of increased bilateral infiltrates  P:   - Vanc and cefepime. - Follow cultures. - Check PCT. - Monitor WBC and fever curve.  ENDOCRINE A:   No acute issue    P:   - Monitor CBG q4 hours while intubated.  NEUROLOGIC A:   Acute encephalopathy in setting of respiratory failure  P:   - Supportive care  TODAY'S SUMMARY: VDRF, RLL infiltrate evident, fluid overloaded.  Lasix drip, abx and culture.  Begin PS trails in AM.  I have personally obtained a history, examined the patient, evaluated laboratory and imaging results, formulated the assessment and plan and placed orders.  CRITICAL CARE: The patient is critically ill with multiple organ systems failure and requires high complexity decision making for assessment and support, frequent evaluation and titration of therapies, application of advanced monitoring technologies and extensive interpretation of multiple databases. Critical Care Time devoted to patient care services described in this note is 45 minutes.   05/18/2013, 1:05 PM  Rush Farmer, M.D. [facility]. Pager: [phone]. After hours pager: [phone].

## 2013-05-18 NOTE — Progress Notes (Signed)
ANTIBIOTIC CONSULT NOTE - INITIAL  Pharmacy Consult for vancomycin + cefepime Indication: rule out pneumonia  Allergies  Allergen Reactions  . Indomethacin Other (See Comments)    dizziness  . Norvasc [Amlodipine Besylate] Cough    Patient Measurements: Height: 5\' 2"  (157.5 cm) Weight: 218 lb 0.6 oz (98.9 kg) IBW/kg (Calculated) : 50.1 Adjusted Body Weight:   Vital Signs: Temp: 97.5 F (36.4 C) (03/13 1109) Temp src: Core (Comment) (03/13 1100) BP: 121/52 mmHg (03/13 1100) Pulse Rate: 83 (03/13 1109) Intake/Output from previous day: 03/12 0701 - 03/13 0700 In: 2575.6 [I.V.:2265.6; NG/GT:60; IV Piggyback:250] Out: [id number] [Urine:2650; Chest Tube:380] Intake/Output from this shift: Total I/O In: 472.4 [I.V.:272.4; IV Piggyback:200] Out: 625 [Urine:555; Chest Tube:70]  Labs:  Recent Labs  05/17/13 0330 05/17/13 0355 05/17/13 1700 05/18/13 0355  WBC  --  15.8* 16.3* 10.3  HGB  --  9.3* 9.4* 8.3*  PLT  --  182 156 122*  CREATININE 1.07  --  1.27* 1.31*   Estimated Creatinine Clearance: 42.7 ml/min (by C-G formula based on Cr of 1.31). No results found for this basename: VANCOTROUGH, Corlis Leak, VANCORANDOM, [place], GENTPEAK, GENTRANDOM, TOBRATROUGH, TOBRAPEAK, TOBRARND, AMIKACINPEAK, AMIKACINTROU, AMIKACIN,  in the last 72 hours   Microbiology: Recent Results (from the past 720 hour(s))  MRSA PCR SCREENING     Status: None   Collection Time    05/01/13 11:45 AM      Result Value Ref Range Status   MRSA by PCR NEGATIVE  NEGATIVE Final   Comment:            The GeneXpert MRSA Assay (FDA     approved for NASAL specimens     only), is one component of a     comprehensive MRSA colonization     surveillance program. It is not     intended to diagnose MRSA     infection nor to guide or     monitor treatment for     MRSA infections.  CLOSTRIDIUM DIFFICILE BY PCR     Status: None   Collection Time    05/13/13 10:24 PM      Result Value Ref Range Status   C  difficile by pcr NEGATIVE  NEGATIVE Final  SURGICAL PCR SCREEN     Status: None   Collection Time    05/15/13  6:10 AM      Result Value Ref Range Status   MRSA, PCR NEGATIVE  NEGATIVE Final   Staphylococcus aureus NEGATIVE  NEGATIVE Final   Comment:            The Xpert SA Assay (FDA     approved for NASAL specimens     in patients over 32 years of age),     is one component of     a comprehensive surveillance     program.  Test performance has     been validated by [employer] for patients greater     than or equal to 80 year old.     It is not intended     to diagnose infection nor to     guide or monitor treatment.    Medical History: Past Medical History  Diagnosis Date  . Chronic airway obstruction, not elsewhere classified   . Precordial pain   . Swelling of limb   . Dizziness and giddiness   . Unspecified transient cerebral ischemia     Diag. 2003  . Acute, but ill-defined, cerebrovascular disease  2000 [facility]  . Shortness of breath   . Obesity   . Juvenile rheumatic fever     age 23  . Unspecified essential hypertension   . Hyperlipidemia   . Mitral regurgitation 02/16/2013  . Chronic kidney disease   . CHF (congestive heart failure) 04/23/2013  . Paroxysmal atrial fibrillation 02/16/2013    Recurrent paroxysmal atrial fibrillation, first diagnosed December 2014  . Chronic diastolic congestive heart failure   . Arthritis     Chronic left knee pain  . Aortic insufficiency due to bicuspid aortic valve     moderate by TEE  . 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 S/P Maze operation for atrial fibrillation 05/16/2013    Complete bilateral atrial lesion set using cryothermy and bipolar radiofrequency ablation with oversewing of LA appendage    Medications:  Anti-infectives   Start     Dose/Rate  Route Frequency Ordered Stop   05/18/13 1400  ceFEPIme (MAXIPIME) 1 g in dextrose 5 % 50 mL IVPB     1 g 100 mL/hr over 30 Minutes Intravenous Every 24 hours 05/18/13 1335     05/18/13 1400  vancomycin (VANCOCIN) 1,250 mg in sodium chloride 0.9 % 250 mL IVPB     1,250 mg 166.7 mL/hr over 90 Minutes Intravenous Every 24 hours 05/18/13 1335     05/16/13 2200  vancomycin (VANCOCIN) IVPB 1000 mg/200 mL premix     1,000 mg 200 mL/hr over 60 Minutes Intravenous  Once 05/16/13 1907 05/16/13 2308   05/16/13 1930  cefUROXime (ZINACEF) 1.5 g in dextrose 5 % 50 mL IVPB     1.5 g 100 mL/hr over 30 Minutes Intravenous Every 12 hours 05/16/13 1907 05/18/13 0720   05/16/13 0400  vancomycin (VANCOCIN) 1,500 mg in sodium chloride 0.9 % 250 mL IVPB     1,500 mg 125 mL/hr over 120 Minutes Intravenous To Surgery 05/15/13 1829 05/16/13 0930   05/16/13 0400  cefUROXime (ZINACEF) 1.5 g in dextrose 5 % 50 mL IVPB     1.5 g 100 mL/hr over 30 Minutes Intravenous To Surgery 05/15/13 1829 05/16/13 1648   05/16/13 0400  cefUROXime (ZINACEF) 750 mg in dextrose 5 % 50 mL IVPB  Status:  Discontinued     750 mg 100 mL/hr over 30 Minutes Intravenous To Surgery 05/15/13 1829 05/16/13 1840   05/16/13 0400  vancomycin (VANCOCIN) 1,000 mg in sodium chloride 0.9 % 1,000 mL irrigation      Irrigation To Surgery 05/15/13 1829 05/16/13 0826   04/28/13 0200  cefTRIAXone (ROCEPHIN) 1 g in dextrose 5 % 50 mL IVPB  Status:  Discontinued     1 g 100 mL/hr over 30 Minutes Intravenous  Once 04/28/13 0153 04/28/13 0207   04/28/13 0200  azithromycin (ZITHROMAX) 500 mg in dextrose 5 % 250 mL IVPB  Status:  Discontinued     500 mg 250 mL/hr over 60 Minutes Intravenous  Once 04/28/13 0153 04/28/13 0207     Assessment: 73 yof POD#2 MVR, MAZE to start empiric vancomycin + cefepime for possible pneumonia. Pt is afebrile and WBC is WNL. Scr has had a slight trend up the last couple days. Cultures have been ordered.   Vanc 3/13>> Cefepime  3/13>>  3/10 MRSA - NEG 3/8 CDiff - NEG  Goal of Therapy:  Vancomycin trough level 15-20 mcg/ml  Plan:  1.  Vancomycin 1250mg  IV Q24H 2. Cefepime 1gm IV Q24H 3. F/u renal fxn, C&S, clinical status and trough at [place], Rande Lawman 05/18/2013,1:35 PM

## 2013-05-19 ENCOUNTER — Inpatient Hospital Stay (HOSPITAL_COMMUNITY): Payer: Medicare HMO

## 2013-05-19 LAB — BASIC METABOLIC PANEL
BUN: 26 mg/dL — ABNORMAL HIGH (ref 6–23)
BUN: 30 mg/dL — ABNORMAL HIGH (ref 6–23)
CO2: 24 mEq/L (ref 19–32)
CO2: 24 mEq/L (ref 19–32)
Calcium: 9.2 mg/dL (ref 8.4–10.5)
Calcium: 8.9 mg/dL (ref 8.4–10.5)
Chloride: 102 mEq/L (ref 96–112)
Chloride: 100 mEq/L (ref 96–112)
Creatinine, Ser: 1.35 mg/dL — ABNORMAL HIGH (ref 0.50–1.10)
Creatinine, Ser: 1.38 mg/dL — ABNORMAL HIGH (ref 0.50–1.10)
GFR calc Af Amer: 44 mL/min — ABNORMAL LOW (ref >90)
GFR calc Af Amer: 43 mL/min — ABNORMAL LOW (ref >90)
GFR calc non Af Amer: 38 mL/min — ABNORMAL LOW (ref >90)
GFR calc non Af Amer: 37 mL/min — ABNORMAL LOW (ref >90)
Glucose, Bld: 143 mg/dL — ABNORMAL HIGH (ref 70–99)
Glucose, Bld: 125 mg/dL — ABNORMAL HIGH (ref 70–99)
Potassium: 3.2 mEq/L — ABNORMAL LOW (ref 3.7–5.3)
Potassium: 3.3 mEq/L — ABNORMAL LOW (ref 3.7–5.3)
Sodium: 142 mEq/L (ref 137–147)
Sodium: 140 mEq/L (ref 137–147)

## 2013-05-19 LAB — TYPE AND SCREEN
ABO/RH(D): B POS
Antibody Screen: NEGATIVE
UNIT DIVISION: 0
UNIT DIVISION: 0
UNIT DIVISION: 0
UNIT DIVISION: 0
Unit division: 0
Unit division: 0
Unit division: 0
Unit division: 0

## 2013-05-19 LAB — POCT I-STAT 3, ART BLOOD GAS (G3+)
Acid-Base Excess: 3 mmol/L — ABNORMAL HIGH (ref 0.0–2.0)
BICARBONATE: 25.9 meq/L — AB (ref 20.0–24.0)
O2 SAT: 98 %
TCO2: 27 mmol/L (ref 0–100)
pCO2 arterial: 30.2 mmHg — ABNORMAL LOW (ref 35.0–45.0)
pH, Arterial: 7.542 — ABNORMAL HIGH (ref 7.350–7.450)
pO2, Arterial: 93 mmHg (ref 80.0–100.0)

## 2013-05-19 LAB — URINE CULTURE
Colony Count: NO GROWTH
Culture: NO GROWTH

## 2013-05-19 LAB — GLUCOSE, CAPILLARY
GLUCOSE-CAPILLARY: 106 mg/dL — AB (ref 70–99)
GLUCOSE-CAPILLARY: 118 mg/dL — AB (ref 70–99)
Glucose-Capillary: 139 mg/dL — ABNORMAL HIGH (ref 70–99)
Glucose-Capillary: 120 mg/dL — ABNORMAL HIGH (ref 70–99)
Glucose-Capillary: 90 mg/dL (ref 70–99)

## 2013-05-19 LAB — CBC
HCT: 24.7 % — ABNORMAL LOW (ref 36.0–46.0)
HCT: 24.8 % — ABNORMAL LOW (ref 36.0–46.0)
Hemoglobin: 8.7 g/dL — ABNORMAL LOW (ref 12.0–15.0)
Hemoglobin: 8.7 g/dL — ABNORMAL LOW (ref 12.0–15.0)
MCH: 31.6 pg (ref 26.0–34.0)
MCH: 32 pg (ref 26.0–34.0)
MCHC: 35.2 g/dL (ref 30.0–36.0)
MCHC: 35.1 g/dL (ref 30.0–36.0)
MCV: 89.8 fL (ref 78.0–100.0)
MCV: 91.2 fL (ref 78.0–100.0)
Platelets: 126 10*3/uL — ABNORMAL LOW (ref 150–400)
Platelets: 114 10*3/uL — ABNORMAL LOW (ref 150–400)
RBC: 2.75 MIL/uL — ABNORMAL LOW (ref 3.87–5.11)
RBC: 2.72 MIL/uL — ABNORMAL LOW (ref 3.87–5.11)
RDW: 14.9 % (ref 11.5–15.5)
RDW: 15 % (ref 11.5–15.5)
WBC: 11.2 10*3/uL — ABNORMAL HIGH (ref 4.0–10.5)
WBC: 11.6 10*3/uL — ABNORMAL HIGH (ref 4.0–10.5)

## 2013-05-19 LAB — PHOSPHORUS: Phosphorus: 4.1 mg/dL (ref 2.3–4.6)

## 2013-05-19 LAB — MAGNESIUM: Magnesium: 2.1 mg/dL (ref 1.5–2.5)

## 2013-05-19 MED ORDER — FENTANYL CITRATE 0.05 MG/ML IJ SOLN
12.5000 ug | INTRAMUSCULAR | Status: DC | PRN
  Administered 2013-05-22: 25 ug via INTRAVENOUS
  Filled 2013-05-19: qty 2

## 2013-05-19 MED ORDER — POTASSIUM CHLORIDE 10 MEQ/50ML IV SOLN
10.0000 meq | INTRAVENOUS | Status: AC | PRN
  Administered 2013-05-19 – 2013-05-20 (×3): 10 meq via INTRAVENOUS
  Filled 2013-05-19 (×2): qty 50

## 2013-05-19 MED ORDER — POTASSIUM CHLORIDE 10 MEQ/50ML IV SOLN
10.0000 meq | INTRAVENOUS | Status: AC
  Administered 2013-05-19 (×5): 10 meq via INTRAVENOUS
  Filled 2013-05-19 (×5): qty 50

## 2013-05-19 MED ORDER — POTASSIUM CHLORIDE 10 MEQ/50ML IV SOLN
10.0000 meq | INTRAVENOUS | Status: AC
  Administered 2013-05-19 (×2): 10 meq via INTRAVENOUS
  Filled 2013-05-19 (×2): qty 50

## 2013-05-19 MED ORDER — SODIUM CHLORIDE 0.9 % IJ SOLN: 10.0000 mL | INTRAMUSCULAR | Status: DC | PRN

## 2013-05-19 MED ORDER — SODIUM CHLORIDE 0.9 % IJ SOLN
10.0000 mL | Freq: Two times a day (BID) | INTRAMUSCULAR | Status: DC
  Administered 2013-05-19 – 2013-05-22 (×4): 10 mL

## 2013-05-19 NOTE — Plan of Care (Signed)
Problem: Phase II - Intermediate Post-Op Goal: Maintain Hemodynamic Stability Outcome: Progressing CI > 2, weaned dopamine to 2 mcg and milrinone is at 0.2. Goal: Advance Diet Outcome: Progressing Started with ice chips.

## 2013-05-19 NOTE — Progress Notes (Signed)
3 Days Post-Op Procedure(s) (LRB): MINIMALLY INVASIVE MAZE PROCEDURE (N/A) INTRAOPERATIVE TRANSESOPHAGEAL ECHOCARDIOGRAM (N/A) MINIMALLY INVASIVE MITRAL VALVE (MV) REPLACEMENT (Right) Subjective:  Postop mitral valve replacement with bioprosthetic valve and combined maze procedure Patient remains intubated with VDRF after reintubation on postop day 1 Spontaneous breathing test this a.m. resulted in tachypnea with low tidal volumes and placed back on rest mode Chest x-ray is clearing with aggressive diuresis Maintaining sinus rhythm on IV amiodarone Neuro appears intact although patient remains on sedation Minimal chest tube drainage Mild postop anemia Objective: Vital signs in last 24 hours: Temp:  [97.7 F (36.5 C)-98.4 F (36.9 C)] 98.2 F (36.8 C) (03/14 0900) Pulse Rate:  [62-94] 62 (03/14 0900) Cardiac Rhythm:  [-] Normal sinus rhythm (03/14 0800) Resp:  [0-23] 23 (03/14 0900) BP: (96-153)/(44-61) 103/45 mmHg (03/14 0900) SpO2:  [97 %-100 %] 99 % (03/14 0903) Arterial Line BP: (93-152)/(37-59) 128/45 mmHg (03/14 0900) FiO2 (%):  [40 %-50 %] 40 % (03/14 0857) Weight:  [219 lb 5.7 oz (99.5 kg)] 219 lb 5.7 oz (99.5 kg) (03/14 0240)  Hemodynamic parameters for last 24 hours: PAP: (31-43)/(15-30) 35/27 mmHg CO:  [3.5 L/min-4.6 L/min] 3.5 L/min CI:  [1.9 L/min/m2-2.5 L/min/m2] 1.9 L/min/m2  Intake/Output from previous day: 03/13 0701 - 03/14 0700 In: 3060.9 [I.V.:2210.9; IV Piggyback:850] Out: 9798 [XQJJH:4174; Chest Tube:190] Intake/Output this shift: Total I/O In: 399 [I.V.:349; IV Piggyback:50] Out: 935 [Urine:915; Chest Tube:20]  Patient breathing comfortably Lungs clear  No murmur Mild edema  Lab Results:  Recent Labs  05/18/13 0355 05/19/13 0350  WBC 10.3 11.2*  HGB 8.3* 8.7*  HCT 23.7* 24.7*  PLT 122* 126*   BMET:  Recent Labs  05/18/13 1559 05/19/13 0350  NA 141 142  K 3.5* 3.2*  CL 104 102  CO2 24 24  GLUCOSE 175* 143*  BUN 26* 26*   CREATININE 1.33* 1.35*  CALCIUM 8.7 9.2    PT/INR:  Recent Labs  05/16/13 1930  LABPROT 16.0*  INR 1.31   ABG    Component Value Date/Time   PHART 7.542* 05/19/2013 0503   HCO3 25.9* 05/19/2013 0503   TCO2 27 05/19/2013 0503   ACIDBASEDEF 2.0 05/17/2013 1626   O2SAT 98.0 05/19/2013 0503   CBG (last 3)   Recent Labs  05/18/13 2337 05/19/13 0353 05/19/13 0741  GLUCAP 136* 139* 120*    Assessment/Plan: S/P Procedure(s) (LRB): MINIMALLY INVASIVE MAZE PROCEDURE (N/A) INTRAOPERATIVE TRANSESOPHAGEAL ECHOCARDIOGRAM (N/A) MINIMALLY INVASIVE MITRAL VALVE (MV) REPLACEMENT (Right) Only patient can be extubated soon Continue low dose dopamine and milrinone    LOS: 21 days    VAN TRIGT III,PETER 05/19/2013

## 2013-05-19 NOTE — Progress Notes (Signed)
PULMONARY / CRITICAL CARE MEDICINE   Name: [name] [name] MRN: [id number] DOB: November 10, 1940    ADMISSION DATE:  04/28/2013 CONSULTATION DATE:  05/18/2013  REFERRING MD :  Dr. Roxy Manns PRIMARY SERVICE: CVTS  CHIEF COMPLAINT:  Respiratory failure  BRIEF PATIENT DESCRIPTION:  73 year old female with extensive PMH including rheumatic heart disease who underwent a minimally invasive maze procedure.  Subsequently the patient was extubated but then developed respiratory failure and was reintubated.  PCCM was called on consultation for vent management.  SIGNIFICANT EVENTS / STUDIES:  3/11 Min invasive MAZE  LINES / TUBES: ET tube 3/11>>>3/12, 3/12>> 3/14 L IJ PAC 3/11 >>  L IJ sleeve 3/11 >>   CULTURES: Blood 3/13>>> Urine 3/13>>> Sputum 3/13>>>  ANTIBIOTICS: Cefepime 3/13>>> Vancomycin 3/13>>>   SUBJECTIVE:  RASS 0. + F/C. Passed SBT. Extubated and looks good immediately post extubation  VITAL SIGNS: Temp:  [97.9 F (36.6 C)-99 F (37.2 C)] 99 F (37.2 C) (03/14 1500) Pulse Rate:  [62-120] 87 (03/14 1500) Resp:  [0-27] 21 (03/14 1500) BP: (72-153)/(40-61) 108/49 mmHg (03/14 1500) SpO2:  [93 %-100 %] 100 % (03/14 1515) Arterial Line BP: (93-152)/(37-59) 125/48 mmHg (03/14 1500) FiO2 (%):  [40 %-50 %] 40 % (03/14 1233) Weight:  [99.5 kg (219 lb 5.7 oz)] 99.5 kg (219 lb 5.7 oz) (03/14 0240)  HEMODYNAMICS: PAP: (31-42)/(15-30) 40/19 mmHg CO:  [3.5 L/min-4.2 L/min] 3.5 L/min CI:  [1.9 L/min/m2-2.4 L/min/m2] 1.9 L/min/m2  VENTILATOR SETTINGS: Vent Mode:  [-] PSV;CPAP FiO2 (%):  [40 %-50 %] 40 % Set Rate:  [12 bmp] 12 bmp Vt Set:  [400 mL-500 mL] 400 mL PEEP:  [5 cmH20] 5 cmH20 Pressure Support:  [10 cmH20] 10 cmH20 Plateau Pressure:  [21 cmH20-26 cmH20] 21 cmH20  INTAKE / OUTPUT: Intake/Output     03/13 0701 - 03/14 0700 03/14 0701 - 03/15 0700   I.V. (mL/kg) 2210.9 (22.2) 612.9 (6.2)   NG/GT     IV Piggyback 850 200   Total Intake(mL/kg) 3060.9 (30.8) 812.9  (8.2)   Urine (mL/kg/hr) 3995 (1.7) 1420 (1.5)   Chest Tube 190 (0.1) 70 (0.1)   Total Output 4185 1490   Net -1124.1 -677.1         PHYSICAL EXAMINATION: General:  NAD Neuro: No focal deficits HEENT: WNL Cardiovascular:  RRR, sM LSB  Lungs:  Clear anteriorly Abdomen:  Soft, non-distended Musculoskeletal:  Symmetric BLE edema   LABS: I have reviewed all of today's lab results. Relevant abnormalities are discussed in the A/P section   CXR: RLL opacity. improved edema  ASSESSMENT / PLAN:  PULMONARY A:  Acute Respiratory Failure  Pulmonary Edema - improved  P:   Monitor in ICU post extubation Supplemental O2  CARDIOVASCULAR A:  Acute on Chronic diastolic CHF s/p MAZE procedure Atrial Fibrillation P:  Management per Cardiology/TCTS.  RENAL A:   Acute renal insufficiency Hypokalemia P:   Monitor BMET intermittently Monitor I/Os Correct electrolytes as indicated   GASTROINTESTINAL A:   Nutrition  P:   NPO post extubation  HEMATOLOGIC A:   Anemia  P:  Mgmt per TCTS  INFECTIOUS A:   Possible HCAP in setting of increased bilateral infiltrates  P:   Micro and abx as above  ENDOCRINE A:   No acute issue    P:   SSI ordered by TCTS  NEUROLOGIC A:   Acute encephalopathy, resolved  P:   Supportive care  TODAY'S SUMMARY: VDRF, RLL infiltrate evident, fluid overloaded.  Lasix drip, abx  and culture.  Begin PS trails in AM.  I have personally obtained a history, examined the patient, evaluated laboratory and imaging results, formulated the assessment and plan and placed orders.  CRITICAL CARE: The patient is critically ill with multiple organ systems failure and requires high complexity decision making for assessment and support, frequent evaluation and titration of therapies, application of advanced monitoring technologies and extensive interpretation of multiple databases. Critical Care Time devoted to patient care services described in this  note is 30 minutes.   05/19/2013, 4:14 PM  Merton Border, MD ; [facility] service [place] [phone].  After 5:30 PM or weekends, call [phone]

## 2013-05-19 NOTE — Progress Notes (Signed)
Respiratory therapy note-failed wean due to low VT and RR-30's.

## 2013-05-19 NOTE — Procedures (Signed)
Extubation Procedure Note  Patient Details:   Name: [name] [name] DOB: 1941/01/22 MRN: [id number]   Airway Documentation:  Airway 8 mm (Active)  Secured at (cm) 24 cm 05/19/2013 12:33 PM  Measured From Lips 05/19/2013 12:33 PM  [place] 05/19/2013 12:33 PM  Secured By [employer] 05/19/2013 12:33 PM  Tube Holder Repositioned Yes 05/19/2013 12:33 PM  Site Condition Dry 05/19/2013 12:33 PM    Evaluation  O2 sats: stable throughout Complications: No apparent complications Patient did tolerate procedure well. Bilateral Breath Sounds: Clear;Diminished;Coarse crackles Suctioning: Airway Yes Dr. Alva Garnet here to assess patient, orders to extubate Placed on 4l/min [place]  [name] 05/19/2013, 1:15 PM

## 2013-05-19 NOTE — Progress Notes (Signed)
Elink MD Deterding made aware of morning ABG, new orders received.

## 2013-05-19 NOTE — Progress Notes (Signed)
MD Bartle notified of morning ABG results, new orders received for vent changes and to re-check ABG in 1 hour. Will implement changes and continue to monitor.  Rolla Plate

## 2013-05-20 ENCOUNTER — Inpatient Hospital Stay (HOSPITAL_COMMUNITY): Payer: Medicare HMO

## 2013-05-20 LAB — POCT I-STAT 3, ART BLOOD GAS (G3+)
ACID-BASE EXCESS: 2 mmol/L (ref 0.0–2.0)
Bicarbonate: 26.8 mEq/L — ABNORMAL HIGH (ref 20.0–24.0)
O2 SAT: 94 %
PCO2 ART: 42.1 mmHg (ref 35.0–45.0)
Patient temperature: 37.2
TCO2: 28 mmol/L (ref 0–100)
pH, Arterial: 7.413 (ref 7.350–7.450)
pO2, Arterial: 71 mmHg — ABNORMAL LOW (ref 80.0–100.0)

## 2013-05-20 LAB — CBC
HCT: 25.3 % — ABNORMAL LOW (ref 36.0–46.0)
Hemoglobin: 8.7 g/dL — ABNORMAL LOW (ref 12.0–15.0)
MCH: 31.6 pg (ref 26.0–34.0)
MCHC: 34.4 g/dL (ref 30.0–36.0)
MCV: 92 fL (ref 78.0–100.0)
Platelets: 128 10*3/uL — ABNORMAL LOW (ref 150–400)
RBC: 2.75 MIL/uL — ABNORMAL LOW (ref 3.87–5.11)
RDW: 15 % (ref 11.5–15.5)
WBC: 11.7 10*3/uL — ABNORMAL HIGH (ref 4.0–10.5)

## 2013-05-20 LAB — BASIC METABOLIC PANEL
BUN: 32 mg/dL — ABNORMAL HIGH (ref 6–23)
BUN: 34 mg/dL — ABNORMAL HIGH (ref 6–23)
CO2: 25 mEq/L (ref 19–32)
CO2: 26 mEq/L (ref 19–32)
Calcium: 9.3 mg/dL (ref 8.4–10.5)
Calcium: 8.7 mg/dL (ref 8.4–10.5)
Chloride: 97 mEq/L (ref 96–112)
Chloride: 97 mEq/L (ref 96–112)
Creatinine, Ser: 1.33 mg/dL — ABNORMAL HIGH (ref 0.50–1.10)
Creatinine, Ser: 1.33 mg/dL — ABNORMAL HIGH (ref 0.50–1.10)
GFR calc Af Amer: 45 mL/min — ABNORMAL LOW (ref >90)
GFR calc Af Amer: 45 mL/min — ABNORMAL LOW (ref >90)
GFR calc non Af Amer: 39 mL/min — ABNORMAL LOW (ref >90)
GFR calc non Af Amer: 39 mL/min — ABNORMAL LOW (ref >90)
Glucose, Bld: 125 mg/dL — ABNORMAL HIGH (ref 70–99)
Glucose, Bld: 200 mg/dL — ABNORMAL HIGH (ref 70–99)
Potassium: 3.3 mEq/L — ABNORMAL LOW (ref 3.7–5.3)
Potassium: 3.1 mEq/L — ABNORMAL LOW (ref 3.7–5.3)
Sodium: 141 mEq/L (ref 137–147)
Sodium: 138 mEq/L (ref 137–147)

## 2013-05-20 LAB — GLUCOSE, CAPILLARY
GLUCOSE-CAPILLARY: 119 mg/dL — AB (ref 70–99)
GLUCOSE-CAPILLARY: 133 mg/dL — AB (ref 70–99)
Glucose-Capillary: 114 mg/dL — ABNORMAL HIGH (ref 70–99)
Glucose-Capillary: 116 mg/dL — ABNORMAL HIGH (ref 70–99)
Glucose-Capillary: 113 mg/dL — ABNORMAL HIGH (ref 70–99)
Glucose-Capillary: 106 mg/dL — ABNORMAL HIGH (ref 70–99)

## 2013-05-20 LAB — PROTIME-INR
INR: 1.16 (ref 0.00–1.49)
Prothrombin Time: 14.6 s (ref 11.6–15.2)

## 2013-05-20 LAB — CLOSTRIDIUM DIFFICILE BY PCR: Toxigenic C. Difficile by PCR: NEGATIVE

## 2013-05-20 MED ORDER — POTASSIUM CHLORIDE 10 MEQ/50ML IV SOLN
INTRAVENOUS | Status: AC
  Administered 2013-05-20: 10 meq
  Filled 2013-05-20: qty 50

## 2013-05-20 MED ORDER — INSULIN ASPART 100 UNIT/ML ~~LOC~~ SOLN
0.0000 [IU] | Freq: Three times a day (TID) | SUBCUTANEOUS | Status: DC
Start: 2013-05-20 — End: 2013-05-21

## 2013-05-20 MED ORDER — POTASSIUM CHLORIDE 10 MEQ/50ML IV SOLN
INTRAVENOUS | Status: AC
  Administered 2013-05-20: 10 meq via INTRAVENOUS
  Filled 2013-05-20: qty 50

## 2013-05-20 MED ORDER — POTASSIUM CHLORIDE 10 MEQ/50ML IV SOLN
10.0000 meq | INTRAVENOUS | Status: AC
  Administered 2013-05-20 (×2): 10 meq via INTRAVENOUS
  Filled 2013-05-20: qty 50

## 2013-05-20 MED ORDER — POTASSIUM CHLORIDE 10 MEQ/50ML IV SOLN
10.0000 meq | INTRAVENOUS | Status: AC
  Administered 2013-05-20 – 2013-05-21 (×2): 10 meq via INTRAVENOUS

## 2013-05-20 MED ORDER — POTASSIUM CHLORIDE 10 MEQ/50ML IV SOLN
10.0000 meq | INTRAVENOUS | Status: AC | PRN
  Administered 2013-05-20 (×4): 10 meq via INTRAVENOUS
  Filled 2013-05-20 (×2): qty 50

## 2013-05-20 MED ORDER — WARFARIN - PHYSICIAN DOSING INPATIENT
Freq: Every day | Status: DC
  Administered 2013-05-23 – 2013-05-28 (×4)

## 2013-05-20 MED ORDER — POTASSIUM CHLORIDE 10 MEQ/50ML IV SOLN
10.0000 meq | INTRAVENOUS | Status: AC
  Filled 2013-05-20: qty 50

## 2013-05-20 MED ORDER — WARFARIN SODIUM 2.5 MG PO TABS
2.5000 mg | ORAL_TABLET | Freq: Once | ORAL | Status: AC
  Administered 2013-05-20: 2.5 mg via ORAL
  Filled 2013-05-20: qty 1

## 2013-05-20 MED ORDER — AMIODARONE HCL 200 MG PO TABS
400.0000 mg | ORAL_TABLET | Freq: Two times a day (BID) | ORAL | Status: DC
  Administered 2013-05-20 (×2): 400 mg via ORAL
  Filled 2013-05-20 (×4): qty 2

## 2013-05-20 MED ORDER — POTASSIUM CHLORIDE 10 MEQ/50ML IV SOLN
INTRAVENOUS | Status: AC
  Administered 2013-05-21: 10 meq via INTRAVENOUS
  Filled 2013-05-20: qty 100

## 2013-05-20 NOTE — Plan of Care (Signed)
Problem: Phase III Progression Outcomes Goal: Fluid volume status improved Outcome: Progressing Continuing to diurese well on lasix drip at 12 mg /hr and zaroxolyn 5 mg po daily.  Dopamine off. Continues with Milrinone.

## 2013-05-20 NOTE — Progress Notes (Signed)
Has tolerated extubation well. Has strong cough and is in no distress. VDRF resolved. TCTS managing the rest of issues  PCCM will sign off. Please call if we can be of further assistance  Merton Border, MD ; [facility] [phone].  After 5:30 PM or weekends, call [phone]

## 2013-05-20 NOTE — Progress Notes (Signed)
K+= 3.3 and creat= 1.33 w/ urine o/p > 30cc/hr; TCTS KCL protocol initiated with 10 mEq KCL in 50cc IV x 3, each over one hour.

## 2013-05-20 NOTE — Progress Notes (Signed)
4 Days Post-Op Procedure(s) (LRB): MINIMALLY INVASIVE MAZE PROCEDURE (N/A) INTRAOPERATIVE TRANSESOPHAGEAL ECHOCARDIOGRAM (N/A) MINIMALLY INVASIVE MITRAL VALVE (MV) REPLACEMENT (Right) Subjective:  Had a stable night- extubateed and NSR Excellent diuresis- CXR still wet- cont lasix drip Stable hemodynabnics- DC swan and mobilize OOB Diarrhea- chk C diff and stop Maxepime Objective: Vital signs in last 24 hours: Temp:  [98 F (36.7 C)-99.5 F (37.5 C)] 98.6 F (37 C) (03/15 1000) Pulse Rate:  [70-120] 77 (03/15 1000) Cardiac Rhythm:  [-] Normal sinus rhythm;Heart block (03/15 0800) Resp:  [15-36] 21 (03/15 1000) BP: (72-146)/(33-95) 135/33 mmHg (03/15 1000) SpO2:  [89 %-100 %] 93 % (03/15 1000) Arterial Line BP: (93-176)/(41-61) 138/48 mmHg (03/15 1000) FiO2 (%):  [40 %] 40 % (03/14 1233) Weight:  [206 lb 2.1 oz (93.5 kg)] 206 lb 2.1 oz (93.5 kg) (03/15 0600)  Hemodynamic parameters for last 24 hours: PAP: (33-59)/(15-30) 59/29 mmHg CO:  [4.5 L/min-5.8 L/min] 5.8 L/min CI:  [2.4 L/min/m2-3.1 L/min/m2] 3.1 L/min/m2  Intake/Output from previous day: 03/14 0701 - 03/15 0700 In: 2516.6 [I.V.:1666.6; IV Piggyback:850] Out: 9628 [Urine:4735; Chest Tube:250] Intake/Output this shift: Total I/O In: 453.4 [P.O.:210; I.V.:193.4; IV Piggyback:50] Out: 721 [Urine:720; Stool:1]    Lab Results:  Recent Labs  05/19/13 1630 05/20/13 0330  WBC 11.6* 11.7*  HGB 8.7* 8.7*  HCT 24.8* 25.3*  PLT 114* 128*   BMET:  Recent Labs  05/19/13 1630 05/20/13 0330  NA 140 141  K 3.3* 3.3*  CL 100 97  CO2 24 25  GLUCOSE 125* 125*  BUN 30* 32*  CREATININE 1.38* 1.33*  CALCIUM 8.9 9.3    PT/INR:  Recent Labs  05/20/13 0330  LABPROT 14.6  INR 1.16   ABG    Component Value Date/Time   PHART 7.413 05/20/2013 0401   HCO3 26.8* 05/20/2013 0401   TCO2 28 05/20/2013 0401   ACIDBASEDEF 2.0 05/17/2013 1626   O2SAT 94.0 05/20/2013 0401   CBG (last 3)   Recent Labs  05/19/13 2347  05/20/13 0354 05/20/13 0721  GLUCAP 118* 116* 113*    Assessment/Plan: S/P Procedure(s) (LRB): MINIMALLY INVASIVE MAZE PROCEDURE (N/A) INTRAOPERATIVE TRANSESOPHAGEAL ECHOCARDIOGRAM (N/A) MINIMALLY INVASIVE MITRAL VALVE (MV) REPLACEMENT (Right) Start po coumadin 2.5 mg PT ordered   LOS: 22 days    VAN TRIGT III,PETER 05/20/2013

## 2013-05-21 ENCOUNTER — Encounter (HOSPITAL_COMMUNITY): Payer: Self-pay | Admitting: Radiology

## 2013-05-21 ENCOUNTER — Inpatient Hospital Stay (HOSPITAL_COMMUNITY): Payer: Medicare HMO

## 2013-05-21 DIAGNOSIS — I6350 Cerebral infarction due to unspecified occlusion or stenosis of unspecified cerebral artery: Secondary | ICD-10-CM

## 2013-05-21 DIAGNOSIS — I639 Cerebral infarction, unspecified: Secondary | ICD-10-CM | POA: Diagnosis present

## 2013-05-21 LAB — CBC
HCT: 25.5 % — ABNORMAL LOW (ref 36.0–46.0)
Hemoglobin: 8.6 g/dL — ABNORMAL LOW (ref 12.0–15.0)
MCH: 31.2 pg (ref 26.0–34.0)
MCHC: 33.7 g/dL (ref 30.0–36.0)
MCV: 92.4 fL (ref 78.0–100.0)
Platelets: 147 10*3/uL — ABNORMAL LOW (ref 150–400)
RBC: 2.76 MIL/uL — ABNORMAL LOW (ref 3.87–5.11)
RDW: 14.8 % (ref 11.5–15.5)
WBC: 11.4 10*3/uL — ABNORMAL HIGH (ref 4.0–10.5)

## 2013-05-21 LAB — COMPREHENSIVE METABOLIC PANEL
ALT: 30 U/L (ref 0–35)
AST: 29 U/L (ref 0–37)
Albumin: 2.6 g/dL — ABNORMAL LOW (ref 3.5–5.2)
Alkaline Phosphatase: 76 U/L (ref 39–117)
BUN: 35 mg/dL — ABNORMAL HIGH (ref 6–23)
CO2: 28 mEq/L (ref 19–32)
Calcium: 9.1 mg/dL (ref 8.4–10.5)
Chloride: 100 mEq/L (ref 96–112)
Creatinine, Ser: 1.33 mg/dL — ABNORMAL HIGH (ref 0.50–1.10)
GFR calc Af Amer: 45 mL/min — ABNORMAL LOW (ref >90)
GFR calc non Af Amer: 39 mL/min — ABNORMAL LOW (ref >90)
Glucose, Bld: 130 mg/dL — ABNORMAL HIGH (ref 70–99)
Potassium: 3.7 mEq/L (ref 3.7–5.3)
Sodium: 142 mEq/L (ref 137–147)
Total Bilirubin: 0.6 mg/dL (ref 0.3–1.2)
Total Protein: 5.7 g/dL — ABNORMAL LOW (ref 6.0–8.3)

## 2013-05-21 LAB — CULTURE, RESPIRATORY

## 2013-05-21 LAB — CULTURE, RESPIRATORY W GRAM STAIN

## 2013-05-21 LAB — GLUCOSE, CAPILLARY
GLUCOSE-CAPILLARY: 100 mg/dL — AB (ref 70–99)
Glucose-Capillary: 108 mg/dL — ABNORMAL HIGH (ref 70–99)

## 2013-05-21 MED ORDER — FUROSEMIDE 10 MG/ML IJ SOLN
12.0000 mg/h | INTRAMUSCULAR | Status: AC
  Filled 2013-05-21: qty 25

## 2013-05-21 MED ORDER — ACETAMINOPHEN 500 MG PO TABS
500.0000 mg | ORAL_TABLET | Freq: Three times a day (TID) | ORAL | Status: DC | PRN
  Administered 2013-05-23 – 2013-05-30 (×8): 500 mg via ORAL
  Filled 2013-05-21 (×8): qty 1

## 2013-05-21 MED ORDER — GLUCERNA SHAKE PO LIQD
237.0000 mL | Freq: Two times a day (BID) | ORAL | Status: DC
  Administered 2013-05-26 – 2013-05-30 (×7): 237 mL via ORAL

## 2013-05-21 MED ORDER — ASPIRIN EC 81 MG PO TBEC
81.0000 mg | DELAYED_RELEASE_TABLET | Freq: Every day | ORAL | Status: DC
  Filled 2013-05-21: qty 1

## 2013-05-21 MED ORDER — METOPROLOL TARTRATE 25 MG PO TABS
25.0000 mg | ORAL_TABLET | Freq: Two times a day (BID) | ORAL | Status: DC
  Administered 2013-05-21 – 2013-05-23 (×6): 25 mg via ORAL
  Filled 2013-05-21 (×8): qty 1

## 2013-05-21 MED ORDER — FUROSEMIDE 40 MG PO TABS
60.0000 mg | ORAL_TABLET | Freq: Two times a day (BID) | ORAL | Status: DC
  Filled 2013-05-21 (×3): qty 1

## 2013-05-21 MED ORDER — SODIUM CHLORIDE 0.9 % IV SOLN
250.0000 mL | INTRAVENOUS | Status: DC | PRN
  Administered 2013-05-21: 250 mL via INTRAVENOUS

## 2013-05-21 MED ORDER — POTASSIUM CHLORIDE 10 MEQ/50ML IV SOLN
10.0000 meq | INTRAVENOUS | Status: AC
  Administered 2013-05-21 (×3): 10 meq via INTRAVENOUS
  Filled 2013-05-21 (×3): qty 50

## 2013-05-21 MED ORDER — POTASSIUM CHLORIDE CRYS ER 20 MEQ PO TBCR: 20.0000 meq | EXTENDED_RELEASE_TABLET | Freq: Two times a day (BID) | ORAL | Status: DC

## 2013-05-21 MED ORDER — LISINOPRIL 5 MG PO TABS
5.0000 mg | ORAL_TABLET | Freq: Every day | ORAL | Status: DC
  Administered 2013-05-21 – 2013-05-22 (×2): 5 mg via ORAL
  Filled 2013-05-21 (×2): qty 1

## 2013-05-21 MED ORDER — SODIUM CHLORIDE 0.9 % IJ SOLN
3.0000 mL | Freq: Two times a day (BID) | INTRAMUSCULAR | Status: DC
  Administered 2013-05-21 – 2013-05-29 (×14): 3 mL via INTRAVENOUS

## 2013-05-21 MED ORDER — GUAIFENESIN ER 600 MG PO TB12
600.0000 mg | ORAL_TABLET | Freq: Two times a day (BID) | ORAL | Status: DC | PRN
  Filled 2013-05-21 (×2): qty 1

## 2013-05-21 MED ORDER — AMIODARONE HCL 200 MG PO TABS
200.0000 mg | ORAL_TABLET | Freq: Two times a day (BID) | ORAL | Status: DC
  Administered 2013-05-21 – 2013-05-23 (×6): 200 mg via ORAL
  Filled 2013-05-21 (×8): qty 1

## 2013-05-21 MED ORDER — POTASSIUM CHLORIDE CRYS ER 20 MEQ PO TBCR
20.0000 meq | EXTENDED_RELEASE_TABLET | Freq: Two times a day (BID) | ORAL | Status: DC
  Filled 2013-05-21 (×2): qty 1

## 2013-05-21 MED ORDER — POTASSIUM CHLORIDE 10 MEQ/50ML IV SOLN: 10.0000 meq | INTRAVENOUS | Status: AC

## 2013-05-21 MED ORDER — LEVALBUTEROL HCL 0.63 MG/3ML IN NEBU: 0.6300 mg | INHALATION_SOLUTION | Freq: Four times a day (QID) | RESPIRATORY_TRACT | Status: DC | PRN

## 2013-05-21 MED ORDER — ACETAMINOPHEN 325 MG PO TABS
650.0000 mg | ORAL_TABLET | Freq: Four times a day (QID) | ORAL | Status: DC | PRN
Start: 2013-05-21 — End: 2013-05-21

## 2013-05-21 MED ORDER — TRAMADOL HCL 50 MG PO TABS
50.0000 mg | ORAL_TABLET | ORAL | Status: DC | PRN
  Administered 2013-05-23 (×2): 100 mg via ORAL
  Filled 2013-05-21 (×3): qty 2

## 2013-05-21 MED ORDER — FUROSEMIDE 40 MG PO TABS: 40.0000 mg | ORAL_TABLET | Freq: Two times a day (BID) | ORAL | Status: DC

## 2013-05-21 MED ORDER — DARIFENACIN HYDROBROMIDE ER 7.5 MG PO TB24
7.5000 mg | ORAL_TABLET | Freq: Every day | ORAL | Status: DC
  Administered 2013-05-21 – 2013-05-30 (×10): 7.5 mg via ORAL
  Filled 2013-05-21 (×10): qty 1

## 2013-05-21 MED ORDER — WARFARIN SODIUM 2.5 MG PO TABS
2.5000 mg | ORAL_TABLET | Freq: Every day | ORAL | Status: DC
  Administered 2013-05-21 – 2013-05-26 (×6): 2.5 mg via ORAL
  Filled 2013-05-21 (×7): qty 1

## 2013-05-21 MED ORDER — MOVING RIGHT ALONG BOOK
Freq: Once | Status: AC
  Administered 2013-05-21: 08:00:00
  Filled 2013-05-21: qty 1

## 2013-05-21 MED ORDER — ASPIRIN EC 325 MG PO TBEC
325.0000 mg | DELAYED_RELEASE_TABLET | Freq: Every day | ORAL | Status: DC
  Administered 2013-05-21: 325 mg via ORAL
  Filled 2013-05-21 (×2): qty 1

## 2013-05-21 MED ORDER — SODIUM CHLORIDE 0.9 % IJ SOLN: 3.0000 mL | INTRAMUSCULAR | Status: DC | PRN

## 2013-05-21 NOTE — Consult Note (Addendum)
Referring Physician: Roxy Manns    Chief Complaint: transient expressive aphasia.  HPI:                                                                                                                                         [name] [name] is an 73 y.o. female who underwent a minimally invasive mitral valve replacement  And Maze procedure on 05/16/13.  Patient had been doing well and recovering well but this AM it was noted he had new grabbled speech and word finding difficulty.  Nurse at bedside states this AM she was having difficulty getting her words out but had no problems writing.    Patient states March in 2000 she had a stroke but this affected her gait and she had no problems with her speech.  Currently she is in Afib and on ASA and coumadin with last INR of 1.16.  Upon consultation she has a hoarse voice but has no problems expressing herself or following commands.  She is able to tell me in depth the symptoms and events of her last stroke in 2000, able to name objects, repeat words, follow commands. Neurology was consulted for possible CVA.    Head CT today showed No identifiable acute stroke. 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 Chronic small vessel change of the hemispheric white matter.   Of note on 05/20/13 she had one BP 83/57 but since then her BP has been 150's/50's  Date last known well: Date: 05/20/2013 Time last known well: Unable to determine tPA Given: No: exact LKW is unknown.   Past Medical History  Diagnosis Date  . Chronic airway obstruction, not elsewhere classified   . Precordial pain   . Swelling of limb   . Dizziness and giddiness   . Unspecified transient cerebral ischemia     Diag. 2003  . Acute, but ill-defined, cerebrovascular disease     2000 [place]  . Shortness of breath   . Obesity   . Juvenile rheumatic fever     age 84  . Unspecified essential hypertension   . Hyperlipidemia   . Mitral regurgitation 02/16/2013   . Chronic kidney disease   . CHF (congestive heart failure) 04/23/2013  . Paroxysmal atrial fibrillation 02/16/2013    Recurrent paroxysmal atrial fibrillation, first diagnosed December 2014  . Chronic diastolic congestive heart failure   . Arthritis     Chronic left knee pain  . Aortic insufficiency due to bicuspid aortic valve     moderate by TEE  . 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 S/P Maze operation for atrial fibrillation 05/16/2013    Complete bilateral atrial lesion set using cryothermy and bipolar radiofrequency ablation with oversewing of LA appendage  Past Surgical History  Procedure Laterality Date  . Total knee arthroplasty Right   . Abdominal hysterectomy      Cervical Cancer  . Parathyroid/thyroid surgery      tumor  . Tee without cardioversion N/A 04/06/2013    Procedure: TRANSESOPHAGEAL ECHOCARDIOGRAM (TEE);  Surgeon: [name], MD;  [facility];  Service: Cardiology;  Laterality: N/A;  . Minimally invasive maze procedure N/A 05/16/2013    Procedure: MINIMALLY INVASIVE MAZE PROCEDURE;  Surgeon: Rexene Alberts, MD;  Location: [place];  Service: Open Heart Surgery;  Laterality: N/A;  . Intraoperative transesophageal echocardiogram N/A 05/16/2013    Procedure: INTRAOPERATIVE TRANSESOPHAGEAL ECHOCARDIOGRAM;  Surgeon: Rexene Alberts, MD;  Location: [place];  Service: Open Heart Surgery;  Laterality: N/A;  . Mitral valve replacement Right 05/16/2013    Procedure: MINIMALLY INVASIVE MITRAL VALVE (MV) REPLACEMENT;  Surgeon: Rexene Alberts, MD;  Location: [place];  Service: Open Heart Surgery;  Laterality: Right;    Family History  Problem Relation Age of Onset  . Heart failure Father   . Heart attack Brother    Social History:  reports that she has never smoked. She has never used smokeless  tobacco. She reports that she does not drink alcohol or use illicit drugs.  Allergies:  Allergies  Allergen Reactions  . Indomethacin Other (See Comments)    dizziness  . Norvasc [Amlodipine Besylate] Cough    Medications:                                                                                                                           Scheduled: . amiodarone  200 mg Oral BID  . aspirin EC  325 mg Oral Daily  . bisacodyl  10 mg Rectal Daily  . darifenacin  7.5 mg Oral Daily  . enoxaparin (LOVENOX) injection  30 mg Subcutaneous Q24H  . feeding supplement (GLUCERNA SHAKE)  237 mL Oral BID BM  . [START ON 05/22/2013] furosemide  60 mg Oral BID  . lisinopril  5 mg Oral Daily  . metoprolol tartrate  25 mg Oral BID  . moving right along book   Does not apply Once  . [START ON 05/22/2013] potassium chloride SA  20 mEq Oral BID  . sodium chloride  10-40 mL Intracatheter Q12H  . sodium chloride  3 mL Intravenous Q12H  . warfarin  2.5 mg Oral q1800  . Warfarin - Physician Dosing Inpatient   Does not apply q1800   Continuous: . furosemide (LASIX) infusion 12 mg/hr (05/21/13 0815)   [id number] chloride, acetaminophen, fentaNYL, guaiFENesin, levalbuterol, ondansetron (ZOFRAN) IV, sodium chloride, sodium chloride, traMADol  ROS:  History obtained from the patient  General ROS: negative for - chills, fatigue, fever, night sweats, weight gain or weight loss Psychological ROS: negative for - behavioral disorder, hallucinations, memory difficulties, mood swings or suicidal ideation Ophthalmic ROS: negative for - blurry vision, double vision, eye pain or loss of vision ENT ROS: negative for - epistaxis, nasal discharge, oral lesions, sore throat, tinnitus or vertigo Allergy and Immunology ROS: negative for - hives or itchy/watery eyes Hematological and  Lymphatic ROS: negative for - bleeding problems, bruising or swollen lymph nodes Endocrine ROS: negative for - galactorrhea, hair pattern changes, polydipsia/polyuria or temperature intolerance Respiratory ROS: negative for - cough, hemoptysis, shortness of breath or wheezing Cardiovascular ROS: negative for - chest pain, dyspnea on exertion, edema or irregular heartbeat Gastrointestinal ROS: negative for - abdominal pain, diarrhea, hematemesis, nausea/vomiting or stool incontinence Genito-Urinary ROS: negative for - dysuria, hematuria, incontinence or urinary frequency/urgency Musculoskeletal ROS: negative for - joint swelling or muscular weakness Neurological ROS: as noted in HPI Dermatological ROS: negative for rash and skin lesion changes  Neurologic Examination:                                                                                                      Blood pressure 154/52, pulse 59, temperature 98 F (36.7 C), temperature source Oral, resp. rate 21, height 5\' 2"  (1.575 m), weight 93.5 kg (206 lb 2.1 oz), SpO2 95.00%.   Mental Status: Alert, oriented, thought content appropriate.  Speech hoarse but fluent without evidence of aphasia.  Able to follow 3 step commands without difficulty. Cranial Nerves: II: Discs flat bilaterally; Visual fields grossly normal, pupils equal, round, reactive to light and accommodation III,IV, VI: ptosis present in the left eye, extra-ocular motions intact bilaterally V,VII: decrease in right NLF, facial light touch sensation normal bilaterally VIII: hearing normal bilaterally IX,X: gag reflex present XI: bilateral shoulder shrug XII: midline tongue extension without atrophy or fasciculations  Motor: Right : Upper extremity   5/5    Left:     Upper extremity   5/5  Lower extremity   4/5     Lower extremity   4/5 Tone and bulk:normal tone throughout; no atrophy noted Sensory: Pinprick and light touch intact throughout, bilaterally with bilateral  LE decreased cold and vibration.  Deep Tendon Reflexes:  Right: Upper Extremity   Left: Upper extremity   biceps (C-5 to C-6) 2/4   biceps (C-5 to C-6) 2/4 tricep (C7) 2/4    triceps (C7) 2/4 Brachioradialis (C6) 2/4  Brachioradialis (C6) 2/4  Lower Extremity Lower Extremity  quadriceps (L-2 to L-4) 1/4   quadriceps (L-2 to L-4) 1/4 Achilles (S1) 0/4   Achilles (S1) 0/4  Plantars: Right: downgoing   Left: downgoing Cerebellar: normal finger-to-nose,  heel-to-shin test difficult due to limited ROM but no dysmetria.  Gait: not tested CV: pulses palpable throughout    Lab Results: Basic Metabolic Panel:  Recent Labs Lab 05/16/13 1927 05/17/13 0330 05/17/13 1700  05/18/13 1559 05/19/13 0350 05/19/13 1630 05/20/13 0330 05/20/13 1550 05/21/13 0346  NA 138 143  --   < >  141 142 140 141 138 142  K 4.8 4.2  --   < > 3.5* 3.2* 3.3* 3.3* 3.1* 3.7  CL  --  103  --   < > 104 102 100 97 97 100  CO2  --  25  --   < > 24 24 24 25 26 28   GLUCOSE 173* 114*  --   < > 175* 143* 125* 125* 200* 130*  BUN  --  21  --   < > 26* 26* 30* 32* 34* 35*  CREATININE  --  1.07 1.27*  < > 1.33* 1.35* 1.38* 1.33* 1.33* 1.33*  CALCIUM  --  8.0*  --   < > 8.7 9.2 8.9 9.3 8.7 9.1  MG  --  3.1* 2.8*  --   --  2.1  --   --   --   --   PHOS  --   --   --   --   --  4.1  --   --   --   --   < > = values in this interval not displayed.  Liver Function Tests:  Recent Labs Lab 05/21/13 0346  AST 29  ALT 30  ALKPHOS 76  BILITOT 0.6  PROT 5.7*  ALBUMIN 2.6*   No results found for this basename: LIPASE, AMYLASE,  in the last 168 hours No results found for this basename: AMMONIA,  in the last 168 hours  CBC:  Recent Labs Lab 05/18/13 0355 05/19/13 0350 05/19/13 1630 05/20/13 0330 05/21/13 0346  WBC 10.3 11.2* 11.6* 11.7* 11.4*  HGB 8.3* 8.7* 8.7* 8.7* 8.6*  HCT 23.7* 24.7* 24.8* 25.3* 25.5*  MCV 88.8 89.8 91.2 92.0 92.4  PLT 122* 126* 114* 128* 147*    Cardiac Enzymes: No results found  for this basename: CKTOTAL, CKMB, CKMBINDEX, TROPONINI,  in the last 168 hours  Lipid Panel: No results found for this basename: CHOL, TRIG, HDL, CHOLHDL, VLDL, LDLCALC,  in the last 168 hours  CBG:  Recent Labs Lab 05/20/13 1235 05/20/13 1701 05/20/13 1925 05/20/13 2129 05/21/13 0915  GLUCAP 119* 133* 106* 100* 108*    Microbiology: Results for orders placed during the hospital encounter of 04/28/13  MRSA PCR SCREENING     Status: None   Collection Time    05/01/13 11:45 AM      Result Value Ref Range Status   MRSA by PCR NEGATIVE  NEGATIVE Final   Comment:            The GeneXpert MRSA Assay (FDA     approved for NASAL specimens     only), is one component of a     comprehensive MRSA colonization     surveillance program. It is not     intended to diagnose MRSA     infection nor to guide or     monitor treatment for     MRSA infections.  CLOSTRIDIUM DIFFICILE BY PCR     Status: None   Collection Time    05/13/13 10:24 PM      Result Value Ref Range Status   C difficile by pcr NEGATIVE  NEGATIVE Final  SURGICAL PCR SCREEN     Status: None   Collection Time    05/15/13  6:10 AM      Result Value Ref Range Status   MRSA, PCR NEGATIVE  NEGATIVE Final   Staphylococcus aureus NEGATIVE  NEGATIVE Final   Comment:  The Xpert SA Assay (FDA     approved for NASAL specimens     in patients over 61 years of age),     is one component of     a comprehensive surveillance     program.  Test performance has     been validated by  [employer] for patients greater     than or equal to 21 year old.     It is not intended     to diagnose infection nor to     guide or monitor treatment.  URINE CULTURE     Status: None   Collection Time    05/18/13  2:08 PM      Result Value Ref Range Status   Specimen Description URINE, CATHETERIZED   Final   Special Requests NONE   Final   Culture  Setup Time     Final   Value: 05/18/2013 21:10     Performed at [facility]     Final   Value: NO GROWTH     Performed at [facility]   Culture     Final   Value: NO GROWTH     Performed at [facility]   Report Status 05/19/2013 FINAL   Final  CULTURE, BLOOD (ROUTINE X 2)     Status: None   Collection Time    05/18/13  2:32 PM      Result Value Ref Range Status   Specimen Description BLOOD RIGHT ARM   Final   Special Requests BOTTLES DRAWN AEROBIC AND ANAEROBIC 10CC   Final   Culture  Setup Time     Final   Value: 05/18/2013 20:44     Performed at [facility]   Culture     Final   Value:        BLOOD CULTURE RECEIVED NO GROWTH TO DATE CULTURE WILL BE HELD FOR 5 DAYS BEFORE ISSUING A FINAL NEGATIVE REPORT     Performed at [facility]   Report Status PENDING   Incomplete  CULTURE, BLOOD (ROUTINE X 2)     Status: None   Collection Time    05/18/13  2:43 PM      Result Value Ref Range Status   Specimen Description BLOOD RIGHT HAND   Final   Special Requests BOTTLES DRAWN AEROBIC ONLY 10CC   Final   Culture  Setup Time     Final   Value: 05/18/2013 20:43     Performed at [facility]   Culture     Final   Value:        BLOOD CULTURE RECEIVED NO GROWTH TO DATE CULTURE WILL BE HELD FOR 5 DAYS BEFORE ISSUING A FINAL NEGATIVE REPORT     Performed at [facility]   Report Status PENDING   Incomplete  CULTURE, RESPIRATORY (NON-EXPECTORATED)     Status: None   Collection Time    05/18/13  7:35 PM      Result Value Ref Range Status   Specimen Description TRACHEAL ASPIRATE   Final   Special Requests NONE   Final   Gram Stain     Final   Value: MODERATE WBC PRESENT,BOTH PMN AND MONONUCLEAR     NO SQUAMOUS EPITHELIAL CELLS SEEN     NO ORGANISMS SEEN     Performed at [facility]   Culture     Final   Value:  Non-Pathogenic Oropharyngeal-type Flora Isolated.     Performed at [facility]   Report Status 05/21/2013 FINAL   Final  CLOSTRIDIUM DIFFICILE BY PCR     Status:  None   Collection Time    05/20/13 10:51 AM      Result Value Ref Range Status   C difficile by pcr NEGATIVE  NEGATIVE Final    Coagulation Studies:  Recent Labs  05/20/13 0330  LABPROT 14.6  INR 1.16    Imaging: Ct Head Wo Contrast  05/21/2013   CLINICAL DATA:  Recent CABG.  Speech disturbance.  Possible stroke.  EXAM: CT HEAD WITHOUT CONTRAST  TECHNIQUE: Contiguous axial images were obtained from the base of the skull through the vertex without intravenous contrast.  COMPARISON:  None.  FINDINGS: The brain shows mild generalized atrophy. There is old infarction in the right basal ganglia and radiating white matter tracts with atrophy. There chronic small vessel changes within the hemispheric deep white matter. There is no identifiable E acute infarction, mass lesion, hemorrhage, hydrocephalus or extra-axial collection. Visualized sinuses are clear. No calvarial abnormality.  IMPRESSION: No identifiable acute stroke by CT.  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 Chronic small vessel change of the hemispheric white matter.   Electronically Signed   By: Nelson Chimes M.D.   On: 05/21/2013 09:02   Dg Chest Port 1 View  05/21/2013   CLINICAL DATA:  Mitral valve replacement.  EXAM: PORTABLE CHEST - 1 VIEW  COMPARISON:  DG CHEST 1V PORT dated 05/20/2013; CT ANGIO CHEST AORTA W/CM & WO/CM dated 05/07/2013; DG CHEST 1V PORT dated 05/19/2013; DG CHEST 1V PORT dated 05/17/2013; DG CHEST 1V PORT dated 05/18/2013  FINDINGS: Swan-Ganz catheter has been removed. A left IJ sheath and central line are in unchanged position. Right basilar chest tubes in unchanged position.  Unchanged marked cardiomegaly. Status post mitral valve replacement. There is unchanged hazy density in the lower lungs, likely combination pleural fluid and atelectasis. No evidence of pneumothorax.  IMPRESSION: 1. Remaining tubes and lines in good position. 2. Unchanged aeration over multiple days. There is  venous congestion, pleural effusions, and bibasilar lung opacity.   Electronically Signed   By: Jorje Guild M.D.   On: 05/21/2013 07:25   Dg Chest Port 1 View  05/20/2013   CLINICAL DATA:  Previous cardiac surgery  EXAM: PORTABLE CHEST - 1 VIEW  COMPARISON:  05/19/2013  FINDINGS: Cardiac shadow is stable. A Swan-Ganz catheter is again noted in the pulmonary outflow tract. The second left jugular line is noted. The endotracheal tube and nasogastric catheter been removed in the interval. Thoracostomy catheters remain on the right. Persistent right basilar density is seen. No pneumothorax is noted. The previously seen right rib fractures are less well visualized on the current study.  IMPRESSION: Stable changes in the right lung base.   Electronically Signed   By: Inez Catalina M.D.   On: 05/20/2013 07:25    Etta Quill PA-C [facility] [phone]  05/21/2013, 11:26 AM   Patient seen and examined.  Clinical course and management discussed.  Necessary edits performed.  I agree with the above.  Assessment and plan of care developed and discussed below.    Assessment: 73 y.o. female s/p mitral valve replacement who awakened today with slurred speech.  There is question of stroke.  Patient is quite fluent at this time and for the majority of a long conversation only exhibits hoarseness.  After speaking about  5 minutes she seems to fatigue and exhibit slurred speech.  Head CT reviewed and shows no acute changes.  Pre-operatively patient had a carotid doppler that showed no hemodynamically significant stenosis.  Intraoperatively the patient had a TEE that showed no intracardiac masses or thrombi.  Currently patient is on ASA. Lovenox and Coumadin With minimal symptoms suspect a small vessel event and not a large vessel infarct.  With recent surgery patient not a tPA candidate.    Stroke Risk Factors - atrial fibrillation and hyperlipidemia  Recommendations: 1.  Frequent neuro checks 2.   MRI of the brain when medically reasonable.   3.  With suspected size felt to be small do not feel that anticoagulation treatment plans need to be adjusted.   4.  Speech therapy  Alexis Goodell, MD [facility] [phone]  05/21/2013  3:37 PM

## 2013-05-21 NOTE — Progress Notes (Signed)
[facility] BRIEF SICU PROGRESS NOTE  5 Days Post-Op  S/P Procedure(s) (LRB): MINIMALLY INVASIVE MAZE PROCEDURE (N/A) INTRAOPERATIVE TRANSESOPHAGEAL ECHOCARDIOGRAM (N/A) MINIMALLY INVASIVE MITRAL VALVE (MV) REPLACEMENT (Right)   Stable day Brain CT scan negative for acute stroke Intermittent difficulty w/ speech persists, seems perhaps to be more a function of weakness of muscles and/or trauma from ET tube No difficulty swallowing liquids and soft food  Plan: Will ask SLP team to evaluate tomorrow  Rexene Alberts 05/21/2013 6:34 PM

## 2013-05-21 NOTE — Progress Notes (Signed)
Dr Roxy Manns contacted r/t noted neurological changes on Pt at shift change; Pt demonstrated new onset of verbal expressive dysphagia, unable to complete single word answers or questions about location and birth date. Further neuro checks revealed Rt pupil 5 mm and reactive and Lt pupil at 3 mm and reactive, motor strength equal in upper and lower extremities, no drift or facial droop noted. Pt again questioned about her A&O with inability to answer complete sentences. Dr Roxy Manns coming to bedside to see Pt.

## 2013-05-21 NOTE — Progress Notes (Signed)
NUTRITION FOLLOW UP  INTERVENTION: Glucerna Shake po BID, each supplement provides 220 kcal and 10 grams of protein RD to follow for nutrition care plan  NUTRITION DIAGNOSIS: Inadequate oral intake now related to limited appetite as evidenced by PO intake 20%, ongoing  New Goal: Pt to meet >/= 90% of their estimated nutrition needs, progressing  Monitor:  PO & supplemental intake, weight, labs, I/O's  ASSESSMENT: Patient admitted with shortness of breath; s/p cardiac cath 2/27; + severe mitral regurgitation; decision made to proceed with surgical intervention.  Patient s/p procedures 3/12: MINIMALLY INVASIVE MAZE PROCEDURE  INTRAOPERATIVE TRANSESOPHAGEAL ECHOCARDIOGRAM  MINIMALLY INVASIVE MITRAL VALVE REPLACEMENT   Patient transferred from 3E-CHF to 2S-Surgical ICU post-op.  Patient discussed in ICU rounds.  Patient extubated 3/14.  TF via OGT discontinued.  Advanced to Full Liquid diet 3/15.  Patient eating breakfast upon RD visit.  PO intake 20% per flowsheet records.  About to work with PT.  Pt with some aphasia this AM.  Per RN, no difficulty swallowing at this time.  Height: Ht Readings from Last 1 Encounters:  05/16/13 5\' 2"  (1.575 m)    Weight: Wt Readings from Last 1 Encounters:  05/20/13 206 lb 2.1 oz (93.5 kg)    03/14  219 lb 03/13  218 lb 03/12  219 lb 03/11  184 lb 03/10  185 lb 03/09  186 lb 03/08  187 lb 03/07  185 lb 03/06  188 lb  BMI:  Body mass index is 37.69 kg/(m^2).  Re-estimated needs: Kcal: 1500-1700 Protein: 100-110 gm Fluid: per MD  Skin: Stage I pressure ulcer to buttocks, surgical incisions   Diet Order: Full Liquids   Intake/Output Summary (Last 24 hours) at 05/21/13 1053 Last data filed at 05/21/13 0913  Gross per 24 hour  Intake 2081.79 ml  Output   2996 ml  Net -914.21 ml    Labs:   Recent Labs Lab 05/17/13 0330 05/17/13 1700  05/18/13 1559 05/19/13 0350  05/20/13 0330 05/20/13 1550 05/21/13 0346  NA 143   --   < > 141 142  < > 141 138 142  K 4.2  --   < > 3.5* 3.2*  < > 3.3* 3.1* 3.7  CL 103  --   < > 104 102  < > 97 97 100  CO2 25  --   < > 24 24  < > 25 26 28   BUN 21  --   < > 26* 26*  < > 32* 34* 35*  CREATININE 1.07 1.27*  < > 1.33* 1.35*  < > 1.33* 1.33* 1.33*  CALCIUM 8.0*  --   < > 8.7 9.2  < > 9.3 8.7 9.1  MG 3.1* 2.8*  --   --  2.1  --   --   --   --   PHOS  --   --   --   --  4.1  --   --   --   --   GLUCOSE 114*  --   < > 175* 143*  < > 125* 200* 130*  < > = values in this interval not displayed.  CBG (last 3)   Recent Labs  05/20/13 1925 05/20/13 2129 05/21/13 0915  GLUCAP 106* 100* 108*    Scheduled Meds: . amiodarone  200 mg Oral BID  . aspirin EC  325 mg Oral Daily  . bisacodyl  10 mg Rectal Daily  . darifenacin  7.5 mg Oral Daily  . enoxaparin (  LOVENOX) injection  30 mg Subcutaneous Q24H  . [START ON 05/22/2013] furosemide  60 mg Oral BID  . lisinopril  5 mg Oral Daily  . metoprolol tartrate  25 mg Oral BID  . moving right along book   Does not apply Once  . [START ON 05/22/2013] potassium chloride SA  20 mEq Oral BID  . sodium chloride  10-40 mL Intracatheter Q12H  . sodium chloride  3 mL Intravenous Q12H  . warfarin  2.5 mg Oral q1800  . Warfarin - Physician Dosing Inpatient   Does not apply q1800    Continuous Infusions: . furosemide (LASIX) infusion 12 mg/hr (05/21/13 0815)    Past Medical History  Diagnosis Date  . Chronic airway obstruction, not elsewhere classified   . Precordial pain   . Swelling of limb   . Dizziness and giddiness   . Unspecified transient cerebral ischemia     Diag. 2003  . Acute, but ill-defined, cerebrovascular disease     2000 [place]  . Shortness of breath   . Obesity   . Juvenile rheumatic fever     age 69  . Unspecified essential hypertension   . Hyperlipidemia   . Mitral regurgitation 02/16/2013  . Chronic kidney disease   . CHF (congestive heart failure) 04/23/2013  . Paroxysmal atrial fibrillation 02/16/2013     Recurrent paroxysmal atrial fibrillation, first diagnosed December 2014  . Chronic diastolic congestive heart failure   . Arthritis     Chronic left knee pain  . Aortic insufficiency due to bicuspid aortic valve     moderate by TEE  . 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 S/P Maze operation for atrial fibrillation 05/16/2013    Complete bilateral atrial lesion set using cryothermy and bipolar radiofrequency ablation with oversewing of LA appendage    Past Surgical History  Procedure Laterality Date  . Total knee arthroplasty Right   . Abdominal hysterectomy      Cervical Cancer  . Parathyroid/thyroid surgery      tumor  . Tee without cardioversion N/A 04/06/2013    Procedure: TRANSESOPHAGEAL ECHOCARDIOGRAM (TEE);  Surgeon: [name], MD;  [facility];  Service: Cardiology;  Laterality: N/A;  . Minimally invasive maze procedure N/A 05/16/2013    Procedure: MINIMALLY INVASIVE MAZE PROCEDURE;  Surgeon: Rexene Alberts, MD;  Location: [place];  Service: Open Heart Surgery;  Laterality: N/A;  . Intraoperative transesophageal echocardiogram N/A 05/16/2013    Procedure: INTRAOPERATIVE TRANSESOPHAGEAL ECHOCARDIOGRAM;  Surgeon: Rexene Alberts, MD;  Location: [place];  Service: Open Heart Surgery;  Laterality: N/A;  . Mitral valve replacement Right 05/16/2013    Procedure: MINIMALLY INVASIVE MITRAL VALVE (MV) REPLACEMENT;  Surgeon: Rexene Alberts, MD;  Location: [place];  Service: Open Heart Surgery;  Laterality: Right;    Arthur Holms, RD, LDN Pager #: [phone] After-Hours Pager #: [phone]

## 2013-05-21 NOTE — Progress Notes (Addendum)
Physical Therapy Treatment Patient Details Name: [name] [name] MRN: [id number] DOB: February 13, 1941 Today's Date: 05/21/2013 Time: 8295-6213 PT Time Calculation (min): 33 min  PT Assessment / Plan / Recommendation  History of Present Illness Patient is a 73 year old obese widowed white female from [place] with remote history of rheumatic fever during childhood and long-standing history of chronic diastolic congestive heart failure who has recently experienced multiple recurrent episodes of acute exacerbation of chronic diastolic congestive heart failure.  She has been diagnosed with severe, symptomatic mitral regurgitation with recurrent paroxysmal atrial fibrillation S/P minimally invasive MVR   PT Comments   Pt's function moderatelydegraded post surgery and 2 days on vent, however expect pt to make steady recovery.  Pt hopes to go home with her grand daughter (not to her home), but will consider [facility] if she is not ready at d/c.   Follow Up Recommendations  Home health PT;Other (comment) (but may end up needing STSNF )     Does the patient have the potential to tolerate intense rehabilitation     Barriers to Discharge        Equipment Recommendations  Rolling walker with 5" wheels    Recommendations for Other Services    Frequency Min 3X/week   Progress towards PT Goals Progress towards PT goals:  (Goals updated post acute)  Plan Current plan remains appropriate    Precautions / Restrictions Precautions Precautions: Fall   Pertinent Vitals/Pain VSS    Mobility  Bed Mobility Overal bed mobility: Needs Assistance Bed Mobility: Supine to Sit Supine to sit: Mod assist General bed mobility comments: cuing for safety, hand placement Transfers Overall transfer level: Needs assistance Transfers: Sit to/from Stand Sit to Stand: Mod assist General transfer comment: cuing for safety, hand placement Ambulation/Gait Ambulation/Gait assistance: Mod assist;+2 physical  assistance Ambulation Distance (Feet): 30 Feet Assistive device:  (pushing w/c) Gait Pattern/deviations: Step-through pattern;Decreased step length - right;Decreased step length - left;Trunk flexed Gait velocity: decreased General Gait Details: weak gait,  quick to fatigue,  mildly staggering antalgic gait with flexed posture.    Exercises     PT Diagnosis:    PT Problem List:   PT Treatment Interventions:     PT Goals (current goals can now be found in the care plan section) Acute Rehab PT Goals Patient Stated Goal: Get home to my grand daughters home PT Goal Formulation: With patient Time For Goal Achievement: 05/22/13 Potential to Achieve Goals: Good  Visit Information  Last PT Received On: 05/21/13 Assistance Needed: +2 History of Present Illness: Patient is a 73 year old obese widowed white female from [place] with remote history of rheumatic fever during childhood and long-standing history of chronic diastolic congestive heart failure who has recently experienced multiple recurrent episodes of acute exacerbation of chronic diastolic congestive heart failure.  She has been diagnosed with severe, symptomatic mitral regurgitation with recurrent paroxysmal atrial fibrillation S/P minimally invasive MVR    Subjective Data  Subjective: I'm going to do good... Patient Stated Goal: Get home to my grand daughters home   Cognition  Cognition Arousal/Alertness: Awake/alert Behavior During Therapy: WFL for tasks assessed/performed Overall Cognitive Status: Within Functional Limits for tasks assessed    Balance  Balance Overall balance assessment: Needs assistance Sitting-balance support: Bilateral upper extremity supported;Feet supported Sitting balance-[name] Scale: Fair Standing balance support: Bilateral upper extremity supported Standing balance-[name] Scale: Poor General Comments General comments (skin integrity, edema, etc.): EHR 80's, sats on 1L [place] 90's  End of Session PT -  End of  Session Equipment Utilized During Treatment: Gait belt;Oxygen Activity Tolerance: Patient tolerated treatment well;Patient limited by fatigue Patient left: in chair;with call bell/phone within reach Nurse Communication: Mobility status   GP     Axelle Szwed, Tessie Fass 05/21/2013, 11:48 AM 05/21/2013  Donnella Sham, [place] [phone]  (pager)

## 2013-05-21 NOTE — Progress Notes (Signed)
[address]       [place] [address]             [phone]        CARDIOTHORACIC SURGERY PROGRESS NOTE   R5 Days Post-Op Procedure(s) (LRB): MINIMALLY INVASIVE MAZE PROCEDURE (N/A) INTRAOPERATIVE TRANSESOPHAGEAL ECHOCARDIOGRAM (N/A) MINIMALLY INVASIVE MITRAL VALVE (MV) REPLACEMENT (Right)  Subjective: Awake and alert and states that she feels well, but early this morning patient has started to have some garbled speech and difficulty finding words - reportedly this was not present yesterday.  She follows commands appropriately and displays no other significant neurologic findings.  Objective: Vital signs: BP Readings from Last 1 Encounters:  05/21/13 154/53   Pulse Readings from Last 1 Encounters:  05/21/13 85   Resp Readings from Last 1 Encounters:  05/21/13 28   Temp Readings from Last 1 Encounters:  05/21/13 98.4 F (36.9 C) Oral    Hemodynamics: PAP: (52-60)/(19-29) 55/22 mmHg  Physical Exam:  Rhythm:   sinus  Breath sounds: Diminished at bases   Heart sounds:  RRR  Incisions:  Dressing dry  Abdomen:  soft  Extremities:  warm   Intake/Output from previous day: 03/15 0701 - 03/16 0700 In: 2499 [P.O.:540; I.V.:1259; IV Piggyback:700] Out: 3388 [Urine:3085; Stool:103; Chest Tube:200] Intake/Output this shift: Total I/O In: 50 [IV Piggyback:50] Out: -   Lab Results:  CBC: Recent Labs  05/20/13 0330 05/21/13 0346  WBC 11.7* 11.4*  HGB 8.7* 8.6*  HCT 25.3* 25.5*  PLT 128* 147*    BMET:  Recent Labs  05/20/13 1550 05/21/13 0346  NA 138 142  K 3.1* 3.7  CL 97 100  CO2 26 28  GLUCOSE 200* 130*  BUN 34* 35*  CREATININE 1.33* 1.33*  CALCIUM 8.7 9.1     CBG (last 3)   Recent Labs  05/20/13 1701 05/20/13 1925 05/20/13 2129  GLUCAP 133* 106* 100*    ABG    Component Value Date/Time   PHART 7.413 05/20/2013 0401   PCO2ART 42.1 05/20/2013 0401   PO2ART 71.0* 05/20/2013 0401   HCO3 26.8* 05/20/2013 0401   TCO2  28 05/20/2013 0401   ACIDBASEDEF 2.0 05/17/2013 1626   O2SAT 94.0 05/20/2013 0401    CXR: PORTABLE CHEST - 1 VIEW  COMPARISON: DG CHEST 1V PORT dated 05/20/2013; CT ANGIO CHEST AORTA  W/CM & WO/CM dated 05/07/2013; DG CHEST 1V PORT dated 05/19/2013; DG  CHEST 1V PORT dated 05/17/2013; DG CHEST 1V PORT dated 05/18/2013  FINDINGS:  Swan-Ganz catheter has been removed. A left IJ sheath and central  line are in unchanged position. Right basilar chest tubes in  unchanged position.  Unchanged marked cardiomegaly. Status post mitral valve replacement.  There is unchanged hazy density in the lower lungs, likely  combination pleural fluid and atelectasis. No evidence of  pneumothorax.  IMPRESSION:  1. Remaining tubes and lines in good position.  2. Unchanged aeration over multiple days. There is venous  congestion, pleural effusions, and bibasilar lung opacity.  Electronically Signed  By: Jorje Guild M.D.  On: 05/21/2013 07:25   Assessment/Plan: S/P Procedure(s) (LRB): MINIMALLY INVASIVE MAZE PROCEDURE (N/A) INTRAOPERATIVE TRANSESOPHAGEAL ECHOCARDIOGRAM (N/A) MINIMALLY INVASIVE MITRAL VALVE (MV) REPLACEMENT (Right)  Overall stable POD5  Maintaining NSR w/ stable hemodynamics off all drips except very low dose milrinone Post-op respiratory failure has resolved, extubated uneventfully 48 hours ago Possible new signs of stroke this morning w/ mild expressive aphasia Expected post op acute blood loss anemia, stable Expected  post op volume excess, diuresing on lasix drip, weight still 10 kg > baseline Diarrhea - reportedly decreased Afebrile w/ WBC stable 11,400, no clinical signs of infection Generalized weakness, physically debilitated state w/ prolonged hospitalization pre-op   D/C milrinone  D/C last remaining chest tube.  Restart metoprolol and add ACE-I  Brain CT r/o stroke  Neuro consult  PT consult  Mobilize as much as possible  Continue ASA + coumadin  Low dose lovenox  for DVT prophylaxis  [name] [name] 05/21/2013 8:13 AM

## 2013-05-22 ENCOUNTER — Inpatient Hospital Stay (HOSPITAL_COMMUNITY): Payer: Medicare HMO

## 2013-05-22 ENCOUNTER — Encounter (HOSPITAL_COMMUNITY): Payer: Self-pay | Admitting: *Deleted

## 2013-05-22 DIAGNOSIS — I509 Heart failure, unspecified: Secondary | ICD-10-CM | POA: Diagnosis not present

## 2013-05-22 DIAGNOSIS — I5033 Acute on chronic diastolic (congestive) heart failure: Secondary | ICD-10-CM | POA: Diagnosis not present

## 2013-05-22 DIAGNOSIS — I4891 Unspecified atrial fibrillation: Secondary | ICD-10-CM | POA: Diagnosis not present

## 2013-05-22 LAB — BASIC METABOLIC PANEL
BUN: 40 mg/dL — ABNORMAL HIGH (ref 6–23)
CALCIUM: 9 mg/dL (ref 8.4–10.5)
CO2: 28 mEq/L (ref 19–32)
Chloride: 99 mEq/L (ref 96–112)
Creatinine, Ser: 1.33 mg/dL — ABNORMAL HIGH (ref 0.50–1.10)
GFR, EST AFRICAN AMERICAN: 45 mL/min — AB (ref >90)
GFR, EST NON AFRICAN AMERICAN: 39 mL/min — AB (ref >90)
GLUCOSE: 126 mg/dL — AB (ref 70–99)
Potassium: 3.2 mEq/L — ABNORMAL LOW (ref 3.7–5.3)
SODIUM: 143 meq/L (ref 137–147)

## 2013-05-22 LAB — CBC
HCT: 27 % — ABNORMAL LOW (ref 36.0–46.0)
Hemoglobin: 8.9 g/dL — ABNORMAL LOW (ref 12.0–15.0)
MCH: 31 pg (ref 26.0–34.0)
MCHC: 33 g/dL (ref 30.0–36.0)
MCV: 94.1 fL (ref 78.0–100.0)
PLATELETS: 185 10*3/uL (ref 150–400)
RBC: 2.87 MIL/uL — ABNORMAL LOW (ref 3.87–5.11)
RDW: 15 % (ref 11.5–15.5)
WBC: 10 10*3/uL (ref 4.0–10.5)

## 2013-05-22 LAB — PROTIME-INR
INR: 1.3 (ref 0.00–1.49)
PROTHROMBIN TIME: 15.9 s — AB (ref 11.6–15.2)

## 2013-05-22 MED ORDER — ASPIRIN EC 81 MG PO TBEC
81.0000 mg | DELAYED_RELEASE_TABLET | Freq: Every day | ORAL | Status: DC
  Administered 2013-05-22 – 2013-05-30 (×9): 81 mg via ORAL
  Filled 2013-05-22 (×9): qty 1

## 2013-05-22 MED ORDER — POTASSIUM CHLORIDE 10 MEQ/50ML IV SOLN
10.0000 meq | INTRAVENOUS | Status: AC
  Administered 2013-05-22 (×2): 10 meq via INTRAVENOUS

## 2013-05-22 MED ORDER — FUROSEMIDE 10 MG/ML IJ SOLN
80.0000 mg | Freq: Two times a day (BID) | INTRAMUSCULAR | Status: DC
  Administered 2013-05-22 – 2013-05-23 (×3): 80 mg via INTRAVENOUS
  Filled 2013-05-22 (×5): qty 8

## 2013-05-22 MED ORDER — SIMVASTATIN 10 MG PO TABS
10.0000 mg | ORAL_TABLET | Freq: Every day | ORAL | Status: DC
  Administered 2013-05-23 – 2013-05-29 (×7): 10 mg via ORAL
  Filled 2013-05-22 (×8): qty 1

## 2013-05-22 MED ORDER — AMIODARONE IV BOLUS ONLY 150 MG/100ML
150.0000 mg | Freq: Once | INTRAVENOUS | Status: AC
  Administered 2013-05-22: 150 mg via INTRAVENOUS
  Filled 2013-05-22: qty 100

## 2013-05-22 MED ORDER — LISINOPRIL 10 MG PO TABS
10.0000 mg | ORAL_TABLET | Freq: Every day | ORAL | Status: DC
  Administered 2013-05-23: 10 mg via ORAL
  Filled 2013-05-22 (×2): qty 1

## 2013-05-22 MED ORDER — POTASSIUM CHLORIDE CRYS ER 20 MEQ PO TBCR
20.0000 meq | EXTENDED_RELEASE_TABLET | Freq: Once | ORAL | Status: AC
  Administered 2013-05-22: 20 meq via ORAL

## 2013-05-22 MED ORDER — POLYSACCHARIDE IRON COMPLEX 150 MG PO CAPS
150.0000 mg | ORAL_CAPSULE | Freq: Every day | ORAL | Status: DC
  Administered 2013-05-22 – 2013-05-30 (×9): 150 mg via ORAL
  Filled 2013-05-22 (×9): qty 1

## 2013-05-22 MED ORDER — FUROSEMIDE 80 MG PO TABS
80.0000 mg | ORAL_TABLET | Freq: Two times a day (BID) | ORAL | Status: DC
  Administered 2013-05-22: 80 mg via ORAL
  Filled 2013-05-22 (×3): qty 1

## 2013-05-22 MED ORDER — POTASSIUM CHLORIDE 10 MEQ/50ML IV SOLN
10.0000 meq | INTRAVENOUS | Status: DC | PRN
  Administered 2013-05-22: 10 meq via INTRAVENOUS
  Filled 2013-05-22 (×5): qty 50

## 2013-05-22 MED ORDER — POTASSIUM CHLORIDE CRYS ER 20 MEQ PO TBCR
20.0000 meq | EXTENDED_RELEASE_TABLET | Freq: Two times a day (BID) | ORAL | Status: DC
  Filled 2013-05-22 (×2): qty 1

## 2013-05-22 NOTE — Progress Notes (Signed)
Dr. Roxy Manns made aware pt's central line is leaking. IV team has come and placed new PIV-will remove central line per MD. Pt did not receive all of ordered IV KCL due to leaking line. Dr. Aundra Dubin at bedside recently and aware-finished replacing dose with po KCL. Pt also receiving amio bolus due to rate controlled afib. Will continue to monitor closely.   Fulton Reek, RN

## 2013-05-22 NOTE — Progress Notes (Addendum)
      [address]       [place] [address]             [phone]        CARDIOTHORACIC SURGERY PROGRESS NOTE   R6 Days Post-Op Procedure(s) (LRB): MINIMALLY INVASIVE MAZE PROCEDURE (N/A) INTRAOPERATIVE TRANSESOPHAGEAL ECHOCARDIOGRAM (N/A) MINIMALLY INVASIVE MITRAL VALVE (MV) REPLACEMENT (Right)  Subjective: Feels quite well.  Talkative and excited about progress.  No pain.  Denies SOB  Objective: Vital signs: BP Readings from Last 1 Encounters:  05/22/13 132/53   Pulse Readings from Last 1 Encounters:  05/22/13 60   Resp Readings from Last 1 Encounters:  05/22/13 17   Temp Readings from Last 1 Encounters:  05/22/13 98.7 F (37.1 C) Oral    Hemodynamics:    Physical Exam:  Rhythm:   sinus  Breath sounds: clear  Heart sounds:  RRR w/out murmur  Incisions:  Clean and dry  Abdomen:  soft  Extremities:  Warm, well perfused, edema has resolved   Intake/Output from previous day: 03/16 0701 - 03/17 0700 In: 751.2 [I.V.:551.2; IV Piggyback:200] Out: 1905 [Urine:1865; Chest Tube:40] Intake/Output this shift:    Lab Results:  CBC: Recent Labs  05/21/13 0346 05/22/13 0344  WBC 11.4* 10.0  HGB 8.6* 8.9*  HCT 25.5* 27.0*  PLT 147* 185    BMET:  Recent Labs  05/21/13 0346 05/22/13 0344  NA 142 143  K 3.7 3.2*  CL 100 99  CO2 28 28  GLUCOSE 130* 126*  BUN 35* 40*  CREATININE 1.33* 1.33*  CALCIUM 9.1 9.0     CBG (last 3)   Recent Labs  05/20/13 1925 05/20/13 2129 05/21/13 0915  GLUCAP 106* 100* 108*    ABG    Component Value Date/Time   PHART 7.413 05/20/2013 0401   PCO2ART 42.1 05/20/2013 0401   PO2ART 71.0* 05/20/2013 0401   HCO3 26.8* 05/20/2013 0401   TCO2 28 05/20/2013 0401   ACIDBASEDEF 2.0 05/17/2013 1626   O2SAT 94.0 05/20/2013 0401    CXR: Continues to improve  Assessment/Plan: S/P Procedure(s) (LRB): MINIMALLY INVASIVE MAZE PROCEDURE (N/A) INTRAOPERATIVE TRANSESOPHAGEAL ECHOCARDIOGRAM (N/A) MINIMALLY  INVASIVE MITRAL VALVE (MV) REPLACEMENT (Right)  Overall stable POD6   Maintaining NSR w/ stable BP Intermittent difficulty w/ speech - seems less like aphasia and perhaps more related to fatigue and/or trauma to larynx Expected post op acute blood loss anemia, stable Expected post op volume excess, weight still 10 kg > baseline but clinically looks like she may be approaching dry weight Hypokalemia - diuretic-induced Diarrhea - reportedly decreased Afebrile w/ WBC stable 10,000, no clinical signs of infection Generalized weakness, physically debilitated state w/ prolonged hospitalization pre-op     continue metoprolol and ACE-I  Supplement K+  Will ask SLP team to evaluate  Must wait at least 2 weeks before MRI/MRA due to surgery  Continue PT   Mobilize as much as possible  Continue ASA + coumadin  Low dose lovenox for DVT prophylaxis until INR increases  D/C pacing wires  Ultimately will likely need d/c to [facility] or other rehab facility   [facility] 05/22/2013 7:33 AM

## 2013-05-22 NOTE — Progress Notes (Addendum)
Physical Therapy Treatment Patient Details Name: [name] [name] MRN: [id number] DOB: Jul 03, 1940 Today's Date: 05/22/2013 Time: 6295-2841 PT Time Calculation (min): 24 min  PT Assessment / Plan / Recommendation  History of Present Illness Patient is a 73 year old obese widowed white female from [place] with remote history of rheumatic fever during childhood and long-standing history of chronic diastolic congestive heart failure who has recently experienced multiple recurrent episodes of acute exacerbation of chronic diastolic congestive heart failure.  She has been diagnosed with severe, symptomatic mitral regurgitation with recurrent paroxysmal atrial fibrillation S/P minimally invasive MVR   PT Comments   Participating well each session.  Very motivated.  Quick to fatigue and then is too weak to assist positioning at end of session.   Follow Up Recommendations  SNF     Does the patient have the potential to tolerate intense rehabilitation     Barriers to Discharge        Equipment Recommendations  Rolling walker with 5" wheels    Recommendations for Other Services    Frequency Min 3X/week   Progress towards PT Goals Progress towards PT goals: Progressing toward goals  Plan Current plan remains appropriate    Precautions / Restrictions Precautions Precautions: Fall   Pertinent Vitals/Pain sats on 2L [place] 95%  HR in 70's; sats when off O2 for short time drop as low as 83%  HR up to 90's in compensation.    Mobility  Bed Mobility Overal bed mobility: Needs Assistance Bed Mobility: Supine to Sit Supine to sit: Mod assist General bed mobility comments: cuing for safety, hand placement Transfers Overall transfer level: Needs assistance Transfers: Sit to/from Stand Sit to Stand: Mod assist Stand pivot transfers: Mod assist General transfer comment: cuing for safety, hand placement Ambulation/Gait Ambulation/Gait assistance: Min assist;+2 safety/equipment Ambulation  Distance (Feet): 40 Feet Assistive device:  (pushed w/c) Gait Pattern/deviations: Step-through pattern;Wide base of support;Trunk flexed Gait velocity: decreased General Gait Details: weak and effortful gait,  shuffled steps as she fatigued    Exercises General Exercises - Lower Extremity Ankle Circles/Pumps: AROM;Strengthening;Both;15 reps;Supine   PT Diagnosis:    PT Problem List:   PT Treatment Interventions:     PT Goals (current goals can now be found in the care plan section) Acute Rehab PT Goals Patient Stated Goal: Get home to my grand daughters home Time For Goal Achievement: 05/22/13 Potential to Achieve Goals: Good  Visit Information  Last PT Received On: 05/22/13 Assistance Needed: +2 History of Present Illness: Patient is a 73 year old obese widowed white female from [place] with remote history of rheumatic fever during childhood and long-standing history of chronic diastolic congestive heart failure who has recently experienced multiple recurrent episodes of acute exacerbation of chronic diastolic congestive heart failure.  She has been diagnosed with severe, symptomatic mitral regurgitation with recurrent paroxysmal atrial fibrillation S/P minimally invasive MVR    Subjective Data  Subjective: I can't go another step...tired...exhausted...going to sleep Patient Stated Goal: Get home to my grand daughters home   Cognition  Cognition Arousal/Alertness: Awake/alert Behavior During Therapy: WFL for tasks assessed/performed Overall Cognitive Status: Impaired/Different from baseline    Balance  Balance Standing balance support: Bilateral upper extremity supported Standing balance-[name] Scale: Poor  End of Session PT - End of Session Equipment Utilized During Treatment: Gait belt;Oxygen Activity Tolerance: Patient tolerated treatment well;Patient limited by fatigue Patient left: in chair;with call bell/phone within reach Nurse Communication: Mobility status   GP      Tyheim Vanalstyne, Tessie Fass 05/22/2013, 12:02  PM 05/22/2013  Donnella Sham, [place] [phone]  (pager)

## 2013-05-22 NOTE — Progress Notes (Signed)
CSW continuing to follow patient for dc plans as needed.  Jeanette Caprice, MSW, [place]

## 2013-05-22 NOTE — Evaluation (Signed)
Speech Language Pathology Evaluation Patient Details Name: [name] [name] MRN: [id number] DOB: March 21, 1940 Today's Date: 05/22/2013 Time: 3295-1884 SLP Time Calculation (min): 26 min  Problem List:  Patient Active Problem List   Diagnosis Date Noted  . CVA (cerebral infarction) 05/21/2013  . Acute pulmonary edema 05/18/2013  . 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 S/P Maze operation for atrial fibrillation 05/16/2013  . Aortic insufficiency due to bicuspid aortic valve   . Chronic kidney disease   . Paroxysmal atrial fibrillation   . Chronic diastolic congestive heart failure   . Arthritis   . Acute respiratory failure 05/02/2013  . Atrial fibrillation with RVR 04/29/2013  . Shortness of breath   . Juvenile rheumatic fever   . Diastolic CHF, acute on chronic 04/23/2013  . CHF (congestive heart failure) 04/23/2013  . Mitral regurgitation 02/16/2013  . Diastolic heart failure 16/60/6301  . HTN (hypertension) 02/15/2011   Past Medical History:  Past Medical History  Diagnosis Date  . Chronic airway obstruction, not elsewhere classified   . Precordial pain   . Swelling of limb   . Dizziness and giddiness   . Unspecified transient cerebral ischemia     Diag. 2003  . Acute, but ill-defined, cerebrovascular disease     2000 [place]  . Shortness of breath   . Obesity   . Juvenile rheumatic fever     age 55  . Unspecified essential hypertension   . Hyperlipidemia   . Mitral regurgitation 02/16/2013  . Chronic kidney disease   . CHF (congestive heart failure) 04/23/2013  . Paroxysmal atrial fibrillation 02/16/2013    Recurrent paroxysmal atrial fibrillation, first diagnosed December 2014  . Chronic diastolic congestive heart failure   . Arthritis     Chronic left knee pain  . Aortic insufficiency due to bicuspid aortic valve     moderate by TEE  . S/P minimally invasive mitral valve replacement with  bioprosthetic valve and maze procedure 05/16/2013    27 mm Edwards magna mitral bovine bioprosthetic tissue valve placed via right thoracotomy  . S/P Maze operation for atrial fibrillation 05/16/2013    Complete bilateral atrial lesion set using cryothermy and bipolar radiofrequency ablation with oversewing of LA appendage   Past Surgical History:  Past Surgical History  Procedure Laterality Date  . Total knee arthroplasty Right   . Abdominal hysterectomy      Cervical Cancer  . Parathyroid/thyroid surgery      tumor  . Tee without cardioversion N/A 04/06/2013    Procedure: TRANSESOPHAGEAL ECHOCARDIOGRAM (TEE);  Surgeon: [name], MD;  [facility];  Service: Cardiology;  Laterality: N/A;  . Minimally invasive maze procedure N/A 05/16/2013    Procedure: MINIMALLY INVASIVE MAZE PROCEDURE;  Surgeon: Rexene Alberts, MD;  Location: [facility];  Service: Open Heart Surgery;  Laterality: N/A;  . Intraoperative transesophageal echocardiogram N/A 05/16/2013    Procedure: INTRAOPERATIVE TRANSESOPHAGEAL ECHOCARDIOGRAM;  Surgeon: Rexene Alberts, MD;  Location: [facility];  Service: Open Heart Surgery;  Laterality: N/A;  . Mitral valve replacement Right 05/16/2013    Procedure: MINIMALLY INVASIVE MITRAL VALVE (MV) REPLACEMENT;  Surgeon: Rexene Alberts, MD;  Location: [facility];  Service: Open Heart Surgery;  Laterality: Right;   HPI:  73 y.o. female s/p mitral valve replacement who awakened 3/16 with slurred speech.  There is question of stroke. Per neurology, patient was quite fluent and for the majority of a long conversation only exhibited  hoarseness. Head CT showed no acute changes.  Pt with hx CVA 2000.  Daughter and granddaughter present.     Assessment / Plan / Recommendation Clinical Impression  Pt presents with no aphasia, but new deficits in cognition that are changed from baseline, according to daughter and granddaughter.  Pt is more impulsive, demonstrates attentional and awareness  deficits,  demonstrates decreased pragmatics with impaired self-regulation and topic maintenance.  Drawing of clock face reveals mild visual-perceptual deficits. Daughter reports changes in short-term memory in the last year.  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  Recommend SLP f/u for acute changes in cognition and intervention once transferred to SNF.  Recommended to daughter that if voice does not improved in 2-3 weeks, she could consider ENT consult.  Daughter in agreement.       SLP Assessment  Patient needs continued Speech Lanaguage Pathology Services    Follow Up Recommendations  Skilled Nursing facility    Frequency and Duration min 1 x/week  1 week      SLP Goals  SLP Goals Potential to Achieve Goals: Good  SLP Evaluation Prior Functioning  Cognitive/Linguistic Baseline: Baseline deficits Baseline deficit details: short-term memory deficits in the last year per family Type of Home: House  Lives With: Family (grandson and his wife, children) Available Help at Discharge: Family;Available 24 hours/day   Cognition  Overall Cognitive Status: Impaired/Different from baseline Arousal/Alertness: Awake/alert Orientation Level: Oriented X4 Attention: Sustained Memory: Impaired Memory Impairment: Storage deficit;Retrieval deficit Awareness: Impaired Awareness Impairment: Emergent impairment Problem Solving: Impaired Problem Solving Impairment: Verbal basic Executive Function: Self Monitoring Self Monitoring: Impaired Behaviors: Impulsive Safety/Judgment: Impaired    Comprehension  Auditory Comprehension Overall Auditory Comprehension: Appears within functional limits for tasks assessed Visual Recognition/Discrimination Discrimination: Within Function Limits Reading Comprehension Reading Status: Within funtional limits     Expression Expression Primary Mode of Expression: Verbal Verbal Expression Overall Verbal Expression: Appears within functional limits for tasks assessed Written Expression Dominant Hand: Right   Oral / Motor Oral Motor/Sensory Function Overall Oral Motor/Sensory Function: Appears within functional limits for tasks assessed Motor Speech Overall Motor Speech: Impaired Respiration: Impaired Level of Impairment: Sentence Phonation: Hoarse Resonance: Within functional limits Articulation: Within functional limitis Intelligibility: Intelligible   GO     Juan Quam Laurice 05/22/2013, 10:36 AM

## 2013-05-22 NOTE — Progress Notes (Signed)
Chaplain gave support through his presence, conversation and empathic listening.  Pt and her family expressed gratitude for chaplain's visit.    05/22/13 1400  Clinical Encounter Type  Visited With Patient and family together  Visit Type Spiritual support   [name] [name], chaplain [phone]

## 2013-05-22 NOTE — Progress Notes (Signed)
[name], female   DOB: 17-Mar-1940, 73 y.o.   MRN: [id number]   SUBJECTIVE: Now s/p minimally invasive MV repair and Maze.  She is doing better.  Walked today.  She went back into atrial fibrillation this afternoon with controlled rate.    Scheduled Meds: . amiodarone  150 mg Intravenous Once  . amiodarone  200 mg Oral BID  . aspirin EC  81 mg Oral Daily  . darifenacin  7.5 mg Oral Daily  . enoxaparin (LOVENOX) injection  30 mg Subcutaneous Q24H  . feeding supplement (GLUCERNA SHAKE)  237 mL Oral BID BM  . furosemide  80 mg Intravenous BID  . iron polysaccharides  150 mg Oral Daily  . [START ON 05/23/2013] lisinopril  10 mg Oral Daily  . metoprolol tartrate  25 mg Oral BID  . potassium chloride  10 mEq Intravenous Q1 Hr x 3  . [START ON 05/23/2013] potassium chloride SA  20 mEq Oral BID  . potassium chloride  20 mEq Oral Once  . sodium chloride  10-40 mL Intracatheter Q12H  . sodium chloride  3 mL Intravenous Q12H  . warfarin  2.5 mg Oral q1800  . Warfarin - Physician Dosing Inpatient   Does not apply q1800   Continuous Infusions:   PRN Meds:.sodium chloride, acetaminophen, guaiFENesin, levalbuterol, ondansetron (ZOFRAN) IV, potassium chloride, sodium chloride, sodium chloride, traMADol    Filed Vitals:   05/22/13 1100 05/22/13 1200 05/22/13 1217 05/22/13 1300  BP: 163/59 129/50    Pulse: 80 89  74  Temp:   98.1 F (36.7 C)   TempSrc:   Oral   Resp: 18 26  25   Height:      Weight:      SpO2: 94% 99%  97%    Intake/Output Summary (Last 24 hours) at 05/22/13 1454 Last data filed at 05/22/13 1128  Gross per 24 hour  Intake    572 ml  Output   1340 ml  Net   -768 ml    LABS: Basic Metabolic Panel:  Recent Labs  05/21/13 0346 05/22/13 0344  NA 142 143  K 3.7 3.2*  CL 100 99  CO2 28 28  GLUCOSE 130* 126*  BUN 35* 40*  CREATININE 1.33* 1.33*  CALCIUM 9.1 9.0   Liver Function Tests:  Recent Labs  05/21/13 0346  AST 29  ALT 30    ALKPHOS 76  BILITOT 0.6  PROT 5.7*  ALBUMIN 2.6*   No results found for this basename: LIPASE, AMYLASE,  in the last 72 hours CBC:  Recent Labs  05/21/13 0346 05/22/13 0344  WBC 11.4* 10.0  HGB 8.6* 8.9*  HCT 25.5* 27.0*  MCV 92.4 94.1  PLT 147* 185   Cardiac Enzymes: No results found for this basename: CKTOTAL, CKMB, CKMBINDEX, TROPONINI,  in the last 72 hours BNP: No components found with this basename: POCBNP,  D-Dimer: No results found for this basename: DDIMER,  in the last 72 hours Hemoglobin A1C: No results found for this basename: HGBA1C,  in the last 72 hours Fasting Lipid Panel: No results found for this basename: CHOL, HDL, LDLCALC, TRIG, CHOLHDL, LDLDIRECT,  in the last 72 hours Thyroid Function Tests: No results found for this basename: TSH, T4TOTAL, FREET3, T3FREE, THYROIDAB,  in the last 72 hours Anemia Panel: No results found for this basename: VITAMINB12, FOLATE, FERRITIN, TIBC, IRON, RETICCTPCT,  in the last 72 hours   PHYSICAL EXAM General: NAD, intubated Neck: JVP 12 cm, no thyromegaly  or thyroid nodule.  Lungs: Dependent crackles CV: Nondisplaced PMI.  Heart irregular S1/S2, no S3/S4, no murmur.  1+ edema 1/2 to knees bilaterally.   Abdomen: Soft, nontender, no hepatosplenomegaly, no distention.  Neurologic: Intubated/sedated Extremities: No clubbing or cyanosis.   TELEMETRY: Reviewed telemetry pt in atrial fibrillation in 80s  ASSESSMENT AND PLAN: 73 yo was transferred to [facility] for evaluation of mitral regurgitation for potential repair.  She had acute on chronic diastolic CHF and paroxysmal atrial fibrillation.  She underwent minimally invasive MV repair and Maze.    1. Mitral regurgitation: Severe MR, s/p minimally invasive MV repair and Maze.   2. Acute on chronic diastolic CHF: Patient does still look quite volume overloaded.  Would replete K and keep Lasix IV, would give Lasix 80 mg IV bid for now.  3. Atrial fibrillation: She is in atrial  fibrillation again, HR controlled (was in NSR this morning.   - Bolus amiodarone 150 mg IV. - Continue warfarin (valvular atrial fibrillation.   4. CKD:  Stable creatinine.   5. Episodes of expressive aphasia: ?TIA versus small embolic CVAs, only prior CVA on head CT.  Cannot do MRI at this time (too close to surgery).  Continue warfarin and ASA 81.   [name] 05/22/2013 2:57 PM    [name] 05/22/2013 2:54 PM

## 2013-05-22 NOTE — Progress Notes (Signed)
Stroke Team Progress Note  HISTORY [name] [name] is an 73 y.o. female who underwent a minimally invasive mitral valve replacement And Maze procedure on 05/16/13. Patient had been doing well and recovering well but this AM 05/21/2013 it was noted he had new grabbled speech and word finding difficulty. Nurse at bedside states this AM she was having difficulty getting her words out but had no problems writing.   Patient states March in 2000 she had a stroke but this affected her gait and she had no problems with her speech. Currently she is in Afib and on ASA and coumadin with last INR of 1.16. Upon consultation she has a hoarse voice but has no problems expressing herself or following commands. She is able to tell me in depth the symptoms and events of her last stroke in 2000, able to name objects, repeat words, follow commands. Neurology was consulted for possible CVA. Head CT showed No identifiable acute stroke. 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 Chronic small vessel change of the hemispheric white matter. Of note on 05/20/13 she had one BP 83/57 but since then her BP has been 150's/50's. Patient was not administerd TPA secondary to unknown time last known well.  SUBJECTIVE Her family and student RN are at the bedside.  Overall she feels her condition is ongoing, with periodic episodes with difficulty speaking.   OBJECTIVE Most recent Vital Signs: Filed Vitals:   05/22/13 0600 05/22/13 0700 05/22/13 0800 05/22/13 0824  BP: 114/59 132/53 163/42   Pulse: 62 60 63   Temp:    97.7 F (36.5 C)  TempSrc:    Oral  Resp: 23 17 21    Height:      Weight:      SpO2: 97% 94% 97%    CBG (last 3)   Recent Labs  05/20/13 1925 05/20/13 2129 05/21/13 0915  GLUCAP 106* 100* 108*    IV Fluid Intake:     MEDICATIONS  . amiodarone  200 mg Oral BID  . aspirin EC  81 mg Oral Daily  . darifenacin  7.5 mg Oral Daily  . enoxaparin (LOVENOX) injection  30 mg  Subcutaneous Q24H  . feeding supplement (GLUCERNA SHAKE)  237 mL Oral BID BM  . furosemide  80 mg Oral BID  . iron polysaccharides  150 mg Oral Daily  . lisinopril  5 mg Oral Daily  . metoprolol tartrate  25 mg Oral BID  . potassium chloride  10 mEq Intravenous Q1 Hr x 3  . [START ON 05/23/2013] potassium chloride SA  20 mEq Oral BID  . sodium chloride  10-40 mL Intracatheter Q12H  . sodium chloride  3 mL Intravenous Q12H  . warfarin  2.5 mg Oral q1800  . Warfarin - Physician Dosing Inpatient   Does not apply q1800   PRN:  sodium chloride, acetaminophen, guaiFENesin, levalbuterol, ondansetron (ZOFRAN) IV, potassium chloride, sodium chloride, sodium chloride, traMADol  Diet:  Dysphagia 3 thin liquids Activity:  Up in chair  DVT Prophylaxis:  Lovenox 30 mg sq daily   CLINICALLY SIGNIFICANT STUDIES Basic Metabolic Panel:  Recent Labs Lab 05/17/13 1700  05/18/13 1559 05/19/13 0350  05/21/13 0346 05/22/13 0344  NA  --   < > 141 142  < > 142 143  K  --   < > 3.5* 3.2*  < > 3.7 3.2*  CL  --   < > 104 102  < > 100 99  CO2  --   < >  24 24  < > 28 28  GLUCOSE  --   < > 175* 143*  < > 130* 126*  BUN  --   < > 26* 26*  < > 35* 40*  CREATININE 1.27*  < > 1.33* 1.35*  < > 1.33* 1.33*  CALCIUM  --   < > 8.7 9.2  < > 9.1 9.0  MG 2.8*  --   --  2.1  --   --   --   PHOS  --   --   --  4.1  --   --   --   < > = values in this interval not displayed. Liver Function Tests:   Recent Labs Lab 05/21/13 0346  AST 29  ALT 30  ALKPHOS 76  BILITOT 0.6  PROT 5.7*  ALBUMIN 2.6*   CBC:   Recent Labs Lab 05/21/13 0346 05/22/13 0344  WBC 11.4* 10.0  HGB 8.6* 8.9*  HCT 25.5* 27.0*  MCV 92.4 94.1  PLT 147* 185   Coagulation:   Recent Labs Lab 05/15/13 1837 05/16/13 1930 05/20/13 0330 05/22/13 0344  LABPROT 12.5 16.0* 14.6 15.9*  INR 0.95 1.31 1.16 1.30   Cardiac Enzymes: No results found for this basename: CKTOTAL, CKMB, CKMBINDEX, TROPONINI,  in the last 168 hours Urinalysis:    Recent Labs Lab 05/16/13 0241 05/18/13 1408  COLORURINE YELLOW YELLOW  LABSPEC 1.009 1.019  PHURINE 7.5 5.0  GLUCOSEU NEGATIVE NEGATIVE  HGBUR NEGATIVE NEGATIVE  BILIRUBINUR NEGATIVE NEGATIVE  KETONESUR NEGATIVE NEGATIVE  PROTEINUR NEGATIVE NEGATIVE  UROBILINOGEN 0.2 0.2  NITRITE NEGATIVE NEGATIVE  LEUKOCYTESUR TRACE* NEGATIVE   Lipid Panel No results found for this basename: chol,  trig,  hdl,  cholhdl,  vldl,  ldlcalc   HgbA1C  Lab Results  Component Value Date   HGBA1C 5.9* 05/15/2013    Urine Drug Screen:   No results found for this basename: labopia,  cocainscrnur,  labbenz,  amphetmu,  thcu,  labbarb    Alcohol Level: No results found for this basename: ETH,  in the last 168 hours   CT of the brain  05/21/2013   No identifiable acute stroke by CT.  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 Chronic small vessel change of the hemispheric white matter.     MRI of the brain    MRA of the brain    2D Echocardiogram  EF 55-60% with no source of embolus.   Carotid Doppler  No evidence of hemodynamically significant internal carotid artery stenosis. Vertebral artery flow is antegrade.   TEE 3/15/215 no intracardiac massess or thrombi  CXR  05/21/2013    1. Remaining tubes and lines in good position. 2. Unchanged aeration over multiple days. There is venous congestion, pleural effusions, and bibasilar lung opacity.     EKG  atrial fibrillation, rate 61. For complete results please see formal report.   Therapy Recommendations HH PT (or SNF)  Physical Exam   Pleasant elderly caucasian lady not in distress. Sternal  Dressing from recent  surgery.Awake alert. Afebrile. Head is nontraumatic. Neck is supple without bruit. Hearing is normal. Cardiac exam no murmur or gallop. Lungs are clear to auscultation. Distal pulses are well felt. Neurological Exam ; awake alert oriented x3. Mild intermittent dysarthria with hypophonia. No aphasia E. an  follows commands well. Extraocular movements are full range without nystagmus. Francee Piccolo is brisk. No upper or lower extremity drift. Symmetric and equal strength in all extremities.. Deep tendon  reflexes are 2+ brisk symmetric. Plantars are both downgoing. No focal weakness or sensory loss. Gait was not tested. Finger-to-nose coordination is accurate. ASSESSMENT [name] [name] is a 73 y.o. female who is recovery post minimally invasive mitral valve replacement & MAZE procedure for afib, who in the hospital developed transient expressive aphasia. Initial imaging negative. Suspect small embolic infarcts vs TIA vs effects of old strokes seen on MRI (bilateral thalamic lacunes) If infarct present, likely embolic secondary to known atrial fibrillation; final dx pending.  On aspirin 81 mg orally every day prior to admission. Now on aspirin 81 mg orally every day and warfarin for secondary stroke prevention. Patient with resultant periods of expressive aphaisa, pseudobulbar affect. Stroke work up underway.  atrial fibrillation hypertension Hx hyperlipidemia, LDL not drawn, on pravachol 20 mg daily PTA, now on no statin, goal LDL < 100 CHF Chronic kidney disease  Hx stroke March 2000, affected gait which resolved; emotionally labile following, cries and laughs per family, sounds like pseudobulbar palsy, affect  Hospital day # 24  TREATMENT/PLAN  Continue aspirin 81 mg orally every day and warfarin for secondary stroke prevention.  Check lipid panel in am  CT angio as MRI cannot be done for 2 weeks per Dr Roxy Manns  Resume statin  Disposition per therapy  Burnetta Sabin, MSN, RN, ANVP-BC, AGPCNP-BC [facility] Pager: [phone] 05/22/2013 10:32 AM  I have personally obtained a history, examined the patient, evaluated imaging results, and formulated the assessment and plan of care. I agree with the above. Antony Contras, MD   To contact Stroke Continuity provider, please refer to  [email or web address] After hours, contact General Neurology

## 2013-05-23 ENCOUNTER — Inpatient Hospital Stay (HOSPITAL_COMMUNITY): Payer: Medicare HMO

## 2013-05-23 LAB — LIPID PANEL
CHOL/HDL RATIO: 3.3 ratio
Cholesterol: 89 mg/dL (ref 0–200)
HDL: 27 mg/dL — ABNORMAL LOW (ref >39)
LDL CALC: 35 mg/dL (ref 0–99)
TRIGLYCERIDES: 134 mg/dL (ref <150)
VLDL: 27 mg/dL (ref 0–40)

## 2013-05-23 LAB — BASIC METABOLIC PANEL
BUN: 44 mg/dL — ABNORMAL HIGH (ref 6–23)
CHLORIDE: 97 meq/L (ref 96–112)
CO2: 33 meq/L — AB (ref 19–32)
Calcium: 9.5 mg/dL (ref 8.4–10.5)
Creatinine, Ser: 1.31 mg/dL — ABNORMAL HIGH (ref 0.50–1.10)
GFR calc Af Amer: 46 mL/min — ABNORMAL LOW (ref >90)
GFR, EST NON AFRICAN AMERICAN: 40 mL/min — AB (ref >90)
GLUCOSE: 127 mg/dL — AB (ref 70–99)
POTASSIUM: 3 meq/L — AB (ref 3.7–5.3)
Sodium: 144 mEq/L (ref 137–147)

## 2013-05-23 LAB — PROTIME-INR
INR: 1.32 (ref 0.00–1.49)
Prothrombin Time: 16.1 seconds — ABNORMAL HIGH (ref 11.6–15.2)

## 2013-05-23 MED ORDER — POTASSIUM CHLORIDE CRYS ER 20 MEQ PO TBCR
40.0000 meq | EXTENDED_RELEASE_TABLET | Freq: Once | ORAL | Status: AC
  Administered 2013-05-23: 40 meq via ORAL

## 2013-05-23 MED ORDER — POTASSIUM CHLORIDE CRYS ER 20 MEQ PO TBCR
40.0000 meq | EXTENDED_RELEASE_TABLET | Freq: Two times a day (BID) | ORAL | Status: DC
  Administered 2013-05-23 (×2): 40 meq via ORAL
  Filled 2013-05-23 (×4): qty 2

## 2013-05-23 NOTE — Progress Notes (Signed)
CSW spoke to patient confirming that patient still wants [place] [facility]. Patient states she still wants that facility and is eager to get out of hospital.  CSW continuing to follow.  Jeanette Caprice, MSW, [place]

## 2013-05-23 NOTE — Progress Notes (Signed)
Stroke Team Progress Note  HISTORY [name] [name] is an 73 y.o. female who underwent a minimally invasive mitral valve replacement And Maze procedure on 05/16/13. Patient had been doing well and recovering well but this AM 05/21/2013 it was noted he had new grabbled speech and word finding difficulty. Nurse at bedside states this AM she was having difficulty getting her words out but had no problems writing.   Patient states March in 2000 she had a stroke but this affected her gait and she had no problems with her speech. Currently she is in Afib and on ASA and coumadin with last INR of 1.16. Upon consultation she has a hoarse voice but has no problems expressing herself or following commands. She is able to tell me in depth the symptoms and events of her last stroke in 2000, able to name objects, repeat words, follow commands. Neurology was consulted for possible CVA. Head CT showed No identifiable acute stroke. 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 Chronic small vessel change of the hemispheric white matter. Of note on 05/20/13 she had one BP 83/57 but since then her BP has been 150's/50's. Patient was not administerd TPA secondary to unknown time last known well.  SUBJECTIVE Patient up in chair at bedside; now on the floor.  OBJECTIVE Most recent Vital Signs: Filed Vitals:   05/22/13 1737 05/22/13 2006 05/23/13 0202 05/23/13 0303  BP: 117/65 127/96  127/55  Pulse: 72 64  69  Temp:  98.4 F (36.9 C)  98.3 F (36.8 C)  TempSrc:  Oral  Oral  Resp: 24 24  20   Height:      Weight:   91.9 kg (202 lb 9.6 oz)   SpO2: 98% 95%  98%   CBG (last 3)   Recent Labs  05/20/13 1925 05/20/13 2129 05/21/13 0915  GLUCAP 106* 100* 108*    IV Fluid Intake:     MEDICATIONS  . amiodarone  200 mg Oral BID  . aspirin EC  81 mg Oral Daily  . darifenacin  7.5 mg Oral Daily  . enoxaparin (LOVENOX) injection  30 mg Subcutaneous Q24H  . feeding supplement (GLUCERNA  SHAKE)  237 mL Oral BID BM  . furosemide  80 mg Intravenous BID  . iron polysaccharides  150 mg Oral Daily  . lisinopril  10 mg Oral Daily  . metoprolol tartrate  25 mg Oral BID  . potassium chloride SA  20 mEq Oral BID  . simvastatin  10 mg Oral q1800  . sodium chloride  10-40 mL Intracatheter Q12H  . sodium chloride  3 mL Intravenous Q12H  . warfarin  2.5 mg Oral q1800  . Warfarin - Physician Dosing Inpatient   Does not apply q1800   PRN:  sodium chloride, acetaminophen, guaiFENesin, levalbuterol, ondansetron (ZOFRAN) IV, potassium chloride, sodium chloride, sodium chloride, traMADol  Diet:  Dysphagia 3 thin liquids Activity:  Up in chair  DVT Prophylaxis:  Lovenox 30 mg sq daily   CLINICALLY SIGNIFICANT STUDIES Basic Metabolic Panel:  Recent Labs Lab 05/17/13 1700  05/18/13 1559 05/19/13 0350  05/22/13 0344 05/23/13 0240  NA  --   < > 141 142  < > 143 144  K  --   < > 3.5* 3.2*  < > 3.2* 3.0*  CL  --   < > 104 102  < > 99 97  CO2  --   < > 24 24  < > 28 33*  GLUCOSE  --   < >  175* 143*  < > 126* 127*  BUN  --   < > 26* 26*  < > 40* 44*  CREATININE 1.27*  < > 1.33* 1.35*  < > 1.33* 1.31*  CALCIUM  --   < > 8.7 9.2  < > 9.0 9.5  MG 2.8*  --   --  2.1  --   --   --   PHOS  --   --   --  4.1  --   --   --   < > = values in this interval not displayed. Liver Function Tests:   Recent Labs Lab 05/21/13 0346  AST 29  ALT 30  ALKPHOS 76  BILITOT 0.6  PROT 5.7*  ALBUMIN 2.6*   CBC:   Recent Labs Lab 05/21/13 0346 05/22/13 0344  WBC 11.4* 10.0  HGB 8.6* 8.9*  HCT 25.5* 27.0*  MCV 92.4 94.1  PLT 147* 185   Coagulation:   Recent Labs Lab 05/16/13 1930 05/20/13 0330 05/22/13 0344 05/23/13 0240  LABPROT 16.0* 14.6 15.9* 16.1*  INR 1.31 1.16 1.30 1.32   Cardiac Enzymes: No results found for this basename: CKTOTAL, CKMB, CKMBINDEX, TROPONINI,  in the last 168 hours Urinalysis:   Recent Labs Lab 05/18/13 1408  COLORURINE YELLOW  LABSPEC 1.019  PHURINE  5.0  GLUCOSEU NEGATIVE  HGBUR NEGATIVE  BILIRUBINUR NEGATIVE  KETONESUR NEGATIVE  PROTEINUR NEGATIVE  UROBILINOGEN 0.2  NITRITE NEGATIVE  LEUKOCYTESUR NEGATIVE   Lipid Panel     Component Value Date/Time   CHOL 89 05/23/2013 0240   TRIG 134 05/23/2013 0240   HDL 27* 05/23/2013 0240   CHOLHDL 3.3 05/23/2013 0240   VLDL 27 05/23/2013 0240   LDLCALC 35 05/23/2013 0240   HgbA1C  Lab Results  Component Value Date   HGBA1C 5.9* 05/15/2013    Urine Drug Screen:   No results found for this basename: labopia,  cocainscrnur,  labbenz,  amphetmu,  thcu,  labbarb    Alcohol Level: No results found for this basename: ETH,  in the last 168 hours   CT of the brain   05/22/2013 No change from prior study. No acute findings. 05/21/2013   No identifiable acute stroke by CT.  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 Chronic small vessel change of the hemispheric white matter.     2D Echocardiogram  EF 55-60% with no source of embolus.   Carotid Doppler  No evidence of hemodynamically significant internal carotid artery stenosis. Vertebral artery flow is antegrade.   TEE 3/15/215 no intracardiac massess or thrombi  CXR  05/21/2013    1. Remaining tubes and lines in good position. 2. Unchanged aeration over multiple days. There is venous congestion, pleural effusions, and bibasilar lung opacity.     EKG  atrial fibrillation, rate 61. For complete results please see formal report.   Therapy Recommendations HH PT (or SNF), ST (SNF)  Physical Exam   Pleasant elderly caucasian lady not in distress. Sternal  Dressing from recent  surgery.Awake alert. Afebrile. Head is nontraumatic. Neck is supple without bruit. Hearing is normal. Cardiac exam no murmur or gallop. Lungs are clear to auscultation. Distal pulses are well felt. Neurological Exam ; awake alert oriented x3. Mild intermittent dysarthria with hypophonia. No aphasia E. an follows commands well. Extraocular  movements are full range without nystagmus. Francee Piccolo is brisk. No upper or lower extremity drift. Symmetric and equal strength in all extremities.. Deep tendon reflexes are 2+ brisk  symmetric. Plantars are both downgoing. No focal weakness or sensory loss. Gait was not tested. Finger-to-nose coordination is accurate.  ASSESSMENT [name] [name] is a 73 y.o. female who is recovery post minimally invasive mitral valve replacement & MAZE procedure for afib, who in the hospital developed transient expressive aphasia. Initial and repeat CT imaging negative. Dx: possible TIA as stroke not seen on CT; no need to do MRI.  On aspirin 81 mg orally every day prior to admission. Now on aspirin 81 mg orally every day and warfarin for secondary stroke prevention. 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 Stroke work up completed.  atrial fibrillation hypertension Hx hyperlipidemia, LDL 35, on pravachol 20 mg daily PTA, now on zocor 10 mg, goal LDL < 100 CHF Chronic kidney disease  Hx stroke March 2000, affected gait which resolved; emotionally labile following, cries and laughs per family, sounds like pseudobulbar palsy, affect  Hospital day # 25  TREATMENT/PLAN  Continue aspirin 81 mg orally every day and warfarin for secondary stroke prevention.  Disposition per therapy  Nothing further to add from the stroke standpoint  Will sign off. Please call for questions.  Burnetta Sabin, MSN, RN, ANVP-BC, AGPCNP-BC [facility] Pager: [phone] 05/23/2013 7:55 AM  I have personally obtained a history, examined the patient, evaluated imaging results, and formulated the assessment and plan of care. I agree with the above.  Antony Contras, MD   To contact Stroke Continuity provider, please refer to [email or web address] After hours, contact General Neurology

## 2013-05-23 NOTE — Discharge Summary (Signed)
Physician Discharge Summary  [name] MRN: [id number] DOB/AGE: 09/25/1940 73 y.o.  Admit date: 04/28/2013 Discharge date: 05/30/2013  Admission Diagnoses:  Patient Active Problem List   Diagnosis Date Noted  . Aortic insufficiency due to bicuspid aortic valve   . Chronic kidney disease   . Paroxysmal atrial fibrillation   . Chronic diastolic congestive heart failure   . Arthritis   . Acute respiratory failure 05/02/2013  . Atrial fibrillation with RVR 04/29/2013  . Shortness of breath   . Juvenile rheumatic fever   . Diastolic CHF, acute on chronic 04/23/2013  . CHF (congestive heart failure) 04/23/2013  . Mitral regurgitation 02/16/2013  . Diastolic heart failure 18/84/1660  . HTN (hypertension) 02/15/2011   Discharge Diagnoses:   Patient Active Problem List   Diagnosis Date Noted  . CVA (cerebral infarction) 05/21/2013  . Acute pulmonary edema 05/18/2013  . 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 S/P Maze operation for atrial fibrillation 05/16/2013  . Aortic insufficiency due to bicuspid aortic valve   . Chronic kidney disease   . Paroxysmal atrial fibrillation   . Chronic diastolic congestive heart failure   . Arthritis   . Acute respiratory failure 05/02/2013  . Atrial fibrillation with RVR 04/29/2013  . Shortness of breath   . Juvenile rheumatic fever   . Diastolic CHF, acute on chronic 04/23/2013  . CHF (congestive heart failure) 04/23/2013  . Mitral regurgitation 02/16/2013  . Diastolic heart failure 63/03/6008  . HTN (hypertension) 02/15/2011   Discharged Condition: good  History of Present Illness:   [name] [name] is a 73 yo obese white female with known history of rheumatic fever during childhood and long standing history of chronic diastolic congestive heart failure. The patient has been admitted several times with recurrent exacerbations of CHF.  Most recently she  has progressed to severe symptomatic Mitral Regurgitation with Paroxysmal Atrial Fibrillation, prompting referral for surgical evaluation.  The patient was evaluated by Dr. Roxy Manns on 05/07/13.  At that time patient admitted to being treated for rheumatic heart disease at the age of 73.  She has a long history of a heart murmur and reported occasional history of irregular heartbeat. She has been treated for chronic diastolic congestive heart failure with bilateral lower extremity edema and exertional shortness of breath for several years. In early December 2014 she was hospitalized at [facility] in [place] with an acute exacerbation of chronic diastolic congestive heart failure. She was diagnosed with paroxysmal atrial fibrillation at that time and started on metoprolol. Since then she has been followed by Dr. Harl Bowie as an outpatient. She has been hospitalized four additional times over the past 2 months with recurrent paroxysmal atrial fibrillation and acute exacerbations of chronic diastolic congestive heart failure. In January she underwent transesophageal echocardiogram on 04/06/2013 which demonstrated severe mitral regurgitation with mild to moderate aortic insufficiency and normal left ventricular systolic function. She was treated medically, but she was later readmitted to the hospital 04/28/2013 with another acute exacerbation of congestive heart failure. During this hospitalization she has again experienced recurrent episodes of paroxysmal atrial fibrillation as well as resting shortness of breath and pulmonary edema. She underwent left and right heart catheterization on 05/04/2013. This revealed normal coronary arteries with no significant coronary artery disease. There was severe pulmonary hypertension with large V waves consistent with severe mitral regurgitation.  After review of her Echocardiogram and overall medical problems it was felt she would benefit from  surgical intervention on her Mitral Valve  with MAZE procedure for Atrial Fibrillation.  The risks and benefits of the procedure were explained to the patient and she was agreeable to proceed.     Hospital Course:   The patient was treated for her CHF exacerbation for several days, prior to proceeding with valve surgery.  She was followed closely by Dr. Roxy Manns who felt she was making progress.  She was taken to the operating room on 05/16/2013.  She underwent Minimally Invasive Mitral Valve Replacement utilizing a 27 mm [employer] Pericardial Tissue Valve.  She also underwent MAZE procedure.  She tolerated the procedure well and was taken to the SICU in stable condition.  During her stay in the ICU the patient was extubated on POD #1.  However, later that day patient required re-intubation for respiratory distress.  CCM was consulted to aid with vent management.  CXR showed questionable opacities in lung base.  Sputum culture was obtained and she was placed on empiric antibiotic coverage.  She was weaned off Dopamine and Milrinone as tolerated.  She was diuresed with Lasix gtt for CHF and post operative volume excess.  Patients respiratory status improved and she was again extubated on POD #4.  The patient developed diarrhea, C, Diff was negative.  The patient developed neurologic changes with garbled speech and some expressive aphasia. CT scan of the head was obtained and did not show evidence of acute stroke.  It was felt aphasia was likely due to trauma from ET tube.  Neurology consult was obtained and followed the patient.  The patient was maintaining NSR and her pacing wires were removed without difficulty.  The patient continued to progress and was medically stable for transfer to the step down unit.  The patient continues to progress.  She continues to maintain NSR S/P MAZE procedure.  The patient is on coumadin therapy.  Her INR is 2.4 and she is currently on a regimen of 2 mg. She will need to have a PT and INR drawn on Friday  06/01/2013. Results need to be faxed or called to Dr. Harl Bowie. She was diuresed;however, her creatinine went up to 2.34. She does have a history of CKD. Diuresis was stopped for a couple of days. Her creatinine has continued to decrease. Her creatinine is down to 1.48. She has 2++ LE edema and is being diuresed again. She has been weaned down to 1 liter of oxygen via [place]. She is continuing to slowly make progress. Overall, she is still somewhat weak and deconditioned and it was felt SNF placement would be the best discharge option for this patient.    Consults: cardiology, pulmonary/intensive care and neurology  Significant Diagnostic Studies:   Mitral valve: The mitral leaflets were mildly thickened but opened without restriction. The papillary muscles appeared laterally displaced and the zone of coaptation was apically displaced. There was a central jet of mitral regurgitation which was judged as moderate. The vena contract measured 0.45 cm and the regurgitant orifice area was 0.29 cm using the Pisa method. There were no flail or prolapsing mitral segments. There did not appear to be any significant mitral annular calcification or calcification of the sub-valvular apparatus.  2. Aortic valve: The aortic valve appeared bicuspid. There was a raphae between the right and left coronary cusps. The left coronary cusp appeared smaller than the right and non-coronary cusps. There was a jet of aortic regurgitation which originated along the coaptation line. This was judged as mild to moderate.  This was central and not eccentric. The pressure half-time of the deceleration slope was 720 ms.  3. The Left Ventricle: The LV cavity was enlarged. The ventricular end-diastolic diameter measured 60 mm at end diastole at the mid-papillary level in the transgastric short axis view. There was mild to moderate concentric left ventricular hypertrophy. Left ventricular wall thickness measured 10.5-11 mm at the mid-papillary level  in the transgastric short axis view at end-diastole. There was vigorous contractility in all segments interrogated and ejection fraction was estimated at 60-65%. There were no regional wall motion abnormalities flow.  4. Right ventricle: There appeared to be a degree of right ventricular hypertrophy. The right ventricular free wall contracted normally and appeared to be normal right ventricular systolic function.  5. Tricuspid valve: The tricuspid valve appeared structurally unremarkable and there was 1+ tricuspid insufficiency.  6. Interatrial septum: The interatrial septum was intact without evidence of patent foramen ovale or atrial septal defect by color Doppler or bubble study.  7. Left atrium: There was mild left atrial enlargement. The left atrial cavity measured 4.68 cm in the superior inferior dimension and 4.27 cm in the mediolateral dimension. There was no thrombus noted in the left atrium or left atrial appendage.  8. Ascending aorta: The ascending aorta showed a well-defined aortic root and sinotubular ridge without effacement. There was moderate thickening of the intima but no significant atheromatous plaques appreciated.  9. Descending aorta: There were scattered grade 2-3 atheromatous plaques noted within the wall of the descending aorta. The descending aorta was not dilated.  Post-bypass findings:  1. Mitral valve: Upon initial separation cardiopulmonary bypass, there was an annuloplasty ring present in the mitral position. The anterior leaflet appeared to open normally. There was a central jet of mitral regurgitation which was judged as moderate and appeared not significantly changed from the pre-bypass study. Dr. Roxy Manns then elected to return to cardiopulmonary bypass and replace the mitral valve.  Upon separation from cardiopulmonary bypass the second time, there was a bioprosthetic valve in the mitral position. There was some restriction to opening of the leaflet in the area where the  previous anterior leaflet had been. This appeared due to an aortic insufficiency jet. The other leaflets appeared to open normally. There was no mitral insufficiency noted. There was no perivalvular insufficiency noted. The transmitral gradient was 5 mm of mercury using the continuous wave Doppler.  2. Aortic valve: The aortic valve was examined and there was noted to be an increased amount of aortic insufficiency from the pre-bypass study. Again the aortic valve was noted to be bicuspid. There was an additional area of aortic insufficiency which appeared to originate from the commissure between the natural right coronary or coronary cusp and the noncoronary cusp. This resulted in the central jet of aortic insufficiency which contacted the leaflet of the bioprosthetic mitral valve in the area of the previous anterior leaflet. The mitral insufficiency was judged as moderate. The pressure half-time of the aortic insufficiency jet was 369 ms. There was no holodiastolic flow reversal noted in the descending thoracic aorta.  3. Left ventricle: Left ventricular contractility appeared somewhat dyssynchronous due to ventricular pacing but there were no regional wall motion abnormalities appreciated. Ejection fraction was estimated at 55-60%.  4. Right ventricle: The right ventricular cavity showed some decreased contractility of the right ventricular free wall from the pre-bypass exam. There again appeared to be a degree of right ventricular hypertrophy present. There was no septal flattening. RV function was judged as mildly  reduced.  5. Tricuspid valve: The tricuspid valve appeared unchanged from the pre-bypass study and there was 1+ tricuspid insufficiency.  Treatments: surgery:   Minimally-Invasive Mitral Valve Replacement [facility] Mitral bovine bioprosthetic tissue valve (size 21mm, model #7300TFX, serial IC:[id number])  Maze Procedure Complete bilateral atrial lesion set using cryothermy and bipolar  radiofrequency ablation  Oversewing of Left Atrial Appendage  Disposition: SNF  Discharge Medications:    Medication List         acetaminophen 500 MG tablet  Commonly known as:  TYLENOL  Take 500 mg by mouth every 8 (eight) hours as needed for moderate pain.     amiodarone 200 MG tablet  Commonly known as:  PACERONE  Take 1 tablet (200 mg total) by mouth daily.     aspirin EC 81 MG tablet  Take 81 mg by mouth daily.     CALTRATE 600+D PO  Take 1 tablet by mouth daily. Soft chew     furosemide 40 MG tablet  Commonly known as:  LASIX  Take 2 tablets (80 mg total) by mouth 2 (two) times daily.     guaiFENesin 600 MG 12 hr tablet  Commonly known as:  MUCINEX  Take 1 tablet (600 mg total) by mouth every 12 (twelve) hours as needed for cough.     iron polysaccharides 150 MG capsule  Commonly known as:  NIFEREX  Take 1 capsule (150 mg total) by mouth daily. For one month then stop.     metoprolol tartrate 25 MG tablet  Commonly known as:  LOPRESSOR  Take 0.5 tablets (12.5 mg total) by mouth 2 (two) times daily.     potassium chloride SA 20 MEQ tablet  Commonly known as:  K-DUR,KLOR-CON  Take 1 tablet (20 mEq total) by mouth daily.     pravastatin 20 MG tablet  Commonly known as:  PRAVACHOL  Take 20 mg by mouth at bedtime.     solifenacin 5 MG tablet  Commonly known as:  VESICARE  Take 5 mg by mouth daily.     spironolactone 25 MG tablet  Commonly known as:  ALDACTONE  Take 1 tablet (25 mg total) by mouth daily.     traMADol 50 MG tablet  Commonly known as:  ULTRAM  Take 1 tablet (50 mg total) by mouth every 12 (twelve) hours as needed for moderate pain.     warfarin 2 MG tablet  Commonly known as:  COUMADIN  Take 1 tablet (2 mg total) by mouth daily at 6 PM. Or as directed       The patient has been discharged on:   1.Beta Blocker:  Yes [  x ]                              No   [   ]                              If No, reason:  2.Ace Inhibitor/ARB:  Yes [  x ]                                     No  [    ]  If No, reason:  3.Statin:   Yes [x   ]                  No  [   ]                  If No, reason:  4.Ecasa:  Yes  [x   ]                  No   [   ]                  If No, reason:     Discharge Orders   Future Appointments Provider Department Dept Phone   06/04/2013 9:30 AM Rexene Alberts, MD [facility] [place] [phone]   06/08/2013 4:00 PM [name], MD [place] [phone]   Future Orders Complete By Expires   Discharge instructions  As directed    Scheduling Instructions:     1. Please obtain vital signs at least one time daily 2.Please weigh the patient daily. If he or she continues to gain weight or develops lower extremity edema, contact the office at [phone]. 3. Ambulate patient at least three times daily and please use sternal precautions.     Follow-up Information   Follow up with Rexene Alberts, MD On 06/25/2013. (Office will call with appointment time)    Specialty:  Cardiothoracic Surgery   Contact information:   [address] [place] [address] [phone]       Follow up with [facility] On 06/25/2013. (Please obtain PA/LAT CXR one hour prior to office appointment with Dr. Roxy Manns)    Contact information:   [place]       Follow up with [name], MD On 06/08/2013. (Appointment time is at 4:00 pm)    Specialty:  Cardiology   Contact information:   [address] [place] [address] [phone]       Follow up with SNF. (Please draw PT and INR on Friday 06/01/2013 and call or fax results to Dr. Nelly Laurence office)       Signed: Nani Skillern PA-C 05/30/2013, 8:30 AM

## 2013-05-23 NOTE — Progress Notes (Signed)
CARDIAC REHAB PHASE I   PRE:  Rate/Rhythm: 78 afib  BP:  Supine:   Sitting: 97/41  Standing:    SaO2: 96%2L  MODE:  Ambulation: 20 ft inside room   POST:  Rate/Rhythm: 100 afib  BP:  Supine:   Sitting: 106/54  Standing:    SaO2: 94-95% 2L 1010-1055 Pt did poorly with walk. Kept saying she could not move feet and had to talk with them to get them to move. C/o back pain and had to sit at 10 feet(foot of bed). Not motivated. Kept saying I can"t when encouraged to try to go farther. Shoes were put on prior to walk as she requested. Pt had been incontinent of urine when we stood her. Pt cleaned and new gowns put on. Encouraged to use BSC when up but did not need to. Rectal tube intact. Asst x 2 with rollator and oxygen at 2L. Very deconditioned. To recliner with callbell.   Graylon Good, RN BSN  05/23/2013 10:50 AM

## 2013-05-23 NOTE — Progress Notes (Addendum)
[address]       [place] [address]             [phone]     CARDIOTHORACIC SURGERY PROGRESS NOTE  7 Days Post-Op  S/P Procedure(s) (LRB): MINIMALLY INVASIVE MAZE PROCEDURE (N/A) INTRAOPERATIVE TRANSESOPHAGEAL ECHOCARDIOGRAM (N/A) MINIMALLY INVASIVE MITRAL VALVE (MV) REPLACEMENT (Right)  Subjective: No complaints except somewhat emotional about length of time in the hospital, hopeful to get d/c'd to the [facility] in [place].  Denies pain, SOB.  Appetite fairly good.  Objective: Vital signs in last 24 hours: Temp:  [97.7 F (36.5 C)-99.1 F (37.3 C)] 98.3 F (36.8 C) (03/18 0303) Pulse Rate:  [64-99] 69 (03/18 0303) Cardiac Rhythm:  [-] Normal sinus rhythm;Heart block (03/17 1940) Resp:  [18-26] 20 (03/18 0303) BP: (114-163)/(50-96) 127/55 mmHg (03/18 0303) SpO2:  [92 %-99 %] 98 % (03/18 0303) Weight:  [91.9 kg (202 lb 9.6 oz)] 91.9 kg (202 lb 9.6 oz) (03/18 0202)  Physical Exam:  Rhythm:   Afib w/ controlled rate  Breath sounds: Diminished at bases  Heart sounds:  Irregular, no murmur  Incisions:  Healing nicely  Abdomen:  Soft, some diarrhea persists  Extremities:  Warm, well perfused   Intake/Output from previous day: 03/17 0701 - 03/18 0700 In: 470.4 [P.O.:200; I.V.:70.4; IV Piggyback:200] Out: 225 [Urine:50; Stool:175] Intake/Output this shift: Total I/O In: 240 [P.O.:240] Out: -   Lab Results:  Recent Labs  05/21/13 0346 05/22/13 0344  WBC 11.4* 10.0  HGB 8.6* 8.9*  HCT 25.5* 27.0*  PLT 147* 185   BMET:  Recent Labs  05/22/13 0344 05/23/13 0240  NA 143 144  K 3.2* 3.0*  CL 99 97  CO2 28 33*  GLUCOSE 126* 127*  BUN 40* 44*  CREATININE 1.33* 1.31*  CALCIUM 9.0 9.5    CBG (last 3)   Recent Labs  05/20/13 1925 05/20/13 2129 05/21/13 0915  GLUCAP 106* 100* 108*   PT/INR:   Recent Labs  05/23/13 0240  LABPROT 16.1*  INR 1.32    CXR:  CHEST 2 VIEW  COMPARISON: Single view of the chest 05/22/2013 and  05/20/2013.  FINDINGS:  There is airspace disease in the right mid and lower lung zones  which appears worsened. There also appears to be a right pleural  effusion. The left hemidiaphragm is now obscured. Cardiomegaly is  noted. No pneumothorax.  IMPRESSION:  Worsened right greater than left basilar airspace disease and a  right pleural effusion could be due to asymmetric edema or  pneumonia.  Electronically Signed  By: Inge Rise M.D.  On: 05/23/2013 03:49    Assessment/Plan: S/P Procedure(s) (LRB): MINIMALLY INVASIVE MAZE PROCEDURE (N/A) INTRAOPERATIVE TRANSESOPHAGEAL ECHOCARDIOGRAM (N/A) MINIMALLY INVASIVE MITRAL VALVE (MV) REPLACEMENT (Right)  Overall stable POD7   Rate-controlled Afib w/ stable BP Intermittent difficulty w/ speech - ? TIA's vs stroke vs mechanical effects of intubation w/ voice fatigue - seems better today Expected post op acute blood loss anemia, stable Expected post op volume excess, weight trending down, clinically looks like she may be approaching dry weight Hypokalemia - diuretic-induced Diarrhea - reportedly decreased but persistent - C diff negative Generalized weakness, physically debilitated state w/ prolonged hospitalization      Continue metoprolol and ACE-I  Continue amiodarone and coumadin  Supplement K+  Continue PT  Mobilize as much as possible  Low dose lovenox for DVT prophylaxis until INR increases  Ultimately will likely need d/c to [facility], patient prefers [facility]  in [place] 05/23/2013 8:16 AM

## 2013-05-24 DIAGNOSIS — I509 Heart failure, unspecified: Secondary | ICD-10-CM | POA: Diagnosis not present

## 2013-05-24 DIAGNOSIS — I4891 Unspecified atrial fibrillation: Secondary | ICD-10-CM | POA: Diagnosis not present

## 2013-05-24 DIAGNOSIS — Z952 Presence of prosthetic heart valve: Secondary | ICD-10-CM | POA: Diagnosis not present

## 2013-05-24 DIAGNOSIS — I5033 Acute on chronic diastolic (congestive) heart failure: Secondary | ICD-10-CM | POA: Diagnosis not present

## 2013-05-24 LAB — CULTURE, BLOOD (ROUTINE X 2)
CULTURE: NO GROWTH
Culture: NO GROWTH

## 2013-05-24 LAB — CBC
HEMATOCRIT: 29.4 % — AB (ref 36.0–46.0)
HEMOGLOBIN: 9.2 g/dL — AB (ref 12.0–15.0)
MCH: 31.2 pg (ref 26.0–34.0)
MCHC: 31.3 g/dL (ref 30.0–36.0)
MCV: 99.7 fL (ref 78.0–100.0)
Platelets: 255 10*3/uL (ref 150–400)
RBC: 2.95 MIL/uL — ABNORMAL LOW (ref 3.87–5.11)
RDW: 15.7 % — ABNORMAL HIGH (ref 11.5–15.5)
WBC: 10.6 10*3/uL — ABNORMAL HIGH (ref 4.0–10.5)

## 2013-05-24 LAB — BASIC METABOLIC PANEL
BUN: 52 mg/dL — AB (ref 6–23)
CO2: 29 mEq/L (ref 19–32)
Calcium: 9.3 mg/dL (ref 8.4–10.5)
Chloride: 98 mEq/L (ref 96–112)
Creatinine, Ser: 1.82 mg/dL — ABNORMAL HIGH (ref 0.50–1.10)
GFR calc Af Amer: 31 mL/min — ABNORMAL LOW (ref >90)
GFR, EST NON AFRICAN AMERICAN: 27 mL/min — AB (ref >90)
GLUCOSE: 144 mg/dL — AB (ref 70–99)
POTASSIUM: 4.4 meq/L (ref 3.7–5.3)
Sodium: 139 mEq/L (ref 137–147)

## 2013-05-24 LAB — PROTIME-INR
INR: 1.68 — ABNORMAL HIGH (ref 0.00–1.49)
Prothrombin Time: 19.3 seconds — ABNORMAL HIGH (ref 11.6–15.2)

## 2013-05-24 MED ORDER — METOPROLOL TARTRATE 12.5 MG HALF TABLET
12.5000 mg | ORAL_TABLET | Freq: Two times a day (BID) | ORAL | Status: DC
  Filled 2013-05-24 (×2): qty 1

## 2013-05-24 MED ORDER — METOPROLOL TARTRATE 12.5 MG HALF TABLET
12.5000 mg | ORAL_TABLET | Freq: Two times a day (BID) | ORAL | Status: DC
  Administered 2013-05-25 – 2013-05-30 (×9): 12.5 mg via ORAL
  Filled 2013-05-24 (×12): qty 1

## 2013-05-24 MED ORDER — AMIODARONE HCL 200 MG PO TABS
200.0000 mg | ORAL_TABLET | Freq: Every day | ORAL | Status: DC
  Administered 2013-05-24 – 2013-05-28 (×5): 200 mg via ORAL
  Filled 2013-05-24 (×6): qty 1

## 2013-05-24 MED ORDER — LISINOPRIL 5 MG PO TABS
5.0000 mg | ORAL_TABLET | Freq: Every day | ORAL | Status: DC
  Filled 2013-05-24: qty 1

## 2013-05-24 NOTE — Progress Notes (Signed)
CARDIAC REHAB PHASE I   PRE:  Rate/Rhythm: 49 SB    BP: sitting 93/40    SaO2: 83 RA, 100 4L  MODE:  Ambulation: 10 ft outside room   POST:  Rate/Rhythm: 58 SB    BP: sitting 98/78     SaO2: 94 2L  Pt with many c/o. Daughter sts this is not her normal self, that she is very grumpy. Pt trying to motivate herself. Feet/legs are much more swollen today. We could not get her shoes on as we did yesterday. Every step was work for pt. Small steps, back became tired quickly. Made it outside of room and had to sit to rest back. Rested x6-7 min. Then ambulated back to bed (shorter distance). Pt weak, tires easily, back pain and swollen legs are issues. Will continue to follow. Pt promises she will walk x2 more today.  [id number]   Josephina Shih [place] CES, ACSM 05/24/2013 10:39 AM

## 2013-05-24 NOTE — Progress Notes (Signed)
[name], female   DOB: [birth year], 73 y.o.   MRN: [id number]   SUBJECTIVE:s/p minimally invasive MV repair and Maze. Eager to go to SNF. Ambulated in her room 20 ft yesterday. Denies SOB or CP. + orthopnea and wet cough. Back out of fib.  Scheduled Meds: . amiodarone  200 mg Oral BID  . aspirin EC  81 mg Oral Daily  . darifenacin  7.5 mg Oral Daily  . enoxaparin (LOVENOX) injection  30 mg Subcutaneous Q24H  . feeding supplement (GLUCERNA SHAKE)  237 mL Oral BID BM  . furosemide  80 mg Intravenous BID  . iron polysaccharides  150 mg Oral Daily  . lisinopril  10 mg Oral Daily  . metoprolol tartrate  25 mg Oral BID  . potassium chloride SA  40 mEq Oral BID  . simvastatin  10 mg Oral q1800  . sodium chloride  10-40 mL Intracatheter Q12H  . sodium chloride  3 mL Intravenous Q12H  . warfarin  2.5 mg Oral q1800  . Warfarin - Physician Dosing Inpatient   Does not apply q1800   Continuous Infusions:   PRN Meds:.sodium chloride, acetaminophen, guaiFENesin, levalbuterol, ondansetron (ZOFRAN) IV, potassium chloride, sodium chloride, sodium chloride, traMADol    Filed Vitals:   05/23/13 1148 05/23/13 1300 05/23/13 2155 05/24/13 0609  BP:  115/58 138/74 106/52  Pulse: 76 67 66 50  Temp:  98.7 F (37.1 C) 98.1 F (36.7 C) 96.1 F (35.6 C)  TempSrc:  Oral Oral Axillary  Resp:  20  18  Height:      Weight:    200 lb 9.9 oz (91 kg)  SpO2:  99% 97% 100%    Intake/Output Summary (Last 24 hours) at 05/24/13 0710 Last data filed at 05/23/13 1300  Gross per 24 hour  Intake    480 ml  Output      0 ml  Net    480 ml    LABS: Basic Metabolic Panel:  Recent Labs  05/22/13 0344 05/23/13 0240  NA 143 144  K 3.2* 3.0*  CL 99 97  CO2 28 33*  GLUCOSE 126* 127*  BUN 40* 44*  CREATININE 1.33* 1.31*  CALCIUM 9.0 9.5   Liver Function Tests: No results found for this basename: AST, ALT, ALKPHOS, BILITOT, PROT, ALBUMIN,  in the last 72 hours No results found for  this basename: LIPASE, AMYLASE,  in the last 72 hours CBC:  Recent Labs  05/22/13 0344  WBC 10.0  HGB 8.9*  HCT 27.0*  MCV 94.1  PLT 185   Cardiac Enzymes: No results found for this basename: CKTOTAL, CKMB, CKMBINDEX, TROPONINI,  in the last 72 hours BNP: No components found with this basename: POCBNP,  D-Dimer: No results found for this basename: DDIMER,  in the last 72 hours Hemoglobin A1C: No results found for this basename: HGBA1C,  in the last 72 hours Fasting Lipid Panel:  Recent Labs  05/23/13 0240  CHOL 89  HDL 27*  LDLCALC 35  TRIG 134  CHOLHDL 3.3   Thyroid Function Tests: No results found for this basename: TSH, T4TOTAL, FREET3, T3FREE, THYROIDAB,  in the last 72 hours Anemia Panel: No results found for this basename: VITAMINB12, FOLATE, FERRITIN, TIBC, IRON, RETICCTPCT,  in the last 72 hours   PHYSICAL EXAM General: NAD, intubated Neck: JVP 8 cm, no thyromegaly or thyroid nodule.  Lungs: Dependent crackles CV: Nondisplaced PMI.  Heart regular S1/S2, no S3/S4, no murmur.  1+ edema 1/2  to knees bilaterally.   Abdomen: Soft, nontender, no hepatosplenomegaly, no distention.  Neurologic: Intubated/sedated Extremities: No clubbing or cyanosis.   TELEMETRY: Reviewed telemetry pt in SB 40-50s  ASSESSMENT AND PLAN: 73 yo was transferred to [facility] for evaluation of mitral regurgitation for potential repair.  She had acute on chronic diastolic CHF and paroxysmal atrial fibrillation.  She underwent minimally invasive MV repair and Maze.    1. Mitral regurgitation: Severe MR, s/p minimally invasive MV repair and Maze.   2. Acute on chronic diastolic CHF: Patient's volume status improving and Cr elevated. Will place diuretics on hold. Stop lisinopril. Place TED hose. 3. Atrial fibrillation: Converted and is back in SR. Will continue amiodarone and coumadin.   4. CKD:  Labs pending   5. Episodes of expressive aphasia: ?TIA versus small embolic CVAs, only prior CVA on  head CT.  Cannot do MRI at this time (too close to surgery).  Continue warfarin and ASA 81.   Junie Bame B NP-C 05/24/2013 7:10 AM  Patient seen with NP, agree with the above note.  She is in NSR this morning (HR 50s).  Beta blockade has been cut back.  Creatinine up to 1.8 this morning and weight is down.  She is not markedly volume overloaded on exam.  Hold Lasix today.  Will likely need to go back on some po Lasix but will wait until creatinine trends down.  Hold lisinopril for now.  BP soft, no absolute indication for lisinopril with normal EF.   [name] 05/24/2013 9:54 AM

## 2013-05-24 NOTE — Progress Notes (Addendum)
[address]       [place] [address]             [phone]      8 Days Post-Op Procedure(s) (LRB): MINIMALLY INVASIVE MAZE PROCEDURE (N/A) INTRAOPERATIVE TRANSESOPHAGEAL ECHOCARDIOGRAM (N/A) MINIMALLY INVASIVE MITRAL VALVE (MV) REPLACEMENT (Right)  Subjective:  Ms. Sheaffer has several complaints this morning.  Her daughter is at bedside and has several concerns.  The patient has had a headache for the past few days, she is not sleeping, she is not walking, and the patient states she has to get out of here.  Objective: Vital signs in last 24 hours: Temp:  [96.1 F (35.6 C)-98.7 F (37.1 C)] 96.1 F (35.6 C) (03/19 0609) Pulse Rate:  [50-76] 50 (03/19 0609) Cardiac Rhythm:  [-] Sinus bradycardia;Heart block (03/18 2000) Resp:  [18-20] 18 (03/19 0609) BP: (106-138)/(52-74) 106/52 mmHg (03/19 0609) SpO2:  [97 %-100 %] 100 % (03/19 0609) Weight:  [200 lb 9.9 oz (91 kg)] 200 lb 9.9 oz (91 kg) (03/19 0609)  Intake/Output from previous day: 03/18 0701 - 03/19 0700 In: 480 [P.O.:480] Out: -   General appearance: alert, cooperative and no distress Heart: regular rate and rhythm Lungs: clear to auscultation bilaterally Abdomen: soft, non-tender; bowel sounds normal; no masses,  no organomegaly Extremities: edema trace Wound: clean and dry  Lab Results:  Recent Labs  05/22/13 0344 05/24/13 0625  WBC 10.0 10.6*  HGB 8.9* 9.2*  HCT 27.0* 29.4*  PLT 185 255   BMET:  Recent Labs  05/23/13 0240 05/24/13 0625  NA 144 139  K 3.0* 4.4  CL 97 98  CO2 33* 29  GLUCOSE 127* 144*  BUN 44* 52*  CREATININE 1.31* 1.82*  CALCIUM 9.5 9.3    PT/INR:  Recent Labs  05/24/13 0625  LABPROT 19.3*  INR 1.68*   ABG    Component Value Date/Time   PHART 7.413 05/20/2013 0401   HCO3 26.8* 05/20/2013 0401   TCO2 28 05/20/2013 0401   ACIDBASEDEF 2.0 05/17/2013 1626   O2SAT 94.0 05/20/2013 0401   CBG (last 3)   Recent Labs  05/21/13 0915  GLUCAP 108*      Assessment/Plan: S/P Procedure(s) (LRB): MINIMALLY INVASIVE MAZE PROCEDURE (N/A) INTRAOPERATIVE TRANSESOPHAGEAL ECHOCARDIOGRAM (N/A) MINIMALLY INVASIVE MITRAL VALVE (MV) REPLACEMENT (Right)  1. CV- NSR, HR has been a little low- Amiodarone and Lopressor has been decreased by Dr. Roxy Manns this morning 2. Pulm- no acute issues 3. Renal- creatinine elevated this morning at 1.82, Hypokalemia resolved + volume overload- on Lasix, will repeat BMET in AM 4. Neuro- intermittent aphasia, patient speaking clearly this morning 5. Deconditioning- patient not ambulating, instructed that she needs to be ambulating 3x per day to leave hospital 6. Dispo- patient with increase in creatinine, INR increasing continue Coumadin, needs to ambulate, may be ready for SNF tomorrow vs. Monday   LOS: 26 days    BARRETT, ERIN 05/24/2013  I have seen and examined the patient and agree with the assessment and plan as outlined.  Converted out of Afib to sinus brady w/ HR 50's.  BUN/Creatinine continue to rise.  Other than chronic LE edema I think she looks relatively dry and I disagree w/ plans to increase lasix further today.  Will decrease dosages of metoprolol and amiodarone.  Hold lisinopril and lasix today.  Recheck labs tomorrow.  She will not likely be ready for d/c to the [facility] before Monday, and she cannot go until she  is more ambulatory.  OWEN,CLARENCE H 05/24/2013 8:11 AM

## 2013-05-24 NOTE — Progress Notes (Signed)
Pt removed flexiseal while on bedside commode for urination.  Pt felt she needed a BM and felt she had a solid stool.  I explained that she had passed her tube not stool.  Pt had no distress and said she did not need it anyway. Pt resting with call bell within reach.  Will continue to monitor. Payton Emerald, RN

## 2013-05-24 NOTE — Progress Notes (Signed)
CSW updated [facility] of patient's dc possibly Monday. [facility] is able to take patient whenever medically ready. CSW continuing to follow. FL2 on chart for MD signature.  Jeanette Caprice, MSW, [place]

## 2013-05-24 NOTE — Progress Notes (Signed)
Physical Therapy Treatment Patient Details Name: [name] [name] MRN: [id number] DOB: 1940/08/24 Today's Date: 05/24/2013 Time:  -     PT Assessment / Plan / Recommendation  History of Present Illness Patient is a 73 year old obese widowed white female from [place] with remote history of rheumatic fever during childhood and long-standing history of chronic diastolic congestive heart failure who has recently experienced multiple recurrent episodes of acute exacerbation of chronic diastolic congestive heart failure.  She has been diagnosed with severe, symptomatic mitral regurgitation with recurrent paroxysmal atrial fibrillation S/P minimally invasive MVR   PT Comments   Appears tired and a bit irritable today, but participated well.  Will need SNF for rehab.   Follow Up Recommendations  SNF     Does the patient have the potential to tolerate intense rehabilitation     Barriers to Discharge        Equipment Recommendations  Other (comment) (TBA at SNF)    Recommendations for Other Services    Frequency Min 3X/week   Progress towards PT Goals Progress towards PT goals: Progressing toward goals (continue goals)  Plan Current plan remains appropriate    Precautions / Restrictions Precautions Precautions: Fall   Pertinent Vitals/Pain     Mobility  Bed Mobility Overal bed mobility: Needs Assistance Bed Mobility: Supine to Sit Supine to sit: Mod assist General bed mobility comments: Mod assist up to EOB then min to slide further to edge Transfers Overall transfer level: Needs assistance Equipment used: Rolling walker (2 wheeled) Transfers: Sit to/from Stand Sit to Stand: Mod assist General transfer comment: cuing for safety, hand placement Ambulation/Gait Ambulation/Gait assistance: Min assist Ambulation Distance (Feet): 20 Feet (times 2 to bathroom incl sidestepping into confined area) Assistive device: Rolling walker (2 wheeled) Gait Pattern/deviations: Step-to  pattern;Step-through pattern;Decreased step length - right;Decreased step length - left;Wide base of support Gait velocity: decreased General Gait Details: weak unequal steps that become more shuffled gait with fatigue    Exercises     PT Diagnosis:    PT Problem List:   PT Treatment Interventions:     PT Goals (current goals can now be found in the care plan section) Acute Rehab PT Goals PT Goal Formulation: With patient Time For Goal Achievement: 05/29/13 Potential to Achieve Goals: Good  Visit Information  Last PT Received On: 05/24/13 Assistance Needed: +2 (safety) History of Present Illness: Patient is a 73 year old obese widowed white female from [place] with remote history of rheumatic fever during childhood and long-standing history of chronic diastolic congestive heart failure who has recently experienced multiple recurrent episodes of acute exacerbation of chronic diastolic congestive heart failure.  She has been diagnosed with severe, symptomatic mitral regurgitation with recurrent paroxysmal atrial fibrillation S/P minimally invasive MVR    Subjective Data  Subjective: I haven't been up to much good cause I sleepy   Cognition  Cognition Arousal/Alertness: Awake/alert Behavior During Therapy: WFL for tasks assessed/performed Overall Cognitive Status: Within Functional Limits for tasks assessed    Balance  Balance Overall balance assessment: Needs assistance Sitting-balance support: Bilateral upper extremity supported Sitting balance-[name] Scale: Fair Standing balance support: Bilateral upper extremity supported Standing balance-[name] Scale: Poor  End of Session PT - End of Session Equipment Utilized During Treatment: Gait belt;Oxygen Activity Tolerance: Patient tolerated treatment well;Other (comment) (quick to fatigue) Patient left: in chair;with call bell/phone within reach Nurse Communication: Mobility status   GP     Brookelynne Dimperio, Tessie Fass 05/24/2013, 2:38  PM 05/24/2013  Donnella Sham, [place] [phone]  (  pager)

## 2013-05-25 LAB — COMPREHENSIVE METABOLIC PANEL
ALT: 27 U/L (ref 0–35)
AST: 22 U/L (ref 0–37)
Albumin: 2.6 g/dL — ABNORMAL LOW (ref 3.5–5.2)
Alkaline Phosphatase: 112 U/L (ref 39–117)
BILIRUBIN TOTAL: 0.5 mg/dL (ref 0.3–1.2)
BUN: 64 mg/dL — AB (ref 6–23)
CHLORIDE: 95 meq/L — AB (ref 96–112)
CO2: 32 meq/L (ref 19–32)
CREATININE: 2.57 mg/dL — AB (ref 0.50–1.10)
Calcium: 9.5 mg/dL (ref 8.4–10.5)
GFR calc Af Amer: 20 mL/min — ABNORMAL LOW (ref >90)
GFR, EST NON AFRICAN AMERICAN: 18 mL/min — AB (ref >90)
Glucose, Bld: 106 mg/dL — ABNORMAL HIGH (ref 70–99)
Potassium: 5.2 mEq/L (ref 3.7–5.3)
Sodium: 137 mEq/L (ref 137–147)
Total Protein: 5.9 g/dL — ABNORMAL LOW (ref 6.0–8.3)

## 2013-05-25 LAB — PRO B NATRIURETIC PEPTIDE: PRO B NATRI PEPTIDE: 5322 pg/mL — AB (ref 0–125)

## 2013-05-25 LAB — PROTIME-INR
INR: 2.27 — ABNORMAL HIGH (ref 0.00–1.49)
PROTHROMBIN TIME: 24.3 s — AB (ref 11.6–15.2)

## 2013-05-25 LAB — PREALBUMIN: PREALBUMIN: 12.6 mg/dL — AB (ref 17.0–34.0)

## 2013-05-25 MED ORDER — TRAMADOL HCL 50 MG PO TABS
50.0000 mg | ORAL_TABLET | Freq: Two times a day (BID) | ORAL | Status: DC | PRN
  Administered 2013-05-27 – 2013-05-28 (×3): 50 mg via ORAL
  Filled 2013-05-25 (×3): qty 1

## 2013-05-25 MED ORDER — GUAIFENESIN ER 600 MG PO TB12
600.0000 mg | ORAL_TABLET | Freq: Two times a day (BID) | ORAL | Status: DC
  Administered 2013-05-25 – 2013-05-30 (×11): 600 mg via ORAL
  Filled 2013-05-25 (×12): qty 1

## 2013-05-25 NOTE — Progress Notes (Addendum)
      [address]       [place] [address]             [phone]        9 Days Post-Op Procedure(s) (LRB): MINIMALLY INVASIVE MAZE PROCEDURE (N/A) INTRAOPERATIVE TRANSESOPHAGEAL ECHOCARDIOGRAM (N/A) MINIMALLY INVASIVE MITRAL VALVE (MV) REPLACEMENT (Right)  Subjective: Patient having difficulty expectorating sputum. Has complaints of shortness of breath, worse with exertion and is not urinating much.  Objective: Vital signs in last 24 hours: Temp:  [97.5 F (36.4 C)-98.3 F (36.8 C)] 97.5 F (36.4 C) (03/20 0521) Pulse Rate:  [50-80] 52 (03/20 0521) Cardiac Rhythm:  [-] Sinus bradycardia;Heart block (03/19 0836) Resp:  [18] 18 (03/20 0521) BP: (105-123)/(49-67) 123/49 mmHg (03/20 0521) SpO2:  [100 %] 100 % (03/20 0521) Weight:  [202 lb 13.2 oz (92 kg)] 202 lb 13.2 oz (92 kg) (03/20 0521)  Pre op weight 83 kg Current Weight  05/25/13 202 lb 13.2 oz (92 kg)       Intake/Output from previous day: 03/19 0701 - 03/20 0700 In: -  Out: 700 [Urine:700]   Physical Exam:  Cardiovascular: RRR, no murmurs, gallops, or rubs. Pulmonary: Some crackles R>L; no rales, wheezes, or rhonchi. Abdomen: Soft, non tender, bowel sounds present. Extremities: Bilateral lower extremity edema. Wounds: Clean and dry.  No erythema or signs of infection. Neuro: Grossly intact without focal deficits  Lab Results: CBC: Recent Labs  05/24/13 0625  WBC 10.6*  HGB 9.2*  HCT 29.4*  PLT 255   BMET:  Recent Labs  05/24/13 0625 05/25/13 0402  NA 139 137  K 4.4 5.2  CL 98 95*  CO2 29 32  GLUCOSE 144* 106*  BUN 52* 64*  CREATININE 1.82* 2.57*  CALCIUM 9.3 9.5    PT/INR:  Lab Results  Component Value Date   INR 2.27* 05/25/2013   INR 1.68* 05/24/2013   INR 1.32 05/23/2013   ABG:  INR: Will add last result for INR, ABG once components are confirmed Will add last 4 CBG results once components are confirmed  Assessment/Plan:  1. CV - Previous a fib. SB/SR.  On Amiodarone 200 daily, Lopressor 12.5 bid, Lisinopril 5 daily, and Coumadin. INR increased from 1.68 to 2.27. Stop Lovenox. 2.  Pulmonary - On 2 liters of oxygen via [place].Will wean as tolerates.Encourage incentive spirometer 3. Encourage ambulation 4.  Acute blood loss anemia - Last H and H 9.2 and 29.4. Continue Niferex. 5. History of CKD. Creatinine increased from 1.82 to 2.57. Will stop ACE and diuretics stopped yesterday. May need nephrology consult. On Enablex for OAB. 6.Neuro-has had intermittent aphasia previously 7. Mucinex for cough  ZIMMERMAN,DONIELLE MPA-C 05/25/2013,7:42 AM   I have seen and examined the patient and agree with the assessment and plan as outlined.  Powell Halbert H 05/25/2013 8:09 AM

## 2013-05-25 NOTE — Progress Notes (Signed)
Patient has attempted to void on multiple occasions with no success.  According to daughter at bedside, patient has only consumed a cup of orange juice and a can of gingerale since her last void.  Scanned bladder several times by two different RNs and greatest volume found in bladder was 145ml.  Will continue to monitor.  [name] [name] Patient

## 2013-05-25 NOTE — Progress Notes (Signed)
CARDIAC REHAB PHASE I   PRE:  Rate/Rhythm: 66 SB  BP:  Supine:   Sitting: 118/43  Standing:    SaO2: 100%2L  MODE:  Ambulation: 100 ft   POST:  Rate/Rhythm: 67  BP:  Supine:   Sitting: 126/55  Standing:    SaO2: 88%2L to 95% with rest 1330-1430 Pt was incontinent of stool in depends. Encouraged pt to use BSC. She stated she had company and did not have time. Discussed that we did not want pt to get UTI so discussed trying to use BSC. Pt cleaned prior to walk. Pt walked 100 ft on 2L with rolling walker and asst x 2. Sat in rollator seat 4 times to rest. Stated back hurting and needed to rest back. Encouraged pt to stand upright, stay close to walker and take bigger steps. To bed after walk. Had desat slightly after walk. Needs to walk with staff, encouraged pt to do this.   Graylon Good, RN BSN  05/25/2013 2:26 PM

## 2013-05-26 ENCOUNTER — Inpatient Hospital Stay (HOSPITAL_COMMUNITY): Payer: Medicare HMO

## 2013-05-26 LAB — BASIC METABOLIC PANEL
BUN: 74 mg/dL — AB (ref 6–23)
CHLORIDE: 94 meq/L — AB (ref 96–112)
CO2: 31 mEq/L (ref 19–32)
Calcium: 9.3 mg/dL (ref 8.4–10.5)
Creatinine, Ser: 2.39 mg/dL — ABNORMAL HIGH (ref 0.50–1.10)
GFR calc Af Amer: 22 mL/min — ABNORMAL LOW (ref >90)
GFR calc non Af Amer: 19 mL/min — ABNORMAL LOW (ref >90)
GLUCOSE: 128 mg/dL — AB (ref 70–99)
Potassium: 4.7 mEq/L (ref 3.7–5.3)
Sodium: 136 mEq/L — ABNORMAL LOW (ref 137–147)

## 2013-05-26 LAB — PROTIME-INR
INR: 2.62 — ABNORMAL HIGH (ref 0.00–1.49)
Prothrombin Time: 27.1 seconds — ABNORMAL HIGH (ref 11.6–15.2)

## 2013-05-26 LAB — CLOSTRIDIUM DIFFICILE BY PCR: Toxigenic C. Difficile by PCR: NEGATIVE

## 2013-05-26 NOTE — Progress Notes (Addendum)
      [address]       [place] [address]             [phone]      10 Days Post-Op Procedure(s) (LRB): MINIMALLY INVASIVE MAZE PROCEDURE (N/A) INTRAOPERATIVE TRANSESOPHAGEAL ECHOCARDIOGRAM (N/A) MINIMALLY INVASIVE MITRAL VALVE (MV) REPLACEMENT (Right)  Subjective:  Ms. [name] [name] states she feels better this morning.  She denies any further urinary difficulties and states she is urinating regularly.  She denies dysuria and hematuria.  She does now have stool incontinence.  She states she had this previously, and it resolved.  However she states it started yesterday and its just "running" out.  Patient also instructed she needed to ambulate 3 times today.  Objective: Vital signs in last 24 hours: Temp:  [97.8 F (36.6 C)-98.2 F (36.8 C)] 97.8 F (36.6 C) (03/21 0357) Pulse Rate:  [56-64] 56 (03/21 0357) Cardiac Rhythm:  [-] Sinus bradycardia;Heart block;Normal sinus rhythm (03/20 2045) Resp:  [18] 18 (03/21 0357) BP: (111-127)/(52-73) 111/68 mmHg (03/21 0357) SpO2:  [99 %-100 %] 99 % (03/21 0357) Weight:  [198 lb 9.6 oz (90.084 kg)] 198 lb 9.6 oz (90.084 kg) (03/21 0642)  Intake/Output from previous day: 03/20 0701 - 03/21 0700 In: 480 [P.O.:480] Out: 301 [Urine:300; Stool:1]  General appearance: alert, cooperative and no distress Heart: regular rate and rhythm Lungs: clear to auscultation bilaterally Abdomen: soft, non-tender; bowel sounds normal; no masses,  no organomegaly Extremities: edema bilateral Wound: clean and dry  Lab Results:  Recent Labs  05/24/13 0625  WBC 10.6*  HGB 9.2*  HCT 29.4*  PLT 255   BMET:  Recent Labs  05/24/13 0625 05/25/13 0402  NA 139 137  K 4.4 5.2  CL 98 95*  CO2 29 32  GLUCOSE 144* 106*  BUN 52* 64*  CREATININE 1.82* 2.57*  CALCIUM 9.3 9.5    PT/INR:  Recent Labs  05/26/13 0755  LABPROT 27.1*  INR 2.62*   ABG    Component Value Date/Time   PHART 7.413 05/20/2013 0401   HCO3 26.8* 05/20/2013  0401   TCO2 28 05/20/2013 0401   ACIDBASEDEF 2.0 05/17/2013 1626   O2SAT 94.0 05/20/2013 0401   CBG (last 3)  No results found for this basename: GLUCAP,  in the last 72 hours  Assessment/Plan: S/P Procedure(s) (LRB): MINIMALLY INVASIVE MAZE PROCEDURE (N/A) INTRAOPERATIVE TRANSESOPHAGEAL ECHOCARDIOGRAM (N/A) MINIMALLY INVASIVE MITRAL VALVE (MV) REPLACEMENT (Right)  1. CV- Previous A. Fib, currently Sinus [name] [name]- on Amiodarone and Lopressor which are at lowest dose, may need to discontinue one of these medications should bradycardia continue 2. Pulm- off oxygen, small bilateral effusions, bibasilar opacities 3. Renal- creatinine elevated, repeat BMET ordered, remains volume overloaded, Lasix on hold for now 4. GI- diarrhea, off all stool softeners, will recheck C. Diff 5. INR 2.62, continue Coumadin 6. Deconditioning- patient requires a lot of encouragement to get out of bed and ambulate, will need SNF at discharge 7. Dispo- BMET ordered, continue Coumadin,   LOS: 28 days    BARRETT, ERIN 05/26/2013  I have seen and examined the patient and agree with the assessment and plan as outlined.  In good spirits today.  Await BMET panel results.  Continue to hold lasix for now.  OWEN,CLARENCE H 05/26/2013 11:36 AM

## 2013-05-26 NOTE — Progress Notes (Signed)
CARDIAC REHAB PHASE I   PRE:  Rate/Rhythm: 66 sinus  BP:  Sitting: 135/64   SaO2: 100 2.5 L  MODE:  Ambulation: 120 ft   POST:  Rate/Rhythem: 63 sinus  BP:  Sitting: 120/50   SaO2: 100 2.5 L  Pt ambulated using rollator with assist x2  Pt went 120 ft with four seated rest breaks.  Pt needs lots of encouragement to walk further each time.  She had no c/o other than fatigue.  We will f/u on Monday. Alberteen Sam, [place], ACSM RCEP [phone]

## 2013-05-26 NOTE — Progress Notes (Signed)
[name], female   DOB: [birth year], 73 y.o.   MRN: [id number]   SUBJECTIVE: Denies dyspnea or chest pain Scheduled Meds: . amiodarone  200 mg Oral Daily  . aspirin EC  81 mg Oral Daily  . darifenacin  7.5 mg Oral Daily  . feeding supplement (GLUCERNA SHAKE)  237 mL Oral BID BM  . guaiFENesin  600 mg Oral BID  . iron polysaccharides  150 mg Oral Daily  . metoprolol tartrate  12.5 mg Oral BID  . simvastatin  10 mg Oral q1800  . sodium chloride  10-40 mL Intracatheter Q12H  . sodium chloride  3 mL Intravenous Q12H  . warfarin  2.5 mg Oral q1800  . Warfarin - Physician Dosing Inpatient   Does not apply q1800   Continuous Infusions:   PRN Meds:.sodium chloride, acetaminophen, guaiFENesin, levalbuterol, ondansetron (ZOFRAN) IV, sodium chloride, sodium chloride, traMADol    Filed Vitals:   05/25/13 1939 05/26/13 0357 05/26/13 0642 05/26/13 1000  BP: 118/73 111/68  103/50  Pulse: 60 56  58  Temp: 98.2 F (36.8 C) 97.8 F (36.6 C)    TempSrc: Oral Oral    Resp: 18 18    Height:      Weight:   198 lb 9.6 oz (90.084 kg)   SpO2: 99% 99%      Intake/Output Summary (Last 24 hours) at 05/26/13 1303 Last data filed at 05/26/13 1011  Gross per 24 hour  Intake    240 ml  Output      0 ml  Net    240 ml    LABS: Basic Metabolic Panel:  Recent Labs  05/25/13 0402 05/26/13 1155  NA 137 136*  K 5.2 4.7  CL 95* 94*  CO2 32 31  GLUCOSE 106* 128*  BUN 64* 74*  CREATININE 2.57* 2.39*  CALCIUM 9.5 9.3   Liver Function Tests:  Recent Labs  05/25/13 0402  AST 22  ALT 27  ALKPHOS 112  BILITOT 0.5  PROT 5.9*  ALBUMIN 2.6*   CBC:  Recent Labs  05/24/13 0625  WBC 10.6*  HGB 9.2*  HCT 29.4*  MCV 99.7  PLT 255     PHYSICAL EXAM General: NAD Heent: normal Neck: Supple Lungs: CTA CV: RRR  Abdomen: Soft, nontender, not distended Neurologic: nonfocal Extremities: 1+ edema  TELEMETRY: Reviewed telemetry pt in SB 40-50s  ASSESSMENT AND  PLAN: 73 yo was transferred to [facility] for evaluation of mitral regurgitation for potential repair.  She had acute on chronic diastolic CHF and paroxysmal atrial fibrillation.  She underwent minimally invasive MV repair and Maze.    1. Mitral regurgitation: Severe MR, s/p minimally invasive MV repair and Maze.   2. Acute on chronic diastolic CHF: Patient's volume status better and renal function worse; continue to hold diuretics. 3. Atrial fibrillation: Remains in SR. Will continue amiodarone and coumadin.   4. CKD:  Follow renal function 5. Diarrhea: C diff pending  Kirk Ruths 05/26/2013 1:03 PM

## 2013-05-26 NOTE — Progress Notes (Signed)
Pt ambulated in hallway 75 ft with 3 sitting breaks on 1 liter of oxygen. Pt assisted back to chair. Will continue to monitor.

## 2013-05-27 DIAGNOSIS — I5033 Acute on chronic diastolic (congestive) heart failure: Secondary | ICD-10-CM | POA: Diagnosis not present

## 2013-05-27 DIAGNOSIS — I509 Heart failure, unspecified: Secondary | ICD-10-CM | POA: Diagnosis not present

## 2013-05-27 LAB — BASIC METABOLIC PANEL
BUN: 75 mg/dL — ABNORMAL HIGH (ref 6–23)
CHLORIDE: 97 meq/L (ref 96–112)
CO2: 27 mEq/L (ref 19–32)
Calcium: 9.2 mg/dL (ref 8.4–10.5)
Creatinine, Ser: 2.06 mg/dL — ABNORMAL HIGH (ref 0.50–1.10)
GFR calc Af Amer: 27 mL/min — ABNORMAL LOW (ref >90)
GFR, EST NON AFRICAN AMERICAN: 23 mL/min — AB (ref >90)
GLUCOSE: 99 mg/dL (ref 70–99)
POTASSIUM: 4.6 meq/L (ref 3.7–5.3)
SODIUM: 139 meq/L (ref 137–147)

## 2013-05-27 LAB — PROTIME-INR
INR: 2.9 — ABNORMAL HIGH (ref 0.00–1.49)
Prothrombin Time: 29.3 seconds — ABNORMAL HIGH (ref 11.6–15.2)

## 2013-05-27 MED ORDER — FUROSEMIDE 10 MG/ML IJ SOLN
40.0000 mg | Freq: Once | INTRAMUSCULAR | Status: AC
  Administered 2013-05-27: 40 mg via INTRAVENOUS
  Filled 2013-05-27: qty 4

## 2013-05-27 NOTE — Progress Notes (Signed)
Pt refusing to walk at this time. Pt states she is hurting. Pt medicated. Will try again later. Will continue to monitor.

## 2013-05-27 NOTE — Progress Notes (Addendum)
      [address]       [place] [address]             [phone]      11 Days Post-Op Procedure(s) (LRB): MINIMALLY INVASIVE MAZE PROCEDURE (N/A) INTRAOPERATIVE TRANSESOPHAGEAL ECHOCARDIOGRAM (N/A) MINIMALLY INVASIVE MITRAL VALVE (MV) REPLACEMENT (Right)  Subjective:  [name] [name] had a good day yesterday. But she states she didn't do well overnight.  She states she was having difficulty coughing and when she did cough it caused her chest to hurt.  She states she is still sore and pain medication has not yet provided relief.  She did ambulate with assistance and her diarrhea has improved.   Objective: Vital signs in last 24 hours: Temp:  [97.9 F (36.6 C)-98.6 F (37 C)] 97.9 F (36.6 C) (03/22 0330) Pulse Rate:  [58-73] 59 (03/22 0330) Cardiac Rhythm:  [-] Normal sinus rhythm;Heart block (03/21 2030) Resp:  [16-18] 18 (03/22 0330) BP: (103-128)/(44-65) 128/65 mmHg (03/22 0330) SpO2:  [93 %-97 %] 93 % (03/22 0330) Weight:  [201 lb 15.1 oz (91.6 kg)] 201 lb 15.1 oz (91.6 kg) (03/22 0345)  Intake/Output from previous day: 03/21 0701 - 03/22 0700 In: 480 [P.O.:480] Out: -   General appearance: alert, cooperative and no distress Heart: regular rate and rhythm Lungs: clear to auscultation bilaterally Abdomen: soft, non-tender; bowel sounds normal; no masses,  no organomegaly Extremities: edema 1-2+ Wound: clean and dry  Lab Results: No results found for this basename: WBC, HGB, HCT, PLT,  in the last 72 hours BMET:  Recent Labs  05/26/13 1155 05/27/13 0545  NA 136* 139  K 4.7 4.6  CL 94* 97  CO2 31 27  GLUCOSE 128* 99  BUN 74* 75*  CREATININE 2.39* 2.06*  CALCIUM 9.3 9.2    PT/INR:  Recent Labs  05/27/13 0545  LABPROT 29.3*  INR 2.90*   ABG    Component Value Date/Time   PHART 7.413 05/20/2013 0401   HCO3 26.8* 05/20/2013 0401   TCO2 28 05/20/2013 0401   ACIDBASEDEF 2.0 05/17/2013 1626   O2SAT 94.0 05/20/2013 0401   CBG (last 3)  No  results found for this basename: GLUCAP,  in the last 72 hours  Assessment/Plan: S/P Procedure(s) (LRB): MINIMALLY INVASIVE MAZE PROCEDURE (N/A) INTRAOPERATIVE TRANSESOPHAGEAL ECHOCARDIOGRAM (N/A) MINIMALLY INVASIVE MITRAL VALVE (MV) REPLACEMENT (Right)  1. CV- Sinus [name] [name]- on Amiodarone and Lopressor- maintaining HR at 59-73- will continue meds for now 2. Pulm- on oxygen wean as tolerated, encouraged use of IS 3. Renal- creatinine trending back down at 2.06, remains volume overloaded with LE edema will hold Lasix for now 4. INR 2.92- will hold coumadin tonight 5. GI- diarrhea resolved, C. Diff negative 6. Deconditioning- patients ambulating efforts have improved, will need SNF at discharge 7. Dispo- patient's renal function improving, hold coumadin tonight, continue ambulation, hopefully to SNF soon  LOS: 29 days    [name], [name] 05/27/2013  I have seen and examined the patient and agree with the assessment and plan as outlined.  Will give one dose lasix today as creatinine trending down, weight trending up and edema appears worse.  OWEN,CLARENCE H 05/27/2013 11:15 AM

## 2013-05-27 NOTE — Progress Notes (Signed)
Pt refuses to walk at this time. Will try again later. Will continue to monitor.  

## 2013-05-27 NOTE — Progress Notes (Signed)
[name], female   DOB: Jul 13, 1940, 73 y.o.   MRN: [id number]   SUBJECTIVE: Denies dyspnea or chest pain Scheduled Meds: . amiodarone  200 mg Oral Daily  . aspirin EC  81 mg Oral Daily  . darifenacin  7.5 mg Oral Daily  . feeding supplement (GLUCERNA SHAKE)  237 mL Oral BID BM  . guaiFENesin  600 mg Oral BID  . iron polysaccharides  150 mg Oral Daily  . metoprolol tartrate  12.5 mg Oral BID  . simvastatin  10 mg Oral q1800  . sodium chloride  10-40 mL Intracatheter Q12H  . sodium chloride  3 mL Intravenous Q12H  . Warfarin - Physician Dosing Inpatient   Does not apply q1800   Continuous Infusions:   PRN Meds:.sodium chloride, acetaminophen, guaiFENesin, levalbuterol, ondansetron (ZOFRAN) IV, sodium chloride, sodium chloride, traMADol    Filed Vitals:   05/26/13 2123 05/27/13 0330 05/27/13 0345 05/27/13 1023  BP: 118/44 128/65  117/50  Pulse: 73 59  61  Temp: 98.1 F (36.7 C) 97.9 F (36.6 C)    TempSrc: Oral Oral    Resp: 18 18    Height:      Weight:   201 lb 15.1 oz (91.6 kg)   SpO2: 95% 93%      Intake/Output Summary (Last 24 hours) at 05/27/13 1047 Last data filed at 05/27/13 0946  Gross per 24 hour  Intake    480 ml  Output      0 ml  Net    480 ml    LABS: Basic Metabolic Panel:  Recent Labs  05/26/13 1155 05/27/13 0545  NA 136* 139  K 4.7 4.6  CL 94* 97  CO2 31 27  GLUCOSE 128* 99  BUN 74* 75*  CREATININE 2.39* 2.06*  CALCIUM 9.3 9.2   Liver Function Tests:  Recent Labs  05/25/13 0402  AST 22  ALT 27  ALKPHOS 112  BILITOT 0.5  PROT 5.9*  ALBUMIN 2.6*     PHYSICAL EXAM General: NAD Heent: normal Neck: Supple Lungs: CTA CV: RRR  Abdomen: Soft, nontender, not distended Neurologic: nonfocal Extremities: 2+ edema  TELEMETRY: Reviewed telemetry pt in SB 40-50s  ASSESSMENT AND PLAN: 73 yo was transferred to [facility] for evaluation of mitral regurgitation for potential repair.  She had acute on chronic diastolic  CHF and paroxysmal atrial fibrillation.  She underwent minimally invasive MV repair and Maze.    1. Mitral regurgitation: Severe MR, s/p minimally invasive MV repair and Maze.   2. Acute on chronic diastolic CHF: Patient's volume status is worse today with worsening pedal edema. BUN and Cr 75/2.06; would favor holding diuretics today and resuming lower dose in AM. 3. Atrial fibrillation: Remains in SR. Will continue amiodarone and coumadin.   4. CKD:  Follow renal function   Kirk Ruths 05/27/2013 10:47 AM

## 2013-05-28 LAB — CBC
HCT: 27 % — ABNORMAL LOW (ref 36.0–46.0)
HEMOGLOBIN: 8.7 g/dL — AB (ref 12.0–15.0)
MCH: 32.2 pg (ref 26.0–34.0)
MCHC: 32.2 g/dL (ref 30.0–36.0)
MCV: 100 fL (ref 78.0–100.0)
Platelets: 245 10*3/uL (ref 150–400)
RBC: 2.7 MIL/uL — ABNORMAL LOW (ref 3.87–5.11)
RDW: 17.6 % — ABNORMAL HIGH (ref 11.5–15.5)
WBC: 8.3 10*3/uL (ref 4.0–10.5)

## 2013-05-28 LAB — COMPREHENSIVE METABOLIC PANEL
ALT: 32 U/L (ref 0–35)
AST: 27 U/L (ref 0–37)
Albumin: 2.5 g/dL — ABNORMAL LOW (ref 3.5–5.2)
Alkaline Phosphatase: 115 U/L (ref 39–117)
BUN: 67 mg/dL — ABNORMAL HIGH (ref 6–23)
CALCIUM: 9.1 mg/dL (ref 8.4–10.5)
CO2: 34 meq/L — AB (ref 19–32)
Chloride: 100 mEq/L (ref 96–112)
Creatinine, Ser: 1.76 mg/dL — ABNORMAL HIGH (ref 0.50–1.10)
GFR calc Af Amer: 32 mL/min — ABNORMAL LOW (ref >90)
GFR, EST NON AFRICAN AMERICAN: 28 mL/min — AB (ref >90)
Glucose, Bld: 110 mg/dL — ABNORMAL HIGH (ref 70–99)
POTASSIUM: 4.8 meq/L (ref 3.7–5.3)
SODIUM: 142 meq/L (ref 137–147)
Total Bilirubin: 0.3 mg/dL (ref 0.3–1.2)
Total Protein: 5.7 g/dL — ABNORMAL LOW (ref 6.0–8.3)

## 2013-05-28 LAB — PROTIME-INR
INR: 2.5 — AB (ref 0.00–1.49)
Prothrombin Time: 26.2 seconds — ABNORMAL HIGH (ref 11.6–15.2)

## 2013-05-28 LAB — PRO B NATRIURETIC PEPTIDE: Pro B Natriuretic peptide (BNP): 3515 pg/mL — ABNORMAL HIGH (ref 0–125)

## 2013-05-28 MED ORDER — WARFARIN SODIUM 2 MG PO TABS
2.0000 mg | ORAL_TABLET | Freq: Every day | ORAL | Status: DC
  Administered 2013-05-28 – 2013-05-29 (×2): 2 mg via ORAL
  Filled 2013-05-28 (×3): qty 1

## 2013-05-28 MED ORDER — FUROSEMIDE 80 MG PO TABS
80.0000 mg | ORAL_TABLET | Freq: Two times a day (BID) | ORAL | Status: DC
  Administered 2013-05-28 – 2013-05-29 (×3): 80 mg via ORAL
  Filled 2013-05-28 (×5): qty 1

## 2013-05-28 MED ORDER — POTASSIUM CHLORIDE CRYS ER 10 MEQ PO TBCR
10.0000 meq | EXTENDED_RELEASE_TABLET | Freq: Two times a day (BID) | ORAL | Status: DC
  Administered 2013-05-28 – 2013-05-30 (×5): 10 meq via ORAL
  Filled 2013-05-28 (×6): qty 1

## 2013-05-28 NOTE — Progress Notes (Addendum)
      [address]       [place] [address]             [phone]        12 Days Post-Op Procedure(s) (LRB): MINIMALLY INVASIVE MAZE PROCEDURE (N/A) INTRAOPERATIVE TRANSESOPHAGEAL ECHOCARDIOGRAM (N/A) MINIMALLY INVASIVE MITRAL VALVE (MV) REPLACEMENT (Right)  Subjective: Patient eating breakfast. States she is feeling better this morning. Having less diarrhea incisional pain.  Objective: Vital signs in last 24 hours: Temp:  [97.6 F (36.4 C)-98.3 F (36.8 C)] 97.6 F (36.4 C) (03/23 0602) Pulse Rate:  [60-96] 88 (03/23 0602) Cardiac Rhythm:  [-] Normal sinus rhythm;Heart block (03/22 2030) Resp:  [16-17] 16 (03/23 0602) BP: (96-137)/(42-73) 100/65 mmHg (03/23 0602) SpO2:  [96 %-98 %] 98 % (03/23 0602) Weight:  [202 lb 1.6 oz (91.672 kg)] 202 lb 1.6 oz (91.672 kg) (03/23 0134)  Pre op weight 83 kg Current Weight  05/28/13 202 lb 1.6 oz (91.672 kg)       Intake/Output from previous day: 03/22 0701 - 03/23 0700 In: 480 [P.O.:480] Out: -    Physical Exam:  Cardiovascular: IRRR, IRRR;no murmurs, gallops, or rubs. Pulmonary: Diminished at bases; no rales, wheezes, or rhonchi. Abdomen: Soft, non tender, bowel sounds present. Extremities: +++ bilateral lower extremity edema. Wounds: Clean and dry.  No erythema or signs of infection. Neuro: Grossly intact without focal deficits  Lab Results: CBC:  Recent Labs  05/28/13 0528  WBC 8.3  HGB 8.7*  HCT 27.0*  PLT 245   BMET:   Recent Labs  05/27/13 0545 05/28/13 0528  NA 139 142  K 4.6 4.8  CL 97 100  CO2 27 34*  GLUCOSE 99 110*  BUN 75* 67*  CREATININE 2.06* 1.76*  CALCIUM 9.2 9.1    PT/INR:  Lab Results  Component Value Date   INR 2.50* 05/28/2013   INR 2.90* 05/27/2013   INR 2.62* 05/26/2013   ABG:  INR: Will add last result for INR, ABG once components are confirmed Will add last 4 CBG results once components are confirmed  Assessment/Plan:  1. CV -  A fib with CVR this  am.SBP in low 100s this am.On Amiodarone 200 daily, Lopressor 12.5 bid,  and Coumadin. INR decreased from 2.9 to 2.5. 2.  Pulmonary - On 1 liters of oxygen via [place].Weaning as tolerates.Encourage incentive spirometer 3. Encourage ambulation 4.  Acute blood loss anemia - Last H and H 8.7 and 27. Continue Niferex. 5. History of CKD. Creatinine decreased from 2.06 to 1.76.  6. Hopefully to SNF soon  ZIMMERMAN,DONIELLE MPA-C 05/28/2013,7:37 AM   I have seen and examined the patient and agree with the assessment and plan as outlined.  Making slow but steady progress.  Possibly ready for d/c to the [facility] in [place] by Wednesday.  Restart oral lasix. Coumadin 2 mg/day for now.  Will need f/u appointment scheduled w/ Dr Harl Bowie w/in 2 weeks of discharge.  OWEN,CLARENCE H 05/28/2013 8:43 AM

## 2013-05-28 NOTE — Progress Notes (Signed)
Patient had 34 beat run of VTach while at rest. Asymptomatic. Vitals taken. MD notified. Labs ordered. Will continue to monitor. Call bell within reach.  Domingo Dimes RN

## 2013-05-28 NOTE — Progress Notes (Signed)
CARDIAC REHAB PHASE I   PRE:  Rate/Rhythm: 94 afib  BP:  Supine: 110/70  Sitting:   Standing:    SaO2: 97%1L  MODE:  Ambulation: 150 ft   POST:  Rate/Rhythm: 123 afib  BP:  Supine:   Sitting: 120/70  Standing:    SaO2: 88%RA hall, 96-98% 2L 9166-0600 Tried pt on RA during walk but desat. Pt able to walk 150 ft with rolling walker and asst x 2 on 2L. Encouraged pt to walk farther before sitting. Pt able to walk 150 ft with only two sit down rest periods. Followed with rollator so pt could rest. Pt very proud of herself. Emotional support and encouragement given. To recliner after walk. Daughter in room. PT to see today.   Graylon Good, RN BSN  05/28/2013 9:02 AM

## 2013-05-28 NOTE — Progress Notes (Signed)
CSW updated patient and family by bedside that [facility] has a private room for patient when medically stable. Patient appeared happy with this information. CSW continuing to follow.  Jeanette Caprice, MSW, [place]

## 2013-05-28 NOTE — Progress Notes (Signed)
NUTRITION FOLLOW UP  INTERVENTION: Continue Glucerna Shake po BID, each supplement provides 220 kcal and 10 grams of protein RD to follow for nutrition care plan  NUTRITION DIAGNOSIS: Inadequate oral intake, improved  Goal: Pt to meet >/= 90% of their estimated nutrition needs, progressing  Monitor:  PO & supplemental intake, weight, labs, I/O's  ASSESSMENT: Patient admitted with shortness of breath; s/p cardiac cath 2/27; + severe mitral regurgitation; decision made to proceed with surgical intervention.  Patient s/p procedures 3/12: MINIMALLY INVASIVE MAZE PROCEDURE  INTRAOPERATIVE TRANSESOPHAGEAL ECHOCARDIOGRAM  MINIMALLY INVASIVE MITRAL VALVE REPLACEMENT   Patient extubated 3/14.    Patient transferred from 2S-Surgical to 2W-Cardiac 3/17.  Patient reports her appetite is fairly good.  She states she is consuming approximately 50% of her meals.  Is drinking her Glucerna Shake supplements.  Will keep orders in place.  Having less diarrhea and incisional pain.  Height: Ht Readings from Last 1 Encounters:  05/16/13 5\' 2"  (1.575 m)    Weight ----> stable Wt Readings from Last 1 Encounters:  05/28/13 202 lb 1.6 oz (91.672 kg)    03/22  201 lb 03/21  198 lb 03/18  202 lb 03/14  219 lb 03/13  218 lb 03/12  219 lb 03/11  184 lb 03/10  185 lb 03/09  186 lb  BMI:  Body mass index is 36.96 kg/(m^2).  Re-estimated needs: Kcal: 1500-1700 Protein: 100-110 gm Fluid: per MD  Skin: Stage I pressure ulcer to buttocks, surgical incisions   Diet Order: Full Liquids   Intake/Output Summary (Last 24 hours) at 05/28/13 1105 Last data filed at 05/28/13 0700  Gross per 24 hour  Intake    480 ml  Output      0 ml  Net    480 ml    Labs:   Recent Labs Lab 05/26/13 1155 05/27/13 0545 05/28/13 0528  NA 136* 139 142  K 4.7 4.6 4.8  CL 94* 97 100  CO2 31 27 34*  BUN 74* 75* 67*  CREATININE 2.39* 2.06* 1.76*  CALCIUM 9.3 9.2 9.1  GLUCOSE 128* 99 110*     Scheduled Meds: . amiodarone  200 mg Oral Daily  . aspirin EC  81 mg Oral Daily  . darifenacin  7.5 mg Oral Daily  . feeding supplement (GLUCERNA SHAKE)  237 mL Oral BID BM  . furosemide  80 mg Oral BID  . guaiFENesin  600 mg Oral BID  . iron polysaccharides  150 mg Oral Daily  . metoprolol tartrate  12.5 mg Oral BID  . potassium chloride  10 mEq Oral BID  . simvastatin  10 mg Oral q1800  . sodium chloride  10-40 mL Intracatheter Q12H  . sodium chloride  3 mL Intravenous Q12H  . warfarin  2 mg Oral q1800  . Warfarin - Physician Dosing Inpatient   Does not apply q1800    Continuous Infusions:    Past Medical History  Diagnosis Date  . Chronic airway obstruction, not elsewhere classified   . Precordial pain   . Swelling of limb   . Dizziness and giddiness   . Unspecified transient cerebral ischemia     Diag. 2003  . Acute, but ill-defined, cerebrovascular disease     2000 [place]  . Shortness of breath   . Obesity   . Juvenile rheumatic fever     age 28  . Unspecified essential hypertension   . Hyperlipidemia   . Mitral regurgitation 02/16/2013  . Chronic kidney disease   .  CHF (congestive heart failure) 04/23/2013  . Paroxysmal atrial fibrillation 02/16/2013    Recurrent paroxysmal atrial fibrillation, first diagnosed December 2014  . Chronic diastolic congestive heart failure   . Arthritis     Chronic left knee pain  . Aortic insufficiency due to bicuspid aortic valve     moderate by TEE  . 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 S/P Maze operation for atrial fibrillation 05/16/2013    Complete bilateral atrial lesion set using cryothermy and bipolar radiofrequency ablation with oversewing of LA appendage    Past Surgical History  Procedure Laterality Date  . Total knee arthroplasty Right   . Abdominal  hysterectomy      Cervical Cancer  . Parathyroid/thyroid surgery      tumor  . Tee without cardioversion N/A 04/06/2013    Procedure: TRANSESOPHAGEAL ECHOCARDIOGRAM (TEE);  Surgeon: [name], MD;  Location: [facility];  Service: Cardiology;  Laterality: N/A;  . Minimally invasive maze procedure N/A 05/16/2013    Procedure: MINIMALLY INVASIVE MAZE PROCEDURE;  Surgeon: Rexene Alberts, MD;  Location: [facility];  Service: Open Heart Surgery;  Laterality: N/A;  . Intraoperative transesophageal echocardiogram N/A 05/16/2013    Procedure: INTRAOPERATIVE TRANSESOPHAGEAL ECHOCARDIOGRAM;  Surgeon: Rexene Alberts, MD;  Location: [place] [facility] [place];  Service: Open Heart Surgery;  Laterality: N/A;  . Mitral valve replacement Right 05/16/2013    Procedure: MINIMALLY INVASIVE MITRAL VALVE (MV) REPLACEMENT;  Surgeon: Rexene Alberts, MD;  Location: [facility];  Service: Open Heart Surgery;  Laterality: Right;    Arthur Holms, RD, LDN Pager #: [phone] After-Hours Pager #: [phone]

## 2013-05-28 NOTE — Progress Notes (Signed)
Physical Therapy Treatment Patient Details Name: SANJA ELIZARDO MRN: [id number] DOB: 1940/08/17 Today's Date: 05/28/2013 Time: 4270-6237 PT Time Calculation (min): 43 min  PT Assessment / Plan / Recommendation  History of Present Illness Patient is a 73 year old obese widowed white female from [place] with remote history of rheumatic fever during childhood and long-standing history of chronic diastolic congestive heart failure who has recently experienced multiple recurrent episodes of acute exacerbation of chronic diastolic congestive heart failure.  She has been diagnosed with severe, symptomatic mitral regurgitation with recurrent paroxysmal atrial fibrillation S/P minimally invasive MVR   PT Comments   Pt progressing slowly towards physical therapy goals. Very slow with ambulation, but appears motivated to meet the expectations set by clinical staff regarding distance. Will continue to follow.   Follow Up Recommendations  SNF     Does the patient have the potential to tolerate intense rehabilitation     Barriers to Discharge        Equipment Recommendations  None recommended by PT    Recommendations for Other Services    Frequency Min 3X/week   Progress towards PT Goals Progress towards PT goals: Progressing toward goals  Plan Current plan remains appropriate    Precautions / Restrictions Precautions Precautions: Fall Precaution Comments: watch HR/O2 sats if on RA Restrictions Weight Bearing Restrictions: No   Pertinent Vitals/Pain See gait training details.     Mobility  Bed Mobility General bed mobility comments: Pt received sitting up in recliner.  Transfers Overall transfer level: Needs assistance Equipment used: Rolling walker (2 wheeled) Transfers: Sit to/from Stand Sit to Stand: Mod assist General transfer comment: VC's for hand placement and to not pull up on walker.  Ambulation/Gait Ambulation/Gait assistance: Min guard Ambulation Distance (Feet): 115  Feet Assistive device: Rolling walker (2 wheeled) Gait Pattern/deviations: Step-through pattern;Decreased stride length;Trunk flexed Gait velocity: Very slow Gait velocity interpretation: Below normal speed for age/gender General Gait Details: Pt's gait pattern becomes more shuffled with fatigue. Several rest breaks required. on RA to start, and O2 sats decreased to 86% after 25'. Supplemental O2 donned at 1L/min and saturations immediately improved to 93%. O2 remained >94% throughout the rest of the session on supplemental O2.     Exercises     PT Diagnosis:    PT Problem List:   PT Treatment Interventions:     PT Goals (current goals can now be found in the care plan section) Acute Rehab PT Goals Patient Stated Goal: Get back home PT Goal Formulation: With patient Time For Goal Achievement: 05/29/13 Potential to Achieve Goals: Good  Visit Information  Last PT Received On: 05/28/13 Assistance Needed: +2 (chair follow) History of Present Illness: Patient is a 73 year old obese widowed white female from [place] with remote history of rheumatic fever during childhood and long-standing history of chronic diastolic congestive heart failure who has recently experienced multiple recurrent episodes of acute exacerbation of chronic diastolic congestive heart failure.  She has been diagnosed with severe, symptomatic mitral regurgitation with recurrent paroxysmal atrial fibrillation S/P minimally invasive MVR    Subjective Data  Subjective: "I am doing good, right?" Patient Stated Goal: Get back home   Cognition  Cognition Arousal/Alertness: Awake/alert Behavior During Therapy: WFL for tasks assessed/performed Overall Cognitive Status: Within Functional Limits for tasks assessed    Balance  Balance Overall balance assessment: Needs assistance Sitting-balance support: Feet supported Sitting balance-[name] Scale: Fair Standing balance support: Bilateral upper extremity supported Standing  balance-[name] Scale: Poor  End of Session PT - End  of Session Equipment Utilized During Treatment: Gait belt;Oxygen Activity Tolerance: Patient tolerated treatment well Patient left: in chair;with call bell/phone within reach Nurse Communication: Mobility status   GP     Jolyn Lent 05/28/2013, 4:51 PM  Jolyn Lent, PT, DPT Acute Rehabilitation Services Pager: [phone]

## 2013-05-29 DIAGNOSIS — I5033 Acute on chronic diastolic (congestive) heart failure: Secondary | ICD-10-CM | POA: Diagnosis not present

## 2013-05-29 DIAGNOSIS — I059 Rheumatic mitral valve disease, unspecified: Secondary | ICD-10-CM | POA: Diagnosis not present

## 2013-05-29 DIAGNOSIS — I509 Heart failure, unspecified: Secondary | ICD-10-CM | POA: Diagnosis not present

## 2013-05-29 DIAGNOSIS — I4891 Unspecified atrial fibrillation: Secondary | ICD-10-CM | POA: Diagnosis not present

## 2013-05-29 LAB — BASIC METABOLIC PANEL
BUN: 58 mg/dL — AB (ref 6–23)
BUN: 56 mg/dL — ABNORMAL HIGH (ref 6–23)
CO2: 30 meq/L (ref 19–32)
CO2: 30 mEq/L (ref 19–32)
Calcium: 9 mg/dL (ref 8.4–10.5)
Calcium: 8.9 mg/dL (ref 8.4–10.5)
Chloride: 100 mEq/L (ref 96–112)
Chloride: 98 mEq/L (ref 96–112)
Creatinine, Ser: 1.52 mg/dL — ABNORMAL HIGH (ref 0.50–1.10)
Creatinine, Ser: 1.46 mg/dL — ABNORMAL HIGH (ref 0.50–1.10)
GFR calc Af Amer: 38 mL/min — ABNORMAL LOW (ref >90)
GFR calc Af Amer: 40 mL/min — ABNORMAL LOW (ref >90)
GFR calc non Af Amer: 33 mL/min — ABNORMAL LOW (ref >90)
GFR calc non Af Amer: 35 mL/min — ABNORMAL LOW (ref >90)
GLUCOSE: 121 mg/dL — AB (ref 70–99)
GLUCOSE: 184 mg/dL — AB (ref 70–99)
POTASSIUM: 4.6 meq/L (ref 3.7–5.3)
POTASSIUM: 4.3 meq/L (ref 3.7–5.3)
Sodium: 140 mEq/L (ref 137–147)
Sodium: 140 mEq/L (ref 137–147)

## 2013-05-29 LAB — PROTIME-INR
INR: 2.34 — ABNORMAL HIGH (ref 0.00–1.49)
Prothrombin Time: 24.9 seconds — ABNORMAL HIGH (ref 11.6–15.2)

## 2013-05-29 LAB — MAGNESIUM: Magnesium: 2 mg/dL (ref 1.5–2.5)

## 2013-05-29 MED ORDER — AMIODARONE HCL 200 MG PO TABS
400.0000 mg | ORAL_TABLET | Freq: Two times a day (BID) | ORAL | Status: DC
  Administered 2013-05-29 – 2013-05-30 (×2): 400 mg via ORAL
  Filled 2013-05-29 (×4): qty 2

## 2013-05-29 MED ORDER — FUROSEMIDE 80 MG PO TABS
80.0000 mg | ORAL_TABLET | Freq: Two times a day (BID) | ORAL | Status: DC
  Administered 2013-05-30: 80 mg via ORAL
  Filled 2013-05-29 (×3): qty 1

## 2013-05-29 MED ORDER — FUROSEMIDE 10 MG/ML IJ SOLN
40.0000 mg | Freq: Four times a day (QID) | INTRAMUSCULAR | Status: AC
  Administered 2013-05-29 (×2): 40 mg via INTRAVENOUS
  Filled 2013-05-29 (×2): qty 4

## 2013-05-29 MED ORDER — AMIODARONE IV BOLUS ONLY 150 MG/100ML
150.0000 mg | Freq: Once | INTRAVENOUS | Status: AC
  Administered 2013-05-29: 150 mg via INTRAVENOUS
  Filled 2013-05-29 (×2): qty 100

## 2013-05-29 MED ORDER — FUROSEMIDE 80 MG PO TABS
80.0000 mg | ORAL_TABLET | Freq: Two times a day (BID) | ORAL | Status: DC
  Filled 2013-05-29 (×2): qty 1

## 2013-05-29 NOTE — Progress Notes (Deleted)
      [address]       [place] [address]             [phone]        13 Days Post-Op Procedure(s) (LRB): MINIMALLY INVASIVE MAZE PROCEDURE (N/A) INTRAOPERATIVE TRANSESOPHAGEAL ECHOCARDIOGRAM (N/A) MINIMALLY INVASIVE MITRAL VALVE (MV) REPLACEMENT (Right)  Subjective: Patient looking forward to being discharged in am.  Objective: Vital signs in last 24 hours: Temp:  [97.9 F (36.6 C)-98.2 F (36.8 C)] 97.9 F (36.6 C) (03/24 0413) Pulse Rate:  [94-112] 108 (03/24 0413) Cardiac Rhythm:  [-] Atrial fibrillation (03/24 0742) Resp:  [16-17] 17 (03/24 0413) BP: (95-114)/(57-67) 107/67 mmHg (03/24 0413) SpO2:  [95 %-98 %] 97 % (03/24 0413) Weight:  [205 lb 11 oz (93.3 kg)] 205 lb 11 oz (93.3 kg) (03/24 0413)  Pre op weight 83 kg Current Weight  05/29/13 205 lb 11 oz (93.3 kg)       Intake/Output from previous day: 03/23 0701 - 03/24 0700 In: 610 [P.O.:600; I.V.:10] Out: 350 [Urine:350]   Physical Exam:  Cardiovascular: IRRR, IRRR;no murmurs, gallops, or rubs. Pulmonary: Diminished at bases; no rales, wheezes, or rhonchi. Abdomen: Soft, non tender, bowel sounds present. Extremities: +++ bilateral lower extremity edema. Wounds: Clean and dry.  No erythema or signs of infection. Neuro: Grossly intact without focal deficits  Lab Results: CBC:  Recent Labs  05/28/13 0528  WBC 8.3  HGB 8.7*  HCT 27.0*  PLT 245   BMET:   Recent Labs  05/28/13 2327 05/29/13 0600  NA 140 140  K 4.6 4.3  CL 100 98  CO2 30 30  GLUCOSE 121* 184*  BUN 58* 56*  CREATININE 1.52* 1.46*  CALCIUM 9.0 8.9    PT/INR:  Lab Results  Component Value Date   INR 2.34* 05/29/2013   INR 2.50* 05/28/2013   INR 2.90* 05/27/2013   ABG:  INR: Will add last result for INR, ABG once components are confirmed Will add last 4 CBG results once components are confirmed  Assessment/Plan:  1. CV -  Run of ?VT last evening. A fib with CVR this am..On Amiodarone 200  daily, Lopressor 12.5 bid,  and Coumadin. INR decreased from 2.5 to 2.34. 2.  Pulmonary - On 1 liters of oxygen via [place].Weaning as tolerates.Encourage incentive spirometer 3. Volume overload-On lasix 40 IV Q 6 4.  Acute blood loss anemia - Last H and H 8.7 and 27. Continue Niferex. 5. History of CKD. Creatinine decreased to 1.46  6. To SNF in am  Zyla Dascenzo MPA-C 05/29/2013,8:22 AM

## 2013-05-29 NOTE — Progress Notes (Signed)
Patient SaO2 87-90% on RA. Placed back on 1L of oxygen. SaO2 94-98% on 1L. Will continue to monitor.   Domingo Dimes RN

## 2013-05-29 NOTE — Discharge Instructions (Signed)
1. Please obtain vital signs at least one time daily 2.Please weigh the patient daily. If he or she continues to gain weight or develops lower extremity edema, contact the office at [phone]. 3. Ambulate patient at least three times daily and please use sternal precautions.   Activity: 1.May walk up steps                2.No lifting more than ten pounds for four weeks.                 3.No driving for four weeks.                4.Stop any activity that causes chest pain, shortness of breath, dizziness, sweating or excessive weakness.                5.Avoid straining.                6.Continue with your breathing exercises daily.  Diet: Low fat, Low salt diet  Wound Care: May shower.  Clean wounds with mild soap and water daily. Contact the office at [phone] if any problems arise.         Mitral Valve Replacement, Care After  Refer to this sheet in the next few weeks. These instructions provide you with information on caring for yourself after your procedure. Your health care provider may also give you specific instructions. Your treatment has been planned according to current medical practices, but problems sometimes occur. Call your health care provider if you have any problems or questions after your procedure.  HOME CARE INSTRUCTIONS   Only take over-the-counter or prescription medicines as directed by your health care provider.  Take your temperature every morning for the first 7 days after surgery. Write these down.  Weigh yourself every morning for at least 7 days after surgery. Write your weight down.  Wear elastic stockings during the day for at least 2 weeks after surgery. Use them longer if your ankles are swollen. The stockings help blood flow and help reduce swelling in the legs.  Take frequent naps or rest often throughout the day.  Avoid lifting more than 10 lb (4.5 kg) or pushing or pulling things with your arms for 6 8 weeks or as directed by your  health care provider.  Avoid driving or airplane travel for 4 6 weeks after surgery or as directed. If you are riding in a car for an extended period, stop every 1 2 hours to stretch your legs.  Avoid crossing your legs.  Avoid climbing stairs and using the handrail to pull yourself up for the first 2 3 weeks after surgery.  Do not take baths for 2 4 weeks after surgery. Take showers once your health care provider approves. Pat incisions dry. Do not rub incisions with a washcloth or towel.  Return to work as directed by your health care provider.  Drink enough fluids to keep your urine clear or pale yellow.  Do not strain to have a bowel movement. Eat high-fiber foods if you become constipated. You may also take a medicine to help you have a bowel movement (laxative) as directed by your health care provider.  Resume sexual activity as directed by your health care provider. SEEK MEDICAL CARE IF:   You develop a skin rash.   Your weight is increasing each day over 2 3 days.  Your weight increases by 2 or more pounds (1 kg) in a single day. [place]  CARE IF:   You develop chest pain that is not coming from your incision.  You develop shortness of breath or difficulty breathing.  You have a fever.  You have increased drainage from your wound.  You see redness, swelling, or have increasing pain in the wound.  You have pus coming from your wound.  You develop lightheadedness. MAKE SURE YOU:  Understand these directions.  Will watch your condition.  Will get help right away if you are not doing well or get worse. Document Released: 09/11/2004 Document Revised: 10/25/2012 Document Reviewed: 07/25/2012 [facility] Patient Information 2014 [place].

## 2013-05-29 NOTE — Progress Notes (Signed)
Pt c/o feeling very tired and weak at this time; VSS; pt not wanting to ambulate at this time; pt moaning at times and just states she feels tired and weak; will cont. To monitor.

## 2013-05-29 NOTE — Progress Notes (Signed)
[name], female   DOB: 03-06-1941, 73 y.o.   MRN: [id number]   SUBJECTIVE:s/p minimally invasive MV repair and Maze.   Diuresing with IV lasix. Weight up 3 pounds in last 24 hours.   Scheduled Meds: . amiodarone  200 mg Oral Daily  . aspirin EC  81 mg Oral Daily  . darifenacin  7.5 mg Oral Daily  . feeding supplement (GLUCERNA SHAKE)  237 mL Oral BID BM  . furosemide  40 mg Intravenous Q6H  . [START ON 05/30/2013] furosemide  80 mg Oral BID  . guaiFENesin  600 mg Oral BID  . iron polysaccharides  150 mg Oral Daily  . metoprolol tartrate  12.5 mg Oral BID  . potassium chloride  10 mEq Oral BID  . simvastatin  10 mg Oral q1800  . sodium chloride  10-40 mL Intracatheter Q12H  . sodium chloride  3 mL Intravenous Q12H  . warfarin  2 mg Oral q1800  . Warfarin - Physician Dosing Inpatient   Does not apply q1800   Continuous Infusions:   PRN Meds:.sodium chloride, acetaminophen, guaiFENesin, levalbuterol, ondansetron (ZOFRAN) IV, sodium chloride, sodium chloride, traMADol    Filed Vitals:   05/28/13 1350 05/28/13 1942 05/28/13 2117 05/29/13 0413  BP: 95/57 114/66 105/65 107/67  Pulse: 109 112 94 108  Temp: 97.9 F (36.6 C) 98.2 F (36.8 C)  97.9 F (36.6 C)  TempSrc: Oral Oral  Oral  Resp: 16 17 16 17   Height:      Weight:    205 lb 11 oz (93.3 kg)  SpO2: 95% 96% 98% 97%    Intake/Output Summary (Last 24 hours) at 05/29/13 0941 Last data filed at 05/29/13 0418  Gross per 24 hour  Intake    610 ml  Output    350 ml  Net    260 ml    LABS: Basic Metabolic Panel:  Recent Labs  05/28/13 2327 05/29/13 0600  NA 140 140  K 4.6 4.3  CL 100 98  CO2 30 30  GLUCOSE 121* 184*  BUN 58* 56*  CREATININE 1.52* 1.46*  CALCIUM 9.0 8.9  MG 2.0  --    Liver Function Tests:  Recent Labs  05/28/13 0528  AST 27  ALT 32  ALKPHOS 115  BILITOT 0.3  PROT 5.7*  ALBUMIN 2.5*   No results found for this basename: LIPASE, AMYLASE,  in the last 72  hours CBC:  Recent Labs  05/28/13 0528  WBC 8.3  HGB 8.7*  HCT 27.0*  MCV 100.0  PLT 245   Cardiac Enzymes: No results found for this basename: CKTOTAL, CKMB, CKMBINDEX, TROPONINI,  in the last 72 hours BNP: No components found with this basename: POCBNP,  D-Dimer: No results found for this basename: DDIMER,  in the last 72 hours Hemoglobin A1C: No results found for this basename: HGBA1C,  in the last 72 hours Fasting Lipid Panel: No results found for this basename: CHOL, HDL, LDLCALC, TRIG, CHOLHDL, LDLDIRECT,  in the last 72 hours Thyroid Function Tests: No results found for this basename: TSH, T4TOTAL, FREET3, T3FREE, THYROIDAB,  in the last 72 hours Anemia Panel: No results found for this basename: VITAMINB12, FOLATE, FERRITIN, TIBC, IRON, RETICCTPCT,  in the last 72 hours   PHYSICAL EXAM General: NAD, Sitting in recliner Neck: JVP 9-10 cm, no thyromegaly or thyroid nodule.  Lungs:LLL crackles.  CV: Nondisplaced PMI.  Heart regular S1/S2, no S3/S4, no murmur.  1+ edema 1/2 to knees bilaterally.  Abdomen: Obese, Soft, nontender, no hepatosplenomegaly, no distention.  Neurologic: Intubated/sedated Extremities: No clubbing or cyanosis. R and LLE 3+ edema   TELEMETRY:   ASSESSMENT AND PLAN: 73 yo was transferred to [facility] for evaluation of mitral regurgitation for potential repair.  She had acute on chronic diastolic CHF and paroxysmal atrial fibrillation.  She underwent minimally invasive MV repair and Maze.    1. Mitral regurgitation: Severe MR, s/p minimally invasive MV repair and Maze.   2. Acute on chronic diastolic CHF: Volume status elevated. Continue IV lasix. Renal function improved. Keep off lisinopril.  Place TED hose. 3. Atrial fibrillation:  Back in A fib. Give 150 mg amiodarone bolus and increase amiodarone to 400 mg bid. INR therapeutic.    4. CKD:  Renal function improved Creatinine back down to 1.46 5. Episodes of expressive aphasia: ?TIA versus small  embolic CVAs, only prior CVA on head CT.  Cannot do MRI at this time (too close to surgery).  Continue warfarin and ASA 81.  6. NSVT--magnesium 2.0.  As above increase amiodarone.    CLEGG,AMY NP-C 05/29/2013 9:41 AM  Patient seen with NP, agree with the above note.    1. Acute on chronic diastolic CHF: Patient is volume overloaded, agree with IV Lasix today.  If she diureses well, to po again tomorrow.  2. Atrial fibrillation: Paroxysmal.  She is back in atrial fibrillation this morning.  Will bolus amiodarone and increase po dose.  Hopefully will convert.  INR therapeutic on warfarin.  3. AKI on CKD: Creatinine improved.  4. NSVT: As above, increasing amiodarone.   [name] 05/29/2013 10:05 AM

## 2013-05-29 NOTE — Progress Notes (Signed)
CARDIAC REHAB PHASE I   PRE:  Rate/Rhythm: 104 afib    BP: sitting 112/48    SaO2: 97 1L  MODE:  Ambulation: 150 ft   POST:  Rate/Rhythm: 127 afib    BP: sitting 83/60     SaO2: 96 2L  Pt continues to need increased motivation. Takes very slow steps. Took 1 hr for entire walk. Sat x2 for rest in first 75 ft then able to walk last 75 ft without sitting (with encouragement). Sts she is exhausted after walk. Used assist x2 with RW, 2L O2.  1100-1158   Josephina Shih Miller CES, ACSM 05/29/2013 11:55 AM

## 2013-05-29 NOTE — Progress Notes (Addendum)
Update: CSW received insurance authorization today. Auth number is # [id number].   CSW continuing to follow for dc to [facility] possibly tomorrow. CSW will contact insurance company today.  Jeanette Caprice, MSW, [place]

## 2013-05-29 NOTE — Progress Notes (Addendum)
      [address]       [place] [address]             [phone]        13 Days Post-Op Procedure(s) (LRB): MINIMALLY INVASIVE MAZE PROCEDURE (N/A) INTRAOPERATIVE TRANSESOPHAGEAL ECHOCARDIOGRAM (N/A) MINIMALLY INVASIVE MITRAL VALVE (MV) REPLACEMENT (Right)  Subjective: Patient looking forward to being discharged in am.  Objective: Vital signs in last 24 hours: Temp:  [97.9 F (36.6 C)-98.2 F (36.8 C)] 97.9 F (36.6 C) (03/24 0413) Pulse Rate:  [94-112] 108 (03/24 0413) Cardiac Rhythm:  [-] Atrial fibrillation (03/24 0742) Resp:  [16-17] 17 (03/24 0413) BP: (95-114)/(57-67) 107/67 mmHg (03/24 0413) SpO2:  [95 %-98 %] 97 % (03/24 0413) Weight:  [205 lb 11 oz (93.3 kg)] 205 lb 11 oz (93.3 kg) (03/24 0413)  Pre op weight 83 kg Current Weight  05/29/13 205 lb 11 oz (93.3 kg)       Intake/Output from previous day: 03/23 0701 - 03/24 0700 In: 610 [P.O.:600; I.V.:10] Out: 350 [Urine:350]   Physical Exam:  Cardiovascular: IRRR, IRRR;no murmurs, gallops, or rubs. Pulmonary: Diminished at bases; no rales, wheezes, or rhonchi. Abdomen: Soft, non tender, bowel sounds present. Extremities: +++ bilateral lower extremity edema. Wounds: Clean and dry.  No erythema or signs of infection. Neuro: Grossly intact without focal deficits  Lab Results: CBC:  Recent Labs  05/28/13 0528  WBC 8.3  HGB 8.7*  HCT 27.0*  PLT 245   BMET:   Recent Labs  05/28/13 2327 05/29/13 0600  NA 140 140  K 4.6 4.3  CL 100 98  CO2 30 30  GLUCOSE 121* 184*  BUN 58* 56*  CREATININE 1.52* 1.46*  CALCIUM 9.0 8.9    PT/INR:  Lab Results  Component Value Date   INR 2.34* 05/29/2013   INR 2.50* 05/28/2013   INR 2.90* 05/27/2013   ABG:  INR: Will add last result for INR, ABG once components are confirmed Will add last 4 CBG results once components are confirmed  Assessment/Plan:  1. CV -  Run of ?VT last evening. A fib with CVR this am..On Amiodarone 200  daily, Lopressor 12.5 bid,  and Coumadin. INR decreased from 2.5 to 2.34. 2.  Pulmonary - On 1 liters of oxygen via [place].Weaning as tolerates.Encourage incentive spirometer 3. Volume overload-On lasix 80 bid 4.  Acute blood loss anemia - Last H and H 8.7 and 27. Continue Niferex. 5. History of CKD. Creatinine decreased to 1.46  6. To SNF in am  ZIMMERMAN,DONIELLE MPA-C 05/29/2013,8:15 AM     I have seen and examined the patient and agree with the assessment and plan as outlined.  OWEN,CLARENCE H 05/29/2013 8:20 AM

## 2013-05-29 NOTE — Progress Notes (Signed)
Speech Language Pathology Discharge Patient Details Name: [name] [name] MRN: [id number] DOB: 12-26-1940 Today's Date: 05/29/2013 Time:  -     Patient discharged from [facility]; will need f/u services at [facility].  Juan Quam Laurice 05/29/2013, 3:14 PM

## 2013-05-30 ENCOUNTER — Other Ambulatory Visit: Payer: Self-pay | Admitting: *Deleted

## 2013-05-30 DIAGNOSIS — I059 Rheumatic mitral valve disease, unspecified: Secondary | ICD-10-CM

## 2013-05-30 LAB — BASIC METABOLIC PANEL
BUN: 50 mg/dL — ABNORMAL HIGH (ref 6–23)
CALCIUM: 8.9 mg/dL (ref 8.4–10.5)
CO2: 33 meq/L — AB (ref 19–32)
CREATININE: 1.48 mg/dL — AB (ref 0.50–1.10)
Chloride: 99 mEq/L (ref 96–112)
GFR calc Af Amer: 40 mL/min — ABNORMAL LOW (ref >90)
GFR, EST NON AFRICAN AMERICAN: 34 mL/min — AB (ref >90)
GLUCOSE: 103 mg/dL — AB (ref 70–99)
Potassium: 4.7 mEq/L (ref 3.7–5.3)
SODIUM: 141 meq/L (ref 137–147)

## 2013-05-30 LAB — MAGNESIUM: Magnesium: 1.9 mg/dL (ref 1.5–2.5)

## 2013-05-30 LAB — PROTIME-INR
INR: 2.4 — ABNORMAL HIGH (ref 0.00–1.49)
Prothrombin Time: 25.4 seconds — ABNORMAL HIGH (ref 11.6–15.2)

## 2013-05-30 MED ORDER — WARFARIN SODIUM 2 MG PO TABS: 2.0000 mg | ORAL_TABLET | Freq: Every day | ORAL | Status: DC

## 2013-05-30 MED ORDER — METOPROLOL TARTRATE 25 MG PO TABS: 12.5000 mg | ORAL_TABLET | Freq: Two times a day (BID) | ORAL | Status: DC

## 2013-05-30 MED ORDER — SPIRONOLACTONE 25 MG PO TABS: 25.0000 mg | ORAL_TABLET | Freq: Every day | ORAL | Status: DC

## 2013-05-30 MED ORDER — FUROSEMIDE 40 MG PO TABS: 80.0000 mg | ORAL_TABLET | Freq: Two times a day (BID) | ORAL | Status: DC

## 2013-05-30 MED ORDER — TRAMADOL HCL 50 MG PO TABS: 50.0000 mg | ORAL_TABLET | Freq: Two times a day (BID) | ORAL | Status: DC | PRN

## 2013-05-30 MED ORDER — SPIRONOLACTONE 25 MG PO TABS
25.0000 mg | ORAL_TABLET | Freq: Every day | ORAL | Status: DC
  Filled 2013-05-30: qty 1

## 2013-05-30 MED ORDER — GUAIFENESIN ER 600 MG PO TB12: 600.0000 mg | ORAL_TABLET | Freq: Two times a day (BID) | ORAL | Status: DC | PRN

## 2013-05-30 MED ORDER — AMIODARONE HCL 200 MG PO TABS: 200.0000 mg | ORAL_TABLET | Freq: Every day | ORAL | Status: DC

## 2013-05-30 MED ORDER — POLYSACCHARIDE IRON COMPLEX 150 MG PO CAPS: 150.0000 mg | ORAL_CAPSULE | Freq: Every day | ORAL | Status: DC

## 2013-05-30 MED ORDER — POTASSIUM CHLORIDE CRYS ER 20 MEQ PO TBCR: 20.0000 meq | EXTENDED_RELEASE_TABLET | Freq: Every day | ORAL | Status: DC

## 2013-05-30 NOTE — Progress Notes (Signed)
Clinical Social Work Department CLINICAL SOCIAL WORK PLACEMENT NOTE 05/30/2013  Patient:  [name] [name], [name] [name]  Account Number:  [id number] Admit date:  04/28/2013  Clinical Social Worker:  Ky Barban, Latanya Presser  Date/time:  05/09/2013 11:22 AM  Clinical Social Work is seeking post-discharge placement for this patient at the following level of care:   SKILLED NURSING   (*CSW will update this form in Epic as items are completed)     Patient/family provided with [place] Department of Clinical Social Work's list of facilities offering this level of care within the geographic area requested by the patient (or if unable, by the patient's family).  05/09/2013  Patient/family informed of their freedom to choose among providers that offer the needed level of care, that participate in Medicare, Medicaid or managed care program needed by the patient, have an available bed and are willing to accept the patient.    Patient/family informed of [facility]' ownership interest in [facility], as well as of the fact that they are under no obligation to receive care at this facility.  PASARR submitted to EDS on 05/09/2013 PASARR number received from EDS on 05/09/2013  FL2 transmitted to all facilities in geographic area requested by pt/family on  05/09/2013 FL2 transmitted to all facilities within larger geographic area on   Patient informed that his/her managed care company has contracts with or will negotiate with  certain facilities, including the following:     Patient/family informed of bed offers received:  05/28/2013 Patient chooses bed at [facility] Physician recommends and patient chooses bed at    Patient to be transferred to [facility] on  05/30/2013 Patient to be transferred to facility by FAMILY  The following physician request were entered in Epic:   Additional Comments:  Jeanette Caprice, MSW, [place]

## 2013-05-30 NOTE — Progress Notes (Signed)
Clinical Social Worker facilitated patient discharge by contacting the patient, family and [place]. Patient agreeable to this plan and arranging transport via family. . CSW will sign off, as social work intervention is no longer needed.  Jeanette Caprice, MSW, [place]

## 2013-05-30 NOTE — Progress Notes (Addendum)
      [address]       [place] [address]             [phone]        14 Days Post-Op Procedure(s) (LRB): MINIMALLY INVASIVE MAZE PROCEDURE (N/A) INTRAOPERATIVE TRANSESOPHAGEAL ECHOCARDIOGRAM (N/A) MINIMALLY INVASIVE MITRAL VALVE (MV) REPLACEMENT (Right)  Subjective: Patient is ready to go to SNF  Objective: Vital signs in last 24 hours: Temp:  [98.3 F (36.8 C)-99.2 F (37.3 C)] 99.2 F (37.3 C) (03/24 2056) Pulse Rate:  [81-116] 112 (03/24 2236) Cardiac Rhythm:  [-] Atrial fibrillation (03/24 2108) BP: (111-132)/(48-73) 132/73 mmHg (03/24 2236) SpO2:  [96 %-98 %] 96 % (03/24 2056) Weight:  [207 lb 7.3 oz (94.1 kg)] 207 lb 7.3 oz (94.1 kg) (03/25 0500)  Pre op weight 83 kg Current Weight  05/30/13 207 lb 7.3 oz (94.1 kg)      Intake/Output from previous day: 03/24 0701 - 03/25 0700 In: 1360 [P.O.:1260; I.V.:100] Out: 300 [Urine:300]   Physical Exam:  Cardiovascular: IRRR, IRRR;no murmurs, gallops, or rubs. Pulmonary: Slightly diminished at bases; no rales, wheezes, or rhonchi. Abdomen: Soft, non tender, bowel sounds present. Extremities: ++ bilateral lower extremity edema. Wounds: Clean and dry.  No erythema or signs of infection. Neuro: Grossly intact without focal deficits  Lab Results: CBC:  Recent Labs  05/28/13 0528  WBC 8.3  HGB 8.7*  HCT 27.0*  PLT 245   BMET:   Recent Labs  05/29/13 0600 05/30/13 0341  NA 140 141  K 4.3 4.7  CL 98 99  CO2 30 33*  GLUCOSE 184* 103*  BUN 56* 50*  CREATININE 1.46* 1.48*  CALCIUM 8.9 8.9    PT/INR:  Lab Results  Component Value Date   INR 2.40* 05/30/2013   INR 2.34* 05/29/2013   INR 2.50* 05/28/2013   ABG:  INR: Will add last result for INR, ABG once components are confirmed Will add last 4 CBG results once components are confirmed  Assessment/Plan:  1. CV -  A fib with HR in the low 100's this am.On Amiodarone 200 daily, Lopressor 12.5 bid, and Coumadin. INR 2.4. Per  Dr. Roxy Manns, will add aldactone 25 daily. 2.  Pulmonary - On 1 liters of oxygen via [place].Weaning as tolerates.Encourage incentive spirometer 3. Volume overload-Continue Lasix 80 bid.  4.  Acute blood loss anemia - Last H and H 8.7 and 27. Continue Niferex. 5. History of CKD. Creatinine stable 1.48  6. To SNF today  ZIMMERMAN,DONIELLE MPA-C 05/30/2013,7:52 AM    I have seen and examined the patient and agree with the assessment and plan as outlined.  Will add aldactone and continue lasix 80 mg bid.  Will need close f/u by Dr Harl Bowie and heart failure team.  Rexene Alberts 05/30/2013 8:34 AM

## 2013-05-30 NOTE — Progress Notes (Signed)
Chest tube sutures d/c at this time; steri strips applied to site; no drainage noted; will cont. To monitor.

## 2013-05-30 NOTE — Progress Notes (Signed)
Pt and daughter given d/c instructions; IV and tele monitor d/c; pt to d/c to [facility] in [place]; daughter to transport pt to SNF; pt verbalized understanding; will cont. To monitor

## 2013-05-30 NOTE — Progress Notes (Signed)
0900-0927 Education completed with pt and daughter. Understanding voiced. Stressed importance of walking and using IS and flutter valve. Pt knows she needs to walk three times daily. Encouraged her to watch sodium and gave low sodium diets. Discussed CRP 2 and pt gave permission to refer to [facility] program. Graylon Good RN BSN 05/30/2013 9:27 AM

## 2013-06-01 ENCOUNTER — Telehealth: Payer: Self-pay | Admitting: Cardiology

## 2013-06-01 MED ORDER — WARFARIN SODIUM 2 MG PO TABS: ORAL_TABLET | ORAL | Status: DC

## 2013-06-01 NOTE — Telephone Encounter (Signed)
Informed Rosa at [facility] center of below.

## 2013-06-01 NOTE — Telephone Encounter (Signed)
Attempted to call X2 no answer.

## 2013-06-01 NOTE — Telephone Encounter (Signed)
[name] [name] with [facility] called and reported pt PT to be 24.6 and INR was 2.3. Pt dose of warfarin is 2 MG 1 tablet by mouth every night. Do you want pt to continue medication or make any changes per centers request.

## 2013-06-01 NOTE — Telephone Encounter (Signed)
Please change dosing to 2mg  alternating with 3 mg daily, repeat INR in 5 days. Given her mitral valve replacement, would like INR goal to be 2.5 to 3.5.  Carlyle Dolly MD

## 2013-06-04 ENCOUNTER — Encounter: Payer: Self-pay | Admitting: Thoracic Surgery (Cardiothoracic Vascular Surgery)

## 2013-06-04 ENCOUNTER — Inpatient Hospital Stay (HOSPITAL_COMMUNITY)
Admission: EM | Admit: 2013-06-04 | Discharge: 2013-06-13 | DRG: 292 | Disposition: A | Discharge status: Skilled Nursing Facility | Payer: Medicare HMO | Attending: Cardiology | Admitting: Cardiology

## 2013-06-04 ENCOUNTER — Emergency Department (HOSPITAL_COMMUNITY): Payer: Medicare HMO

## 2013-06-04 ENCOUNTER — Encounter (HOSPITAL_COMMUNITY): Payer: Self-pay | Admitting: Emergency Medicine

## 2013-06-04 ENCOUNTER — Inpatient Hospital Stay: Admission: AD | Admit: 2013-06-04 | Payer: Self-pay | Source: Ambulatory Visit | Admitting: Cardiology

## 2013-06-04 ENCOUNTER — Ambulatory Visit: Payer: Medicare HMO | Admitting: Thoracic Surgery (Cardiothoracic Vascular Surgery)

## 2013-06-04 ENCOUNTER — Other Ambulatory Visit: Payer: Self-pay | Admitting: Thoracic Surgery (Cardiothoracic Vascular Surgery)

## 2013-06-04 ENCOUNTER — Ambulatory Visit (INDEPENDENT_AMBULATORY_CARE_PROVIDER_SITE_OTHER): Payer: Commercial Managed Care - HMO | Admitting: Thoracic Surgery (Cardiothoracic Vascular Surgery)

## 2013-06-04 ENCOUNTER — Ambulatory Visit
Admission: RE | Admit: 2013-06-04 | Discharge: 2013-06-04 | Disposition: A | Discharge status: Home / Self Care | Payer: Commercial Managed Care - HMO | Source: Ambulatory Visit | Attending: Thoracic Surgery (Cardiothoracic Vascular Surgery) | Admitting: Thoracic Surgery (Cardiothoracic Vascular Surgery)

## 2013-06-04 VITALS — BP 109/64 | HR 120 | Temp 101.5°F | Resp 20 | Ht 62.0 in | Wt 206.0 lb

## 2013-06-04 DIAGNOSIS — I509 Heart failure, unspecified: Secondary | ICD-10-CM

## 2013-06-04 DIAGNOSIS — N183 Chronic kidney disease, stage 3 unspecified: Secondary | ICD-10-CM | POA: Diagnosis present

## 2013-06-04 DIAGNOSIS — I4891 Unspecified atrial fibrillation: Secondary | ICD-10-CM

## 2013-06-04 DIAGNOSIS — Z8679 Personal history of other diseases of the circulatory system: Secondary | ICD-10-CM

## 2013-06-04 DIAGNOSIS — I129 Hypertensive chronic kidney disease with stage 1 through stage 4 chronic kidney disease, or unspecified chronic kidney disease: Secondary | ICD-10-CM | POA: Diagnosis present

## 2013-06-04 DIAGNOSIS — L0291 Cutaneous abscess, unspecified: Secondary | ICD-10-CM

## 2013-06-04 DIAGNOSIS — I059 Rheumatic mitral valve disease, unspecified: Secondary | ICD-10-CM

## 2013-06-04 DIAGNOSIS — Z8541 Personal history of malignant neoplasm of cervix uteri: Secondary | ICD-10-CM

## 2013-06-04 DIAGNOSIS — Z952 Presence of prosthetic heart valve: Secondary | ICD-10-CM

## 2013-06-04 DIAGNOSIS — L0390 Cellulitis, unspecified: Secondary | ICD-10-CM

## 2013-06-04 DIAGNOSIS — Z7982 Long term (current) use of aspirin: Secondary | ICD-10-CM

## 2013-06-04 DIAGNOSIS — I5033 Acute on chronic diastolic (congestive) heart failure: Principal | ICD-10-CM

## 2013-06-04 DIAGNOSIS — Z9889 Other specified postprocedural states: Secondary | ICD-10-CM

## 2013-06-04 DIAGNOSIS — Z7901 Long term (current) use of anticoagulants: Secondary | ICD-10-CM

## 2013-06-04 DIAGNOSIS — N179 Acute kidney failure, unspecified: Secondary | ICD-10-CM

## 2013-06-04 DIAGNOSIS — Z6834 Body mass index (BMI) 34.0-34.9, adult: Secondary | ICD-10-CM

## 2013-06-04 DIAGNOSIS — E669 Obesity, unspecified: Secondary | ICD-10-CM | POA: Diagnosis present

## 2013-06-04 DIAGNOSIS — R5381 Other malaise: Secondary | ICD-10-CM | POA: Diagnosis present

## 2013-06-04 DIAGNOSIS — Z96659 Presence of unspecified artificial knee joint: Secondary | ICD-10-CM

## 2013-06-04 DIAGNOSIS — N189 Chronic kidney disease, unspecified: Secondary | ICD-10-CM

## 2013-06-04 DIAGNOSIS — D649 Anemia, unspecified: Secondary | ICD-10-CM | POA: Diagnosis present

## 2013-06-04 DIAGNOSIS — J4489 Other specified chronic obstructive pulmonary disease: Secondary | ICD-10-CM | POA: Diagnosis present

## 2013-06-04 DIAGNOSIS — Z954 Presence of other heart-valve replacement: Secondary | ICD-10-CM

## 2013-06-04 DIAGNOSIS — I4892 Unspecified atrial flutter: Secondary | ICD-10-CM

## 2013-06-04 DIAGNOSIS — Z8249 Family history of ischemic heart disease and other diseases of the circulatory system: Secondary | ICD-10-CM

## 2013-06-04 DIAGNOSIS — Z953 Presence of xenogenic heart valve: Secondary | ICD-10-CM

## 2013-06-04 DIAGNOSIS — L02419 Cutaneous abscess of limb, unspecified: Secondary | ICD-10-CM | POA: Diagnosis present

## 2013-06-04 DIAGNOSIS — Z888 Allergy status to other drugs, medicaments and biological substances status: Secondary | ICD-10-CM

## 2013-06-04 DIAGNOSIS — I5031 Acute diastolic (congestive) heart failure: Secondary | ICD-10-CM

## 2013-06-04 DIAGNOSIS — E785 Hyperlipidemia, unspecified: Secondary | ICD-10-CM | POA: Diagnosis present

## 2013-06-04 DIAGNOSIS — J449 Chronic obstructive pulmonary disease, unspecified: Secondary | ICD-10-CM | POA: Diagnosis present

## 2013-06-04 DIAGNOSIS — Q231 Congenital insufficiency of aortic valve: Secondary | ICD-10-CM

## 2013-06-04 DIAGNOSIS — I359 Nonrheumatic aortic valve disorder, unspecified: Secondary | ICD-10-CM | POA: Diagnosis present

## 2013-06-04 DIAGNOSIS — L03119 Cellulitis of unspecified part of limb: Secondary | ICD-10-CM

## 2013-06-04 LAB — BASIC METABOLIC PANEL
BUN: 33 mg/dL — AB (ref 6–23)
CHLORIDE: 100 meq/L (ref 96–112)
CO2: 28 meq/L (ref 19–32)
CREATININE: 1.68 mg/dL — AB (ref 0.50–1.10)
Calcium: 9 mg/dL (ref 8.4–10.5)
GFR calc Af Amer: 34 mL/min — ABNORMAL LOW (ref >90)
GFR calc non Af Amer: 29 mL/min — ABNORMAL LOW (ref >90)
Glucose, Bld: 96 mg/dL (ref 70–99)
Potassium: 4.6 mEq/L (ref 3.7–5.3)
Sodium: 141 mEq/L (ref 137–147)

## 2013-06-04 LAB — CBC
HCT: 30.1 % — ABNORMAL LOW (ref 36.0–46.0)
Hemoglobin: 9.5 g/dL — ABNORMAL LOW (ref 12.0–15.0)
MCH: 31.8 pg (ref 26.0–34.0)
MCHC: 31.6 g/dL (ref 30.0–36.0)
MCV: 100.7 fL — AB (ref 78.0–100.0)
Platelets: 239 10*3/uL (ref 150–400)
RBC: 2.99 MIL/uL — AB (ref 3.87–5.11)
RDW: 17.9 % — ABNORMAL HIGH (ref 11.5–15.5)
WBC: 6 10*3/uL (ref 4.0–10.5)

## 2013-06-04 LAB — URINALYSIS, ROUTINE W REFLEX MICROSCOPIC
BILIRUBIN URINE: NEGATIVE
Glucose, UA: NEGATIVE mg/dL
Hgb urine dipstick: NEGATIVE
KETONES UR: NEGATIVE mg/dL
Leukocytes, UA: NEGATIVE
Nitrite: NEGATIVE
PH: 6 (ref 5.0–8.0)
Protein, ur: NEGATIVE mg/dL
Specific Gravity, Urine: 1.011 (ref 1.005–1.030)
Urobilinogen, UA: 0.2 mg/dL (ref 0.0–1.0)

## 2013-06-04 LAB — I-STAT TROPONIN, ED: Troponin i, poc: 0.1 ng/mL (ref 0.00–0.08)

## 2013-06-04 LAB — PROTIME-INR
INR: 2.73 — ABNORMAL HIGH (ref 0.00–1.49)
PROTHROMBIN TIME: 28 s — AB (ref 11.6–15.2)

## 2013-06-04 LAB — GLUCOSE, CAPILLARY
Glucose-Capillary: 99 mg/dL (ref 70–99)
Glucose-Capillary: 124 mg/dL — ABNORMAL HIGH (ref 70–99)

## 2013-06-04 LAB — PRO B NATRIURETIC PEPTIDE: Pro B Natriuretic peptide (BNP): 5763 pg/mL — ABNORMAL HIGH (ref 0–125)

## 2013-06-04 MED ORDER — ACETAMINOPHEN 325 MG PO TABS
650.0000 mg | ORAL_TABLET | Freq: Four times a day (QID) | ORAL | Status: DC | PRN
  Administered 2013-06-05 – 2013-06-12 (×6): 650 mg via ORAL
  Filled 2013-06-04 (×7): qty 2

## 2013-06-04 MED ORDER — TRAMADOL HCL 50 MG PO TABS
50.0000 mg | ORAL_TABLET | Freq: Two times a day (BID) | ORAL | Status: DC | PRN
  Administered 2013-06-04 – 2013-06-12 (×2): 50 mg via ORAL
  Filled 2013-06-04 (×2): qty 1

## 2013-06-04 MED ORDER — POLYSACCHARIDE IRON COMPLEX 150 MG PO CAPS
150.0000 mg | ORAL_CAPSULE | Freq: Every day | ORAL | Status: DC
  Administered 2013-06-05 – 2013-06-13 (×8): 150 mg via ORAL
  Filled 2013-06-04 (×10): qty 1

## 2013-06-04 MED ORDER — FUROSEMIDE 10 MG/ML IJ SOLN
80.0000 mg | Freq: Three times a day (TID) | INTRAMUSCULAR | Status: DC
  Administered 2013-06-04 – 2013-06-06 (×5): 80 mg via INTRAVENOUS
  Filled 2013-06-04 (×7): qty 8

## 2013-06-04 MED ORDER — VANCOMYCIN HCL 10 G IV SOLR
1250.0000 mg | Freq: Once | INTRAVENOUS | Status: AC
  Administered 2013-06-04: 1250 mg via INTRAVENOUS
  Filled 2013-06-04: qty 1250

## 2013-06-04 MED ORDER — METOPROLOL TARTRATE 12.5 MG HALF TABLET
12.5000 mg | ORAL_TABLET | Freq: Two times a day (BID) | ORAL | Status: DC
  Administered 2013-06-04 – 2013-06-05 (×3): 12.5 mg via ORAL
  Filled 2013-06-04 (×5): qty 1

## 2013-06-04 MED ORDER — GUAIFENESIN ER 600 MG PO TB12
600.0000 mg | ORAL_TABLET | Freq: Two times a day (BID) | ORAL | Status: DC | PRN
  Administered 2013-06-12: 600 mg via ORAL
  Filled 2013-06-04: qty 1

## 2013-06-04 MED ORDER — FUROSEMIDE 10 MG/ML IJ SOLN: 80.0000 mg | Freq: Two times a day (BID) | INTRAMUSCULAR | Status: DC

## 2013-06-04 MED ORDER — VANCOMYCIN HCL IN DEXTROSE 1-5 GM/200ML-% IV SOLN
1000.0000 mg | INTRAVENOUS | Status: DC
  Administered 2013-06-05 – 2013-06-08 (×5): 1000 mg via INTRAVENOUS
  Filled 2013-06-04 (×5): qty 200

## 2013-06-04 MED ORDER — DARIFENACIN HYDROBROMIDE ER 7.5 MG PO TB24
7.5000 mg | ORAL_TABLET | Freq: Every day | ORAL | Status: DC
  Administered 2013-06-05 – 2013-06-13 (×8): 7.5 mg via ORAL
  Filled 2013-06-04 (×9): qty 1

## 2013-06-04 MED ORDER — WARFARIN SODIUM 2 MG PO TABS
2.0000 mg | ORAL_TABLET | Freq: Once | ORAL | Status: AC
  Administered 2013-06-04: 2 mg via ORAL
  Filled 2013-06-04: qty 1

## 2013-06-04 MED ORDER — SODIUM CHLORIDE 0.9 % IJ SOLN: 3.0000 mL | INTRAMUSCULAR | Status: DC | PRN

## 2013-06-04 MED ORDER — WARFARIN - PHARMACIST DOSING INPATIENT
Freq: Every day | Status: DC
  Administered 2013-06-09: 18:00:00

## 2013-06-04 MED ORDER — AMIODARONE HCL 200 MG PO TABS: 200.0000 mg | ORAL_TABLET | Freq: Every day | ORAL | Status: DC

## 2013-06-04 MED ORDER — SODIUM CHLORIDE 0.9 % IJ SOLN
3.0000 mL | Freq: Two times a day (BID) | INTRAMUSCULAR | Status: DC
  Administered 2013-06-05 – 2013-06-07 (×2): 3 mL via INTRAVENOUS

## 2013-06-04 MED ORDER — SIMVASTATIN 10 MG PO TABS
10.0000 mg | ORAL_TABLET | Freq: Every day | ORAL | Status: DC
  Administered 2013-06-04 – 2013-06-12 (×9): 10 mg via ORAL
  Filled 2013-06-04 (×10): qty 1

## 2013-06-04 MED ORDER — AMIODARONE LOAD VIA INFUSION
150.0000 mg | Freq: Once | INTRAVENOUS | Status: AC
  Administered 2013-06-04: 150 mg via INTRAVENOUS
  Filled 2013-06-04: qty 83.34

## 2013-06-04 MED ORDER — ASPIRIN EC 81 MG PO TBEC
81.0000 mg | DELAYED_RELEASE_TABLET | Freq: Every day | ORAL | Status: DC
  Filled 2013-06-04: qty 1

## 2013-06-04 MED ORDER — FUROSEMIDE 10 MG/ML IJ SOLN
60.0000 mg | Freq: Once | INTRAMUSCULAR | Status: AC
  Administered 2013-06-04: 60 mg via INTRAVENOUS
  Filled 2013-06-04: qty 6

## 2013-06-04 MED ORDER — AMIODARONE HCL IN DEXTROSE 360-4.14 MG/200ML-% IV SOLN
30.0000 mg/h | INTRAVENOUS | Status: DC
  Administered 2013-06-05 – 2013-06-07 (×5): 30 mg/h via INTRAVENOUS
  Filled 2013-06-04 (×11): qty 200

## 2013-06-04 MED ORDER — METOPROLOL TARTRATE 1 MG/ML IV SOLN
5.0000 mg | Freq: Once | INTRAVENOUS | Status: AC
  Administered 2013-06-04: 5 mg via INTRAVENOUS
  Filled 2013-06-04: qty 5

## 2013-06-04 MED ORDER — SPIRONOLACTONE 25 MG PO TABS
25.0000 mg | ORAL_TABLET | Freq: Every day | ORAL | Status: DC
  Administered 2013-06-04: 25 mg via ORAL
  Filled 2013-06-04 (×2): qty 1

## 2013-06-04 MED ORDER — AMIODARONE HCL IN DEXTROSE 360-4.14 MG/200ML-% IV SOLN
60.0000 mg/h | INTRAVENOUS | Status: AC
  Administered 2013-06-04 (×2): 60 mg/h via INTRAVENOUS
  Filled 2013-06-04 (×2): qty 200

## 2013-06-04 MED ORDER — POTASSIUM CHLORIDE CRYS ER 20 MEQ PO TBCR
20.0000 meq | EXTENDED_RELEASE_TABLET | Freq: Two times a day (BID) | ORAL | Status: DC
  Administered 2013-06-04: 20 meq via ORAL
  Filled 2013-06-04 (×3): qty 1

## 2013-06-04 MED ORDER — SODIUM CHLORIDE 0.9 % IV SOLN
250.0000 mL | INTRAVENOUS | Status: DC | PRN
  Administered 2013-06-11: 250 mL via INTRAVENOUS

## 2013-06-04 NOTE — Progress Notes (Signed)
ANTIBIOTIC CONSULT NOTE - INITIAL  Pharmacy Consult for Vancomycin  Indication: Cellulitis  Allergies  Allergen Reactions  . Indomethacin Other (See Comments)    dizziness  . Norvasc [Amlodipine Besylate] Cough   Patient Measurements: Height: 5\' 2"  (157.5 cm) Weight: 206 lb 9.1 oz (93.7 kg) IBW/kg (Calculated) : 50.1  Vital Signs: Temp: 98.2 F (36.8 C) (03/30 1630) Temp src: Oral (03/30 1630) BP: 86/49 mmHg (03/30 1715) Pulse Rate: 107 (03/30 1715) Intake/Output from previous day:   Intake/Output from this shift: Total I/O In: -  Out: 300 [Urine:300]  Labs:  Recent Labs  06/04/13 1248  WBC 6.0  HGB 9.5*  PLT 239  CREATININE 1.68*   Estimated Creatinine Clearance: 32.3 ml/min (by C-G formula based on Cr of 1.68).  Microbiology: Recent Results (from the past 720 hour(s))  CLOSTRIDIUM DIFFICILE BY PCR     Status: None   Collection Time    05/13/13 10:24 PM      Result Value Ref Range Status   C difficile by pcr NEGATIVE  NEGATIVE Final  SURGICAL PCR SCREEN     Status: None   Collection Time    05/15/13  6:10 AM      Result Value Ref Range Status   MRSA, PCR NEGATIVE  NEGATIVE Final   Staphylococcus aureus NEGATIVE  NEGATIVE Final   Comment:            The Xpert SA Assay (FDA     approved for NASAL specimens     in patients over 73 years of age),     is one component of     a comprehensive surveillance     program.  Test performance has     been validated by [employer] for patients greater     than or equal to 73 year old.     It is not intended     to diagnose infection nor to     guide or monitor treatment.  URINE CULTURE     Status: None   Collection Time    05/18/13  2:08 PM      Result Value Ref Range Status   Specimen Description URINE, CATHETERIZED   Final   Special Requests NONE   Final   Culture  Setup Time     Final   Value: 05/18/2013 21:10     Performed at [facility]     Final   Value: NO GROWTH   Performed at [facility]   Culture     Final   Value: NO GROWTH     Performed at [facility]   Report Status 05/19/2013 FINAL   Final  CULTURE, BLOOD (ROUTINE X 2)     Status: None   Collection Time    05/18/13  2:32 PM      Result Value Ref Range Status   Specimen Description BLOOD RIGHT ARM   Final   Special Requests BOTTLES DRAWN AEROBIC AND ANAEROBIC 10CC   Final   Culture  Setup Time     Final   Value: 05/18/2013 20:44     Performed at [facility]   Culture     Final   Value: NO GROWTH 5 DAYS     Performed at [facility]   Report Status 05/24/2013 FINAL   Final  CULTURE, BLOOD (ROUTINE X 2)     Status: None   Collection Time  05/18/13  2:43 PM      Result Value Ref Range Status   Specimen Description BLOOD RIGHT HAND   Final   Special Requests BOTTLES DRAWN AEROBIC ONLY 10CC   Final   Culture  Setup Time     Final   Value: 05/18/2013 20:43     Performed at [facility]   Culture     Final   Value: NO GROWTH 5 DAYS     Performed at [facility]   Report Status 05/24/2013 FINAL   Final  CULTURE, RESPIRATORY (NON-EXPECTORATED)     Status: None   Collection Time    05/18/13  7:35 PM      Result Value Ref Range Status   Specimen Description TRACHEAL ASPIRATE   Final   Special Requests NONE   Final   Gram Stain     Final   Value: MODERATE WBC PRESENT,BOTH PMN AND MONONUCLEAR     NO SQUAMOUS EPITHELIAL CELLS SEEN     NO ORGANISMS SEEN     Performed at [facility]   Culture     Final   Value: Non-Pathogenic Oropharyngeal-type Flora Isolated.     Performed at [facility]   Report Status 05/21/2013 FINAL   Final  CLOSTRIDIUM DIFFICILE BY PCR     Status: None   Collection Time    05/20/13 10:51 AM      Result Value Ref Range Status   C difficile by pcr NEGATIVE  NEGATIVE Final  CLOSTRIDIUM DIFFICILE BY PCR     Status: None   Collection Time    05/26/13  9:54 AM      Result Value Ref Range Status    C difficile by pcr NEGATIVE  NEGATIVE Final   Medical History: Past Medical History  Diagnosis Date  . Chronic airway obstruction, not elsewhere classified   . Precordial pain   . Swelling of limb   . Dizziness and giddiness   . Unspecified transient cerebral ischemia     Diag. 2003  . Acute, but ill-defined, cerebrovascular disease     2000 [place]  . Shortness of breath   . Obesity   . Juvenile rheumatic fever     age 73  . Unspecified essential hypertension   . Hyperlipidemia   . Mitral regurgitation 02/16/2013  . Chronic kidney disease   . CHF (congestive heart failure) 04/23/2013  . Paroxysmal atrial fibrillation 02/16/2013    Recurrent paroxysmal atrial fibrillation, first diagnosed December 2014  . Chronic diastolic congestive heart failure   . Arthritis     Chronic left knee pain  . Aortic insufficiency due to bicuspid aortic valve     moderate by TEE  . 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 S/P Maze operation for atrial fibrillation 05/16/2013    Complete bilateral atrial lesion set using cryothermy and bipolar radiofrequency ablation with oversewing of LA appendage   Medications:  Anti-infectives   Start     Dose/Rate Route Frequency Ordered Stop   06/04/13 1730  vancomycin (VANCOCIN) 1,250 mg in sodium chloride 0.9 % 250 mL IVPB     1,250 mg 166.7 mL/hr over 90 Minutes Intravenous  Once 06/04/13 1725       Assessment: 73 yof admitted with lower extremity swelling and found to have cellulitis.  We have been asked to initiate IV  Vancomycin for her as an empiric regimen.  She has some chronic kidney disease with a creatinine of 1.68 with an estimated clearance of 22ml/min.  Currently, her CBC does not reveal any leukocytosis and her microbiology data has been negative.  Goal of Therapy:  Vancomycin trough level  10-15 mcg/ml  Plan:  1.  Will begin with 1250mg  IV Vancomycin x 1 dose then change to 1000 mg IV daily. 2.  Monitor culture data and clinical response to therapy. 3.  F/U renal function and dose adjust as needed. 4.  Will obtain a s/s level if she is to continue > 72 hours.  Rober Minion, PharmD., MS Clinical Pharmacist Pager:  [phone] Thank you for allowing pharmacy to be part of this patients care team. 06/04/2013,5:42 PM

## 2013-06-04 NOTE — ED Notes (Signed)
Cardiology at bedside with patient

## 2013-06-04 NOTE — Consult Note (Signed)
Advanced Heart Failure Team History and Physical Note   Primary Physician: Dr. Quintin Alto Primary Cardiologist:  Dr. Harl Bowie  Reason for Admission: A/C HF  Baseline proBNP: 460    HPI:    [name] [name] is a 73 yo obese white female with known history of rheumatic fever during childhood and long standing history of chronic diastolic congestive heart failure. She also has a history of CKD, HTN and PAF. The patient has been admitted several times with recurrent exacerbations of CHF. Progressed to severe symptomatic Mitral Regurgitation with Paroxysmal Atrial Fibrillation and underwent minimally invasive maze procedure with MV replacement (05/16/13). Was discharged 05/31/13 to [facility] center and weight was 207 lbs.  Presented to post-op visit with Dr. Roxy Manns this am. +orthopnea, LE edema, diarrhea and low grade fever.   Review of Systems: [y] = yes, [ ]  = no   General: Weight gain [Y]; Weight loss [ ] ; Anorexia [ ] ; Fatigue [ Y]; Fever [Y ]; Chills [ ] ; Weakness [ ]   Cardiac: Chest pain/pressure [ ] ; Resting SOB [ ] ; Exertional SOB [Y ]; Orthopnea [Y ]; Pedal Edema [Y ]; Palpitations [ ] ; Syncope [ ] ; Presyncope [ ] ; Paroxysmal nocturnal dyspnea[ ]   Pulmonary: Cough [ ] ; Wheezing[ ] ; Hemoptysis[ ] ; Sputum [ ] ; Snoring [ ]   GI: Vomiting[ ] ; Dysphagia[ ] ; Melena[ ] ; Hematochezia [ ] ; Heartburn[ ] ; Abdominal pain [ ] ; Constipation [ ] ; Diarrhea [ Y]; BRBPR [ ]   GU: Hematuria[ ] ; Dysuria [ ] ; Nocturia[ ]   Vascular: Pain in legs with walking [ ] ; Pain in feet with lying flat [ ] ; Non-healing sores [ ] ; Stroke [ ] ; TIA [ ] ; Slurred speech [ ] ;  Neuro: Headaches[ ] ; Vertigo[ ] ; Seizures[ ] ; Paresthesias[ ] ;Blurred vision [ ] ; Diplopia [ ] ; Vision changes [ ]   Ortho/Skin: Arthritis [ ] ; Joint pain [Y ]; Muscle pain [ ] ; Joint swelling [ ] ; Back Pain [ ] ; Rash [ ]   Psych: Depression[ ] ; Anxiety[ ]   Heme: Bleeding problems [ ] ; Clotting disorders [ ] ; Anemia [ ]   Endocrine: Diabetes [ ] ; Thyroid dysfunction[  ]  Home Medications Prior to Admission medications   Medication Sig Start Date End Date Taking? Authorizing Provider  acetaminophen (TYLENOL) 325 MG tablet Take 650 mg by mouth every 6 (six) hours as needed for moderate pain.    Historical Provider, MD  amiodarone (PACERONE) 200 MG tablet Take 1 tablet (200 mg total) by mouth daily. 05/30/13   Donielle Liston Alba, PA-C  aspirin EC 81 MG tablet Take 81 mg by mouth daily.    Historical Provider, MD  Calcium Carbonate-Vitamin D (CALTRATE 600+D PO) Take 1 tablet by mouth daily. Soft chew    Historical Provider, MD  furosemide (LASIX) 40 MG tablet Take 2 tablets (80 mg total) by mouth 2 (two) times daily. 05/30/13   Donielle Liston Alba, PA-C  guaiFENesin (MUCINEX) 600 MG 12 hr tablet Take 1 tablet (600 mg total) by mouth every 12 (twelve) hours as needed for cough. 05/30/13   Donielle Liston Alba, PA-C  iron polysaccharides (NIFEREX) 150 MG capsule Take 1 capsule (150 mg total) by mouth daily. For one month then stop. 05/30/13   Donielle Liston Alba, PA-C  Loperamide HCl (IMODIUM PO) Take by mouth daily as needed. After each loose stools    Historical Provider, MD  metoprolol tartrate (LOPRESSOR) 25 MG tablet Take 0.5 tablets (12.5 mg total) by mouth 2 (two) times daily. 05/30/13   Donielle Liston Alba, PA-C  potassium chloride SA (K-DUR,KLOR-CON) 20  MEQ tablet Take 1 tablet (20 mEq total) by mouth daily. 05/30/13   Donielle Liston Alba, PA-C  pravastatin (PRAVACHOL) 20 MG tablet Take 20 mg by mouth at bedtime.     Historical Provider, MD  solifenacin (VESICARE) 5 MG tablet Take 5 mg by mouth daily.      Historical Provider, MD  spironolactone (ALDACTONE) 25 MG tablet Take 1 tablet (25 mg total) by mouth daily. 05/30/13   Donielle Liston Alba, PA-C  traMADol (ULTRAM) 50 MG tablet Take 1 tablet (50 mg total) by mouth every 12 (twelve) hours as needed for moderate pain. 05/30/13   Donielle Liston Alba, PA-C  warfarin (COUMADIN) 2 MG tablet 2 MG Monday,  Wednesday, and Saturday and 3 MG all other days. 06/01/13   [name], MD    Past Medical History: Past Medical History  Diagnosis Date  . Chronic airway obstruction, not elsewhere classified   . Precordial pain   . Swelling of limb   . Dizziness and giddiness   . Unspecified transient cerebral ischemia     Diag. 2003  . Acute, but ill-defined, cerebrovascular disease     2000 [place]  . Shortness of breath   . Obesity   . Juvenile rheumatic fever     age 21  . Unspecified essential hypertension   . Hyperlipidemia   . Mitral regurgitation 02/16/2013  . Chronic kidney disease   . CHF (congestive heart failure) 04/23/2013  . Paroxysmal atrial fibrillation 02/16/2013    Recurrent paroxysmal atrial fibrillation, first diagnosed December 2014  . Chronic diastolic congestive heart failure   . Arthritis     Chronic left knee pain  . Aortic insufficiency due to bicuspid aortic valve     moderate by TEE  . 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 S/P Maze operation for atrial fibrillation 05/16/2013    Complete bilateral atrial lesion set using cryothermy and bipolar radiofrequency ablation with oversewing of LA appendage    Past Surgical History: Past Surgical History  Procedure Laterality Date  . Total knee arthroplasty Right   . Abdominal hysterectomy      Cervical Cancer  . Parathyroid/thyroid surgery      tumor  . Tee without cardioversion N/A 04/06/2013    Procedure: TRANSESOPHAGEAL ECHOCARDIOGRAM (TEE);  Surgeon: [name], MD;  [facility];  Service: Cardiology;  Laterality: N/A;  . Minimally invasive maze procedure N/A 05/16/2013    Procedure: MINIMALLY INVASIVE MAZE PROCEDURE;  Surgeon: Rexene Alberts, MD;  Location: [facility];  Service: Open Heart Surgery;  Laterality: N/A;  . Intraoperative  transesophageal echocardiogram N/A 05/16/2013    Procedure: INTRAOPERATIVE TRANSESOPHAGEAL ECHOCARDIOGRAM;  Surgeon: Rexene Alberts, MD;  Location: [place];  Service: Open Heart Surgery;  Laterality: N/A;  . Mitral valve replacement Right 05/16/2013    Procedure: MINIMALLY INVASIVE MITRAL VALVE (MV) REPLACEMENT;  Surgeon: Rexene Alberts, MD;  Location: [place];  Service: Open Heart Surgery;  Laterality: Right;    Family History: Family History  Problem Relation Age of Onset  . Heart failure Father   . Heart attack Brother     Social History: History   Social History  . Marital Status: Widowed    Spouse Name: N/A    Number of Children: N/A  . Years of Education: N/A   Social History Main Topics  .  Smoking status: Never Smoker   . Smokeless tobacco: Never Used  . Alcohol Use: No  . Drug Use: No  . Sexual Activity: None   Other Topics Concern  . 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 Indomethacin Other (See Comments)    dizziness  . Norvasc [Amlodipine Besylate] Cough    Objective:    Vital Signs:   Temp:  [98.4 F (36.9 C)-101.5 F (38.6 C)] 98.4 F (36.9 C) (03/30 1246) Pulse Rate:  [117-121] 117 (03/30 1354) Resp:  [18-26] 21 (03/30 1354) BP: (90-109)/(50-64) 90/53 mmHg (03/30 1354) SpO2:  [92 %-100 %] 98 % (03/30 1354) Weight:  [206 lb (93.441 kg)-210 lb (95.255 kg)] 210 lb (95.255 kg) (03/30 1246)   Filed Weights   06/04/13 1246  Weight: 210 lb (95.255 kg)    Physical Exam: General:  Chronically ill appearing. No resp difficulty HEENT: normal Neck: supple. JVP 12. Carotids 2+ bilat; no bruits. No lymphadenopathy or thryomegaly appreciated. Cor: PMI nondisplaced. Tachy rate & regular rhythm. No rubs, gallops or murmurs. Lungs: clear Abdomen: soft, nontender, +distended. No hepatosplenomegaly. No bruits or  masses. Good bowel sounds. Extremities: no cyanosis, clubbing, rash, bilateral LE 2-3+ edema to thighs with erythema Neuro: alert & orientedx3, cranial nerves grossly intact. moves all 4 extremities w/o difficulty. Affect pleasant  Telemetry: ST 115 bpm  Labs: Basic Metabolic Panel:  Recent Labs Lab 05/28/13 2327 05/29/13 0600 05/30/13 0341 06/04/13 1248  NA 140 140 141 141  K 4.6 4.3 4.7 4.6  CL 100 98 99 100  CO2 30 30 33* 28  GLUCOSE 121* 184* 103* 96  BUN 58* 56* 50* 33*  CREATININE 1.52* 1.46* 1.48* 1.68*  CALCIUM 9.0 8.9 8.9 9.0  MG 2.0  --  1.9  --     Liver Function Tests: No results found for this basename: AST, ALT, ALKPHOS, BILITOT, PROT, ALBUMIN,  in the last 168 hours No results found for this basename: LIPASE, AMYLASE,  in the last 168 hours No results found for this basename: AMMONIA,  in the last 168 hours  CBC:  Recent Labs Lab 06/04/13 1248  WBC 6.0  HGB 9.5*  HCT 30.1*  MCV 100.7*  PLT 239    Cardiac Enzymes: No results found for this basename: CKTOTAL, CKMB, CKMBINDEX, TROPONINI,  in the last 168 hours  BNP: BNP (last 3 results)  Recent Labs  05/25/13 0402 05/28/13 0528 06/04/13 1248  PROBNP 5322.0* 3515.0* 5763.0*    CBG: No results found for this basename: GLUCAP,  in the last 168 hours  Coagulation Studies:  Recent Labs  06/04/13 1248  LABPROT 28.0*  INR 2.73*    Other results: EKG: ST 115 bpm  Imaging: Dg Chest 1 View  06/04/2013   CLINICAL DATA:  Shortness of breath. Status post mitral valve replacement and Maze procedure on 05/16/2013.  EXAM: CHEST - 1 VIEW  COMPARISON:  DG CHEST 2 VIEW dated 05/26/2013; DG CHEST 2 VIEW dated 05/23/2013; DG CHEST 1V PORT dated 05/22/2013; DG CHEST 2 VIEW dated 03/20/2013; CT ANGIO CHEST AORTA W/CM & WO/CM dated 05/07/2013; DG CHEST 1V PORT dated 05/17/2013  FINDINGS: There is stable cardiomegaly. Mitral valve replacement visible and stable in appearance lungs show some chronic opacity in the  right lower lung zone which has essentially  been stable following cardiac surgery. No overt edema is identified. There is no evidence of significant pleural fluid or pneumothorax.  IMPRESSION: Stable cardiomegaly and postoperative right lower lung opacity which is likely a combination of atelectasis and scarring.   Electronically Signed   By: Aletta Edouard M.D.   On: 06/04/2013 11:20         Assessment:   1) A/C diastolic HF 2) Afib with RVR 3) HTN 4) CKD  Plan/Discussion:    Ms. [name] [name] presented to the ED after her TCTS appointment. She is markedly volume overloaded. Will admit for diuresis. Will order 80 mg IV BID. Repeat ECHO, concerned that she may need milrinone to facilitate with her diuresis, however will not use currently with her being in afib with RVR. Will give amiodarone bolus and continue gtt. She is on coumadin will have pharmacy dose.   Length of Stay: 0  Rande Brunt NP-C 06/04/2013, 2:10 PM  Advanced Heart Failure Team Pager [phone] (M-F; 7a - 4p)  Please contact [facility] for night-coverage after hours (4p -7a ) and weekends on [email or web address]  Patient seen with NP, agree with the above note.  She returns to the hospital very volume overloaded post-MV repair and Maze.  She is in atrial fibrillation.  1. Acute on chronic diastolic CHF: She is very volume overloaded.   - Lasix 80 mg IV every 8 hrs - Will get echo to assess LV/RV fxn 2. CKD: Creatinine at baseline.  Follow closely with diuresis.  3. Atrial fibrillation: She is back in atrial fibrillation.  Has been in and out of fibrillation since surgery, think she was probably in fibrillation at discharge.  Will start amiodarone gtt (has been on po amiodarone).  Continue coumadin.  Will consider TEE-guided DCCV if she remains in afib, think she will benefit from NSR.   4. Cellulitis: Possible lower extremity cellulitis (significant erythema).  T = 101.5 at arrival.  WBCs not elevated.  Will treat with  vancomycin for now.  5. Diarrhea: Send stool for c difficile.   [name] 06/04/2013 4:21 PM

## 2013-06-04 NOTE — Progress Notes (Signed)
ANTICOAGULATION CONSULT NOTE - Initial Consult  Pharmacy Consult for Coumadin Indication: atrial fibrillation  Allergies  Allergen Reactions  . Indomethacin Other (See Comments)    dizziness  . Norvasc [Amlodipine Besylate] Cough    Patient Measurements: Height: 5\' 5"  (165.1 cm) Weight: 210 lb (95.255 kg) IBW/kg (Calculated) : 57   Vital Signs: Temp: 97.7 F (36.5 C) (03/30 1452) Temp src: Oral (03/30 1452) BP: 97/61 mmHg (03/30 1511) Pulse Rate: 114 (03/30 1511)  Labs:  Recent Labs  06/04/13 1248  HGB 9.5*  HCT 30.1*  PLT 239  LABPROT 28.0*  INR 2.73*  CREATININE 1.68*    Estimated Creatinine Clearance: 34.5 ml/min (by C-G formula based on Cr of 1.68).   Medical History: Past Medical History  Diagnosis Date  . Chronic airway obstruction, not elsewhere classified   . Precordial pain   . Swelling of limb   . Dizziness and giddiness   . Unspecified transient cerebral ischemia     Diag. 2003  . Acute, but ill-defined, cerebrovascular disease     2000 [place]  . Shortness of breath   . Obesity   . Juvenile rheumatic fever     age 20  . Unspecified essential hypertension   . Hyperlipidemia   . Mitral regurgitation 02/16/2013  . Chronic kidney disease   . CHF (congestive heart failure) 04/23/2013  . Paroxysmal atrial fibrillation 02/16/2013    Recurrent paroxysmal atrial fibrillation, first diagnosed December 2014  . Chronic diastolic congestive heart failure   . Arthritis     Chronic left knee pain  . Aortic insufficiency due to bicuspid aortic valve     moderate by TEE  . 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 S/P Maze operation for atrial fibrillation 05/16/2013    Complete bilateral atrial lesion set using cryothermy and bipolar radiofrequency ablation with oversewing of LA appendage       Assessment: 72yof [facility] from [facility] for wight gain, orthopnea, low grade fever, diarrhea.  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  She is anticoagulated with Coumadin INR 2.7, no bleeding noted, CBC stable. Per med rec home Coumadin dose 2mg  MWF, 3mg  all other days.    Goal of Therapy:  INR 2-3 Monitor platelets by anticoagulation protocol: Yes   Plan:   Coumadin 2mg  x1 today  Daily Protime  Bonnita Nasuti Pharm.D. CPP, BCPS Clinical Pharmacist [phone] 06/04/2013 3:46 PM

## 2013-06-04 NOTE — Progress Notes (Signed)
[address]       [place] [address]             [phone]     CARDIOTHORACIC SURGERY OFFICE NOTE  Referring Provider is [name], MD PCP is [name], MD   HPI:  Patient returns for followup status post minimally invasive mitral valve replacement using a bioprosthetic tissue valve with Maze procedure on 05/16/2013. She was hospitalized for several weeks preoperatively for recurrent severe exacerbation of chronic diastolic congestive heart failure complicated by atrial fibrillation.  She suffers from numerous other comorbid medical problems and has been quite debilitated physically. Her mobility is very limited and she has severe chronic bilateral lower extremity edema.  Ultimately, once her medical condition was stabilized she was taken for surgery. At the time of surgery she was noted to have a bicuspid native aortic valve with moderate aortic insufficiency as well as severe mitral regurgitation. Left ventricular systolic function was only mildly reduced.  Her postoperative recovery was expectedly slow.  At one point she developed acute exacerbation of chronic kidney disease related to over diuresis, but her creatinine had returned towards her baseline prior to hospital discharge. She was discharged to the [facility] rehabilitation facility in [place] last week.  She returns to our office today as an unscheduled visit because she has not been doing well. She's developed diarrhea over the past few days with decreased energy. She develop severe exacerbation of chronic bilateral lower extremity edema, now with associated with redness and blister formation on the right lower leg. She denies any productive cough but she has developed a low-grade fever. Appetite has been marginal. She denies shortness of breath.   Current Outpatient Prescriptions  Medication Sig Dispense Refill  . acetaminophen (TYLENOL) 325 MG tablet Take 650 mg by mouth every 6 (six) hours as  needed for moderate pain.      Marland Kitchen amiodarone (PACERONE) 200 MG tablet Take 1 tablet (200 mg total) by mouth daily.      Marland Kitchen aspirin EC 81 MG tablet Take 81 mg by mouth daily.      . Calcium Carbonate-Vitamin D (CALTRATE 600+D PO) Take 1 tablet by mouth daily. Soft chew      . furosemide (LASIX) 40 MG tablet Take 2 tablets (80 mg total) by mouth 2 (two) times daily.  90 tablet  6  . guaiFENesin (MUCINEX) 600 MG 12 hr tablet Take 1 tablet (600 mg total) by mouth every 12 (twelve) hours as needed for cough.      . iron polysaccharides (NIFEREX) 150 MG capsule Take 1 capsule (150 mg total) by mouth daily. For one month then stop.      . Loperamide HCl (IMODIUM PO) Take by mouth daily as needed. After each loose stools      . metoprolol tartrate (LOPRESSOR) 25 MG tablet Take 0.5 tablets (12.5 mg total) by mouth 2 (two) times daily.  60 tablet  6  . potassium chloride SA (K-DUR,KLOR-CON) 20 MEQ tablet Take 1 tablet (20 mEq total) by mouth daily.  60 tablet  6  . pravastatin (PRAVACHOL) 20 MG tablet Take 20 mg by mouth at bedtime.       . solifenacin (VESICARE) 5 MG tablet Take 5 mg by mouth daily.        Marland Kitchen spironolactone (ALDACTONE) 25 MG tablet Take 1 tablet (25 mg total) by mouth daily.      . traMADol (ULTRAM) 50 MG tablet Take 1 tablet (  50 mg total) by mouth every 12 (twelve) hours as needed for moderate pain.  30 tablet  0  . warfarin (COUMADIN) 2 MG tablet 2 MG Monday, Wednesday, and Saturday and 3 MG all other days.  30 tablet  1   No current facility-administered medications for this visit.      Physical Exam:   BP 109/64  Pulse 120  Temp(Src) 101.5 F (38.6 C) (Oral)  Resp 20  Ht 5\' 2"  (1.575 m)  Wt 206 lb (93.441 kg)  BMI 37.67 kg/m2  SpO2 94%  General:  Chronically ill-appearing in no distress  Chest:   Breath sounds diminished at bases, no wheezes or rhonchi  CV:   Tachycardic with a regular rate and rhythm  Incisions:  Clean and dry and healing nicely  Abdomen:  Soft  nontender  Extremities:  Severe bilateral lower extremity edema, noticeably considerably worse than at the time of hospital discharge last week. There are associated skin changes and erythema consistent with chronic venous insufficiency and possible developing cellulitis. There is blister formation in the middle of the right lower leg.  Diagnostic Tests:   CHEST - 1 VIEW  COMPARISON: DG CHEST 2 VIEW dated 05/26/2013; DG CHEST 2 VIEW dated  05/23/2013; DG CHEST 1V PORT dated 05/22/2013; DG CHEST 2 VIEW dated  03/20/2013; CT ANGIO CHEST AORTA W/CM & WO/CM dated 05/07/2013; DG  CHEST 1V PORT dated 05/17/2013  FINDINGS:  There is stable cardiomegaly. Mitral valve replacement visible and  stable in appearance lungs show some chronic opacity in the right  lower lung zone which has essentially been stable following cardiac  surgery. No overt edema is identified. There is no evidence of  significant pleural fluid or pneumothorax.  IMPRESSION:  Stable cardiomegaly and postoperative right lower lung opacity which  is likely a combination of atelectasis and scarring.  Electronically Signed  By: Aletta Edouard M.D.  On: 06/04/2013 11:20   Impression:  The patient appears to have acute exacerbation of chronic diastolic congestive heart failure and right-sided heart failure. She also remains in atrial fibrillation with somewhat elevated heart rate. She has low-grade fever and developing cellulitis in both lower legs. I think she would best be treated as an inpatient for aggressive intravenous diuresis and possible IV antibiotics.  Plan:  Situation discussed over the telephone with Dr. Aundra Dubin.  The patient will be admitted directly to the advanced heart failure treatment team at [facility].   Valentina Gu. Roxy Manns, MD 06/04/2013 11:55 AM

## 2013-06-04 NOTE — ED Notes (Signed)
Pt turned on left side and repositioned upright in bed.

## 2013-06-04 NOTE — ED Provider Notes (Signed)
CSN: [id number]     Arrival date & time 06/04/13  1238 History   First MD Initiated Contact with Patient 06/04/13 1257     Chief Complaint  Patient presents with  . Leg Swelling     (Consider location/radiation/quality/duration/timing/severity/associated sxs/prior Treatment) HPI Comments: Pt seen by CT surgery today Dr. Roxy Manns s/p mitral valve replacement about 1 week ago for worsening leg swelling.  Pt unknown how much weight she has gained but has been taking all meds.  In the office pt noted to be febrile to 101.5 and tachy to 120.  Felt that she may have cellulitis to the lower ext and was supposed to be a direct admit but no beds available and was sent here for further care.  The history is provided by the patient.    Past Medical History  Diagnosis Date  . Chronic airway obstruction, not elsewhere classified   . Precordial pain   . Swelling of limb   . Dizziness and giddiness   . Unspecified transient cerebral ischemia     Diag. 2003  . Acute, but ill-defined, cerebrovascular disease     2000 [place]  . Shortness of breath   . Obesity   . Juvenile rheumatic fever     age 83  . Unspecified essential hypertension   . Hyperlipidemia   . Mitral regurgitation 02/16/2013  . Chronic kidney disease   . CHF (congestive heart failure) 04/23/2013  . Paroxysmal atrial fibrillation 02/16/2013    Recurrent paroxysmal atrial fibrillation, first diagnosed December 2014  . Chronic diastolic congestive heart failure   . Arthritis     Chronic left knee pain  . Aortic insufficiency due to bicuspid aortic valve     moderate by TEE  . 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 S/P Maze operation for atrial fibrillation 05/16/2013    Complete bilateral atrial lesion set using cryothermy and bipolar radiofrequency ablation with oversewing of LA  appendage   Past Surgical History  Procedure Laterality Date  . Total knee arthroplasty Right   . Abdominal hysterectomy      Cervical Cancer  . Parathyroid/thyroid surgery      tumor  . Tee without cardioversion N/A 04/06/2013    Procedure: TRANSESOPHAGEAL ECHOCARDIOGRAM (TEE);  Surgeon: [name], MD;  Location: [facility];  Service: Cardiology;  Laterality: N/A;  . Minimally invasive maze procedure N/A 05/16/2013    Procedure: MINIMALLY INVASIVE MAZE PROCEDURE;  Surgeon: Rexene Alberts, MD;  Location: [place];  Service: Open Heart Surgery;  Laterality: N/A;  . Intraoperative transesophageal echocardiogram N/A 05/16/2013    Procedure: INTRAOPERATIVE TRANSESOPHAGEAL ECHOCARDIOGRAM;  Surgeon: Rexene Alberts, MD;  Location: [place];  Service: Open Heart Surgery;  Laterality: N/A;  . Mitral valve replacement Right 05/16/2013    Procedure: MINIMALLY INVASIVE MITRAL VALVE (MV) REPLACEMENT;  Surgeon: Rexene Alberts, MD;  Location: [place] [facility];  Service: Open Heart Surgery;  Laterality: Right;   Family History  Problem Relation Age of Onset  . Heart failure Father   . Heart attack Brother    History  Substance Use Topics  . Smoking status: Never Smoker   . Smokeless tobacco: Never Used  . Alcohol Use: No   OB History   Grav Para Term Preterm Abortions TAB SAB Ect Mult Living  Review of Systems  Constitutional: Positive for fever.  Respiratory: Negative for cough, chest tightness and shortness of breath.   Cardiovascular: Positive for leg swelling.  Genitourinary: Negative for dysuria.  All other systems reviewed and are negative.      Allergies  Indomethacin and Norvasc  Home Medications   Current Outpatient Rx  Name  Route  Sig  Dispense  Refill  . acetaminophen (TYLENOL) 325 MG tablet   Oral   Take 650 mg by mouth every 6 (six) hours as needed for moderate pain.         Marland Kitchen amiodarone (PACERONE) 200 MG tablet   Oral   Take 1 tablet (200 mg total)  by mouth daily.         Marland Kitchen aspirin EC 81 MG tablet   Oral   Take 81 mg by mouth daily.         . Calcium Carbonate-Vitamin D (CALTRATE 600+D PO)   Oral   Take 1 tablet by mouth daily. Soft chew         . furosemide (LASIX) 40 MG tablet   Oral   Take 2 tablets (80 mg total) by mouth 2 (two) times daily.   90 tablet   6   . guaiFENesin (MUCINEX) 600 MG 12 hr tablet   Oral   Take 1 tablet (600 mg total) by mouth every 12 (twelve) hours as needed for cough.         . iron polysaccharides (NIFEREX) 150 MG capsule   Oral   Take 1 capsule (150 mg total) by mouth daily. For one month then stop.         . Loperamide HCl (IMODIUM PO)   Oral   Take by mouth daily as needed. After each loose stools         . metoprolol tartrate (LOPRESSOR) 25 MG tablet   Oral   Take 0.5 tablets (12.5 mg total) by mouth 2 (two) times daily.   60 tablet   6   . potassium chloride SA (K-DUR,KLOR-CON) 20 MEQ tablet   Oral   Take 1 tablet (20 mEq total) by mouth daily.   60 tablet   6   . pravastatin (PRAVACHOL) 20 MG tablet   Oral   Take 20 mg by mouth at bedtime.          . solifenacin (VESICARE) 5 MG tablet   Oral   Take 5 mg by mouth daily.           Marland Kitchen spironolactone (ALDACTONE) 25 MG tablet   Oral   Take 1 tablet (25 mg total) by mouth daily.         . traMADol (ULTRAM) 50 MG tablet   Oral   Take 1 tablet (50 mg total) by mouth every 12 (twelve) hours as needed for moderate pain.   30 tablet   0   . warfarin (COUMADIN) 2 MG tablet      2 MG Monday, Wednesday, and Saturday and 3 MG all other days.   30 tablet   1    BP 90/53  Pulse 117  Temp(Src) 98.4 F (36.9 C) (Oral)  Resp 21  Ht 5\' 5"  (1.651 m)  Wt 210 lb (95.255 kg)  BMI 34.95 kg/m2  SpO2 98% Physical Exam  Nursing note and vitals reviewed. Constitutional: She is oriented to person, place, and time. She appears well-developed and well-nourished. No distress.  HENT:  Head: Normocephalic and  atraumatic.  Mouth/Throat: Oropharynx is  clear and moist.  Eyes: Conjunctivae and EOM are normal. Pupils are equal, round, and reactive to light.  Neck: Normal range of motion. Neck supple.  Cardiovascular: Intact distal pulses.  An irregularly irregular rhythm present. Tachycardia present.   No murmur heard. Pulmonary/Chest: Effort normal and breath sounds normal. No respiratory distress. She has no wheezes. She has no rales.  Healing incisions on the chest  Abdominal: Soft. She exhibits no distension. There is no tenderness. There is no rebound and no guarding.  Musculoskeletal: Normal range of motion. She exhibits edema. She exhibits no tenderness.  3+ edema of the lower ext with mild blistering on the right shin.  No warmth to the skin but mild redness over the shins bilaterally  Neurological: She is alert and oriented to person, place, and time.  Skin: Skin is warm and dry. No rash noted. No erythema.  Psychiatric: She has a normal mood and affect. Her behavior is normal.    ED Course  Procedures (including critical care time) Labs Review Labs Reviewed  CBC - Abnormal; Notable for the following:    RBC 2.99 (*)    Hemoglobin 9.5 (*)    HCT 30.1 (*)    MCV 100.7 (*)    RDW 17.9 (*)    All other components within normal limits  BASIC METABOLIC PANEL - Abnormal; Notable for the following:    BUN 33 (*)    Creatinine, Ser 1.68 (*)    GFR calc non Af Amer 29 (*)    GFR calc Af Amer 34 (*)    All other components within normal limits  PRO B NATRIURETIC PEPTIDE - Abnormal; Notable for the following:    Pro B Natriuretic peptide (BNP) 5763.0 (*)    All other components within normal limits  PROTIME-INR - Abnormal; Notable for the following:    Prothrombin Time 28.0 (*)    INR 2.73 (*)    All other components within normal limits  I-STAT TROPOININ, ED - Abnormal; Notable for the following:    Troponin i, poc 0.10 (*)    All other components within normal limits   Imaging  Review Dg Chest 1 View  06/04/2013   CLINICAL DATA:  Shortness of breath. Status post mitral valve replacement and Maze procedure on 05/16/2013.  EXAM: CHEST - 1 VIEW  COMPARISON:  DG CHEST 2 VIEW dated 05/26/2013; DG CHEST 2 VIEW dated 05/23/2013; DG CHEST 1V PORT dated 05/22/2013; DG CHEST 2 VIEW dated 03/20/2013; CT ANGIO CHEST AORTA W/CM & WO/CM dated 05/07/2013; DG CHEST 1V PORT dated 05/17/2013  FINDINGS: There is stable cardiomegaly. Mitral valve replacement visible and stable in appearance lungs show some chronic opacity in the right lower lung zone which has essentially been stable following cardiac surgery. No overt edema is identified. There is no evidence of significant pleural fluid or pneumothorax.  IMPRESSION: Stable cardiomegaly and postoperative right lower lung opacity which is likely a combination of atelectasis and scarring.   Electronically Signed   By: Aletta Edouard M.D.   On: 06/04/2013 11:20     EKG Interpretation   Date/Time:  Monday June 04 2013 12:43:24 EDT Ventricular Rate:  120 PR Interval:    QRS Duration: 110 QT Interval:  348 QTC Calculation: 491 R Axis:   -18 Text Interpretation:  Atrial fibrillation with rapid ventricular response  Incomplete left bundle branch block ST \\T \ T wave abnormality, consider  inferolateral ischemia Confirmed by Maryan Rued  MD, Harshini Trent ([id number]) on  06/04/2013 1:12:16 PM  MDM   Final diagnoses:  None   Patient presenting from CT surgery office with worsening leg swelling, tachycardia and fever after a mitral valve replacement approximately one week ago. The patient denies any shortness of breath but states over the last few days she's had worsening swelling in her lower extremities despite taking her Lasix regularly. Today she is found to be in A. fib RVR with heart rate in the 120s despite taking her amiodarone and metoprolol. Her blood pressure ranges between 100s and upper 90s. Temperature today was the first one she noted upon a  1.5. She does have mild redness to her lower extremities but significant edema and no warmth to the skin. She has a normal white blood cell count and denies any chest pain or shortness of breath. Patient was supposed to be a direct admit to cardiology however there are no telemetry beds available so she was sent to the emergency room.  Patient has a mild troponin leak 0.1, EKG with A. fib RVR, stable hemoglobin and elevated BNP today of 5700.  Patient's creatinine today is stable at 1.68. She was given 5 mg of Lopressor IV and cardiology was called for further treatment. Also will give patient a diabetes of Lasix.    Blanchie Dessert, MD 06/04/13 [phone]

## 2013-06-04 NOTE — ED Notes (Signed)
Pt reports that she had a heart valve replaced last Wednesday by Dr. Ricard Dillon. States that over the past couple of days she has had a large amount of swelling in bilateral legs. Denies any pain a this time. Bruising noted to chest post surgery. States she was sent here by MD for further evaluation.

## 2013-06-05 ENCOUNTER — Inpatient Hospital Stay (HOSPITAL_COMMUNITY): Payer: Medicare HMO

## 2013-06-05 ENCOUNTER — Encounter (HOSPITAL_COMMUNITY): Payer: Medicare HMO

## 2013-06-05 DIAGNOSIS — N179 Acute kidney failure, unspecified: Secondary | ICD-10-CM

## 2013-06-05 DIAGNOSIS — I359 Nonrheumatic aortic valve disorder, unspecified: Secondary | ICD-10-CM

## 2013-06-05 LAB — BASIC METABOLIC PANEL
BUN: 36 mg/dL — AB (ref 6–23)
BUN: 38 mg/dL — ABNORMAL HIGH (ref 6–23)
CALCIUM: 8.9 mg/dL (ref 8.4–10.5)
CHLORIDE: 97 meq/L (ref 96–112)
CO2: 28 meq/L (ref 19–32)
CO2: 28 meq/L (ref 19–32)
Calcium: 8.8 mg/dL (ref 8.4–10.5)
Chloride: 99 mEq/L (ref 96–112)
Creatinine, Ser: 1.85 mg/dL — ABNORMAL HIGH (ref 0.50–1.10)
Creatinine, Ser: 1.8 mg/dL — ABNORMAL HIGH (ref 0.50–1.10)
GFR calc Af Amer: 30 mL/min — ABNORMAL LOW (ref >90)
GFR calc Af Amer: 31 mL/min — ABNORMAL LOW (ref >90)
GFR calc non Af Amer: 26 mL/min — ABNORMAL LOW (ref >90)
GFR calc non Af Amer: 27 mL/min — ABNORMAL LOW (ref >90)
GLUCOSE: 94 mg/dL (ref 70–99)
Glucose, Bld: 133 mg/dL — ABNORMAL HIGH (ref 70–99)
Potassium: 5.1 mEq/L (ref 3.7–5.3)
Potassium: 4.6 mEq/L (ref 3.7–5.3)
SODIUM: 139 meq/L (ref 137–147)
Sodium: 137 mEq/L (ref 137–147)

## 2013-06-05 LAB — GLUCOSE, CAPILLARY
GLUCOSE-CAPILLARY: 108 mg/dL — AB (ref 70–99)
GLUCOSE-CAPILLARY: 108 mg/dL — AB (ref 70–99)
Glucose-Capillary: 129 mg/dL — ABNORMAL HIGH (ref 70–99)

## 2013-06-05 LAB — PROTIME-INR
INR: 3.25 — ABNORMAL HIGH (ref 0.00–1.49)
Prothrombin Time: 32 seconds — ABNORMAL HIGH (ref 11.6–15.2)

## 2013-06-05 MED ORDER — PERFLUTREN LIPID MICROSPHERE
1.0000 mL | INTRAVENOUS | Status: AC | PRN
  Filled 2013-06-05: qty 10

## 2013-06-05 MED ORDER — SODIUM CHLORIDE 0.9 % IJ SOLN
3.0000 mL | Freq: Two times a day (BID) | INTRAMUSCULAR | Status: DC
  Administered 2013-06-05 – 2013-06-09 (×4): 3 mL via INTRAVENOUS
  Administered 2013-06-10: 21:00:00 via INTRAVENOUS

## 2013-06-05 MED ORDER — PERFLUTREN LIPID MICROSPHERE
INTRAVENOUS | Status: AC
  Administered 2013-06-05: 2 mL
  Filled 2013-06-05: qty 10

## 2013-06-05 MED ORDER — WARFARIN SODIUM 1 MG PO TABS
1.0000 mg | ORAL_TABLET | Freq: Once | ORAL | Status: AC
  Administered 2013-06-05: 1 mg via ORAL
  Filled 2013-06-05: qty 1

## 2013-06-05 MED ORDER — SPIRONOLACTONE 25 MG PO TABS
25.0000 mg | ORAL_TABLET | Freq: Every day | ORAL | Status: DC
  Administered 2013-06-05 – 2013-06-13 (×8): 25 mg via ORAL
  Filled 2013-06-05 (×9): qty 1

## 2013-06-05 MED ORDER — SODIUM CHLORIDE 0.9 % IV SOLN: 250.0000 mL | INTRAVENOUS | Status: DC

## 2013-06-05 MED ORDER — SODIUM CHLORIDE 0.9 % IJ SOLN
3.0000 mL | INTRAMUSCULAR | Status: DC | PRN
  Administered 2013-06-09: 3 mL via INTRAVENOUS

## 2013-06-05 NOTE — H&P (Signed)
Advanced Heart Failure Team History and Physical Note   Primary Physician: Dr. Quintin Alto  Primary Cardiologist: Dr. Harl Bowie  Reason for Admission: A/C HF  Baseline proBNP: 460    HPI:    Ms. [name] [name] is a 73 yo obese white female with known history of rheumatic fever during childhood and long standing history of chronic diastolic congestive heart failure. She also has a history of CKD, HTN and PAF. The patient has been admitted several times with recurrent exacerbations of CHF. Progressed to severe symptomatic Mitral Regurgitation with Paroxysmal Atrial Fibrillation and underwent minimally invasive maze procedure with MV replacement (05/16/13). Was discharged 05/31/13 to [facility] center and weight was 207 lbs.   Presented to post-op visit with Dr. Roxy Manns this am. +orthopnea, LE edema, diarrhea and low grade fever.   Review of Systems: [y] = yes, [ ]  = no   General: Weight gain [Y ]; Weight loss [ ] ; Anorexia [ ] ; Fatigue [Y ]; Fever [ Y]; Chills [ ] ; Weakness [ ]   Cardiac: Chest pain/pressure [ ] ; Resting SOB [ ] ; Exertional SOB [Y ]; Orthopnea [Y ]; Pedal Edema [Y ]; Palpitations [ ] ; Syncope [ ] ; Presyncope [ ] ; Paroxysmal nocturnal dyspnea[ ]   Pulmonary: Cough [ ] ; Wheezing[ ] ; Hemoptysis[ ] ; Sputum [ ] ; Snoring [ ]   GI: Vomiting[ ] ; Dysphagia[ ] ; Melena[ ] ; Hematochezia [ ] ; Heartburn[ ] ; Abdominal pain [ ] ; Constipation [ ] ; Diarrhea [ ] ; BRBPR [ ]   GU: Hematuria[ ] ; Dysuria [ ] ; Nocturia[ ]   Vascular: Pain in legs with walking [ ] ; Pain in feet with lying flat [ ] ; Non-healing sores [ ] ; Stroke [ ] ; TIA [ ] ; Slurred speech [ ] ;  Neuro: Headaches[ ] ; Vertigo[ ] ; Seizures[ ] ; Paresthesias[ ] ;Blurred vision [ ] ; Diplopia [ ] ; Vision changes [ ]   Ortho/Skin: Arthritis [ ] ; Joint pain [Y ]; Muscle pain [ ] ; Joint swelling [ ] ; Back Pain [ ] ; Rash [ ]   Psych: Depression[ ] ; Anxiety[ ]   Heme: Bleeding problems [ ] ; Clotting disorders [ ] ; Anemia [ ]   Endocrine: Diabetes [ ] ; Thyroid dysfunction[  ]  Home Medications Prior to Admission medications   Medication Sig Start Date End Date Taking? Authorizing Provider  acetaminophen (TYLENOL) 325 MG tablet Take 650 mg by mouth every 6 (six) hours as needed for moderate pain.   Yes Historical Provider, MD  amiodarone (PACERONE) 200 MG tablet Take 1 tablet (200 mg total) by mouth daily. 05/30/13  Yes Donielle Liston Alba, PA-C  aspirin EC 81 MG tablet Take 81 mg by mouth daily.   Yes Historical Provider, MD  calcium-vitamin D (OSCAL WITH D) 500-200 MG-UNIT per tablet Take 1 tablet by mouth daily.   Yes Historical Provider, MD  furosemide (LASIX) 40 MG tablet Take 2 tablets (80 mg total) by mouth 2 (two) times daily. 05/30/13  Yes Donielle Liston Alba, PA-C  guaiFENesin (MUCINEX) 600 MG 12 hr tablet Take 1 tablet (600 mg total) by mouth every 12 (twelve) hours as needed for cough. 05/30/13  Yes Donielle Liston Alba, PA-C  Loperamide HCl (IMODIUM PO) Take 1 tablet by mouth daily as needed (for loose stools).    Yes Historical Provider, MD  metoprolol tartrate (LOPRESSOR) 25 MG tablet Take 0.5 tablets (12.5 mg total) by mouth 2 (two) times daily. 05/30/13  Yes Donielle Liston Alba, PA-C  oxybutynin (DITROPAN-XL) 5 MG 24 hr tablet Take 5 mg by mouth at bedtime.   Yes Historical Provider, MD  potassium chloride SA (K-DUR,KLOR-CON) 20 MEQ tablet  Take 1 tablet (20 mEq total) by mouth daily. 05/30/13  Yes Donielle Liston Alba, PA-C  pravastatin (PRAVACHOL) 20 MG tablet Take 20 mg by mouth at bedtime.    Yes Historical Provider, MD  spironolactone (ALDACTONE) 25 MG tablet Take 1 tablet (25 mg total) by mouth daily. 05/30/13  Yes Donielle Liston Alba, PA-C  traMADol (ULTRAM) 50 MG tablet Take 1 tablet (50 mg total) by mouth every 12 (twelve) hours as needed for moderate pain. 05/30/13  Yes Donielle Liston Alba, PA-C  warfarin (COUMADIN) 2 MG tablet 2 MG Monday, Wednesday, and Saturday and 3 MG all other days. 06/01/13  Yes [name], MD  iron polysaccharides  (NIFEREX) 150 MG capsule Take 1 capsule (150 mg total) by mouth daily. For one month then stop. 05/30/13   Donielle Liston Alba, PA-C    Past Medical History: Past Medical History  Diagnosis Date  . Chronic airway obstruction, not elsewhere classified   . Precordial pain   . Swelling of limb   . Dizziness and giddiness   . Unspecified transient cerebral ischemia     Diag. 2003  . Acute, but ill-defined, cerebrovascular disease     2000 [place]  . Shortness of breath   . Obesity   . Juvenile rheumatic fever     age 17  . Unspecified essential hypertension   . Hyperlipidemia   . Mitral regurgitation 02/16/2013  . Chronic kidney disease   . CHF (congestive heart failure) 04/23/2013  . Paroxysmal atrial fibrillation 02/16/2013    Recurrent paroxysmal atrial fibrillation, first diagnosed December 2014  . Chronic diastolic congestive heart failure   . Arthritis     Chronic left knee pain  . Aortic insufficiency due to bicuspid aortic valve     moderate by TEE  . 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 S/P Maze operation for atrial fibrillation 05/16/2013    Complete bilateral atrial lesion set using cryothermy and bipolar radiofrequency ablation with oversewing of LA appendage    Past Surgical History: Past Surgical History  Procedure Laterality Date  . Total knee arthroplasty Right   . Abdominal hysterectomy      Cervical Cancer  . Parathyroid/thyroid surgery      tumor  . Tee without cardioversion N/A 04/06/2013    Procedure: TRANSESOPHAGEAL ECHOCARDIOGRAM (TEE);  Surgeon: [name], MD;  [facility];  Service: Cardiology;  Laterality: N/A;  . Minimally invasive maze procedure N/A 05/16/2013    Procedure: MINIMALLY INVASIVE MAZE PROCEDURE;  Surgeon: Rexene Alberts, MD;  Location: [place];  Service: Open Heart  Surgery;  Laterality: N/A;  . Intraoperative transesophageal echocardiogram N/A 05/16/2013    Procedure: INTRAOPERATIVE TRANSESOPHAGEAL ECHOCARDIOGRAM;  Surgeon: Rexene Alberts, MD;  Location: [place];  Service: Open Heart Surgery;  Laterality: N/A;  . Mitral valve replacement Right 05/16/2013    Procedure: MINIMALLY INVASIVE MITRAL VALVE (MV) REPLACEMENT;  Surgeon: Rexene Alberts, MD;  Location: [place];  Service: Open Heart Surgery;  Laterality: Right;    Family History: Family History  Problem Relation Age of Onset  . Heart failure Father   . Heart attack Brother     Social History: History   Social History  . Marital Status: Widowed    Spouse Name: N/A    Number of Children: N/A  . Years of Education: N/A  Social History Main Topics  . Smoking status: Never Smoker   . Smokeless tobacco: Never Used  . Alcohol Use: No  . Drug Use: No  . Sexual Activity: None   Other Topics Concern  . 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 Indomethacin Other (See Comments)    dizziness  . Norvasc [Amlodipine Besylate] Cough    Objective:    Vital Signs:   Temp:  [97.3 F (36.3 C)-98.5 F (36.9 C)] 97.6 F (36.4 C) (03/31 1136) Pulse Rate:  [95-115] 102 (03/31 1136) Resp:  [14-26] 25 (03/31 1136) BP: (82-113)/(32-70) 105/59 mmHg (03/31 1136) SpO2:  [90 %-100 %] 95 % (03/31 1136) Weight:  [203 lb 7.8 oz (92.3 kg)-206 lb 9.1 oz (93.7 kg)] 203 lb 7.8 oz (92.3 kg) (03/31 0348) Last BM Date: 06/04/13 Filed Weights   06/04/13 1246 06/04/13 1630 06/05/13 0348  Weight: 210 lb (95.255 kg) 206 lb 9.1 oz (93.7 kg) 203 lb 7.8 oz (92.3 kg)    Physical Exam: General: Chronically ill appearing. No resp difficulty  HEENT: normal  Neck: supple. JVP 12. Carotids 2+ bilat; no bruits. No lymphadenopathy or thryomegaly appreciated.  Cor: PMI  nondisplaced. Tachy rate & regular rhythm. No rubs, gallops or murmurs.  Lungs: clear  Abdomen: soft, nontender, +distended. No hepatosplenomegaly. No bruits or masses. Good bowel sounds.  Extremities: no cyanosis, clubbing, rash, bilateral LE 2-3+ edema to thighs with erythema  Neuro: alert & orientedx3, cranial nerves grossly intact. moves all 4 extremities w/o difficulty. Affect pleasant  Telemetry: afib with RVR  Labs: Basic Metabolic Panel:  Recent Labs Lab 05/30/13 0341 06/04/13 1248 06/05/13 0340  NA 141 141 137  K 4.7 4.6 5.1  CL 99 100 97  CO2 33* 28 28  GLUCOSE 103* 96 94  BUN 50* 33* 36*  CREATININE 1.48* 1.68* 1.85*  CALCIUM 8.9 9.0 8.8  MG 1.9  --   --     Liver Function Tests: No results found for this basename: AST, ALT, ALKPHOS, BILITOT, PROT, ALBUMIN,  in the last 168 hours No results found for this basename: LIPASE, AMYLASE,  in the last 168 hours No results found for this basename: AMMONIA,  in the last 168 hours  CBC:  Recent Labs Lab 06/04/13 1248  WBC 6.0  HGB 9.5*  HCT 30.1*  MCV 100.7*  PLT 239    Cardiac Enzymes: No results found for this basename: CKTOTAL, CKMB, CKMBINDEX, TROPONINI,  in the last 168 hours  BNP: BNP (last 3 results)  Recent Labs  05/25/13 0402 05/28/13 0528 06/04/13 1248  PROBNP 5322.0* 3515.0* 5763.0*    CBG:  Recent Labs Lab 06/04/13 1719 06/04/13 2112 06/05/13 0838 06/05/13 1203  GLUCAP 99 124* 108* 108*    Coagulation Studies:  Recent Labs  06/04/13 1248 06/05/13 0525  LABPROT 28.0* 32.0*  INR 2.73* 3.25*    Other results: EKG: afib with RVR 115 bpm  Imaging: Dg Chest 1 View  06/04/2013   CLINICAL DATA:  Shortness of breath. Status post mitral valve replacement and Maze procedure on 05/16/2013.  EXAM: CHEST - 1 VIEW  COMPARISON:  DG CHEST 2 VIEW dated 05/26/2013; DG CHEST 2 VIEW dated 05/23/2013; DG CHEST 1V PORT dated 05/22/2013; DG CHEST 2 VIEW dated 03/20/2013; CT ANGIO CHEST AORTA W/CM &  WO/CM dated 05/07/2013; DG CHEST 1V PORT dated 05/17/2013  FINDINGS: There is stable cardiomegaly. Mitral valve replacement visible and stable in appearance lungs show some chronic opacity in the right lower lung zone which has essentially been stable following cardiac surgery. No overt edema is identified. There is no evidence of significant pleural fluid or pneumothorax.  IMPRESSION: Stable cardiomegaly and postoperative right lower lung opacity which is likely a combination of atelectasis and scarring.   Electronically Signed   By: Aletta Edouard M.D.   On: 06/04/2013 11:20   Dg Chest 2 View  06/05/2013   CLINICAL DATA:  Short of breath.  Weakness.  EXAM: CHEST  2 VIEW  COMPARISON:  DG CHEST 1 VIEW dated 06/04/2013; DG CHEST 2 VIEW dated 05/26/2013; DG CHEST 2 VIEW dated 05/23/2013; DG CHEST 2 VIEW dated 03/20/2013  FINDINGS: Cardiomegaly is present, stable. Bioprosthetic mitral valve replacement. Opacity is present at the right lung base, slightly improved compared to the recent prior examination yesterday. This may represent improving asymmetric pulmonary edema with superimposed airspace disease/pneumonia. Small pleural effusion is present with blunting of the right costophrenic angle. Monitoring leads project over the chest.  IMPRESSION: Mild improvement in airspace disease at the right lung base. Small right pleural effusion.   Electronically Signed   By: Dereck Ligas M.D.   On: 06/05/2013 08:26         Assessment:   1) A/C diastolic HF  2) Afib with RVR  3) HTN  4) CKD   Plan/Discussion:    Ms. Coggin presented to the ED after her TCTS appointment. She is markedly volume overloaded. Will admit for diuresis. Will order 80 mg IV BID. Repeat ECHO, concerned that she may need milrinone to facilitate with her diuresis, however will not use currently with her being in afib with RVR. Will give amiodarone bolus and continue gtt. She is on coumadin will have pharmacy dose.    Length of Stay:  1  Rande Brunt NP-C 06/05/2013, 3:30 PM  Advanced Heart Failure Team Pager [phone] (M-F; [place])  Please contact [facility] for night-coverage after hours (4p -7a ) and weekends on [email or web address]  Patient seen with NP, agree with the above note. She returns to the hospital very volume overloaded post-MV repair and Maze. She is in atrial fibrillation.  1. Acute on chronic diastolic CHF: She is very volume overloaded.  - Lasix 80 mg IV every 8 hrs  - Will get echo to assess LV/RV fxn  2. CKD: Creatinine at baseline. Follow closely with diuresis.  3. Atrial fibrillation: She is back in atrial fibrillation. Has been in and out of fibrillation since surgery, think she was probably in fibrillation at discharge. Will start amiodarone gtt (has been on po amiodarone). Continue coumadin. Will consider TEE-guided DCCV if she remains in afib, think she will benefit from NSR.  4. Cellulitis: Possible lower extremity cellulitis (significant erythema). T = 101.5 at arrival. WBCs not elevated. Will treat with vancomycin for now.  5. Diarrhea: Send stool for c difficile.  [name]  06/04/2013  4:21 PM

## 2013-06-05 NOTE — Clinical Documentation Improvement (Signed)
[facility] MD's, NP's, and PA's  Possible Clinical Conditions?   Patient with GFR, (est Non AF Am) of 26 L and 29 L  , being documented "CKD"  if appropriate please document the stage of the "CKD".  Thank you    CKD Stage III - GFR 30-59  CKD Stage IV - GFR 15-29  CKD Stage V - GFR < 15  Other condition  Cannot Clinically determine     Treatment: BMET daily  Thank You, Ree Kida ,RN Clinical Documentation Specialist:  [employer]

## 2013-06-05 NOTE — Progress Notes (Signed)
Echocardiogram 2D Echocardiogram with Definity has been performed.  Zadiel Leyh 06/05/2013, 1:18 PM

## 2013-06-05 NOTE — Evaluation (Addendum)
Physical Therapy Evaluation Patient Details Name: [name] [name] MRN: [id number] DOB: 05-Dec-1940 Today's Date: 06/05/2013   History of Present Illness  Patient is a 73 year old obese widowed white female from [place] with remote history of rheumatic fever during childhood and long-standing history of chronic diastolic congestive heart failure who has recently experienced multiple recurrent episodes of acute exacerbation of chronic diastolic congestive heart failure.  She has been diagnosed with severe, symptomatic mitral regurgitation with recurrent paroxysmal atrial fibrillation S/P minimally invasive MVR. Pt DC to [facility] and returned to hospital  3/30 with bil LE edema and CHF  Clinical Impression  Pt very familiar from prior admission with noticeable increase in edema and erythema of bil LE as well as decreased activity tolerance and ability with transfers. Pt with below deficits who will benefit from acute therapy to maximize mobility, function and strength prior to DC to decrease burden of care. Recommend OOB to chair daily with staff.     Follow Up Recommendations SNF    Equipment Recommendations  None recommended by PT    Recommendations for Other Services       Precautions / Restrictions Precautions Precautions: Fall      Mobility  Bed Mobility Overal bed mobility: Needs Assistance Bed Mobility: Supine to Sit     Supine to sit: Mod assist     General bed mobility comments: pt able to pivot legs to EOB with increased time but mod assist to elevate trunk from surface  Transfers Overall transfer level: Needs assistance   Transfers: Sit to/from Stand Sit to Stand: Min assist         General transfer comment: cues for hand placement and to fully back to chair. Min assist for reciprocal scooting with cues  Ambulation/Gait Ambulation/Gait assistance: Min guard Ambulation Distance (Feet): 52 Feet Assistive device: Rolling walker (2 wheeled) Gait  Pattern/deviations: Step-through pattern;Decreased stride length;Trunk flexed   Gait velocity interpretation: <1.8 ft/sec, indicative of risk for recurrent falls General Gait Details: cues for posture, looking up  Stairs            Wheelchair Mobility    Modified Rankin (Stroke Patients Only)       Balance Overall balance assessment: Needs assistance   Sitting balance-[name] Scale: Fair       Standing balance-[name] Scale: Poor                       Pertinent Vitals/Pain No pain HR 102 sats 95% on 1L     Home Living Family/patient expects to be discharged to:: Skilled nursing facility Living Arrangements: Other relatives;Children Available Help at Discharge: Family;Available 24 hours/day Type of Home: House Home Access: Stairs to enter Entrance Stairs-Rails: Right Entrance Stairs-Number of Steps: 2 Home Layout: One level Home Equipment: None Additional Comments: Pt did not use O2 at home PTA.     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 Since multiple hospitalizations has been walking grossly 150' with RW and assist for all ADLs     Hand Dominance   Dominant Hand: Right    Extremity/Trunk Assessment   Upper Extremity Assessment: Generalized weakness           Lower Extremity Assessment: Generalized weakness   LLE Deficits / Details: limited by OA needs tKA  Cervical / Trunk Assessment: Normal  Communication   Communication: No difficulties  Cognition Arousal/Alertness: Awake/alert Behavior  During Therapy: WFL for tasks assessed/performed Overall Cognitive Status: Within Functional Limits for tasks assessed                      General Comments      Exercises        Assessment/Plan    PT Assessment Patient needs continued PT services  PT Diagnosis Difficulty walking;Generalized weakness   PT Problem List Decreased strength;Decreased activity  tolerance;Decreased mobility;Decreased balance;Decreased knowledge of use of DME;Cardiopulmonary status limiting activity;Obesity;Pain;Decreased range of motion  PT Treatment Interventions DME instruction;Gait training;Stair training;Functional mobility training;Therapeutic activities;Therapeutic exercise;Patient/family education   PT Goals (Current goals can be found in the Care Plan section) Acute Rehab PT Goals Patient Stated Goal: get back to walking PT Goal Formulation: With patient Time For Goal Achievement: 06/19/13 Potential to Achieve Goals: Fair    Frequency Min 2X/week   Barriers to discharge        End of Session Equipment Utilized During Treatment: Gait belt;Oxygen Activity Tolerance: Patient tolerated treatment well Patient left: in chair;with call bell/phone within reach         Time: 1103-1128 PT Time Calculation (min): 25 min   Charges:   PT Evaluation $Initial PT Evaluation Tier I: 1 Procedure PT Treatments $Therapeutic Activity: 8-22 mins   PT G Codes:          Melford Aase 06/05/2013, 11:45 AM Elwyn Reach, [place]

## 2013-06-05 NOTE — Progress Notes (Signed)
[address]       [place] [address]             [phone]     CARDIOTHORACIC SURGERY PROGRESS NOTE  Subjective: Feels better today.  No more diarrhea.  Denies SOB.  Objective: Vital signs in last 24 hours: Temp:  [97.3 F (36.3 C)-98.5 F (36.9 C)] 97.9 F (36.6 C) (03/31 1633) Pulse Rate:  [95-108] 102 (03/31 1136) Cardiac Rhythm:  [-] Atrial fibrillation (03/30 2300) Resp:  [16-26] 24 (03/31 1633) BP: (82-113)/(32-70) 97/56 mmHg (03/31 1633) SpO2:  [90 %-100 %] 100 % (03/31 1633) Weight:  [92.3 kg (203 lb 7.8 oz)] 92.3 kg (203 lb 7.8 oz) (03/31 0348)  Physical Exam:  Rhythm:   Aflutter w/ HR 100-110  Breath sounds: Diminished at bases  Heart sounds:  RRR  Incisions:  Clean and dry  Abdomen:  soft  Extremities:  Warm, severe bilateral LE edema but slightly improved, cellulitis both lower legs related to severe chronic venous insufficiency   Intake/Output from previous day: 03/30 0701 - 03/31 0700 In: 1132.1 [P.O.:360; I.V.:522.1; IV Piggyback:250] Out: 1300 [Urine:1300] Intake/Output this shift:    Lab Results:  Recent Labs  06/04/13 1248  WBC 6.0  HGB 9.5*  HCT 30.1*  PLT 239   BMET:  Recent Labs  06/05/13 0340 06/05/13 1425  NA 137 139  K 5.1 4.6  CL 97 99  CO2 28 28  GLUCOSE 94 133*  BUN 36* 38*  CREATININE 1.85* 1.80*  CALCIUM 8.8 8.9    CBG (last 3)   Recent Labs  06/04/13 2112 06/05/13 0838 06/05/13 1203  GLUCAP 124* 108* 108*   PT/INR:   Recent Labs  06/05/13 0525  LABPROT 32.0*  INR 3.25*     Transthoracic Echocardiography  (Report amended )  Patient: Sharne, Linders MR #: [id number] Study Date: 06/05/2013 Gender: F Age: 73 Height: 157.5cm Weight: 92.1kg BSA: 1.57m^2 Pt. Status: Room: 2H26C  ORDERING Barrett, Rob Hickman, Dalton SONOGRAPHER Tresa Res, RDCS ATTENDING [place], [place] REFERRING Junie Bame PERFORMING [facility],  Inpatient cc:  ------------------------------------------------------------ LV EF: 40% - 45%  ------------------------------------------------------------ Indications: CHF - 428.0.  ------------------------------------------------------------ History: PMH: Congestive heart failure. Aortic valve disease. Risk factors: Hypertension.  ------------------------------------------------------------ Study Conclusions  - Procedure narrative: Transthoracic echocardiography. Poor endocardial definition. Intravenous contrast (Definity) was administered. - Left ventricle: The cavity size was normal. Wall thickness was normal. Systolic function was mildly to moderately reduced. The estimated ejection fraction was in the range of 40% to 45%. LV diastolic function cannot be assessed due to the prosthetic mitral valve. - Aortic valve: Poorly visualized. Mildly calcified leaflets. Transvalvular velocity was minimally increased. Mild regurgitation. - Mitral valve: Bioprosthetic mitral valve. Leaflets not well-visualized. Appears to be stable. Peak and mean gradients of 14 and 6 mmHg across the valve. Trivial regurgitation. Valve area by continuity equation (using LVOT flow): 1.33cm^2. - Right ventricle: The cavity size was mildly dilated. Systolic function is mildly reduced. - Right atrium: The atrium was normal in size. - Tricuspid valve: Mild regurgitation. - Pulmonary arteries: PA peak pressure: 55mm Hg (S). - Inferior vena cava: The vessel was dilated; the respirophasic diameter changes were blunted (< 50%); findings are consistent with elevated central venous pressure. - Pericardium, extracardiac: There was no pericardial effusion.  ------------------------------------------------------------ Labs, prior tests, procedures, and surgery: Valve surgery. Mitral valve replacement.  Transthoracic echocardiography. M-mode, complete 2D, spectral Doppler, and color Doppler. Height:  Height: 157.5cm. Height: 62in. Weight:  Weight: 92.1kg. Weight: 202.6lb. Body mass index: BMI: 37.1kg/m^2. Body surface area: BSA: 1.4m^2. Blood pressure: 105/59. Patient status: Inpatient. Location: ICU/CCU  ------------------------------------------------------------  ------------------------------------------------------------ Left ventricle: The cavity size was normal. Wall thickness was normal. Systolic function was mildly to moderately reduced. The estimated ejection fraction was in the range of 40% to 45%. Distal anterior/apical hypokinesis and inferior hypokinesis. LV diastolic function cannot be assessed due to the prosthetic mitral valve.  ------------------------------------------------------------ Aortic valve: Poorly visualized. Mildly calcified leaflets. Doppler: Transvalvular velocity was minimally increased. Mild regurgitation. VTI ratio of LVOT to aortic valve: 0.77. Indexed valve area: 0.81cm^2/m^2 (VTI). Peak velocity ratio of LVOT to aortic valve: 0.71. Indexed valve area: 0.74cm^2/m^2 (Vmax). Mean gradient: 35mm Hg (S). Peak gradient: 35mm Hg (S).  ------------------------------------------------------------ Aorta: Aortic root: The aortic root was normal in size. Ascending aorta: The ascending aorta was normal in size.  ------------------------------------------------------------ Mitral valve: Bioprosthetic mitral valve. Leaflets not well-visualized. Appears to be stable. Peak and mean gradients of 14 and 6 mmHg across the valve. Doppler: Trivial regurgitation. Valve area by pressure half-time: 3.61cm^2. Indexed valve area by pressure half-time: 1.88cm^2/m^2. Valve area by continuity equation (using LVOT flow): 1.33cm^2. Indexed valve area by continuity equation (using LVOT flow): 0.69cm^2/m^2. Mean gradient: 29mm Hg (D). Peak gradient: 48mm Hg (D).  ------------------------------------------------------------ Left atrium: The atrium was at the upper limits  of normal in size.  ------------------------------------------------------------ Atrial septum: Poorly visualized.  ------------------------------------------------------------ Right ventricle: The cavity size was mildly dilated. Systolic function is mildly reduced.  ------------------------------------------------------------ Pulmonic valve: The valve appears to be grossly normal. Doppler: No significant regurgitation.  ------------------------------------------------------------ Tricuspid valve: Doppler: Mild regurgitation.  ------------------------------------------------------------ Pulmonary artery: The main pulmonary artery was normal-sized.  ------------------------------------------------------------ Right atrium: The atrium was normal in size.  ------------------------------------------------------------ Pericardium: There was no pericardial effusion.  ------------------------------------------------------------ Systemic veins: Inferior vena cava: The vessel was dilated; the respirophasic diameter changes were blunted (< 50%); findings are consistent with elevated central venous pressure.  ------------------------------------------------------------  2D measurements Normal Doppler measurements Normal Left ventricle Main pulmonary LVID ED, 49.5 mm 43-52 artery chord, Pressure, 34 mm Hg =30 PLAX S LVID ES, 44 mm 23-38 LVOT chord, Peak vel, 115 cm/s ------ PLAX S FS, 11 % >29 VTI, S 18.5 cm ------ chord, Peak 5 mm Hg ------ PLAX gradient, LVPW, ED 8.75 mm ------ S IVS/LVPW 1.1 <1.3 Aortic valve ratio, ED Peak vel, 163 cm/s ------ Vol ED, 81.8 ml ------ S MOD1 Mean vel, 106 cm/s ------ Vol ES, 49.9 ml ------ S MOD1 VTI, S 23.9 cm ------ EF, MOD1 38.95 % ------ Mean 6 mm Hg ------ Stroke 31.8 ml ------ gradient, vol, MOD1 S Vol 43 ml/m^2 ------ Peak 11 mm Hg ------ index, gradient, ED, MOD1 S Vol 26 ml/m^2 ------ VTI ratio 0.77 ------ index,  LVOT/AV ES, MOD1 Area index 0.81 cm^2/m ------ Stroke 16.6 ml/m^2 ------ (VTI) ^2 index, Peak vel 0.71 ------ MOD1 ratio, Ventricular septum LVOT/AV IVS, ED 9.59 mm ------ Area index 0.74 cm^2/m ------ LVOT (Vmax) ^2 Diam, S 16 mm ------ Mitral valve Area 2.01 cm^2 ------ Peak E vel 144 cm/s ------ Aorta Mean vel, 111 cm/s ------ Root 32 mm ------ D diam, ED Decelerati 220 ms 150-23 Left atrium on time 0 AP dim 39 mm ------ Pressure 61 ms ------ AP dim 2.03 cm/m^2 <2.2 half-time index Mean 6 mm Hg ------ gradient, D Peak 14 mm Hg ------ gradient, D Area (PHT) 3.61 cm^2 ------ Area index 1.88 cm^2/m ------ (PHT) ^2 Area 1.33 cm^2 ------ (LVOT) continuity  Area index 0.69 cm^2/m ------ (LVOT ^2 cont) Annulus 27.9 cm ------ VTI Tricuspid valve Regurg 257 cm/s ------ peak vel Peak RV-RA 26 mm Hg ------ gradient, S Systemic veins Estimated 8 mm Hg ------ CVP Right ventricle Pressure, 34 mm Hg <30 S  ------------------------------------------------------------ Amended  Hilty, Kenneth 2015-03-31T13:[id number]   Assessment/Plan:  Looks a little better. Appreciate care by advanced heart failure team Will continue to follow  Laveyah Oriol H 06/05/2013 4:53 PM

## 2013-06-05 NOTE — Progress Notes (Addendum)
ANTICOAGULATION CONSULT NOTE - Initial Consult  Pharmacy Consult for Coumadin Indication: atrial fibrillation  Allergies  Allergen Reactions  . Indomethacin Other (See Comments)    dizziness  . Norvasc [Amlodipine Besylate] Cough    Patient Measurements: Height: 5\' 2"  (157.5 cm) Weight: 203 lb 7.8 oz (92.3 kg) IBW/kg (Calculated) : 50.1   Vital Signs: Temp: 97.3 F (36.3 C) (03/31 0400) Temp src: Oral (03/31 0400) BP: 109/59 mmHg (03/31 0700) Pulse Rate: 102 (03/31 0700)  Labs:  Recent Labs  06/04/13 1248 06/05/13 0340 06/05/13 0525  HGB 9.5*  --   --   HCT 30.1*  --   --   PLT 239  --   --   LABPROT 28.0*  --  32.0*  INR 2.73*  --  3.25*  CREATININE 1.68* 1.85*  --     Estimated Creatinine Clearance: 29.1 ml/min (by C-G formula based on Cr of 1.85).   Medical History: Past Medical History  Diagnosis Date  . Chronic airway obstruction, not elsewhere classified   . Precordial pain   . Swelling of limb   . Dizziness and giddiness   . Unspecified transient cerebral ischemia     Diag. 2003  . Acute, but ill-defined, cerebrovascular disease     2000 [place]  . Shortness of breath   . Obesity   . Juvenile rheumatic fever     age 35  . Unspecified essential hypertension   . Hyperlipidemia   . Mitral regurgitation 02/16/2013  . Chronic kidney disease   . CHF (congestive heart failure) 04/23/2013  . Paroxysmal atrial fibrillation 02/16/2013    Recurrent paroxysmal atrial fibrillation, first diagnosed December 2014  . Chronic diastolic congestive heart failure   . Arthritis     Chronic left knee pain  . Aortic insufficiency due to bicuspid aortic valve     moderate by TEE  . 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 S/P Maze operation for atrial fibrillation 05/16/2013    Complete bilateral atrial lesion  set using cryothermy and bipolar radiofrequency ablation with oversewing of LA appendage      Assessment: 73yof admitted from SNF for wight gain, orthopnea, low grade fever, diarrhea.  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  She is anticoagulated with Coumadin INR 2.7 on admit > 3.25 after her regular home dose of 2mg  last pm.  Will not hold today as TEE/DCCV planned tomorrow, no bleeding noted, CBC stable. Per med rec home Coumadin dose 2mg  MWF, 3mg  all other days.    Goal of Therapy:  INR 2-3 Monitor platelets by anticoagulation protocol: Yes   Plan:    Coumadin 1mg  today Daily Protime  Bonnita Nasuti Pharm.D. CPP, BCPS Clinical Pharmacist [phone] 06/05/2013 7:29 AM

## 2013-06-05 NOTE — Care Management Note (Addendum)
  Page 1 of 1   06/13/2013     2:31:51 PM   CARE MANAGEMENT NOTE 06/13/2013  Patient:  [name] [name], [name] [name]   Account Number:  [id number]  Date Initiated:  06/05/2013  Documentation initiated by:  Elissa Hefty  Subjective/Objective Assessment:   adm w ar fib w rvr     Action/Plan:   from nsg facility   Anticipated DC Date:     Anticipated DC Plan:  [place] referral  Clinical Social Worker      DC Planning Services  CM consult      Choice offered to / List presented to:             Status of service:  Completed, signed off Medicare Important Message given?   (If response is "NO", the following Medicare IM given date fields will be blank) Date Medicare IM given:   Date Additional Medicare IM given:    Discharge Disposition:  [place]  Per UR Regulation:  Reviewed for med. necessity/level of care/duration of stay  If discussed at [place] of Stay Meetings, dates discussed:   06/12/2013  06/14/2013    Comments:  06/13/2013 Disposition:  SNF / [facility] Stace Peace RN, BSN, [place], CCM

## 2013-06-05 NOTE — Progress Notes (Addendum)
Advanced Heart Failure Rounding Note Primary Physician: Dr. Quintin Alto  Primary Cardiologist: Dr. Harl Bowie  Reason for Admission: A/C HF  Subjective:    [name] [name] is a 73 yo obese white female with known history of rheumatic fever during childhood and long standing history of chronic diastolic congestive heart failure. She also has a history of CKD, HTN and PAF. The patient has been admitted several times with recurrent exacerbations of CHF. Progressed to severe symptomatic Mitral Regurgitation with Paroxysmal Atrial Fibrillation and underwent minimally invasive maze procedure with MV replacement (05/16/13). Was discharged 05/31/13 to [facility] center and weight was 207 lbs.  Presented to the ED yesterday after going to Dr. Guy Sandifer office for follow up. She had marked volume overload, diarrhea and rapid afib with RVR. Started on IV lasix, amiodarone gtt and vancomycin (cellulitis). Weight down 3 lbs, slight bump in Cr 1.85. K+ 5.1.   Objective:   Weight Range:  Vital Signs:   Temp:  [97.3 F (36.3 C)-101.5 F (38.6 C)] 97.3 F (36.3 C) (03/31 0400) Pulse Rate:  [95-121] 102 (03/31 0700) Resp:  [14-26] 17 (03/31 0700) BP: (82-113)/(32-70) 109/59 mmHg (03/31 0700) SpO2:  [90 %-100 %] 99 % (03/31 0700) Weight:  [203 lb 7.8 oz (92.3 kg)-210 lb (95.255 kg)] 203 lb 7.8 oz (92.3 kg) (03/31 0348) Last BM Date: 06/04/13  Weight change: Filed Weights   06/04/13 1246 06/04/13 1630 06/05/13 0348  Weight: 210 lb (95.255 kg) 206 lb 9.1 oz (93.7 kg) 203 lb 7.8 oz (92.3 kg)    Intake/Output:   Intake/Output Summary (Last 24 hours) at 06/05/13 0707 Last data filed at 06/05/13 0500  Gross per 24 hour  Intake 1078.65 ml  Output   1300 ml  Net -221.35 ml     Physical Exam: General: Chronically ill appearing. No resp difficulty  HEENT: normal  Neck: supple. JVP 12. Carotids 2+ bilat; no bruits. No lymphadenopathy or thryomegaly appreciated.  Cor: PMI nondisplaced. Tachy rate & regular rhythm. No  rubs, gallops or murmurs.  Lungs: clear  Abdomen: soft, nontender, +distended. No hepatosplenomegaly. No bruits or masses. Good bowel sounds.  Extremities: no cyanosis, clubbing, rash, bilateral LE 2-3+ edema to thighs with erythema  Neuro: alert & orientedx3, cranial nerves grossly intact. moves all 4 extremities w/o difficulty. Affect pleasant  Telemetry: ST with PVCs  Labs: Basic Metabolic Panel:  Recent Labs Lab 05/30/13 0341 06/04/13 1248 06/05/13 0340  NA 141 141 137  K 4.7 4.6 5.1  CL 99 100 97  CO2 33* 28 28  GLUCOSE 103* 96 94  BUN 50* 33* 36*  CREATININE 1.48* 1.68* 1.85*  CALCIUM 8.9 9.0 8.8  MG 1.9  --   --     Liver Function Tests: No results found for this basename: AST, ALT, ALKPHOS, BILITOT, PROT, ALBUMIN,  in the last 168 hours No results found for this basename: LIPASE, AMYLASE,  in the last 168 hours No results found for this basename: AMMONIA,  in the last 168 hours  CBC:  Recent Labs Lab 06/04/13 1248  WBC 6.0  HGB 9.5*  HCT 30.1*  MCV 100.7*  PLT 239    Cardiac Enzymes: No results found for this basename: CKTOTAL, CKMB, CKMBINDEX, TROPONINI,  in the last 168 hours  BNP: BNP (last 3 results)  Recent Labs  05/25/13 0402 05/28/13 0528 06/04/13 1248  PROBNP 5322.0* 3515.0* 5763.0*    Imaging: Dg Chest 1 View  06/04/2013   CLINICAL DATA:  Shortness of breath. Status post mitral valve  replacement and Maze procedure on 05/16/2013.  EXAM: CHEST - 1 VIEW  COMPARISON:  DG CHEST 2 VIEW dated 05/26/2013; DG CHEST 2 VIEW dated 05/23/2013; DG CHEST 1V PORT dated 05/22/2013; DG CHEST 2 VIEW dated 03/20/2013; CT ANGIO CHEST AORTA W/CM & WO/CM dated 05/07/2013; DG CHEST 1V PORT dated 05/17/2013  FINDINGS: There is stable cardiomegaly. Mitral valve replacement visible and stable in appearance lungs show some chronic opacity in the right lower lung zone which has essentially been stable following cardiac surgery. No overt edema is identified. There is no  evidence of significant pleural fluid or pneumothorax.  IMPRESSION: Stable cardiomegaly and postoperative right lower lung opacity which is likely a combination of atelectasis and scarring.   Electronically Signed   By: Aletta Edouard M.D.   On: 06/04/2013 11:20      Medications:     Scheduled Medications: . aspirin EC  81 mg Oral Daily  . darifenacin  7.5 mg Oral Daily  . furosemide  80 mg Intravenous 3 times per day  . iron polysaccharides  150 mg Oral Daily  . metoprolol tartrate  12.5 mg Oral BID  . potassium chloride  20 mEq Oral BID  . simvastatin  10 mg Oral q1800  . sodium chloride  3 mL Intravenous Q12H  . spironolactone  25 mg Oral Daily  . vancomycin  1,000 mg Intravenous Q24H  . Warfarin - Pharmacist Dosing Inpatient   Does not apply q1800     Infusions: . amiodarone (NEXTERONE PREMIX) 360 mg/200 mL dextrose 30 mg/hr (06/05/13 0348)     PRN Medications:  sodium chloride, acetaminophen, guaiFENesin, sodium chloride, traMADol   Assessment/Plan/Discussion   1) A/C diastolic HF: - Started on IV lasix 80 mg TID yesterday, weight down 3 lbs (24 hr I/O likely inaccurate). Bump in Cr 1.85. Pending ECHO. She still remains markedly volume overloaded will continue IV lasix today and will add a dose of metolazone. 2) Afib with RVR - Rhythm difficult, ?atypical atrial flutter.   - On coumadin and pharmacy dosing. Supratherapeutic 3.25.  4) CKD  - baseline Cr 1.6. Bump in Cr this am to 1.85. Will continue to follow, but concerned that it is going to be difficult to manage fluid with kidney function. May need a little milrinone to help facilitate diuresis.  5) Cellulitis - LE erythema and warm to touch. On arrival temp 101.5 and now afebrile. Will continue IV vancomycin per pharmacy. 6) Diarrhea - No longer having diarrhea. No stool since admission, pending cdiff.    Length of Stay: 1 Rande Brunt NP-C 06/05/2013, 7:07 AM  Advanced Heart Failure Team Pager  [phone] (M-F; 7a - 4p)  Please contact [facility] for night-coverage after hours (4p -7a ) and weekends on [email or web address]  Patient seen with NP, agree with the above note.  Patient seems to have diuresed well, weight is down 3 lbs (I/Os not accurate as she had a fair amount of UOP in ER).  She feels better this morning.  Still quite volume overloaded on exam.  No diarrhea.  Treating for cellulitis.  - Continue Lasix 80 mg IV every 8 hours => will follow creatinine closely, get pm BMET.  - Echo today => if poor RV function and creatinine continues to rise, could consider use of milrinone.  - Suspect she is in an atypical atrial flutter.  Will arrange for TEE-guided DCCV tomorrow. ECG reviewed with EP.  - Work with PT  [name] 06/05/2013 7:50 AM

## 2013-06-06 ENCOUNTER — Encounter (HOSPITAL_COMMUNITY): Payer: Self-pay

## 2013-06-06 ENCOUNTER — Inpatient Hospital Stay (HOSPITAL_COMMUNITY): Payer: Medicare HMO | Admitting: Anesthesiology

## 2013-06-06 ENCOUNTER — Other Ambulatory Visit: Payer: Self-pay

## 2013-06-06 ENCOUNTER — Encounter (HOSPITAL_COMMUNITY): Payer: Medicare HMO | Admitting: Anesthesiology

## 2013-06-06 ENCOUNTER — Encounter (HOSPITAL_COMMUNITY)
Admission: EM | Disposition: A | Discharge status: Skilled Nursing Facility | Payer: Commercial Managed Care - HMO | Source: Home / Self Care | Attending: Cardiology

## 2013-06-06 DIAGNOSIS — I359 Nonrheumatic aortic valve disorder, unspecified: Secondary | ICD-10-CM

## 2013-06-06 DIAGNOSIS — N189 Chronic kidney disease, unspecified: Secondary | ICD-10-CM | POA: Diagnosis not present

## 2013-06-06 DIAGNOSIS — I4891 Unspecified atrial fibrillation: Secondary | ICD-10-CM | POA: Diagnosis not present

## 2013-06-06 DIAGNOSIS — I5031 Acute diastolic (congestive) heart failure: Secondary | ICD-10-CM | POA: Diagnosis not present

## 2013-06-06 DIAGNOSIS — I4892 Unspecified atrial flutter: Secondary | ICD-10-CM

## 2013-06-06 HISTORY — PX: CARDIOVERSION: SHX1299

## 2013-06-06 HISTORY — PX: TEE WITHOUT CARDIOVERSION: SHX5443

## 2013-06-06 LAB — PROTIME-INR
INR: 3.35 — AB (ref 0.00–1.49)
PROTHROMBIN TIME: 32.7 s — AB (ref 11.6–15.2)

## 2013-06-06 LAB — CBC WITH DIFFERENTIAL/PLATELET
BASOS PCT: 0 % (ref 0–1)
Basophils Absolute: 0 10*3/uL (ref 0.0–0.1)
EOS ABS: 0.2 10*3/uL (ref 0.0–0.7)
Eosinophils Relative: 3 % (ref 0–5)
HEMATOCRIT: 28.2 % — AB (ref 36.0–46.0)
HEMOGLOBIN: 9 g/dL — AB (ref 12.0–15.0)
LYMPHS ABS: 0.7 10*3/uL (ref 0.7–4.0)
Lymphocytes Relative: 12 % (ref 12–46)
MCH: 31.8 pg (ref 26.0–34.0)
MCHC: 31.9 g/dL (ref 30.0–36.0)
MCV: 99.6 fL (ref 78.0–100.0)
MONO ABS: 0.8 10*3/uL (ref 0.1–1.0)
Monocytes Relative: 14 % — ABNORMAL HIGH (ref 3–12)
Neutro Abs: 3.9 10*3/uL (ref 1.7–7.7)
Neutrophils Relative %: 71 % (ref 43–77)
Platelets: 220 10*3/uL (ref 150–400)
RBC: 2.83 MIL/uL — AB (ref 3.87–5.11)
RDW: 17 % — ABNORMAL HIGH (ref 11.5–15.5)
WBC: 5.5 10*3/uL (ref 4.0–10.5)

## 2013-06-06 LAB — BASIC METABOLIC PANEL
BUN: 33 mg/dL — ABNORMAL HIGH (ref 6–23)
CO2: 26 mEq/L (ref 19–32)
Calcium: 8.1 mg/dL — ABNORMAL LOW (ref 8.4–10.5)
Chloride: 97 mEq/L (ref 96–112)
Creatinine, Ser: 1.68 mg/dL — ABNORMAL HIGH (ref 0.50–1.10)
GFR, EST AFRICAN AMERICAN: 34 mL/min — AB (ref >90)
GFR, EST NON AFRICAN AMERICAN: 29 mL/min — AB (ref >90)
Glucose, Bld: 100 mg/dL — ABNORMAL HIGH (ref 70–99)
POTASSIUM: 4.6 meq/L (ref 3.7–5.3)
SODIUM: 138 meq/L (ref 137–147)

## 2013-06-06 SURGERY — ECHOCARDIOGRAM, TRANSESOPHAGEAL
Anesthesia: General

## 2013-06-06 MED ORDER — BUTAMBEN-TETRACAINE-BENZOCAINE 2-2-14 % EX AERO
INHALATION_SPRAY | CUTANEOUS | Status: DC | PRN
  Administered 2013-06-06: 2 via TOPICAL

## 2013-06-06 MED ORDER — PROPOFOL INFUSION 10 MG/ML OPTIME
INTRAVENOUS | Status: DC | PRN
  Administered 2013-06-06: 100 ug/kg/min via INTRAVENOUS

## 2013-06-06 MED ORDER — METOLAZONE 2.5 MG PO TABS
2.5000 mg | ORAL_TABLET | Freq: Once | ORAL | Status: DC
  Filled 2013-06-06: qty 1

## 2013-06-06 MED ORDER — SODIUM CHLORIDE 0.9 % IV SOLN
INTRAVENOUS | Status: DC | PRN
  Administered 2013-06-06: 09:00:00 via INTRAVENOUS

## 2013-06-06 MED ORDER — DARIFENACIN HYDROBROMIDE ER 7.5 MG PO TB24
7.5000 mg | ORAL_TABLET | Freq: Once | ORAL | Status: AC
  Administered 2013-06-06: 7.5 mg via ORAL
  Filled 2013-06-06: qty 1

## 2013-06-06 MED ORDER — POLYSACCHARIDE IRON COMPLEX 150 MG PO CAPS
150.0000 mg | ORAL_CAPSULE | Freq: Once | ORAL | Status: AC
  Administered 2013-06-06: 150 mg via ORAL
  Filled 2013-06-06: qty 1

## 2013-06-06 MED ORDER — METOLAZONE 2.5 MG PO TABS
2.5000 mg | ORAL_TABLET | Freq: Once | ORAL | Status: AC
  Administered 2013-06-06: 2.5 mg via ORAL
  Filled 2013-06-06: qty 1

## 2013-06-06 MED ORDER — SPIRONOLACTONE 25 MG PO TABS
25.0000 mg | ORAL_TABLET | Freq: Once | ORAL | Status: AC
  Administered 2013-06-06: 25 mg via ORAL
  Filled 2013-06-06: qty 1

## 2013-06-06 MED ORDER — AMIODARONE IV BOLUS ONLY 150 MG/100ML
150.0000 mg | Freq: Once | INTRAVENOUS | Status: AC
  Administered 2013-06-06: 150 mg via INTRAVENOUS
  Filled 2013-06-06: qty 100

## 2013-06-06 MED ORDER — FUROSEMIDE 10 MG/ML IJ SOLN
20.0000 mg/h | INTRAMUSCULAR | Status: DC
  Administered 2013-06-06 – 2013-06-07 (×3): 15 mg/h via INTRAVENOUS
  Administered 2013-06-08 – 2013-06-12 (×10): 20 mg/h via INTRAVENOUS
  Filled 2013-06-06 (×16): qty 25

## 2013-06-06 MED ORDER — WARFARIN 0.5 MG HALF TABLET
0.5000 mg | ORAL_TABLET | Freq: Once | ORAL | Status: AC
  Administered 2013-06-06: 0.5 mg via ORAL
  Filled 2013-06-06: qty 1

## 2013-06-06 MED ORDER — LACTATED RINGERS IV SOLN
INTRAVENOUS | Status: DC | PRN
  Administered 2013-06-06: 08:00:00 via INTRAVENOUS

## 2013-06-06 NOTE — Progress Notes (Signed)
  Echocardiogram TEE has been performed.  [name] [name] 06/06/2013, 10:56 AM

## 2013-06-06 NOTE — Interval H&P Note (Signed)
History and Physical Interval Note:  06/06/2013 8:53 AM  [name] [name]  has presented today for surgery, with the diagnosis of HEART FAILURE/AFIB  The various methods of treatment have been discussed with the patient and family. After consideration of risks, benefits and other options for treatment, the patient has consented to  Procedure(s): TRANSESOPHAGEAL ECHOCARDIOGRAM (TEE) (N/A) CARDIOVERSION (N/A) as a surgical intervention .  The patient's history has been reviewed, patient examined, no change in status, stable for surgery.  I have reviewed the patient's chart and labs.  Questions were answered to the patient's satisfaction.     [place] [employer]

## 2013-06-06 NOTE — Transfer of Care (Signed)
Immediate Anesthesia Transfer of Care Note  Patient: [name] [name]  Procedure(s) Performed: Procedure(s): TRANSESOPHAGEAL ECHOCARDIOGRAM (TEE) (N/A) CARDIOVERSION (N/A)  Patient Location: PACU and Endoscopy Unit  Anesthesia Type:General  Level of Consciousness: sedated and patient cooperative  Airway & Oxygen Therapy: Patient Spontanous Breathing and Patient connected to nasal cannula oxygen  Post-op Assessment: Report given to PACU RN and Post -op Vital signs reviewed and stable  Post vital signs: Reviewed and stable  Complications: No apparent anesthesia complications

## 2013-06-06 NOTE — Anesthesia Procedure Notes (Signed)
Procedure Name: MAC Date/Time: 06/06/2013 8:40 AM Performed by: Wanita Chamberlain Pre-anesthesia Checklist: Patient identified, Patient being monitored, Emergency Drugs available, Timeout performed and Suction available Patient Re-evaluated:Patient Re-evaluated prior to inductionOxygen Delivery Method: Ambu bag Preoxygenation: Pre-oxygenation with 100% oxygen Intubation Type: IV induction Ventilation: Mask ventilation without difficulty Dental Injury: Teeth and Oropharynx as per pre-operative assessment

## 2013-06-06 NOTE — Preoperative (Signed)
Beta Blockers   Reason not to administer Beta Blockers:Not Applicable 

## 2013-06-06 NOTE — Anesthesia Preprocedure Evaluation (Addendum)
Anesthesia Evaluation  Patient identified by MRN, date of birth, ID band Patient awake    Reviewed: Allergy & Precautions, H&P , NPO status , Patient's Chart, lab work & pertinent test results, reviewed documented beta blocker date and time   Airway Mallampati: II TM Distance: <3 FB Neck ROM: Full    Dental  (+) Teeth Intact, Dental Advisory Given   Pulmonary shortness of breath, COPD + rhonchi   Pulmonary exam normal       Cardiovascular Exercise Tolerance: Poor hypertension, Pt. on medications and Pt. on home beta blockers +CHF negative cardio ROS  + dysrhythmias Atrial Fibrillation Rhythm:Irregular Rate:Tachycardia   LV EF: 40% -45 History:   PMH:   Congestive heart failure.  Aortic valve disease.  Risk factors:  Hypertension.  2 D ECHO----------------------06-05-13  - Procedure narrative: Transthoracic echocardiography. Poor   endocardial definition. Intravenous contrast (Definity)   was administered. - Left ventricle: The cavity size was normal. Wall thickness   was normal. Systolic function was mildly to moderately   reduced. The estimated ejection fraction was in the range   of 40% to 45%. LV diastolic function cannot be assessed   due to the prosthetic mitral valve. - Aortic valve: Poorly visualized. Mildly calcified   leaflets. Transvalvular velocity was minimally increased.   Mild regurgitation. - Mitral valve: Bioprosthetic mitral valve. Leaflets not   well-visualized. Appears to be stable. Peak and mean   gradients of 14 and 6 mmHg across the valve. Trivial   regurgitation. Valve area by continuity equation (using   LVOT flow): 1.33cm^2. - Right ventricle: The cavity size was mildly dilated.   Systolic function is mildly reduced. - Right atrium: The atrium was normal in size. - Tricuspid valve: Mild regurgitation. - Pulmonary arteries: PA peak pressure: 78mm Hg (S). - Inferior vena cava: The vessel was  dilated; the   respirophasic diameter changes were blunted (< 50%);   findings are consistent with elevated central venous   pressure. - Pericardium, extracardiac: There was no pericardial   effusion.     Neuro/Psych    GI/Hepatic negative GI ROS, Neg liver ROS,   Endo/Other  negative endocrine ROSMorbid obesity  Renal/GU Renal InsufficiencyRenal diseasenegative Renal ROS     Musculoskeletal  (+) Arthritis -, Osteoarthritis,    Abdominal (+) + obese,   Peds  Hematology   Anesthesia Other Findings   Reproductive/Obstetrics                      Anesthesia Physical Anesthesia Plan  ASA: III  Anesthesia Plan: General   Post-op Pain Management:    Induction: Intravenous  Airway Management Planned: Mask  Additional Equipment:   Intra-op Plan:   Post-operative Plan:   Informed Consent: I have reviewed the patients History and Physical, chart, labs and discussed the procedure including the risks, benefits and alternatives for the proposed anesthesia with the patient or authorized representative who has indicated his/her understanding and acceptance.     Plan Discussed with: CRNA and Surgeon  Anesthesia Plan Comments:     Anesthesia Quick Evaluation

## 2013-06-06 NOTE — Progress Notes (Signed)
[place] for Coumadin Indication: atrial fibrillation  Allergies  Allergen Reactions  . Indomethacin Other (See Comments)    dizziness  . Norvasc [Amlodipine Besylate] Cough   Labs:  Recent Labs  06/04/13 1248 06/05/13 0340 06/05/13 0525 06/05/13 1425 06/06/13 0235  HGB 9.5*  --   --   --  9.0*  HCT 30.1*  --   --   --  28.2*  PLT 239  --   --   --  220  LABPROT 28.0*  --  32.0*  --  32.7*  INR 2.73*  --  3.25*  --  3.35*  CREATININE 1.68* 1.85*  --  1.80* 1.68*    Estimated Creatinine Clearance: 32.3 ml/min (by C-G formula based on Cr of 1.68).   Assessment: 72yof admitted from SNF for wight gain, orthopnea, low grade fever, diarrhea.  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   She is anticoagulated with Coumadin INR up this am to 3.3 after low dose given last night of 1mg  last pm.  Did not hold with TEE/DCCV planned today, no bleeding noted, CBC stable.  Per med rec home Coumadin dose 2mg  MWF, 3mg  all other days.    Goal of Therapy:  INR 2-3 Monitor platelets by anticoagulation protocol: Yes   Plan:  Coumadin 0.5mg  today Daily Protime  Erin Hearing PharmD., BCPS Clinical Pharmacist Pager [phone] 06/06/2013 7:07 AM

## 2013-06-06 NOTE — Procedures (Signed)
Electrical Cardioversion Procedure Note [name] [name] [id number] [birth year]  Procedure: Electrical Cardioversion Indications:  Atrial Flutter, atypical.   Procedure Details Consent: Risks of procedure as well as the alternatives and risks of each were explained to the (patient/caregiver).  Consent for procedure obtained. Time Out: Verified patient identification, verified procedure, site/side was marked, verified correct patient position, special equipment/implants available, medications/allergies/relevent history reviewed, required imaging and test results available.  Performed  Patient placed on cardiac monitor, pulse oximetry, supplemental oxygen as necessary.  Sedation given: Propofol per anesthesiology. Pacer pads placed anterior and posterior chest.  Cardioverted 1 time(s).  Cardioverted at 150J.  Evaluation Findings: Post procedure EKG shows: NSR Complications: None Patient did tolerate procedure well.   [name] [name] 06/06/2013, 9:22 AM

## 2013-06-06 NOTE — Progress Notes (Signed)
Clinical Social Work Department BRIEF PSYCHOSOCIAL ASSESSMENT 06/06/2013  Patient:  [name] [name], [name] [name]     Account Number:  [id number]     Admit date:  06/04/2013  Clinical Social Worker:  Freeman Caldron  Date/Time:  06/05/2013 03:55 PM  Referred by:  Physician  Date Referred:  06/05/2013 Referred for  SNF Placement   Other Referral:   Interview type:  Patient Other interview type:    PSYCHOSOCIAL DATA Living Status:  FACILITY Admitted from facility:  [facility] Level of care:  [place] Primary support name:  [name] [phone]) Primary support relationship to patient:  CHILD, ADULT Degree of support available:   Good--pt has ample family support and was at [facility] for rehab prior to hospital admission.    CURRENT CONCERNS Current Concerns  Post-Acute Placement   Other Concerns:    SOCIAL WORK ASSESSMENT / PLAN Spoke with pt, who had cousins and family friends leaving the room as I was entering to complete assessment. Pt spoke with me extensively concerning her experience at [facility]--she explained that the first two nights she was there, they treated her poorly. I asked her to expound on this, and pt states they let pt soil herself and were slow to respond to the RN call button. Pt states she complained about the care she was receiving and she starting having better care. I asked if pt wanted to look for a different facility for rehab when ready for discharge, but pt states "oh no, this one is just fine. I want to go back." [facility] is also conveniently located for family visitation. After developing rapport with patient, she explained she has a large family (1 child, 4 grand, and 7 great-grand kids). I called [employer] rep to determine if pt has a [employer] care plan, and her rep is Cephus Richer [phone]). CSW will contact Lasandra when pt is ready for discharge for insurance auth. Will continue to provide support to pt  while she is in the hospital, as she states she benefited from friendly visit and conversation.   Assessment/plan status:  Psychosocial Support/Ongoing Assessment of Needs Other assessment/ plan:   Information/referral to community resources:   SNF [facility])    PATIENT'S/FAMILY'S RESPONSE TO PLAN OF CARE: Good--pt engaged in conversation with me about SNF and her large family. Engaged pt in brief solution focused therapy, coupled with active listening, reflective listening, and empathic listening techniques. Will continue to follow case and provide assistance when discharge is imminent.       Ky Barban, MSW, [facility] Clinical Social Worker [phone]

## 2013-06-06 NOTE — CV Procedure (Signed)
Procedure: TEE  Indication: Atrial flutter, pre-DCCV  Sedation: Propofol per anesthesiology  Findings: Please see echo section for full report.  Normal LV size with mild LV hypertrophy.  EF 45% with global mild hypokinesis.  Normal RV size with mildly decreased systolic function.  Peak RV-RA gradient 37 mmHg.  There was a bioprosthetic mitral valve with trivial regurgitation and no stenosis.  There was a bicuspid aortic valve with no stenosis but mild-moderate aortic insufficiency.  The LA appendage was ligated with prior surgery but there was some flow into it still.  No thrombus noted in LA/appendage.  Moderate TR.    May proceed with DCCV.  [name] [name] 06/06/2013 9:22 AM

## 2013-06-06 NOTE — Anesthesia Postprocedure Evaluation (Signed)
  Anesthesia Post-op Note  Patient: [name] [name]  Procedure(s) Performed: Procedure(s): TRANSESOPHAGEAL ECHOCARDIOGRAM (TEE) (N/A) CARDIOVERSION (N/A)  Patient Location: PACU and Endoscopy Unit  Anesthesia Type:General  Level of Consciousness: awake  Airway and Oxygen Therapy: Patient Spontanous Breathing  Post-op Pain: none  Post-op Assessment: Post-op Vital signs reviewed, Patient's Cardiovascular Status Stable, Respiratory Function Stable, Patent Airway, No signs of Nausea or vomiting and Pain level controlled  Post-op Vital Signs: Reviewed and stable  Complications: No apparent anesthesia complications

## 2013-06-06 NOTE — H&P (View-Only) (Signed)
Advanced Heart Failure Rounding Note Primary Physician: Dr. Quintin Alto  Primary Cardiologist: Dr. Harl Bowie  Reason for Admission: A/C HF  Subjective:    [name] [name] is a 73 yo obese white female with known history of rheumatic fever during childhood and long standing history of chronic diastolic congestive heart failure. She also has a history of CKD, HTN and PAF. The patient has been admitted several times with recurrent exacerbations of CHF. Progressed to severe symptomatic Mitral Regurgitation with Paroxysmal Atrial Fibrillation and underwent minimally invasive maze procedure with MV replacement (05/16/13). Was discharged 05/31/13 to [facility] center and weight was 207 lbs.  Presented to the ED 3/30 after going to Dr. Guy Sandifer office for follow up. She had marked volume overload, diarrhea and rapid afib with RVR. Started on IV lasix, amiodarone gtt and vancomycin (cellulitis). Weight up 4 lbs and 24 hr I/O +350 cc. Cr 1.68 stable. Denies SOB or CP.  ECHO 3/31: EF 40-45%, RV mildly dilated and sys fx mildly reduced  Objective:   Weight Range:  Vital Signs:   Temp:  [97.4 F (36.3 C)-98.3 F (36.8 C)] 97.4 F (36.3 C) (04/01 0100) Pulse Rate:  [101-106] 105 (04/01 0537) Resp:  [12-33] 18 (04/01 0537) BP: (81-123)/(32-71) 104/57 mmHg (04/01 0537) SpO2:  [95 %-100 %] 97 % (04/01 0537) Weight:  [207 lb 0.2 oz (93.9 kg)] 207 lb 0.2 oz (93.9 kg) (04/01 0500) Last BM Date: 06/04/13  Weight change: Filed Weights   06/04/13 1630 06/05/13 0348 06/06/13 0500  Weight: 206 lb 9.1 oz (93.7 kg) 203 lb 7.8 oz (92.3 kg) 207 lb 0.2 oz (93.9 kg)    Intake/Output:   Intake/Output Summary (Last 24 hours) at 06/06/13 0659 Last data filed at 06/06/13 0500  Gross per 24 hour  Intake 1954.1 ml  Output   1575 ml  Net  379.1 ml     Physical Exam: General: Chronically ill appearing. No resp difficulty  HEENT: normal  Neck: supple. JVP 12. Carotids 2+ bilat; no bruits. No lymphadenopathy or thryomegaly  appreciated.  Cor: PMI nondisplaced. Tachy rate & regular rhythm. No rubs, gallops or murmurs.  Lungs: clear  Abdomen: soft, nontender, +distended. No hepatosplenomegaly. No bruits or masses. Good bowel sounds.  Extremities: no cyanosis, clubbing, rash, bilateral LE 2-3+ edema to thighs with erythema  Neuro: alert & orientedx3, cranial nerves grossly intact. moves all 4 extremities w/o difficulty. Affect pleasant  Telemetry: Aflutter 106   Labs: Basic Metabolic Panel:  Recent Labs Lab 06/04/13 1248 06/05/13 0340 06/05/13 1425 06/06/13 0235  NA 141 137 139 138  K 4.6 5.1 4.6 4.6  CL 100 97 99 97  CO2 28 28 28 26   GLUCOSE 96 94 133* 100*  BUN 33* 36* 38* 33*  CREATININE 1.68* 1.85* 1.80* 1.68*  CALCIUM 9.0 8.8 8.9 8.1*    Liver Function Tests: No results found for this basename: AST, ALT, ALKPHOS, BILITOT, PROT, ALBUMIN,  in the last 168 hours No results found for this basename: LIPASE, AMYLASE,  in the last 168 hours No results found for this basename: AMMONIA,  in the last 168 hours  CBC:  Recent Labs Lab 06/04/13 1248 06/06/13 0235  WBC 6.0 5.5  NEUTROABS  --  3.9  HGB 9.5* 9.0*  HCT 30.1* 28.2*  MCV 100.7* 99.6  PLT 239 220    Cardiac Enzymes: No results found for this basename: CKTOTAL, CKMB, CKMBINDEX, TROPONINI,  in the last 168 hours  BNP: BNP (last 3 results)  Recent Labs  05/25/13  0402 05/28/13 0528 06/04/13 1248  PROBNP 5322.0* 3515.0* 5763.0*    Imaging: Dg Chest 1 View  06/04/2013   CLINICAL DATA:  Shortness of breath. Status post mitral valve replacement and Maze procedure on 05/16/2013.  EXAM: CHEST - 1 VIEW  COMPARISON:  DG CHEST 2 VIEW dated 05/26/2013; DG CHEST 2 VIEW dated 05/23/2013; DG CHEST 1V PORT dated 05/22/2013; DG CHEST 2 VIEW dated 03/20/2013; CT ANGIO CHEST AORTA W/CM & WO/CM dated 05/07/2013; DG CHEST 1V PORT dated 05/17/2013  FINDINGS: There is stable cardiomegaly. Mitral valve replacement visible and stable in appearance lungs show  some chronic opacity in the right lower lung zone which has essentially been stable following cardiac surgery. No overt edema is identified. There is no evidence of significant pleural fluid or pneumothorax.  IMPRESSION: Stable cardiomegaly and postoperative right lower lung opacity which is likely a combination of atelectasis and scarring.   Electronically Signed   By: Glenn  Yamagata M.D.   On: 06/04/2013 11:20   Dg Chest 2 View  06/05/2013   CLINICAL DATA:  Short of breath.  Weakness.  EXAM: CHEST  2 VIEW  COMPARISON:  DG CHEST 1 VIEW dated 06/04/2013; DG CHEST 2 VIEW dated 05/26/2013; DG CHEST 2 VIEW dated 05/23/2013; DG CHEST 2 VIEW dated 03/20/2013  FINDINGS: Cardiomegaly is present, stable. Bioprosthetic mitral valve replacement. Opacity is present at the right lung base, slightly improved compared to the recent prior examination yesterday. This may represent improving asymmetric pulmonary edema with superimposed airspace disease/pneumonia. Small pleural effusion is present with blunting of the right costophrenic angle. Monitoring leads project over the chest.  IMPRESSION: Mild improvement in airspace disease at the right lung base. Small right pleural effusion.   Electronically Signed   By: Geoffrey  Lamke M.D.   On: 06/05/2013 08:26     Medications:     Scheduled Medications: . darifenacin  7.5 mg Oral Daily  . furosemide  80 mg Intravenous 3 times per day  . iron polysaccharides  150 mg Oral Daily  . metoprolol tartrate  12.5 mg Oral BID  . simvastatin  10 mg Oral q1800  . sodium chloride  3 mL Intravenous Q12H  . sodium chloride  3 mL Intravenous Q12H  . spironolactone  25 mg Oral Daily  . vancomycin  1,000 mg Intravenous Q24H  . Warfarin - Pharmacist Dosing Inpatient   Does not apply q1800    Infusions: . sodium chloride    . amiodarone (NEXTERONE PREMIX) 360 mg/200 mL dextrose 30 mg/hr (06/06/13 0237)    PRN Medications: sodium chloride, acetaminophen, guaiFENesin, sodium  chloride, sodium chloride, traMADol   Assessment/Plan/Discussion   1) A/C primarily diastolic CHF: - Remains on IV lasix 80 mg TID with minimal UOP. Remains markedly volume overloaded. Cr stable. Will start lasix gtt today at 15 mg/hr with a 2.5 mg metolazone. ECHO reviewed and EF slightly down 40-45% and RV mildly dilated and sys fx mildly reduced.  SBP 90-100s.  2) Atypical atrial flutter with RVR - Rhythm difficult, ?atypical atrial flutter. Going for TEE/DC-CV today. If successful will try to switch to PO amiodarone after procedure.  - On coumadin and pharmacy dosing.  4) CKD  - back to baseline 1.68. As above will increase diuretics and continue to follow. May need a little milrinone to help facilitate diuresis.  5) Cellulitis - LE erythema and warm to touch. On arrival temp 101.5 and now afebrile. Will continue IV vancomycin per pharmacy. 6) Diarrhea - No longer having diarrhea. No   stool since admission.  Continue to work PT Length of Stay: 2 [place], Ali B NP-C 06/06/2013, 6:59 AM  Advanced Heart Failure Team Pager [phone] (M-F; 7a - 4p)  Please contact [facility] for night-coverage after hours (4p -7a ) and weekends on [email or web address]  Patient seen with NP, agree with the above note.  She remains on amiodarone gtt and in atypical atrial flutter.  Creatinine stable but not diuresing.  Will start Lasix gtt with metolazone as above.  Going for TEE-guided DCCV today.   Jamika Sadek 06/06/2013 7:30 AM     

## 2013-06-06 NOTE — Progress Notes (Signed)
TCTS BRIEF PROGRESS NOTE   Doing well following DCCV and maintaining NSR so far Will continue to follow  [name] [name] H 06/06/2013 3:49 PM

## 2013-06-06 NOTE — Progress Notes (Signed)
Advanced Heart Failure Rounding Note Primary Physician: Dr. Quintin Alto  Primary Cardiologist: Dr. Harl Bowie  Reason for Admission: A/C HF  Subjective:    [name] [name] is a 73 yo obese white female with known history of rheumatic fever during childhood and long standing history of chronic diastolic congestive heart failure. She also has a history of CKD, HTN and PAF. The patient has been admitted several times with recurrent exacerbations of CHF. Progressed to severe symptomatic Mitral Regurgitation with Paroxysmal Atrial Fibrillation and underwent minimally invasive maze procedure with MV replacement (05/16/13). Was discharged 05/31/13 to [facility] center and weight was 207 lbs.  Presented to the ED 3/30 after going to Dr. Guy Sandifer office for follow up. She had marked volume overload, diarrhea and rapid afib with RVR. Started on IV lasix, amiodarone gtt and vancomycin (cellulitis). Weight up 4 lbs and 24 hr I/O +350 cc. Cr 1.68 stable. Denies SOB or CP.  ECHO 3/31: EF 40-45%, RV mildly dilated and sys fx mildly reduced  Objective:   Weight Range:  Vital Signs:   Temp:  [97.4 F (36.3 C)-98.3 F (36.8 C)] 97.4 F (36.3 C) (04/01 0100) Pulse Rate:  [101-106] 105 (04/01 0537) Resp:  [12-33] 18 (04/01 0537) BP: (81-123)/(32-71) 104/57 mmHg (04/01 0537) SpO2:  [95 %-100 %] 97 % (04/01 0537) Weight:  [207 lb 0.2 oz (93.9 kg)] 207 lb 0.2 oz (93.9 kg) (04/01 0500) Last BM Date: 06/04/13  Weight change: Filed Weights   06/04/13 1630 06/05/13 0348 06/06/13 0500  Weight: 206 lb 9.1 oz (93.7 kg) 203 lb 7.8 oz (92.3 kg) 207 lb 0.2 oz (93.9 kg)    Intake/Output:   Intake/Output Summary (Last 24 hours) at 06/06/13 0659 Last data filed at 06/06/13 0500  Gross per 24 hour  Intake 1954.1 ml  Output   1575 ml  Net  379.1 ml     Physical Exam: General: Chronically ill appearing. No resp difficulty  HEENT: normal  Neck: supple. JVP 12. Carotids 2+ bilat; no bruits. No lymphadenopathy or thryomegaly  appreciated.  Cor: PMI nondisplaced. Tachy rate & regular rhythm. No rubs, gallops or murmurs.  Lungs: clear  Abdomen: soft, nontender, +distended. No hepatosplenomegaly. No bruits or masses. Good bowel sounds.  Extremities: no cyanosis, clubbing, rash, bilateral LE 2-3+ edema to thighs with erythema  Neuro: alert & orientedx3, cranial nerves grossly intact. moves all 4 extremities w/o difficulty. Affect pleasant  Telemetry: Aflutter 106   Labs: Basic Metabolic Panel:  Recent Labs Lab 06/04/13 1248 06/05/13 0340 06/05/13 1425 06/06/13 0235  NA 141 137 139 138  K 4.6 5.1 4.6 4.6  CL 100 97 99 97  CO2 28 28 28 26   GLUCOSE 96 94 133* 100*  BUN 33* 36* 38* 33*  CREATININE 1.68* 1.85* 1.80* 1.68*  CALCIUM 9.0 8.8 8.9 8.1*    Liver Function Tests: No results found for this basename: AST, ALT, ALKPHOS, BILITOT, PROT, ALBUMIN,  in the last 168 hours No results found for this basename: LIPASE, AMYLASE,  in the last 168 hours No results found for this basename: AMMONIA,  in the last 168 hours  CBC:  Recent Labs Lab 06/04/13 1248 06/06/13 0235  WBC 6.0 5.5  NEUTROABS  --  3.9  HGB 9.5* 9.0*  HCT 30.1* 28.2*  MCV 100.7* 99.6  PLT 239 220    Cardiac Enzymes: No results found for this basename: CKTOTAL, CKMB, CKMBINDEX, TROPONINI,  in the last 168 hours  BNP: BNP (last 3 results)  Recent Labs  05/25/13  0402 05/28/13 0528 06/04/13 1248  PROBNP 5322.0* 3515.0* 5763.0*    Imaging: Dg Chest 1 View  06/04/2013   CLINICAL DATA:  Shortness of breath. Status post mitral valve replacement and Maze procedure on 05/16/2013.  EXAM: CHEST - 1 VIEW  COMPARISON:  DG CHEST 2 VIEW dated 05/26/2013; DG CHEST 2 VIEW dated 05/23/2013; DG CHEST 1V PORT dated 05/22/2013; DG CHEST 2 VIEW dated 03/20/2013; CT ANGIO CHEST AORTA W/CM & WO/CM dated 05/07/2013; DG CHEST 1V PORT dated 05/17/2013  FINDINGS: There is stable cardiomegaly. Mitral valve replacement visible and stable in appearance lungs show  some chronic opacity in the right lower lung zone which has essentially been stable following cardiac surgery. No overt edema is identified. There is no evidence of significant pleural fluid or pneumothorax.  IMPRESSION: Stable cardiomegaly and postoperative right lower lung opacity which is likely a combination of atelectasis and scarring.   Electronically Signed   By: Aletta Edouard M.D.   On: 06/04/2013 11:20   Dg Chest 2 View  06/05/2013   CLINICAL DATA:  Short of breath.  Weakness.  EXAM: CHEST  2 VIEW  COMPARISON:  DG CHEST 1 VIEW dated 06/04/2013; DG CHEST 2 VIEW dated 05/26/2013; DG CHEST 2 VIEW dated 05/23/2013; DG CHEST 2 VIEW dated 03/20/2013  FINDINGS: Cardiomegaly is present, stable. Bioprosthetic mitral valve replacement. Opacity is present at the right lung base, slightly improved compared to the recent prior examination yesterday. This may represent improving asymmetric pulmonary edema with superimposed airspace disease/pneumonia. Small pleural effusion is present with blunting of the right costophrenic angle. Monitoring leads project over the chest.  IMPRESSION: Mild improvement in airspace disease at the right lung base. Small right pleural effusion.   Electronically Signed   By: Dereck Ligas M.D.   On: 06/05/2013 08:26     Medications:     Scheduled Medications: . darifenacin  7.5 mg Oral Daily  . furosemide  80 mg Intravenous 3 times per day  . iron polysaccharides  150 mg Oral Daily  . metoprolol tartrate  12.5 mg Oral BID  . simvastatin  10 mg Oral q1800  . sodium chloride  3 mL Intravenous Q12H  . sodium chloride  3 mL Intravenous Q12H  . spironolactone  25 mg Oral Daily  . vancomycin  1,000 mg Intravenous Q24H  . Warfarin - Pharmacist Dosing Inpatient   Does not apply q1800    Infusions: . sodium chloride    . amiodarone (NEXTERONE PREMIX) 360 mg/200 mL dextrose 30 mg/hr (06/06/13 0237)    PRN Medications: sodium chloride, acetaminophen, guaiFENesin, sodium  chloride, sodium chloride, traMADol   Assessment/Plan/Discussion   1) A/C primarily diastolic CHF: - Remains on IV lasix 80 mg TID with minimal UOP. Remains markedly volume overloaded. Cr stable. Will start lasix gtt today at 15 mg/hr with a 2.5 mg metolazone. ECHO reviewed and EF slightly down 40-45% and RV mildly dilated and sys fx mildly reduced.  SBP 90-100s.  2) Atypical atrial flutter with RVR - Rhythm difficult, ?atypical atrial flutter. Going for TEE/DC-CV today. If successful will try to switch to PO amiodarone after procedure.  - On coumadin and pharmacy dosing.  4) CKD  - back to baseline 1.68. As above will increase diuretics and continue to follow. May need a little milrinone to help facilitate diuresis.  5) Cellulitis - LE erythema and warm to touch. On arrival temp 101.5 and now afebrile. Will continue IV vancomycin per pharmacy. 6) Diarrhea - No longer having diarrhea. No  stool since admission.  Continue to work PT Length of Stay: 2 Rande Brunt NP-C 06/06/2013, 6:59 AM  Advanced Heart Failure Team Pager [phone] (M-F; 7a - 4p)  Please contact [facility] for night-coverage after hours (4p -7a ) and weekends on [email or web address]  Patient seen with NP, agree with the above note.  She remains on amiodarone gtt and in atypical atrial flutter.  Creatinine stable but not diuresing.  Will start Lasix gtt with metolazone as above.  Going for TEE-guided DCCV today.   [name] [name] 06/06/2013 7:30 AM

## 2013-06-07 ENCOUNTER — Encounter (HOSPITAL_COMMUNITY): Payer: Self-pay | Admitting: Cardiology

## 2013-06-07 DIAGNOSIS — Z954 Presence of other heart-valve replacement: Secondary | ICD-10-CM

## 2013-06-07 LAB — BASIC METABOLIC PANEL
BUN: 35 mg/dL — ABNORMAL HIGH (ref 6–23)
BUN: 34 mg/dL — AB (ref 6–23)
CO2: 27 mEq/L (ref 19–32)
CO2: 31 mEq/L (ref 19–32)
CREATININE: 1.73 mg/dL — AB (ref 0.50–1.10)
Calcium: 8.7 mg/dL (ref 8.4–10.5)
Calcium: 8.6 mg/dL (ref 8.4–10.5)
Chloride: 94 mEq/L — ABNORMAL LOW (ref 96–112)
Chloride: 91 mEq/L — ABNORMAL LOW (ref 96–112)
Creatinine, Ser: 1.72 mg/dL — ABNORMAL HIGH (ref 0.50–1.10)
GFR calc Af Amer: 33 mL/min — ABNORMAL LOW (ref >90)
GFR, EST AFRICAN AMERICAN: 33 mL/min — AB (ref >90)
GFR, EST NON AFRICAN AMERICAN: 28 mL/min — AB (ref >90)
GFR, EST NON AFRICAN AMERICAN: 29 mL/min — AB (ref >90)
GLUCOSE: 173 mg/dL — AB (ref 70–99)
Glucose, Bld: 96 mg/dL (ref 70–99)
POTASSIUM: 4 meq/L (ref 3.7–5.3)
POTASSIUM: 3.4 meq/L — AB (ref 3.7–5.3)
Sodium: 136 mEq/L — ABNORMAL LOW (ref 137–147)
Sodium: 136 mEq/L — ABNORMAL LOW (ref 137–147)

## 2013-06-07 LAB — PROTIME-INR
INR: 3.07 — ABNORMAL HIGH (ref 0.00–1.49)
Prothrombin Time: 30.6 seconds — ABNORMAL HIGH (ref 11.6–15.2)

## 2013-06-07 MED ORDER — MILRINONE IN DEXTROSE 20 MG/100ML IV SOLN
0.2500 ug/kg/min | INTRAVENOUS | Status: DC
  Administered 2013-06-07 – 2013-06-11 (×9): 0.25 ug/kg/min via INTRAVENOUS
  Filled 2013-06-07 (×9): qty 100

## 2013-06-07 MED ORDER — BENZONATATE 100 MG PO CAPS
100.0000 mg | ORAL_CAPSULE | Freq: Two times a day (BID) | ORAL | Status: DC | PRN
  Administered 2013-06-07 (×2): 100 mg via ORAL
  Filled 2013-06-07 (×3): qty 1

## 2013-06-07 MED ORDER — METOLAZONE 2.5 MG PO TABS
2.5000 mg | ORAL_TABLET | Freq: Once | ORAL | Status: AC
  Administered 2013-06-07: 2.5 mg via ORAL
  Filled 2013-06-07: qty 1

## 2013-06-07 MED ORDER — POTASSIUM CHLORIDE CRYS ER 20 MEQ PO TBCR
20.0000 meq | EXTENDED_RELEASE_TABLET | Freq: Once | ORAL | Status: AC
  Administered 2013-06-07: 20 meq via ORAL
  Filled 2013-06-07: qty 1

## 2013-06-07 MED ORDER — WARFARIN SODIUM 1 MG PO TABS
1.0000 mg | ORAL_TABLET | Freq: Once | ORAL | Status: AC
  Administered 2013-06-07: 1 mg via ORAL
  Filled 2013-06-07: qty 1

## 2013-06-07 MED ORDER — AMIODARONE HCL 200 MG PO TABS
400.0000 mg | ORAL_TABLET | Freq: Two times a day (BID) | ORAL | Status: DC
  Filled 2013-06-07 (×2): qty 2

## 2013-06-07 MED ORDER — AMIODARONE HCL IN DEXTROSE 360-4.14 MG/200ML-% IV SOLN
30.0000 mg/h | INTRAVENOUS | Status: DC
  Administered 2013-06-07 – 2013-06-12 (×11): 30 mg/h via INTRAVENOUS
  Filled 2013-06-07 (×24): qty 200

## 2013-06-07 NOTE — Progress Notes (Signed)
Physical Therapy Treatment Patient Details Name: [name] [name] MRN: [id number] DOB: August 08, 1940 Today's Date: 06/07/2013    History of Present Illness Patient is a 73 year old obese widowed white female from [place] with remote history of rheumatic fever during childhood and long-standing history of chronic diastolic congestive heart failure who has recently experienced multiple recurrent episodes of acute exacerbation of chronic diastolic congestive heart failure.  She has been diagnosed with severe, symptomatic mitral regurgitation with recurrent paroxysmal atrial fibrillation S/P minimally invasive MVR. Pt DC to [facility] and returned to hospital  3/30 with bil LE edema and CHF    PT Comments    Pt with improved gait today but decreased ability with transfers and requires mod cues for all transfers. Pt with very slow gait and highly encouraged to continue leg elevation and ankle pumps to assist with edema. Pt able to maintain 90-95% on 1L throughout with HR 69. Will continue to follow.  Follow Up Recommendations  SNF     Equipment Recommendations       Recommendations for Other Services       Precautions / Restrictions Precautions Precautions: Fall Restrictions Weight Bearing Restrictions: No    Mobility  Bed Mobility               General bed mobility comments: pt in recliner on arrival  Transfers Overall transfer level: Needs assistance   Transfers: Sit to/from Stand Sit to Stand: Mod assist         General transfer comment: mod assist for anterior translation from low chair with cues for hand placement and to fully back to chair on return. Min assist for reciprocal scooting with cues on return to chair  Ambulation/Gait Ambulation/Gait assistance: Min guard Ambulation Distance (Feet): 120 Feet Assistive device: Rolling walker (2 wheeled) Gait Pattern/deviations: Step-through pattern;Trunk flexed;Decreased stride length Gait velocity: grossly 67min to  walk 180ft Gait velocity interpretation: <1.8 ft/sec, indicative of risk for recurrent falls     Stairs            Wheelchair Mobility    Modified Rankin (Stroke Patients Only)       Balance                                    Cognition Arousal/Alertness: Awake/alert Behavior During Therapy: WFL for tasks assessed/performed Overall Cognitive Status: Within Functional Limits for tasks assessed                      Exercises General Exercises - Lower Extremity Ankle Circles/Pumps: AROM;Both;Seated;Strengthening;10 reps Long Arc Quad: AROM;Seated;Both;10 reps;Strengthening Hip Flexion/Marching: AROM;Seated;Both;10 reps;Strengthening    General Comments        Pertinent Vitals/Pain No pain    Home Living                      Prior Function            PT Goals (current goals can now be found in the care plan section) Progress towards PT goals: Progressing toward goals    Frequency       PT Plan Current plan remains appropriate    Co-evaluation             End of Session Equipment Utilized During Treatment: Gait belt;Oxygen Activity Tolerance: Patient tolerated treatment well Patient left: in chair;with call bell/phone within reach     Time: 0731-0801 PT Time Calculation (  min): 30 min  Charges:  $Gait Training: 8-22 mins $Therapeutic Exercise: 8-22 mins                    G Codes:      Melford Aase 2013-07-05, 9:27 AM Elwyn Reach, [place]

## 2013-06-07 NOTE — Progress Notes (Signed)
Patient asked referral be made to [facility], stating she does not want to return to [facility]. Referral has been made.   Ky Barban, MSW, [facility] Clinical Social Worker [phone]

## 2013-06-07 NOTE — Progress Notes (Addendum)
TCTS BRIEF PROGRESS NOTE   Progress noted.  Care by advanced heart failure team appreciated. I will be out all next week, my partners are available to cover as needed.  OWEN,CLARENCE H 06/07/2013 2:47 PM

## 2013-06-07 NOTE — Progress Notes (Signed)
Responded  to Education officer, museum to assist patient with revising her advance Directive.  Patient said she has an advance Directive that was done several years ago and she wants to make changes indicating she doesn't want any life support measures done in the event she is dying. I left another AD form for her to review and prepare.  Patient indicated that her daughter would visit later and they would prepare and inform us when they are ready.  Provided listening and guidance. Will follow as needed.  06/07/13 1100  Clinical Encounter Type  Visited With Patient;Health care provider  Visit Type Other (Comment) (Assist with Advance Directive)  Referral From Social work  Spiritual Encounters  Spiritual Needs Literature;Emotional  Stress Factors  Patient Stress Factors None identified  Advance Directives (For Healthcare)  Advance Directive Patient has advance directive, copy not in chart  Cristopher Peru, pager [phone]

## 2013-06-07 NOTE — Progress Notes (Signed)
Advanced Heart Failure Rounding Note Primary Physician: Dr. Quintin Alto  Primary Cardiologist: Dr. Harl Bowie  Reason for Admission: A/C HF  Subjective:    [name] [name] is a 73 yo obese white female with known history of rheumatic fever during childhood and long standing history of chronic diastolic congestive heart failure. She also has a history of CKD, HTN and PAF. The patient has been admitted several times with recurrent exacerbations of CHF. Progressed to severe symptomatic Mitral Regurgitation with Paroxysmal Atrial Fibrillation and underwent minimally invasive maze procedure with MV replacement (05/16/13). Was discharged 05/31/13 to [facility] center and weight was 207 lbs.  Presented to the ED 3/30 after going to Dr. Guy Sandifer office for follow up. She had marked volume overload, diarrhea and rapid afib with RVR. Started on IV lasix, amiodarone gtt and vancomycin (cellulitis). Yesterday TEE/DC-CV which was successful and remains in NSR. Lasix gtt started 15 mg/hr along with 2.5 mg metolazone. Weight down 1 lb, 24 hr I/O -200 cc. Denies SOB or CP. Slight increase in Cr 1.73  ECHO 3/31: EF 40-45%, RV mildly dilated and sys fx mildly reduced  Objective:   Weight Range:  Vital Signs:   Temp:  [97.1 F (36.2 C)-98.4 F (36.9 C)] 98.4 F (36.9 C) (04/02 0342) Pulse Rate:  [59-106] 63 (04/02 0000) Resp:  [16-27] 21 (04/02 0300) BP: (102-128)/(37-58) 117/41 mmHg (04/02 0000) SpO2:  [98 %-100 %] 99 % (04/02 0000) Weight:  [206 lb 9.1 oz (93.7 kg)] 206 lb 9.1 oz (93.7 kg) (04/02 0351) Last BM Date: 06/04/13  Weight change: Filed Weights   06/06/13 0500 06/07/13 0342 06/07/13 0351  Weight: 207 lb 0.2 oz (93.9 kg) 206 lb 9.1 oz (93.7 kg) 206 lb 9.1 oz (93.7 kg)    Intake/Output:   Intake/Output Summary (Last 24 hours) at 06/07/13 1610 Last data filed at 06/07/13 0353  Gross per 24 hour  Intake 1289.35 ml  Output   1500 ml  Net -210.65 ml     Physical Exam: General: Chronically ill appearing.  No resp difficulty  HEENT: normal  Neck: supple. JVP 12. Carotids 2+ bilat; no bruits. No lymphadenopathy or thryomegaly appreciated.  Cor: PMI nondisplaced. Tachy rate & regular rhythm. No rubs, gallops or murmurs.  Lungs: clear  Abdomen: soft, nontender, +distended. No hepatosplenomegaly. No bruits or masses. Good bowel sounds.  Extremities: no cyanosis, clubbing, rash, bilateral LE 2-3+ edema to thighs with erythema  Neuro: alert & orientedx3, cranial nerves grossly intact. moves all 4 extremities w/o difficulty. Affect pleasant  Telemetry: Aflutter 106   Labs: Basic Metabolic Panel:  Recent Labs Lab 06/04/13 1248 06/05/13 0340 06/05/13 1425 06/06/13 0235 06/07/13 0253  NA 141 137 139 138 136*  K 4.6 5.1 4.6 4.6 4.0  CL 100 97 99 97 94*  CO2 28 28 28 26 27   GLUCOSE 96 94 133* 100* 96  BUN 33* 36* 38* 33* 35*  CREATININE 1.68* 1.85* 1.80* 1.68* 1.73*  CALCIUM 9.0 8.8 8.9 8.1* 8.7    Liver Function Tests: No results found for this basename: AST, ALT, ALKPHOS, BILITOT, PROT, ALBUMIN,  in the last 168 hours No results found for this basename: LIPASE, AMYLASE,  in the last 168 hours No results found for this basename: AMMONIA,  in the last 168 hours  CBC:  Recent Labs Lab 06/04/13 1248 06/06/13 0235  WBC 6.0 5.5  NEUTROABS  --  3.9  HGB 9.5* 9.0*  HCT 30.1* 28.2*  MCV 100.7* 99.6  PLT 239 220  Cardiac Enzymes: No results found for this basename: CKTOTAL, CKMB, CKMBINDEX, TROPONINI,  in the last 168 hours  BNP: BNP (last 3 results)  Recent Labs  05/25/13 0402 05/28/13 0528 06/04/13 1248  PROBNP 5322.0* 3515.0* 5763.0*    Imaging: Dg Chest 2 View  06/05/2013   CLINICAL DATA:  Short of breath.  Weakness.  EXAM: CHEST  2 VIEW  COMPARISON:  DG CHEST 1 VIEW dated 06/04/2013; DG CHEST 2 VIEW dated 05/26/2013; DG CHEST 2 VIEW dated 05/23/2013; DG CHEST 2 VIEW dated 03/20/2013  FINDINGS: Cardiomegaly is present, stable. Bioprosthetic mitral valve replacement.  Opacity is present at the right lung base, slightly improved compared to the recent prior examination yesterday. This may represent improving asymmetric pulmonary edema with superimposed airspace disease/pneumonia. Small pleural effusion is present with blunting of the right costophrenic angle. Monitoring leads project over the chest.  IMPRESSION: Mild improvement in airspace disease at the right lung base. Small right pleural effusion.   Electronically Signed   By: Dereck Ligas M.D.   On: 06/05/2013 08:26     Medications:     Scheduled Medications: . darifenacin  7.5 mg Oral Daily  . iron polysaccharides  150 mg Oral Daily  . metolazone  2.5 mg Oral Once  . simvastatin  10 mg Oral q1800  . sodium chloride  3 mL Intravenous Q12H  . sodium chloride  3 mL Intravenous Q12H  . spironolactone  25 mg Oral Daily  . vancomycin  1,000 mg Intravenous Q24H  . Warfarin - Pharmacist Dosing Inpatient   Does not apply q1800    Infusions: . sodium chloride    . amiodarone (NEXTERONE PREMIX) 360 mg/200 mL dextrose 30 mg/hr (06/07/13 0038)  . furosemide (LASIX) infusion 15 mg/hr (06/07/13 0358)    PRN Medications: sodium chloride, acetaminophen, guaiFENesin, sodium chloride, sodium chloride, traMADol   Assessment/Plan/Discussion   1) A/C primarily diastolic CHF: - Switched to lasix gtt 15 mg/hr yesterday along with 2.5 mg metolazone and UOP sluggish. Weight down 1 lb and remains volume overloaded. Slight increase in Cr. ECHO reviewed and EF slightly down 40-45% and RV mildly dilated and sys fx mildly reduced. Will try to start milrinone to facilitate with diuresis. RV is not markedly down, however not having much success at removing fluid. SBP 115-120s. Not currently on a BB. No ACE-I with CRI. Continue spiro 25 mg daily.  2) Atypical atrial flutter with RVR - Rhythm difficult, ?atypical atrial flutter. Went for TEE/DC-CV today yesterday which was successful. She is in NSR this am. Will switch to  PO amiodarone today 400 mg BID and stop gtt. On coumadin and pharmacy dosing.  4) CKD  - baseline 1.68. Slight increase in Cr and she still appears to have volume on board. Will continue lasix gtt and as above will start milrinone.  5) Cellulitis - LE erythema and warm to touch. On arrival temp 101.5 and now afebrile. Will continue IV vancomycin per pharmacy. 6) Diarrhea - No longer having diarrhea. No stool since admission.  Continue to work PT. Will go to [facility] at discharge.  Length of Stay: 3 Rande Brunt NP-C 06/07/2013, 7:02 AM  Advanced Heart Failure Team Pager [phone] (M-F; 7a - 4p)  Please contact [facility] for night-coverage after hours (4p -7a ) and weekends on [email or web address]  Patient seen with NP, agree with the above note.  Diuresis remains poor with stable renal function.  She is staying in NSR after TEE-guided DCCV.  On echo, she has  EF 40-45% with some RV dysfunction.  It may be that augmentation of cardiac output will help diuresis.  Will keep her on amiodarone gtt rather than switching to po and will start a low dose of milrinone, 0.25 mcg/kg/min to see if it will facilitate diuresis.  I will give her another dose of metolazone this morning.   [name] 06/07/2013 7:35 AM

## 2013-06-07 NOTE — Progress Notes (Addendum)
Clinical Social Work Department CLINICAL SOCIAL WORK PLACEMENT NOTE 06/07/2013  Patient:  [name] [name], [name] [name]  Account Number:  [id number] Admit date:  06/04/2013  Clinical Social Worker:  Ky Barban, Latanya Presser  Date/time:  06/07/2013 01:06 PM  Clinical Social Work is seeking post-discharge placement for this patient at the following level of care:   [place]   (*CSW will update this form in Epic as items are completed)   N/A-pt requested clinicals sent to [place] Addendum: NA--pt requested clinical sent to [facility]  Patient/family provided with [place] Department of Clinical Social Work's list of facilities offering this level of care within the geographic area requested by the patient (or if unable, by the patient's family).  06/07/2013  Patient/family informed of their freedom to choose among providers that offer the needed level of care, that participate in Medicare, Medicaid or managed care program needed by the patient, have an available bed and are willing to accept the patient.  06/08/2013  Patient/family informed of [facility]' ownership interest in [facility], as well as of the fact that they are under no obligation to receive care at this facility.  PASARR submitted to EDS on EXISTING PASARR number received from EDS on EXISTING  FL2 transmitted to all facilities in geographic area requested by pt/family on  06/07/2013 FL2 transmitted to all facilities within larger geographic area on   Patient informed that his/her managed care company has contracts with or will negotiate with  certain facilities, including the following:     Patient/family informed of bed offers received:  06/08/2013 Patient chooses bed at  Physician recommends and patient chooses bed at    Patient to be transferred to on   Patient to be transferred to facility by   The following physician request were entered in Epic:   Additional Comments: Pt requested referral be  made to [facility]. Made referral, and [facility] called requesting phone number for pt's room and family members' phone numbers to speak with them about transfer from [facility] to [place]. Facility running insurance now and will respond to bed request if transfer is possible.  Addendum: Informed pt of bed offer from [facility]. Facility has not yet heard from [place] with insurance [place], and [facility] office is closed today for holiday. Facility states they will follow up Monday morning, as I informed them pt believes she will be discharged Monday.   Addendum: Received call from pt's [employer] case manager stating [facility] is not in network. Pt selected [facility] SNF. Referral made to [facility] on 06/08/13.  Ky Barban, MSW, [facility] Clinical Social Worker [phone]

## 2013-06-07 NOTE — Progress Notes (Signed)
Pharmacy Consult Note  Pharmacy Consult for Coumadin Indication: atrial fibrillation  Pharmacy Consult for Vancomycin Indication: Cellulitis  Allergies  Allergen Reactions  . Indomethacin Other (See Comments)    dizziness  . Norvasc [Amlodipine Besylate] Cough   Labs:  Recent Labs  06/04/13 1248  06/05/13 0525 06/05/13 1425 06/06/13 0235 06/07/13 0253  HGB 9.5*  --   --   --  9.0*  --   HCT 30.1*  --   --   --  28.2*  --   PLT 239  --   --   --  220  --   LABPROT 28.0*  --  32.0*  --  32.7* 30.6*  INR 2.73*  --  3.25*  --  3.35* 3.07*  CREATININE 1.68*  < >  --  1.80* 1.68* 1.73*  < > = values in this interval not displayed.  Estimated Creatinine Clearance: 31.3 ml/min (by C-G formula based on Cr of 1.73).   Assessment: 72yof admitted from SNF for wight gain, orthopnea, low grade fever, diarrhea.  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   1.AFib: She is anticoagulated with Coumadin INR now trending down after low dosing the past few days.  Successful cardioversion 4/1. Patient does continue on IV amiodarone. Will continue low dosing for now, no bleeding issues noted. CBC appears stable.  2. Cellulitis: no fevers, wbc normal, cellulitis appears to be improving. Renal function impaired but fairly stable. Patient currently on day #3 of vancomycin. No culture data. Will plan on checking trough over weekend if continues.  Per med rec home Coumadin dose 2mg  MWF, 3mg  all other days.    Goal of Therapy:  INR 2-3 Monitor platelets by anticoagulation protocol: Yes Vancomycin trough 10-15   Plan:  Coumadin 1mg  today Daily Protime Continue vancomycin 1g IV q24 hours - check trough over weekend  Erin Hearing PharmD., [facility] Clinical Pharmacist Pager [phone] 06/07/2013 10:42 AM

## 2013-06-08 ENCOUNTER — Ambulatory Visit: Payer: Medicare HMO | Admitting: Cardiology

## 2013-06-08 DIAGNOSIS — I4892 Unspecified atrial flutter: Secondary | ICD-10-CM

## 2013-06-08 DIAGNOSIS — Z952 Presence of prosthetic heart valve: Secondary | ICD-10-CM

## 2013-06-08 LAB — PROTIME-INR
INR: 3.12 — ABNORMAL HIGH (ref 0.00–1.49)
Prothrombin Time: 31 seconds — ABNORMAL HIGH (ref 11.6–15.2)

## 2013-06-08 LAB — BASIC METABOLIC PANEL
BUN: 31 mg/dL — ABNORMAL HIGH (ref 6–23)
BUN: 29 mg/dL — AB (ref 6–23)
CHLORIDE: 90 meq/L — AB (ref 96–112)
CHLORIDE: 91 meq/L — AB (ref 96–112)
CO2: 30 meq/L (ref 19–32)
CO2: 31 mEq/L (ref 19–32)
CREATININE: 1.71 mg/dL — AB (ref 0.50–1.10)
Calcium: 8.3 mg/dL — ABNORMAL LOW (ref 8.4–10.5)
Calcium: 8.5 mg/dL (ref 8.4–10.5)
Creatinine, Ser: 1.57 mg/dL — ABNORMAL HIGH (ref 0.50–1.10)
GFR calc Af Amer: 37 mL/min — ABNORMAL LOW (ref >90)
GFR calc non Af Amer: 29 mL/min — ABNORMAL LOW (ref >90)
GFR calc non Af Amer: 32 mL/min — ABNORMAL LOW (ref >90)
GFR, EST AFRICAN AMERICAN: 33 mL/min — AB (ref >90)
Glucose, Bld: 114 mg/dL — ABNORMAL HIGH (ref 70–99)
Glucose, Bld: 185 mg/dL — ABNORMAL HIGH (ref 70–99)
POTASSIUM: 3.5 meq/L — AB (ref 3.7–5.3)
Potassium: 3.2 mEq/L — ABNORMAL LOW (ref 3.7–5.3)
Sodium: 133 mEq/L — ABNORMAL LOW (ref 137–147)
Sodium: 136 mEq/L — ABNORMAL LOW (ref 137–147)

## 2013-06-08 LAB — MAGNESIUM: Magnesium: 2.2 mg/dL (ref 1.5–2.5)

## 2013-06-08 MED ORDER — POTASSIUM CHLORIDE CRYS ER 20 MEQ PO TBCR
40.0000 meq | EXTENDED_RELEASE_TABLET | Freq: Three times a day (TID) | ORAL | Status: DC
  Administered 2013-06-08 – 2013-06-09 (×6): 40 meq via ORAL
  Filled 2013-06-08 (×10): qty 2

## 2013-06-08 MED ORDER — AMIODARONE LOAD VIA INFUSION
150.0000 mg | Freq: Once | INTRAVENOUS | Status: AC
  Administered 2013-06-08: 150 mg via INTRAVENOUS
  Filled 2013-06-08: qty 83.34

## 2013-06-08 MED ORDER — MAGNESIUM SULFATE 40 MG/ML IJ SOLN
2.0000 g | Freq: Once | INTRAMUSCULAR | Status: AC
  Administered 2013-06-08: 2 g via INTRAVENOUS
  Filled 2013-06-08: qty 50

## 2013-06-08 MED ORDER — SODIUM CHLORIDE 0.9 % IJ SOLN
10.0000 mL | Freq: Two times a day (BID) | INTRAMUSCULAR | Status: DC
  Administered 2013-06-08 – 2013-06-10 (×2): 10 mL
  Administered 2013-06-11: 30 mL
  Administered 2013-06-11: 10 mL
  Administered 2013-06-12: 30 mL

## 2013-06-08 MED ORDER — METOLAZONE 2.5 MG PO TABS
2.5000 mg | ORAL_TABLET | Freq: Once | ORAL | Status: AC
  Administered 2013-06-08: 2.5 mg via ORAL
  Filled 2013-06-08: qty 1

## 2013-06-08 MED ORDER — SODIUM CHLORIDE 0.9 % IJ SOLN
10.0000 mL | INTRAMUSCULAR | Status: DC | PRN
  Administered 2013-06-09: 10 mL
  Administered 2013-06-12: 30 mL
  Administered 2013-06-13: 20 mL

## 2013-06-08 NOTE — Progress Notes (Addendum)
Advanced Heart Failure Rounding Note Primary Physician: Dr. Quintin Alto  Primary Cardiologist: Dr. Harl Bowie  Reason for Admission: A/C HF  Subjective:    [name] [name] is a 73 yo obese white female with known history of rheumatic fever during childhood and long standing history of chronic diastolic congestive heart failure. She also has a history of CKD, HTN and PAF. The patient has been admitted several times with recurrent exacerbations of CHF. Progressed to severe symptomatic Mitral Regurgitation with Paroxysmal Atrial Fibrillation and underwent minimally invasive maze procedure with MV replacement (05/16/13). Was discharged 05/31/13 to [facility] center and weight was 207 lbs.  Presented to the ED 3/30 after going to Dr. Guy Sandifer office for follow up. She had marked volume overload, diarrhea and rapid afib with RVR. Started on IV lasix, amiodarone gtt and vancomycin (cellulitis). TEE/DC-CV 06/06/13 which was successful and remains in NSR. Remains on Lasix gtt 15 mg/hr along with milrinone 0.25 which was added yesterday. Weight down 2 lbs, 24 hr I/O -1.7 liters. Working with PT.  ECHO 3/31: EF 40-45%, RV mildly dilated and sys fx mildly reduced  Objective:   Weight Range:  Vital Signs:   Temp:  [97.3 F (36.3 C)-98.5 F (36.9 C)] 97.9 F (36.6 C) (04/03 0359) Pulse Rate:  [74-81] 76 (04/03 0359) Resp:  [13-25] 23 (04/02 2000) BP: (106-126)/(32-62) 110/32 mmHg (04/03 0359) SpO2:  [95 %-97 %] 96 % (04/03 0359) Weight:  [204 lb 12.8 oz (92.897 kg)] 204 lb 12.8 oz (92.897 kg) (04/03 0359) Last BM Date: 06/04/13  Weight change: Filed Weights   06/07/13 0342 06/07/13 0351 06/08/13 0359  Weight: 206 lb 9.1 oz (93.7 kg) 206 lb 9.1 oz (93.7 kg) 204 lb 12.8 oz (92.897 kg)    Intake/Output:   Intake/Output Summary (Last 24 hours) at 06/08/13 0704 Last data filed at 06/08/13 0400  Gross per 24 hour  Intake 1458.7 ml  Output   3200 ml  Net -1741.3 ml     Physical Exam: General: Chronically ill  appearing. No resp difficulty  HEENT: normal  Neck: supple. JVP 10. Carotids 2+ bilat; no bruits. No lymphadenopathy or thryomegaly appreciated.  Cor: PMI nondisplaced. Tachy rate & regular rhythm. No rubs, gallops or murmurs.  Lungs: clear  Abdomen: soft, nontender, +distended. No hepatosplenomegaly. No bruits or masses. Good bowel sounds.  Extremities: no cyanosis, clubbing, rash, bilateral LE 2+ edema to thighs with erythema  Neuro: alert & orientedx3, cranial nerves grossly intact. moves all 4 extremities w/o difficulty. Affect pleasant  Telemetry: SR-ST 90-115s  Labs: Basic Metabolic Panel:  Recent Labs Lab 06/05/13 1425 06/06/13 0235 06/07/13 0253 06/07/13 1919 06/08/13 0338  NA 139 138 136* 136* 133*  K 4.6 4.6 4.0 3.4* 3.2*  CL 99 97 94* 91* 90*  CO2 28 26 27 31 30   GLUCOSE 133* 100* 96 173* 114*  BUN 38* 33* 35* 34* 31*  CREATININE 1.80* 1.68* 1.73* 1.72* 1.71*  CALCIUM 8.9 8.1* 8.7 8.6 8.3*    Liver Function Tests: No results found for this basename: AST, ALT, ALKPHOS, BILITOT, PROT, ALBUMIN,  in the last 168 hours No results found for this basename: LIPASE, AMYLASE,  in the last 168 hours No results found for this basename: AMMONIA,  in the last 168 hours  CBC:  Recent Labs Lab 06/04/13 1248 06/06/13 0235  WBC 6.0 5.5  NEUTROABS  --  3.9  HGB 9.5* 9.0*  HCT 30.1* 28.2*  MCV 100.7* 99.6  PLT 239 220    Cardiac Enzymes: No  results found for this basename: CKTOTAL, CKMB, CKMBINDEX, TROPONINI,  in the last 168 hours  BNP: BNP (last 3 results)  Recent Labs  05/25/13 0402 05/28/13 0528 06/04/13 1248  PROBNP 5322.0* 3515.0* 5763.0*    Imaging: No results found.   Medications:     Scheduled Medications: . darifenacin  7.5 mg Oral Daily  . iron polysaccharides  150 mg Oral Daily  . simvastatin  10 mg Oral q1800  . sodium chloride  3 mL Intravenous Q12H  . sodium chloride  3 mL Intravenous Q12H  . spironolactone  25 mg Oral Daily  .  vancomycin  1,000 mg Intravenous Q24H  . Warfarin - Pharmacist Dosing Inpatient   Does not apply q1800    Infusions: . sodium chloride    . amiodarone (NEXTERONE PREMIX) 360 mg/200 mL dextrose 30 mg/hr (06/08/13 0132)  . furosemide (LASIX) infusion 15 mg/hr (06/07/13 2241)  . milrinone 0.25 mcg/kg/min (06/07/13 1959)    PRN Medications: sodium chloride, acetaminophen, benzonatate, guaiFENesin, sodium chloride, sodium chloride, traMADol   Assessment/Plan/Discussion   1) A/C primarily diastolic CHF: - Remains on lasix gtt 15 mg/hr along with milrinone 0.25 mg. UOP up a little, but still volume overloaded. Cr stable. Will increase lasix gtt to 20 mg/hr along with another dose of 2.5 mg metolazone. Will get BMET this afternoon. Will place PICC and follow co-oxs and CVPs. EF slightly down 40-45% and RV mildly dilated and sys fx mildly reduced. Not currently on a BB. No ACE-I with CRI. Continue spiro 25 mg daily. Over the weekend would continue to diurese her until her BUN/Ct start start increasing and then wean milrinone. I think she still has about 10 lbs of fluid on board. Will supplement K+ aggressively and give 2 mg magnesium today. 2) Atypical atrial flutter with RVR.  Went for TEE/DC-CV 4/1. She is in NSR 80s this am, but appears to be going in and out of atypical atrial flutter. Will give 150 mg IV bolus of amiodarone and continue gtt.  4) CKD  - baseline 1.68. Stable. Will continue to follow, BMET this afternoon.  5) Cellulitis - LE erythema and warm to touch on arrival temp 101.5. Has been afebrile since admission. Will continue IV vancomycin per pharmacy.   Continue to work with PT and would like to go to [facility] at D/C  Length of Stay: 4 Rande Brunt NP-C 06/08/2013, 7:04 AM  Advanced Heart Failure Team Pager [phone] (M-F; 7a - 4p)  Please contact [facility] for night-coverage after hours (4p -7a ) and weekends on [email or web address]  Patient seen with NP, agree with  the above note.   Patient diuresed better yesterday with milrinone for CO augmentation (EF 40-45% with RV dysfunction).  She occasionally goes into atypical atrial flutter with rate in 100s-110s.  She still is volume overloaded.  - Agree with increase Lasix gtt to 20 mg/hr and replete K/Mg.  Repeat BMET in afternoon.  - Dose of metolazone today.  - Continue amiodarone gtt, bolus this am.  - Continue warfarin.  - Continue abx for cellulitis. - CKD: stable renal function.   [name] 06/08/2013 7:29 AM

## 2013-06-08 NOTE — Progress Notes (Signed)
Provided support to pt, who became tearful when explaining she is eager to get out of the hospital and to [facility]. Pt states "I just want to be in [place]" and wants to be closer to family. Pt states family is coming to visit her this evening. Provided emotional support to pt, who thanked me and states medical team has been very caring. Will continue to follow case and provide support to pt.   Ky Barban, MSW, [facility] Clinical Social Worker [phone]

## 2013-06-08 NOTE — Progress Notes (Signed)
Peripherally Inserted Central Catheter/Midline Placement  The IV Nurse has discussed with the patient and/or persons authorized to consent for the patient, the purpose of this procedure and the potential benefits and risks involved with this procedure.  The benefits include less needle sticks, lab draws from the catheter and patient may be discharged home with the catheter.  Risks include, but not limited to, infection, bleeding, blood clot (thrombus formation), and puncture of an artery; nerve damage and irregular heat beat.  Alternatives to this procedure were also discussed.  PICC/Midline Placement Documentation  PICC Triple Lumen 74/16/38 PICC Right Basilic 41 cm 1 cm (Active)  Indication for Insertion or Continuance of Line Poor Vasculature-patient has had multiple peripheral attempts or PIVs lasting less than 24 hours 06/08/2013  5:00 PM  Exposed Catheter (cm) 1 cm 06/08/2013  5:00 PM  Dressing Change Due 06/15/13 06/08/2013  5:00 PM       Jule Economy Horton 06/08/2013, 5:41 PM

## 2013-06-08 NOTE — Progress Notes (Signed)
Patient getting a lot of meds in her Peripheral Iv and she started c/o the site  hurting ,red and some edema noted called to get order for PICC / poor veins MD returned call IV to place PICC

## 2013-06-08 NOTE — Progress Notes (Signed)
Pharmacy Consult Note  Pharmacy Consult for Coumadin Indication: atrial fibrillation  Pharmacy Consult for Vancomycin Indication: Cellulitis  Allergies  Allergen Reactions  . Indomethacin Other (See Comments)    dizziness  . Norvasc [Amlodipine Besylate] Cough   Labs:  Recent Labs  06/06/13 0235 06/07/13 0253 06/07/13 1919 06/08/13 0338  HGB 9.0*  --   --   --   HCT 28.2*  --   --   --   PLT 220  --   --   --   LABPROT 32.7* 30.6*  --  31.0*  INR 3.35* 3.07*  --  3.12*  CREATININE 1.68* 1.73* 1.72* 1.71*    Estimated Creatinine Clearance: 31.5 ml/min (by C-G formula based on Cr of 1.71).   Assessment: 72yof admitted from SNF for wight gain, orthopnea, low grade fever, diarrhea.  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   1.AFib: She is anticoagulated with Coumadin INR now  after low dosing the past few days. Successful cardioversion 4/1. Patient continues on IV amiodarone, will rebolus this morning. No bleeding noted, will hold warfarin today and attempt to let inr drift down. May need to check cbc over weekend.  2. Cellulitis: no fevers, wbc normal, cellulitis appears to be improving. Renal function impaired but fairly stable. Patient currently on day #4 of vancomycin. No culture data. Will plan on checking trough over weekend if continues.  Per med rec home Coumadin dose 2mg  MWF, 3mg  all other days.    Goal of Therapy:  INR 2-3 Monitor platelets by anticoagulation protocol: Yes Vancomycin trough 10-15   Plan:  Hold Coumadin today Daily Protime Continue vancomycin 1g IV q24 hours - check trough over weekend  Erin Hearing PharmD., [facility] Clinical Pharmacist Pager [phone] 06/08/2013 7:34 AM

## 2013-06-08 NOTE — Progress Notes (Unsigned)
Clinical Summary [name] [name] is a 73 y.o.female   Past Medical History  Diagnosis Date  . Chronic airway obstruction, not elsewhere classified   . Precordial pain   . Swelling of limb   . Dizziness and giddiness   . Unspecified transient cerebral ischemia     Diag. 2003  . Acute, but ill-defined, cerebrovascular disease     2000 [place]  . Shortness of breath   . Obesity   . Juvenile rheumatic fever     age 64  . Unspecified essential hypertension   . Hyperlipidemia   . Mitral regurgitation 02/16/2013  . Chronic kidney disease   . CHF (congestive heart failure) 04/23/2013  . Paroxysmal atrial fibrillation 02/16/2013    Recurrent paroxysmal atrial fibrillation, first diagnosed December 2014  . Chronic diastolic congestive heart failure   . Arthritis     Chronic left knee pain  . Aortic insufficiency due to bicuspid aortic valve     moderate by TEE  . 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 S/P Maze operation for atrial fibrillation 05/16/2013    Complete bilateral atrial lesion set using cryothermy and bipolar radiofrequency ablation with oversewing of LA appendage     Allergies  Allergen Reactions  . Indomethacin Other (See Comments)    dizziness  . Norvasc [Amlodipine Besylate] Cough     No current facility-administered medications for this visit.   No current outpatient prescriptions on file.   Facility-Administered Medications Ordered in Other Visits  Medication Dose Route Frequency Provider Last Rate Last Dose  . 0.9 %  sodium chloride infusion  250 mL Intravenous PRN Rande Brunt, NP 10 mL/hr at 06/07/13 2000 250 mL at 06/07/13 2000  . 0.9 %  sodium chloride infusion  250 mL Intravenous Continuous Rande Brunt, NP      . acetaminophen (TYLENOL) tablet 650 mg  650 mg Oral Q6H PRN Rande Brunt, NP    650 mg at 06/07/13 2123  . amiodarone (NEXTERONE PREMIX) 360 mg/200 mL dextrose IV infusion  30 mg/hr Intravenous Continuous Larey Dresser, MD 16.7 mL/hr at 06/08/13 0742 30 mg/hr at 06/08/13 0742  . benzonatate (TESSALON) capsule 100 mg  100 mg Oral BID PRN Larey Dresser, MD   100 mg at 06/07/13 2239  . darifenacin (ENABLEX) 24 hr tablet 7.5 mg  7.5 mg Oral Daily Rande Brunt, NP   7.5 mg at 06/08/13 1013  . furosemide (LASIX) 250 mg in dextrose 5 % 250 mL infusion  20 mg/hr Intravenous Continuous Larey Dresser, MD 20 mL/hr at 06/08/13 0740 20 mg/hr at 06/08/13 0740  . guaiFENesin (MUCINEX) 12 hr tablet 600 mg  600 mg Oral Q12H PRN Rande Brunt, NP      . iron polysaccharides (NIFEREX) capsule 150 mg  150 mg Oral Daily Rande Brunt, NP   150 mg at 06/08/13 1013  . milrinone (PRIMACOR) infusion 200 mcg/mL (0.2 mg/ml)  0.25 mcg/kg/min Intravenous Continuous Larey Dresser, MD 7 mL/hr at 06/08/13 0835 0.25 mcg/kg/min at 06/08/13 0835  . potassium chloride SA (K-DUR,KLOR-CON) CR tablet 40 mEq  40 mEq Oral TID Larey Dresser, MD   40 mEq at 06/08/13 0831  . simvastatin (ZOCOR) tablet 10 mg  10 mg Oral q1800 Rande Brunt, NP   10 mg at 06/07/13 1721  .  sodium chloride 0.9 % injection 3 mL  3 mL Intravenous Q12H Rande Brunt, NP   3 mL at 06/07/13 2049  . sodium chloride 0.9 % injection 3 mL  3 mL Intravenous PRN Rande Brunt, NP      . sodium chloride 0.9 % injection 3 mL  3 mL Intravenous Q12H Rande Brunt, NP   3 mL at 06/07/13 2048  . sodium chloride 0.9 % injection 3 mL  3 mL Intravenous PRN Rande Brunt, NP      . spironolactone (ALDACTONE) tablet 25 mg  25 mg Oral Daily Larey Dresser, MD   25 mg at 06/08/13 1013  . traMADol (ULTRAM) tablet 50 mg  50 mg Oral Q12H PRN Rande Brunt, NP   50 mg at 06/04/13 2118  . vancomycin (VANCOCIN) IVPB 1000 mg/200 mL premix  1,000 mg Intravenous Q24H Tomasita Morrow, RPH   1,000 mg at 06/07/13 1545  . Warfarin - Pharmacist Dosing  Inpatient   Does not apply G2694 Larey Dresser, MD         Past Surgical History  Procedure Laterality Date  . Total knee arthroplasty Right   . Abdominal hysterectomy      Cervical Cancer  . Parathyroid/thyroid surgery      tumor  . Tee without cardioversion N/A 04/06/2013    Procedure: TRANSESOPHAGEAL ECHOCARDIOGRAM (TEE);  Surgeon: [name], MD;  [facility];  Service: Cardiology;  Laterality: N/A;  . Minimally invasive maze procedure N/A 05/16/2013    Procedure: MINIMALLY INVASIVE MAZE PROCEDURE;  Surgeon: Rexene Alberts, MD;  Location: [place];  Service: Open Heart Surgery;  Laterality: N/A;  . Intraoperative transesophageal echocardiogram N/A 05/16/2013    Procedure: INTRAOPERATIVE TRANSESOPHAGEAL ECHOCARDIOGRAM;  Surgeon: Rexene Alberts, MD;  Location: [place];  Service: Open Heart Surgery;  Laterality: N/A;  . Mitral valve replacement Right 05/16/2013    Procedure: MINIMALLY INVASIVE MITRAL VALVE (MV) REPLACEMENT;  Surgeon: Rexene Alberts, MD;  Location: [place];  Service: Open Heart Surgery;  Laterality: Right;  . Tee without cardioversion N/A 06/06/2013    Procedure: TRANSESOPHAGEAL ECHOCARDIOGRAM (TEE);  Surgeon: Larey Dresser, MD;  Location: [place];  Service: Cardiovascular;  Laterality: N/A;  . Cardioversion N/A 06/06/2013    Procedure: CARDIOVERSION;  Surgeon: Larey Dresser, MD;  Location: [place];  Service: Cardiovascular;  Laterality: N/A;     Allergies  Allergen Reactions  . Indomethacin Other (See Comments)    dizziness  . Norvasc [Amlodipine Besylate] Cough      Family History  Problem Relation Age of Onset  . Heart failure Father   . Heart attack Brother      Social History [name] [name] reports that she has never smoked. She has never used smokeless tobacco. [name] [name] reports that she does not drink alcohol.   Review of Systems CONSTITUTIONAL: No weight loss, fever, chills, weakness or fatigue.  HEENT: Eyes: No visual  loss, blurred vision, double vision or yellow sclerae.No hearing loss, sneezing, congestion, runny nose or sore throat.  SKIN: No rash or itching.  CARDIOVASCULAR:  RESPIRATORY: No shortness of breath, cough or sputum.  GASTROINTESTINAL: No anorexia, nausea, vomiting or diarrhea. No abdominal pain or blood.  GENITOURINARY: No burning on urination, no polyuria NEUROLOGICAL: No headache, dizziness, syncope, paralysis, ataxia, numbness or tingling in the extremities. No change in bowel or bladder control.  MUSCULOSKELETAL: No muscle, back pain, joint pain or stiffness.  LYMPHATICS: No enlarged  nodes. No history of splenectomy.  PSYCHIATRIC: No history of depression or anxiety.  ENDOCRINOLOGIC: No reports of sweating, cold or heat intolerance. No polyuria or polydipsia.  Marland Kitchen   Physical Examination There were no vitals filed for this visit. There were no vitals filed for this visit.  Gen: resting comfortably, no acute distress HEENT: no scleral icterus, pupils equal round and reactive, no palptable cervical adenopathy,  CV Resp: Clear to auscultation bilaterally GI: abdomen is soft, non-tender, non-distended, normal bowel sounds, no hepatosplenomegaly MSK: extremities are warm, no edema.  Skin: warm, no rash Neuro:  no focal deficits Psych: appropriate affect   Diagnostic Studies     Assessment and Plan        [name], M.D., F.A.C.C.

## 2013-06-09 LAB — CBC
HEMATOCRIT: 26.9 % — AB (ref 36.0–46.0)
Hemoglobin: 8.6 g/dL — ABNORMAL LOW (ref 12.0–15.0)
MCH: 30.9 pg (ref 26.0–34.0)
MCHC: 32 g/dL (ref 30.0–36.0)
MCV: 96.8 fL (ref 78.0–100.0)
Platelets: 248 10*3/uL (ref 150–400)
RBC: 2.78 MIL/uL — AB (ref 3.87–5.11)
RDW: 16 % — ABNORMAL HIGH (ref 11.5–15.5)
WBC: 6.9 10*3/uL (ref 4.0–10.5)

## 2013-06-09 LAB — CARBOXYHEMOGLOBIN
CARBOXYHEMOGLOBIN: 1.6 % — AB (ref 0.5–1.5)
Methemoglobin: 0.8 % (ref 0.0–1.5)
O2 Saturation: 85.9 %
Total hemoglobin: 9.8 g/dL — ABNORMAL LOW (ref 12.0–16.0)

## 2013-06-09 LAB — BASIC METABOLIC PANEL
BUN: 27 mg/dL — AB (ref 6–23)
CHLORIDE: 91 meq/L — AB (ref 96–112)
CO2: 33 mEq/L — ABNORMAL HIGH (ref 19–32)
Calcium: 8.6 mg/dL (ref 8.4–10.5)
Creatinine, Ser: 1.55 mg/dL — ABNORMAL HIGH (ref 0.50–1.10)
GFR calc Af Amer: 37 mL/min — ABNORMAL LOW (ref >90)
GFR calc non Af Amer: 32 mL/min — ABNORMAL LOW (ref >90)
GLUCOSE: 112 mg/dL — AB (ref 70–99)
POTASSIUM: 4.2 meq/L (ref 3.7–5.3)
Sodium: 136 mEq/L — ABNORMAL LOW (ref 137–147)

## 2013-06-09 LAB — PROTIME-INR
INR: 2.83 — ABNORMAL HIGH (ref 0.00–1.49)
Prothrombin Time: 28.8 seconds — ABNORMAL HIGH (ref 11.6–15.2)

## 2013-06-09 LAB — VANCOMYCIN, TROUGH: Vancomycin Tr: 28.8 ug/mL (ref 10.0–20.0)

## 2013-06-09 MED ORDER — POTASSIUM CHLORIDE CRYS ER 20 MEQ PO TBCR
20.0000 meq | EXTENDED_RELEASE_TABLET | Freq: Once | ORAL | Status: AC
  Administered 2013-06-09: 20 meq via ORAL

## 2013-06-09 MED ORDER — VANCOMYCIN HCL IN DEXTROSE 1-5 GM/200ML-% IV SOLN
1000.0000 mg | INTRAVENOUS | Status: AC
  Administered 2013-06-10: 1000 mg via INTRAVENOUS
  Filled 2013-06-09 (×2): qty 200

## 2013-06-09 MED ORDER — METOLAZONE 2.5 MG PO TABS
2.5000 mg | ORAL_TABLET | Freq: Once | ORAL | Status: AC
  Administered 2013-06-09: 2.5 mg via ORAL
  Filled 2013-06-09: qty 1

## 2013-06-09 MED ORDER — WARFARIN 0.5 MG HALF TABLET
0.5000 mg | ORAL_TABLET | Freq: Once | ORAL | Status: AC
  Administered 2013-06-09: 0.5 mg via ORAL
  Filled 2013-06-09: qty 1

## 2013-06-09 MED ORDER — AMIODARONE LOAD VIA INFUSION
150.0000 mg | Freq: Once | INTRAVENOUS | Status: AC
  Administered 2013-06-09: 150 mg via INTRAVENOUS
  Filled 2013-06-09: qty 83.34

## 2013-06-09 NOTE — Progress Notes (Signed)
[name], female   DOB: 12/15/1940, 73 y.o.   MRN: [id number] Advanced Heart Failure Rounding Note Primary Physician: Dr. Quintin Alto  Primary Cardiologist: Dr. Harl Bowie  Reason for Admission: A/C HF  Subjective:    Ms. Adamczak is a 73 yo obese white female with known history of rheumatic fever during childhood and long standing history of chronic diastolic congestive heart failure. She also has a history of CKD, HTN and PAF. The patient has been admitted several times with recurrent exacerbations of CHF. Progressed to severe symptomatic Mitral Regurgitation with Paroxysmal Atrial Fibrillation and underwent minimally invasive maze procedure with MV replacement (05/16/13). Was discharged 05/31/13 to [facility] center and weight was 207 lbs.  Presented to the ED 3/30 after going to Dr. Guy Sandifer office for follow up. She had marked volume overload, diarrhea and rapid afib with RVR. Started on IV lasix, amiodarone gtt and vancomycin (cellulitis). TEE/DC-CV 06/06/13 which was successful and remains in NSR but has runs of atrial fibrillation so still on amiodarone gtt. Remains on Lasix gtt 20 mg/hr along with milrinone 0.25. Good diuresis yesterday, weight falling.   ECHO 3/31: EF 40-45%, RV mildly dilated and sys fx mildly reduced  Objective:   Weight Range:  Vital Signs:   Temp:  [98 F (36.7 C)-98.9 F (37.2 C)] 98.9 F (37.2 C) (04/03 2000) Pulse Rate:  [72-123] 90 (04/04 0500) Resp:  [19-28] 20 (04/04 0500) BP: (105-142)/(38-65) 120/44 mmHg (04/04 0400) SpO2:  [92 %-98 %] 92 % (04/04 0500) Weight:  [197 lb 1.5 oz (89.4 kg)] 197 lb 1.5 oz (89.4 kg) (04/04 0500) Last BM Date: 06/04/13  Weight change: Filed Weights   06/07/13 0351 06/08/13 0359 06/09/13 0500  Weight: 206 lb 9.1 oz (93.7 kg) 204 lb 12.8 oz (92.897 kg) 197 lb 1.5 oz (89.4 kg)    Intake/Output:   Intake/Output Summary (Last 24 hours) at 06/09/13 0729 Last data filed at 06/09/13 0600  Gross per 24 hour  Intake  1834.37 ml  Output   3200 ml  Net -1365.63 ml     Physical Exam: General: Chronically ill appearing. No resp difficulty  HEENT: normal  Neck: supple. JVP 10. Carotids 2+ bilat; no bruits. No lymphadenopathy or thryomegaly appreciated.  Cor: PMI nondisplaced. Tachy rate & regular rhythm. No rubs, gallops or murmurs.  Lungs: clear  Abdomen: soft, nontender, +distended. No hepatosplenomegaly. No bruits or masses. Good bowel sounds.  Extremities: no cyanosis, clubbing, rash, bilateral LE 2+ edema to thighs with erythema  Neuro: alert & orientedx3, cranial nerves grossly intact. moves all 4 extremities w/o difficulty. Affect pleasant  Telemetry: SR-ST 90-115s  Labs: Basic Metabolic Panel:  Recent Labs Lab 06/07/13 0253 06/07/13 1919 06/08/13 0338 06/08/13 1540 06/09/13 0500  NA 136* 136* 133* 136* 136*  K 4.0 3.4* 3.2* 3.5* 4.2  CL 94* 91* 90* 91* 91*  CO2 27 31 30 31  33*  GLUCOSE 96 173* 114* 185* 112*  BUN 35* 34* 31* 29* 27*  CREATININE 1.73* 1.72* 1.71* 1.57* 1.55*  CALCIUM 8.7 8.6 8.3* 8.5 8.6  MG  --   --   --  2.2  --     Liver Function Tests: No results found for this basename: AST, ALT, ALKPHOS, BILITOT, PROT, ALBUMIN,  in the last 168 hours No results found for this basename: LIPASE, AMYLASE,  in the last 168 hours No results found for this basename: AMMONIA,  in the last 168 hours  CBC:  Recent Labs Lab 06/04/13 1248 06/06/13 0235  06/09/13 0500  WBC 6.0 5.5 6.9  NEUTROABS  --  3.9  --   HGB 9.5* 9.0* 8.6*  HCT 30.1* 28.2* 26.9*  MCV 100.7* 99.6 96.8  PLT 239 220 248    Cardiac Enzymes: No results found for this basename: CKTOTAL, CKMB, CKMBINDEX, TROPONINI,  in the last 168 hours  BNP: BNP (last 3 results)  Recent Labs  05/25/13 0402 05/28/13 0528 06/04/13 1248  PROBNP 5322.0* 3515.0* 5763.0*    Imaging: No results found.   Medications:     Scheduled Medications: . darifenacin  7.5 mg Oral Daily  . iron polysaccharides  150 mg  Oral Daily  . metolazone  2.5 mg Oral Once  . potassium chloride  20 mEq Oral Once  . potassium chloride  40 mEq Oral TID  . simvastatin  10 mg Oral q1800  . sodium chloride  10-40 mL Intracatheter Q12H  . sodium chloride  3 mL Intravenous Q12H  . sodium chloride  3 mL Intravenous Q12H  . spironolactone  25 mg Oral Daily  . vancomycin  1,000 mg Intravenous Q24H  . Warfarin - Pharmacist Dosing Inpatient   Does not apply q1800    Infusions: . sodium chloride    . amiodarone (NEXTERONE PREMIX) 360 mg/200 mL dextrose 30 mg/hr (06/09/13 0404)  . furosemide (LASIX) infusion 20 mg/hr (06/09/13 0056)  . 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 She is diuresing better now on milrinone/Lasix gtt + metolazone. Weight is falling and creatinine is stable. She now has a PICC.  I think she still needs more diuresis.  - Continue current Lasix gtt + milrinone and will give metolazone 2.5 mg x 1.  - Follow CVP by PICC, check today.  - Follow co-ox.  2) Atypical atrial flutter with RVR.  Went for TEE/DC-CV 4/1. She is in NSR 80s this am, but has periodic episodes of atrial flutter.  Continue amiodarone gtt while on milrinone.  4) CKD: Stable creatinine.  5) Cellulitis: LE erythema and warm to touch on arrival temp 101.5. Has been afebrile since admission. Will continue IV vancomycin per pharmacy.  Continue to work with PT and would like to go to [facility] at D/C  Length of Stay: [place] 06/09/2013, 7:29 AM  Advanced Heart Failure Team Pager [phone] (M-F; [place])  Please contact [facility] for night-coverage after hours (4p -7a ) and weekends on [email or web address]

## 2013-06-09 NOTE — Progress Notes (Addendum)
Pharmacy Consult Note  Pharmacy Consult for Coumadin Indication: atrial fibrillation  Allergies  Allergen Reactions  . Indomethacin Other (See Comments)    dizziness  . Norvasc [Amlodipine Besylate] Cough   Labs:  Recent Labs  06/07/13 0253  06/08/13 0338 06/08/13 1540 06/09/13 0500  HGB  --   --   --   --  8.6*  HCT  --   --   --   --  26.9*  PLT  --   --   --   --  248  LABPROT 30.6*  --  31.0*  --  28.8*  INR 3.07*  --  3.12*  --  2.83*  CREATININE 1.73*  < > 1.71* 1.57* 1.55*  < > = values in this interval not displayed.  Estimated Creatinine Clearance: 34.1 ml/min (by C-G formula based on Cr of 1.55).   Assessment: 72yof admitted from SNF for wight gain, orthopnea, low grade fever, diarrhea.  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   She is anticoagulated with Coumadin INR now 2.83 trending down after low dosing the past few days and hold x 1 yesterday. Successful cardioversion 4/1. Patient does continue on IV amiodarone. No bleeding issues noted. CBC appears stable.  Per med rec home Coumadin dose 2mg  MWF, 3mg  all other days.    Goal of Therapy:  INR 2-3 Monitor platelets by anticoagulation protocol: Yes Vancomycin trough 10-15   Plan:  Coumadin 0.5 mg x 1 tonight to maintain warfarin therapeutic levels.  Daily INR  Alvin B. Leitha Schuller, PharmD Clinical Pharmacist - Resident Phone: [phone] Pager: [phone] 06/09/2013 12:12 PM   Addendum: Vancomycin trough is 28.8 on 1g IV q24. Will decrease dose to 1g IV q48h, next dose tomorrow.  Heide Guile, PharmD, BCPS Clinical Pharmacist Pager [phone]

## 2013-06-10 LAB — BASIC METABOLIC PANEL
BUN: 26 mg/dL — ABNORMAL HIGH (ref 6–23)
CALCIUM: 8.7 mg/dL (ref 8.4–10.5)
CO2: 33 mEq/L — ABNORMAL HIGH (ref 19–32)
Chloride: 89 mEq/L — ABNORMAL LOW (ref 96–112)
Creatinine, Ser: 1.54 mg/dL — ABNORMAL HIGH (ref 0.50–1.10)
GFR calc Af Amer: 38 mL/min — ABNORMAL LOW (ref >90)
GFR calc non Af Amer: 33 mL/min — ABNORMAL LOW (ref >90)
Glucose, Bld: 174 mg/dL — ABNORMAL HIGH (ref 70–99)
POTASSIUM: 5.2 meq/L (ref 3.7–5.3)
SODIUM: 134 meq/L — AB (ref 137–147)

## 2013-06-10 LAB — CBC
HEMATOCRIT: 26.5 % — AB (ref 36.0–46.0)
Hemoglobin: 8.5 g/dL — ABNORMAL LOW (ref 12.0–15.0)
MCH: 31.4 pg (ref 26.0–34.0)
MCHC: 32.1 g/dL (ref 30.0–36.0)
MCV: 97.8 fL (ref 78.0–100.0)
Platelets: 251 10*3/uL (ref 150–400)
RBC: 2.71 MIL/uL — AB (ref 3.87–5.11)
RDW: 16.1 % — ABNORMAL HIGH (ref 11.5–15.5)
WBC: 6.8 10*3/uL (ref 4.0–10.5)

## 2013-06-10 LAB — CARBOXYHEMOGLOBIN
CARBOXYHEMOGLOBIN: 2 % — AB (ref 0.5–1.5)
METHEMOGLOBIN: 0.9 % (ref 0.0–1.5)
O2 SAT: 82.4 %
Total hemoglobin: 8.6 g/dL — ABNORMAL LOW (ref 12.0–16.0)

## 2013-06-10 LAB — PROTIME-INR
INR: 2.33 — ABNORMAL HIGH (ref 0.00–1.49)
Prothrombin Time: 24.8 seconds — ABNORMAL HIGH (ref 11.6–15.2)

## 2013-06-10 MED ORDER — METOLAZONE 2.5 MG PO TABS
2.5000 mg | ORAL_TABLET | Freq: Once | ORAL | Status: AC
  Administered 2013-06-10: 2.5 mg via ORAL
  Filled 2013-06-10: qty 1

## 2013-06-10 MED ORDER — POTASSIUM CHLORIDE CRYS ER 20 MEQ PO TBCR
40.0000 meq | EXTENDED_RELEASE_TABLET | Freq: Two times a day (BID) | ORAL | Status: DC
  Administered 2013-06-10 – 2013-06-11 (×4): 40 meq via ORAL
  Filled 2013-06-10 (×4): qty 2

## 2013-06-10 MED ORDER — WARFARIN SODIUM 1 MG PO TABS
1.0000 mg | ORAL_TABLET | Freq: Once | ORAL | Status: AC
  Administered 2013-06-10: 1 mg via ORAL
  Filled 2013-06-10: qty 1

## 2013-06-10 NOTE — Progress Notes (Signed)
[name], female   DOB: 1940-10-22, 73 y.o.   MRN: [id number] Advanced Heart Failure Rounding Note Primary Physician: Dr. Quintin Alto  Primary Cardiologist: Dr. Harl Bowie  Reason for Admission: A/C HF  Subjective:    [name] [name] is a 73 yo obese [name] female with known history of rheumatic fever during childhood and long standing history of chronic diastolic congestive heart failure. She also has a history of CKD, HTN and PAF. The patient has been admitted several times with recurrent exacerbations of CHF. Progressed to severe symptomatic Mitral Regurgitation with Paroxysmal Atrial Fibrillation and underwent minimally invasive maze procedure with MV replacement (05/16/13). Was discharged 05/31/13 to [facility] center and weight was 207 lbs.  Presented to the ED 3/30 after going to Dr. Guy Sandifer office for follow up. She had marked volume overload, diarrhea and rapid afib with RVR. Started on IV lasix, amiodarone gtt and vancomycin (cellulitis). TEE/DC-CV 06/06/13 which was successful and remains in NSR but has runs of atrial fibrillation so still on amiodarone gtt. Remains on Lasix gtt 20 mg/hr along with milrinone 0.25. Good diuresis yesterday.  I checked CVP this morning, it was 11.   ECHO 3/31: EF 40-45%, RV mildly dilated and sys fx mildly reduced  Objective:   Weight Range:  Vital Signs:   Temp:  [97.8 F (36.6 C)-98.5 F (36.9 C)] 97.8 F (36.6 C) (04/05 0340) Pulse Rate:  [82-126] 83 (04/05 0400) Resp:  [20-31] 22 (04/05 0400) BP: (104-115)/(33-73) 112/34 mmHg (04/05 0400) SpO2:  [95 %-99 %] 99 % (04/05 0400) Weight:  [199 lb 1.2 oz (90.3 kg)] 199 lb 1.2 oz (90.3 kg) (04/05 0300) Last BM Date: 06/04/13  Weight change: Filed Weights   06/08/13 0359 06/09/13 0500 06/10/13 0300  Weight: 204 lb 12.8 oz (92.897 kg) 197 lb 1.5 oz (89.4 kg) 199 lb 1.2 oz (90.3 kg)    Intake/Output:   Intake/Output Summary (Last 24 hours) at 06/10/13 0739 Last data filed at 06/10/13 0700  Gross per 24 hour  Intake 1362.9 ml  Output   3404 ml  Net -2041.1 ml     Physical Exam: General: Chronically ill appearing. No resp difficulty  HEENT: normal  Neck: supple. JVP 9-10. Carotids 2+ bilat; no bruits. No lymphadenopathy or thryomegaly appreciated.  Cor: PMI nondisplaced. Tachy rate & regular rhythm. No rubs, gallops or murmurs.  Lungs: clear  Abdomen: soft, nontender, +distended. No hepatosplenomegaly. No bruits or masses. Good bowel sounds.  Extremities: no cyanosis, clubbing, rash, bilateral LE 2+ edema to thighs with erythema  Neuro: alert & orientedx3, cranial nerves grossly intact. moves all 4 extremities w/o difficulty. Affect pleasant  Telemetry: SR-ST 90-115s  Labs: Basic Metabolic Panel:  Recent Labs Lab 06/07/13 1919 06/08/13 0338 06/08/13 1540 06/09/13 0500 06/10/13 0500  NA 136* 133* 136* 136* 134*  K 3.4* 3.2* 3.5* 4.2 5.2  CL 91* 90* 91* 91* 89*  CO2 31 30 31  33* 33*  GLUCOSE 173* 114* 185* 112* 174*  BUN 34* 31* 29* 27* 26*  CREATININE 1.72* 1.71* 1.57* 1.55* 1.54*  CALCIUM 8.6 8.3* 8.5 8.6 8.7  MG  --   --  2.2  --   --     Liver Function Tests: No results found for this basename: AST, ALT, ALKPHOS, BILITOT, PROT, ALBUMIN,  in the last 168 hours No results found for this basename: LIPASE, AMYLASE,  in the last 168 hours No results found for this basename: AMMONIA,  in the last 168 hours  CBC:  Recent  Labs Lab 06/04/13 1248 06/06/13 0235 06/09/13 0500 06/10/13 0500  WBC 6.0 5.5 6.9 6.8  NEUTROABS  --  3.9  --   --   HGB 9.5* 9.0* 8.6* 8.5*  HCT 30.1* 28.2* 26.9* 26.5*  MCV 100.7* 99.6 96.8 97.8  PLT 239 220 248 251    Cardiac Enzymes: No results found for this basename: CKTOTAL, CKMB, CKMBINDEX, TROPONINI,  in the last 168 hours  BNP: BNP (last 3 results)  Recent Labs  05/25/13 0402 05/28/13 0528 06/04/13 1248  PROBNP 5322.0* 3515.0* 5763.0*    Imaging: No results found.   Medications:     Scheduled  Medications: . darifenacin  7.5 mg Oral Daily  . iron polysaccharides  150 mg Oral Daily  . metolazone  2.5 mg Oral Once  . potassium chloride  40 mEq Oral BID  . simvastatin  10 mg Oral q1800  . sodium chloride  10-40 mL Intracatheter Q12H  . sodium chloride  3 mL Intravenous Q12H  . sodium chloride  3 mL Intravenous Q12H  . spironolactone  25 mg Oral Daily  . vancomycin  1,000 mg Intravenous Q48H  . Warfarin - Pharmacist Dosing Inpatient   Does not apply q1800    Infusions: . sodium chloride    . amiodarone (NEXTERONE PREMIX) 360 mg/200 mL dextrose 30.06 mg/hr (06/10/13 0700)  . furosemide (LASIX) infusion 20 mg/hr (06/10/13 0700)  . 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 She is diuresing better now on milrinone/Lasix gtt + metolazone. Creatinine is stable. She now has a PICC.  I think she still needs more diuresis. CVP 11 this morning measured by me.  - Continue current Lasix gtt + milrinone and will give metolazone 2.5 mg x 1.  - Follow CVP by PICC.  - Co-ox has been good.  2) Atypical atrial flutter with RVR.  Went for TEE/DC-CV 4/1. She is in NSR 80s this am, but has periodic episodes of atrial flutter.  Continue amiodarone gtt while on milrinone.  4) CKD: Stable creatinine.  5) Cellulitis: LE erythema and warm to touch on arrival temp 101.5. Has been afebrile since admission. Will continue IV vancomycin per pharmacy.  Continue to work with PT and will need to go back to SNF at D/C  Length of Stay: [place] 06/10/2013, 7:39 AM  Advanced Heart Failure Team Pager [phone] (M-F; [place])  Please contact [facility] for night-coverage after hours (4p -7a ) and weekends on [email or web address]

## 2013-06-10 NOTE — Progress Notes (Signed)
Pharmacy Consult Note  Pharmacy Consult for Coumadin and Vancomycin Indication: atrial fibrillation + LE cellulitis   Allergies  Allergen Reactions  . Indomethacin Other (See Comments)    dizziness  . Norvasc [Amlodipine Besylate] Cough   Labs:  Recent Labs  06/08/13 0338 06/08/13 1540 06/09/13 0500 06/10/13 0500  HGB  --   --  8.6* 8.5*  HCT  --   --  26.9* 26.5*  PLT  --   --  248 251  LABPROT 31.0*  --  28.8* 24.8*  INR 3.12*  --  2.83* 2.33*  CREATININE 1.71* 1.57* 1.55* 1.54*    Estimated Creatinine Clearance: 34.5 ml/min (by C-G formula based on Cr of 1.54).   Assessment: 72yof admitted from SNF for weight gain, orthopnea, low grade fever, diarrhea.  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   She is anticoagulated with Coumadin INR now 2.33. Successful cardioversion 4/1. Patient does continue on IV amiodarone. No bleeding issues noted. CBC appears stable.  Per med rec home Coumadin dose 2mg  MWF, 3mg  all other days.    4/4 VT 28.8 on 1g q24h. Changed to 1 g q48h  Goal of Therapy:  INR 2-3 Monitor platelets by anticoagulation protocol: Yes Vancomycin trough 10-15   Plan:  Coumadin 1 mg x 1 tonight Daily INR Monitor s/s bleeding  Changed Vancomycin to 1g Q48h, next dose today (held yesterday's dose) Monitor renal function, clinical status  Evalee Gerard B. Leitha Schuller, PharmD Clinical Pharmacist - Resident Phone: [phone] Pager: [phone] 06/10/2013 9:27 AM

## 2013-06-11 LAB — BASIC METABOLIC PANEL
BUN: 24 mg/dL — ABNORMAL HIGH (ref 6–23)
CHLORIDE: 89 meq/L — AB (ref 96–112)
CO2: 35 mEq/L — ABNORMAL HIGH (ref 19–32)
Calcium: 8.7 mg/dL (ref 8.4–10.5)
Creatinine, Ser: 1.6 mg/dL — ABNORMAL HIGH (ref 0.50–1.10)
GFR, EST AFRICAN AMERICAN: 36 mL/min — AB (ref >90)
GFR, EST NON AFRICAN AMERICAN: 31 mL/min — AB (ref >90)
Glucose, Bld: 163 mg/dL — ABNORMAL HIGH (ref 70–99)
POTASSIUM: 4.7 meq/L (ref 3.7–5.3)
Sodium: 133 mEq/L — ABNORMAL LOW (ref 137–147)

## 2013-06-11 LAB — CBC
HCT: 27 % — ABNORMAL LOW (ref 36.0–46.0)
Hemoglobin: 8.5 g/dL — ABNORMAL LOW (ref 12.0–15.0)
MCH: 30.9 pg (ref 26.0–34.0)
MCHC: 31.5 g/dL (ref 30.0–36.0)
MCV: 98.2 fL (ref 78.0–100.0)
Platelets: 256 10*3/uL (ref 150–400)
RBC: 2.75 MIL/uL — AB (ref 3.87–5.11)
RDW: 16.1 % — AB (ref 11.5–15.5)
WBC: 7.1 10*3/uL (ref 4.0–10.5)

## 2013-06-11 LAB — PROTIME-INR
INR: 2.12 — ABNORMAL HIGH (ref 0.00–1.49)
PROTHROMBIN TIME: 23.1 s — AB (ref 11.6–15.2)

## 2013-06-11 MED ORDER — METOLAZONE 2.5 MG PO TABS
2.5000 mg | ORAL_TABLET | Freq: Once | ORAL | Status: DC
  Filled 2013-06-11: qty 1

## 2013-06-11 MED ORDER — WARFARIN SODIUM 3 MG PO TABS
3.0000 mg | ORAL_TABLET | Freq: Once | ORAL | Status: AC
  Administered 2013-06-11: 3 mg via ORAL
  Filled 2013-06-11: qty 1

## 2013-06-11 MED ORDER — METOLAZONE 5 MG PO TABS
5.0000 mg | ORAL_TABLET | Freq: Once | ORAL | Status: AC
  Administered 2013-06-11: 5 mg via ORAL
  Filled 2013-06-11: qty 1

## 2013-06-11 NOTE — Progress Notes (Signed)
Patient ID: MAYU RONK, female   DOB: 1940/04/06, 73 y.o.   MRN: [id number] Advanced Heart Failure Rounding Note Primary Physician: Dr. Quintin Alto  Primary Cardiologist: Dr. Harl Bowie  Reason for Admission: A/C HF  Subjective:    Ms. Powless is a 73 yo obese white female with known history of rheumatic fever during childhood and long standing history of chronic diastolic congestive heart failure. She also has a history of CKD, HTN and PAF. The patient has been admitted several times with recurrent exacerbations of CHF. Progressed to severe symptomatic Mitral Regurgitation with Paroxysmal Atrial Fibrillation and underwent minimally invasive maze procedure with MV replacement (05/16/13). Was discharged 05/31/13 to [facility] center and weight was 207 lbs.  Presented to the ED 3/30 after going to Dr. Guy Sandifer office for follow up. She had marked volume overload, diarrhea and rapid afib with RVR. Started on IV lasix, amiodarone gtt and vancomycin (cellulitis). TEE/DC-CV 06/06/13 which was successful and remains in NSR but has runs of atrial fibrillation so still on amiodarone gtt. Remains on Lasix gtt 20 mg/hr along with milrinone 0.25.  Weight down again.  I checked CVP this morning, it was 10.   ECHO 3/31: EF 40-45%, RV mildly dilated and sys fx mildly reduced  Objective:   Weight Range:  Vital Signs:   Temp:  [97.9 F (36.6 C)-98.7 F (37.1 C)] 98.1 F (36.7 C) (04/06 0329) Pulse Rate:  [84-88] 85 (04/06 0329) Resp:  [20-25] 22 (04/06 0329) BP: (93-119)/(32-48) 109/47 mmHg (04/06 0329) SpO2:  [92 %-97 %] 92 % (04/06 0329) Weight:  [194 lb 14.2 oz (88.4 kg)] 194 lb 14.2 oz (88.4 kg) (04/06 0536) Last BM Date: 06/10/13  Weight change: Filed Weights   06/09/13 0500 06/10/13 0300 06/11/13 0536  Weight: 197 lb 1.5 oz (89.4 kg) 199 lb 1.2 oz (90.3 kg) 194 lb 14.2 oz (88.4 kg)    Intake/Output:   Intake/Output Summary (Last 24 hours) at 06/11/13 0727 Last data filed at 06/11/13 0500  Gross per  24 hour  Intake 1383.9 ml  Output   2451 ml  Net -1067.1 ml     Physical Exam: General: Chronically ill appearing. No resp difficulty  HEENT: normal  Neck: supple. JVP 9-10. Carotids 2+ bilat; no bruits. No lymphadenopathy or thryomegaly appreciated.  Cor: PMI nondisplaced. Tachy rate & regular rhythm. No rubs, gallops or murmurs.  Lungs: clear  Abdomen: soft, nontender, +distended. No hepatosplenomegaly. No bruits or masses. Good bowel sounds.  Extremities: no cyanosis, clubbing, rash, bilateral LE 2+ edema to knees with erythema  Neuro: alert & orientedx3, cranial nerves grossly intact. moves all 4 extremities w/o difficulty. Affect pleasant  Telemetry: SR-ST 90-115s  Labs: Basic Metabolic Panel:  Recent Labs Lab 06/08/13 0338 06/08/13 1540 06/09/13 0500 06/10/13 0500 06/11/13 0510  NA 133* 136* 136* 134* 133*  K 3.2* 3.5* 4.2 5.2 4.7  CL 90* 91* 91* 89* 89*  CO2 30 31 33* 33* 35*  GLUCOSE 114* 185* 112* 174* 163*  BUN 31* 29* 27* 26* 24*  CREATININE 1.71* 1.57* 1.55* 1.54* 1.60*  CALCIUM 8.3* 8.5 8.6 8.7 8.7  MG  --  2.2  --   --   --     Liver Function Tests: No results found for this basename: AST, ALT, ALKPHOS, BILITOT, PROT, ALBUMIN,  in the last 168 hours No results found for this basename: LIPASE, AMYLASE,  in the last 168 hours No results found for this basename: AMMONIA,  in the last 168 hours  CBC:  Recent Labs Lab 06/04/13 1248 06/06/13 0235 06/09/13 0500 06/10/13 0500 06/11/13 0510  WBC 6.0 5.5 6.9 6.8 7.1  NEUTROABS  --  3.9  --   --   --   HGB 9.5* 9.0* 8.6* 8.5* 8.5*  HCT 30.1* 28.2* 26.9* 26.5* 27.0*  MCV 100.7* 99.6 96.8 97.8 98.2  PLT 239 220 248 251 256    Cardiac Enzymes: No results found for this basename: CKTOTAL, CKMB, CKMBINDEX, TROPONINI,  in the last 168 hours  BNP: BNP (last 3 results)  Recent Labs  05/25/13 0402 05/28/13 0528 06/04/13 1248  PROBNP 5322.0* 3515.0* 5763.0*    Imaging: No results  found.   Medications:     Scheduled Medications: . darifenacin  7.5 mg Oral Daily  . iron polysaccharides  150 mg Oral Daily  . metolazone  2.5 mg Oral Once  . potassium chloride  40 mEq Oral BID  . simvastatin  10 mg Oral q1800  . sodium chloride  10-40 mL Intracatheter Q12H  . sodium chloride  3 mL Intravenous Q12H  . sodium chloride  3 mL Intravenous Q12H  . spironolactone  25 mg Oral Daily  . vancomycin  1,000 mg Intravenous Q48H  . Warfarin - Pharmacist Dosing Inpatient   Does not apply q1800    Infusions: . sodium chloride    . amiodarone (NEXTERONE PREMIX) 360 mg/200 mL dextrose 30.06 mg/hr (06/11/13 0500)  . furosemide (LASIX) infusion 20 mg/hr (06/11/13 0500)  . 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 She is diuresing better now on milrinone/Lasix gtt + metolazone. Creatinine is stable. She now has a PICC.  I think she still needs more diuresis. CVP 10 this morning measured by me.  - Continue current Lasix gtt + milrinone and will give metolazone 5 mg x 1.  Hopefully will diurese well today and can transition to po tomorrow.  - Follow CVP by PICC.  - Co-ox has been good.  2) Atypical atrial flutter with RVR.  Went for TEE/DC-CV 4/1. She is in NSR 80s this am, but has periodic episodes of atrial flutter.  Continue amiodarone gtt while on milrinone.  4) CKD: Stable creatinine.  5) Cellulitis: LE erythema and warm to touch on arrival temp 101.5. Has been afebrile since admission. Will continue IV vancomycin per pharmacy.  Continue to work with PT and will need to go back to SNF at D/C, possibly Tuesday or Wednesday.   Length of Stay: [place] 06/11/2013, 7:27 AM  Advanced Heart Failure Team Pager  [phone] (M-F; 7a - 4p)  Please contact [facility] for night-coverage after hours (4p -7a ) and weekends on [email or web address]

## 2013-06-11 NOTE — Progress Notes (Signed)
Physical Therapy Treatment Patient Details Name: [name] [name] MRN: [id number] DOB: 01/26/1941 Today's Date: 06/11/2013    History of Present Illness Patient is a 73 year old obese widowed white female from [place] with remote history of rheumatic fever during childhood and long-standing history of chronic diastolic congestive heart failure who has recently experienced multiple recurrent episodes of acute exacerbation of chronic diastolic congestive heart failure.  She has been diagnosed with severe, symptomatic mitral regurgitation with recurrent paroxysmal atrial fibrillation S/P minimally invasive MVR. Pt DC to [facility] and returned to hospital  3/30 with bil LE edema and CHF    PT Comments    Patient continues to improve with ambulation.  Ambulated today on room air - O2 sats dropped to 87%.  O2 reapplied and sats increased to 92%.  RN notified.  Follow Up Recommendations  SNF     Equipment Recommendations  None recommended by PT    Recommendations for Other Services       Precautions / Restrictions Precautions Precautions: Fall Restrictions Weight Bearing Restrictions: No    Mobility  Bed Mobility                  Transfers Overall transfer level: Needs assistance Equipment used: Rolling walker (2 wheeled) Transfers: Sit to/from Stand Sit to Stand: Mod assist         General transfer comment: Patient able to scoot to edge of chair independently.  Assist to rise to standing and for balance.  Ambulation/Gait Ambulation/Gait assistance: Min guard Ambulation Distance (Feet): 100 Feet Assistive device: Rolling walker (2 wheeled) Gait Pattern/deviations: Step-through pattern;Decreased step length - right;Decreased step length - left;Decreased stride length Gait velocity: Decreased Gait velocity interpretation: Below normal speed for age/gender General Gait Details: Verbal cues to stand upright.  Patient ambulating on room air today - O2 sats decreased  to 87%.  Cues for patient to limit talking with ambulation to decrease dyspnea.   Stairs            Wheelchair Mobility    Modified Rankin (Stroke Patients Only)       Balance                                    Cognition Arousal/Alertness: Awake/alert Behavior During Therapy: WFL for tasks assessed/performed Overall Cognitive Status: Within Functional Limits for tasks assessed                      Exercises      General Comments        Pertinent Vitals/Pain     Home Living                      Prior Function            PT Goals (current goals can now be found in the care plan section) Progress towards PT goals: Progressing toward goals    Frequency  Min 2X/week    PT Plan Current plan remains appropriate    Co-evaluation             End of Session Equipment Utilized During Treatment: Gait belt;Oxygen Activity Tolerance: Patient limited by fatigue;Treatment limited secondary to medical complications (Comment) (Decreased O2 sats on room air with ambulation) Patient left: in chair;with call bell/phone within reach     Time: 1025-1057 PT Time Calculation (min): 32 min  Charges:  $Gait Training: 23-37  mins                    G Codes:      Despina Pole 2013-07-08, 12:28 PM Carita Pian. Sanjuana Kava, [place] Pager [phone]

## 2013-06-11 NOTE — Progress Notes (Signed)
Pharmacy Consult Note  Pharmacy Consult for Coumadin and Vancomycin Indication: atrial fibrillation + LE cellulitis   Allergies  Allergen Reactions  . Indomethacin Other (See Comments)    dizziness  . Norvasc [Amlodipine Besylate] Cough   Labs:  Recent Labs  06/09/13 0500 06/10/13 0500 06/11/13 0510  HGB 8.6* 8.5* 8.5*  HCT 26.9* 26.5* 27.0*  PLT 248 251 256  LABPROT 28.8* 24.8* 23.1*  INR 2.83* 2.33* 2.12*  CREATININE 1.55* 1.54* 1.60*    Estimated Creatinine Clearance: 32.8 ml/min (by C-G formula based on Cr of 1.6).   Assessment: 72yof admitted from SNF for weight gain, orthopnea, low grade fever, diarrhea.  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   She is anticoagulated with Coumadin INR now 2.12 falling after skipped dose last week for INR > 3. Successful cardioversion 4/1. Patient does continue on IV amiodarone. No bleeding issues noted. CBC appears stable.  Per med rec home Coumadin dose 2mg  MWF, 3mg  all other days.    4/4 VT 28.8 on 1g q24h. Changed to 1 g q48h  Goal of Therapy:  INR 2-3 Monitor platelets by anticoagulation protocol: Yes Vancomycin trough 10-15   Plan:  Coumadin 3 mg x 1 tonight Daily INR Monitor s/s bleeding  Changed Vancomycin to 1g Q48h,  Monitor renal function, clinical status  Bonnita Nasuti Pharm.D. CPP, BCPS Clinical Pharmacist [phone] 06/11/2013 11:27 AM

## 2013-06-11 NOTE — Progress Notes (Addendum)
Noted potential for discharge Tuesday or Wednesday, per MD note. Updated [facility] SNF.  Addendum: Pt expressed being eager to discharge from the hospital. Assured her that [facility] is aware discharge imminent and that pt could leave tomorrow. IV meds must be switched to PO for facility to take pt.  Ky Barban, MSW, [facility] Clinical Social Worker [phone]

## 2013-06-12 ENCOUNTER — Other Ambulatory Visit: Payer: Self-pay

## 2013-06-12 LAB — COMPREHENSIVE METABOLIC PANEL
ALBUMIN: 2.3 g/dL — AB (ref 3.5–5.2)
ALK PHOS: 101 U/L (ref 39–117)
ALT: 14 U/L (ref 0–35)
AST: 22 U/L (ref 0–37)
BUN: 28 mg/dL — ABNORMAL HIGH (ref 6–23)
CALCIUM: 9.2 mg/dL (ref 8.4–10.5)
CO2: 35 mEq/L — ABNORMAL HIGH (ref 19–32)
Chloride: 88 mEq/L — ABNORMAL LOW (ref 96–112)
Creatinine, Ser: 1.81 mg/dL — ABNORMAL HIGH (ref 0.50–1.10)
GFR calc Af Amer: 31 mL/min — ABNORMAL LOW (ref >90)
GFR calc non Af Amer: 27 mL/min — ABNORMAL LOW (ref >90)
Glucose, Bld: 121 mg/dL — ABNORMAL HIGH (ref 70–99)
POTASSIUM: 4.6 meq/L (ref 3.7–5.3)
SODIUM: 136 meq/L — AB (ref 137–147)
Total Bilirubin: 0.3 mg/dL (ref 0.3–1.2)
Total Protein: 6 g/dL (ref 6.0–8.3)

## 2013-06-12 LAB — PROTIME-INR
INR: 1.82 — ABNORMAL HIGH (ref 0.00–1.49)
PROTHROMBIN TIME: 20.5 s — AB (ref 11.6–15.2)

## 2013-06-12 MED ORDER — POTASSIUM CHLORIDE CRYS ER 20 MEQ PO TBCR
40.0000 meq | EXTENDED_RELEASE_TABLET | Freq: Every day | ORAL | Status: DC
  Administered 2013-06-12 – 2013-06-13 (×2): 40 meq via ORAL
  Filled 2013-06-12: qty 2

## 2013-06-12 MED ORDER — SODIUM CHLORIDE 0.9 % IV SOLN: 250.0000 mL | INTRAVENOUS | Status: DC | PRN

## 2013-06-12 MED ORDER — VANCOMYCIN HCL IN DEXTROSE 1-5 GM/200ML-% IV SOLN
1000.0000 mg | INTRAVENOUS | Status: AC
  Administered 2013-06-12: 1000 mg via INTRAVENOUS
  Filled 2013-06-12: qty 200

## 2013-06-12 MED ORDER — TORSEMIDE 20 MG PO TABS
80.0000 mg | ORAL_TABLET | Freq: Every day | ORAL | Status: DC
  Administered 2013-06-12 – 2013-06-13 (×2): 80 mg via ORAL
  Filled 2013-06-12 (×2): qty 4

## 2013-06-12 MED ORDER — AMIODARONE HCL 200 MG PO TABS
200.0000 mg | ORAL_TABLET | Freq: Two times a day (BID) | ORAL | Status: DC
  Administered 2013-06-12 – 2013-06-13 (×3): 200 mg via ORAL
  Filled 2013-06-12 (×4): qty 1

## 2013-06-12 MED ORDER — WARFARIN SODIUM 5 MG PO TABS
5.0000 mg | ORAL_TABLET | Freq: Once | ORAL | Status: AC
  Administered 2013-06-12: 5 mg via ORAL
  Filled 2013-06-12 (×2): qty 1

## 2013-06-12 NOTE — Progress Notes (Signed)
Pharmacy Consult Note  Pharmacy Consult for Coumadin and Vancomycin Indication: atrial fibrillation + LE cellulitis   Allergies  Allergen Reactions  . Indomethacin Other (See Comments)    dizziness  . Norvasc [Amlodipine Besylate] Cough   Labs:  Recent Labs  06/10/13 0500 06/11/13 0510 06/12/13 0400  HGB 8.5* 8.5*  --   HCT 26.5* 27.0*  --   PLT 251 256  --   LABPROT 24.8* 23.1* 20.5*  INR 2.33* 2.12* 1.82*  CREATININE 1.54* 1.60* 1.81*    Estimated Creatinine Clearance: 28.7 ml/min (by C-G formula based on Cr of 1.81).   Assessment: 72yof admitted from SNF for weight gain, orthopnea, low grade fever, diarrhea.  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   She is anticoagulated with Coumadin INR now 1.85 falling after skipped dose last week for INR > 3. Successful cardioversion 4/1. Patient does continue on IV amiodarone. No bleeding issues noted. CBC appears stable.  Per med rec home Coumadin dose 2mg  MWF, 3mg  all other days.    4/4 VT 28.8 on 1g q24h. Changed to 1 g q48h - stop after dose today  Goal of Therapy:  INR 2-3 Monitor platelets by anticoagulation protocol: Yes Vancomycin trough 10-15   Plan:  Coumadin 5 mg x 1 tonight Daily INR Monitor s/s bleeding   Vancomycin to 1g Q48h,  - last dose today Monitor renal function, clinical status  Bonnita Nasuti Pharm.D. CPP, BCPS Clinical Pharmacist [phone] 06/12/2013 9:53 AM

## 2013-06-12 NOTE — Progress Notes (Signed)
[name], female   DOB: 04-Jan-1941, 73 y.o.   MRN: [id number] Advanced Heart Failure Rounding Note Primary Physician: Dr. Quintin Alto  Primary Cardiologist: Dr. Harl Bowie  Reason for Admission: A/C HF  Subjective:    [name] [name] is a 73 yo obese white female with known history of rheumatic fever during childhood and long standing history of chronic diastolic congestive heart failure. She also has a history of CKD, HTN and PAF. The patient has been admitted several times with recurrent exacerbations of CHF. Progressed to severe symptomatic Mitral Regurgitation with Paroxysmal Atrial Fibrillation and underwent minimally invasive maze procedure with MV replacement (05/16/13). Was discharged 05/31/13 to [facility] center and weight was 207 lbs.  Presented to the ED 3/30 after going to Dr. Guy Sandifer office for follow up. She had marked volume overload, diarrhea and rapid afib with RVR. Started on IV lasix, amiodarone gtt and vancomycin (cellulitis). TEE/DC-CV 06/06/13 which was successful and remains in NSR but has runs of atrial fibrillation.  CVP down to 4, weight down 4 lbs.  Leg edema still present but improved.    ECHO 3/31: EF 40-45%, RV mildly dilated and sys fx mildly reduced  Objective:   Weight Range:  Vital Signs:   Temp:  [97.8 F (36.6 C)-98.2 F (36.8 C)] 97.8 F (36.6 C) (04/07 0400) Pulse Rate:  [73-116] 85 (04/07 0700) Resp:  [15-31] 31 (04/07 0700) BP: (100-124)/(31-48) 100/35 mmHg (04/07 0400) SpO2:  [87 %-98 %] 98 % (04/07 0700) Weight:  [190 lb 14.7 oz (86.6 kg)] 190 lb 14.7 oz (86.6 kg) (04/07 0231) Last BM Date: 06/10/13  Weight change: Filed Weights   06/10/13 0300 06/11/13 0536 06/12/13 0231  Weight: 199 lb 1.2 oz (90.3 kg) 194 lb 14.2 oz (88.4 kg) 190 lb 14.7 oz (86.6 kg)    Intake/Output:   Intake/Output Summary (Last 24 hours) at 06/12/13 0809 Last data filed at 06/12/13 0700  Gross per 24 hour  Intake 2294.4 ml  Output   2450 ml  Net -155.6 ml      Physical Exam: General: Chronically ill appearing. No resp difficulty  HEENT: normal  Neck: supple. JVP 8. Carotids 2+ bilat; no bruits. No lymphadenopathy or thryomegaly appreciated.  Cor: PMI nondisplaced. Tachy rate & regular rhythm. No rubs, gallops or murmurs.  Lungs: clear  Abdomen: soft, nontender, +distended. No hepatosplenomegaly. No bruits or masses. Good bowel sounds.  Extremities: no cyanosis, clubbing, rash, bilateral LE 1+ edema 1/2 to knees with erythema  Neuro: alert & orientedx3, cranial nerves grossly intact. moves all 4 extremities w/o difficulty. Affect pleasant  Telemetry: SR-ST 90-115s  Labs: Basic Metabolic Panel:  Recent Labs Lab 06/08/13 1540 06/09/13 0500 06/10/13 0500 06/11/13 0510 06/12/13 0400  NA 136* 136* 134* 133* 136*  K 3.5* 4.2 5.2 4.7 4.6  CL 91* 91* 89* 89* 88*  CO2 31 33* 33* 35* 35*  GLUCOSE 185* 112* 174* 163* 121*  BUN 29* 27* 26* 24* 28*  CREATININE 1.57* 1.55* 1.54* 1.60* 1.81*  CALCIUM 8.5 8.6 8.7 8.7 9.2  MG 2.2  --   --   --   --     Liver Function Tests:  Recent Labs Lab 06/12/13 0400  AST 22  ALT 14  ALKPHOS 101  BILITOT 0.3  PROT 6.0  ALBUMIN 2.3*   No results found for this basename: LIPASE, AMYLASE,  in the last 168 hours No results found for this basename: AMMONIA,  in the last 168 hours  CBC:  Recent  Labs Lab 06/06/13 0235 06/09/13 0500 06/10/13 0500 06/11/13 0510  WBC 5.5 6.9 6.8 7.1  NEUTROABS 3.9  --   --   --   HGB 9.0* 8.6* 8.5* 8.5*  HCT 28.2* 26.9* 26.5* 27.0*  MCV 99.6 96.8 97.8 98.2  PLT 220 248 251 256    Cardiac Enzymes: No results found for this basename: CKTOTAL, CKMB, CKMBINDEX, TROPONINI,  in the last 168 hours  BNP: BNP (last 3 results)  Recent Labs  05/25/13 0402 05/28/13 0528 06/04/13 1248  PROBNP 5322.0* 3515.0* 5763.0*    Imaging: No results found.   Medications:     Scheduled Medications: . amiodarone  200 mg Oral BID  . darifenacin  7.5 mg Oral Daily   . iron polysaccharides  150 mg Oral Daily  . potassium chloride  40 mEq Oral BID  . simvastatin  10 mg Oral q1800  . sodium chloride  10-40 mL Intracatheter Q12H  . sodium chloride  3 mL Intravenous Q12H  . sodium chloride  3 mL Intravenous Q12H  . spironolactone  25 mg Oral Daily  . torsemide  80 mg Oral Daily  . vancomycin  1,000 mg Intravenous Q48H  . Warfarin - Pharmacist Dosing Inpatient   Does not apply q1800    Infusions: . 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 Weight is down about 20 lbs with milrinone/Lasix gtt + metolazone. Creatinine slightly higher today.  CVP down to 4 today.  - Stop Lasix gtt and milrinone gtt today.  - Start torsemide 80 mg daily.  2) Atypical atrial flutter with RVR.  Went for TEE/DC-CV 4/1. She is in NSR 80s this am, but has had periodic episodes of atrial flutter.  Transition to po amiodarone.   4) CKD: Creatinine slightly up to 1.8.  5) Cellulitis: LE erythema and warm to touch on arrival temp 101.5. Has been afebrile since admission. One more dose IV vancomycin.   Continue to work with PT and will need to go back to SNF at D/C probably Wednesday (tomorrow).   Length of Stay: [place] 06/12/2013, 8:09 AM  Advanced Heart Failure Team Pager [phone] (M-F; 7a - 4p)  Please contact [facility] for night-coverage after hours (4p -7a ) and weekends on [email or web address]

## 2013-06-12 NOTE — Progress Notes (Signed)
Pt expressed being disappointed she is not discharging today--pt states she is switching to PO meds and the MD wants to watch her for one more day before discharging tomorrow. Engaged pt in brief CBT, focusing on preventing future hospitalization and positive thinking. Pt thanked CSW for support. Pt wondering if she can bring her own power chair to SNF, and CSW left voicemail with [facility] asking this and requesting facility follow up with 3E CSW to answer this and any other questions pt has. Pt transferring to 3E07. Gave unit CSW a handoff. I am signing off.   Ky Barban, MSW, [facility] Clinical Social Worker [phone]

## 2013-06-13 ENCOUNTER — Inpatient Hospital Stay
Admission: RE | Admit: 2013-06-13 | Discharge: 2013-06-29 | Disposition: A | Discharge status: Home / Self Care | Payer: Medicare HMO | Source: Ambulatory Visit | Attending: Internal Medicine | Admitting: Internal Medicine

## 2013-06-13 LAB — BASIC METABOLIC PANEL
BUN: 26 mg/dL — AB (ref 6–23)
CHLORIDE: 92 meq/L — AB (ref 96–112)
CO2: 35 mEq/L — ABNORMAL HIGH (ref 19–32)
Calcium: 9 mg/dL (ref 8.4–10.5)
Creatinine, Ser: 1.69 mg/dL — ABNORMAL HIGH (ref 0.50–1.10)
GFR calc Af Amer: 34 mL/min — ABNORMAL LOW (ref >90)
GFR, EST NON AFRICAN AMERICAN: 29 mL/min — AB (ref >90)
Glucose, Bld: 85 mg/dL (ref 70–99)
POTASSIUM: 4.5 meq/L (ref 3.7–5.3)
Sodium: 137 mEq/L (ref 137–147)

## 2013-06-13 LAB — CBC
HEMATOCRIT: 27.8 % — AB (ref 36.0–46.0)
HEMOGLOBIN: 8.8 g/dL — AB (ref 12.0–15.0)
MCH: 31.2 pg (ref 26.0–34.0)
MCHC: 31.7 g/dL (ref 30.0–36.0)
MCV: 98.6 fL (ref 78.0–100.0)
Platelets: 223 10*3/uL (ref 150–400)
RBC: 2.82 MIL/uL — AB (ref 3.87–5.11)
RDW: 15.8 % — ABNORMAL HIGH (ref 11.5–15.5)
WBC: 5.2 10*3/uL (ref 4.0–10.5)

## 2013-06-13 LAB — PROTIME-INR
INR: 1.9 — ABNORMAL HIGH (ref 0.00–1.49)
Prothrombin Time: 21.2 seconds — ABNORMAL HIGH (ref 11.6–15.2)

## 2013-06-13 MED ORDER — WARFARIN SODIUM 2 MG PO TABS: ORAL_TABLET | ORAL | Status: DC

## 2013-06-13 MED ORDER — AMIODARONE HCL 200 MG PO TABS: 200.0000 mg | ORAL_TABLET | Freq: Two times a day (BID) | ORAL | Status: DC

## 2013-06-13 MED ORDER — BENZONATATE 100 MG PO CAPS: 100.0000 mg | ORAL_CAPSULE | Freq: Two times a day (BID) | ORAL | Status: DC | PRN

## 2013-06-13 MED ORDER — TRAMADOL HCL 50 MG PO TABS
50.0000 mg | ORAL_TABLET | Freq: Two times a day (BID) | ORAL | Status: DC | PRN
Start: 2013-06-13 — End: 2013-06-14

## 2013-06-13 MED ORDER — WARFARIN SODIUM 5 MG PO TABS
5.0000 mg | ORAL_TABLET | Freq: Once | ORAL | Status: DC
  Filled 2013-06-13: qty 1

## 2013-06-13 MED ORDER — SIMVASTATIN 10 MG PO TABS: 10.0000 mg | ORAL_TABLET | Freq: Every day | ORAL | Status: DC

## 2013-06-13 MED ORDER — TORSEMIDE 20 MG PO TABS: 80.0000 mg | ORAL_TABLET | Freq: Every day | ORAL | Status: DC

## 2013-06-13 NOTE — Progress Notes (Signed)
Pharmacy Consult Note  Pharmacy Consult for Coumadin and Vancomycin Indication: atrial fibrillation + LE cellulitis   Allergies  Allergen Reactions  . Indomethacin Other (See Comments)    dizziness  . Norvasc [Amlodipine Besylate] Cough   Labs:  Recent Labs  06/11/13 0510 06/12/13 0400 06/13/13 0540  HGB 8.5*  --  8.8*  HCT 27.0*  --  27.8*  PLT 256  --  223  LABPROT 23.1* 20.5* 21.2*  INR 2.12* 1.82* 1.90*  CREATININE 1.60* 1.81* 1.69*    Estimated Creatinine Clearance: 30.7 ml/min (by C-G formula based on Cr of 1.69).   Assessment: 72yof admitted from SNF for weight gain, orthopnea, low grade fever, diarrhea.  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   She is anticoagulated with Coumadin INR now 1.9 up slightly today. Successful cardioversion 4/1. Patient does continue on po amiodarone now. No bleeding issues noted. CBC appears stable.  Per med rec home Coumadin dose 2mg  MWF, 3mg  all other days.    Vancomycin was stopped yesterday  Goal of Therapy:  INR 2-3 Monitor platelets by anticoagulation protocol: Yes    Plan:  Coumadin 5 mg x 1 tonight Monitor s/s bleeding  Follow up inr as outpatient  Erin Hearing PharmD., BCPS Clinical Pharmacist Pager [phone] 06/13/2013 8:21 AM

## 2013-06-13 NOTE — Progress Notes (Signed)
Patient had c/o left shoulder pain unrelieved by PRN Tylenol.  PRN Ultram administered as ordered. Patient is currently pain free and resting comfortably. Will continue to monitor. [name] [name]

## 2013-06-13 NOTE — Progress Notes (Signed)
Notified by CMT at change of shift that patient had converted back A-Fib.  Stat EKG done to confirm rhythm change.  EKG showed patient to be in sinus rhythm with a first degree heart block.  EKG strip printed and placed in chart. Patient is currently in sinus rhythm. Will continue to monitor. Tresa Endo

## 2013-06-13 NOTE — Discharge Summary (Signed)
CARDIOLOGY DISCHARGE SUMMARY   [name] MRN: [id number] DOB/AGE: 73-Jan-1942 73 y.o.  Admit date: 06/04/2013 Discharge date: 06/13/2013  PCP: [name], MD Primary Cardiologist: Dr. Harl Bowie  Primary Discharge Diagnosis:  Acute on chronic diastolic heart failure - weight 191 pounds at discharge Secondary Discharge Diagnosis:  Atrial fibrillation, rapid ventricular response Atypical atrial flutter, RVR - status post TEE/DCCV 4/1, still with episodes of a. flutter  ANTICOAGULATION WITH cOUMADIN Chronic kidney disease stage III Lower extremity cellulitis History of severe MR, status post [facility] Mitral bovine bioprosthetic tissue valve (size 13mm, model  #7300TFX, serial #[id number]) on 05/16/2013 with Maze procedure Anemia Deconditioning  Consults: pharmacy, TCTS, social work, heart failure team, physical therapy  Procedures: TEE, cardioversion, 2-D echocardiogram  Hospital Course: [name] [name] is a 73 y.o. female with a history of severe MR and recent mitral valve replacement. She was discharged to [facility] in [place] on 3/25. She was seen by Dr. Patrice Paradise on 3/30 and complaining of orthopnea, lower extremity edema, diarrhea and low-grade fever. She was seen in consult by the heart failure team and admitted for further evaluation and treatment.  1. Acute on chronic diastolic CHF: She was initially diuresed with Lasix 80 mg IV 3 times a day. Her creatinine increased and this was followed closely. Metolazone was added to help with diuresis. She was started on a Lasix drip because she remained remarkably volume overloaded. Her cardiac output remains low and she was started on a milrinone drip as well. Lasix drip was up to titrated to 20 mg per hour. A PICC line was placed because of the drips and CVPs were monitored. As her condition improved, the medications were changed. By discharge, her CVP was down to 4. Her creatinine was stable. She's to be on  torsemide and spironolactone at discharge.  2. Atypical atrial flutter: She was in rapid atrial fibrillation on admission. She also had a typical atrial flutter.She was started on IV amiodarone for rate control. She was continued on IV amiodarone. The decision was made to pursue TEE cardioversion as it was felt that she would have a much harder time controlling her heart failure if she were not in sinus rhythm. A TEE cardioversion was scheduled. She had a TEE cardioversion on 4/1. Results below. The TEE she was successfully cardioverted into sinus rhythm. She is generally maintaining sinus rhythm at discharge was still having runs of atrial fibrillation/flutter. She is to remain on oral amiodarone for now.  3. Anticoagulation: She was started on Coumadin prior to admission. Her INR was therapeutic on admission. Pharmacy was consulted to manage this. Her INR decreased slightly to 1.82. Currently her INR is 1.9. She will get it checked on Friday at the facility, with [facility] cardiology to manage.  4. Lower extremity cellulitis: She was running a low-grade fever and had both edema and erythema in her lower extremities. MAXIMUM TEMPERATURE was 101.5. She was started on antibiotics and completed therapy prior to discharge.  5. Chronic kidney disease stage III: At discharge after her surgery, her BUN was 15 with creatinine 1.48. On admission her BUN was 33 with creatinine 1.68. Her peak was on 3/31 with a BUN of 38 and a creatinine 1.8. Discharge labs are below.  6. Severe MR, S./P. Bioprosthetic MVR - Initially a 2-D echocardiogram was obtained, results below. During the TEE, the valve was also carefully assessed and no problems were noted. Dr. Roxy Manns followed up on the results and will see  her as an outpatient.  7. Anemia: She had anemia postoperatively as expected. Her CBCs were followed during her hospital stay as part of the anticoagulation protocol. Her hemoglobin and hematocrit were low but stable and her  MCV is within normal limits. She is to continue on oral iron supplementation. She is to follow up as an outpatient.  8. Deconditioning: At her previous discharge, she had gone to [facility] in [place]. A social work consult was called and she was also seen by physical therapy. The physical therapy evaluation showed significant deficits and patient is to get physical therapy as an outpatient to maximize mobility, function and strength. The decision was made for her to go to the [facility] instead of back to [facility].  On 4/8, [name] [name] was seen by Dr. Aundra Dubin. All data reviewed. He felt no further inpatient workup was indicated and she is considered stable for discharge to the [facility], to follow up as an outpatient.  Labs:   Lab Results  Component Value Date   WBC 5.2 06/13/2013   HGB 8.8* 06/13/2013   HCT 27.8* 06/13/2013   MCV 98.6 06/13/2013   PLT 223 06/13/2013     Recent Labs Lab 06/12/13 0400 06/13/13 0540  NA 136* 137  K 4.6 4.5  CL 88* 92*  CO2 35* 35*  BUN 28* 26*  CREATININE 1.81* 1.69*  CALCIUM 9.2 9.0  PROT 6.0  --   BILITOT 0.3  --   ALKPHOS 101  --   ALT 14  --   AST 22  --   GLUCOSE 121* 85    Pro B Natriuretic peptide (BNP)  Date/Time Value Ref Range Status  06/04/2013 12:48 PM 5763.0* 0 - 125 pg/mL Final  05/28/2013  5:28 AM 3515.0* 0 - 125 pg/mL Final    Recent Labs  06/13/13 0540  INR 1.90*      Radiology: Dg Chest 1 View 06/04/2013   CLINICAL DATA:  Shortness of breath. Status post mitral valve replacement and Maze procedure on 05/16/2013.  EXAM: CHEST - 1 VIEW  COMPARISON:  DG CHEST 2 VIEW dated 05/26/2013; DG CHEST 2 VIEW dated 05/23/2013; DG CHEST 1V PORT dated 05/22/2013; DG CHEST 2 VIEW dated 03/20/2013; CT ANGIO CHEST AORTA W/CM & WO/CM dated 05/07/2013; DG CHEST 1V PORT dated 05/17/2013  FINDINGS: There is stable cardiomegaly. Mitral valve replacement visible and stable in appearance lungs show some chronic opacity in the  right lower lung zone which has essentially been stable following cardiac surgery. No overt edema is identified. There is no evidence of significant pleural fluid or pneumothorax.  IMPRESSION: Stable cardiomegaly and postoperative right lower lung opacity which is likely a combination of atelectasis and scarring.   Electronically Signed   By: Aletta Edouard M.D.   On: 06/04/2013 11:20   Dg Chest 2 View 06/05/2013   CLINICAL DATA:  Short of breath.  Weakness.  EXAM: CHEST  2 VIEW  COMPARISON:  DG CHEST 1 VIEW dated 06/04/2013; DG CHEST 2 VIEW dated 05/26/2013; DG CHEST 2 VIEW dated 05/23/2013; DG CHEST 2 VIEW dated 03/20/2013  FINDINGS: Cardiomegaly is present, stable. Bioprosthetic mitral valve replacement. Opacity is present at the right lung base, slightly improved compared to the recent prior examination yesterday. This may represent improving asymmetric pulmonary edema with superimposed airspace disease/pneumonia. Small pleural effusion is present with blunting of the right costophrenic angle. Monitoring leads project over the chest.  IMPRESSION: Mild improvement in airspace disease at the right  lung base. Small right pleural effusion.   Electronically Signed   By: Dereck Ligas M.D.   On: 06/05/2013 08:26    TEE:06/06/2013 Conclusions - Left ventricle: The cavity size was normal. Wall thickness was increased in a pattern of mild LVH. The estimated ejection fraction was 45%. Diffuse hypokinesis. - Aortic valve: Bicuspid. There was no stenosis. Mild to moderate regurgitation. - Mitral valve: Bioprosthetic mitral valve with trivial regurgitation and no significant stenosis. Pressure half-time: 76ms. - Left atrium: Ligated at prior surgery but there was still some flow into the appendage. No thrombus noted in appendage. The atrium was mildly dilated. - Right ventricle: The cavity size was normal. Systolic function was mildly reduced. - Right atrium: No evidence of thrombus in the atrial  cavity or appendage. - Tricuspid valve: Moderate regurgitation. Peak RV-RA gradient: 71mm Hg (S). Impressions: - May proceed to DCCV.  EKG: 06/12/2013 Sinus rhythm with 1st degree A-V block Incomplete left bundle branch block Moderate voltage criteria for LVH, may be normal variant Prolonged QT Vent. rate 78 BPM PR interval 272 ms QRS duration 106 ms QT/QTc 422/481 ms P-R-T axes * -26 -37  2 D Echo: 06/05/2013 conclusions - Procedure narrative: Transthoracic echocardiography. Poor endocardial definition. Intravenous contrast (Definity) was administered. - Left ventricle: The cavity size was normal. Wall thickness was normal. Systolic function was mildly to moderately reduced. The estimated ejection fraction was in the range of 40% to 45%. LV diastolic function cannot be assessed due to the prosthetic mitral valve. - Aortic valve: Poorly visualized. Mildly calcified leaflets. Transvalvular velocity was minimally increased. Mild regurgitation. - Mitral valve: Bioprosthetic mitral valve. Leaflets not well-visualized. Appears to be stable. Peak and mean gradients of 14 and 6 mmHg across the valve. Trivial regurgitation. Valve area by continuity equation (using LVOT flow): 1.33cm^2. - Right ventricle: The cavity size was mildly dilated. Systolic function is mildly reduced. - Right atrium: The atrium was normal in size. - Tricuspid valve: Mild regurgitation. - Pulmonary arteries: PA peak pressure: 28mm Hg (S). - Inferior vena cava: The vessel was dilated; the respirophasic diameter changes were blunted (< 50%); findings are consistent with elevated central venous pressure. - Pericardium, extracardiac: There was no pericardial effusion.  FOLLOW UP PLANS AND APPOINTMENTS Allergies  Allergen Reactions  . Indomethacin Other (See Comments)    dizziness  . Norvasc [Amlodipine Besylate] Cough     Medication List    STOP taking these medications       furosemide 40 MG  tablet  Commonly known as:  LASIX     pravastatin 20 MG tablet  Commonly known as:  PRAVACHOL  Replaced by:  simvastatin 10 MG tablet      TAKE these medications       acetaminophen 325 MG tablet  Commonly known as:  TYLENOL  Take 650 mg by mouth every 6 (six) hours as needed for moderate pain.     amiodarone 200 MG tablet  Commonly known as:  PACERONE  Take 1 tablet (200 mg total) by mouth 2 (two) times daily.     aspirin EC 81 MG tablet  Take 81 mg by mouth daily.     benzonatate 100 MG capsule  Commonly known as:  TESSALON  Take 1 capsule (100 mg total) by mouth 2 (two) times daily as needed for cough.     calcium-vitamin D 500-200 MG-UNIT per tablet  Commonly known as:  OSCAL WITH D  Take 1 tablet by mouth daily.     guaiFENesin  600 MG 12 hr tablet  Commonly known as:  MUCINEX  Take 1 tablet (600 mg total) by mouth every 12 (twelve) hours as needed for cough.     IMODIUM PO  Take 1 tablet by mouth daily as needed (for loose stools).     iron polysaccharides 150 MG capsule  Commonly known as:  NIFEREX  Take 1 capsule (150 mg total) by mouth daily. For one month then stop.     metoprolol tartrate 25 MG tablet  Commonly known as:  LOPRESSOR  Take 0.5 tablets (12.5 mg total) by mouth 2 (two) times daily.     oxybutynin 5 MG 24 hr tablet  Commonly known as:  DITROPAN-XL  Take 5 mg by mouth at bedtime.     potassium chloride SA 20 MEQ tablet  Commonly known as:  K-DUR,KLOR-CON  Take 1 tablet (20 mEq total) by mouth daily.     simvastatin 10 MG tablet  Commonly known as:  ZOCOR  Take 1 tablet (10 mg total) by mouth daily at 6 PM.     spironolactone 25 MG tablet  Commonly known as:  ALDACTONE  Take 1 tablet (25 mg total) by mouth daily.     torsemide 20 MG tablet  Commonly known as:  DEMADEX  Take 4 tablets (80 mg total) by mouth daily.     traMADol 50 MG tablet  Commonly known as:  ULTRAM  Take 1 tablet (50 mg total) by mouth every 12 (twelve) hours as  needed for moderate pain.     warfarin 2 MG tablet  Commonly known as:  COUMADIN  2 MG Monday, Wednesday, and Friday and 3 MG all other days.        Discharge Orders   Future Orders Complete By Expires   ([place]) Call MD:  Anytime you have any of the following symptoms: 1) 3 pound weight gain in 24 hours or 5 pounds in 1 week 2) shortness of breath, with or without a dry hacking cough 3) swelling in the hands, feet or stomach 4) if you have to sleep on extra pillows at night in order to breathe.  As directed    Diet - low sodium heart healthy  As directed    Increase activity slowly  As directed      Follow-up Information   Follow up with [name], MD. (The office will call)    Specialty:  Cardiology   Contact information:   [address]  [place] [address] [phone]       Follow up with [name], MD. (As scheduled)    Specialty:  Cardiology   Contact information:   [address] [place] [address] [phone]       Follow up with [facility] [place]. (INR check on Friday, 4/10, performed by [facility])    Contact information:   [address] [place] [address]       BRING ALL MEDICATIONS WITH YOU TO FOLLOW UP APPOINTMENTS  Time spent with patient to include physician time: 44 min Signed: Lonn Georgia, PA-C 06/13/2013, 10:07 AM Co-Sign MD

## 2013-06-13 NOTE — Progress Notes (Signed)
[name], female   DOB: 1940/09/29, 73 y.o.   MRN: [id number] Advanced Heart Failure Rounding Note Primary Physician: Dr. Quintin Alto  Primary Cardiologist: Dr. Harl Bowie  Reason for Admission: A/C HF  Subjective:    [name] [name] is a 73 yo obese white female with known history of rheumatic fever during childhood and long standing history of chronic diastolic congestive heart failure. She also has a history of CKD, HTN and PAF. The patient has been admitted several times with recurrent exacerbations of CHF. Progressed to severe symptomatic Mitral Regurgitation with Paroxysmal Atrial Fibrillation and underwent minimally invasive maze procedure with MV replacement (05/16/13). Was discharged 05/31/13 to [facility] center and weight was 207 lbs.  Presented to the ED 3/30 after going to Dr. Guy Sandifer office for follow up. She had marked volume overload, diarrhea and rapid afib with RVR. Started on IV lasix, amiodarone gtt and vancomycin (cellulitis). TEE/DC-CV 06/06/13 which was successful and remains in NSR but has runs of atrial fibrillation.  CVP down to 4 yesteday.  Leg edema still present but improved.  Doing well overall.  ECHO 3/31: EF 40-45%, RV mildly dilated and sys fx mildly reduced  Objective:   Weight Range:  Vital Signs:   Temp:  [97.7 F (36.5 C)-98.1 F (36.7 C)] 97.7 F (36.5 C) (04/08 0603) Pulse Rate:  [75-87] 75 (04/08 0603) Resp:  [17-19] 17 (04/08 0603) BP: (106-119)/(37-66) 112/42 mmHg (04/08 0603) SpO2:  [95 %-98 %] 98 % (04/08 0603) Weight:  [189 lb 9.5 oz (86 kg)-191 lb 2.2 oz (86.7 kg)] 191 lb 2.2 oz (86.7 kg) (04/08 0603) Last BM Date: 06/11/13  Weight change: Filed Weights   06/12/13 0231 06/12/13 1027 06/13/13 0603  Weight: 190 lb 14.7 oz (86.6 kg) 189 lb 9.5 oz (86 kg) 191 lb 2.2 oz (86.7 kg)    Intake/Output:   Intake/Output Summary (Last 24 hours) at 06/13/13 0740 Last data filed at 06/12/13 2009  Gross per 24 hour  Intake  708.7 ml  Output     200 ml  Net  508.7 ml     Physical Exam: General: Chronically ill appearing. No resp difficulty  HEENT: normal  Neck: supple. JVP 7-8. Carotids 2+ bilat; no bruits. No lymphadenopathy or thryomegaly appreciated.  Cor: PMI nondisplaced. Tachy rate & regular rhythm. No rubs, gallops or murmurs.  Lungs: clear  Abdomen: soft, nontender, +distended. No hepatosplenomegaly. No bruits or masses. Good bowel sounds.  Extremities: no cyanosis, clubbing, rash, bilateral LE 1+ edema 1/2 to knees with erythema  Neuro: alert & orientedx3, cranial nerves grossly intact. moves all 4 extremities w/o difficulty. Affect pleasant  Telemetry: SR-ST 90-115s  Labs: Basic Metabolic Panel:  Recent Labs Lab 06/08/13 1540 06/09/13 0500 06/10/13 0500 06/11/13 0510 06/12/13 0400 06/13/13 0540  NA 136* 136* 134* 133* 136* 137  K 3.5* 4.2 5.2 4.7 4.6 4.5  CL 91* 91* 89* 89* 88* 92*  CO2 31 33* 33* 35* 35* 35*  GLUCOSE 185* 112* 174* 163* 121* 85  BUN 29* 27* 26* 24* 28* 26*  CREATININE 1.57* 1.55* 1.54* 1.60* 1.81* 1.69*  CALCIUM 8.5 8.6 8.7 8.7 9.2 9.0  MG 2.2  --   --   --   --   --     Liver Function Tests:  Recent Labs Lab 06/12/13 0400  AST 22  ALT 14  ALKPHOS 101  BILITOT 0.3  PROT 6.0  ALBUMIN 2.3*   No results found for this basename: LIPASE, AMYLASE,  in the  last 168 hours No results found for this basename: AMMONIA,  in the last 168 hours  CBC:  Recent Labs Lab 06/09/13 0500 06/10/13 0500 06/11/13 0510 06/13/13 0540  WBC 6.9 6.8 7.1 5.2  HGB 8.6* 8.5* 8.5* 8.8*  HCT 26.9* 26.5* 27.0* 27.8*  MCV 96.8 97.8 98.2 98.6  PLT 248 251 256 223    Cardiac Enzymes: No results found for this basename: CKTOTAL, CKMB, CKMBINDEX, TROPONINI,  in the last 168 hours  BNP: BNP (last 3 results)  Recent Labs  05/25/13 0402 05/28/13 0528 06/04/13 1248  PROBNP 5322.0* 3515.0* 5763.0*    Imaging: No results found.   Medications:     Scheduled Medications: . amiodarone  200  mg Oral BID  . darifenacin  7.5 mg Oral Daily  . iron polysaccharides  150 mg Oral Daily  . potassium chloride  40 mEq Oral Daily  . simvastatin  10 mg Oral q1800  . sodium chloride  10-40 mL Intracatheter Q12H  . spironolactone  25 mg Oral Daily  . torsemide  80 mg Oral Daily  . Warfarin - Pharmacist Dosing Inpatient   Does not apply q1800    Infusions:    PRN Medications: sodium chloride, acetaminophen, benzonatate, guaiFENesin, sodium chloride, traMADol   Assessment/Plan/Discussion   1) Acute on chronic systolic CHF with RV failure:  EF 40-45% with RV dysfunction on echo. Weight is down about 20 lbs with milrinone/Lasix gtt + metolazone. Creatinine stable.  CVP down to 4 yesterday.  - Continue torsemide at home. 2) Atypical atrial flutter with RVR.  Went for TEE/DC-CV 4/1. She is in NSR 80s this am, but has had periodic episodes of atrial flutter.  Continue amiodarone 200 mg bid until followup.   4) CKD: Stable mild creatinine elevation.  5) Cellulitis: LE erythema and warm to touch on arrival temp 101.5. Has been afebrile since admission. Has completed antibiotics course.  6) Disposition: Back to SNF today.  Needs followup in CHF clinic early next week.  She will go home on amiodarone 200 mg bid, KCl 20 daily, simvastatin 10, spironolactone 25, torsemide 80 daily, warfarin to be dosed at our [place] office.  Please make sure that she has INR followup.   Length of Stay: [place] 06/13/2013, 7:40 AM  Advanced Heart Failure Team Pager [phone] (M-F; 7a - 4p)  Please contact [facility] for night-coverage after hours (4p -7a ) and weekends on [email or web address]

## 2013-06-13 NOTE — Progress Notes (Signed)
Physical Therapy Treatment Patient Details Name: [name] [name] MRN: [id number] DOB: 09-19-1940 Today's Date: 06/13/2013    History of Present Illness Patient is a 73 year old obese widowed white female from [place] with remote history of rheumatic fever during childhood and long-standing history of chronic diastolic congestive heart failure who has recently experienced multiple recurrent episodes of acute exacerbation of chronic diastolic congestive heart failure.  She has been diagnosed with severe, symptomatic mitral regurgitation with recurrent paroxysmal atrial fibrillation S/P minimally invasive MVR. Pt DC to [facility] and returned to hospital  3/30 with bil LE edema and CHF    PT Comments    Pt continues to ambulate at very decreased speed due to fear of falling and decr activity tolerance. Pt ambulated on RA today and stayed >90% on O2 sats with cues throughout for pursed/deep breathing. [facility].   Follow Up Recommendations  SNF     Equipment Recommendations  None recommended by PT    Recommendations for Other Services       Precautions / Restrictions Precautions Precautions: Fall Precaution Comments: check O2 sats Restrictions Weight Bearing Restrictions: No    Mobility  Bed Mobility               General bed mobility comments: pt in recliner and returned to recliner   Transfers Overall transfer level: Needs assistance Equipment used: Rolling walker (2 wheeled) Transfers: Sit to/from Stand Sit to Stand: Min assist         General transfer comment: min (A) to acheive upright standing position; cues for hand placement and safety with RW: relies heavily on arm rests secondary to LE weakness  Ambulation/Gait Ambulation/Gait assistance: Min guard Ambulation Distance (Feet): 120 Feet Assistive device: Rolling walker (2 wheeled) Gait Pattern/deviations: Step-through pattern;Decreased stride length;Narrow base of  support;Trunk flexed Gait velocity: very decreased; cautious  Gait velocity interpretation: <1.8 ft/sec, indicative of risk for recurrent falls General Gait Details: cues for uprightposture and to increase step length and cadence; required multiiple standing rest breaks secondary to fatigue    Stairs            Wheelchair Mobility    Modified Rankin (Stroke Patients Only)       Balance Overall balance assessment: Needs assistance         Standing balance support: During functional activity;Bilateral upper extremity supported Standing balance-[name] Scale: Poor Standing balance comment: relies on bil UE support from RW                    Cognition Arousal/Alertness: Awake/alert Behavior During Therapy: [facility] for tasks assessed/performed Overall Cognitive Status: Within Functional Limits for tasks assessed                      Exercises General Exercises - Lower Extremity Ankle Circles/Pumps: AROM;Both;Seated;Strengthening;10 reps Long Arc Quad: AROM;Seated;Both;10 reps;Strengthening Hip Flexion/Marching: AROM;Strengthening;Both;10 reps;Seated Toe Raises: AROM;10 reps;Both;Strengthening;Seated    General Comments General comments (skin integrity, edema, etc.): O2 on RA at 94%; ambulating on RA at 90%; cues for pursed lip and deep breathing throughout  session      Pertinent Vitals/Pain See impression for vitals.     Home Living                      Prior Function            PT Goals (current goals can now be found in the care plan  section) Acute Rehab PT Goals Patient Stated Goal: to get back to my babies PT Goal Formulation: With patient Time For Goal Achievement: 06/19/13 Potential to Achieve Goals: Fair Progress towards PT goals: Progressing toward goals    Frequency  Min 2X/week    PT Plan Current plan remains appropriate    Co-evaluation             End of Session Equipment Utilized During Treatment: Gait  belt Activity Tolerance: Patient tolerated treatment well Patient left: in chair;with call bell/phone within reach;with family/visitor present     Time: 1022-1054 PT Time Calculation (min): 32 min  Charges:  $Gait Training: 8-22 mins $Therapeutic Exercise: 8-22 mins                    G CodesKennis Carina [place], [place] [phone] 06/13/2013, 11:04 AM

## 2013-06-13 NOTE — Progress Notes (Signed)
Remove  Oxygen per MD order, few second after remove the o2 patient complain of dizziness, checked o2 on RA it was 87-96%. PA notified,  Order PT to evaluate patient. Patient walk in hallway with PT o2 sustain in 94%/RA and no complain of pain or dizziness per PT. See PT note. Will continue to monitor patient .

## 2013-06-14 ENCOUNTER — Other Ambulatory Visit: Payer: Self-pay | Admitting: *Deleted

## 2013-06-14 MED ORDER — TRAMADOL HCL 50 MG PO TABS: 50.0000 mg | ORAL_TABLET | Freq: Two times a day (BID) | ORAL | Status: DC | PRN

## 2013-06-14 NOTE — Clinical Social Work Placement (Signed)
     Clinical Social Work Department CLINICAL SOCIAL WORK PLACEMENT NOTE 06/13/2013  Patient:  [name] [name], [name] [name]  Account Number:  [id number] Admit date:  06/04/2013  Clinical Social Worker:  Ky Barban, Latanya Presser  Date/time:  06/07/2013 01:06 PM  Clinical Social Work is seeking post-discharge placement for this patient at the following level of care:   SKILLED NURSING   (*CSW will update this form in Epic as items are completed)     Patient/family provided with [place] Department of Clinical Social Works list of facilities offering this level of care within the geographic area requested by the patient (or if unable, by the patients family).  06/07/2013  Patient/family informed of their freedom to choose among providers that offer the needed level of care, that participate in Medicare, Medicaid or managed care program needed by the patient, have an available bed and are willing to accept the patient.    Patient/family informed of [facility] ownership interest in [facility], as well as of the fact that they are under no obligation to receive care at this facility.  PASARR submitted to EDS on  PASARR number received from [place] on   FL2 transmitted to all facilities in geographic area requested by pt/family on  06/07/2013 FL2 transmitted to all facilities within larger geographic area on   Patient informed that his/her managed care company has contracts with or will negotiate with  certain facilities, including the following:   [employer] obtained per [place] November, [place]. Call from [facility] confirmed same.     Patient/family informed of bed offers received:  06/12/2013 Patient chooses bed at [facility] Physician recommends and patient chooses bed at    Patient to be transferred to [facility] on  06/13/2013 Patient to be transferred to facility by [facility]  with daughter  The following physician request were  entered in Epic:   Additional Comments: 06/13/13  Ok per MD for d/c today to SNF. CSW met with patient and her daughter who will transport her via car. Patient started crying when CSW informed her of finalized plans to go to SNF; Support was provided but then patient stated "these are happy tears!"  She is very relieved to have secured at bed at the [facility] as this is where she wanted to go for her rehab.  Daughter verbalized that she was pleased with d/c plan as her mother has required hospitalization frequently in the past 8 weeks.  Nursing called report to SNF; CSW signing off.  [name]. [place], [place]

## 2013-06-14 NOTE — Telephone Encounter (Signed)
[facility] 

## 2013-06-18 ENCOUNTER — Encounter (HOSPITAL_COMMUNITY): Payer: Self-pay

## 2013-06-18 ENCOUNTER — Ambulatory Visit (HOSPITAL_COMMUNITY)
Admission: RE | Admit: 2013-06-18 | Discharge: 2013-06-18 | Disposition: A | Discharge status: Home / Self Care | Payer: Commercial Managed Care - HMO | Source: Ambulatory Visit | Attending: Internal Medicine | Admitting: Internal Medicine

## 2013-06-18 VITALS — BP 168/81 | HR 74 | Resp 20 | Wt 187.2 lb

## 2013-06-18 DIAGNOSIS — I5032 Chronic diastolic (congestive) heart failure: Secondary | ICD-10-CM

## 2013-06-18 DIAGNOSIS — I5022 Chronic systolic (congestive) heart failure: Secondary | ICD-10-CM

## 2013-06-18 DIAGNOSIS — N183 Chronic kidney disease, stage 3 unspecified: Secondary | ICD-10-CM | POA: Insufficient documentation

## 2013-06-18 DIAGNOSIS — I4891 Unspecified atrial fibrillation: Secondary | ICD-10-CM | POA: Diagnosis not present

## 2013-06-18 DIAGNOSIS — N189 Chronic kidney disease, unspecified: Secondary | ICD-10-CM

## 2013-06-18 DIAGNOSIS — I5042 Chronic combined systolic (congestive) and diastolic (congestive) heart failure: Secondary | ICD-10-CM | POA: Insufficient documentation

## 2013-06-18 DIAGNOSIS — I509 Heart failure, unspecified: Secondary | ICD-10-CM

## 2013-06-18 LAB — BASIC METABOLIC PANEL
BUN: 28 mg/dL — ABNORMAL HIGH (ref 6–23)
CHLORIDE: 98 meq/L (ref 96–112)
CO2: 30 mEq/L (ref 19–32)
CREATININE: 1.79 mg/dL — AB (ref 0.50–1.10)
Calcium: 10 mg/dL (ref 8.4–10.5)
GFR, EST AFRICAN AMERICAN: 31 mL/min — AB (ref >90)
GFR, EST NON AFRICAN AMERICAN: 27 mL/min — AB (ref >90)
GLUCOSE: 94 mg/dL (ref 70–99)
POTASSIUM: 4.4 meq/L (ref 3.7–5.3)
Sodium: 143 mEq/L (ref 137–147)

## 2013-06-18 NOTE — Addendum Note (Signed)
Encounter addended by: Conrad [place], NP on: 06/18/2013  1:17 PM<BR>     Documentation filed: Notes Section

## 2013-06-18 NOTE — Progress Notes (Signed)
[name], female   DOB: [birth year], 73 y.o.   MRN: [id number]  Primary Physician: Dr. Quintin Alto  Primary Cardiologist: Dr. Harl Bowie  Cardiac Surgeon: Dr Ricard Dillon.   HPI: [name] [name] is a 73 yo obese white female with known history of rheumatic fever during childhood and long standing history of chronic diastolic congestive heart failure. She also has a history of CKD, HTN , CVA 2003,, and PAF.  Progressed to severe symptomatic Mitral Regurgitation with Paroxysmal Atrial Fibrillation and underwent minimally invasive maze procedure with MV replacement (05/16/13). Was discharged 05/31/13 to [facility] and weight was 207 lbs.  Admitted to [facility] 06/04/13 with volume overload. Diuresed with lasix drip, Milrinone and metolazone. As volume improved she was transitioned to torsemide 80 mg dialy and spironolactone 25 mg daily. .She also had successful DC-CV 06/06/13 for A-flutter RVR. Discharged on coumadin with INR followed by [facility] cardiology. Discharge weight 191 pounds. Discharged to [facility].   She returns for follow up. Overall she feels good and says she is getting stronger every day. Currently at [facility] for ongoing rehab. Denies SOB/PND/Orthopnea/CP. No bleeding problems. Weight at SNF 191 pounds today. Chronic lower extremity swelling. Medications provided at [name]. Plans to return home after 20 days of rehab.    ECHO 3/31: EF 40-45%, RV mildly dilated and sys fx mildly reduced  Labs: 06/13/13 K 4.5 Creatinine 1.69 Labs: 06/18/13 K 4.4 Creatinine 1.79  SH: At [facility] SNF . Never smoked. She does drink alcohol.  FH: Father and brother had MI  ROS: All systems negative except as listed in HPI, PMH and Problem List.  Past Medical History  Diagnosis Date  . Chronic airway obstruction, not elsewhere classified   . Precordial pain   . Swelling of limb   . Dizziness and giddiness   . Unspecified transient cerebral ischemia     Diag. 2003  . Acute, but ill-defined,  cerebrovascular disease     2000 [name] [name]  . Shortness of breath   . Obesity   . Juvenile rheumatic fever     age 55  . Unspecified essential hypertension   . Hyperlipidemia   . Mitral regurgitation 02/16/2013  . Chronic kidney disease   . CHF (congestive heart failure) 04/23/2013  . Paroxysmal atrial fibrillation 02/16/2013    Recurrent paroxysmal atrial fibrillation, first diagnosed December 2014  . Chronic diastolic congestive heart failure   . Arthritis     Chronic left knee pain  . Aortic insufficiency due to bicuspid aortic valve     moderate by TEE  . 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 S/P Maze operation for atrial fibrillation 05/16/2013    Complete bilateral atrial lesion set using cryothermy and bipolar radiofrequency ablation with oversewing of LA appendage    Current Outpatient Prescriptions  Medication Sig Dispense Refill  . traMADol (ULTRAM) 50 MG tablet Take 1 tablet (50 mg total) by mouth every 12 (twelve) hours as needed for moderate pain.  60 tablet  5   No current facility-administered medications for this encounter.     PHYSICAL EXAM: Filed Vitals:   06/18/13 1023  BP: 168/81  Pulse: 74  Resp: 20  Weight: 187 lb 4 oz (84.936 kg)  SpO2: 94%    General:  Well appearing. No resp difficulty Sitting in wheelchair. Sister present. HEENT: normal Neck:  supple. JVP 9-10. Carotids 2+ bilaterally; no bruits. No lymphadenopathy or thryomegaly appreciated. Cor: PMI normal. Regular rate & rhythm. No rubs, gallops or murmurs. Lungs: clear Abdomen: soft, nontender, nondistended. No hepatosplenomegaly. No bruits or masses. Good bowel sounds. Extremities: no cyanosis, clubbing, rash, R and LLE 2+ edema with bilateral ted hose.  Neuro: alert & orientedx3, cranial nerves grossly intact. Moves all 4 extremities w/o  difficulty. Affect pleasant.    ASSESSMENT & PLAN: I reviewed discharge summary from 06/13/2013. Discharged on Toporol XL 12.5 mg daily however she was not on this a SNF.  1. 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 EF 40-45% with RV dysfunction on echo in early April 2015.  NYHA III.  Volume status mildly elevated but this is likely due to liberalized diet at SNF. Continue torsemide 80 mg daily and 25 mg spiro. Will give one dose of 2.5 mg metolazone to see if we can get a few pounds off.   Add 3.125 mg carvedilol twice a day .  Consider hydralazine/imdur at next visit. No Ace due to CKD.  Continue lower extremity compression hose.  Provide SNF HF orders for daily weights, low salt diet, and 2 liter per day fluid restriction. I have also asked that she receive [place] when she is discharged from SNF.  Check BMET---> Labs: 06/18/13 K 4.4 Creatinine 1.79 2.  History of A flutter RVR.  S/P successful DC-CV 06/06/13. Regular rate.  Cut back amiodarone to 200 mg daily. Continue coumadin. No bleeding problems. Next visit check TSH and LFTs.  3. HTN- elevated. Add  3.125 mg carvedilol twice a day.  4. CKD: creatinine baseline 1.6-1.8. Check BMET today.Labs: 06/18/13 K 4.4 Creatinine 1.79   Follow up in 1 month with Dr Aundra Dubin. Discussed with Dr Aundra Dubin ok to add carvedilol.   Amy D Clegg Np-C  12:42 PM

## 2013-06-18 NOTE — Progress Notes (Signed)
[name], female   DOB: 1940/09/03, 73 y.o.   MRN: [id number]

## 2013-06-20 ENCOUNTER — Non-Acute Institutional Stay (SKILLED_NURSING_FACILITY): Payer: Medicare HMO | Admitting: Internal Medicine

## 2013-06-20 DIAGNOSIS — I4891 Unspecified atrial fibrillation: Secondary | ICD-10-CM

## 2013-06-20 DIAGNOSIS — I509 Heart failure, unspecified: Secondary | ICD-10-CM

## 2013-06-20 DIAGNOSIS — R269 Unspecified abnormalities of gait and mobility: Secondary | ICD-10-CM

## 2013-06-20 DIAGNOSIS — I5041 Acute combined systolic (congestive) and diastolic (congestive) heart failure: Secondary | ICD-10-CM

## 2013-06-20 DIAGNOSIS — D62 Acute posthemorrhagic anemia: Secondary | ICD-10-CM

## 2013-06-20 DIAGNOSIS — N183 Chronic kidney disease, stage 3 unspecified: Secondary | ICD-10-CM

## 2013-06-20 NOTE — Progress Notes (Signed)
[name], female   DOB: 1940-12-08, 73 y.o.   MRN: [id number] Facility; [facility] Chief complaint; admission to SNF postdate at [facility] 3/30 through 4/8 2015 History; this is a 73 year old lady who has a history of severe mitral regurgitation and underwent a mitral valve replacement. She was discharged to the [facility] in the evening on 3/25. She was seen in followup by cardiology on 3/30 complaining of orthopnea lower extremity edema diarrhea and low-grade fever she was admitted to the hospital for these reasons.  The patient was treated aggressively with Lasix 80 IV 3 times a day for acute on chronic diastolic heart failure. Her creatinine increased metolazone was added to help with the diuresis. She ultimately was started on a Lasix drip. She was placed on a milrinone drip as well. Her condition improved.  She was also in atrial fibrillation on admission and had atrial flutter. She was started on IV amiodarone. She underwent TEE cardioversion in order to restore sinus rhythm and help with her heart failure. She successfully converted to sinus rhythm  She also had lower extremity cellulitis  Past medical history/problem list #1 acute on chronic diastolic heart failure although she also has systolic dysfunction according to her most recent echo #2 atrial fibrillation/flutter now on amiodarone and Coumadin #3 lower extremity cellulitis #4 chronic kidney disease stage III at discharge after her surgery her creatinine was 1.48. On admission this time it was 1.68 and peaked at 1.8 during this hospitalization. We have already checked this on 4/13 her BUN was 29 and a creatinine of 1.89 #5 severe mitral regurgitation status post bio prosthetic mitral valve replacement. #6 anemia #7 deconditioning #8 patient states she has had a stroke many years ago but made a full recovery  Medications; Amiodarone 200 twice a day ASA 81 daily Tessalon 100 mg twice a day when necessary  for cough Calcium/vitamin D 500/200 one tablet daily Mucinex 600 every 12 Imodium 1 tablet as needed for loose stools Niferex 150 mg daily The Toprolol 12.5 twice a day Ditropan XL 5 mg at bedtime Klor-Con 20 mEq daily Simvastatin 10 mg daily Aldactone 25 daily Demadex 80 mg daily Tramadol when necessary Coumadin 2 mg Monday and Friday and 3 mg other days  Social; patient states she lives in an eat and in her on home a child and his family live with her. 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 She has never used smokeless tobacco. She reports that she does not drink alcohol or use illicit drugs.  indicated that her mother is deceased. She indicated that her father is deceased. She indicated that her brother is deceased.   Review of systems Respiratory; she is not complaining of shortness of breath or cough Cardiac no exertional chest pain Abdomen no abdominal pain GU no dysuria Neurologic states she is still somewhat unsteady on her feet without a walker  Physical examination Gen. patient looks remarkably well in no distress Vitals O2 sat 95% on room air pulse 62 and regular respirations 18 and unlabored Respiratory; clear entry bilaterally, few crackles in the right lower lobe left lung is clear Cardiac heart sounds are normal there is no S3 no murmurs although she was not properly positioned for auscultation. JVP is not elevated. She has 3+ edema while up into her upper thighs but no coccyx edema Abdomen; somewhat distended however there is no overt tenderness.  Impressions/.plan #1 congestive heart failure.  He should still appears to have massive lower extremity edema. Apparently she had her diuretics increased yesterday although I don't see that order nor do I see that consultation. It would appear that she came into the facility at 195.9 pounds. Yesterday she was 193.2 pounds. A weight loss of one to 2 pounds per day is  probably satisfactory as long as this continues. She is on torsemide 80 mg and Spiriva lactone 25. Will increase her torsemide 100 mg daily #2 atrial fibrillation/flutter. This is now stable with a regular pulse rate at 62 #3 chronic renal failure. The exact nature of this is not completely clear to me at the moment. Nevertheless it appears to be stable we will check this early next week as well as her potassium #4 gait ataxia with bilateral Trendelenburg gait. I did not get a history that the patient had this prior to admission however this will require some work up. At this point she will certainly need a walker #5 anemia this will be rechecked next week as well  I have increased this lady's torsemide will follow the weight that carefully. Lab work next week as well as hemoglobin. PT and INR will be checked on Friday Gait; unsteady without her walker she has a bilateral Trendelenburg gait

## 2013-06-22 ENCOUNTER — Encounter: Payer: Self-pay | Admitting: Internal Medicine

## 2013-06-22 ENCOUNTER — Non-Acute Institutional Stay (SKILLED_NURSING_FACILITY): Payer: Medicare HMO | Admitting: Internal Medicine

## 2013-06-22 DIAGNOSIS — I5030 Unspecified diastolic (congestive) heart failure: Secondary | ICD-10-CM

## 2013-06-22 DIAGNOSIS — I4891 Unspecified atrial fibrillation: Secondary | ICD-10-CM

## 2013-06-22 DIAGNOSIS — Z7901 Long term (current) use of anticoagulants: Secondary | ICD-10-CM

## 2013-06-22 NOTE — Progress Notes (Signed)
[name], female   DOB: 03-Sep-1940, 73 y.o.   MRN: [id number]   This is an acute visit.  Level of care skilled.  Facility [facility].  Chief complaint acute visit followup edema hx CHF.----Anticoagulation management with history of atrial fibrillation also tissue valve replacement  History of present illness.  Patient is a pleasant 73 year old female who was recently seen Dr. Adam Phenix does have a history of congestive heart failure systolic and diastolic.  Dr. Dellia Nims was concerned about her edema and increased her torsemide from 80-200 mg a day she's also on Aldactone 25 mg a day as well as potassium 20 mEq a day.  She also has a history of renal insufficiency with a baseline creatinine in the high ones.  Creatinine today is 2.16 with a BUN of 37 back on April 13 BUN was 29 with creatinine of 1.89.  She says her breathing is better and her weight continues to decrease apparently earlier this week was 193.9 on April 15 today is 190.6 back on April 11 it was 195.1  She also has a history of atrial fibrillation as well as a tissue valve replacement-INR today is 3.25 appears she is on alternating doses of Coumadin  2  and 3 mg a day    .  Family medical social history as been reviewed per progress note on 06/20/2013.  Medications have been reviewed per MAR.  Review of systems.  General no complaints of fever or chills.  Her sclerae says her breathing is a bit better no cough.  Cardiac does not complaining of any chest pain.  Abdomen no abdominal discomfort.  Physical exam.  Temperature is 97.8 pulse 64 respirations 18 blood pressure 108/68-weight is 190.6 O2 sats are in the 90s on room air.  In general this is a pleasant elderly female in no distress sitting comfortably in her chair.  Her skin is warm and dry.  Chest is clear to auscultation without rhonchi rales or wheezes no labored breathing.  Heart is regular rate and rhythm without murmur gallop  or rub.  Abdomen is soft nontender with positive bowel sounds.  Extremities she continues with significant edema however apparently this is improved per nursing from what it was earlier this week she has TED hose on --I would estimate 2+ edema bilaterally.  Labs.  06/22/2013.  WBC 5.2 hemoglobin 9.0 platelets 228.  Sodium 145 potassium 4.1 BUN 37 creatinine 2.16.  INR-3.25.  INR on April 13 was 2.68.  Assessment and plan.  #1 as history CHF systolic/diastolic-this appears to be stable she is gradually losing a small amount of weight---I suspect this is edema-I did discuss this with Dr. Dellia Nims via phone and we'll continue the current torsemide and Aldactone dose-this is complicated with a history of chronic renal insufficiency-creatinine is going up a bit from her baseline but medically she appears stable we'll need to recheck this on Monday, April 20  #2 anticoagulation management-INR is trending up-we'll decrease Coumadin to 1 mg tonight and recheck this tomorrow and adjust accordingly clinically she appears stable without any increased bruising or bleeding.  [id number]

## 2013-06-25 ENCOUNTER — Ambulatory Visit: Payer: Medicare HMO | Admitting: Thoracic Surgery (Cardiothoracic Vascular Surgery)

## 2013-06-28 ENCOUNTER — Ambulatory Visit (INDEPENDENT_AMBULATORY_CARE_PROVIDER_SITE_OTHER): Payer: Commercial Managed Care - HMO | Admitting: Adult Health

## 2013-06-28 ENCOUNTER — Encounter: Payer: Self-pay | Admitting: Adult Health

## 2013-06-28 ENCOUNTER — Encounter: Payer: Self-pay | Admitting: Internal Medicine

## 2013-06-28 ENCOUNTER — Non-Acute Institutional Stay (SKILLED_NURSING_FACILITY): Payer: Medicare HMO | Admitting: Internal Medicine

## 2013-06-28 VITALS — BP 125/55 | HR 65 | Ht 62.0 in | Wt 186.0 lb

## 2013-06-28 DIAGNOSIS — R0602 Shortness of breath: Secondary | ICD-10-CM

## 2013-06-28 DIAGNOSIS — I4891 Unspecified atrial fibrillation: Secondary | ICD-10-CM

## 2013-06-28 DIAGNOSIS — Z9889 Other specified postprocedural states: Secondary | ICD-10-CM

## 2013-06-28 DIAGNOSIS — N189 Chronic kidney disease, unspecified: Secondary | ICD-10-CM

## 2013-06-28 DIAGNOSIS — I5022 Chronic systolic (congestive) heart failure: Secondary | ICD-10-CM

## 2013-06-28 DIAGNOSIS — I5032 Chronic diastolic (congestive) heart failure: Secondary | ICD-10-CM

## 2013-06-28 DIAGNOSIS — D649 Anemia, unspecified: Secondary | ICD-10-CM

## 2013-06-28 DIAGNOSIS — Z8679 Personal history of other diseases of the circulatory system: Secondary | ICD-10-CM

## 2013-06-28 DIAGNOSIS — I10 Essential (primary) hypertension: Secondary | ICD-10-CM

## 2013-06-28 DIAGNOSIS — I509 Heart failure, unspecified: Secondary | ICD-10-CM

## 2013-06-28 NOTE — Progress Notes (Signed)
[name], female   DOB: Jun 10, 1940, 73 y.o.   MRN: [id number]   this is a discharge note.  Level of care skilled.  Facility [facility]     Chief complaint;Discharge note  History; this is a 73 year old lady who has a history of severe mitral regurgitation and underwent a mitral valve replacement. She was [facility] to [facility] in the evening on 3/25. She was seen in followup by cardiology on 3/30 complaining of orthopnea lower extremity edema diarrhea and low-grade fever she was admitted to the hospital for these reasons.   The patient was treated aggressively with Lasix 80 IV 3 times a day for acute on chronic diastolic heart failure. Her creatinine increased metolazone was added to help with the diuresis. She ultimately was started on a Lasix drip. She was placed on a milrinone drip as well. Her condition improved This appears to have stabilized during her stay here and actually was seen by cardiology today and got a favorable report.  She continues on torsemide 100 mg a day as well as Aldactone 25 mg a day - as well as potassium supplementation  Her weight is stable to slowly declining current weight 189.5 which is down about 6 pounds since April 9 .  .   She was also in atrial fibrillation on admission and had atrial flutter. She was started on IV amiodarone. She underwent TEE cardioversion in order to restore sinus rhythm and help with her heart failure. She successfully converted to sinus rhythm This has been rate controlled during her stay here with pulses running in the 60-80s area-she continues on amiodarone.  She also is on Coumadin INR is therapeutic at 2.9  Of note she also does have a history of a bioprosthetic mitral valve replacement.  Patient has rehabed here fairly well and her cardiac issues appear to be stable -- actually saw her cardiology office today and got a favorable report.  She will be going home and need a rolling walker  secondary to some continued weakness-also will have to have followup her for PT/INR by home health.  She says she will be home with a grandson  who will be with her most of the time.  She will need continued therapy       Past medical history/problem list #1 acute on chronic diastolic heart failure although she also has systolic dysfunction according to her most recent echo #2 atrial fibrillation/flutter now on amiodarone and Coumadin #3 lower extremity cellulitis--appears to have resolved #4 chronic kidney disease stage III at discharge after her surgery her creatinine was 1.48. On admission this time it was 1.68 and peaked at 1.8 during this hospitalization. His recent metabolic panel on April 20 showed a creatinine s at 2.17 a BUN of 37.  Appears this is on the higher end of her baseline  #5 severe mitral regurgitation status post bio prosthetic mitral valve replacement. #6 anemia #7 deconditioning #8 patient states she has had a stroke many years ago but made a full recovery   Medications; Amiodarone 200 twice a day ASA 81 daily Tessalon 100 mg twice a day when necessary for cough Calcium/vitamin D 500/200 one tablet daily Mucinex 600 every 12 Imodium 1 tablet as needed for loose stools Niferex 150 mg daily Coreg-12.5 mg twice a day Ditropan XL 5 mg at bedtime Klor-Con 20 mEq daily Simvastatin 10 mg daily Aldactone 25 daily Demadex 100 mg daily Tramadol when necessary Coumadin 2 mg QD   Social; apparently  patient lives alone but has strong family support. 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 She has never used smokeless tobacco. She reports that she does not drink alcohol or use illicit drugs.   indicated that her mother is deceased. She indicated that her father is deceased. She indicated that her brother is deceased.    Review of systems Gen. no complaints fever chills says she feels relatively  well Respiratory; she is not complaining of shortness of breath or cough Cardiac no exertional chest pain Abdomen no abdominal pain GU no dysuria Neurologic states she is still somewhat unsteady on her feet without a walker--apparently this has improved some during her stay here but certainly would benefit from continued therapy   Physical examination  Temperature 97.8 pulse 61 respirations 18 blood pressure 98/59.  Weight is 189.5 In general this is a somewhat obese very pleasant elderly female in no distress sitting comfortably in her chair.  Her skin is warm and dry.  Oropharynx clear mucous membranes moist.    Respiratory; clear entry bilaterally, no labored breathing Cardiac heart sounds are normal t--.Marland Kitchen She has 2+ edema extremity she does have compression stockings on Abdomen; somewhat distended however there is no overt tenderness sounds are positive.  Muscle skeletal woos all extremities x4 at she does ambulate with a rolling walker and appears to do fairly well with this-at times it appears she does have trouble rising from a seated position if the chair position is quite low however apparently does significant better with higher chair position and she says this will be the case when she goes home she does have somewhat of a high standing chai  Neurologic is grossly intact her speech is clear.  Psych is alert and oriented x3 pleasant and appropriate .  Labs. 06/28/2013.  INR-2.9  06/25/2013.  INR 2.38.  Sodium 142 potassium 4.4 BUN 37 creatinine 2.17.  06/22/2013.  WBC 5.2 hemoglobin 9.0 platelets 228.  Assessment and pla #1 congestive heart failure. This appears to have stabilized on torsemide 100 mg a day and Aldactone 25 mg daily-she was seen by cardiology earlier today and felt to be doing well we'll continue current medications with expedient followup by cardiology which appears to be the case  #2 atrial fibrillation/flutter. This is now stable with a  regular pulse rate in the 60s-he is on amiodarone as well as Coreg--INR has risen a bitpher labs done today will decrease Coumadin to 1 mg a day and recheck this tomorrow #3 chronic renal failure.--Creatinine appears to be on the higher and at baseline Will recheck this before discharge-again I suspect this is a balancing act with a history of CHF clinically appears to be stable   #4 gait ataxia with bilateral Trendelenburg gait She will need PT OT after discharge as well as a rolling walker secondary to weakness and fall risk #5 anemia t--she is on iron Will recheck this tomorrow as well  At this point patient will be going home alone with strong family support-will need updated INR by home health I suspect early next week with primary care provider notified of results-also will need continued physical and occupational therapy-as well as nursing and home health support for her medical issues including atrial fibrillation and congestive heart failure .  [id number]--VE note greater than 30 minutes spent on this discharge summary-greater than 50% of time spent coordinating plan of care.

## 2013-06-28 NOTE — Assessment & Plan Note (Signed)
She is due to followup with Dr. Halford Chessman within the next 2 weeks. She has not felt any racing of her heart, she is not having any chest pain or shortness.

## 2013-06-28 NOTE — Progress Notes (Deleted)
Name: [name] [name]    DOB: 04/22/1940  Age: 73 y.o.  MR#: [id number]       PCP:  [name], MD      Insurance: Payor: [employer]IC[employer] / Plan: [employer] MEDICARE HMO / Product Type: *No Product type* /   CC:    Chief Complaint  Patient presents with  . Atrial Fibrillation  . Congestive Heart Failure    VS Filed Vitals:   06/28/13 1400  BP: 125/55  Pulse: 65  Height: 5\' 2"  (1.575 m)  Weight: 186 lb (84.369 kg)    Weights Current Weight  06/28/13 186 lb (84.369 kg)  06/18/13 187 lb 4 oz (84.936 kg)  06/13/13 191 lb 2.2 oz (86.7 kg)    Blood Pressure  BP Readings from Last 3 Encounters:  06/28/13 125/55  06/22/13 108/68  06/18/13 168/81     Admit date:  (Not on file) Last encounter with RMR:  Visit date not found   Allergy Indomethacin and Norvasc  Current Outpatient Prescriptions  Medication Sig Dispense Refill  . traMADol (ULTRAM) 50 MG tablet Take 1 tablet (50 mg total) by mouth every 12 (twelve) hours as needed for moderate pain.  60 tablet  5   No current facility-administered medications for this visit.    Discontinued Meds:    Medications Discontinued During This Encounter  Medication Reason  . amiodarone (PACERONE) 200 MG tablet     Patient Active Problem List   Diagnosis Date Noted  . Long term (current) use of anticoagulants 06/22/2013  . Chronic systolic heart failure 40/34/7425  . S/P MVR (mitral valve replacement) 06/04/2013  . Acute diastolic heart failure 95/63/8756  . CVA (cerebral infarction) 05/21/2013  . Acute pulmonary edema 05/18/2013  . 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 S/P Maze operation for atrial fibrillation 05/16/2013  . Aortic insufficiency due to bicuspid aortic valve   . Chronic kidney disease   . Paroxysmal atrial fibrillation   . Chronic diastolic congestive heart failure   . Arthritis   . Acute respiratory failure 05/02/2013  . Atrial  fibrillation with RVR 04/29/2013  . Shortness of breath   . Juvenile rheumatic fever   . Diastolic CHF, acute on chronic 04/23/2013  . CHF (congestive heart failure) 04/23/2013  . Mitral regurgitation 02/16/2013  . Diastolic heart failure 43/32/9518  . HTN (hypertension) 02/15/2011    LABS    Component Value Date/Time   NA 143 06/18/2013 1057   NA 137 06/13/2013 0540   NA 136* 06/12/2013 0400   K 4.4 06/18/2013 1057   K 4.5 06/13/2013 0540   K 4.6 06/12/2013 0400   CL 98 06/18/2013 1057   CL 92* 06/13/2013 0540   CL 88* 06/12/2013 0400   CO2 30 06/18/2013 1057   CO2 35* 06/13/2013 0540   CO2 35* 06/12/2013 0400   GLUCOSE 94 06/18/2013 1057   GLUCOSE 85 06/13/2013 0540   GLUCOSE 121* 06/12/2013 0400   BUN 28* 06/18/2013 1057   BUN 26* 06/13/2013 0540   BUN 28* 06/12/2013 0400   CREATININE 1.79* 06/18/2013 1057   CREATININE 1.69* 06/13/2013 0540   CREATININE 1.81* 06/12/2013 0400   CALCIUM 10.0 06/18/2013 1057   CALCIUM 9.0 06/13/2013 0540   CALCIUM 9.2 06/12/2013 0400   GFRNONAA 27* 06/18/2013 1057   GFRNONAA 29* 06/13/2013 0540   GFRNONAA 27* 06/12/2013 0400   GFRAA 31* 06/18/2013 1057   GFRAA 34* 06/13/2013 0540   GFRAA 31*  06/12/2013 0400   CMP     Component Value Date/Time   NA 143 06/18/2013 1057   K 4.4 06/18/2013 1057   CL 98 06/18/2013 1057   CO2 30 06/18/2013 1057   GLUCOSE 94 06/18/2013 1057   BUN 28* 06/18/2013 1057   CREATININE 1.79* 06/18/2013 1057   CALCIUM 10.0 06/18/2013 1057   PROT 6.0 06/12/2013 0400   ALBUMIN 2.3* 06/12/2013 0400   AST 22 06/12/2013 0400   ALT 14 06/12/2013 0400   ALKPHOS 101 06/12/2013 0400   BILITOT 0.3 06/12/2013 0400   GFRNONAA 27* 06/18/2013 1057   GFRAA 31* 06/18/2013 1057       Component Value Date/Time   WBC 5.2 06/13/2013 0540   WBC 7.1 06/11/2013 0510   WBC 6.8 06/10/2013 0500   HGB 8.8* 06/13/2013 0540   HGB 8.5* 06/11/2013 0510   HGB 8.5* 06/10/2013 0500   HCT 27.8* 06/13/2013 0540   HCT 27.0* 06/11/2013 0510   HCT 26.5* 06/10/2013 0500   MCV 98.6 06/13/2013 0540   MCV 98.2 06/11/2013  0510   MCV 97.8 06/10/2013 0500    Lipid Panel     Component Value Date/Time   CHOL 89 05/23/2013 0240   TRIG 134 05/23/2013 0240   HDL 27* 05/23/2013 0240   CHOLHDL 3.3 05/23/2013 0240   VLDL 27 05/23/2013 0240   LDLCALC 35 05/23/2013 0240    ABG    Component Value Date/Time   PHART 7.413 05/20/2013 0401   PCO2ART 42.1 05/20/2013 0401   PO2ART 71.0* 05/20/2013 0401   HCO3 26.8* 05/20/2013 0401   TCO2 28 05/20/2013 0401   ACIDBASEDEF 2.0 05/17/2013 1626   O2SAT 82.4 06/10/2013 0500     Lab Results  Component Value Date   TSH 4.293 04/28/2013   BNP (last 3 results)  Recent Labs  05/25/13 0402 05/28/13 0528 06/04/13 1248  PROBNP 5322.0* 3515.0* 5763.0*   Cardiac Panel (last 3 results) No results found for this basename: CKTOTAL, CKMB, TROPONINI, RELINDX,  in the last 72 hours  Iron/TIBC/Ferritin No results found for this basename: iron, tibc, ferritin     EKG Orders placed during the hospital encounter of 06/04/13  . EKG 12-LEAD  . EKG 12-LEAD  . ED EKG  . ED EKG  . EKG 12-LEAD  . EKG 12-LEAD  . EKG 12-LEAD  . EKG 12-LEAD  . EKG 12-LEAD  . EKG 12-LEAD  . EKG 12-LEAD  . EKG 12-LEAD     Prior Assessment and Plan Problem List as of 06/28/2013     Cardiovascular and Mediastinum   Diastolic heart failure   HTN (hypertension)   Mitral regurgitation   CHF (congestive heart failure)   Paroxysmal atrial fibrillation   Chronic diastolic congestive heart failure   Aortic insufficiency due to bicuspid aortic valve   Diastolic CHF, acute on chronic   Atrial fibrillation with RVR   S/P MVR (mitral valve replacement)   Acute diastolic heart failure   Chronic systolic heart failure     Respiratory   Acute respiratory failure   Acute pulmonary edema     Nervous and Auditory   CVA (cerebral infarction)     Musculoskeletal and Integument   Arthritis     Genitourinary   Chronic kidney disease     Other   Shortness of breath   Juvenile rheumatic fever   S/P  minimally invasive mitral valve replacement with bioprosthetic valve and maze procedure   S/P Maze operation for atrial fibrillation  Long term (current) use of anticoagulants       Imaging: Dg Chest 1 View  06/04/2013   CLINICAL DATA:  Shortness of breath. Status post mitral valve replacement and Maze procedure on 05/16/2013.  EXAM: CHEST - 1 VIEW  COMPARISON:  DG CHEST 2 VIEW dated 05/26/2013; DG CHEST 2 VIEW dated 05/23/2013; DG CHEST 1V PORT dated 05/22/2013; DG CHEST 2 VIEW dated 03/20/2013; CT ANGIO CHEST AORTA W/CM & WO/CM dated 05/07/2013; DG CHEST 1V PORT dated 05/17/2013  FINDINGS: There is stable cardiomegaly. Mitral valve replacement visible and stable in appearance lungs show some chronic opacity in the right lower lung zone which has essentially been stable following cardiac surgery. No overt edema is identified. There is no evidence of significant pleural fluid or pneumothorax.  IMPRESSION: Stable cardiomegaly and postoperative right lower lung opacity which is likely a combination of atelectasis and scarring.   Electronically Signed   By: Aletta Edouard M.D.   On: 06/04/2013 11:20   Dg Chest 2 View  06/05/2013   CLINICAL DATA:  Short of breath.  Weakness.  EXAM: CHEST  2 VIEW  COMPARISON:  DG CHEST 1 VIEW dated 06/04/2013; DG CHEST 2 VIEW dated 05/26/2013; DG CHEST 2 VIEW dated 05/23/2013; DG CHEST 2 VIEW dated 03/20/2013  FINDINGS: Cardiomegaly is present, stable. Bioprosthetic mitral valve replacement. Opacity is present at the right lung base, slightly improved compared to the recent prior examination yesterday. This may represent improving asymmetric pulmonary edema with superimposed airspace disease/pneumonia. Small pleural effusion is present with blunting of the right costophrenic angle. Monitoring leads project over the chest.  IMPRESSION: Mild improvement in airspace disease at the right lung base. Small right pleural effusion.   Electronically Signed   By: Dereck Ligas M.D.   On:  06/05/2013 08:26

## 2013-06-28 NOTE — Patient Instructions (Signed)
Your physician recommends that you schedule a follow-up appointment in: 6 weeks with Dr Harl Bowie in the [place] office.  Your physician recommends that you continue on your current medications as directed. Please refer to the Current Medication list given to you today.

## 2013-06-28 NOTE — Assessment & Plan Note (Signed)
Early well-controlled at 125/55. No changes are made on her medication regimen at this time but will have one month followup with close evaluation of blood pressure control as an outpatient. He currently resides at the [facility] in a controlled environment. Will be much easier to titrate medications when she returns home knowing what her blood pressure is like there.

## 2013-06-28 NOTE — Assessment & Plan Note (Signed)
The patient is done well from our standpoint. She does not have any issues with fluid retention. Her weight is essentially stable at iron 86 pounds. She is being provided medication [place]. But is due to return to her normal home environment in the morning. She will continue carvedilol as directed, along with torsemide 80 mg daily and 25 mg a spinal lactone. Would not make any other medicine changes at this time but will consider adding hydralazine on followup visit when she is in her home environment. She is advised a low-sodium diet, and not to be eating foods that are processed, canned or fast food. She may need to be considered for [facility] services when she returns home as well. She will followup with Dr. Harl Bowie in the office as this is where she resides any disease year for her to come to that office. She will not be placed on ACE secondary to chronic kidney disease

## 2013-06-28 NOTE — Progress Notes (Signed)
HPI: [name] [name] is a 73 y/o female patient of Dr. Harl Bowie we are following for ongoing assessment and management of atrial fib, chronic diastolic CHF, with hx of CKD, Hypertension, CVA. She has MV replacement 05/16/2013. She is s/p DCCV 4/1./2015.     She has been see by Darrick Grinder NP in the CHF clinic with medication adjustments to include addition of carvedilol. She was given on dose of metolazone due to fluid retention, wt 187 lbs on last office visit. The patient states she was uncertain if she diuresed more and she uses a incontinent diaper all the time. She is not noticing any change in any lower extremity edema or worsening thereof. Her breathing status is about the same. She is very sedentary with the exception of using physical therapy at the [facility] rehabilitation. She is due to go home in the morning and will continue outpatient physical therapy. She has family members with whom she lives who will continue to care for her.  She is without any complaints of shortness of breath chest pain or fluid retention.       Allergies  Allergen Reactions  . Indomethacin Other (See Comments)    dizziness  . Norvasc [Amlodipine Besylate] Cough    Current Outpatient Prescriptions  Medication Sig Dispense Refill  . traMADol (ULTRAM) 50 MG tablet Take 1 tablet (50 mg total) by mouth every 12 (twelve) hours as needed for moderate pain.  60 tablet  5   No current facility-administered medications for this visit.    Past Medical History  Diagnosis Date  . Chronic airway obstruction, not elsewhere classified   . Precordial pain   . Swelling of limb   . Dizziness and giddiness   . Unspecified transient cerebral ischemia     Diag. 2003  . Acute, but ill-defined, cerebrovascular disease     2000 [place]  . Shortness of breath   . Obesity   . Juvenile rheumatic fever     age 57  . Unspecified essential hypertension   . Hyperlipidemia   . Mitral regurgitation 02/16/2013  . Chronic kidney  disease   . CHF (congestive heart failure) 04/23/2013  . Paroxysmal atrial fibrillation 02/16/2013    Recurrent paroxysmal atrial fibrillation, first diagnosed December 2014  . Chronic diastolic congestive heart failure   . Arthritis     Chronic left knee pain  . Aortic insufficiency due to bicuspid aortic valve     moderate by TEE  . 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 S/P Maze operation for atrial fibrillation 05/16/2013    Complete bilateral atrial lesion set using cryothermy and bipolar radiofrequency ablation with oversewing of LA appendage    Past Surgical History  Procedure Laterality Date  . Total knee arthroplasty Right   . Abdominal hysterectomy      Cervical Cancer  . Parathyroid/thyroid surgery      tumor  . Tee without cardioversion N/A 04/06/2013    Procedure: TRANSESOPHAGEAL ECHOCARDIOGRAM (TEE);  Surgeon: [name], MD;  [facility];  Service: Cardiology;  Laterality: N/A;  . Minimally invasive maze procedure N/A 05/16/2013    Procedure: MINIMALLY INVASIVE MAZE PROCEDURE;  Surgeon: Rexene Alberts, MD;  Location: [place];  Service: Open Heart Surgery;  Laterality: N/A;  . Intraoperative transesophageal echocardiogram N/A 05/16/2013    Procedure: INTRAOPERATIVE TRANSESOPHAGEAL ECHOCARDIOGRAM;  Surgeon: Rexene Alberts, MD;  Location: [place];  Service: Open Heart Surgery;  Laterality: N/A;  . Mitral valve replacement Right 05/16/2013    Procedure: MINIMALLY INVASIVE MITRAL VALVE (MV) REPLACEMENT;  Surgeon: Rexene Alberts, MD;  Location: [place];  Service: Open Heart Surgery;  Laterality: Right;  . Tee without cardioversion N/A 06/06/2013    Procedure: TRANSESOPHAGEAL ECHOCARDIOGRAM (TEE);  Surgeon: Larey Dresser, MD;  Location: [place];  Service: Cardiovascular;  Laterality: N/A;  . Cardioversion  N/A 06/06/2013    Procedure: CARDIOVERSION;  Surgeon: Larey Dresser, MD;  Location: [place];  Service: Cardiovascular;  Laterality: N/A;    ROS:  Review of systems complete and found to be negative unless listed above  PHYSICAL EXAM BP 125/55  Pulse 65  Ht 5\' 2"  (1.575 m)  Wt 186 lb (84.369 kg)  BMI 34.01 kg/m2 General: Well developed, well nourished, in no acute distress Head: Eyes PERRLA, No xanthomas.   Normal cephalic and atramatic  Lungs: Clear bilaterally to auscultation and percussion. With poor inspiratory effort. Heart: HRRR S1 S2, without MRG.  Pulses are 2+ & equal.            No carotid bruit. No JVD.  No abdominal bruits. No femoral bruits. Abdomen: Bowel sounds are positive, abdomen soft and non-tender without masses or                  Hernia's noted. Msk:  Back normal, normal gait. Normal strength and tone for age. Extremities: No clubbing, cyanosis or edema.  DP +1 Neuro: Alert and oriented X 3. Psych:  Good affect, responds appropriately  ASSESSMENT AND PLAN

## 2013-06-28 NOTE — Assessment & Plan Note (Signed)
Essentially the same. She is not very active. She uses a walker, and is also the wheelchair today. She continues with physical rehabilitation. There is some deconditioning elements to this.

## 2013-07-18 ENCOUNTER — Encounter (HOSPITAL_COMMUNITY): Payer: Self-pay

## 2013-07-18 ENCOUNTER — Ambulatory Visit (HOSPITAL_COMMUNITY)
Admission: RE | Admit: 2013-07-18 | Discharge: 2013-07-18 | Disposition: A | Discharge status: Home / Self Care | Payer: Medicare HMO | Source: Ambulatory Visit | Attending: Internal Medicine | Admitting: Internal Medicine

## 2013-07-18 ENCOUNTER — Telehealth (HOSPITAL_COMMUNITY): Payer: Self-pay | Admitting: Cardiology

## 2013-07-18 VITALS — BP 110/48 | HR 60 | Wt 182.8 lb

## 2013-07-18 DIAGNOSIS — Z9889 Other specified postprocedural states: Secondary | ICD-10-CM

## 2013-07-18 DIAGNOSIS — I5022 Chronic systolic (congestive) heart failure: Secondary | ICD-10-CM | POA: Insufficient documentation

## 2013-07-18 DIAGNOSIS — I4891 Unspecified atrial fibrillation: Secondary | ICD-10-CM | POA: Insufficient documentation

## 2013-07-18 DIAGNOSIS — Z954 Presence of other heart-valve replacement: Secondary | ICD-10-CM | POA: Insufficient documentation

## 2013-07-18 DIAGNOSIS — I480 Paroxysmal atrial fibrillation: Secondary | ICD-10-CM

## 2013-07-18 DIAGNOSIS — Z952 Presence of prosthetic heart valve: Secondary | ICD-10-CM

## 2013-07-18 DIAGNOSIS — Z8679 Personal history of other diseases of the circulatory system: Secondary | ICD-10-CM

## 2013-07-18 DIAGNOSIS — Z953 Presence of xenogenic heart valve: Secondary | ICD-10-CM

## 2013-07-18 LAB — COMPREHENSIVE METABOLIC PANEL
ALT: 12 U/L (ref 0–35)
AST: 20 U/L (ref 0–37)
Albumin: 3.1 g/dL — ABNORMAL LOW (ref 3.5–5.2)
Alkaline Phosphatase: 89 U/L (ref 39–117)
BUN: 40 mg/dL — ABNORMAL HIGH (ref 6–23)
CALCIUM: 10 mg/dL (ref 8.4–10.5)
CO2: 30 meq/L (ref 19–32)
CREATININE: 2.48 mg/dL — AB (ref 0.50–1.10)
Chloride: 93 mEq/L — ABNORMAL LOW (ref 96–112)
GFR calc non Af Amer: 18 mL/min — ABNORMAL LOW (ref >90)
GFR, EST AFRICAN AMERICAN: 21 mL/min — AB (ref >90)
Glucose, Bld: 157 mg/dL — ABNORMAL HIGH (ref 70–99)
Potassium: 4.5 mEq/L (ref 3.7–5.3)
Sodium: 135 mEq/L — ABNORMAL LOW (ref 137–147)
Total Bilirubin: 0.3 mg/dL (ref 0.3–1.2)
Total Protein: 7.3 g/dL (ref 6.0–8.3)

## 2013-07-18 LAB — TSH: TSH: 4.66 u[IU]/mL — AB (ref 0.350–4.500)

## 2013-07-18 LAB — PRO B NATRIURETIC PEPTIDE: Pro B Natriuretic peptide (BNP): 1514 pg/mL — ABNORMAL HIGH (ref 0–125)

## 2013-07-18 MED ORDER — CARVEDILOL 6.25 MG PO TABS: 6.2500 mg | ORAL_TABLET | Freq: Two times a day (BID) | ORAL | Status: DC

## 2013-07-18 NOTE — Patient Instructions (Signed)
PLEASE CALL us BACK AND LET us KNOW WHAT DOSE OF TORSEMIDE YOU ARE TAKING  Increase Carvedilol to 6.25 mg Twice daily  Labs today  Your physician recommends that you schedule a follow-up appointment in: 6 weeks

## 2013-07-18 NOTE — Telephone Encounter (Signed)
Pt daughter/grand daughter called to confirm dosage of torsemide Pt is current taking Torsemide 100 mg one tab daily

## 2013-07-18 NOTE — Progress Notes (Signed)
[name], female   DOB: 07-09-1940, 73 y.o.   MRN: [id number]  Primary Physician: Dr. Quintin Alto  Primary Cardiologist: Dr. Harl Bowie  Cardiac Surgeon: Dr Roxy Manns.   HPI: [name] is a 73 yo obese white female with known history of rheumatic fever during childhood and long standing history of chronic diastolic congestive heart failure. She also has a history of CKD, HTN , CVA 2003,, and PAF.  Progressed to severe symptomatic mitral regurgitation with paroxysmal atrial fibrillation and underwent minimally invasive Maze procedure with MV replacement (05/16/13). Was discharged 05/31/13 to [facility] center and weight was 207 lbs.  Pre-operative LHC in 2/15 showed no significant CAD.   Admitted to [facility] 06/04/13 with volume overload. Diuresed with lasix drip, Milrinone and metolazone. As volume improved she was transitioned to torsemide 80 mg dialy and spironolactone 25 mg daily. .She also had successful DC-CV 06/06/13 for A-flutter with RVR. Discharged on coumadin with INR followed by [facility] cardiology. Discharge weight 191 pounds. Discharged to [facility].   She returns for follow up. She is now back home living with family.  Weight is down 5 lbs.  She remains in NSR today.  She seems to be doing well.  She has gotten out to church.  No dyspnea walking in her house or out to the mailbox.  She uses a walker.  She is getting home PT.  No tachypalpitations.  Creatinine is up on labs today.   ECHO 06/05/13: EF 40-45%, RV mildly dilated and sys fx mildly reduced  Labs: 06/13/13 K 4.5 Creatinine 1.69 Labs: 06/18/13 K 4.4 Creatinine 1.79 Labs: 5/15 K 4.5, creatinine 2.48, LFTs normal, TSH 4.6 (elevated), BNP 1514 (lower)  SH: Lives in [place] with family. Never smoked. She does drink alcohol.   FH: Father and brother had MI  ROS: All systems negative except as listed in HPI, PMH and Problem List.  Past Medical History  Diagnosis Date  . Chronic airway obstruction, not elsewhere classified   .  Precordial pain   . Swelling of limb   . Dizziness and giddiness   . Unspecified transient cerebral ischemia     Diag. 2003  . Acute, but ill-defined, cerebrovascular disease     2000 [place]  . Shortness of breath   . Obesity   . Juvenile rheumatic fever     age 73  . Unspecified essential hypertension   . Hyperlipidemia   . Mitral regurgitation 02/16/2013  . Chronic kidney disease   . CHF (congestive heart failure) 04/23/2013  . Paroxysmal atrial fibrillation 02/16/2013    Recurrent paroxysmal atrial fibrillation, first diagnosed December 2014  . Chronic diastolic congestive heart failure   . Arthritis     Chronic left knee pain  . Aortic insufficiency due to bicuspid aortic valve     moderate by TEE  . 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 S/P Maze operation for atrial fibrillation 05/16/2013    Complete bilateral atrial lesion set using cryothermy and bipolar radiofrequency ablation with oversewing of LA appendage    Current Outpatient Prescriptions  Medication Sig Dispense Refill  . acetaminophen (TYLENOL) 325 MG tablet Take 650 mg by mouth every 6 (six) hours as needed for moderate pain.      Marland Kitchen amiodarone (PACERONE) 200 MG tablet Take 200 mg by mouth daily.      Marland Kitchen aspirin  EC 81 MG tablet Take 81 mg by mouth daily.      . benzonatate (TESSALON) 100 MG capsule Take 1 capsule (100 mg total) by mouth 2 (two) times daily as needed for cough.  20 capsule  0  . calcium-vitamin D (OSCAL WITH D) 500-200 MG-UNIT per tablet Take 1 tablet by mouth daily.      . carvedilol (COREG) 6.25 MG tablet Take 1 tablet (6.25 mg total) by mouth 2 (two) times daily with a meal.  60 tablet  3  . guaiFENesin (MUCINEX) 600 MG 12 hr tablet Take 1 tablet (600 mg total) by mouth every 12 (twelve) hours as needed for cough.      . iron polysaccharides  (NIFEREX) 150 MG capsule Take 1 capsule (150 mg total) by mouth daily. For one month then stop.      . Loperamide HCl (IMODIUM PO) Take 1 tablet by mouth daily as needed (for loose stools).       Marland Kitchen oxybutynin (DITROPAN-XL) 5 MG 24 hr tablet Take 5 mg by mouth at bedtime.      . potassium chloride SA (K-DUR,KLOR-CON) 20 MEQ tablet Take 1 tablet (20 mEq total) by mouth daily.  60 tablet  6  . simvastatin (ZOCOR) 10 MG tablet Take 1 tablet (10 mg total) by mouth daily at 6 PM.  30 tablet  11  . sodium chloride (OCEAN) 0.65 % SOLN nasal spray Place 1 spray into both nostrils as needed for congestion.      Marland Kitchen spironolactone (ALDACTONE) 25 MG tablet Take 1 tablet (25 mg total) by mouth daily.      Marland Kitchen torsemide (DEMADEX) 20 MG tablet Take 100 mg by mouth daily.      . traMADol (ULTRAM) 50 MG tablet Take 1 tablet (50 mg total) by mouth every 12 (twelve) hours as needed for moderate pain.  60 tablet  5  . warfarin (COUMADIN) 2 MG tablet 1 mg.       No current facility-administered medications for this encounter.     PHYSICAL EXAM: Filed Vitals:   07/18/13 1032  BP: 110/48  Pulse: 60  Weight: 182 lb 12.8 oz (82.918 kg)  SpO2: 95%    General:  NAD. No resp difficulty Sitting in wheelchair. Sister present. HEENT: normal Neck: supple. No JVD. Carotids 2+ bilaterally; no bruits. No lymphadenopathy or thryomegaly appreciated. Cor: PMI normal. Regular rate & rhythm. No rubs, gallops or murmurs. Lungs: Slight crackles at bases bilaterally.  Abdomen: soft, nontender, nondistended. No hepatosplenomegaly. No bruits or masses. Good bowel sounds. Extremities: no cyanosis, clubbing, rash, Trace ankle edema.   Neuro: alert & orientedx3, cranial nerves grossly intact. Moves all 4 extremities w/o difficulty. Affect pleasant.  ASSESSMENT & PLAN: 1. Chronic Systolic  Heart Failure with RV failure: EF 40-45% with RV dysfunction on echo in 3/15.  NYHA III but overall improved.  She does not look volume overloaded  on exam.  - Can increase Coreg to 6.25 mg bid - Creatinine higher on labs today.  Will decrease torsemide to 60 mg daily and repeat BMET in 1 week.  - Consider hydralazine/imdur at next visit. No ACEI due to CKD.  - Continue lower extremity compression hose.  2. Paroxysmal atrial fibrillation and flutter: S/p Maze with MV repair.  Had atypical atrial flutter subsequently with successful DC-CV 06/06/13.  - NSR today on amiodarone.  LFTs normal today but TSH mildly elevated.  Will need to repeat TSH with free T3 and T4 on next labs.  Needs yearly eye exam while on amiodarone.  - Continue coumadin.  3. CKD: As above, creatinine higher.  Will cut back on torsemide as she looks euvolemic.  4. Status post MV repair: Valve looked stable on post-op echo.   Larey Dresser 07/18/2013

## 2013-07-20 ENCOUNTER — Other Ambulatory Visit (HOSPITAL_COMMUNITY): Payer: Self-pay

## 2013-07-20 ENCOUNTER — Telehealth (HOSPITAL_COMMUNITY): Payer: Self-pay

## 2013-07-20 MED ORDER — TORSEMIDE 20 MG PO TABS: 60.0000 mg | ORAL_TABLET | Freq: Every day | ORAL | Status: DC

## 2013-07-20 NOTE — Addendum Note (Signed)
Encounter addended by: Georga Kaufmann, CCT on: 07/20/2013  8:52 AM<BR>     Documentation filed: Charges VN

## 2013-07-20 NOTE — Telephone Encounter (Signed)
Granddaughter made aware of lab results, instructed to decrease torsemide to 60mg  once daily, Rx sent to preferred pharmacy, labs to be rechecked by [facility] [place] next week.  Will call back to reschedule appointment in 3 wks vs. 6 wks. Renee Pain

## 2013-07-28 ENCOUNTER — Other Ambulatory Visit (HOSPITAL_BASED_OUTPATIENT_CLINIC_OR_DEPARTMENT_OTHER): Payer: Self-pay | Admitting: Internal Medicine

## 2013-08-13 ENCOUNTER — Encounter: Payer: Self-pay | Admitting: Cardiology

## 2013-08-13 ENCOUNTER — Ambulatory Visit (INDEPENDENT_AMBULATORY_CARE_PROVIDER_SITE_OTHER): Payer: Commercial Managed Care - HMO | Admitting: Cardiology

## 2013-08-13 VITALS — BP 107/54 | HR 51 | Ht 62.0 in | Wt 178.0 lb

## 2013-08-13 DIAGNOSIS — I5022 Chronic systolic (congestive) heart failure: Secondary | ICD-10-CM

## 2013-08-13 DIAGNOSIS — I4891 Unspecified atrial fibrillation: Secondary | ICD-10-CM

## 2013-08-13 DIAGNOSIS — Z954 Presence of other heart-valve replacement: Secondary | ICD-10-CM | POA: Diagnosis not present

## 2013-08-13 DIAGNOSIS — Z952 Presence of prosthetic heart valve: Secondary | ICD-10-CM

## 2013-08-13 NOTE — Progress Notes (Signed)
Clinical Summary [name] [name] is a 73 y.o.female seen today for follow up of the following medical problems.   1. Mitral regurgitation  - hx of rheumatic fever, developed severe MR - recent MVR (80mm [facility] Mitral bovine bioprosthetic tissue valve) 05/16/2013 with combined MAZE procedure - echo 06/05/13 with LVEF 40-45%, normal functioning MVR - TEE 06/06/13 prior to DCCV showed LVEF 45%, mild to mod MR, mod TR - discharged from [facility]  - denies any SOB. Can walk room to room with walker. Denies any orthopnea or PND. Mild LE edema. Cr 07/18/13 Cr was 2.48, up from 1.6 to 1.8 baseline.   2. Combined Systolic/ diastolic heart failure  - in weeks following MVR significant issues with volume overload requiring long term admission at [facility]. She was diruesed with lasix gtt, milrinone, and metolazone.  Discharge weight 191 lbs. Weighing herself at home, 177-180s.  - also followed by CHF clinic, last visit 07/18/13 with her weight noted to be down another 5 lbs. Her coreg was increased to 6.25mg  bid, her torsemide was decreased to 60mg  daily due to elevation in Cr. Denies any lightheadedness or dizziness.  - she is not on ACE-I due to CKD  3. Paroxysmal afib  - recent MAZE procedure during MVR 05/16/2013 - underwent DCCV 06/06/13 for aflutter, remains on amio.  - new diagnosis made during 02/2013 admission to [facility]  - started on metoprolol for rate control 25 mg bid, further titration limited by low normal blood pressures, reported good rate control on this regimen in the hospital  - started on xarelto for stroke anticoag  - denies any palpitations, denies any bleeding issues on coumadin      Past Medical History  Diagnosis Date  . Chronic airway obstruction, not elsewhere classified   . Precordial pain   . Swelling of limb   . Dizziness and giddiness   . Unspecified transient cerebral ischemia     Diag. 2003  . Acute, but ill-defined, cerebrovascular disease     2000 [place]   . Shortness of breath   . Obesity   . Juvenile rheumatic fever     age 9  . Unspecified essential hypertension   . Hyperlipidemia   . Mitral regurgitation 02/16/2013  . Chronic kidney disease   . CHF (congestive heart failure) 04/23/2013  . Paroxysmal atrial fibrillation 02/16/2013    Recurrent paroxysmal atrial fibrillation, first diagnosed December 2014  . Chronic diastolic congestive heart failure   . Arthritis     Chronic left knee pain  . Aortic insufficiency due to bicuspid aortic valve     moderate by TEE  . 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 S/P Maze operation for atrial fibrillation 05/16/2013    Complete bilateral atrial lesion set using cryothermy and bipolar radiofrequency ablation with oversewing of LA appendage     Allergies  Allergen Reactions  . Indomethacin Other (See Comments)    dizziness  . Norvasc [Amlodipine Besylate] Cough     Current Outpatient Prescriptions  Medication Sig Dispense Refill  . acetaminophen (TYLENOL) 325 MG tablet Take 650 mg by mouth every 6 (six) hours as needed for moderate pain.      Marland Kitchen amiodarone (PACERONE) 200 MG tablet Take 200 mg by mouth daily.      Marland Kitchen aspirin EC 81 MG tablet Take 81 mg by  mouth daily.      . benzonatate (TESSALON) 100 MG capsule Take 1 capsule (100 mg total) by mouth 2 (two) times daily as needed for cough.  20 capsule  0  . calcium-vitamin D (OSCAL WITH D) 500-200 MG-UNIT per tablet Take 1 tablet by mouth daily.      . carvedilol (COREG) 6.25 MG tablet Take 1 tablet (6.25 mg total) by mouth 2 (two) times daily with a meal.  60 tablet  3  . guaiFENesin (MUCINEX) 600 MG 12 hr tablet Take 1 tablet (600 mg total) by mouth every 12 (twelve) hours as needed for cough.      . iron polysaccharides (NIFEREX) 150 MG capsule Take 1 capsule (150 mg total) by mouth  daily. For one month then stop.      . Loperamide HCl (IMODIUM PO) Take 1 tablet by mouth daily as needed (for loose stools).       Marland Kitchen oxybutynin (DITROPAN-XL) 5 MG 24 hr tablet Take 5 mg by mouth at bedtime.      . potassium chloride SA (K-DUR,KLOR-CON) 20 MEQ tablet Take 1 tablet (20 mEq total) by mouth daily.  60 tablet  6  . simvastatin (ZOCOR) 10 MG tablet Take 1 tablet (10 mg total) by mouth daily at 6 PM.  30 tablet  11  . sodium chloride (OCEAN) 0.65 % SOLN nasal spray Place 1 spray into both nostrils as needed for congestion.      Marland Kitchen spironolactone (ALDACTONE) 25 MG tablet Take 1 tablet (25 mg total) by mouth daily.      Marland Kitchen torsemide (DEMADEX) 20 MG tablet Take 3 tablets (60 mg total) by mouth daily.  90 tablet  3  . traMADol (ULTRAM) 50 MG tablet Take 1 tablet (50 mg total) by mouth every 12 (twelve) hours as needed for moderate pain.  60 tablet  5  . warfarin (COUMADIN) 2 MG tablet 1 mg.       No current facility-administered medications for this visit.     Past Surgical History  Procedure Laterality Date  . Total knee arthroplasty Right   . Abdominal hysterectomy      Cervical Cancer  . Parathyroid/thyroid surgery      tumor  . Tee without cardioversion N/A 04/06/2013    Procedure: TRANSESOPHAGEAL ECHOCARDIOGRAM (TEE);  Surgeon: [name], MD;  [facility];  Service: Cardiology;  Laterality: N/A;  . Minimally invasive maze procedure N/A 05/16/2013    Procedure: MINIMALLY INVASIVE MAZE PROCEDURE;  Surgeon: Rexene Alberts, MD;  Location: [place];  Service: Open Heart Surgery;  Laterality: N/A;  . Intraoperative transesophageal echocardiogram N/A 05/16/2013    Procedure: INTRAOPERATIVE TRANSESOPHAGEAL ECHOCARDIOGRAM;  Surgeon: Rexene Alberts, MD;  Location: [place];  Service: Open Heart Surgery;  Laterality: N/A;  . Mitral valve replacement Right 05/16/2013    Procedure: MINIMALLY INVASIVE MITRAL VALVE (MV) REPLACEMENT;  Surgeon: Rexene Alberts, MD;  Location: [place];   Service: Open Heart Surgery;  Laterality: Right;  . Tee without cardioversion N/A 06/06/2013    Procedure: TRANSESOPHAGEAL ECHOCARDIOGRAM (TEE);  Surgeon: Larey Dresser, MD;  Location: [place];  Service: Cardiovascular;  Laterality: N/A;  . Cardioversion N/A 06/06/2013    Procedure: CARDIOVERSION;  Surgeon: Larey Dresser, MD;  Location: [place];  Service: Cardiovascular;  Laterality: N/A;     Allergies  Allergen Reactions  . Indomethacin Other (See Comments)    dizziness  . Norvasc [Amlodipine Besylate] Cough  Family History  Problem Relation Age of Onset  . Heart failure Father   . Heart attack Brother      Social History Ms. Leder reports that she has never smoked. She has never used smokeless tobacco. Ms. Friesenhahn reports that she does not drink alcohol.   Review of Systems CONSTITUTIONAL: No weight loss, fever, chills, weakness or fatigue.  HEENT: Eyes: No visual loss, blurred vision, double vision or yellow sclerae.No hearing loss, sneezing, congestion, runny nose or sore throat.  SKIN: No rash or itching.  CARDIOVASCULAR:per HPI  RESPIRATORY: No shortness of breath, cough or sputum.  GASTROINTESTINAL: No anorexia, nausea, vomiting or diarrhea. No abdominal pain or blood.  GENITOURINARY: No burning on urination, no polyuria NEUROLOGICAL: No headache, dizziness, syncope, paralysis, ataxia, numbness or tingling in the extremities. No change in bowel or bladder control.  MUSCULOSKELETAL: No muscle, back pain, joint pain or stiffness.  LYMPHATICS: No enlarged nodes. No history of splenectomy.  PSYCHIATRIC: No history of depression or anxiety.  ENDOCRINOLOGIC: No reports of sweating, cold or heat intolerance. No polyuria or polydipsia.  Marland Kitchen   Physical Examination p 51 bp 107/54 Wt 178 lbs BMI 33 Gen: resting comfortably, no acute distress HEENT: no scleral icterus, pupils equal round and reactive, no palptable cervical adenopathy,  CV:RRR, no m/r/g, no  JVD Resp: Clear to auscultation bilaterally GI: abdomen is soft, non-tender, non-distended, normal bowel sounds, no hepatosplenomegaly MSK: extremities are warm, no edema.  Skin: warm, no rash Neuro:  no focal deficits Psych: appropriate affect   Diagnostic Studies 06/05/13 Echo Procedure narrative: Transthoracic echocardiography. Poor endocardial definition. Intravenous contrast (Definity) was administered. - Left ventricle: The cavity size was normal. Wall thickness was normal. Systolic function was mildly to moderately reduced. The estimated ejection fraction was in the range of 40% to 45%. LV diastolic function cannot be assessed due to the prosthetic mitral valve. - Aortic valve: Poorly visualized. Mildly calcified leaflets. Transvalvular velocity was minimally increased. Mild regurgitation. - Mitral valve: Bioprosthetic mitral valve. Leaflets not well-visualized. Appears to be stable. Peak and mean gradients of 14 and 6 mmHg across the valve. Trivial regurgitation. Valve area by continuity equation (using LVOT flow): 1.33cm^2. - Right ventricle: The cavity size was mildly dilated. Systolic function is mildly reduced. - Right atrium: The atrium was normal in size. - Tricuspid valve: Mild regurgitation. - Pulmonary arteries: PA peak pressure: 43mm Hg (S). - Inferior vena cava: The vessel was dilated; the respirophasic diameter changes were blunted (< 50%); findings are consistent with elevated central venous pressure. - Pericardium, extracardiac: There was no pericardial effusion.   06/06/13 TEE Study Conclusions  - Left ventricle: The cavity size was normal. Wall thickness was increased in a pattern of mild LVH. The estimated ejection fraction was 45%. Diffuse hypokinesis. - Aortic valve: Bicuspid. There was no stenosis. Mild to moderate regurgitation. - Mitral valve: Bioprosthetic mitral valve with trivial regurgitation and no significant stenosis.  Pressure half-time: 73ms. - Left atrium: Ligated at prior surgery but there was still some flow into the appendage. No thrombus noted in appendage. The atrium was mildly dilated. - Right ventricle: The cavity size was normal. Systolic function was mildly reduced. - Right atrium: No evidence of thrombus in the atrial cavity or appendage. - Tricuspid valve: Moderate regurgitation. Peak RV-RA gradient: 64mm Hg (S). Impressions:  - May proceed to DCCV.     Assessment and Plan   1. Mitral regurgitation - s/p recent tissue valve replacement - clinically progressing, continue to follow  2.  Combined systolic/diastolic heart failure - agree with recent decrease in diuretic given up trending Cr, continue to follow  3. Parox afib - no current symptoms, continue current meds     [name], M.D., F.A.C.C.

## 2013-08-13 NOTE — Patient Instructions (Signed)
Continue all current medications. Follow up in  3-4 weeks

## 2013-08-28 ENCOUNTER — Ambulatory Visit (HOSPITAL_COMMUNITY)
Admission: RE | Admit: 2013-08-28 | Discharge: 2013-08-28 | Disposition: A | Discharge status: Home / Self Care | Payer: Medicare HMO | Source: Ambulatory Visit | Attending: Cardiology | Admitting: Cardiology

## 2013-08-28 VITALS — BP 94/54 | HR 48 | Wt 179.0 lb

## 2013-08-28 DIAGNOSIS — I519 Heart disease, unspecified: Secondary | ICD-10-CM | POA: Insufficient documentation

## 2013-08-28 DIAGNOSIS — Z09 Encounter for follow-up examination after completed treatment for conditions other than malignant neoplasm: Secondary | ICD-10-CM | POA: Diagnosis not present

## 2013-08-28 DIAGNOSIS — I129 Hypertensive chronic kidney disease with stage 1 through stage 4 chronic kidney disease, or unspecified chronic kidney disease: Secondary | ICD-10-CM | POA: Diagnosis not present

## 2013-08-28 DIAGNOSIS — Z7901 Long term (current) use of anticoagulants: Secondary | ICD-10-CM | POA: Diagnosis not present

## 2013-08-28 DIAGNOSIS — J449 Chronic obstructive pulmonary disease, unspecified: Secondary | ICD-10-CM | POA: Insufficient documentation

## 2013-08-28 DIAGNOSIS — Z8249 Family history of ischemic heart disease and other diseases of the circulatory system: Secondary | ICD-10-CM | POA: Insufficient documentation

## 2013-08-28 DIAGNOSIS — I4891 Unspecified atrial fibrillation: Secondary | ICD-10-CM | POA: Diagnosis not present

## 2013-08-28 DIAGNOSIS — J4489 Other specified chronic obstructive pulmonary disease: Secondary | ICD-10-CM | POA: Insufficient documentation

## 2013-08-28 DIAGNOSIS — I509 Heart failure, unspecified: Secondary | ICD-10-CM | POA: Diagnosis not present

## 2013-08-28 DIAGNOSIS — Z954 Presence of other heart-valve replacement: Secondary | ICD-10-CM | POA: Insufficient documentation

## 2013-08-28 DIAGNOSIS — N189 Chronic kidney disease, unspecified: Secondary | ICD-10-CM | POA: Diagnosis not present

## 2013-08-28 DIAGNOSIS — Z8673 Personal history of transient ischemic attack (TIA), and cerebral infarction without residual deficits: Secondary | ICD-10-CM | POA: Insufficient documentation

## 2013-08-28 DIAGNOSIS — Z79899 Other long term (current) drug therapy: Secondary | ICD-10-CM | POA: Diagnosis not present

## 2013-08-28 DIAGNOSIS — I4892 Unspecified atrial flutter: Secondary | ICD-10-CM | POA: Insufficient documentation

## 2013-08-28 DIAGNOSIS — Z7982 Long term (current) use of aspirin: Secondary | ICD-10-CM | POA: Diagnosis not present

## 2013-08-28 DIAGNOSIS — I5022 Chronic systolic (congestive) heart failure: Secondary | ICD-10-CM | POA: Diagnosis not present

## 2013-08-28 DIAGNOSIS — I480 Paroxysmal atrial fibrillation: Secondary | ICD-10-CM

## 2013-08-28 DIAGNOSIS — Z952 Presence of prosthetic heart valve: Secondary | ICD-10-CM

## 2013-08-28 LAB — BASIC METABOLIC PANEL
BUN: 59 mg/dL — AB (ref 6–23)
CALCIUM: 9.8 mg/dL (ref 8.4–10.5)
CO2: 30 mEq/L (ref 19–32)
CREATININE: 2.81 mg/dL — AB (ref 0.50–1.10)
Chloride: 95 mEq/L — ABNORMAL LOW (ref 96–112)
GFR calc Af Amer: 18 mL/min — ABNORMAL LOW (ref >90)
GFR, EST NON AFRICAN AMERICAN: 16 mL/min — AB (ref >90)
Glucose, Bld: 232 mg/dL — ABNORMAL HIGH (ref 70–99)
Potassium: 4.5 mEq/L (ref 3.7–5.3)
Sodium: 138 mEq/L (ref 137–147)

## 2013-08-28 LAB — T4, FREE: Free T4: 1.27 ng/dL (ref 0.80–1.80)

## 2013-08-28 LAB — PRO B NATRIURETIC PEPTIDE: Pro B Natriuretic peptide (BNP): 1518 pg/mL — ABNORMAL HIGH (ref 0–125)

## 2013-08-28 LAB — T3, FREE: T3, Free: 2.2 pg/mL — ABNORMAL LOW (ref 2.3–4.2)

## 2013-08-28 LAB — TSH: TSH: 4.57 u[IU]/mL — AB (ref 0.350–4.500)

## 2013-08-28 MED ORDER — CARVEDILOL 3.125 MG PO TABS: 3.1250 mg | ORAL_TABLET | Freq: Two times a day (BID) | ORAL | Status: DC

## 2013-08-28 NOTE — Patient Instructions (Signed)
STOP Metolazone CONTINUE Torsemide 60mg  daily DECREASE Carvedilol to 3.125mg  twice a day  Your physician recommends that you schedule a follow-up appointment in: 2 weeks with Labs (BMET)  Do the following things EVERYDAY: 1) Weigh yourself in the morning before breakfast. Write it down and keep it in a log. 2) Take your medicines as prescribed 3) Eat low salt foods-Limit salt (sodium) to 2000 mg per day.  4) Stay as active as you can everyday 5) Limit all fluids for the day to less than 2 liters 6)

## 2013-08-28 NOTE — Progress Notes (Signed)
[name], female   DOB: 04/10/1940, 73 y.o.   MRN: [id number]  Primary Physician: Dr. Quintin Alto  Primary Cardiologist: Dr. Harl Bowie  Cardiac Surgeon: Dr Roxy Manns.   HPI: [name] [name] is a 73 yo obese white female with known history of rheumatic fever during childhood and long standing history of chronic diastolic congestive heart failure. She also has a history of CKD, HTN , CVA 2003,, and PAF.  Progressed to severe symptomatic mitral regurgitation with paroxysmal atrial fibrillation and underwent minimally invasive Maze procedure with MV replacement (05/16/13). Was discharged 05/31/13 to [facility] center and weight was 207 lbs.  Pre-operative LHC in 2/15 showed no significant CAD.   Admitted to [facility] 06/04/13 with volume overload. Diuresed with lasix drip, Milrinone and metolazone. As volume improved she was transitioned to torsemide 80 mg daily and spironolactone 25 mg daily. She also had successful DC-CV 06/06/13 for A-flutter with RVR. Discharged on coumadin with INR followed by [facility] cardiology. Discharge weight 191 pounds. Discharged to [facility].   She returns for follow up. She is now back home living with family.  Weight is down another 3 lbs.  She remains in NSR today.  Symptomatically, she has been doing well.  She was able to walk a block this week.  She is short of breath after walking about 200 feet.  No tachypalpitations.  No orthopnea.  Recently, her renal function has been gradually worsening.  Most recently, creatinine was 2.67.  I have been unsure why this was going on and decreased her torsemide at last appointment to 60 mg daily.  She actually brought all her meds with her today, and it appears that she has been taking metolazone 2.5 mg daily for over a month in addition to her torsemide.  Apparently, this was started at her PCP's office.  BP and HR are both a bit low today.  Typically, she is not lightheaded.  However, she had an episode of lightheadedness with standing today.    ECHO 06/05/13: EF 40-45%, RV mildly dilated and sys fx mildly reduced  Labs: 06/13/13 K 4.5 Creatinine 1.69 Labs: 06/18/13 K 4.4 Creatinine 1.79 Labs: 5/15 K 4.5, creatinine 2.48, LFTs normal, TSH 4.6 (elevated), BNP 1514 (lower) Labs: 6/15 K 4.1, creatinien 2.67  SH: Lives in [place] with family. Never smoked. She does drink alcohol.   FH: Father and brother had MI  ROS: All systems negative except as listed in HPI, PMH and Problem List.  Past Medical History  Diagnosis Date  . Chronic airway obstruction, not elsewhere classified   . Precordial pain   . Swelling of limb   . Dizziness and giddiness   . Unspecified transient cerebral ischemia     Diag. 2003  . Acute, but ill-defined, cerebrovascular disease     2000 [place]  . Shortness of breath   . Obesity   . Juvenile rheumatic fever     age 70  . Unspecified essential hypertension   . Hyperlipidemia   . Mitral regurgitation 02/16/2013  . Chronic kidney disease   . CHF (congestive heart failure) 04/23/2013  . Paroxysmal atrial fibrillation 02/16/2013    Recurrent paroxysmal atrial fibrillation, first diagnosed December 2014  . Chronic diastolic congestive heart failure   . Arthritis     Chronic left knee pain  . Aortic insufficiency due to bicuspid aortic valve     moderate by TEE  . 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 S/P Maze operation for atrial fibrillation 05/16/2013    Complete bilateral atrial lesion set using cryothermy and bipolar radiofrequency ablation with oversewing of LA appendage    Current Outpatient Prescriptions  Medication Sig Dispense Refill  . amiodarone (PACERONE) 200 MG tablet Take 200 mg by mouth daily.      Marland Kitchen aspirin 81 MG tablet Take 81 mg by mouth daily.      . carvedilol (COREG) 3.125 MG tablet Take 1 tablet (3.125 mg total) by mouth 2  (two) times daily with a meal.  60 tablet  3  . oxybutynin (DITROPAN-XL) 5 MG 24 hr tablet Take 5 mg by mouth at bedtime.      . potassium chloride SA (K-DUR,KLOR-CON) 20 MEQ tablet Take 1 tablet (20 mEq total) by mouth daily.  60 tablet  6  . simvastatin (ZOCOR) 10 MG tablet Take 1 tablet (10 mg total) by mouth daily at 6 PM.  30 tablet  11  . spironolactone (ALDACTONE) 25 MG tablet Take 1 tablet (25 mg total) by mouth daily.      Marland Kitchen torsemide (DEMADEX) 20 MG tablet Take 3 tablets (60 mg total) by mouth daily.  90 tablet  3  . traMADol (ULTRAM) 50 MG tablet Take 1 tablet (50 mg total) by mouth every 12 (twelve) hours as needed for moderate pain.  60 tablet  5  . warfarin (COUMADIN) 2 MG tablet 1 mg.       No current facility-administered medications for this encounter.     PHYSICAL EXAM: Filed Vitals:   08/28/13 1340  BP: 94/54  Pulse: 48  Weight: 179 lb (81.194 kg)  SpO2: 98%    General:  NAD. No resp difficulty Sitting in wheelchair. Sister present. HEENT: normal Neck: supple. No JVD. Carotids 2+ bilaterally; no bruits. No lymphadenopathy or thryomegaly appreciated. Cor: PMI normal. Regular rate & rhythm. No rubs, gallops or murmurs. Lungs: Slight crackles at bases bilaterally.  Abdomen: soft, nontender, nondistended. No hepatosplenomegaly. No bruits or masses. Good bowel sounds. Extremities: no cyanosis, clubbing, rash, 1+ ankle edema.   Neuro: alert & orientedx3, cranial nerves grossly intact. Moves all 4 extremities w/o difficulty. Affect pleasant.  ASSESSMENT & PLAN: 1. Chronic systolic CHF with RV failure: EF 40-45% with RV dysfunction on echo in 3/15.  NYHA III but overall improved.  She does not look volume overloaded on exam.  - Given low HR and BP, I will decrease Coreg to 3.125 mg bid.  - She needs to stop metolazone with rising creatinine.  This is not a good medication to take every day.  I asked her to call this office in the future if changes are made to her diuretic  regimen at other offices. She will continue torsemide 60 mg daily.  I will check BMET/BNP today and repeat in 2 wks.  I will have her followup in 2 wks to reassess volume status.  - No ACEI due to elevated creatinine.  - Continue lower extremity compression hose.  2. Paroxysmal atrial fibrillation and flutter: S/p Maze with MV repair.  Had atypical atrial flutter subsequently with successful DC-CV 06/06/13.  - NSR today on amiodarone.  LFTs normal recently but TSH mildly elevated.  Will need to repeat TSH with free T3 and T4 done today. Needs yearly eye exam while on amiodarone.  - Continue coumadin.  3. CKD: As above, creatinine higher.  Will stop metolazone.  4.  Status post MV repair: Valve looked stable on post-op echo.   [name] 08/28/2013

## 2013-09-04 ENCOUNTER — Ambulatory Visit: Payer: Commercial Managed Care - HMO | Admitting: Cardiology

## 2013-09-04 ENCOUNTER — Telehealth: Payer: Self-pay | Admitting: *Deleted

## 2013-09-04 NOTE — Telephone Encounter (Signed)
Granddaughter informed that the appointment today wasn't necessary unless she was having problems. Per granddaughter, patient isn't having any issues. Appointment for this morning will be canceled.

## 2013-09-04 NOTE — Progress Notes (Unsigned)
Clinical Summary Ms. Teschner is a 73 y.o.female seen today for follow up of the following medical problems.   1. Mitral regurgitation  - hx of rheumatic fever, developed severe MR  - recent MVR (74mm [facility] Mitral bovine bioprosthetic tissue valve) 05/16/2013 with combined MAZE procedure  - echo 06/05/13 with LVEF 40-45%, normal functioning MVR  - TEE 06/06/13 prior to DCCV showed LVEF 45%, mild to mod MR, mod TR  - discharged from [facility]  - denies any SOB. Can walk room to room with walker. Denies any orthopnea or PND. Mild LE edema. Cr 07/18/13 Cr was 2.48, up from 1.6 to 1.8 baseline.   2. Combined Systolic/ diastolic heart failure  - in weeks following MVR significant issues with volume overload requiring long term admission at [facility]. She was diruesed with lasix gtt, milrinone, and metolazone. Discharge weight 191 lbs. Weighing herself at home, 177-180s.  - also followed by CHF clinic, last visit 07/18/13 with her weight noted to be down another 5 lbs. Her coreg was increased to 6.25mg  bid, her torsemide was decreased to 60mg  daily due to elevation in Cr. Denies any lightheadedness or dizziness.  - she is not on ACE-I due to CKD   3. Paroxysmal afib  - recent MAZE procedure during MVR 05/16/2013  - underwent DCCV 06/06/13 for aflutter, remains on amio.  - new diagnosis made during 02/2013 admission to [facility]  - started on metoprolol for rate control 25 mg bid, further titration limited by low normal blood pressures, reported good rate control on this regimen in the hospital  - started on xarelto for stroke anticoag  - denies any palpitations, denies any bleeding issues on coumadin    4. CKD   5. HTN     Past Medical History  Diagnosis Date  . Chronic airway obstruction, not elsewhere classified   . Precordial pain   . Swelling of limb   . Dizziness and giddiness   . Unspecified transient cerebral ischemia     Diag. 2003  . Acute, but ill-defined,  cerebrovascular disease     2000 [place]  . Shortness of breath   . Obesity   . Juvenile rheumatic fever     age 67  . Unspecified essential hypertension   . Hyperlipidemia   . Mitral regurgitation 02/16/2013  . Chronic kidney disease   . CHF (congestive heart failure) 04/23/2013  . Paroxysmal atrial fibrillation 02/16/2013    Recurrent paroxysmal atrial fibrillation, first diagnosed December 2014  . Chronic diastolic congestive heart failure   . Arthritis     Chronic left knee pain  . Aortic insufficiency due to bicuspid aortic valve     moderate by TEE  . 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 S/P Maze operation for atrial fibrillation 05/16/2013    Complete bilateral atrial lesion set using cryothermy and bipolar radiofrequency ablation with oversewing of LA appendage     Allergies  Allergen Reactions  . Indomethacin Other (See Comments)    dizziness  . Norvasc [Amlodipine Besylate] Cough     Current Outpatient Prescriptions  Medication Sig Dispense Refill  . amiodarone (PACERONE) 200 MG tablet Take 200 mg by mouth daily.      Marland Kitchen aspirin 81 MG tablet Take 81 mg by mouth daily.      . carvedilol (COREG) 3.125 MG tablet  Take 1 tablet (3.125 mg total) by mouth 2 (two) times daily with a meal.  60 tablet  3  . oxybutynin (DITROPAN-XL) 5 MG 24 hr tablet Take 5 mg by mouth at bedtime.      . potassium chloride SA (K-DUR,KLOR-CON) 20 MEQ tablet Take 1 tablet (20 mEq total) by mouth daily.  60 tablet  6  . simvastatin (ZOCOR) 10 MG tablet Take 1 tablet (10 mg total) by mouth daily at 6 PM.  30 tablet  11  . spironolactone (ALDACTONE) 25 MG tablet Take 1 tablet (25 mg total) by mouth daily.      Marland Kitchen torsemide (DEMADEX) 20 MG tablet Take 3 tablets (60 mg total) by mouth daily.  90 tablet  3  . traMADol (ULTRAM) 50 MG tablet Take 1  tablet (50 mg total) by mouth every 12 (twelve) hours as needed for moderate pain.  60 tablet  5  . warfarin (COUMADIN) 2 MG tablet 1 mg.       No current facility-administered medications for this visit.     Past Surgical History  Procedure Laterality Date  . Total knee arthroplasty Right   . Abdominal hysterectomy      Cervical Cancer  . Parathyroid/thyroid surgery      tumor  . Tee without cardioversion N/A 04/06/2013    Procedure: TRANSESOPHAGEAL ECHOCARDIOGRAM (TEE);  Surgeon: [name], MD;  [facility];  Service: Cardiology;  Laterality: N/A;  . Minimally invasive maze procedure N/A 05/16/2013    Procedure: MINIMALLY INVASIVE MAZE PROCEDURE;  Surgeon: Rexene Alberts, MD;  Location: [place];  Service: Open Heart Surgery;  Laterality: N/A;  . Intraoperative transesophageal echocardiogram N/A 05/16/2013    Procedure: INTRAOPERATIVE TRANSESOPHAGEAL ECHOCARDIOGRAM;  Surgeon: Rexene Alberts, MD;  Location: [place];  Service: Open Heart Surgery;  Laterality: N/A;  . Mitral valve replacement Right 05/16/2013    Procedure: MINIMALLY INVASIVE MITRAL VALVE (MV) REPLACEMENT;  Surgeon: Rexene Alberts, MD;  Location: [place];  Service: Open Heart Surgery;  Laterality: Right;  . Tee without cardioversion N/A 06/06/2013    Procedure: TRANSESOPHAGEAL ECHOCARDIOGRAM (TEE);  Surgeon: Larey Dresser, MD;  Location: [place];  Service: Cardiovascular;  Laterality: N/A;  . Cardioversion N/A 06/06/2013    Procedure: CARDIOVERSION;  Surgeon: Larey Dresser, MD;  Location: [place];  Service: Cardiovascular;  Laterality: N/A;     Allergies  Allergen Reactions  . Indomethacin Other (See Comments)    dizziness  . Norvasc [Amlodipine Besylate] Cough      Family History  Problem Relation Age of Onset  . Heart failure Father   . Heart attack Brother      Social History Ms. Naim reports that she has never smoked. She has never used smokeless tobacco. Ms. Gilkey reports  that she does not drink alcohol.   Review of Systems CONSTITUTIONAL: No weight loss, fever, chills, weakness or fatigue.  HEENT: Eyes: No visual loss, blurred vision, double vision or yellow sclerae.No hearing loss, sneezing, congestion, runny nose or sore throat.  SKIN: No rash or itching.  CARDIOVASCULAR:  RESPIRATORY: No shortness of breath, cough or sputum.  GASTROINTESTINAL: No anorexia, nausea, vomiting or diarrhea. No abdominal pain or blood.  GENITOURINARY: No burning on urination, no polyuria NEUROLOGICAL: No headache, dizziness, syncope, paralysis, ataxia, numbness or tingling in the extremities. No change in bowel or bladder control.  MUSCULOSKELETAL: No muscle, back pain, joint pain or stiffness.  LYMPHATICS: No enlarged nodes. No history of splenectomy.  PSYCHIATRIC: No history of depression or anxiety.  ENDOCRINOLOGIC: No reports of sweating, cold or heat intolerance. No polyuria or polydipsia.  Marland Kitchen   Physical Examination There were no vitals filed for this visit. There were no vitals filed for this visit.  Gen: resting comfortably, no acute distress HEENT: no scleral icterus, pupils equal round and reactive, no palptable cervical adenopathy,  CV Resp: Clear to auscultation bilaterally GI: abdomen is soft, non-tender, non-distended, normal bowel sounds, no hepatosplenomegaly MSK: extremities are warm, no edema.  Skin: warm, no rash Neuro:  no focal deficits Psych: appropriate affect   Diagnostic Studies 06/05/13 Echo  Procedure narrative: Transthoracic echocardiography. Poor endocardial definition. Intravenous contrast (Definity) was administered. - Left ventricle: The cavity size was normal. Wall thickness was normal. Systolic function was mildly to moderately reduced. The estimated ejection fraction was in the range of 40% to 45%. LV diastolic function cannot be assessed due to the prosthetic mitral valve. - Aortic valve: Poorly visualized. Mildly  calcified leaflets. Transvalvular velocity was minimally increased. Mild regurgitation. - Mitral valve: Bioprosthetic mitral valve. Leaflets not well-visualized. Appears to be stable. Peak and mean gradients of 14 and 6 mmHg across the valve. Trivial regurgitation. Valve area by continuity equation (using LVOT flow): 1.33cm^2. - Right ventricle: The cavity size was mildly dilated. Systolic function is mildly reduced. - Right atrium: The atrium was normal in size. - Tricuspid valve: Mild regurgitation. - Pulmonary arteries: PA peak pressure: 50mm Hg (S). - Inferior vena cava: The vessel was dilated; the respirophasic diameter changes were blunted (< 50%); findings are consistent with elevated central venous pressure. - Pericardium, extracardiac: There was no pericardial effusion.  06/06/13 TEE  Study Conclusions  - Left ventricle: The cavity size was normal. Wall thickness was increased in a pattern of mild LVH. The estimated ejection fraction was 45%. Diffuse hypokinesis. - Aortic valve: Bicuspid. There was no stenosis. Mild to moderate regurgitation. - Mitral valve: Bioprosthetic mitral valve with trivial regurgitation and no significant stenosis. Pressure half-time: 16ms. - Left atrium: Ligated at prior surgery but there was still some flow into the appendage. No thrombus noted in appendage. The atrium was mildly dilated. - Right ventricle: The cavity size was normal. Systolic function was mildly reduced. - Right atrium: No evidence of thrombus in the atrial cavity or appendage. - Tricuspid valve: Moderate regurgitation. Peak RV-RA gradient: 26mm Hg (S). Impressions:  - May proceed to DCCV.  - recently seen by Dr Marigene Ehlers 08/28/13. Noted she has been taking metolazone 2.5mg  daily along with her torsemide 60mg  daily. Her bp and heart rate were noted to be on the low side, coreg was decreased to 3.125mg  bid and metolazone was stopped    Assessment and Plan         [name], M.D., F.A.C.C.

## 2013-09-09 NOTE — Progress Notes (Signed)
[name], female   DOB: 11/04/1940, 73 y.o.   MRN: [id number]  Primary Physician: Dr. Quintin Alto  Primary Cardiologist: Dr. Harl Bowie  Cardiac Surgeon: Dr Roxy Manns.   HPI: Ms. Scharfenberg is a 73 yo obese white female with known history of rheumatic fever during childhood and long standing history of chronic diastolic congestive heart failure. She also has a history of CKD, HTN , CVA 2003,, and PAF.  Progressed to severe symptomatic mitral regurgitation with paroxysmal atrial fibrillation and underwent minimally invasive Maze procedure with MV replacement (05/16/13). Was discharged 05/31/13 to [facility] center and weight was 207 lbs.  Pre-operative LHC in 2/15 showed no significant CAD.   Admitted to [facility] 06/04/13 with volume overload. Diuresed with lasix drip, Milrinone and metolazone. As volume improved she was transitioned to torsemide 80 mg daily and spironolactone 25 mg daily. She also had successful DC-CV 06/06/13 for A-flutter with RVR. Discharged on coumadin with INR followed by [facility] cardiology. Discharge weight 191 pounds. Discharged to [facility].   She returns for follow up. Last visit carvedilol was cut back to 3.125 mg twice a day and metolazone was stopped due to elevated creatinine.  Overall she is feeling ok. Says last Sunday she lost her balance and had difficulty getting off the floor. Called 911 to help her get up. Denies SOB with ADLs. Does admit to fatgiue after she completes ADLs.  Weight at home 174-178. Taking all medications. Living at home. Has family in and out every day. Using rolling walker. Granddaughter prepared medications.   Labs: 06/13/13 K 4.5 Creatinine 1.69 Labs: 06/18/13 K 4.4 Creatinine 1.79 Labs: 5/15 K 4.5, creatinine 2.48, LFTs normal, TSH 4.6 (elevated), BNP 1514 (lower) Labs 08/28/13 K 4.5 Creatinine 2.8   SH: Lives in [place] with family. Never smoked. She does drink alcohol.   FH: Father and brother had MI  ROS: All systems negative except as listed in  HPI, PMH and Problem List.  Past Medical History  Diagnosis Date  . Chronic airway obstruction, not elsewhere classified   . Precordial pain   . Swelling of limb   . Dizziness and giddiness   . Unspecified transient cerebral ischemia     Diag. 2003  . Acute, but ill-defined, cerebrovascular disease     20 00 [facility]  . Shortness of breath   . Obesity   . Juvenile rheumatic fever     age 57  . Unspecified essential hypertension   . Hyperlipidemia   . Mitral regurgitation 02/16/2013  . Chronic kidney disease   . CHF (congestive heart failure) 04/23/2013  . Paroxysmal atrial fibrillation 02/16/2013    Recurrent paroxysmal atrial fibrillation, first diagnosed December 2014  . Chronic diastolic congestive heart failure   . Arthritis     Chronic left knee pain  . Aortic insufficiency due to bicuspid aortic valve     moderate by TEE  . 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 S/P Maze operation for atrial fibrillation 05/16/2013    Complete bilateral atrial lesion set using cryothermy and bipolar radiofrequency ablation with oversewing of LA appendage    Current Outpatient Prescriptions  Medication Sig Dispense Refill  . amiodarone (PACERONE) 200 MG tablet Take 200 mg by mouth daily.      Marland Kitchen aspirin 81 MG tablet Take 81 mg by mouth daily.      Marland Kitchen  carvedilol (COREG) 3.125 MG tablet Take 1 tablet (3.125 mg total) by mouth 2 (two) times daily with a meal.  60 tablet  3  . oxybutynin (DITROPAN-XL) 5 MG 24 hr tablet Take 5 mg by mouth at bedtime.      . potassium chloride SA (K-DUR,KLOR-CON) 20 MEQ tablet Take 1 tablet (20 mEq total) by mouth daily.  60 tablet  6  . simvastatin (ZOCOR) 10 MG tablet Take 1 tablet (10 mg total) by mouth daily at 6 PM.  30 tablet  11  . spironolactone (ALDACTONE) 25 MG tablet Take 1 tablet (25 mg total) by  mouth daily.      Marland Kitchen torsemide (DEMADEX) 20 MG tablet Take 3 tablets (60 mg total) by mouth daily.  90 tablet  3  . traMADol (ULTRAM) 50 MG tablet Take 1 tablet (50 mg total) by mouth every 12 (twelve) hours as needed for moderate pain.  60 tablet  5  . warfarin (COUMADIN) 2 MG tablet 1 mg.       No current facility-administered medications for this encounter.     PHYSICAL EXAM: Filed Vitals:   09/11/13 1059  BP: 121/52  Pulse: 50  Resp: 18  Weight: 180 lb 6 oz (81.818 kg)  SpO2: 94%    General:  NAD. No resp difficulty Sitting on rolling walker. Granddaughter present.  HEENT: normal Neck: supple. No JVD. Carotids 2+ bilaterally; no bruits. No lymphadenopathy or thryomegaly appreciated. Cor: PMI normal. Regular rate & rhythm. No rubs, gallops or murmurs. Lungs: Slight crackles at bases bilaterally.  Abdomen: soft, nontender, nondistended. No hepatosplenomegaly. No bruits or masses. Good bowel sounds. Extremities: no cyanosis, clubbing, rash, 1+ ankle edema.   Neuro: alert & orientedx3, cranial nerves grossly intact. Moves all 4 extremities w/o difficulty. Affect pleasant.  ASSESSMENT & PLAN: 1. Chronic systolic CHF with RV failure: EF 40-45% with RV dysfunction on echo in 3/15.  NYHA II- III but overall improved.  Weight down 11 pounds from discharge. Volume status ok with worsening renal function. She has been off metolazone but last creatinine 2.8 at that end of June. For now will hold torsemide and potassium for 2 days.  Will likely need to cut back torsemide.  Continue Coreg to 3.125 mg bid will not increase due to low HR.   -Check BMET now.   - No ACEI due to elevated creatinine.  - Continue lower extremity compression hose.  2. PAF mal atrial fibrillation and flutter: S/p Maze with MV repair.  Had atypical atrial flutter subsequently with successful DC-CV 06/06/13.  - NSR today on amiodarone.  LFTs normal recently but TSH mildly elevated.  Will need to repeat TSH with free T3  and T4 done today. Needs yearly eye exam while on amiodarone.  - Continue coumadin.  3. CKD: As above, creatinine higher.  Hold diuretics as noted above  Follow up in 3 weeks.   Marland Kitchen CLEGG,AMY NP-C  09/11/2013

## 2013-09-11 ENCOUNTER — Encounter (HOSPITAL_COMMUNITY): Payer: Self-pay

## 2013-09-11 ENCOUNTER — Ambulatory Visit (HOSPITAL_COMMUNITY)
Admission: RE | Admit: 2013-09-11 | Discharge: 2013-09-11 | Disposition: A | Discharge status: Home / Self Care | Payer: Medicare HMO | Source: Ambulatory Visit | Attending: Internal Medicine | Admitting: Internal Medicine

## 2013-09-11 VITALS — BP 121/52 | HR 50 | Resp 18 | Wt 180.4 lb

## 2013-09-11 DIAGNOSIS — E785 Hyperlipidemia, unspecified: Secondary | ICD-10-CM | POA: Diagnosis not present

## 2013-09-11 DIAGNOSIS — N183 Chronic kidney disease, stage 3 unspecified: Secondary | ICD-10-CM

## 2013-09-11 DIAGNOSIS — M1710 Unilateral primary osteoarthritis, unspecified knee: Secondary | ICD-10-CM | POA: Insufficient documentation

## 2013-09-11 DIAGNOSIS — Z8673 Personal history of transient ischemic attack (TIA), and cerebral infarction without residual deficits: Secondary | ICD-10-CM | POA: Insufficient documentation

## 2013-09-11 DIAGNOSIS — I5042 Chronic combined systolic (congestive) and diastolic (congestive) heart failure: Secondary | ICD-10-CM | POA: Insufficient documentation

## 2013-09-11 DIAGNOSIS — Z954 Presence of other heart-valve replacement: Secondary | ICD-10-CM | POA: Insufficient documentation

## 2013-09-11 DIAGNOSIS — I359 Nonrheumatic aortic valve disorder, unspecified: Secondary | ICD-10-CM | POA: Insufficient documentation

## 2013-09-11 DIAGNOSIS — Z7982 Long term (current) use of aspirin: Secondary | ICD-10-CM | POA: Insufficient documentation

## 2013-09-11 DIAGNOSIS — I480 Paroxysmal atrial fibrillation: Secondary | ICD-10-CM

## 2013-09-11 DIAGNOSIS — I5022 Chronic systolic (congestive) heart failure: Secondary | ICD-10-CM

## 2013-09-11 DIAGNOSIS — J449 Chronic obstructive pulmonary disease, unspecified: Secondary | ICD-10-CM | POA: Insufficient documentation

## 2013-09-11 DIAGNOSIS — I129 Hypertensive chronic kidney disease with stage 1 through stage 4 chronic kidney disease, or unspecified chronic kidney disease: Secondary | ICD-10-CM | POA: Diagnosis not present

## 2013-09-11 DIAGNOSIS — Z7901 Long term (current) use of anticoagulants: Secondary | ICD-10-CM | POA: Insufficient documentation

## 2013-09-11 DIAGNOSIS — N189 Chronic kidney disease, unspecified: Secondary | ICD-10-CM | POA: Insufficient documentation

## 2013-09-11 DIAGNOSIS — R072 Precordial pain: Secondary | ICD-10-CM | POA: Insufficient documentation

## 2013-09-11 DIAGNOSIS — I4891 Unspecified atrial fibrillation: Secondary | ICD-10-CM | POA: Diagnosis not present

## 2013-09-11 DIAGNOSIS — I4892 Unspecified atrial flutter: Secondary | ICD-10-CM | POA: Diagnosis not present

## 2013-09-11 DIAGNOSIS — I509 Heart failure, unspecified: Secondary | ICD-10-CM | POA: Diagnosis not present

## 2013-09-11 DIAGNOSIS — E669 Obesity, unspecified: Secondary | ICD-10-CM | POA: Diagnosis not present

## 2013-09-11 DIAGNOSIS — IMO0002 Reserved for concepts with insufficient information to code with codable children: Secondary | ICD-10-CM

## 2013-09-11 DIAGNOSIS — J4489 Other specified chronic obstructive pulmonary disease: Secondary | ICD-10-CM | POA: Insufficient documentation

## 2013-09-11 LAB — BASIC METABOLIC PANEL
Anion gap: 15 (ref 5–15)
BUN: 41 mg/dL — AB (ref 6–23)
CO2: 25 mEq/L (ref 19–32)
CREATININE: 2.38 mg/dL — AB (ref 0.50–1.10)
Calcium: 9.8 mg/dL (ref 8.4–10.5)
Chloride: 98 mEq/L (ref 96–112)
GFR calc Af Amer: 22 mL/min — ABNORMAL LOW (ref >90)
GFR, EST NON AFRICAN AMERICAN: 19 mL/min — AB (ref >90)
GLUCOSE: 89 mg/dL (ref 70–99)
Potassium: 4.5 mEq/L (ref 3.7–5.3)
SODIUM: 138 meq/L (ref 137–147)

## 2013-09-11 NOTE — Patient Instructions (Signed)
Follow up in 3 weeks   Hold torsemide for 2 days   Hold Potassium for 2 days   Do the following things EVERYDAY: 1) Weigh yourself in the morning before breakfast. Write it down and keep it in a log. 2) Take your medicines as prescribed 3) Eat low salt foods-Limit salt (sodium) to 2000 mg per day.  4) Stay as active as you can everyday 5) Limit all fluids for the day to less than 2 liters

## 2013-09-12 ENCOUNTER — Other Ambulatory Visit (HOSPITAL_COMMUNITY): Payer: Self-pay

## 2013-09-12 MED ORDER — TORSEMIDE 20 MG PO TABS
40.0000 mg | ORAL_TABLET | Freq: Every day | ORAL | Status: DC
Start: 2013-09-12 — End: 2013-10-03

## 2013-09-27 ENCOUNTER — Telehealth (HOSPITAL_COMMUNITY): Payer: Self-pay | Admitting: Vascular Surgery

## 2013-09-27 NOTE — Telephone Encounter (Signed)
8lb weight gain in last 4 weeks... Some swelling in lower extremity... Pt not systematic.Marland Kitchen No crackling in the lungs.Marland Kitchen

## 2013-09-27 NOTE — Telephone Encounter (Signed)
Spoke w/Julie, she hasn't seen pt in about 4 weeks but wt is up 8 lbs since that time but doesn't wt daily so unsure if has been gradual over the 4 weeks or has just started jumping up, she states no more edema than usual, no SOB lungs sound good, pt is sch to see [place] on Tue advised to continue current med dose at this time, keep appt Tue and c/b before if pt dev symptoms she is agreeable and will let pt know

## 2013-10-02 ENCOUNTER — Ambulatory Visit (HOSPITAL_BASED_OUTPATIENT_CLINIC_OR_DEPARTMENT_OTHER)
Admission: RE | Admit: 2013-10-02 | Discharge: 2013-10-02 | Disposition: A | Discharge status: Home / Self Care | Payer: Medicare HMO | Source: Ambulatory Visit | Attending: Internal Medicine | Admitting: Internal Medicine

## 2013-10-02 ENCOUNTER — Encounter (HOSPITAL_COMMUNITY): Payer: Self-pay

## 2013-10-02 VITALS — BP 100/52 | HR 50 | Resp 18 | Wt 191.1 lb

## 2013-10-02 DIAGNOSIS — I509 Heart failure, unspecified: Secondary | ICD-10-CM

## 2013-10-02 DIAGNOSIS — N183 Chronic kidney disease, stage 3 unspecified: Secondary | ICD-10-CM | POA: Insufficient documentation

## 2013-10-02 DIAGNOSIS — Z7901 Long term (current) use of anticoagulants: Secondary | ICD-10-CM

## 2013-10-02 DIAGNOSIS — E785 Hyperlipidemia, unspecified: Secondary | ICD-10-CM

## 2013-10-02 DIAGNOSIS — Z8249 Family history of ischemic heart disease and other diseases of the circulatory system: Secondary | ICD-10-CM | POA: Insufficient documentation

## 2013-10-02 DIAGNOSIS — I10 Essential (primary) hypertension: Secondary | ICD-10-CM

## 2013-10-02 DIAGNOSIS — I4892 Unspecified atrial flutter: Secondary | ICD-10-CM

## 2013-10-02 DIAGNOSIS — I5022 Chronic systolic (congestive) heart failure: Secondary | ICD-10-CM

## 2013-10-02 DIAGNOSIS — I129 Hypertensive chronic kidney disease with stage 1 through stage 4 chronic kidney disease, or unspecified chronic kidney disease: Secondary | ICD-10-CM | POA: Insufficient documentation

## 2013-10-02 DIAGNOSIS — I5042 Chronic combined systolic (congestive) and diastolic (congestive) heart failure: Secondary | ICD-10-CM | POA: Insufficient documentation

## 2013-10-02 DIAGNOSIS — I059 Rheumatic mitral valve disease, unspecified: Secondary | ICD-10-CM | POA: Insufficient documentation

## 2013-10-02 DIAGNOSIS — J449 Chronic obstructive pulmonary disease, unspecified: Secondary | ICD-10-CM | POA: Insufficient documentation

## 2013-10-02 DIAGNOSIS — J4489 Other specified chronic obstructive pulmonary disease: Secondary | ICD-10-CM | POA: Insufficient documentation

## 2013-10-02 DIAGNOSIS — Z954 Presence of other heart-valve replacement: Secondary | ICD-10-CM

## 2013-10-02 DIAGNOSIS — Z7982 Long term (current) use of aspirin: Secondary | ICD-10-CM | POA: Insufficient documentation

## 2013-10-02 DIAGNOSIS — I4891 Unspecified atrial fibrillation: Secondary | ICD-10-CM | POA: Insufficient documentation

## 2013-10-02 DIAGNOSIS — Z8673 Personal history of transient ischemic attack (TIA), and cerebral infarction without residual deficits: Secondary | ICD-10-CM

## 2013-10-02 LAB — BASIC METABOLIC PANEL
Anion gap: 11 (ref 5–15)
BUN: 41 mg/dL — AB (ref 6–23)
CALCIUM: 9.2 mg/dL (ref 8.4–10.5)
CO2: 26 meq/L (ref 19–32)
Chloride: 98 mEq/L (ref 96–112)
Creatinine, Ser: 3.29 mg/dL — ABNORMAL HIGH (ref 0.50–1.10)
GFR calc Af Amer: 15 mL/min — ABNORMAL LOW (ref >90)
GFR calc non Af Amer: 13 mL/min — ABNORMAL LOW (ref >90)
Glucose, Bld: 101 mg/dL — ABNORMAL HIGH (ref 70–99)
Potassium: 5.5 mEq/L — ABNORMAL HIGH (ref 3.7–5.3)
SODIUM: 135 meq/L — AB (ref 137–147)

## 2013-10-02 LAB — PRO B NATRIURETIC PEPTIDE: PRO B NATRI PEPTIDE: 2909 pg/mL — AB (ref 0–125)

## 2013-10-02 MED ORDER — TELMISARTAN 40 MG PO TABS: 20.0000 mg | ORAL_TABLET | Freq: Every day | ORAL | Status: DC

## 2013-10-02 NOTE — H&P (Signed)
Advanced Heart Failure Team History and Physical Note   Primary Physician: Dr. Quintin Alto Primary Cardiologist:  Dr. Harl Bowie  Reason for Admission: A/C HF with possible low output   HPI:    [name] [name] is a 73 yo obese white female with known history of rheumatic fever during childhood and long standing history of chronic diastolic congestive heart failure. She also has a history of CKD, HTN , CVA 2003,, and PAF. 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 Was discharged 05/31/13 to [facility] center and weight was 207 lbs. Pre-operative LHC in 2/15 showed no significant CAD.   Presented to the clinic yesterday with fatigue and slight increase in SOB. She saw PCP in the past couple of days who was concerned for volume overload and increased her torsemide to 60 mg daily. Yesterday EKG in clinic was SB with 1 degree AV block and occassional PAC 45 bpm. Her Amiodarone and coreg were discontinued. After clinic labs resulted and creatinine was 3.29, K+ 5.5, BUN 41 and pro-BNP 2909. Planned admission for concerns of low output and A/C HF.   Review of Systems: [y] = yes, [ ]  = no   General: Weight gain [Y ]; Weight loss [ ] ; Anorexia [ ] ; Fatigue [ Y]; Fever [ ] ; Chills [ ] ; Weakness Y[ ]   Cardiac: Chest pain/pressure [ ] ; Resting SOB [ ] ; Exertional SOB [Y ]; Orthopnea [ ] ; Pedal Edema [Y ]; Palpitations Aqua.Slicker ]; Syncope [ ] ; Presyncope [ ] ; Paroxysmal nocturnal dyspnea[ ]   Pulmonary: Cough [ ] ; Wheezing[ ] ; Hemoptysis[ ] ; Sputum [ ] ; Snoring [ ]   GI: Vomiting[ ] ; Dysphagia[ ] ; Melena[ ] ; Hematochezia [ ] ; Heartburn[ ] ; Abdominal pain [ ] ; Constipation [ ] ; Diarrhea [ ] ; BRBPR [ ]   GU: Hematuria[ ] ; Dysuria [ ] ; Nocturia[ ]   Vascular: Pain in legs with walking [ ] ; Pain in feet with lying flat [ ] ; Non-healing sores [ ] ; Stroke [ ] ; TIA [ ] ; Slurred speech [ ] ;  Neuro:  Headaches[ ] ; Vertigo[ ] ; Seizures[ ] ; Paresthesias[ ] ;Blurred vision [ ] ; Diplopia [ ] ; Vision changes [ ]   Ortho/Skin: Arthritis [ ] ; Joint pain [ Y]; Muscle pain [ ] ; Joint swelling [ ] ; Back Pain [ ] ; Rash [ ]   Psych: Depression[ ] ; Anxiety[ ]   Heme: Bleeding problems [ ] ; Clotting disorders [ ] ; Anemia [ ]   Endocrine: Diabetes [ ] ; Thyroid dysfunction[ ]   Home Medications Prior to Admission medications   Medication Sig Start Date End Date Taking? Authorizing Provider  aspirin 81 MG tablet Take 81 mg by mouth daily.    Historical Provider, MD  oxybutynin (DITROPAN-XL) 5 MG 24 hr tablet Take 5 mg by mouth at bedtime.    Historical Provider, MD  potassium chloride SA (K-DUR,KLOR-CON) 20 MEQ tablet Take 1 tablet (20 mEq total) by mouth daily. 05/30/13   Donielle Liston Alba, PA-C  simvastatin (ZOCOR) 10 MG tablet Take 1 tablet (10 mg total) by mouth daily at 6 PM. 06/13/13   Evelene Croon Barrett, PA-C  spironolactone (ALDACTONE) 25 MG tablet Take 1 tablet (25 mg total) by mouth daily. 05/30/13   Donielle Liston Alba, PA-C  torsemide (DEMADEX) 20 MG tablet Take 2 tablets (40 mg total) by mouth daily. 09/12/13   Jolaine Artist, MD  traMADol (ULTRAM) 50 MG tablet Take 1 tablet (50 mg total) by mouth every 12 (twelve) hours as needed for moderate pain. 06/14/13   Tiffany  L Reed, DO  warfarin (COUMADIN) 2 MG tablet 1 mg. 06/13/13   Lonn Georgia, PA-C    Past Medical History: Past Medical History  Diagnosis Date  . Chronic airway obstruction, not elsewhere classified   . Precordial pain   . Swelling of limb   . Dizziness and giddiness   . Unspecified transient cerebral ischemia     Diag. 2003  . Acute, but ill-defined, cerebrovascular disease     2000 [place]  . Shortness of breath   . Obesity   . Juvenile rheumatic fever     age 48  . Unspecified essential hypertension   . Hyperlipidemia   . Mitral regurgitation 02/16/2013  . Chronic kidney disease   . CHF (congestive heart failure) 04/23/2013   . Paroxysmal atrial fibrillation 02/16/2013    Recurrent paroxysmal atrial fibrillation, first diagnosed December 2014  . Chronic diastolic congestive heart failure   . Arthritis     Chronic left knee pain  . Aortic insufficiency due to bicuspid aortic valve     moderate by TEE  . 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 S/P Maze operation for atrial fibrillation 05/16/2013    Complete bilateral atrial lesion set using cryothermy and bipolar radiofrequency ablation with oversewing of LA appendage    Past Surgical History: Past Surgical History  Procedure Laterality Date  . Total knee arthroplasty Right   . Abdominal hysterectomy      Cervical Cancer  . Parathyroid/thyroid surgery      tumor  . Tee without cardioversion N/A 04/06/2013    Procedure: TRANSESOPHAGEAL ECHOCARDIOGRAM (TEE);  Surgeon: [name], MD;  [facility];  Service: Cardiology;  Laterality: N/A;  . Minimally invasive maze procedure N/A 05/16/2013    Procedure: MINIMALLY INVASIVE MAZE PROCEDURE;  Surgeon: Rexene Alberts, MD;  Location: [place];  Service: Open Heart Surgery;  Laterality: N/A;  . Intraoperative transesophageal echocardiogram N/A 05/16/2013    Procedure: INTRAOPERATIVE TRANSESOPHAGEAL ECHOCARDIOGRAM;  Surgeon: Rexene Alberts, MD;  Location: [place];  Service: Open Heart Surgery;  Laterality: N/A;  . Mitral valve replacement Right 05/16/2013    Procedure: MINIMALLY INVASIVE MITRAL VALVE (MV) REPLACEMENT;  Surgeon: Rexene Alberts, MD;  Location: [place];  Service: Open Heart Surgery;  Laterality: Right;  . Tee without cardioversion N/A 06/06/2013    Procedure: TRANSESOPHAGEAL ECHOCARDIOGRAM (TEE);  Surgeon: Larey Dresser, MD;  Location: [place];  Service: Cardiovascular;  Laterality: N/A;  . Cardioversion N/A 06/06/2013    Procedure:  CARDIOVERSION;  Surgeon: Larey Dresser, MD;  Location: [facility];  Service: Cardiovascular;  Laterality: N/A;    Family History: Family History  Problem Relation Age of Onset  . Heart failure Father   . Heart attack Brother     Social History: History   Social History  . Marital Status: Widowed    Spouse Name: N/A    Number of Children: N/A  . Years of Education: N/A   Social History Main Topics  . Smoking status: Never Smoker   . Smokeless tobacco: Never Used  . Alcohol Use: No  . Drug Use: No  . Sexual Activity: Not on file   Other Topics Concern  . 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 Indomethacin Other (See Comments)    dizziness  . Norvasc [Amlodipine Besylate] Cough    Objective:    Vital Signs:   Pulse Rate:  [50] 50 (07/28 1113) Resp:  [18] 18 (07/28 1113) BP: (100)/(52) 100/52 mmHg (07/28 1113) SpO2:  [91 %] 91 % (07/28 1113) Weight:  [191 lb 2 oz (86.694 kg)] 191 lb 2 oz (86.694 kg) (07/28 1113)   There were no vitals filed for this visit.  Physical Exam: General: NAD. No resp difficulty Sitting on rolling walker. Granddaughter present.  HEENT: normal  Neck: supple. JVD difficult to assess d/t body habitus but appears 8. Carotids 2+ bilaterally; no bruits. No lymphadenopathy or thryomegaly appreciated.  Cor: PMI normal. Irregular rate & rhythm. No rubs, gallops or murmurs.  Lungs: Slight crackles at bases bilaterally.  Abdomen: soft, nontender, nondistended. No hepatosplenomegaly. No bruits or masses. Good bowel sounds.  Extremities: no cyanosis, clubbing, rash, 1+ ankle edema.  Neuro: alert & orientedx3, cranial nerves grossly intact. Moves all 4 extremities w/o difficulty. Affect pleasant.  Telemetry: SB 50s  Labs: Basic Metabolic Panel:  Recent Labs Lab 10/02/13 1134  NA 135*  K  5.5*  CL 98  CO2 26  GLUCOSE 101*  BUN 41*  CREATININE 3.29*  CALCIUM 9.2    Liver Function Tests: No results found for this basename: AST, ALT, ALKPHOS, BILITOT, PROT, ALBUMIN,  in the last 168 hours No results found for this basename: LIPASE, AMYLASE,  in the last 168 hours No results found for this basename: AMMONIA,  in the last 168 hours  CBC: No results found for this basename: WBC, NEUTROABS, HGB, HCT, MCV, PLT,  in the last 168 hours  Cardiac Enzymes: No results found for this basename: CKTOTAL, CKMB, CKMBINDEX, TROPONINI,  in the last 168 hours  BNP: BNP (last 3 results)  Recent Labs  07/18/13 1107 08/28/13 1418 10/02/13 1134  PROBNP 1514.0* 1518.0* 2909.0*    CBG: No results found for this basename: GLUCAP,  in the last 168 hours  Coagulation Studies: No results found for this basename: LABPROT, INR,  in the last 72 hours  Other results: EKG:   Imaging:  No results found.      Assessment:   1) AKI  2) Cardiogenic shock - suspect low output 3) severe MR 4) PAF - minimally invasive Maze procedure with MV replacement (05/16/13) 5) Acute RV systolic failure  Plan/Discussion:    [name] [name] is well known to the HF team and was seen in clinic yesterday. She has been having increased fatigue and over the past week has noticed increase in SOB. Labs resulted from clinic with creatinine continuing to trend up. Concerned for low-output. She has needed milrinone in the past.   Will admit and place central line for co-oxs and CVPs. Start milrinone 0.25 mcg. Hold diuretics currently. No BB with acute decompensation. No ACE-I or Arlyce Harman with CKD.  Will consult palliative care for goals of care. Patient discussed yesterday at clinic visit that she does not want to undergo any more surgeries.   Coumadin per pharmacy.  Consult PT.   Length of Stay:  Rande Brunt NP-C 10/02/2013, 4:26 PM  Advanced Heart Failure Team Pager [phone] (M-F; 7a - 4p)   Please contact [facility] for night-coverage after hours (4p -7a ) and weekends on [email or web address]  Patient seen and examined with Junie Bame, NP. We  discussed all aspects of the encounter. I agree with the assessment and plan as stated above.   She has progressive HF and renal failure despite what appears to be volume overload on exam. Suspect low output HF. Will need Swan to further assess and guide therapy prior to starting milrinone. Will repeat echo.   Daniel Bensimhon,MD 1:22 PM

## 2013-10-02 NOTE — Patient Instructions (Addendum)
Doing ok.  Will stop amiodarone and coreg.  You can take an extra 20 mg of torsemide if weight increases more than 3 lbs in a day or 5 lbs in a week. You can also take an extra torsemide if you are short of breath.  Follow up in 1 month  Do the following things EVERYDAY: 1) Weigh yourself in the morning before breakfast. Write it down and keep it in a log. 2) Take your medicines as prescribed 3) Eat low salt foods-Limit salt (sodium) to 2000 mg per day.  4) Stay as active as you can everyday 5) Limit all fluids for the day to less than 2 liters 6)

## 2013-10-02 NOTE — Addendum Note (Signed)
Encounter addended by: Rande Brunt, NP on: 10/02/2013  4:19 PM<BR>     Documentation filed: Follow-up Section, Notes Section, Demographics Visit

## 2013-10-02 NOTE — Progress Notes (Addendum)
[name], female   DOB: 1940/05/22, 73 y.o.   MRN: [id number]  Primary Physician: Dr. Quintin Alto  Primary Cardiologist: Dr. Harl Bowie  Cardiac Surgeon: Dr Roxy Manns.   HPI: Ms. Lightcap is a 73 yo obese white female with known history of rheumatic fever during childhood and long standing history of chronic diastolic congestive heart failure. She also has a history of CKD, HTN , CVA 2003,, and PAF.  Progressed to severe symptomatic mitral regurgitation with paroxysmal atrial fibrillation and underwent minimally invasive Maze procedure with MV replacement (05/16/13). Was discharged 05/31/13 to [facility] center and weight was 207 lbs.  Pre-operative LHC in 2/15 showed no significant CAD.   Admitted to [facility] 06/04/13 with volume overload. Diuresed with lasix drip, Milrinone and metolazone. As volume improved she was transitioned to torsemide 80 mg daily and spironolactone 25 mg daily. She also had successful DC-CV 06/06/13 for A-flutter with RVR. Discharged on coumadin with INR followed by [facility] cardiology. Discharge weight 191 pounds. Discharged to [facility].   Follow up for Heart Failure:  Last visit creatinine elevated and held torsemide and potassium for 2 days. Went to PCP yesterday for weight gain and he took CXR that said she had fluid on her lungs and had her take 2 extra torsemide. Feeling pretty good. Fatigued and increased SOB. Weight trending up and 181-184 lbs. Denies CP, PND or edema. Sleeps in recliner from habit. Using rolling walker and granddaughter helps with medications. Able to go about 1/2 block before stopping d/t fatigue.    Labs: 06/13/13 K 4.5 Creatinine 1.69 Labs: 06/18/13 K 4.4 Creatinine 1.79 Labs: 5/15 K 4.5, creatinine 2.48, LFTs normal, TSH 4.6 (elevated), BNP 1514 (lower) Labs 08/28/13 K 4.5 Creatinine 2.8   SH: Lives in [place] with family. Never smoked. She does drink alcohol.   FH: Father and brother had MI  ROS: All systems negative except as listed in HPI, PMH and  Problem List.  Past Medical History  Diagnosis Date  . Chronic airway obstruction, not elsewhere classified   . Precordial pain   . Swelling of limb   . Dizziness and giddiness   . Unspecified transient cerebral ischemia     Diag. 2003  . Acute, but ill-defined, cerebrovascular disease     2000 [place]  . Shortness of breath   . Obesity   . Juvenile rheumatic fever     age 26  . Unspecified essential hypertension   . Hyperlipidemia   . Mitral regurgitation 02/16/2013  . Chronic kidney disease   . CHF (congestive heart failure) 04/23/2013  . Paroxysmal atrial fibrillation 02/16/2013    Recurrent paroxysmal atrial fibrillation, first diagnosed December 2014  . Chronic diastolic congestive heart failure   . Arthritis     Chronic left knee pain  . Aortic insufficiency due to bicuspid aortic valve     moderate by TEE  . 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 S/P Maze operation for atrial fibrillation 05/16/2013    Complete bilateral atrial lesion set using cryothermy and bipolar radiofrequency ablation with oversewing of LA appendage    Current Outpatient Prescriptions  Medication Sig Dispense Refill  . aspirin 81 MG tablet Take 81 mg by mouth daily.      Marland Kitchen oxybutynin (DITROPAN-XL) 5 MG 24 hr tablet Take 5 mg by mouth at bedtime.      Marland Kitchen  potassium chloride SA (K-DUR,KLOR-CON) 20 MEQ tablet Take 1 tablet (20 mEq total) by mouth daily.  60 tablet  6  . simvastatin (ZOCOR) 10 MG tablet Take 1 tablet (10 mg total) by mouth daily at 6 PM.  30 tablet  11  . spironolactone (ALDACTONE) 25 MG tablet Take 1 tablet (25 mg total) by mouth daily.      Marland Kitchen torsemide (DEMADEX) 20 MG tablet Take 2 tablets (40 mg total) by mouth daily.  90 tablet  3  . traMADol (ULTRAM) 50 MG tablet Take 1 tablet (50 mg total) by mouth every 12 (twelve) hours as  needed for moderate pain.  60 tablet  5  . warfarin (COUMADIN) 2 MG tablet 1 mg.       No current facility-administered medications for this encounter.    Filed Vitals:   10/02/13 1113  BP: 100/52  Pulse: 50  Resp: 18  Weight: 191 lb 2 oz (86.694 kg)  SpO2: 91%    PHYSICAL EXAM: General:  NAD. No resp difficulty Sitting on rolling walker. Granddaughter present.  HEENT: normal Neck: supple. JVD 7. Carotids 2+ bilaterally; no bruits. No lymphadenopathy or thryomegaly appreciated. Cor: PMI normal. Irregular rate & rhythm. No rubs, gallops or murmurs. Lungs: Slight crackles at bases bilaterally.  Abdomen: soft, nontender, nondistended. No hepatosplenomegaly. No bruits or masses. Good bowel sounds. Extremities: no cyanosis, clubbing, rash, 1+ ankle edema.   Neuro: alert & orientedx3, cranial nerves grossly intact. Moves all 4 extremities w/o difficulty. Affect pleasant.  EKG: SB with 1st degree AV block with PACs 45 bpm  ASSESSMENT & PLAN:  1. Chronic systolic CHF with RV failure: EF 40-45% with RV dysfunction on echo in 3/15.  NYHA III-IIIb symptoms and volume status appears mildly elevated. Will continue torsemide 40 mg daily and instructed to take an extra 20 mg of torsemide if weight increases more than 3 lbs in a day or 5 lbs in a week.  - She is SB today, HR in the 40s and having fatigue. Will stop amiodarone today and coreg.  - Will continue spiro 25 mg daily. - Check BMET and pro-BNP today - Continue lower extremity compression hose.  - Concerned that the patient is not doing well and is suffering from low output. As above will check BMET and pro-BNP. Kidney function has continued to trend up. Patient needed milrinone in the hospital. Patient is adamant about she does not want to come to the hospital or have any more procedures. Discussed getting palliative care consult which they are interested in purusuing.  -Reinforced the need and importance of daily weights, a low sodium  diet, and fluid restriction (less than 2 L a day). Instructed to call the HF clinic if weight increases more than 3 lbs overnight or 5 lbs in a week.  2. PAF mal atrial fibrillation and flutter: S/p Maze with MV repair.  Had atypical atrial flutter subsequently with successful DC-CV 06/06/13.  - SB with 1st degree AV block with PACs. Will stop amiodarone. Continue coumadin managed by PCP.  3. CKD stage III: Check BMET today.  F/U 2 weeks . Junie Bame B NP-C  10/02/2013

## 2013-10-03 ENCOUNTER — Inpatient Hospital Stay (HOSPITAL_COMMUNITY): Payer: Medicare HMO

## 2013-10-03 ENCOUNTER — Inpatient Hospital Stay (HOSPITAL_COMMUNITY)
Admission: RE | Admit: 2013-10-03 | Discharge: 2013-10-11 | DRG: 682 | Disposition: A | Discharge status: Skilled Nursing Facility | Payer: Medicare HMO | Source: Ambulatory Visit | Attending: Internal Medicine | Admitting: Internal Medicine

## 2013-10-03 DIAGNOSIS — B954 Other streptococcus as the cause of diseases classified elsewhere: Secondary | ICD-10-CM | POA: Diagnosis not present

## 2013-10-03 DIAGNOSIS — Z8673 Personal history of transient ischemic attack (TIA), and cerebral infarction without residual deficits: Secondary | ICD-10-CM | POA: Diagnosis not present

## 2013-10-03 DIAGNOSIS — N179 Acute kidney failure, unspecified: Principal | ICD-10-CM | POA: Diagnosis present

## 2013-10-03 DIAGNOSIS — I130 Hypertensive heart and chronic kidney disease with heart failure and stage 1 through stage 4 chronic kidney disease, or unspecified chronic kidney disease: Secondary | ICD-10-CM | POA: Diagnosis present

## 2013-10-03 DIAGNOSIS — Z96659 Presence of unspecified artificial knee joint: Secondary | ICD-10-CM

## 2013-10-03 DIAGNOSIS — R7881 Bacteremia: Secondary | ICD-10-CM | POA: Diagnosis not present

## 2013-10-03 DIAGNOSIS — I509 Heart failure, unspecified: Secondary | ICD-10-CM | POA: Diagnosis present

## 2013-10-03 DIAGNOSIS — Q231 Congenital insufficiency of aortic valve: Secondary | ICD-10-CM | POA: Diagnosis not present

## 2013-10-03 DIAGNOSIS — J449 Chronic obstructive pulmonary disease, unspecified: Secondary | ICD-10-CM | POA: Diagnosis present

## 2013-10-03 DIAGNOSIS — Z9889 Other specified postprocedural states: Secondary | ICD-10-CM

## 2013-10-03 DIAGNOSIS — G8929 Other chronic pain: Secondary | ICD-10-CM | POA: Diagnosis present

## 2013-10-03 DIAGNOSIS — Z952 Presence of prosthetic heart valve: Secondary | ICD-10-CM

## 2013-10-03 DIAGNOSIS — N184 Chronic kidney disease, stage 4 (severe): Secondary | ICD-10-CM | POA: Diagnosis present

## 2013-10-03 DIAGNOSIS — I498 Other specified cardiac arrhythmias: Secondary | ICD-10-CM | POA: Diagnosis present

## 2013-10-03 DIAGNOSIS — E785 Hyperlipidemia, unspecified: Secondary | ICD-10-CM | POA: Diagnosis present

## 2013-10-03 DIAGNOSIS — Z8541 Personal history of malignant neoplasm of cervix uteri: Secondary | ICD-10-CM

## 2013-10-03 DIAGNOSIS — I480 Paroxysmal atrial fibrillation: Secondary | ICD-10-CM

## 2013-10-03 DIAGNOSIS — Z8679 Personal history of other diseases of the circulatory system: Secondary | ICD-10-CM

## 2013-10-03 DIAGNOSIS — I5023 Acute on chronic systolic (congestive) heart failure: Secondary | ICD-10-CM | POA: Diagnosis present

## 2013-10-03 DIAGNOSIS — I4892 Unspecified atrial flutter: Secondary | ICD-10-CM | POA: Diagnosis present

## 2013-10-03 DIAGNOSIS — Z7982 Long term (current) use of aspirin: Secondary | ICD-10-CM | POA: Diagnosis not present

## 2013-10-03 DIAGNOSIS — R5381 Other malaise: Secondary | ICD-10-CM | POA: Diagnosis present

## 2013-10-03 DIAGNOSIS — I440 Atrioventricular block, first degree: Secondary | ICD-10-CM | POA: Diagnosis present

## 2013-10-03 DIAGNOSIS — I5033 Acute on chronic diastolic (congestive) heart failure: Secondary | ICD-10-CM

## 2013-10-03 DIAGNOSIS — N183 Chronic kidney disease, stage 3 unspecified: Secondary | ICD-10-CM

## 2013-10-03 DIAGNOSIS — R570 Cardiogenic shock: Secondary | ICD-10-CM | POA: Diagnosis present

## 2013-10-03 DIAGNOSIS — E876 Hypokalemia: Secondary | ICD-10-CM | POA: Diagnosis not present

## 2013-10-03 DIAGNOSIS — M109 Gout, unspecified: Secondary | ICD-10-CM | POA: Diagnosis present

## 2013-10-03 DIAGNOSIS — I4891 Unspecified atrial fibrillation: Secondary | ICD-10-CM | POA: Diagnosis present

## 2013-10-03 DIAGNOSIS — Z66 Do not resuscitate: Secondary | ICD-10-CM | POA: Diagnosis present

## 2013-10-03 DIAGNOSIS — Z8249 Family history of ischemic heart disease and other diseases of the circulatory system: Secondary | ICD-10-CM | POA: Diagnosis not present

## 2013-10-03 DIAGNOSIS — J4489 Other specified chronic obstructive pulmonary disease: Secondary | ICD-10-CM | POA: Diagnosis present

## 2013-10-03 DIAGNOSIS — N189 Chronic kidney disease, unspecified: Secondary | ICD-10-CM | POA: Diagnosis present

## 2013-10-03 DIAGNOSIS — I5043 Acute on chronic combined systolic (congestive) and diastolic (congestive) heart failure: Secondary | ICD-10-CM | POA: Diagnosis present

## 2013-10-03 DIAGNOSIS — E669 Obesity, unspecified: Secondary | ICD-10-CM | POA: Diagnosis present

## 2013-10-03 DIAGNOSIS — B955 Unspecified streptococcus as the cause of diseases classified elsewhere: Secondary | ICD-10-CM

## 2013-10-03 DIAGNOSIS — Z7901 Long term (current) use of anticoagulants: Secondary | ICD-10-CM

## 2013-10-03 DIAGNOSIS — R0602 Shortness of breath: Secondary | ICD-10-CM | POA: Diagnosis present

## 2013-10-03 DIAGNOSIS — Z953 Presence of xenogenic heart valve: Secondary | ICD-10-CM

## 2013-10-03 LAB — COMPREHENSIVE METABOLIC PANEL
ALT: 13 U/L (ref 0–35)
ANION GAP: 12 (ref 5–15)
AST: 21 U/L (ref 0–37)
Albumin: 3 g/dL — ABNORMAL LOW (ref 3.5–5.2)
Alkaline Phosphatase: 96 U/L (ref 39–117)
BILIRUBIN TOTAL: 0.4 mg/dL (ref 0.3–1.2)
BUN: 43 mg/dL — AB (ref 6–23)
CHLORIDE: 101 meq/L (ref 96–112)
CO2: 28 mEq/L (ref 19–32)
Calcium: 9.3 mg/dL (ref 8.4–10.5)
Creatinine, Ser: 2.93 mg/dL — ABNORMAL HIGH (ref 0.50–1.10)
GFR calc non Af Amer: 15 mL/min — ABNORMAL LOW (ref >90)
GFR, EST AFRICAN AMERICAN: 17 mL/min — AB (ref >90)
GLUCOSE: 99 mg/dL (ref 70–99)
Potassium: 5.6 mEq/L — ABNORMAL HIGH (ref 3.7–5.3)
Sodium: 141 mEq/L (ref 137–147)
TOTAL PROTEIN: 7 g/dL (ref 6.0–8.3)

## 2013-10-03 LAB — CARBOXYHEMOGLOBIN
CARBOXYHEMOGLOBIN: 1.3 % (ref 0.5–1.5)
Methemoglobin: 0.7 % (ref 0.0–1.5)
O2 Saturation: 57.5 %
Total hemoglobin: 9.3 g/dL — ABNORMAL LOW (ref 12.0–16.0)

## 2013-10-03 LAB — TROPONIN I: Troponin I: <0.3 ng/mL (ref <0.30)

## 2013-10-03 LAB — PROTIME-INR
INR: 1.99 — ABNORMAL HIGH (ref 0.00–1.49)
PROTHROMBIN TIME: 22.6 s — AB (ref 11.6–15.2)

## 2013-10-03 LAB — MRSA PCR SCREENING: MRSA by PCR: NEGATIVE

## 2013-10-03 LAB — TSH: TSH: 7.14 u[IU]/mL — ABNORMAL HIGH (ref 0.350–4.500)

## 2013-10-03 LAB — MAGNESIUM: Magnesium: 2.6 mg/dL — ABNORMAL HIGH (ref 1.5–2.5)

## 2013-10-03 MED ORDER — MIDAZOLAM HCL 2 MG/2ML IJ SOLN
2.0000 mg | Freq: Once | INTRAMUSCULAR | Status: AC
  Administered 2013-10-03: 2 mg via INTRAVENOUS

## 2013-10-03 MED ORDER — ASPIRIN EC 81 MG PO TBEC
81.0000 mg | DELAYED_RELEASE_TABLET | Freq: Every day | ORAL | Status: DC
  Administered 2013-10-03 – 2013-10-05 (×3): 81 mg via ORAL
  Filled 2013-10-03 (×4): qty 1

## 2013-10-03 MED ORDER — SIMVASTATIN 10 MG PO TABS
10.0000 mg | ORAL_TABLET | Freq: Every day | ORAL | Status: DC
  Administered 2013-10-03 – 2013-10-10 (×8): 10 mg via ORAL
  Filled 2013-10-03 (×9): qty 1

## 2013-10-03 MED ORDER — FENTANYL CITRATE 0.05 MG/ML IJ SOLN
INTRAMUSCULAR | Status: AC
  Administered 2013-10-03: 25 ug via INTRAVENOUS
  Filled 2013-10-03: qty 2

## 2013-10-03 MED ORDER — WARFARIN SODIUM 2 MG PO TABS
2.0000 mg | ORAL_TABLET | Freq: Once | ORAL | Status: AC
  Administered 2013-10-03: 2 mg via ORAL
  Filled 2013-10-03: qty 1

## 2013-10-03 MED ORDER — FENTANYL CITRATE 0.05 MG/ML IJ SOLN
25.0000 ug | Freq: Once | INTRAMUSCULAR | Status: AC
  Administered 2013-10-03: 25 ug via INTRAVENOUS

## 2013-10-03 MED ORDER — OXYBUTYNIN CHLORIDE 5 MG PO TABS
5.0000 mg | ORAL_TABLET | Freq: Every day | ORAL | Status: DC
  Administered 2013-10-03 – 2013-10-10 (×8): 5 mg via ORAL
  Filled 2013-10-03 (×10): qty 1

## 2013-10-03 MED ORDER — SODIUM CHLORIDE 0.9 % IJ SOLN
3.0000 mL | Freq: Two times a day (BID) | INTRAMUSCULAR | Status: DC
  Administered 2013-10-03 – 2013-10-11 (×14): 3 mL via INTRAVENOUS

## 2013-10-03 MED ORDER — SODIUM CHLORIDE 0.9 % IJ SOLN
3.0000 mL | INTRAMUSCULAR | Status: DC | PRN
  Administered 2013-10-10: 3 mL via INTRAVENOUS

## 2013-10-03 MED ORDER — ACETAMINOPHEN 325 MG PO TABS: 650.0000 mg | ORAL_TABLET | ORAL | Status: DC | PRN

## 2013-10-03 MED ORDER — SODIUM CHLORIDE 0.9 % IV SOLN: 250.0000 mL | INTRAVENOUS | Status: DC | PRN

## 2013-10-03 MED ORDER — MIDAZOLAM HCL 2 MG/2ML IJ SOLN
INTRAMUSCULAR | Status: AC
  Administered 2013-10-03: 2 mg via INTRAVENOUS
  Filled 2013-10-03: qty 2

## 2013-10-03 MED ORDER — WARFARIN - PHARMACIST DOSING INPATIENT
Freq: Every day | Status: DC
  Administered 2013-10-03 – 2013-10-04 (×2)

## 2013-10-03 MED ORDER — TRAMADOL HCL 50 MG PO TABS
50.0000 mg | ORAL_TABLET | Freq: Two times a day (BID) | ORAL | Status: DC
  Administered 2013-10-03 – 2013-10-10 (×14): 50 mg via ORAL
  Filled 2013-10-03 (×15): qty 1

## 2013-10-03 MED ORDER — FUROSEMIDE 10 MG/ML IJ SOLN
80.0000 mg | Freq: Two times a day (BID) | INTRAMUSCULAR | Status: DC
  Administered 2013-10-03 – 2013-10-05 (×5): 80 mg via INTRAVENOUS
  Filled 2013-10-03 (×8): qty 8

## 2013-10-03 MED ORDER — ONDANSETRON HCL 4 MG/2ML IJ SOLN: 4.0000 mg | Freq: Four times a day (QID) | INTRAMUSCULAR | Status: DC | PRN

## 2013-10-03 MED ORDER — CETYLPYRIDINIUM CHLORIDE 0.05 % MT LIQD
7.0000 mL | Freq: Two times a day (BID) | OROMUCOSAL | Status: DC
  Administered 2013-10-03 – 2013-10-11 (×14): 7 mL via OROMUCOSAL
  Filled 2013-10-03 (×3): qty 7

## 2013-10-03 NOTE — Progress Notes (Signed)
Transferred to [facility] by bed, report given to RN.

## 2013-10-03 NOTE — Progress Notes (Signed)
Utilization review completed.  

## 2013-10-03 NOTE — Procedures (Signed)
Pulmonary Artery Catheter Insertion Procedure Note [name] [name] [id number] October 10, 1940  Procedure: Insertion of PA Catheter Indications: Assessment of intravascular volume  Procedure Details Consent: Risks of procedure as well as the alternatives and risks of each were explained to the (patient/caregiver).  Consent for procedure obtained. Time Out: Verified patient identification, verified procedure, site/side was marked, verified correct patient position, special equipment/implants available, medications/allergies/relevent history reviewed, required imaging and test results available.  Performed  Maximum sterile technique was used including antiseptics, cap, gloves, gown, hand hygiene, mask and sheet. Skin prep: Chlorhexidine; local anesthetic administered A venous sheath was placed in the RIJ using a modified Seldinger technique. Once the sheath was in place a Swan Ganz catheter was navigated into the right pulmonary artery using pressure waveform guidance.   Evaluation Blood flow good Complications: No apparent complications Patient did tolerate procedure well. Chest X-ray ordered to verify placement.  CXR: normal.  Glori Bickers MD 10/03/2013, 4:47 PM

## 2013-10-03 NOTE — Progress Notes (Signed)
Foley huddle done with charge nurse and three RN's.

## 2013-10-03 NOTE — Consult Note (Signed)
ANTICOAGULATION CONSULT NOTE - Initial Consult  Pharmacy Consult for Coumadin Indication: aflutter  Allergies  Allergen Reactions  . Indomethacin Other (See Comments)    dizziness  . Norvasc [Amlodipine Besylate] Cough    Patient Measurements: Height: 5\' 2"  (157.5 cm) Weight: 185 lb 13.6 oz (84.3 kg) IBW/kg (Calculated) : 50.1  Vital Signs: Temp: 97.9 F (36.6 C) (07/29 1330) Temp src: Oral (07/29 1330) BP: 148/42 mmHg (07/29 1330) Pulse Rate: 55 (07/29 1330)  Labs:  Recent Labs  10/02/13 1134 10/03/13 1416  LABPROT  --  22.6*  INR  --  1.99*  CREATININE 3.29*  --     Estimated Creatinine Clearance: 15.6 ml/min (by C-G formula based on Cr of 3.29).   Medical History: Past Medical History  Diagnosis Date  . Chronic airway obstruction, not elsewhere classified   . Precordial pain   . Swelling of limb   . Dizziness and giddiness   . Unspecified transient cerebral ischemia     Diag. 2003  . Acute, but ill-defined, cerebrovascular disease     2000 [place]  . Shortness of breath   . Obesity   . Juvenile rheumatic fever     age 48  . Unspecified essential hypertension   . Hyperlipidemia   . Mitral regurgitation 02/16/2013  . Chronic kidney disease   . CHF (congestive heart failure) 04/23/2013  . Paroxysmal atrial fibrillation 02/16/2013    Recurrent paroxysmal atrial fibrillation, first diagnosed December 2014  . Chronic diastolic congestive heart failure   . Arthritis     Chronic left knee pain  . Aortic insufficiency due to bicuspid aortic valve     moderate by TEE  . 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 S/P Maze operation for atrial fibrillation 05/16/2013    Complete bilateral atrial lesion set using cryothermy and bipolar radiofrequency ablation with oversewing of LA appendage   Assessment: 73yof  on coumadin pta for aflutter s/p DCCV 06/06/13, being admitted for low output heart failure and milrinone initiation. INR on admission is 1.99. Home dose is 2mg  Sun/Wed and 1mg  all other days. Patient was seen in clinic yesterday and was told not to take her coumadin last night, so last dose was 7/27.  Goal of Therapy:  INR 2-3 Monitor platelets by anticoagulation protocol: Yes   Plan:  1) Coumadin 2mg  x 1 tonight 2) Daily INR  Deboraha Sprang 10/03/2013,3:09 PM

## 2013-10-03 NOTE — Progress Notes (Signed)
Chest xray complete and Swan Ganz catheter placement verified by Dr. Haroldine Laws at the bedside.

## 2013-10-03 NOTE — Addendum Note (Signed)
Encounter addended by: Georga Kaufmann, CCT on: 10/03/2013  8:32 AM<BR>     Documentation filed: Charges VN

## 2013-10-04 DIAGNOSIS — N189 Chronic kidney disease, unspecified: Secondary | ICD-10-CM

## 2013-10-04 DIAGNOSIS — N179 Acute kidney failure, unspecified: Principal | ICD-10-CM

## 2013-10-04 LAB — BASIC METABOLIC PANEL
ANION GAP: 9 (ref 5–15)
BUN: 41 mg/dL — ABNORMAL HIGH (ref 6–23)
CALCIUM: 8.5 mg/dL (ref 8.4–10.5)
CHLORIDE: 104 meq/L (ref 96–112)
CO2: 28 mEq/L (ref 19–32)
CREATININE: 2.56 mg/dL — AB (ref 0.50–1.10)
GFR calc Af Amer: 20 mL/min — ABNORMAL LOW (ref >90)
GFR calc non Af Amer: 18 mL/min — ABNORMAL LOW (ref >90)
Glucose, Bld: 108 mg/dL — ABNORMAL HIGH (ref 70–99)
Potassium: 4.7 mEq/L (ref 3.7–5.3)
Sodium: 141 mEq/L (ref 137–147)

## 2013-10-04 LAB — CARBOXYHEMOGLOBIN
Carboxyhemoglobin: 1.1 % (ref 0.5–1.5)
Methemoglobin: 0.5 % (ref 0.0–1.5)
O2 SAT: 96.6 %
TOTAL HEMOGLOBIN: 9 g/dL — AB (ref 12.0–16.0)

## 2013-10-04 LAB — PROTIME-INR
INR: 2.05 — ABNORMAL HIGH (ref 0.00–1.49)
Prothrombin Time: 23.1 seconds — ABNORMAL HIGH (ref 11.6–15.2)

## 2013-10-04 LAB — TROPONIN I

## 2013-10-04 MED ORDER — WARFARIN SODIUM 1 MG PO TABS
1.0000 mg | ORAL_TABLET | Freq: Once | ORAL | Status: AC
  Administered 2013-10-04: 1 mg via ORAL
  Filled 2013-10-04: qty 1

## 2013-10-04 MED ORDER — DOPAMINE-DEXTROSE 3.2-5 MG/ML-% IV SOLN
2.0000 ug/kg/min | INTRAVENOUS | Status: DC
Start: 2013-10-04 — End: 2013-10-09
  Administered 2013-10-04 – 2013-10-07 (×2): 2 ug/kg/min via INTRAVENOUS
  Filled 2013-10-04 (×2): qty 250

## 2013-10-04 NOTE — Progress Notes (Signed)
Advanced Heart Failure Rounding Note   Subjective:   [name] [name] is a 73 yo obese white female with known history of rheumatic fever during childhood and long standing history of chronic diastolic congestive heart failure. She also has a history of CKD, HTN , CVA 2003,, and PAF. 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 Was discharged 05/31/13 to [facility] center and weight was 207 lbs. Pre-operative LHC in 2/15 showed no significant CAD.   Prior to admit amiodarone and coreg stopped due to bradycardia. Admitted yesterday with suspected low output. Swan placed.  Diuretics held.   Denies SOB/Orthopna.   Swan  CVP 15 PAP 47/19 PCWP 20 CO/CI 4/2.2   CO-OX 57%  Creatinine 3.29>2.93>2.56 Objective:   Weight Range:  Vital Signs:   Temp:  [97.5 F (36.4 C)-98.4 F (36.9 C)] 97.9 F (36.6 C) (07/30 0500) Pulse Rate:  [46-57] 50 (07/30 0500) Resp:  [14-24] 17 (07/30 0500) BP: (104-162)/(30-62) 111/35 mmHg (07/30 0500) SpO2:  [90 %-100 %] 98 % (07/30 0500) Weight:  [185 lb 13.6 oz (84.3 kg)-188 lb 11.4 oz (85.6 kg)] 188 lb 11.4 oz (85.6 kg) (07/30 0500) Last BM Date: 10/03/13  Weight change: Filed Weights   10/03/13 1330 10/04/13 0500  Weight: 185 lb 13.6 oz (84.3 kg) 188 lb 11.4 oz (85.6 kg)    Intake/Output:   Intake/Output Summary (Last 24 hours) at 10/04/13 0654 Last data filed at 10/04/13 0500  Gross per 24 hour  Intake    623 ml  Output   1100 ml  Net   -477 ml     Physical Exam: CVP 14  General:  Chronically ill appearing. No resp difficulty HEENT: normal Neck: supple. JVP to jaw . Carotids 2+ bilat; no bruits. No lymphadenopathy or thryomegaly appreciated. R neck swan  Cor: PMI nondisplaced. Bradycardic Regular rate & rhythm. No rubs, gallops or murmurs. Lungs: clear Abdomen: soft, nontender, nondistended. No hepatosplenomegaly.  No bruits or masses. Good bowel sounds. Extremities: no cyanosis, clubbing, rash, edema Neuro: alert & orientedx3, cranial nerves grossly intact. moves all 4 extremities w/o difficulty. Affect pleasant GU: Foley   Telemetry: Sinus brady 50s  Labs: Basic Metabolic Panel:  Recent Labs Lab 10/02/13 1134 10/03/13 1416 10/04/13 0122  NA 135* 141 141  K 5.5* 5.6* 4.7  CL 98 101 104  CO2 26 28 28   GLUCOSE 101* 99 108*  BUN 41* 43* 41*  CREATININE 3.29* 2.93* 2.56*  CALCIUM 9.2 9.3 8.5  MG  --  2.6*  --     Liver Function Tests:  Recent Labs Lab 10/03/13 1416  AST 21  ALT 13  ALKPHOS 96  BILITOT 0.4  PROT 7.0  ALBUMIN 3.0*   No results found for this basename: LIPASE, AMYLASE,  in the last 168 hours No results found for this basename: AMMONIA,  in the last 168 hours  CBC: No results found for this basename: WBC, NEUTROABS, HGB, HCT, MCV, PLT,  in the last 168 hours  Cardiac Enzymes:  Recent Labs Lab 10/03/13 1416 10/03/13 1916 10/04/13 0550  TROPONINI <0.30 <0.30 <0.30    BNP: BNP (last 3 results)  Recent Labs  07/18/13 1107 08/28/13 1418 10/02/13 1134  PROBNP 1514.0* 1518.0* 2909.0*     Other results:  EKG:   Imaging: Dg Chest Port 1 View  10/03/2013   CLINICAL DATA:  Swan-Ganz catheter placement.  EXAM: PORTABLE CHEST - 1 VIEW  COMPARISON:  Chest x-ray 10/01/2013.  FINDINGS: Interim placement Swan-Ganz catheter, its tip projected over distal right pulmonary artery. Cardiomegaly. Pulmonary vascularity stable. Stable pleural parenchymal thickening right lung base consistent with scarring. No pneumothorax. Surgical clips noted over the upper chest. No acute bony abnormality.  IMPRESSION: 1. Interim placement of Swan-Ganz catheter with tip of catheter projected over the distal right pulmonary artery. 2. Prior mitral valve replacement. 3. Pleural parenchymal scarring right lung base.   Electronically Signed   By: [place]   On: 10/03/2013 16:54       Medications:     Scheduled Medications: . antiseptic oral rinse  7 mL Mouth Rinse BID  . aspirin EC  81 mg Oral Daily  . furosemide  80 mg Intravenous BID  . oxybutynin  5 mg Oral Q2200  . simvastatin  10 mg Oral q1800  . sodium chloride  3 mL Intravenous Q12H  . traMADol  50 mg Oral Q12H  . Warfarin - Pharmacist Dosing Inpatient   Does not apply q1800     Infusions:     PRN Medications:  sodium chloride, acetaminophen, ondansetron (ZOFRAN) IV, sodium chloride   Assessment:  1) AKI  2) Cardiogenic shock: Echo (3/15) with EF 40-45%, mildly dilated RV with mildly decreased systolic function, bioprosthetic mitral valve.  - suspect low output  3) severe MR: Minimally invasive Maze procedure with MV replacement (05/16/13)  4) PAF  5) Sinus Loletha Grayer- no bb  Plan/Discussion:    Swan placed. Output ok. Volume status elevated but cardiac index this morning is adequate at 2.2. ECHO pending.   Maintaining sinus bradycardia. Off carvedilol and amiodarone. INR 2.0. Pharmacy managing INR.  PT to follow.   Length of Stay: 1  CLEGG,AMY NP-C  10/04/2013, 6:54 AM  Advanced Heart Failure Team Pager [phone] (M-F; [place])  Please contact [facility] for night-coverage after hours (4p -7a ) and weekends on [email or web address]  Patient seen with NP, agree with the above note.  Awaiting echo.  Elevated left and right heart filling pressures, cardiac index is fairly adequate this morning at 2.2.  Main problem here has been progressive renal dysfunction in the face of volume retention.  Management has been difficult.  For now, given bradycardia and renal failure, will add low dose dopamine @ 2 and try to continue diuresis with Lasix 80 mg IV bid.  Will see how she does with this regimen today. Will need to watch for recurrence of atrial fibrillation.   [name] 10/04/2013 7:31 AM

## 2013-10-04 NOTE — Progress Notes (Signed)
ANTICOAGULATION CONSULT NOTE - Follow up [place] for Coumadin Indication: aflutter  Allergies  Allergen Reactions  . Indomethacin Other (See Comments)    dizziness  . Norvasc [Amlodipine Besylate] Cough    Patient Measurements: Height: 5\' 2"  (157.5 cm) Weight: 188 lb 11.4 oz (85.6 kg) IBW/kg (Calculated) : 50.1  Vital Signs: Temp: 97.9 F (36.6 C) (07/30 1000) Temp src: Oral (07/30 0830) BP: 119/34 mmHg (07/30 1000) Pulse Rate: 54 (07/30 1000)  Labs:  Recent Labs  10/02/13 1134 10/03/13 1416 10/03/13 1916 10/04/13 0122 10/04/13 0550  LABPROT  --  22.6*  --  23.1*  --   INR  --  1.99*  --  2.05*  --   CREATININE 3.29* 2.93*  --  2.56*  --   TROPONINI  --  <0.30 <0.30  --  <0.30    Estimated Creatinine Clearance: 20.2 ml/min (by C-G formula based on Cr of 2.56).  Assessment: 72yof on coumadin pta for aflutter s/p DCCV 06/06/13, admitted for low output heart failure. Swan placed and initiated on dopamine today. INR is therapeutic at 2.08. No bleeding reported.  Home dose is 2mg  Sun/Wed and 1mg  all other days  Goal of Therapy:  INR 2-3 Monitor platelets by anticoagulation protocol: Yes   Plan:  1) Coumadin 1mg  x 1 tonight per home regimen 2) INR in AM  Deboraha Sprang 10/04/2013,10:43 AM

## 2013-10-05 ENCOUNTER — Encounter (HOSPITAL_COMMUNITY): Payer: Self-pay | Admitting: Emergency Medicine

## 2013-10-05 ENCOUNTER — Inpatient Hospital Stay (HOSPITAL_COMMUNITY): Payer: Medicare HMO

## 2013-10-05 DIAGNOSIS — I359 Nonrheumatic aortic valve disorder, unspecified: Secondary | ICD-10-CM

## 2013-10-05 LAB — BASIC METABOLIC PANEL
ANION GAP: 9 (ref 5–15)
BUN: 35 mg/dL — ABNORMAL HIGH (ref 6–23)
CALCIUM: 8.9 mg/dL (ref 8.4–10.5)
CO2: 31 mEq/L (ref 19–32)
Chloride: 100 mEq/L (ref 96–112)
Creatinine, Ser: 2.02 mg/dL — ABNORMAL HIGH (ref 0.50–1.10)
GFR calc Af Amer: 27 mL/min — ABNORMAL LOW (ref >90)
GFR, EST NON AFRICAN AMERICAN: 23 mL/min — AB (ref >90)
Glucose, Bld: 114 mg/dL — ABNORMAL HIGH (ref 70–99)
Potassium: 4 mEq/L (ref 3.7–5.3)
SODIUM: 140 meq/L (ref 137–147)

## 2013-10-05 LAB — PROTIME-INR
INR: 1.97 — ABNORMAL HIGH (ref 0.00–1.49)
PROTHROMBIN TIME: 22.4 s — AB (ref 11.6–15.2)

## 2013-10-05 LAB — CBC
HCT: 28.5 % — ABNORMAL LOW (ref 36.0–46.0)
Hemoglobin: 8.9 g/dL — ABNORMAL LOW (ref 12.0–15.0)
MCH: 27.8 pg (ref 26.0–34.0)
MCHC: 31.2 g/dL (ref 30.0–36.0)
MCV: 89.1 fL (ref 78.0–100.0)
PLATELETS: 189 10*3/uL (ref 150–400)
RBC: 3.2 MIL/uL — ABNORMAL LOW (ref 3.87–5.11)
RDW: 17.1 % — ABNORMAL HIGH (ref 11.5–15.5)
WBC: 7.5 10*3/uL (ref 4.0–10.5)

## 2013-10-05 LAB — URINALYSIS, ROUTINE W REFLEX MICROSCOPIC
Bilirubin Urine: NEGATIVE
GLUCOSE, UA: NEGATIVE mg/dL
Hgb urine dipstick: NEGATIVE
Ketones, ur: NEGATIVE mg/dL
LEUKOCYTES UA: NEGATIVE
Nitrite: NEGATIVE
Protein, ur: NEGATIVE mg/dL
Specific Gravity, Urine: 1.009 (ref 1.005–1.030)
Urobilinogen, UA: 0.2 mg/dL (ref 0.0–1.0)
pH: 5.5 (ref 5.0–8.0)

## 2013-10-05 LAB — CBC WITH DIFFERENTIAL/PLATELET
Basophils Absolute: 0 10*3/uL (ref 0.0–0.1)
Basophils Relative: 0 % (ref 0–1)
EOS PCT: 1 % (ref 0–5)
Eosinophils Absolute: 0.1 10*3/uL (ref 0.0–0.7)
HCT: 30.6 % — ABNORMAL LOW (ref 36.0–46.0)
Hemoglobin: 9.6 g/dL — ABNORMAL LOW (ref 12.0–15.0)
LYMPHS ABS: 1 10*3/uL (ref 0.7–4.0)
Lymphocytes Relative: 12 % (ref 12–46)
MCH: 28.1 pg (ref 26.0–34.0)
MCHC: 31.4 g/dL (ref 30.0–36.0)
MCV: 89.5 fL (ref 78.0–100.0)
MONOS PCT: 13 % — AB (ref 3–12)
Monocytes Absolute: 1.1 10*3/uL — ABNORMAL HIGH (ref 0.1–1.0)
NEUTROS PCT: 74 % (ref 43–77)
Neutro Abs: 6.3 10*3/uL (ref 1.7–7.7)
Platelets: 175 10*3/uL (ref 150–400)
RBC: 3.42 MIL/uL — AB (ref 3.87–5.11)
RDW: 17.1 % — ABNORMAL HIGH (ref 11.5–15.5)
WBC: 8.5 10*3/uL (ref 4.0–10.5)

## 2013-10-05 LAB — CARBOXYHEMOGLOBIN
Carboxyhemoglobin: 1.5 % (ref 0.5–1.5)
Methemoglobin: 0.6 % (ref 0.0–1.5)
O2 Saturation: 68.1 %
TOTAL HEMOGLOBIN: 9.1 g/dL — AB (ref 12.0–16.0)

## 2013-10-05 LAB — SEDIMENTATION RATE: Sed Rate: 102 mm/hr — ABNORMAL HIGH (ref 0–22)

## 2013-10-05 LAB — URIC ACID: Uric Acid, Serum: 12.4 mg/dL — ABNORMAL HIGH (ref 2.4–7.0)

## 2013-10-05 MED ORDER — DEXTROSE 5 % IV SOLN
1.0000 g | INTRAVENOUS | Status: DC
  Administered 2013-10-05 – 2013-10-06 (×2): 1 g via INTRAVENOUS
  Filled 2013-10-05 (×4): qty 10

## 2013-10-05 MED ORDER — OXYCODONE-ACETAMINOPHEN 5-325 MG PO TABS
1.0000 | ORAL_TABLET | ORAL | Status: DC | PRN
  Administered 2013-10-05: 1 via ORAL
  Filled 2013-10-05 (×11): qty 1

## 2013-10-05 MED ORDER — WARFARIN SODIUM 1 MG PO TABS
1.0000 mg | ORAL_TABLET | Freq: Once | ORAL | Status: AC
  Administered 2013-10-05: 1 mg via ORAL
  Filled 2013-10-05: qty 1

## 2013-10-05 NOTE — Progress Notes (Signed)
Came to see pt for complaints of pain at first in Lt forefoot, now in both feet though improved with support stockings off.  No erythema but both lower extreme ties warmer than rest of body. No discoloration, 2 +-3+ pulses, brisk capillary refill, severe pain with range of motion beginning at ankles.  Will check sed rate and uric acid.  Gout is a possibility but no redness.  If uric acid is elevated would begin colchicine.  She is on tramadol every 12 hours at home and that has been continued.    Will add percocet for pain in the meantime.

## 2013-10-05 NOTE — Progress Notes (Signed)
Patient complaining of pain in left foot, primarily lower half of upper foot and toes and lower third bottom of foot. Skin warm dry, capillary refill < 3 seconds, dorsalis pedis pulse 2+. Patient able to move foot and toes. Darrick Grinder, NP paged.

## 2013-10-05 NOTE — Progress Notes (Signed)
ANTICOAGULATION CONSULT NOTE - Follow up [place] for Coumadin Indication: aflutter  Allergies  Allergen Reactions  . Indomethacin Other (See Comments)    dizziness  . Norvasc [Amlodipine Besylate] Cough    Patient Measurements: Height: 5\' 2"  (157.5 cm) Weight: 184 lb 11.9 oz (83.8 kg) IBW/kg (Calculated) : 50.1  Vital Signs: Temp: 97.9 F (36.6 C) (07/31 0733) Temp src: Oral (07/31 0733) BP: 131/56 mmHg (07/31 0900) Pulse Rate: 64 (07/31 0900)  Labs:  Recent Labs  10/03/13 1416 10/03/13 1916 10/04/13 0122 10/04/13 0550 10/05/13 0500  HGB  --   --   --   --  8.9*  HCT  --   --   --   --  28.5*  PLT  --   --   --   --  189  LABPROT 22.6*  --  23.1*  --  22.4*  INR 1.99*  --  2.05*  --  1.97*  CREATININE 2.93*  --  2.56*  --  2.02*  TROPONINI <0.30 <0.30  --  <0.30  --     Estimated Creatinine Clearance: 25.3 ml/min (by C-G formula based on Cr of 2.02).  Assessment: 72yof on coumadin pta for aflutter s/p DCCV 06/06/13, admitted for low output heart failure.  INR is slightly low today at 1.97. No bleeding reported.  Home dose is 2mg  Sun/Wed and 1mg  all other days  Goal of Therapy:  INR 2-3 Monitor platelets by anticoagulation protocol: Yes   Plan:  1) Coumadin 1mg  x 1 tonight per home regimen 2) INR in AM  Eudelia Bunch, Pharm.D. [phone] 10/05/2013 11:37 AM

## 2013-10-05 NOTE — Progress Notes (Signed)
Echo Lab  2D Echocardiogram completed.  [place], RDCS 10/05/2013 10:27 AM

## 2013-10-05 NOTE — Progress Notes (Addendum)
[name], female   DOB: [birth year], 73 y.o.   MRN: [id number] Advanced Heart Failure Rounding Note   Subjective:   [name] [name] is a 73 yo obese white female with known history of rheumatic fever during childhood and long standing history of chronic diastolic congestive heart failure. She also has a history of CKD, HTN , CVA 2003,, and PAF. 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 Was discharged 05/31/13 to [facility] center and weight was 207 lbs. Pre-operative LHC in 2/15 showed no significant CAD.   Prior to admit amiodarone and coreg stopped due to bradycardia. Admitted Wednesday with suspected low output. Swan placed.  Diuretics held initially.  Patient was noted to be volume overloaded with elevated creatinine.  She was started on dopamine and IV Lasix and diuresed well yesterday, weight is down 4 lbs.   Denies SOB/Orthopna.   Swan  CVP 17 PAP 44/15 PCWP 22 CI 2.9  Creatinine 3.29>2.93>2.56>2.0 Objective:   Weight Range:  Vital Signs:   Temp:  [97.5 F (36.4 C)-99.1 F (37.3 C)] 99.1 F (37.3 C) (07/31 0600) Pulse Rate:  [49-68] 60 (07/31 0600) Resp:  [13-23] 18 (07/31 0600) BP: (84-159)/(28-76) 127/38 mmHg (07/31 0600) SpO2:  [91 %-100 %] 95 % (07/31 0600) Weight:  [184 lb 11.9 oz (83.8 kg)] 184 lb 11.9 oz (83.8 kg) (07/31 0300) Last BM Date: 10/03/13  Weight change: Filed Weights   10/03/13 1330 10/04/13 0500 10/05/13 0300  Weight: 185 lb 13.6 oz (84.3 kg) 188 lb 11.4 oz (85.6 kg) 184 lb 11.9 oz (83.8 kg)    Intake/Output:   Intake/Output Summary (Last 24 hours) at 10/05/13 0714 Last data filed at 10/05/13 0600  Gross per 24 hour  Intake 1065.28 ml  Output   3345 ml  Net -2279.72 ml     Physical Exam: CVP 14  General:  Chronically ill appearing. No resp difficulty HEENT: normal Neck: supple. JVP to  jaw . Carotids 2+ bilat; no bruits. No lymphadenopathy or thryomegaly appreciated. R neck swan  Cor: PMI nondisplaced. Bradycardic Regular rate & rhythm. No rubs, gallops or murmurs. 1+ ankle edema. Lungs: clear Abdomen: soft, nontender, nondistended. No hepatosplenomegaly. No bruits or masses. Good bowel sounds. Extremities: no cyanosis, clubbing, rash.  Neuro: alert & orientedx3, cranial nerves grossly intact. moves all 4 extremities w/o difficulty. Affect pleasant GU: Foley   Telemetry: Sinus brady 50s  Labs: Basic Metabolic Panel:  Recent Labs Lab 10/02/13 1134 10/03/13 1416 10/04/13 0122 10/05/13 0500  NA 135* 141 141 140  K 5.5* 5.6* 4.7 4.0  CL 98 101 104 100  CO2 26 28 28 31   GLUCOSE 101* 99 108* 114*  BUN 41* 43* 41* 35*  CREATININE 3.29* 2.93* 2.56* 2.02*  CALCIUM 9.2 9.3 8.5 8.9  MG  --  2.6*  --   --     Liver Function Tests:  Recent Labs Lab 10/03/13 1416  AST 21  ALT 13  ALKPHOS 96  BILITOT 0.4  PROT 7.0  ALBUMIN 3.0*   No results found for this basename: LIPASE, AMYLASE,  in the last 168 hours No results found for this basename: AMMONIA,  in the last 168 hours  CBC:  Recent Labs Lab 10/05/13 0500  WBC 7.5  HGB 8.9*  HCT 28.5*  MCV 89.1  PLT 189    Cardiac Enzymes:  Recent Labs Lab 10/03/13 1416 10/03/13 1916 10/04/13 0550  TROPONINI <  0.30 <0.30 <0.30    BNP: BNP (last 3 results)  Recent Labs  07/18/13 1107 08/28/13 1418 10/02/13 1134  PROBNP 1514.0* 1518.0* 2909.0*     Other results:  EKG:   Imaging: Dg Chest Port 1 View  10/03/2013   CLINICAL DATA:  Swan-Ganz catheter placement.  EXAM: PORTABLE CHEST - 1 VIEW  COMPARISON:  Chest x-ray 10/01/2013.  FINDINGS: Interim placement Swan-Ganz catheter, its tip projected over distal right pulmonary artery. Cardiomegaly. Pulmonary vascularity stable. Stable pleural parenchymal thickening right lung base consistent with scarring. No pneumothorax. Surgical clips noted over the  upper chest. No acute bony abnormality.  IMPRESSION: 1. Interim placement of Swan-Ganz catheter with tip of catheter projected over the distal right pulmonary artery. 2. Prior mitral valve replacement. 3. Pleural parenchymal scarring right lung base.   Electronically Signed   By: [place]   On: 10/03/2013 16:54     Medications:     Scheduled Medications: . antiseptic oral rinse  7 mL Mouth Rinse BID  . aspirin EC  81 mg Oral Daily  . furosemide  80 mg Intravenous BID  . oxybutynin  5 mg Oral Q2200  . simvastatin  10 mg Oral q1800  . sodium chloride  3 mL Intravenous Q12H  . traMADol  50 mg Oral Q12H  . Warfarin - Pharmacist Dosing Inpatient   Does not apply q1800    Infusions: . DOPamine 2 mcg/kg/min (10/04/13 0936)    PRN Medications: sodium chloride, acetaminophen, ondansetron (ZOFRAN) IV, sodium chloride   Assessment:  1) AKI  2) Cardiogenic shock: Echo (3/15) with EF 40-45%, mildly dilated RV with mildly decreased systolic function, bioprosthetic mitral valve.  - suspect low output  3) severe MR: Minimally invasive Maze procedure with MV replacement (05/16/13)  4) PAF  5) Sinus Brady- no bb  Plan/Discussion:    Left and right heart filling pressures remain elevated, cardiac index is good this morning at 2.9 on dopamine.  Main problem here has been progressive renal dysfunction in the face of volume retention.  Management has been difficult at home.  Currently, she is doing well on dopamine @ 2 and Lasix 80 mg IV bid.  Creatinine is improved today and weight is down 4 lbs.  Can remove Swan, leave sheath for CVPs.  Needs more diuresis, can increase Lasix if needed. Will need to watch for recurrence of atrial fibrillation.   Echo pending.  [name] 10/05/2013 7:14 AM

## 2013-10-05 NOTE — Progress Notes (Signed)
Paged Advanced Heart Failure Service, received return call from Dr. Haroldine Laws. Informed Dr. Haroldine Laws patient complaining of pain at the top of the left foot and to the toes.  Dr. Haroldine Laws advised to remove TED hose.  TED hose removed.

## 2013-10-06 DIAGNOSIS — I509 Heart failure, unspecified: Secondary | ICD-10-CM

## 2013-10-06 DIAGNOSIS — I5033 Acute on chronic diastolic (congestive) heart failure: Secondary | ICD-10-CM

## 2013-10-06 LAB — PROTIME-INR
INR: 2.08 — ABNORMAL HIGH (ref 0.00–1.49)
Prothrombin Time: 23.4 seconds — ABNORMAL HIGH (ref 11.6–15.2)

## 2013-10-06 LAB — GLUCOSE, CAPILLARY
GLUCOSE-CAPILLARY: 112 mg/dL — AB (ref 70–99)
Glucose-Capillary: 195 mg/dL — ABNORMAL HIGH (ref 70–99)

## 2013-10-06 LAB — BASIC METABOLIC PANEL
Anion gap: 11 (ref 5–15)
BUN: 32 mg/dL — AB (ref 6–23)
CALCIUM: 9.2 mg/dL (ref 8.4–10.5)
CO2: 32 meq/L (ref 19–32)
CREATININE: 1.77 mg/dL — AB (ref 0.50–1.10)
Chloride: 94 mEq/L — ABNORMAL LOW (ref 96–112)
GFR calc Af Amer: 32 mL/min — ABNORMAL LOW (ref >90)
GFR, EST NON AFRICAN AMERICAN: 28 mL/min — AB (ref >90)
GLUCOSE: 97 mg/dL (ref 70–99)
Potassium: 4.3 mEq/L (ref 3.7–5.3)
Sodium: 137 mEq/L (ref 137–147)

## 2013-10-06 LAB — CBC
HEMATOCRIT: 28.6 % — AB (ref 36.0–46.0)
HEMOGLOBIN: 8.9 g/dL — AB (ref 12.0–15.0)
MCH: 27.4 pg (ref 26.0–34.0)
MCHC: 31.1 g/dL (ref 30.0–36.0)
MCV: 88 fL (ref 78.0–100.0)
Platelets: 186 10*3/uL (ref 150–400)
RBC: 3.25 MIL/uL — AB (ref 3.87–5.11)
RDW: 16.9 % — ABNORMAL HIGH (ref 11.5–15.5)
WBC: 8.4 10*3/uL (ref 4.0–10.5)

## 2013-10-06 LAB — CARBOXYHEMOGLOBIN
Carboxyhemoglobin: 2 % — ABNORMAL HIGH (ref 0.5–1.5)
Methemoglobin: 0.6 % (ref 0.0–1.5)
O2 Saturation: 68.4 %
TOTAL HEMOGLOBIN: 9.3 g/dL — AB (ref 12.0–16.0)

## 2013-10-06 MED ORDER — WARFARIN SODIUM 2 MG PO TABS
2.0000 mg | ORAL_TABLET | ORAL | Status: DC
  Administered 2013-10-07: 2 mg via ORAL
  Filled 2013-10-06: qty 1

## 2013-10-06 MED ORDER — VANCOMYCIN HCL IN DEXTROSE 1-5 GM/200ML-% IV SOLN
1000.0000 mg | Freq: Once | INTRAVENOUS | Status: AC
  Administered 2013-10-06: 1000 mg via INTRAVENOUS
  Filled 2013-10-06: qty 200

## 2013-10-06 MED ORDER — WARFARIN SODIUM 1 MG PO TABS
1.0000 mg | ORAL_TABLET | ORAL | Status: DC
  Administered 2013-10-06 – 2013-10-08 (×2): 1 mg via ORAL
  Filled 2013-10-06 (×3): qty 1

## 2013-10-06 MED ORDER — FUROSEMIDE 10 MG/ML IJ SOLN
80.0000 mg | Freq: Three times a day (TID) | INTRAMUSCULAR | Status: DC
  Administered 2013-10-06 – 2013-10-07 (×4): 80 mg via INTRAVENOUS
  Filled 2013-10-06 (×4): qty 8

## 2013-10-06 MED ORDER — COLCHICINE 0.6 MG PO TABS
0.6000 mg | ORAL_TABLET | Freq: Two times a day (BID) | ORAL | Status: DC
  Administered 2013-10-06 – 2013-10-10 (×10): 0.6 mg via ORAL
  Filled 2013-10-06 (×13): qty 1

## 2013-10-06 NOTE — Progress Notes (Signed)
ANTICOAGULATION CONSULT NOTE - Follow up [place] for Coumadin Indication: aflutter  Allergies  Allergen Reactions  . Indomethacin Other (See Comments)    dizziness  . Norvasc [Amlodipine Besylate] Cough    Patient Measurements: Height: 5\' 2"  (157.5 cm) Weight: 184 lb 1.4 oz (83.5 kg) IBW/kg (Calculated) : 50.1  Vital Signs: Temp: 97.9 F (36.6 C) (08/01 0800) Temp src: Oral (08/01 0800) BP: 123/38 mmHg (08/01 1000) Pulse Rate: 64 (08/01 1000)  Labs:  Recent Labs  10/03/13 1416 10/03/13 1916 10/04/13 0122 10/04/13 0550  10/05/13 0500 10/05/13 2100 10/06/13 0406  HGB  --   --   --   --   < > 8.9* 9.6* 8.9*  HCT  --   --   --   --   --  28.5* 30.6* 28.6*  PLT  --   --   --   --   --  189 175 186  LABPROT 22.6*  --  23.1*  --   --  22.4*  --  23.4*  INR 1.99*  --  2.05*  --   --  1.97*  --  2.08*  CREATININE 2.93*  --  2.56*  --   --  2.02*  --  1.77*  TROPONINI <0.30 <0.30  --  <0.30  --   --   --   --   < > = values in this interval not displayed.  Estimated Creatinine Clearance: 28.8 ml/min (by C-G formula based on Cr of 1.77).  Assessment: 72yof on coumadin pta for aflutter s/p DCCV 06/06/13, admitted for low output heart failure.  INR therapeutic today at 2.08. No bleeding reported.  Home dose is 2mg  Sun/Wed and 1mg  all other days  Goal of Therapy:  INR 2-3 Monitor platelets by anticoagulation protocol: Yes   Plan:  1) resume home dose of 1 mg daily except 2 mg on Sun/Wed 2) INR in AM  Eudelia Bunch, Pharm.D. [phone] 10/06/2013 11:09 AM

## 2013-10-06 NOTE — Progress Notes (Addendum)
Patient ID: BEAUTIFULL CISAR, female   DOB: February 10, 1941, 73 y.o.   MRN: [id number] Advanced Heart Failure Rounding Note   Subjective:   Ms. Lague is a 73 yo obese white female with known history of rheumatic fever during childhood and long standing history of chronic diastolic congestive heart failure. She also has a history of CKD, HTN , CVA 2003,, and PAF. 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 Was discharged 05/31/13 to [facility] center and weight was 207 lbs. Pre-operative LHC in 2/15 showed no significant CAD.   Prior to admit amiodarone and coreg stopped due to bradycardia. Admitted Wednesday with suspected low output. Swan placed.  Diuretics held initially.  Patient was noted to be volume overloaded with elevated creatinine.  She was started on dopamine and IV Lasix. I/O -[id number] yesterday but weight unchanged.  Creatinine improving.   Complaints of pain "behind toes" bilaterally, uric acid is high.   Denies SOB/Orthopnea.   CVP 10  Creatinine 3.29>2.93>2.56>2.0>1.77 Objective:   Weight Range:  Vital Signs:   Temp:  [86.2 F (30.1 C)-101.2 F (38.4 C)] 97.8 F (36.6 C) (08/01 0400) Pulse Rate:  [59-73] 64 (08/01 0600) Resp:  [11-22] 13 (08/01 0600) BP: (87-152)/(29-67) 118/43 mmHg (08/01 0600) SpO2:  [93 %-100 %] 99 % (08/01 0600) Weight:  [184 lb 1.4 oz (83.5 kg)] 184 lb 1.4 oz (83.5 kg) (08/01 0350) Last BM Date: 10/03/13  Weight change: Filed Weights   10/04/13 0500 10/05/13 0300 10/06/13 0350  Weight: 188 lb 11.4 oz (85.6 kg) 184 lb 11.9 oz (83.8 kg) 184 lb 1.4 oz (83.5 kg)    Intake/Output:   Intake/Output Summary (Last 24 hours) at 10/06/13 0745 Last data filed at 10/06/13 0600  Gross per 24 hour  Intake  653.9 ml  Output   2535 ml  Net -1881.1 ml     Physical Exam: CVP 14  General:  Chronically ill appearing. No resp  difficulty HEENT: normal Neck: supple. JVP to jaw . Carotids 2+ bilat; no bruits. No lymphadenopathy or thryomegaly appreciated. R neck swan  Cor: PMI nondisplaced. Bradycardic Regular rate & rhythm. No rubs, gallops or murmurs. 1+ ankle edema. Lungs: clear Abdomen: soft, nontender, nondistended. No hepatosplenomegaly. No bruits or masses. Good bowel sounds. Extremities: no cyanosis, clubbing, rash.  No redness around toe joints despite pain.  Neuro: alert & orientedx3, cranial nerves grossly intact. moves all 4 extremities w/o difficulty. Affect pleasant GU: Foley   Telemetry: Sinus brady 50s  Labs: Basic Metabolic Panel:  Recent Labs Lab 10/02/13 1134 10/03/13 1416 10/04/13 0122 10/05/13 0500 10/06/13 0406  NA 135* 141 141 140 137  K 5.5* 5.6* 4.7 4.0 4.3  CL 98 101 104 100 94*  CO2 26 28 28 31  32  GLUCOSE 101* 99 108* 114* 97  BUN 41* 43* 41* 35* 32*  CREATININE 3.29* 2.93* 2.56* 2.02* 1.77*  CALCIUM 9.2 9.3 8.5 8.9 9.2  MG  --  2.6*  --   --   --     Liver Function Tests:  Recent Labs Lab 10/03/13 1416  AST 21  ALT 13  ALKPHOS 96  BILITOT 0.4  PROT 7.0  ALBUMIN 3.0*   No results found for this basename: LIPASE, AMYLASE,  in the last 168 hours No results found for this basename: AMMONIA,  in the last 168 hours  CBC:  Recent Labs Lab 10/05/13 0500 10/05/13 2100 10/06/13 0406  WBC  7.5 8.5 8.4  NEUTROABS  --  6.3  --   HGB 8.9* 9.6* 8.9*  HCT 28.5* 30.6* 28.6*  MCV 89.1 89.5 88.0  PLT 189 175 186    Cardiac Enzymes:  Recent Labs Lab 10/03/13 1416 10/03/13 1916 10/04/13 0550  TROPONINI <0.30 <0.30 <0.30    BNP: BNP (last 3 results)  Recent Labs  07/18/13 1107 08/28/13 1418 10/02/13 1134  PROBNP 1514.0* 1518.0* 2909.0*     Other results:  EKG:   Imaging: Dg Chest Port 1 View  10/05/2013   CLINICAL DATA:  Fever  EXAM: PORTABLE CHEST - 1 VIEW  COMPARISON:  10/03/2013  FINDINGS: Chronic interstitial markings with bilateral lower  lobe/perihilar scarring. No focal consolidation. Mild interstitial edema is possible but not convincing. No pleural effusion or pneumothorax.  Cardiomegaly.  Prosthetic aortic valve.  Right IJ venous sheath.  IMPRESSION: Cardiomegaly with possible mild interstitial edema, equivocal.  Bilateral lower lobe/ perihilar scarring.  No focal consolidation.   Electronically Signed   By: Julian Hy M.D.   On: 10/05/2013 20:51     Medications:     Scheduled Medications: . antiseptic oral rinse  7 mL Mouth Rinse BID  . cefTRIAXone (ROCEPHIN)  IV  1 g Intravenous Q24H  . colchicine  0.6 mg Oral BID  . furosemide  80 mg Intravenous Q8H  . oxybutynin  5 mg Oral Q2200  . simvastatin  10 mg Oral q1800  . sodium chloride  3 mL Intravenous Q12H  . traMADol  50 mg Oral Q12H  . Warfarin - Pharmacist Dosing Inpatient   Does not apply q1800    Infusions: . DOPamine 2 mcg/kg/min (10/05/13 0800)    PRN Medications: sodium chloride, acetaminophen, ondansetron (ZOFRAN) IV, oxyCODONE-acetaminophen, sodium chloride   Assessment:  1) AKI  2) Cardiogenic shock: Echo (3/15) with EF 40-45%, mildly dilated RV with mildly decreased systolic function, bioprosthetic mitral valve.  Echo (7/15) with EF 50-55%, moderate AI, bioprosthetic MV ok.  - suspect low output  3) severe MR: Minimally invasive Maze procedure with MV replacement (05/16/13)  4) PAF  5) Sinus Brady- no bb 6) Suspected gout: foot pain, elevated uric acid.   Plan/Discussion:   Main problem here has been progressive renal dysfunction in the face of volume retention.  Management has been difficult at home.  Currently, she is doing well on dopamine @ 2 and Lasix 80 mg IV bid.  Creatinine is improved today. I want to see more diuresis as she remains volume overloaded.  Will increase Lasix to 80 mg IV every 8 hours. Will need to watch for recurrence of atrial fibrillation.   Suspect gout: add colchicine.   Out of bed with PT.   [name] 10/06/2013 7:45 AM

## 2013-10-06 NOTE — Progress Notes (Signed)
Palliative Medicine Team consult received and chart reviewed. I have attempted to contact patient's daughter and grand-daughter at all contact numbers. Messages left on generic voicemail for return calls but have been unable to get in touch with family. Requested RN page me if family in room to schedule a meeting.   Lane Hacker, DO Palliative Medicine [phone]

## 2013-10-07 LAB — URINE CULTURE
CULTURE: NO GROWTH
Colony Count: NO GROWTH
Special Requests: NORMAL

## 2013-10-07 LAB — CARBOXYHEMOGLOBIN
CARBOXYHEMOGLOBIN: 1.9 % — AB (ref 0.5–1.5)
Carboxyhemoglobin: 1.9 % — ABNORMAL HIGH (ref 0.5–1.5)
Methemoglobin: 0.5 % (ref 0.0–1.5)
Methemoglobin: 0.5 % (ref 0.0–1.5)
O2 SAT: 79 %
O2 Saturation: 80.1 %
Total hemoglobin: 8.5 g/dL — ABNORMAL LOW (ref 12.0–16.0)
Total hemoglobin: 8.6 g/dL — ABNORMAL LOW (ref 12.0–16.0)

## 2013-10-07 LAB — BASIC METABOLIC PANEL
Anion gap: 11 (ref 5–15)
Anion gap: 10 (ref 5–15)
BUN: 35 mg/dL — AB (ref 6–23)
BUN: 36 mg/dL — AB (ref 6–23)
CHLORIDE: 90 meq/L — AB (ref 96–112)
CO2: 31 meq/L (ref 19–32)
CO2: 34 mEq/L — ABNORMAL HIGH (ref 19–32)
CREATININE: 0.36 mg/dL — AB (ref 0.50–1.10)
CREATININE: 0.58 mg/dL (ref 0.50–1.10)
Calcium: 8.1 mg/dL — ABNORMAL LOW (ref 8.4–10.5)
Calcium: 9 mg/dL (ref 8.4–10.5)
Chloride: 93 mEq/L — ABNORMAL LOW (ref 96–112)
GFR calc Af Amer: >90 mL/min (ref >90)
GFR calc non Af Amer: >90 mL/min (ref >90)
GFR calc non Af Amer: 90 mL/min — ABNORMAL LOW (ref >90)
GLUCOSE: 161 mg/dL — AB (ref 70–99)
Glucose, Bld: 148 mg/dL — ABNORMAL HIGH (ref 70–99)
Potassium: 3.9 mEq/L (ref 3.7–5.3)
Potassium: 4.1 mEq/L (ref 3.7–5.3)
Sodium: 135 mEq/L — ABNORMAL LOW (ref 137–147)
Sodium: 134 mEq/L — ABNORMAL LOW (ref 137–147)

## 2013-10-07 LAB — GLUCOSE, CAPILLARY
GLUCOSE-CAPILLARY: 131 mg/dL — AB (ref 70–99)
Glucose-Capillary: 118 mg/dL — ABNORMAL HIGH (ref 70–99)
Glucose-Capillary: 102 mg/dL — ABNORMAL HIGH (ref 70–99)

## 2013-10-07 LAB — CBC
HEMATOCRIT: 27.4 % — AB (ref 36.0–46.0)
Hemoglobin: 8.4 g/dL — ABNORMAL LOW (ref 12.0–15.0)
MCH: 27.5 pg (ref 26.0–34.0)
MCHC: 30.7 g/dL (ref 30.0–36.0)
MCV: 89.5 fL (ref 78.0–100.0)
Platelets: 156 10*3/uL (ref 150–400)
RBC: 3.06 MIL/uL — ABNORMAL LOW (ref 3.87–5.11)
RDW: 16.8 % — AB (ref 11.5–15.5)
WBC: 8.4 10*3/uL (ref 4.0–10.5)

## 2013-10-07 LAB — PROTIME-INR
INR: 2.28 — ABNORMAL HIGH (ref 0.00–1.49)
Prothrombin Time: 25.1 seconds — ABNORMAL HIGH (ref 11.6–15.2)

## 2013-10-07 MED ORDER — DEXTROSE 5 % IV SOLN
15.0000 mg/h | INTRAVENOUS | Status: DC
  Administered 2013-10-07 – 2013-10-08 (×2): 15 mg/h via INTRAVENOUS
  Filled 2013-10-07 (×6): qty 25

## 2013-10-07 MED ORDER — METOLAZONE 2.5 MG PO TABS
2.5000 mg | ORAL_TABLET | Freq: Once | ORAL | Status: AC
  Administered 2013-10-07: 2.5 mg via ORAL
  Filled 2013-10-07: qty 1

## 2013-10-07 MED ORDER — DIPHENHYDRAMINE HCL 25 MG PO CAPS: 25.0000 mg | ORAL_CAPSULE | Freq: Once | ORAL | Status: DC

## 2013-10-07 MED ORDER — POTASSIUM CHLORIDE CRYS ER 20 MEQ PO TBCR
40.0000 meq | EXTENDED_RELEASE_TABLET | Freq: Once | ORAL | Status: AC
  Administered 2013-10-07: 40 meq via ORAL
  Filled 2013-10-07: qty 2

## 2013-10-07 NOTE — Progress Notes (Signed)
ANTICOAGULATION CONSULT NOTE - Follow up [place] for Coumadin Indication: aflutter  Allergies  Allergen Reactions  . Indomethacin Other (See Comments)    dizziness  . Norvasc [Amlodipine Besylate] Cough    Patient Measurements: Height: 5\' 2"  (157.5 cm) Weight: 185 lb 10 oz (84.2 kg) IBW/kg (Calculated) : 50.1  Vital Signs: Temp: 98.4 F (36.9 C) (08/02 0759) Temp src: Oral (08/02 0759) BP: 113/27 mmHg (08/02 0700) Pulse Rate: 60 (08/02 0700)  Labs:  Recent Labs  10/05/13 0500 10/05/13 2100 10/06/13 0406 10/07/13 0402  HGB 8.9* 9.6* 8.9* 8.4*  HCT 28.5* 30.6* 28.6* 27.4*  PLT 189 175 186 156  LABPROT 22.4*  --  23.4* 25.1*  INR 1.97*  --  2.08* 2.28*  CREATININE 2.02*  --  1.77* 0.36*    Estimated Creatinine Clearance: 63.9 ml/min (by C-G formula based on Cr of 0.36).  Assessment: 72yof on coumadin pta for aflutter s/p DCCV 06/06/13, admitted for low output heart failure.  INR therapeutic today at 2.28. No bleeding reported.  Home dose is 2mg  Sun/Wed and 1mg  all other days  Goal of Therapy:  INR 2-3 Monitor platelets by anticoagulation protocol: Yes   Plan:  - continue home dose of 1 mg daily except 2 mg on Sun/Wed - DC daily INR, recheck in a few days if still here - consider stopping rocephin as foot pain caused by gout and 1/2 + BC probably a contaminant  Eudelia Bunch, Pharm.D. [phone] 10/07/2013 10:01 AM

## 2013-10-07 NOTE — Progress Notes (Signed)
[name], female   DOB: 09-11-1940, 73 y.o.   MRN: [id number] Advanced Heart Failure Rounding Note   Subjective:   [name] [name] is a 73 yo obese white female with known history of rheumatic fever during childhood and long standing history of chronic diastolic congestive heart failure. She also has a history of CKD, HTN , CVA 2003,, and PAF. 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 Was discharged 05/31/13 to [facility] center and weight was 207 lbs. Pre-operative LHC in 2/15 showed no significant CAD.   Prior to admit amiodarone and coreg stopped due to bradycardia. Admitted Wednesday with suspected low output. Swan placed.  Diuretics held initially.  Patient was noted to be volume overloaded with elevated creatinine.  She was started on dopamine and IV Lasix. Not diuresing briskly and weight not changed.   Gout pain in feet improved.   Denies SOB/Orthopnea.   CVP 15  Creatinine 3.29>2.93>2.56>2.0>1.77>0.36 (suspect inaccurate) Objective:   Weight Range:  Vital Signs:   Temp:  [98.3 F (36.8 C)-98.9 F (37.2 C)] 98.4 F (36.9 C) (08/02 0759) Pulse Rate:  [60-77] 60 (08/02 0700) Resp:  [14-27] 17 (08/02 0700) BP: (78-141)/(27-77) 113/27 mmHg (08/02 0700) SpO2:  [66 %-99 %] 94 % (08/02 0700) Weight:  [185 lb 10 oz (84.2 kg)] 185 lb 10 oz (84.2 kg) (08/02 0400) Last BM Date: 10/03/13  Weight change: Filed Weights   10/05/13 0300 10/06/13 0350 10/07/13 0400  Weight: 184 lb 11.9 oz (83.8 kg) 184 lb 1.4 oz (83.5 kg) 185 lb 10 oz (84.2 kg)    Intake/Output:   Intake/Output Summary (Last 24 hours) at 10/07/13 1034 Last data filed at 10/07/13 0900  Gross per 24 hour  Intake    561 ml  Output   1905 ml  Net  -1344 ml     Physical Exam: CVP 14  General:  Chronically ill appearing. No resp difficulty HEENT: normal Neck:  supple. JVP to jaw . Carotids 2+ bilat; no bruits. No lymphadenopathy or thryomegaly appreciated. R neck swan  Cor: PMI nondisplaced. Bradycardic Regular rate & rhythm. No rubs, gallops or murmurs. 1+ ankle edema. Lungs: clear Abdomen: soft, nontender, nondistended. No hepatosplenomegaly. No bruits or masses. Good bowel sounds. Extremities: no cyanosis, clubbing, rash.  No redness around toe joints despite pain.  Neuro: alert & orientedx3, cranial nerves grossly intact. moves all 4 extremities w/o difficulty. Affect pleasant GU: Foley   Telemetry: Sinus brady 50s  Labs: Basic Metabolic Panel:  Recent Labs Lab 10/03/13 1416 10/04/13 0122 10/05/13 0500 10/06/13 0406 10/07/13 0402  NA 141 141 140 137 135*  K 5.6* 4.7 4.0 4.3 3.9  CL 101 104 100 94* 93*  CO2 28 28 31  32 31  GLUCOSE 99 108* 114* 97 161*  BUN 43* 41* 35* 32* 35*  CREATININE 2.93* 2.56* 2.02* 1.77* 0.36*  CALCIUM 9.3 8.5 8.9 9.2 8.1*  MG 2.6*  --   --   --   --     Liver Function Tests:  Recent Labs Lab 10/03/13 1416  AST 21  ALT 13  ALKPHOS 96  BILITOT 0.4  PROT 7.0  ALBUMIN 3.0*   No results found for this basename: LIPASE, AMYLASE,  in the last 168 hours No results found for this basename: AMMONIA,  in the last 168 hours  CBC:  Recent Labs Lab 10/05/13 0500 10/05/13 2100 10/06/13 0406 10/07/13 0402  WBC  7.5 8.5 8.4 8.4  NEUTROABS  --  6.3  --   --   HGB 8.9* 9.6* 8.9* 8.4*  HCT 28.5* 30.6* 28.6* 27.4*  MCV 89.1 89.5 88.0 89.5  PLT 189 175 186 156    Cardiac Enzymes:  Recent Labs Lab 10/03/13 1416 10/03/13 1916 10/04/13 0550  TROPONINI <0.30 <0.30 <0.30    BNP: BNP (last 3 results)  Recent Labs  07/18/13 1107 08/28/13 1418 10/02/13 1134  PROBNP 1514.0* 1518.0* 2909.0*     Other results:  EKG:   Imaging: Dg Chest Port 1 View  10/05/2013   CLINICAL DATA:  Fever  EXAM: PORTABLE CHEST - 1 VIEW  COMPARISON:  10/03/2013  FINDINGS: Chronic interstitial markings with  bilateral lower lobe/perihilar scarring. No focal consolidation. Mild interstitial edema is possible but not convincing. No pleural effusion or pneumothorax.  Cardiomegaly.  Prosthetic aortic valve.  Right IJ venous sheath.  IMPRESSION: Cardiomegaly with possible mild interstitial edema, equivocal.  Bilateral lower lobe/ perihilar scarring.  No focal consolidation.   Electronically Signed   By: Julian Hy M.D.   On: 10/05/2013 20:51     Medications:     Scheduled Medications: . antiseptic oral rinse  7 mL Mouth Rinse BID  . colchicine  0.6 mg Oral BID  . metolazone  2.5 mg Oral Once  . oxybutynin  5 mg Oral Q2200  . potassium chloride  40 mEq Oral Once  . simvastatin  10 mg Oral q1800  . sodium chloride  3 mL Intravenous Q12H  . traMADol  50 mg Oral Q12H  . warfarin  1 mg Oral Once per day on Mon Tue Thu Fri Sat   And  . warfarin  2 mg Oral Once per day on Sun Wed  . Warfarin - Pharmacist Dosing Inpatient   Does not apply q1800    Infusions: . DOPamine 2 mcg/kg/min (10/06/13 0800)  . furosemide (LASIX) infusion      PRN Medications: sodium chloride, acetaminophen, ondansetron (ZOFRAN) IV, oxyCODONE-acetaminophen, sodium chloride   Assessment:  1) AKI  2) Cardiogenic shock: Echo (3/15) with EF 40-45%, mildly dilated RV with mildly decreased systolic function, bioprosthetic mitral valve.  Echo (7/15) with EF 50-55%, moderate AI, bioprosthetic MV ok.  - suspect low output  3) severe MR: Minimally invasive Maze procedure with MV replacement (05/16/13)  4) PAF  5) Sinus Brady- no bb 6) Suspected gout: foot pain, elevated uric acid.   Plan/Discussion:   Main problem here has been progressive renal dysfunction in the face of volume retention.  Management has been difficult at home.  Creatinine has improved with dopamine but diuresis still not as vigorous as I would like.  Will transition to Lasix infusion at 15 mg/hr with dose of metolazone. Will need to watch for recurrence  of atrial fibrillation (remains in NSR).   Creatinine inaccurate today, repeat.   Gout pain improved with colchicine.   Out of bed with PT.   [name] [name] 10/07/2013 10:34 AM

## 2013-10-07 NOTE — Progress Notes (Signed)
Attempted again today to schedule a Palliative Meeting-contact person is [name] POA who gave the number [phone] to RN as a contact however this number rings once and then goes to a VM that has not been setup and will not allow a message to be left for a return call. A PMT provider is available at 12 noon tomorrow 8/3 and if [name] arrives in the room at anytime I have requested that RN page the Palliative on Call physician so that we can set up or confirm a time to discuss goals of care.  Lane Hacker, DO Palliative Medicine [phone]

## 2013-10-07 NOTE — Progress Notes (Signed)
Pt yelled out loud when she stood at bedside(to be assisted to chair) due to pain in bilat feet . Pt was immediately returned to bed .

## 2013-10-07 NOTE — Evaluation (Signed)
Physical Therapy Evaluation Patient Details Name: [name] [name] MRN: [id number] DOB: Jan 23, 1941 Today's Date: 10/07/2013   History of Present Illness  Adm 10/03/13 with CHF exacerbation, cardiogenic shock, and developed gout in bil feet. PMHx- CHF with EF 40%, CKD, CVA, PAF, MVR 04/2013  Clinical Impression  Pt admitted with CHF and developed gout bil feet. Pt very eager to return home and feels she will manage fine with grandson in/out and daughters also assisting. Has required short-term SNF after most recent hospitalization and may require again. Pt currently with functional limitations due to the deficits listed below (see PT Problem List).  Pt will benefit from skilled PT to increase their independence and safety with mobility to allow discharge to the venue listed below.       Follow Up Recommendations Home health PT (if family can provide 24/7 assist);Supervision/Assistance - 24 hour    Equipment Recommendations  Wheelchair (measurements PT);Wheelchair cushion (measurements PT)    Recommendations for Other Services OT consult     Precautions / Restrictions Precautions Precautions: Fall Restrictions Weight Bearing Restrictions: No      Mobility  Bed Mobility Overal bed mobility: Needs Assistance Bed Mobility: Supine to Sit     Supine to sit: HOB elevated;Min guard     General bed mobility comments: HOB 30 (pt feels SOB if goes lower) with rail and incr time  Transfers Overall transfer level: Needs assistance Equipment used: Rolling walker (2 wheeled) Transfers: Sit to/from Stand Sit to Stand: From elevated surface;Min guard         General transfer comment: attempted x 2 from bed at lowest height and pt stopped effort each time she began to clear buttocks "I can't"; elevated bed ~6 inches and pt then stood with only minguard assist (pt pushing down on RW handles)  Ambulation/Gait Ambulation/Gait assistance: Min guard Ambulation Distance (Feet): 2  Feet Assistive device: Rolling walker (2 wheeled) Gait Pattern/deviations: Decreased stride length;Shuffle Gait velocity: very slow Gait velocity interpretation: <1.8 ft/sec, indicative of risk for recurrent falls General Gait Details: pt reports pain in feet is tolerable with Lt slightly worse than Rt  Stairs            Wheelchair Mobility    Modified Rankin (Stroke Patients Only)       Balance Overall balance assessment: Needs assistance Sitting-balance support: Feet supported;No upper extremity supported Sitting balance-[name] Scale: Good     Standing balance support: Bilateral upper extremity supported Standing balance-[name] Scale: Poor Standing balance comment: with RW                             Pertinent Vitals/Pain Lt foot pain worse than Rt, however able to tolerate weight-bearing (pt unable to rate with #)    Home Living Family/patient expects to be discharged to:: Private residence Living Arrangements: Other relatives (grandson) Available Help at Discharge: Family;Available PRN/intermittently Type of Home: House Home Access: Ramped entrance     Home Layout: One level Home Equipment: Walker - 2 wheels;Shower seat      Prior Function Level of Independence: Independent with assistive device(s)         Comments: daughters do shopping and housework; grandson fixes her meals     Hand Dominance        Extremity/Trunk Assessment   Upper Extremity Assessment: Generalized weakness           Lower Extremity Assessment: Generalized weakness      Cervical / Trunk  Assessment: Kyphotic  Communication   Communication: No difficulties  Cognition Arousal/Alertness: Awake/alert Behavior During Therapy: WFL for tasks assessed/performed Overall Cognitive Status: Within Functional Limits for tasks assessed                      General Comments General comments (skin integrity, edema, etc.): pt very determined to return home  soon. Discussed possible need for wheelchair for transport from car into house, and pt hopeful she will not need    Exercises General Exercises - Lower Extremity Ankle Circles/Pumps: AROM;15 reps Straight Leg Raises: AAROM;Both;5 reps      Assessment/Plan    PT Assessment Patient needs continued PT services  PT Diagnosis Difficulty walking;Acute pain;Generalized weakness   PT Problem List Decreased strength;Decreased activity tolerance;Decreased balance;Decreased mobility;Decreased knowledge of use of DME;Decreased safety awareness;Pain  PT Treatment Interventions DME instruction;Gait training;Functional mobility training;Therapeutic activities;Therapeutic exercise;Patient/family education   PT Goals (Current goals can be found in the Care Plan section) Acute Rehab PT Goals Patient Stated Goal: to return home soon PT Goal Formulation: With patient Time For Goal Achievement: 10/14/13 Potential to Achieve Goals: Good    Frequency Min 3X/week   Barriers to discharge Decreased caregiver support pt reports her grandson is in/out    Co-evaluation               End of Session Equipment Utilized During Treatment: Gait belt;Oxygen Activity Tolerance: Patient limited by pain;Patient limited by fatigue Patient left: in chair;with call bell/phone within reach Nurse Communication: Mobility status         Time: 8616-8372 PT Time Calculation (min): 44 min   Charges:   PT Evaluation $Initial PT Evaluation Tier I: 1 Procedure PT Treatments $Gait Training: 8-22 mins $Therapeutic Activity: 8-22 mins   PT G Codes:          June Vacha 2013/10/14, 10:39 AM Pager [phone]

## 2013-10-08 ENCOUNTER — Inpatient Hospital Stay (HOSPITAL_COMMUNITY): Payer: Medicare HMO

## 2013-10-08 DIAGNOSIS — B955 Unspecified streptococcus as the cause of diseases classified elsewhere: Secondary | ICD-10-CM

## 2013-10-08 DIAGNOSIS — R7881 Bacteremia: Secondary | ICD-10-CM

## 2013-10-08 DIAGNOSIS — Z954 Presence of other heart-valve replacement: Secondary | ICD-10-CM

## 2013-10-08 DIAGNOSIS — R509 Fever, unspecified: Secondary | ICD-10-CM

## 2013-10-08 DIAGNOSIS — Q2381 Bicuspid aortic valve: Secondary | ICD-10-CM

## 2013-10-08 DIAGNOSIS — I5023 Acute on chronic systolic (congestive) heart failure: Secondary | ICD-10-CM

## 2013-10-08 DIAGNOSIS — Q231 Congenital insufficiency of aortic valve: Secondary | ICD-10-CM

## 2013-10-08 LAB — BASIC METABOLIC PANEL
Anion gap: 11 (ref 5–15)
BUN: 39 mg/dL — ABNORMAL HIGH (ref 6–23)
CO2: 36 mEq/L — ABNORMAL HIGH (ref 19–32)
Calcium: 9.3 mg/dL (ref 8.4–10.5)
Chloride: 91 mEq/L — ABNORMAL LOW (ref 96–112)
Creatinine, Ser: 1.53 mg/dL — ABNORMAL HIGH (ref 0.50–1.10)
GFR, EST AFRICAN AMERICAN: 38 mL/min — AB (ref >90)
GFR, EST NON AFRICAN AMERICAN: 33 mL/min — AB (ref >90)
GLUCOSE: 120 mg/dL — AB (ref 70–99)
Potassium: 4.8 mEq/L (ref 3.7–5.3)
SODIUM: 138 meq/L (ref 137–147)

## 2013-10-08 LAB — CBC
HCT: 29.4 % — ABNORMAL LOW (ref 36.0–46.0)
Hemoglobin: 8.9 g/dL — ABNORMAL LOW (ref 12.0–15.0)
MCH: 26.8 pg (ref 26.0–34.0)
MCHC: 30.3 g/dL (ref 30.0–36.0)
MCV: 88.6 fL (ref 78.0–100.0)
PLATELETS: 199 10*3/uL (ref 150–400)
RBC: 3.32 MIL/uL — ABNORMAL LOW (ref 3.87–5.11)
RDW: 17 % — ABNORMAL HIGH (ref 11.5–15.5)
WBC: 7.8 10*3/uL (ref 4.0–10.5)

## 2013-10-08 LAB — CARBOXYHEMOGLOBIN
Carboxyhemoglobin: 2.1 % — ABNORMAL HIGH (ref 0.5–1.5)
METHEMOGLOBIN: 0.7 % (ref 0.0–1.5)
O2 SAT: 75.5 %
Total hemoglobin: 9.5 g/dL — ABNORMAL LOW (ref 12.0–16.0)

## 2013-10-08 LAB — GLUCOSE, CAPILLARY: Glucose-Capillary: 119 mg/dL — ABNORMAL HIGH (ref 70–99)

## 2013-10-08 MED ORDER — METOLAZONE 2.5 MG PO TABS
2.5000 mg | ORAL_TABLET | Freq: Once | ORAL | Status: AC
  Administered 2013-10-08: 2.5 mg via ORAL
  Filled 2013-10-08: qty 1

## 2013-10-08 MED ORDER — DEXTROSE 5 % IV SOLN
1.0000 g | INTRAVENOUS | Status: DC
  Administered 2013-10-08 – 2013-10-10 (×3): 1 g via INTRAVENOUS
  Filled 2013-10-08 (×3): qty 10

## 2013-10-08 NOTE — Consult Note (Signed)
[facility]    Date of Admission:  10/03/2013   Total days of antibiotics 3               Reason for Consult: Probable viridans strep bacteremia    Referring Physician: Dr. [name]   Principal Problem:   Streptococcal bacteremia Active Problems:   S/P MVR (mitral valve replacement)   Acute on chronic systolic heart failure   Acute on chronic renal failure   Congenital bicuspid aortic valve   . antiseptic oral rinse  7 mL Mouth Rinse BID  . cefTRIAXone (ROCEPHIN)  IV  1 g Intravenous Q24H  . colchicine  0.6 mg Oral BID  . oxybutynin  5 mg Oral Q2200  . simvastatin  10 mg Oral q1800  . sodium chloride  3 mL Intravenous Q12H  . traMADol  50 mg Oral Q12H  . warfarin  1 mg Oral Once per day on Mon Tue Thu Fri Sat   And  . warfarin  2 mg Oral Once per day on Sun Wed  . Warfarin - Pharmacist Dosing Inpatient   Does not apply q1800    Recommendations: 1. Continue ceftriaxone 2. Lab will perform susceptibility testing of her strep and coag negative staph grown from one of 2 blood cultures   Assessment: I suspect that she did have transient streptococcal bacteremia causing her recent fever. The coagulase-negative staph is likely to be an insignificant contaminant. Although the one of 2 blood cultures is positive she is still at some risk for endocarditis given her bicuspid aortic valve and bioprosthetic mitral valve. No evidence of endocarditis was noted on transthoracic echocardiogram. There no evidence based guidelines for dressing the potential utility of transesophageal echocardiography in these cases. TEE is certainly likely to be more sensitive for vegetations and valvular insufficiency but I would recommend performing it only if it is likely to alter her management (i.e., would she be a surgical candidate for valve replacement or repair or if significant abnormalities were found). I will continue ceftriaxone while awaiting  susceptibility results. She will probably need a PICC for at least 2 weeks of IV antibiotic therapy but she  and her family are currently discussing goals of care with Palliative Care Team so I will hold off on PICC placement for now.  HPI: [name] [name] is a 73 y.o. female who underwent a Maze procedure for atrial fibrillation and bioprosthetic mitral valve replacement in March of this year. She was discharged to a skilled nursing facility. Since that time she has commented frequently to her family that she is always tired and cold. She recently gained some weight and developed worsening shortness of breath leading to admission with heart failure on July 28. She was afebrile upon admission but spiked a temperature to 101.2 on July 31. One of 2 blood cultures drawn at that time it has grown viridans strep and coagulase-negative staph. She was started on empiric ceftriaxone and has defervesced. She was unaware of her fever. He does not have any history of fever before admission and denies shaking chills and sweats.   Review of Systems: Pertinent items are noted in HPI.  Past Medical History  Diagnosis Date  . Chronic airway obstruction, not elsewhere classified   . Precordial pain   . Swelling of limb   . Dizziness and giddiness   . Unspecified transient cerebral ischemia     Diag. 2003  . Acute, but ill-defined,  cerebrovascular disease     2000 [facility]  . Shortness of breath   . Obesity   . Juvenile rheumatic fever     age 11  . Unspecified essential hypertension   . Hyperlipidemia   . Mitral regurgitation 02/16/2013  . Chronic kidney disease   . CHF (congestive heart failure) 04/23/2013  . Paroxysmal atrial fibrillation 02/16/2013    Recurrent paroxysmal atrial fibrillation, first diagnosed December 2014  . Chronic diastolic congestive heart failure   . Arthritis     Chronic left knee pain  . Aortic insufficiency due to bicuspid aortic valve     moderate by TEE  . S/P minimally  invasive mitral valve replacement with bioprosthetic valve and maze procedure 05/16/2013    27 mm Edwards magna mitral bovine bioprosthetic tissue valve placed via right thoracotomy  . S/P Maze operation for atrial fibrillation 05/16/2013    Complete bilateral atrial lesion set using cryothermy and bipolar radiofrequency ablation with oversewing of LA appendage    History  Substance Use Topics  . Smoking status: Never Smoker   . Smokeless tobacco: Never Used  . Alcohol Use: No    Family History  Problem Relation Age of Onset  . Heart failure Father   . Heart attack Brother    Allergies  Allergen Reactions  . Indomethacin Other (See Comments)    dizziness  . Norvasc [Amlodipine Besylate] Cough    OBJECTIVE: Blood pressure 174/58, pulse 79, temperature 98.2 F (36.8 C), temperature source Oral, resp. rate 19, height 5\' 2"  (1.575 m), weight 81 kg (178 lb 9.2 oz), SpO2 96.00%. General: He is alert and in no distress. She generally defers to her daughter to answer my questions Skin: No rash, splint or conjunctival hemorrhages noted Lungs: Clear anteriorly Cor: Regular S1 and S2 with physiologic splitting of the second heart sound. No murmurs heard Abdomen: Soft and nontender Joints and extremities: No acute abnormalities  Lab Results Lab Results  Component Value Date   WBC 7.8 10/08/2013   HGB 8.9* 10/08/2013   HCT 29.4* 10/08/2013   MCV 88.6 10/08/2013   PLT 199 10/08/2013    Lab Results  Component Value Date   CREATININE 1.53* 10/08/2013   BUN 39* 10/08/2013   NA 138 10/08/2013   K 4.8 10/08/2013   CL 91* 10/08/2013   CO2 36* 10/08/2013    Lab Results  Component Value Date   ALT 13 10/03/2013   AST 21 10/03/2013   ALKPHOS 96 10/03/2013   BILITOT 0.4 10/03/2013     Microbiology: Recent Results (from the past 240 hour(s))  MRSA PCR SCREENING     Status: None   Collection Time    10/03/13  1:21 PM      Result Value Ref Range Status   MRSA by PCR NEGATIVE  NEGATIVE Final   Comment:             The GeneXpert MRSA Assay (FDA     approved for NASAL specimens     only), is one component of a     comprehensive MRSA colonization     surveillance program. It is not     intended to diagnose MRSA     infection nor to guide or     monitor treatment for     MRSA infections.  URINE CULTURE     Status: None   Collection Time    10/05/13  9:02 PM      Result Value Ref Range Status   Specimen  Description URINE, CATHETERIZED   Final   Special Requests Normal   Final   Culture  Setup Time     Final   Value: 10/06/2013 01:30     Performed at [place]     Final   Value: NO GROWTH     Performed at [facility]   Culture     Final   Value: NO GROWTH     Performed at [facility]   Report Status 10/07/2013 FINAL   Final  CULTURE, BLOOD (ROUTINE X 2)     Status: None   Collection Time    10/05/13  9:30 PM      Result Value Ref Range Status   Specimen Description BLOOD LEFT ANTECUBITAL   Final   Special Requests     Final   Value: BOTTLES DRAWN AEROBIC AND ANAEROBIC 10CC AER 6CC ANA   Culture  Setup Time     Final   Value: 10/06/2013 01:11     Performed at [facility]   Culture     Final   Value: VIRIDANS STREPTOCOCCUS     STAPHYLOCOCCUS SPECIES (COAGULASE NEGATIVE)     Note: Gram Stain Report Called to,Read Back By and Verified With: [place] CRAVEN 10/06/13 @ 8:02PM BY RUSCOE A. THE SIGNIFICANCE OF ISOLATING THIS ORGANISM FROM A SINGLE SET OF BLOOD CULTURES WHEN MULTIPLE SETS ARE DRAWN IS UNCERTAIN. PLEASE NOTIFY THE      MICROBIOLOGY DEPARTMENT WITHIN ONE WEEK IF SPECIATION AND SENSITIVITIES ARE REQUIRED.     Performed at [facility]   Report Status PENDING   Incomplete  CULTURE, BLOOD (ROUTINE X 2)     Status: None   Collection Time    10/05/13  9:36 PM      Result Value Ref Range Status   Specimen Description BLOOD RIGHT HAND   Final   Special Requests BOTTLES DRAWN AEROBIC AND ANAEROBIC 10CC   Final   Culture   Setup Time     Final   Value: 10/06/2013 01:11     Performed at [facility]   Culture     Final   Value:        BLOOD CULTURE RECEIVED NO GROWTH TO DATE CULTURE WILL BE HELD FOR 5 DAYS BEFORE ISSUING A FINAL NEGATIVE REPORT     Performed at [facility]   Report Status PENDING   Incomplete  CULTURE, BLOOD (ROUTINE X 2)     Status: None   Collection Time    10/06/13 10:21 PM      Result Value Ref Range Status   Specimen Description BLOOD LEFT ANTECUBITAL   Final   Special Requests BOTTLES DRAWN AEROBIC AND ANAEROBIC 10CC EA   Final   Culture  Setup Time     Final   Value: 10/07/2013 00:57     Performed at [facility]   Culture     Final   Value:        BLOOD CULTURE RECEIVED NO GROWTH TO DATE CULTURE WILL BE HELD FOR 5 DAYS BEFORE ISSUING A FINAL NEGATIVE REPORT     Performed at [facility]   Report Status PENDING   Incomplete  CULTURE, BLOOD (ROUTINE X 2)     Status: None   Collection Time    10/06/13 10:32 PM      Result Value Ref Range Status   Specimen Description BLOOD RIGHT ANTECUBITAL   Final   Special  Requests BOTTLES DRAWN AEROBIC AND ANAEROBIC 5CC EA   Final   Culture  Setup Time     Final   Value: 10/07/2013 00:57     Performed at [facility]   Culture     Final   Value:        BLOOD CULTURE RECEIVED NO GROWTH TO DATE CULTURE WILL BE HELD FOR 5 DAYS BEFORE ISSUING A FINAL NEGATIVE REPORT     Performed at [facility]   Report Status PENDING   Incomplete    Michel Bickers, MD [facility] [phone] pager   [phone] cell 10/08/2013, 2:55 PM

## 2013-10-08 NOTE — Progress Notes (Signed)
Additional Medicare IM copy given to patient. Imran Nuon RN CCM Case Mgmt phone [phone] 

## 2013-10-08 NOTE — Progress Notes (Signed)
[name], female   DOB: Jul 09, 1940, 73 y.o.   MRN: [id number] Advanced Heart Failure Rounding Note   Subjective:   [name] [name] is a 73 yo obese white female with known history of rheumatic fever during childhood and long standing history of chronic diastolic congestive heart failure. She also has a history of CKD, HTN , CVA 2003,, and PAF. 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 Was discharged 05/31/13 to [facility] center and weight was 207 lbs. Pre-operative LHC in 2/15 showed no significant CAD.   Prior to admit amiodarone and coreg stopped due to bradycardia. Admitted Wednesday with suspected low output. Swan placed.  Diuretics held initially.  Patient was noted to be volume overloaded with elevated creatinine.  She was started on dopamine and IV Lasix. Not diuresing briskly and weight not changed.   Gout pain in feet improved.   Denies SOB/Orthopnea.  Not very mobile.  Co-ox 75%.   CVP 11  Creatinine 3.29>2.93>2.56>2.0>1.77>0.36 (suspect inaccurate)> 1.5 Objective:   Weight Range:  Vital Signs:   Temp:  [98.2 F (36.8 C)-98.7 F (37.1 C)] 98.7 F (37.1 C) (08/03 0357) Pulse Rate:  [58-78] 68 (08/03 0600) Resp:  [13-22] 22 (08/03 0600) BP: (81-150)/(32-76) 144/52 mmHg (08/03 0600) SpO2:  [92 %-100 %] 94 % (08/03 0600) Weight:  [178 lb 9.2 oz (81 kg)] 178 lb 9.2 oz (81 kg) (08/03 0500) Last BM Date: 10/06/13  Weight change: Filed Weights   10/06/13 0350 10/07/13 0400 10/08/13 0500  Weight: 184 lb 1.4 oz (83.5 kg) 185 lb 10 oz (84.2 kg) 178 lb 9.2 oz (81 kg)    Intake/Output:   Intake/Output Summary (Last 24 hours) at 10/08/13 0719 Last data filed at 10/08/13 0600  Gross per 24 hour  Intake  815.4 ml  Output   3665 ml  Net -2849.6 ml     Physical Exam: CVP 14  General:  Chronically ill appearing. No resp  difficulty HEENT: normal Neck: supple. JVP to jaw . Carotids 2+ bilat; no bruits. No lymphadenopathy or thryomegaly appreciated. R neck swan  Cor: PMI nondisplaced. Bradycardic Regular rate & rhythm. No rubs, gallops or murmurs. 1+ ankle edema. Lungs: clear Abdomen: soft, nontender, nondistended. No hepatosplenomegaly. No bruits or masses. Good bowel sounds. Extremities: no cyanosis, clubbing, rash.  No redness around toe joints despite pain.  Neuro: alert & orientedx3, cranial nerves grossly intact. moves all 4 extremities w/o difficulty. Affect pleasant GU: Foley   Telemetry: Sinus brady 50s  Labs: Basic Metabolic Panel:  Recent Labs Lab 10/03/13 1416  10/05/13 0500 10/06/13 0406 10/07/13 0402 10/07/13 1330 10/08/13 0300  NA 141  < > 140 137 135* 134* 138  K 5.6*  < > 4.0 4.3 3.9 4.1 4.8  CL 101  < > 100 94* 93* 90* 91*  CO2 28  < > 31 32 31 34* 36*  GLUCOSE 99  < > 114* 97 161* 148* 120*  BUN 43*  < > 35* 32* 35* 36* 39*  CREATININE 2.93*  < > 2.02* 1.77* 0.36* 0.58 1.53*  CALCIUM 9.3  < > 8.9 9.2 8.1* 9.0 9.3  MG 2.6*  --   --   --   --   --   --   < > = values in this interval not displayed.  Liver Function Tests:  Recent Labs Lab 10/03/13 1416  AST 21  ALT 13  ALKPHOS 96  BILITOT 0.4  PROT 7.0  ALBUMIN 3.0*   No results found for this basename: LIPASE, AMYLASE,  in the last 168 hours No results found for this basename: AMMONIA,  in the last 168 hours  CBC:  Recent Labs Lab 10/05/13 0500 10/05/13 2100 10/06/13 0406 10/07/13 0402 10/08/13 0300  WBC 7.5 8.5 8.4 8.4 7.8  NEUTROABS  --  6.3  --   --   --   HGB 8.9* 9.6* 8.9* 8.4* 8.9*  HCT 28.5* 30.6* 28.6* 27.4* 29.4*  MCV 89.1 89.5 88.0 89.5 88.6  PLT 189 175 186 156 199    Cardiac Enzymes:  Recent Labs Lab 10/03/13 1416 10/03/13 1916 10/04/13 0550  TROPONINI <0.30 <0.30 <0.30    BNP: BNP (last 3 results)  Recent Labs  07/18/13 1107 08/28/13 1418 10/02/13 1134  PROBNP 1514.0*  1518.0* 2909.0*     Other results:  EKG:   Imaging: No results found.   Medications:     Scheduled Medications: . antiseptic oral rinse  7 mL Mouth Rinse BID  . colchicine  0.6 mg Oral BID  . metolazone  2.5 mg Oral Once  . oxybutynin  5 mg Oral Q2200  . simvastatin  10 mg Oral q1800  . sodium chloride  3 mL Intravenous Q12H  . traMADol  50 mg Oral Q12H  . warfarin  1 mg Oral Once per day on Mon Tue Thu Fri Sat   And  . warfarin  2 mg Oral Once per day on Sun Wed  . Warfarin - Pharmacist Dosing Inpatient   Does not apply q1800    Infusions: . DOPamine 2 mcg/kg/min (10/07/13 2000)  . furosemide (LASIX) infusion 15 mg/hr (10/07/13 2000)    PRN Medications: sodium chloride, acetaminophen, ondansetron (ZOFRAN) IV, oxyCODONE-acetaminophen, sodium chloride   Assessment:  1) AKI  2) Cardiogenic shock: Echo (3/15) with EF 40-45%, mildly dilated RV with mildly decreased systolic function, bioprosthetic mitral valve.  Echo (7/15) with EF 50-55%, moderate AI, bioprosthetic MV ok.  - suspect low output  3) severe MR: Minimally invasive Maze procedure with MV replacement (05/16/13)  4) PAF  5) Sinus Brady- no bb 6) Suspected gout: foot pain, elevated uric acid.   Plan/Discussion:   Main problem here has been progressive renal dysfunction in the face of volume retention.  Management has been difficult at home.  Creatinine has improved with dopamine and diuresis improved with weight down on Lasix gtt + metolazone.  Continue current regimen today, CVP still elevated. Will need to watch for recurrence of atrial fibrillation (remains in NSR).    Gout pain improved with colchicine.   Out of bed with PT. She is interested in rehab at [facility] when discharged.   [name] [name] 10/08/2013 7:19 AM

## 2013-10-08 NOTE — Consult Note (Signed)
Patient [name] [name] [name] [name]      DOB: [birth year]      UKG:[id number]     Consult Note from the Palliative Medicine Team at [place] Requested by: Dr. Aundra Dubin     PCP: [name], MD Reason for Consultation: [place] and options.    Phone [phone]  Assessment of patients Current state: I met today with [name] [name], daughter, [name] [name], and sister and brother in [employer] [place]. I spent much time discussing her heart failure and the fact that this is not curable but a progressive disease. She tells me that her father and husband died of heart failure. She describes how her husband's doctor said "his heart will get weaker and weaker until it will kill him" but is unable to apply this to her situation. When I explained that this applies to her as well she tells me "so there's no hope for me?" and "so I'm just going to die?." I assured her that we all will die eventually but yes her heart disease will eventually cause her death. She is tearful but also tells me that she is ready when it is her time. She is a very religious person and says she is accepting whenever her time to die comes. We discussed code status and she decides DNR. They wish to continue treatment with Lasix and dopamine infusions, lab work, etc to optimize and her condition. I have discussed that we may not be able to make her condition much better as her renal function has been a problem. I will continue to follow and support and plan to discuss comfort measures and hospice options tomorrow.    Goals of Care: 1.  Code Status: DNR   2. Scope of Treatment: Continue medical supportive therapy such as infusions, IV fluids, labs, cardiac monitoring. She does not wish any "aggressive" measures such as CPR/intubation/shock or surgery. Will discuss more comfort options tomorrow.   4. Disposition: To be determined on outcomes.    3. Symptom Management:    Pain: Acetaminophen prn. Tramadol scheduled. Oxycodone-acetaminophen  5-325 mg every 4 hours   prn. Colchicine for gout pain.   Nausea: Ondansetron prn.   Weakness: Continue medical management. PT eval.    4. Psychosocial: Emotional support provided to patient and family.    Brief HPI: [name] [name] is a 73 yo female with advanced diastolic heart failure and worsening renal function. She is on furosemide and dopamine infusions to assist with fluid status and overload but overall prognosis is very poor. PMH significant for hyperlipidemia, mitral regurgitation, CKD, diastolic CHF, aortic insufficiency, atrial fibrillation, mitral valve replacement, maze operation.    ROS: Denies pain, nausea, constipation, anxiety, shortness of breath, dizziness.     PMH:  Past Medical History  Diagnosis Date  . Chronic airway obstruction, not elsewhere classified   . Precordial pain   . Swelling of limb   . Dizziness and giddiness   . Unspecified transient cerebral ischemia     Diag. 2003  . Acute, but ill-defined, cerebrovascular disease     2000 [place]  . Shortness of breath   . Obesity   . Juvenile rheumatic fever     age 85  . Unspecified essential hypertension   . Hyperlipidemia   . Mitral regurgitation 02/16/2013  . Chronic kidney disease   . CHF (congestive heart failure) 04/23/2013  . Paroxysmal atrial fibrillation 02/16/2013    Recurrent paroxysmal atrial fibrillation, first diagnosed December 2014  . Chronic diastolic congestive heart failure   .  Arthritis     Chronic left knee pain  . Aortic insufficiency due to bicuspid aortic valve     moderate by TEE  . 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 S/P Maze operation for atrial fibrillation 05/16/2013    Complete bilateral atrial lesion set using cryothermy and bipolar radiofrequency ablation with oversewing of LA appendage     PSH: Past Surgical  History  Procedure Laterality Date  . Total knee arthroplasty Right   . Abdominal hysterectomy      Cervical Cancer  . Parathyroid/thyroid surgery      tumor  . Tee without cardioversion N/A 04/06/2013    Procedure: TRANSESOPHAGEAL ECHOCARDIOGRAM (TEE);  Surgeon: [name], MD;  Location: [facility];  Service: Cardiology;  Laterality: N/A;  . Minimally invasive maze procedure N/A 05/16/2013    Procedure: MINIMALLY INVASIVE MAZE PROCEDURE;  Surgeon: Rexene Alberts, MD;  Location: [facility];  Service: Open Heart Surgery;  Laterality: N/A;  . Intraoperative transesophageal echocardiogram N/A 05/16/2013    Procedure: INTRAOPERATIVE TRANSESOPHAGEAL ECHOCARDIOGRAM;  Surgeon: Rexene Alberts, MD;  Location: [place];  Service: Open Heart Surgery;  Laterality: N/A;  . Mitral valve replacement Right 05/16/2013    Procedure: MINIMALLY INVASIVE MITRAL VALVE (MV) REPLACEMENT;  Surgeon: Rexene Alberts, MD;  Location: [place];  Service: Open Heart Surgery;  Laterality: Right;  . Tee without cardioversion N/A 06/06/2013    Procedure: TRANSESOPHAGEAL ECHOCARDIOGRAM (TEE);  Surgeon: Larey Dresser, MD;  Location: [place];  Service: Cardiovascular;  Laterality: N/A;  . Cardioversion N/A 06/06/2013    Procedure: CARDIOVERSION;  Surgeon: Larey Dresser, MD;  Location: [facility] ENDOSCOPY;  Service: Cardiovascular;  Laterality: N/A;   I have reviewed the [place] and [facility] and  If appropriate update it with new information. Allergies  Allergen Reactions  . Indomethacin Other (See Comments)    dizziness  . Norvasc [Amlodipine Besylate] Cough   Scheduled Meds: . antiseptic oral rinse  7 mL Mouth Rinse BID  . cefTRIAXone (ROCEPHIN)  IV  1 g Intravenous Q24H  . colchicine  0.6 mg Oral BID  . oxybutynin  5 mg Oral Q2200  . simvastatin  10 mg Oral q1800  . sodium chloride  3 mL Intravenous Q12H  . traMADol  50 mg Oral Q12H  . warfarin  1 mg Oral Once per day on Mon Tue Thu Fri Sat   And  . warfarin  2 mg Oral Once per  day on Sun Wed  . Warfarin - Pharmacist Dosing Inpatient   Does not apply q1800   Continuous Infusions: . DOPamine 2 mcg/kg/min (10/08/13 0700)  . furosemide (LASIX) infusion 15 mg/hr (10/08/13 0700)   PRN Meds:.sodium chloride, acetaminophen, ondansetron (ZOFRAN) IV, oxyCODONE-acetaminophen, sodium chloride    BP 146/107  Pulse 67  Temp(Src) 98.2 F (36.8 C) (Oral)  Resp 23  Ht '5\' 2"'  (1.575 m)  Wt 81 kg (178 lb 9.2 oz)  BMI 32.65 kg/m2  SpO2 96%   PPS: 30%   Intake/Output Summary (Last 24 hours) at 10/08/13 1222 Last data filed at 10/08/13 1200  Gross per 24 hour  Intake  828.6 ml  Output   3090 ml  Net -2261.4 ml   LBM: 10/07/13  Physical Exam:  General: NAD, sitting in bed, pleasant affect  HEENT: [place]/AT, no JVD, moist mucous membranes  Chest: CTA throughout, no labored breathing, symmetric  CVS: RRR, S1 S2  Abdomen: Soft, NT, ND, +BS  Ext: MAE, no edema, warm to touch  Neuro: Awake, alert, oriented to person/place and somewhat to situation, easily distracted, follows commands   Labs: CBC    Component Value Date/Time   WBC 7.8 10/08/2013 0300   RBC 3.32* 10/08/2013 0300   HGB 8.9* 10/08/2013 0300   HCT 29.4* 10/08/2013 0300   PLT 199 10/08/2013 0300   MCV 88.6 10/08/2013 0300   MCH 26.8 10/08/2013 0300   MCHC 30.3 10/08/2013 0300   RDW 17.0* 10/08/2013 0300   LYMPHSABS 1.0 10/05/2013 2100   MONOABS 1.1* 10/05/2013 2100   EOSABS 0.1 10/05/2013 2100   BASOSABS 0.0 10/05/2013 2100    BMET    Component Value Date/Time   NA 138 10/08/2013 0300   K 4.8 10/08/2013 0300   CL 91* 10/08/2013 0300   CO2 36* 10/08/2013 0300   GLUCOSE 120* 10/08/2013 0300   BUN 39* 10/08/2013 0300   CREATININE 1.53* 10/08/2013 0300   CALCIUM 9.3 10/08/2013 0300   GFRNONAA 33* 10/08/2013 0300   GFRAA 38* 10/08/2013 0300    CMP     Component Value Date/Time   NA 138 10/08/2013 0300   K 4.8 10/08/2013 0300   CL 91* 10/08/2013 0300   CO2 36* 10/08/2013 0300   GLUCOSE 120* 10/08/2013 0300   BUN  39* 10/08/2013 0300   CREATININE 1.53* 10/08/2013 0300   CALCIUM 9.3 10/08/2013 0300   PROT 7.0 10/03/2013 1416   ALBUMIN 3.0* 10/03/2013 1416   AST 21 10/03/2013 1416   ALT 13 10/03/2013 1416   ALKPHOS 96 10/03/2013 1416   BILITOT 0.4 10/03/2013 1416   GFRNONAA 33* 10/08/2013 0300   GFRAA 38* 10/08/2013 0300      Time In Time Out Total Time Spent with Patient Total Overall Time  1130 1250 58mn 823m    Greater than 50%  of this time was spent counseling and coordinating care related to the above assessment and plan.   AlVinie SillNP Palliative Medicine Team Pager # [phone]-F 8a-5p) Team Phone # [phone]/Weekends)

## 2013-10-08 NOTE — Progress Notes (Signed)
ANTICOAGULATION CONSULT NOTE - Follow up [place] for Coumadin Indication: aflutter, bioprosthetic mitral valve  Allergies  Allergen Reactions  . Indomethacin Other (See Comments)    dizziness  . Norvasc [Amlodipine Besylate] Cough    Patient Measurements: Height: 5\' 2"  (157.5 cm) Weight: 178 lb 9.2 oz (81 kg) IBW/kg (Calculated) : 50.1  Vital Signs: Temp: 98.2 F (36.8 C) (08/03 1100) Temp src: Oral (08/03 1100) BP: 134/40 mmHg (08/03 1100) Pulse Rate: 67 (08/03 1100)  Labs:  Recent Labs  10/06/13 0406 10/07/13 0402 10/07/13 1330 10/08/13 0300  HGB 8.9* 8.4*  --  8.9*  HCT 28.6* 27.4*  --  29.4*  PLT 186 156  --  199  LABPROT 23.4* 25.1*  --   --   INR 2.08* 2.28*  --   --   CREATININE 1.77* 0.36* 0.58 1.53*    Estimated Creatinine Clearance: 32.8 ml/min (by C-G formula based on Cr of 1.53).  Assessment: [name] [name] on coumadin pta for aflutter, bioprosthetic mitral valve s/p DCCV 06/06/13, admitted for low output heart failure.  INR therapeutic yesterday at 2.28. H/H low but stable, plt wnl and stable with no bleeding reported.  Home dose is 2mg  Sun/Wed and 1mg  all other days  Goal of Therapy:  INR 2-3 Monitor platelets by anticoagulation protocol: Yes   Plan:  - Continue home dose of 1 mg daily except 2 mg on Sun/Wed - DC daily INR, recheck tomorrow am  - Continue to monitor CBC and s/s bleeding  Harolyn Rutherford, PharmD Clinical Pharmacist - Resident Pager: [phone] Pharmacy: [phone] 10/08/2013 11:57 AM

## 2013-10-08 NOTE — Progress Notes (Signed)
Full note to follow:  I met today with [name] [name], daughter, [name] [name], and sister and brother in [place]. I spent much time discussing her heart failure and the fact that this is not curable but a progressive disease. She tells me that her father and husband died of heart failure. She describes how her husband's doctor said "his heart will get weaker and weaker until it will kill him" but is unable to apply this to her situation. When I explained that this applies to her as well she tells me "so there's no hope for me?" and "so I'm just going to die?." I assured her that we all will die eventually but yes her heart disease will eventually cause her death. She is tearful but also tells me that she is ready when it is her time. She is a very religious person and says she is accepting whenever her time to die comes. We discussed code status and she decides DNR. They wish to continue treatment with Lasix and dopamine infusions, lab work, etc to optimize and her condition. I have discussed that we may not be able to make her condition much better as her renal function has been a problem. I will continue to follow and support and plan to discuss comfort measures and hospice options tomorrow.  Vinie Sill, NP Palliative Medicine Team Pager # [phone] (M-F 8a-5p) Team Phone # [phone] (Nights/Weekends)

## 2013-10-08 NOTE — Progress Notes (Signed)
Physical Therapy Treatment Patient Details Name: [name] [name] MRN: [id number] DOB: [birth year] Today's Date: 10/08/2013    History of Present Illness Adm 10/03/13 with CHF exacerbation, cardiogenic shock, and developed gout in bil feet. PMHx- CHF with EF 40%, CKD, CVA, PAF, MVR 04/2013    PT Comments    Pt appeared more frail today. Highly distracted. Comments off topic at times. Perseverating on need to use BSC, however needed cues to stay on task to get to [facility]. Daughter present and stated the MD talked to them about needing "rehab" on discharge. Pt does not have 24/7 assist at home and in light of worse performance today compared to 8/2, agree that SNF is most appropriate.   Follow Up Recommendations  SNF;Supervision/Assistance - 24 hour     Equipment Recommendations  None recommended by PT    Recommendations for Other Services       Precautions / Restrictions Precautions Precautions: Fall    Mobility  Bed Mobility Overal bed mobility: Needs Assistance Bed Mobility: Supine to Sit     Supine to sit: HOB elevated;Mod assist (with rail)     General bed mobility comments: HOB 30 (pt feels SOB if goes lower) with rail and incr time  Transfers Overall transfer level: Needs assistance Equipment used: Rolling walker (2 wheeled) Transfers: Sit to/from [facility] Sit to Stand: From elevated surface;Min guard Stand pivot transfers: Mod assist;Min assist       General transfer comment: daughter reports pt uses a lift chair at home; bed elevated to simulate; pt required incr time to [facility] with assist weight-shifting and tactile cues to advance feet; did better on return to bed from [facility]  Ambulation/Gait                 Stairs            Wheelchair Mobility    Modified Rankin (Stroke Patients Only)       Balance     Sitting balance-[name] Scale: Fair       Standing balance-[name] Scale: Poor                       Cognition Arousal/Alertness: Awake/alert Behavior During Therapy: WFL for tasks assessed/performed Overall Cognitive Status: Impaired/Different from baseline Area of Impairment: Attention;Following commands;Problem solving   Current Attention Level: Sustained   Following Commands: Follows one step commands with increased time     Problem Solving: Slow processing;Decreased initiation;Difficulty sequencing;Requires verbal cues;Requires tactile cues General Comments: distracted by noises "is that someone at the door" daughter on the phone, required frequent vc to stay on task; needed step by step cues (including which way foot and which way to step) as pivoting bed to [facility] to bed    Exercises General Exercises - Lower Extremity Ankle Circles/Pumps: AROM;15 reps Long Arc Quad: AROM;Both;5 reps    General Comments        Pertinent Vitals/Pain VSS on ICU monitor No pain in feet today (or elsewhere)    Home Living                      Prior Function            PT Goals (current goals can now be found in the care plan section) Acute Rehab PT Goals Patient Stated Goal: to return home soon    Frequency  Min 3X/week    PT Plan Discharge plan needs to be updated    Co-evaluation  End of Session Equipment Utilized During Treatment: Gait belt;Oxygen Activity Tolerance: Patient limited by fatigue Patient left: with call bell/phone within reach;in bed;with family/visitor present     Time: 9381-8299 PT Time Calculation (min): 56 min  Charges:  $Therapeutic Activity: 53-67 mins                    G Codes:      Kiernan Farkas 2013-10-10, 4:44 PM Pager [phone]

## 2013-10-09 ENCOUNTER — Inpatient Hospital Stay (HOSPITAL_COMMUNITY): Payer: Medicare HMO

## 2013-10-09 DIAGNOSIS — A409 Streptococcal sepsis, unspecified: Secondary | ICD-10-CM

## 2013-10-09 DIAGNOSIS — A411 Sepsis due to other specified staphylococcus: Secondary | ICD-10-CM

## 2013-10-09 DIAGNOSIS — I5023 Acute on chronic systolic (congestive) heart failure: Secondary | ICD-10-CM

## 2013-10-09 LAB — CARBOXYHEMOGLOBIN
Carboxyhemoglobin: 2 % — ABNORMAL HIGH (ref 0.5–1.5)
Methemoglobin: 0.6 % (ref 0.0–1.5)
O2 SAT: 71.7 %
TOTAL HEMOGLOBIN: 9 g/dL — AB (ref 12.0–16.0)

## 2013-10-09 LAB — CBC
HCT: 30.1 % — ABNORMAL LOW (ref 36.0–46.0)
Hemoglobin: 9.3 g/dL — ABNORMAL LOW (ref 12.0–15.0)
MCH: 27.3 pg (ref 26.0–34.0)
MCHC: 30.9 g/dL (ref 30.0–36.0)
MCV: 88.3 fL (ref 78.0–100.0)
PLATELETS: 219 10*3/uL (ref 150–400)
RBC: 3.41 MIL/uL — AB (ref 3.87–5.11)
RDW: 16.6 % — AB (ref 11.5–15.5)
WBC: 6.5 10*3/uL (ref 4.0–10.5)

## 2013-10-09 LAB — BASIC METABOLIC PANEL
Anion gap: 11 (ref 5–15)
BUN: 46 mg/dL — ABNORMAL HIGH (ref 6–23)
CALCIUM: 9.6 mg/dL (ref 8.4–10.5)
CO2: 40 meq/L — AB (ref 19–32)
Chloride: 84 mEq/L — ABNORMAL LOW (ref 96–112)
Creatinine, Ser: 1.83 mg/dL — ABNORMAL HIGH (ref 0.50–1.10)
GFR calc Af Amer: 31 mL/min — ABNORMAL LOW (ref >90)
GFR calc non Af Amer: 26 mL/min — ABNORMAL LOW (ref >90)
GLUCOSE: 144 mg/dL — AB (ref 70–99)
Potassium: 3.8 mEq/L (ref 3.7–5.3)
SODIUM: 135 meq/L — AB (ref 137–147)

## 2013-10-09 LAB — PROTIME-INR
INR: 1.87 — ABNORMAL HIGH (ref 0.00–1.49)
Prothrombin Time: 21.5 seconds — ABNORMAL HIGH (ref 11.6–15.2)

## 2013-10-09 LAB — CULTURE, BLOOD (ROUTINE X 2)

## 2013-10-09 LAB — GLUCOSE, CAPILLARY
GLUCOSE-CAPILLARY: 138 mg/dL — AB (ref 70–99)
Glucose-Capillary: 116 mg/dL — ABNORMAL HIGH (ref 70–99)
Glucose-Capillary: 108 mg/dL — ABNORMAL HIGH (ref 70–99)

## 2013-10-09 MED ORDER — WARFARIN SODIUM 2 MG PO TABS
2.0000 mg | ORAL_TABLET | Freq: Once | ORAL | Status: AC
  Administered 2013-10-09: 2 mg via ORAL
  Filled 2013-10-09: qty 1

## 2013-10-09 MED ORDER — AMIODARONE HCL 100 MG PO TABS
100.0000 mg | ORAL_TABLET | Freq: Every day | ORAL | Status: DC
  Administered 2013-10-09 – 2013-10-11 (×3): 100 mg via ORAL
  Filled 2013-10-09 (×3): qty 1

## 2013-10-09 MED ORDER — TORSEMIDE 20 MG PO TABS: 40.0000 mg | ORAL_TABLET | Freq: Every day | ORAL | Status: DC

## 2013-10-09 MED ORDER — SODIUM CHLORIDE 0.9 % IJ SOLN
10.0000 mL | INTRAMUSCULAR | Status: DC | PRN
  Administered 2013-10-10: 20 mL
  Administered 2013-10-10 – 2013-10-11 (×3): 10 mL

## 2013-10-09 MED ORDER — TORSEMIDE 20 MG PO TABS
60.0000 mg | ORAL_TABLET | Freq: Every day | ORAL | Status: DC
  Administered 2013-10-09: 60 mg via ORAL
  Filled 2013-10-09 (×2): qty 3

## 2013-10-09 MED ORDER — SODIUM CHLORIDE 0.9 % IJ SOLN
10.0000 mL | Freq: Two times a day (BID) | INTRAMUSCULAR | Status: DC
  Administered 2013-10-10: 10 mL

## 2013-10-09 NOTE — Progress Notes (Signed)
ANTICOAGULATION CONSULT NOTE - Follow up [place] for Coumadin Indication: aflutter, bioprosthetic mitral valve  Allergies  Allergen Reactions  . Indomethacin Other (See Comments)    dizziness  . Norvasc [Amlodipine Besylate] Cough    Patient Measurements: Height: 5\' 2"  (157.5 cm) Weight: 173 lb 4.5 oz (78.6 kg) IBW/kg (Calculated) : 50.1  Vital Signs: Temp: 97.7 F (36.5 C) (08/04 0800) Temp src: Oral (08/04 0800) BP: 127/44 mmHg (08/04 0800) Pulse Rate: 72 (08/04 0800)  Labs:  Recent Labs  10/07/13 0402 10/07/13 1330 10/08/13 0300 10/09/13 0400  HGB 8.4*  --  8.9* 9.3*  HCT 27.4*  --  29.4* 30.1*  PLT 156  --  199 219  LABPROT 25.1*  --   --  21.5*  INR 2.28*  --   --  1.87*  CREATININE 0.36* 0.58 1.53*  --     Estimated Creatinine Clearance: 32.3 ml/min (by C-G formula based on Cr of 1.53).  Assessment: 73yo F on coumadin pta for aflutter, bioprosthetic mitral valve s/p DCCV 06/06/13, admitted for low output heart failure.  INR trend down, now SUBtherapeutic at 1.87 with home dose since admit. H/H low but stable, plt wnl and stable with no bleeding reported.  Home dose is 2mg  Sun/Wed and 1mg  all other days  Goal of Therapy:  INR 2-3 Monitor platelets by anticoagulation protocol: Yes   Plan:  - Give increased dose of Coumadin 2 mg PO x 1 tonight - Daily INR  - Continue to monitor CBC and s/s bleeding  Harolyn Rutherford, PharmD Clinical Pharmacist - Resident Pager: [phone] Pharmacy: [phone] 10/09/2013 9:03 AM

## 2013-10-09 NOTE — Progress Notes (Signed)
CRITICAL VALUE ALERT  Critical value received:  CO2 = 40  Date of notification:  10/10/2013 Time of notification:  1101 Critical value read back:Yes.    Nurse who received alert:Pamala Hayman Landry Mellow RN  MD notified (1st page): [facility]  Time of first page:  *1105  MD notified (2nd page):  Time of second page:  Responding MD: Durene Cal MD  Time MD responded: [phone]

## 2013-10-09 NOTE — Progress Notes (Signed)
Chaplain responded to spiritual care consult. Pt's daughter discussed the pt's health with chaplain. Daughter expressed that they are currently focused on short-term goal of "managing her fluids," but that "her heart was weak." Pt's daughter also stated she is tired from caring from her mother and staying overnight. Daughter asked that chp keep family in her prayers.

## 2013-10-09 NOTE — Progress Notes (Signed)
R PICC pulled back 4cm per radiology. Bleeding at site, occlusive pressure dsg applied. Lorri Frederick, RN

## 2013-10-09 NOTE — Progress Notes (Signed)
[name], female   DOB: 01-29-1941, 73 y.o.   MRN: [id number]         [facility] for Infectious Disease    Date of Admission:  10/03/2013           Day 4 ceftriaxone  Principal Problem:   Streptococcal bacteremia Active Problems:   S/P MVR (mitral valve replacement)   Acute on chronic systolic heart failure   Acute on chronic renal failure   Congenital bicuspid aortic valve   . amiodarone  100 mg Oral Daily  . antiseptic oral rinse  7 mL Mouth Rinse BID  . cefTRIAXone (ROCEPHIN)  IV  1 g Intravenous Q24H  . colchicine  0.6 mg Oral BID  . oxybutynin  5 mg Oral Q2200  . simvastatin  10 mg Oral q1800  . sodium chloride  3 mL Intravenous Q12H  . torsemide  60 mg Oral Daily  . traMADol  50 mg Oral Q12H  . warfarin  2 mg Oral ONCE-1800  . Warfarin - Pharmacist Dosing Inpatient   Does not apply q1800    Subjective: She is feeling better today. She slept well last night. She has more energy today and a better appetite.   Past Medical History  Diagnosis Date  . Chronic airway obstruction, not elsewhere classified   . Precordial pain   . Swelling of limb   . Dizziness and giddiness   . Unspecified transient cerebral ischemia     Diag. 2003  . Acute, but ill-defined, cerebrovascular disease     2000 [place]  . Shortness of breath   . Obesity   . Juvenile rheumatic fever     age 46  . Unspecified essential hypertension   . Hyperlipidemia   . Mitral regurgitation 02/16/2013  . Chronic kidney disease   . CHF (congestive heart failure) 04/23/2013  . Paroxysmal atrial fibrillation 02/16/2013    Recurrent paroxysmal atrial fibrillation, first diagnosed December 2014  . Chronic diastolic congestive heart failure   . Arthritis     Chronic left knee pain  . Aortic insufficiency due to bicuspid aortic valve     moderate by TEE  . 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 S/P Maze operation for atrial fibrillation 05/16/2013    Complete bilateral atrial lesion set using cryothermy and bipolar radiofrequency ablation with oversewing of LA appendage    History  Substance Use Topics  . Smoking status: Never Smoker   . Smokeless tobacco: Never Used  . Alcohol Use: No    Family History  Problem Relation Age of Onset  . Heart failure Father   . Heart attack Brother     Allergies  Allergen Reactions  . Indomethacin Other (See Comments)    dizziness  . Norvasc [Amlodipine Besylate] Cough    Objective: Temp:  [97.7 F (36.5 C)-99 F (37.2 C)] 98 F (36.7 C) (08/04 1143) Pulse Rate:  [60-82] 60 (08/04 1143) Resp:  [12-22] 12 (08/04 1143) BP: (88-177)/(39-64) 88/56 mmHg (08/04 1143) SpO2:  [90 %-100 %] 99 % (08/04 1143) Weight:  [173 lb 4.5 oz (78.6 kg)] 173 lb 4.5 oz (78.6 kg) (08/04 0408)  General: She is more alert and talkative Skin: No rash Lungs: Clear Cor: Regular S1 and S2 with no murmur  Lab Results Lab Results  Component Value Date   WBC 6.5  10/09/2013   HGB 9.3* 10/09/2013   HCT 30.1* 10/09/2013   MCV 88.3 10/09/2013   PLT 219 10/09/2013    Lab Results  Component Value Date   CREATININE 1.83* 10/09/2013   BUN 46* 10/09/2013   NA 135* 10/09/2013   K 3.8 10/09/2013   CL 84* 10/09/2013   CO2 40* 10/09/2013    Lab Results  Component Value Date   ALT 13 10/03/2013   AST 21 10/03/2013   ALKPHOS 96 10/03/2013   BILITOT 0.4 10/03/2013      Microbiology: Recent Results (from the past 240 hour(s))  MRSA PCR SCREENING     Status: None   Collection Time    10/03/13  1:21 PM      Result Value Ref Range Status   MRSA by PCR NEGATIVE  NEGATIVE Final   Comment:            The GeneXpert MRSA Assay (FDA     approved for NASAL specimens     only), is one component of a     comprehensive MRSA colonization     surveillance program. It is not     intended to diagnose MRSA      infection nor to guide or     monitor treatment for     MRSA infections.  URINE CULTURE     Status: None   Collection Time    10/05/13  9:02 PM      Result Value Ref Range Status   Specimen Description URINE, CATHETERIZED   Final   Special Requests Normal   Final   Culture  Setup Time     Final   Value: 10/06/2013 01:30     Performed at [facility]     Final   Value: NO GROWTH     Performed at [facility]   Culture     Final   Value: NO GROWTH     Performed at [facility]   Report Status 10/07/2013 FINAL   Final  CULTURE, BLOOD (ROUTINE X 2)     Status: None   Collection Time    10/05/13  9:30 PM      Result Value Ref Range Status   Specimen Description BLOOD LEFT ANTECUBITAL   Final   Special Requests     Final   Value: BOTTLES DRAWN AEROBIC AND ANAEROBIC 10CC AER 6CC ANA   Culture  Setup Time 10/06/2013 01:11   Final   Culture     Final   Value: VIRIDANS STREPTOCOCCUS     STAPHYLOCOCCUS SPECIES (COAGULASE NEGATIVE)     Note: RIFAMPIN AND GENTAMICIN SHOULD NOT BE USED AS SINGLE DRUGS FOR TREATMENT OF STAPH INFECTIONS.     Note: Gram Stain Report Called to,Read Back By and Verified With: [place] CRAVEN 10/06/13 @ 8:02PM BY RUSCOE A.   Report Status 10/09/2013 FINAL   Final   Organism ID, Bacteria VIRIDANS STREPTOCOCCUS   Final   Organism ID, Bacteria STAPHYLOCOCCUS SPECIES (COAGULASE NEGATIVE)   Final  CULTURE, BLOOD (ROUTINE X 2)     Status: None   Collection Time    10/05/13  9:36 PM      Result Value Ref Range Status   Specimen Description BLOOD RIGHT HAND   Final   Special Requests BOTTLES DRAWN AEROBIC AND ANAEROBIC 10CC   Final   Culture  Setup Time     Final   Value: 10/06/2013 01:11     Performed at  [facility]     Final   Value:        BLOOD CULTURE RECEIVED NO GROWTH TO DATE CULTURE WILL BE HELD FOR 5 DAYS BEFORE ISSUING A FINAL NEGATIVE REPORT     Performed at [facility]   Report Status  PENDING   Incomplete  CULTURE, BLOOD (ROUTINE X 2)     Status: None   Collection Time    10/06/13 10:21 PM      Result Value Ref Range Status   Specimen Description BLOOD LEFT ANTECUBITAL   Final   Special Requests BOTTLES DRAWN AEROBIC AND ANAEROBIC 10CC EA   Final   Culture  Setup Time     Final   Value: 10/07/2013 00:57     Performed at [facility]   Culture     Final   Value:        BLOOD CULTURE RECEIVED NO GROWTH TO DATE CULTURE WILL BE HELD FOR 5 DAYS BEFORE ISSUING A FINAL NEGATIVE REPORT     Performed at [facility]   Report Status PENDING   Incomplete  CULTURE, BLOOD (ROUTINE X 2)     Status: None   Collection Time    10/06/13 10:32 PM      Result Value Ref Range Status   Specimen Description BLOOD RIGHT ANTECUBITAL   Final   Special Requests BOTTLES DRAWN AEROBIC AND ANAEROBIC 5CC EA   Final   Culture  Setup Time     Final   Value: 10/07/2013 00:57     Performed at [facility]   Culture     Final   Value:        BLOOD CULTURE RECEIVED NO GROWTH TO DATE CULTURE WILL BE HELD FOR 5 DAYS BEFORE ISSUING A FINAL NEGATIVE REPORT     Performed at [facility]   Report Status PENDING   Incomplete  CULTURE, BLOOD (ROUTINE X 2)     Status: None   Collection Time    10/08/13  2:02 PM      Result Value Ref Range Status   Specimen Description BLOOD LEFT ANTECUBITAL   Final   Special Requests BOTTLES DRAWN AEROBIC AND ANAEROBIC 10CC   Final   Culture  Setup Time     Final   Value: 10/08/2013 17:02     Performed at [facility]   Culture     Final   Value:        BLOOD CULTURE RECEIVED NO GROWTH TO DATE CULTURE WILL BE HELD FOR 5 DAYS BEFORE ISSUING A FINAL NEGATIVE REPORT     Performed at [facility]   Report Status PENDING   Incomplete  CULTURE, BLOOD (ROUTINE X 2)     Status: None   Collection Time    10/08/13  2:10 PM      Result Value Ref Range Status   Specimen Description BLOOD LEFT HAND   Final   Special Requests  BOTTLES DRAWN AEROBIC AND ANAEROBIC 10CC   Final   Culture  Setup Time     Final   Value: 10/08/2013 17:02     Performed at [facility]   Culture     Final   Value:        BLOOD CULTURE RECEIVED NO GROWTH TO DATE CULTURE WILL BE HELD FOR 5 DAYS BEFORE ISSUING A FINAL NEGATIVE REPORT     Performed at [facility]   Report Status  PENDING   Incomplete    Studies/Results: Dg Chest 2 View  10/08/2013   CLINICAL DATA:  Pneumonia  EXAM: CHEST  2 VIEW  COMPARISON:  October 05, 2013 and June 04, 2013  FINDINGS: There is underlying emphysematous change. There is scarring in both lower lobes, more on the right than on the left. Stable. There is generalized interstitial prominence which probably reflects chronic inflammatory type change. Note that there may be some mild chronic congestive heart failure superimposed. Heart is enlarged with pulmonary vascularity within normal limits. There is a mitral valve replacement present. Central catheter tip is in the superior vena cava. No pneumothorax. There are surgical clips in the left neck region. There is degenerative change in the thoracic spine.  IMPRESSION: Underlying emphysema. Probable chronic inflammatory change in the lungs, although a degree of chronic congestive heart failure cannot be excluded. There is no airspace consolidation. There is stable cardiomegaly with a mitral valve prosthesis present.   Electronically Signed   By: Lowella Grip M.D.   On: 10/08/2013 13:53    Assessment: One of 2 blood cultures obtained when she had her fever spike has grown a penicillin sensitive Viridans strep and methicillin sensitive coagulase-negative staph. Both of these can be treated with IV ceftriaxone. I agree with holding off on TEE. She and her daughters want to continue with medical therapy she will have a PICC placed. She will need at least 2 weeks of IV ceftriaxone. She is in agreement with that plan.   Plan: 1. PICC placement 2. Continue  ceftriaxone at least 10 more days  Michel Bickers, MD [facility] for [place] [phone] pager   [phone] cell 10/09/2013, 3:23 PM

## 2013-10-09 NOTE — Progress Notes (Signed)
Peripherally Inserted Central Catheter/Midline Placement  The IV Nurse has discussed with the patient and/or persons authorized to consent for the patient, the purpose of this procedure and the potential benefits and risks involved with this procedure.  The benefits include less needle sticks, lab draws from the catheter and patient may be discharged home with the catheter.  Risks include, but not limited to, infection, bleeding, blood clot (thrombus formation), and puncture of an artery; nerve damage and irregular heat beat.  Alternatives to this procedure were also discussed.  PICC/Midline Placement Documentation        Darlyn Read 10/09/2013, 4:53 PM

## 2013-10-09 NOTE — Progress Notes (Signed)
Progress Note from the Palliative Medicine Team at [facility]  Subjective: I spoke today with [name] [name] and her daughter and sister in room. We continued our discussion from yesterday and I spoke with her about options for comfort, hospice at home, and rehospitalization. We discussed the consideration of quality of life and our medical limitations to help with her heart failure. She tells me "I'm not going anywhere until it's my time." She talks about having funeral arrangements partially made and has written this out for her daughter. I acknowledge this is important but it is equally important to make sure she is getting the most out of what is left of her life and to consider if she wishes to spend this time in the hospital. She nods her head "yes" throughout conversation but she also spends much time off subject and deflecting the conversation. She often changes the subject. Daughter - [name] [name] understands and believes hospice may be of benefit to her mother. I can get no conversation or opinions from [name] [name] except for "I'm not going until it's my time" and "I'm okay with dying but I don't want to leave my family." She acknowledges that she wishes to pursue [facility] for rehab and wishes to optimize her strength and independence. I will continue to follow and support [name] [name] and discuss her readiness for hospice after rehab.    Objective: Allergies  Allergen Reactions  . Indomethacin Other (See Comments)    dizziness  . Norvasc [Amlodipine Besylate] Cough   Scheduled Meds: . amiodarone  100 mg Oral Daily  . antiseptic oral rinse  7 mL Mouth Rinse BID  . cefTRIAXone (ROCEPHIN)  IV  1 g Intravenous Q24H  . colchicine  0.6 mg Oral BID  . oxybutynin  5 mg Oral Q2200  . simvastatin  10 mg Oral q1800  . sodium chloride  10-40 mL Intracatheter Q12H  . sodium chloride  3 mL Intravenous Q12H  . torsemide  60 mg Oral Daily  . traMADol  50 mg Oral Q12H  . warfarin  2 mg Oral ONCE-1800   . Warfarin - Pharmacist Dosing Inpatient   Does not apply q1800   Continuous Infusions:  PRN Meds:.sodium chloride, acetaminophen, ondansetron (ZOFRAN) IV, oxyCODONE-acetaminophen, sodium chloride, sodium chloride  BP 88/56  Pulse 60  Temp(Src) 98 F (36.7 C) (Oral)  Resp 12  Ht 5\' 2"  (1.575 m)  Wt 78.6 kg (173 lb 4.5 oz)  BMI 31.69 kg/m2  SpO2 99%   PPS: 40%     Intake/Output Summary (Last 24 hours) at 10/09/13 1717 Last data filed at 10/09/13 0600  Gross per 24 hour  Intake  489.6 ml  Output   1050 ml  Net -560.4 ml      LBM: 10/10/13      Physical Exam:   General: NAD, sitting in bed, pleasant affect HEENT:  [place]/AT, no JVD, moist mucous membranes Chest: CTA throughout, no labored breathing, symmetric CVS: RRR, S1 S2 Abdomen: Soft, NT, ND, +BS Ext: MAE, no edema, warm to touch Neuro: Awake, alert, oriented x 3 but easily distracted, follows commands  Labs: CBC    Component Value Date/Time   WBC 6.5 10/09/2013 0400   RBC 3.41* 10/09/2013 0400   HGB 9.3* 10/09/2013 0400   HCT 30.1* 10/09/2013 0400   PLT 219 10/09/2013 0400   MCV 88.3 10/09/2013 0400   MCH 27.3 10/09/2013 0400   MCHC 30.9 10/09/2013 0400   RDW 16.6* 10/09/2013 0400   LYMPHSABS 1.0  10/05/2013 2100   MONOABS 1.1* 10/05/2013 2100   EOSABS 0.1 10/05/2013 2100   BASOSABS 0.0 10/05/2013 2100    BMET    Component Value Date/Time   NA 135* 10/09/2013 0930   K 3.8 10/09/2013 0930   CL 84* 10/09/2013 0930   CO2 40* 10/09/2013 0930   GLUCOSE 144* 10/09/2013 0930   BUN 46* 10/09/2013 0930   CREATININE 1.83* 10/09/2013 0930   CALCIUM 9.6 10/09/2013 0930   GFRNONAA 26* 10/09/2013 0930   GFRAA 31* 10/09/2013 0930    CMP     Component Value Date/Time   NA 135* 10/09/2013 0930   K 3.8 10/09/2013 0930   CL 84* 10/09/2013 0930   CO2 40* 10/09/2013 0930   GLUCOSE 144* 10/09/2013 0930   BUN 46* 10/09/2013 0930   CREATININE 1.83* 10/09/2013 0930   CALCIUM 9.6 10/09/2013 0930   PROT 7.0 10/03/2013 1416   ALBUMIN 3.0* 10/03/2013 1416   AST 21  10/03/2013 1416   ALT 13 10/03/2013 1416   ALKPHOS 96 10/03/2013 1416   BILITOT 0.4 10/03/2013 1416   GFRNONAA 26* 10/09/2013 0930   GFRAA 31* 10/09/2013 0930     Assessment and Plan: 1. Code Status: DNR 2. Symptom Control: 1. Pain: Acetaminophen prn. Tramadol scheduled. Oxycodone-acetaminophen 5-325 mg every 4 hours prn. Colchicine for gout pain. 2. Nausea: Ondansetron prn.  3. Weakness: Continue medical management. PT eval.  3. Psycho/Social: Emotional support provided to patient and family.  4. Disposition: SNF rehab.     Time In Time Out Total Time Spent with Patient Total Overall Time  1230 1330 77min 59min    Greater than 50%  of this time was spent counseling and coordinating care related to the above assessment and plan.  Vinie Sill, NP Palliative Medicine Team Pager # [phone] (M-F 8a-5p) Team Phone # [phone] (Nights/Weekends)

## 2013-10-09 NOTE — Progress Notes (Addendum)
[name], female   DOB: Apr 27, 1940, 73 y.o.   MRN: [id number] Advanced Heart Failure Rounding Note   Subjective:   [name] [name] is a 73 yo obese white female with known history of rheumatic fever during childhood and long standing history of chronic diastolic congestive heart failure. She also has a history of CKD, HTN , CVA 2003,, and PAF. 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 Was discharged 05/31/13 to [facility] center and weight was 207 lbs. Pre-operative LHC in 2/15 showed no significant CAD.   Prior to admit amiodarone and coreg stopped due to bradycardia. Admitted 7/28 with suspected low output. Swan placed.  Diuretics held initially.  Patient was noted to be volume overloaded with elevated creatinine.  She was started on dopamine and IV Lasix.  Weight down 5 lbs and 24 hr I/O -1.8 liters. Met with Palliative Care yesterday and now DNR and will discuss comfort care/hospice today. Denies SOB, orthopnea or CP. Not very mobile and PT recommends SNF or 24 hr care. Blood cultures pending. Remains on dopamine and lasix gtt.   CVP 7 Co-ox 72% Creatinine 3.29>2.93>2.56>2.0>1.77>0.36 (suspect inaccurate)> 1.5 Objective:   Weight Range:  Vital Signs:   Temp:  [97.9 F (36.6 C)-99 F (37.2 C)] 97.9 F (36.6 C) (08/04 0408) Pulse Rate:  [61-82] 71 (08/04 0408) Resp:  [13-23] 18 (08/04 0408) BP: (89-177)/(33-107) 155/64 mmHg (08/04 0408) SpO2:  [90 %-100 %] 96 % (08/04 0408) Weight:  [173 lb 4.5 oz (78.6 kg)] 173 lb 4.5 oz (78.6 kg) (08/04 0408) Last BM Date: 10/08/13  Weight change: Filed Weights   10/07/13 0400 10/08/13 0500 10/09/13 0408  Weight: 185 lb 10 oz (84.2 kg) 178 lb 9.2 oz (81 kg) 173 lb 4.5 oz (78.6 kg)    Intake/Output:   Intake/Output Summary (Last 24 hours) at 10/09/13 0759 Last data filed at 10/09/13 0600   Gross per 24 hour  Intake 1121.6 ml  Output   2980 ml  Net -1858.4 ml     Physical Exam: CVP 7 General:  Chronically ill appearing. No resp difficulty, lying in bed HEENT: normal Neck: supple. JVP flat . Carotids 2+ bilat; no bruits. No lymphadenopathy or thryomegaly appreciated. R neck swan  Cor: PMI nondisplaced. Bradycardic Regular rate & rhythm. No rubs, gallops or murmurs. Trace edema. Lungs: clear Abdomen: soft, nontender, nondistended. No hepatosplenomegaly. No bruits or masses. Good bowel sounds. Extremities: no cyanosis, clubbing, rash.  No redness around toe joints despite pain.  Neuro: alert & orientedx3, cranial nerves grossly intact. moves all 4 extremities w/o difficulty. Affect pleasant GU: Foley   Telemetry: SR 70s  Labs: Basic Metabolic Panel:  Recent Labs Lab 10/03/13 1416  10/05/13 0500 10/06/13 0406 10/07/13 0402 10/07/13 1330 10/08/13 0300  NA 141  < > 140 137 135* 134* 138  K 5.6*  < > 4.0 4.3 3.9 4.1 4.8  CL 101  < > 100 94* 93* 90* 91*  CO2 28  < > 31 32 31 34* 36*  GLUCOSE 99  < > 114* 97 161* 148* 120*  BUN 43*  < > 35* 32* 35* 36* 39*  CREATININE 2.93*  < > 2.02* 1.77* 0.36* 0.58 1.53*  CALCIUM 9.3  < > 8.9 9.2 8.1* 9.0 9.3  MG 2.6*  --   --   --   --   --   --   < > = values  in this interval not displayed.  Liver Function Tests:  Recent Labs Lab 10/03/13 1416  AST 21  ALT 13  ALKPHOS 96  BILITOT 0.4  PROT 7.0  ALBUMIN 3.0*   No results found for this basename: LIPASE, AMYLASE,  in the last 168 hours No results found for this basename: AMMONIA,  in the last 168 hours  CBC:  Recent Labs Lab 10/05/13 2100 10/06/13 0406 10/07/13 0402 10/08/13 0300 10/09/13 0400  WBC 8.5 8.4 8.4 7.8 6.5  NEUTROABS 6.3  --   --   --   --   HGB 9.6* 8.9* 8.4* 8.9* 9.3*  HCT 30.6* 28.6* 27.4* 29.4* 30.1*  MCV 89.5 88.0 89.5 88.6 88.3  PLT 175 186 156 199 219    Cardiac Enzymes:  Recent Labs Lab 10/03/13 1416 10/03/13 1916 10/04/13 0550   TROPONINI <0.30 <0.30 <0.30    BNP: BNP (last 3 results)  Recent Labs  07/18/13 1107 08/28/13 1418 10/02/13 1134  PROBNP 1514.0* 1518.0* 2909.0*    Imaging: Dg Chest 2 View  10/08/2013   CLINICAL DATA:  Pneumonia  EXAM: CHEST  2 VIEW  COMPARISON:  October 05, 2013 and June 04, 2013  FINDINGS: There is underlying emphysematous change. There is scarring in both lower lobes, more on the right than on the left. Stable. There is generalized interstitial prominence which probably reflects chronic inflammatory type change. Note that there may be some mild chronic congestive heart failure superimposed. Heart is enlarged with pulmonary vascularity within normal limits. There is a mitral valve replacement present. Central catheter tip is in the superior vena cava. No pneumothorax. There are surgical clips in the left neck region. There is degenerative change in the thoracic spine.  IMPRESSION: Underlying emphysema. Probable chronic inflammatory change in the lungs, although a degree of chronic congestive heart failure cannot be excluded. There is no airspace consolidation. There is stable cardiomegaly with a mitral valve prosthesis present.   Electronically Signed   By: [name] [name] M.D.   On: 10/08/2013 13:53     Medications:     Scheduled Medications: . antiseptic oral rinse  7 mL Mouth Rinse BID  . cefTRIAXone (ROCEPHIN)  IV  1 g Intravenous Q24H  . colchicine  0.6 mg Oral BID  . oxybutynin  5 mg Oral Q2200  . simvastatin  10 mg Oral q1800  . sodium chloride  3 mL Intravenous Q12H  . traMADol  50 mg Oral Q12H  . warfarin  1 mg Oral Once per day on Mon Tue Thu Fri Sat   And  . warfarin  2 mg Oral Once per day on Sun Wed  . Warfarin - Pharmacist Dosing Inpatient   Does not apply q1800    Infusions: . DOPamine 2 mcg/kg/min (10/08/13 0700)  . furosemide (LASIX) infusion 15 mg/hr (10/08/13 2309)    PRN Medications: sodium chloride, acetaminophen, ondansetron (ZOFRAN) IV,  oxyCODONE-acetaminophen, sodium chloride   Assessment:  1) AKI  2) Cardiogenic shock: Echo (3/15) with EF 40-45%, mildly dilated RV with mildly decreased systolic function, bioprosthetic mitral valve.  Echo (7/15) with EF 50-55%, moderate AI, bioprosthetic MV ok.  - suspect low output  3) severe MR: Minimally invasive Maze procedure with MV replacement (05/16/13)  4) PAF  5) Sinus Brady- no bb 6) Suspected gout: foot pain, elevated uric acid.  7) Strep viridans bacteremia: no vegetation seen on TTE this admission.  8) DNR  Plan/Discussion:    Volume status much improved, CVP 7. Will stop lasix  gtt and dopamine and plan to transition to PO diuretics. Will need to follow renal function closely with discontinuation of dopamine. Concerned that it may start trending up and she will start retaining fluid again. Needs BMET today (was not drawn earlier). She has been very difficult to manage at home and agree with her being DNR.   Blood cultures + with strep viridans. ID on board and appreciate help. She remains on rocephin currently.  No endocarditis seen on TTE.  CXR yesterday did not show PNA.  At this point, will hold off on TEE.  Will place PICC for IV abx.   Palliative care to meet with family again today about comfort care vs hospice. She has great family support and if goes home will need 24 hour care since she is very debilitated.  She will need to go to [facility].  Continue PT.   Rande Brunt 10/09/2013 7:59 AM  Patient seen with NP, agree with the above note.  CVP 7, stop Lasix gtt and dopamine today.  Will try to get her on a stable dose of torsemide 60 mg daily without metolazone for now.  Will restart her on a low dose of amiodarone, 100 mg daily. Stop if she is significantly bradycardic.  Will leave off Coreg.  She can go to the floor today, possibly to [facility] in am if stable.  Tenuous status with significant cardiorenal syndrome.  Agree with DNR and hospice discussion.    [name] 10/09/2013 9:16 AM

## 2013-10-09 NOTE — Progress Notes (Signed)
UR Completed.  [name] [name] Jane [phone] 10/09/2013  

## 2013-10-10 LAB — CARBOXYHEMOGLOBIN
Carboxyhemoglobin: 1.7 % — ABNORMAL HIGH (ref 0.5–1.5)
Methemoglobin: 0.5 % (ref 0.0–1.5)
O2 Saturation: 68.2 %
TOTAL HEMOGLOBIN: 8.9 g/dL — AB (ref 12.0–16.0)

## 2013-10-10 LAB — BASIC METABOLIC PANEL
ANION GAP: 10 (ref 5–15)
BUN: 54 mg/dL — ABNORMAL HIGH (ref 6–23)
CO2: 39 meq/L — AB (ref 19–32)
CREATININE: 2.44 mg/dL — AB (ref 0.50–1.10)
Calcium: 9.3 mg/dL (ref 8.4–10.5)
Chloride: 85 mEq/L — ABNORMAL LOW (ref 96–112)
GFR calc Af Amer: 22 mL/min — ABNORMAL LOW (ref >90)
GFR calc non Af Amer: 19 mL/min — ABNORMAL LOW (ref >90)
Glucose, Bld: 91 mg/dL (ref 70–99)
Potassium: 4 mEq/L (ref 3.7–5.3)
SODIUM: 134 meq/L — AB (ref 137–147)

## 2013-10-10 LAB — PROTIME-INR
INR: 1.96 — ABNORMAL HIGH (ref 0.00–1.49)
PROTHROMBIN TIME: 22.3 s — AB (ref 11.6–15.2)

## 2013-10-10 MED ORDER — DEXTROSE 5 % IV SOLN: 2.0000 g | INTRAVENOUS | Status: DC

## 2013-10-10 MED ORDER — WARFARIN SODIUM 2 MG PO TABS
2.0000 mg | ORAL_TABLET | Freq: Once | ORAL | Status: AC
  Administered 2013-10-10: 2 mg via ORAL
  Filled 2013-10-10: qty 1

## 2013-10-10 MED ORDER — DEXTROSE 5 % IV SOLN
2.0000 g | INTRAVENOUS | Status: DC
  Administered 2013-10-11: 2 g via INTRAVENOUS
  Filled 2013-10-10: qty 2

## 2013-10-10 NOTE — Progress Notes (Signed)
[name], female   DOB: 09-Jul-1940, 73 y.o.   MRN: [id number]         [facility] for Infectious Disease    Date of Admission:  10/03/2013           Day 5 ceftriaxone  Principal Problem:   Streptococcal bacteremia Active Problems:   S/P MVR (mitral valve replacement)   Acute on chronic systolic heart failure   Acute on chronic renal failure   Congenital bicuspid aortic valve   . amiodarone  100 mg Oral Daily  . antiseptic oral rinse  7 mL Mouth Rinse BID  . [START ON 10/11/2013] cefTRIAXone (ROCEPHIN)  IV  2 g Intravenous Q24H  . colchicine  0.6 mg Oral BID  . oxybutynin  5 mg Oral Q2200  . simvastatin  10 mg Oral q1800  . sodium chloride  10-40 mL Intracatheter Q12H  . sodium chloride  3 mL Intravenous Q12H  . traMADol  50 mg Oral Q12H  . warfarin  2 mg Oral ONCE-1800  . Warfarin - Pharmacist Dosing Inpatient   Does not apply q1800    Subjective: She is feeling better today.    Past Medical History  Diagnosis Date  . Chronic airway obstruction, not elsewhere classified   . Precordial pain   . Swelling of limb   . Dizziness and giddiness   . Unspecified transient cerebral ischemia     Diag. 2003  . Acute, but ill-defined, cerebrovascular disease     2000 [place]  . Shortness of breath   . Obesity   . Juvenile rheumatic fever     age 50  . Unspecified essential hypertension   . Hyperlipidemia   . Mitral regurgitation 02/16/2013  . Chronic kidney disease   . CHF (congestive heart failure) 04/23/2013  . Paroxysmal atrial fibrillation 02/16/2013    Recurrent paroxysmal atrial fibrillation, first diagnosed December 2014  . Chronic diastolic congestive heart failure   . Arthritis     Chronic left knee pain  . Aortic insufficiency due to bicuspid aortic valve     moderate by TEE  . 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 S/P Maze operation for atrial fibrillation 05/16/2013    Complete bilateral atrial lesion set using cryothermy and bipolar radiofrequency ablation with oversewing of LA appendage    History  Substance Use Topics  . Smoking status: Never Smoker   . Smokeless tobacco: Never Used  . Alcohol Use: No    Family History  Problem Relation Age of Onset  . Heart failure Father   . Heart attack Brother     Allergies  Allergen Reactions  . Indomethacin Other (See Comments)    dizziness  . Norvasc [Amlodipine Besylate] Cough    Objective: Temp:  [97.7 F (36.5 C)-98.2 F (36.8 C)] 97.7 F (36.5 C) (08/05 1357) Pulse Rate:  [59-63] 60 (08/05 1357) Resp:  [16-17] 17 (08/05 1357) BP: (106-126)/(42-44) 109/42 mmHg (08/05 1357) SpO2:  [97 %-98 %] 97 % (08/05 1357) Weight:  [173 lb 6.4 oz (78.654 kg)] 173 lb 6.4 oz (78.654 kg) (08/04 2005)  General: She is more alert and talkative. No family are present Skin: No rash Lungs: Clear Cor: Regular S1 and S2 with no murmur  Lab Results Lab Results  Component Value Date   WBC 6.5 10/09/2013   HGB 9.3*  10/09/2013   HCT 30.1* 10/09/2013   MCV 88.3 10/09/2013   PLT 219 10/09/2013    Lab Results  Component Value Date   CREATININE 2.44* 10/10/2013   BUN 54* 10/10/2013   NA 134* 10/10/2013   K 4.0 10/10/2013   CL 85* 10/10/2013   CO2 39* 10/10/2013    Lab Results  Component Value Date   ALT 13 10/03/2013   AST 21 10/03/2013   ALKPHOS 96 10/03/2013   BILITOT 0.4 10/03/2013      Microbiology: Recent Results (from the past 240 hour(s))  MRSA PCR SCREENING     Status: None   Collection Time    10/03/13  1:21 PM      Result Value Ref Range Status   MRSA by PCR NEGATIVE  NEGATIVE Final   Comment:            The GeneXpert MRSA Assay (FDA     approved for NASAL specimens     only), is one component of a     comprehensive MRSA colonization     surveillance program. It is not     intended to diagnose MRSA      infection nor to guide or     monitor treatment for     MRSA infections.  URINE CULTURE     Status: None   Collection Time    10/05/13  9:02 PM      Result Value Ref Range Status   Specimen Description URINE, CATHETERIZED   Final   Special Requests Normal   Final   Culture  Setup Time     Final   Value: 10/06/2013 01:30     Performed at [facility]     Final   Value: NO GROWTH     Performed at [facility]   Culture     Final   Value: NO GROWTH     Performed at [facility]   Report Status 10/07/2013 FINAL   Final  CULTURE, BLOOD (ROUTINE X 2)     Status: None   Collection Time    10/05/13  9:30 PM      Result Value Ref Range Status   Specimen Description BLOOD LEFT ANTECUBITAL   Final   Special Requests     Final   Value: BOTTLES DRAWN AEROBIC AND ANAEROBIC 10CC AER 6CC ANA   Culture  Setup Time 10/06/2013 01:11   Final   Culture     Final   Value: VIRIDANS STREPTOCOCCUS     STAPHYLOCOCCUS SPECIES (COAGULASE NEGATIVE)     Note: RIFAMPIN AND GENTAMICIN SHOULD NOT BE USED AS SINGLE DRUGS FOR TREATMENT OF STAPH INFECTIONS.     Note: Gram Stain Report Called to,Read Back By and Verified With: [place] CRAVEN 10/06/13 @ 8:02PM BY RUSCOE A.   Report Status 10/09/2013 FINAL   Final   Organism ID, Bacteria VIRIDANS STREPTOCOCCUS   Final   Organism ID, Bacteria STAPHYLOCOCCUS SPECIES (COAGULASE NEGATIVE)   Final  CULTURE, BLOOD (ROUTINE X 2)     Status: None   Collection Time    10/05/13  9:36 PM      Result Value Ref Range Status   Specimen Description BLOOD RIGHT HAND   Final   Special Requests BOTTLES DRAWN AEROBIC AND ANAEROBIC 10CC   Final   Culture  Setup Time     Final   Value: 10/06/2013 01:11     Performed at [facility]  Culture     Final   Value:        BLOOD CULTURE RECEIVED NO GROWTH TO DATE CULTURE WILL BE HELD FOR 5 DAYS BEFORE ISSUING A FINAL NEGATIVE REPORT     Performed at [facility]   Report Status  PENDING   Incomplete  CULTURE, BLOOD (ROUTINE X 2)     Status: None   Collection Time    10/06/13 10:21 PM      Result Value Ref Range Status   Specimen Description BLOOD LEFT ANTECUBITAL   Final   Special Requests BOTTLES DRAWN AEROBIC AND ANAEROBIC 10CC EA   Final   Culture  Setup Time     Final   Value: 10/07/2013 00:57     Performed at [facility]   Culture     Final   Value:        BLOOD CULTURE RECEIVED NO GROWTH TO DATE CULTURE WILL BE HELD FOR 5 DAYS BEFORE ISSUING A FINAL NEGATIVE REPORT     Performed at [facility]   Report Status PENDING   Incomplete  CULTURE, BLOOD (ROUTINE X 2)     Status: None   Collection Time    10/06/13 10:32 PM      Result Value Ref Range Status   Specimen Description BLOOD RIGHT ANTECUBITAL   Final   Special Requests BOTTLES DRAWN AEROBIC AND ANAEROBIC 5CC EA   Final   Culture  Setup Time     Final   Value: 10/07/2013 00:57     Performed at [facility]   Culture     Final   Value:        BLOOD CULTURE RECEIVED NO GROWTH TO DATE CULTURE WILL BE HELD FOR 5 DAYS BEFORE ISSUING A FINAL NEGATIVE REPORT     Performed at [facility]   Report Status PENDING   Incomplete  CULTURE, BLOOD (ROUTINE X 2)     Status: None   Collection Time    10/08/13  2:02 PM      Result Value Ref Range Status   Specimen Description BLOOD LEFT ANTECUBITAL   Final   Special Requests BOTTLES DRAWN AEROBIC AND ANAEROBIC 10CC   Final   Culture  Setup Time     Final   Value: 10/08/2013 17:02     Performed at [facility]   Culture     Final   Value:        BLOOD CULTURE RECEIVED NO GROWTH TO DATE CULTURE WILL BE HELD FOR 5 DAYS BEFORE ISSUING A FINAL NEGATIVE REPORT     Performed at [facility]   Report Status PENDING   Incomplete  CULTURE, BLOOD (ROUTINE X 2)     Status: None   Collection Time    10/08/13  2:10 PM      Result Value Ref Range Status   Specimen Description BLOOD LEFT HAND   Final   Special Requests  BOTTLES DRAWN AEROBIC AND ANAEROBIC 10CC   Final   Culture  Setup Time     Final   Value: 10/08/2013 17:02     Performed at [facility]   Culture     Final   Value:        BLOOD CULTURE RECEIVED NO GROWTH TO DATE CULTURE WILL BE HELD FOR 5 DAYS BEFORE ISSUING A FINAL NEGATIVE REPORT     Performed at [facility]   Report Status PENDING   Incomplete  Studies/Results: Dg Chest Port 1 View  10/09/2013   CLINICAL DATA:  PICC placement.  EXAM: PORTABLE CHEST - 1 VIEW  COMPARISON:  10/08/2013  FINDINGS: PICC tip is 9.2 cm below the carina in the right atrium and could be retracted 4-5 cm. Central venous sheath in the superior vena cava at the level of the carina.  There is persistent cardiomegaly with a improved hazy infiltrate in the right lung. There is chronic accentuation of the markings on the left. Prosthetic valve noted.  IMPRESSION: PICC tip is in the right atrium and could be retracted 4-5 cm. Improved infiltrates.   Electronically Signed   By: Rozetta Nunnery M.D.   On: 10/09/2013 17:33    Assessment: Her fever resolved promptly with the institution of antibiotic therapy and only one of 2 blood cultures have grown viridans strep. I recommend a total of 2 weeks of IV ceftriaxone therapy.  Plan: 1. PICC placement 2. Continue ceftriaxone 9 more days through August 14 3. I will sign off now but please call if I can be of further assistance  Michel Bickers, MD [facility] for [place] pager   [phone] cell 10/10/2013, 5:42 PM

## 2013-10-10 NOTE — Progress Notes (Signed)
Physical Therapy Treatment Patient Details Name: [name] [name] MRN: [id number] DOB: 08/02/1940 Today's Date: 10/10/2013    History of Present Illness Adm 10/03/13 with CHF exacerbation, cardiogenic shock, and developed gout in bil feet. PMHx- CHF with EF 40%, CKD, CVA, PAF, MVR 04/2013    PT Comments    Pt able to ambulate today in room with RW and +2 to bring chair behind pt due to fatigue.  Daughter reports they are planning on sending pt to rehab.  Follow Up Recommendations  SNF;Supervision/Assistance - 24 hour     Equipment Recommendations  None recommended by PT    Recommendations for Other Services       Precautions / Restrictions Precautions Precautions: Fall    Mobility  Bed Mobility   Bed Mobility: Supine to Sit     Supine to sit: Min assist;HOB elevated     General bed mobility comments: Use of rail andA to get trunk upright. Was able to initiate with B LE.  Transfers Overall transfer level: Needs assistance Equipment used: Rolling walker (2 wheeled) Transfers: Sit to/from Stand Sit to Stand: From elevated surface;Min assist         General transfer comment: Pt with posterior lean upon initial standing  Ambulation/Gait Ambulation/Gait assistance: +2 safety/equipment;Min assist Ambulation Distance (Feet): 20 Feet Assistive device: Rolling walker (2 wheeled) Gait Pattern/deviations: Decreased stride length Gait velocity: decreased   General Gait Details: Pt needing A to manuever RW with turns and some A to continue its progression forward.  Chair brought behind pt due to overall weakness.   Stairs            Wheelchair Mobility    Modified Rankin (Stroke Patients Only)       Balance   Sitting-balance support: Feet supported;Bilateral upper extremity supported Sitting balance-[name] Scale: Fair     Standing balance support: Bilateral upper extremity supported Standing balance-[name] Scale: Poor Standing balance comment: with RW  and posterior lean.  Worked on shifting COG over BOS.                    Cognition Arousal/Alertness: Awake/alert Behavior During Therapy: WFL for tasks assessed/performed Overall Cognitive Status: Impaired/Different from baseline         Following Commands: Follows one step commands with increased time     Problem Solving: Slow processing;Decreased initiation;Difficulty sequencing;Requires verbal cues;Requires tactile cues General Comments: Cueing to stay on task. Asking what she should do next.    Exercises General Exercises - Lower Extremity Ankle Circles/Pumps: AROM;15 reps;Supine Heel Slides: AROM;Both;10 reps;Supine Hip ABduction/ADduction: AROM;Both;10 reps;Supine    General Comments        Pertinent Vitals/Pain No reports of pain    Home Living                      Prior Function            PT Goals (current goals can now be found in the care plan section) Acute Rehab PT Goals Patient Stated Goal: daughter reports to go to rehab PT Goal Formulation: With patient Time For Goal Achievement: 10/17/13 Potential to Achieve Goals: Good Progress towards PT goals: Progressing toward goals    Frequency  Min 3X/week    PT Plan Current plan remains appropriate    Co-evaluation             End of Session Equipment Utilized During Treatment: Gait belt Activity Tolerance: Patient limited by fatigue Patient left: in chair;with chair  alarm set;with family/visitor present;with call bell/phone within reach     Time: 1100-1128 PT Time Calculation (min): 28 min  Charges:  $Gait Training: 8-22 mins $Therapeutic Exercise: 8-22 mins                    G Codes:      [name] [name] 10/10/2013, 1:06 PM

## 2013-10-10 NOTE — Progress Notes (Signed)
Clinical Social Work Department BRIEF PSYCHOSOCIAL ASSESSMENT 10/10/2013  Patient:  [name] [name], [name] [name]     Account Number:  [id number]     Admit date:  10/03/2013  Clinical Social Worker:  Adair Laundry  Date/Time:  10/10/2013 10:00 AM  Referred by:  Physician  Date Referred:  10/10/2013 Referred for  SNF Placement   Other Referral:   Interview type:  Patient Other interview type:   Spoke with pt and pt daughter [name] [name] at bedside    PSYCHOSOCIAL DATA Living Status:  ALONE Admitted from facility:   Level of care:   Primary support name:  [name] [phone] Primary support relationship to patient:  CHILD, ADULT Degree of support available:   Pt has strong family support    CURRENT CONCERNS Current Concerns  Post-Acute Placement   Other Concerns:    SOCIAL WORK ASSESSMENT / PLAN CSW visited pt room and spoke with pt and pt daughter [name] [name] at bedside. Pt was somewhat confused during assessment so pt daughter had to continue to redirect pt. CSW completed majority of assessment with pt daughter. Pt daughter informed CSW that pt was at [facility] previously this year and they would like for pt to return. Pt daughter did inform CSW that MD had already notified them that should pt be placed back on Milrinone drop pt will have to dc to facility in [place]. Pt daughter was open to CSW sending referral to three facilities in [place] SNFs. Pt daughter informed CSW that cost of ambulance would be an issue and they have previously transported pt themselves. However, pt daughter unsure if that will be possible this time. CSW explained that coverage is never guaranteed but CSW role includes providing non-emergent ambulance company with paperwork informing why pt needed this form of transportation. Pt daughter had no other questions or concerns at this time.   Assessment/plan status:  Psychosocial Support/Ongoing Assessment of Needs Other assessment/  plan:   Information/referral to community resources:   SNF list to be provided with bed offers    PATIENT'S/FAMILY'S RESPONSE TO PLAN OF CARE: Pt and pt daughter cooperative and pleasant. Both are agreeable to SNF.       [place], [place]

## 2013-10-10 NOTE — Progress Notes (Signed)
[name], female   DOB: 08/15/1940, 73 y.o.   MRN: [id number] Advanced Heart Failure Rounding Note   Subjective:   [name] [name] is a 73 yo obese white female with known history of rheumatic fever during childhood and long standing history of chronic diastolic congestive heart failure. She also has a history of CKD, HTN , CVA 2003,, and PAF. 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 Was discharged 05/31/13 to [facility] center and weight was 207 lbs. Pre-operative LHC in 2/15 showed no significant CAD.   Prior to admit amiodarone and coreg stopped due to bradycardia. Admitted 7/28 with suspected low output. Swan placed.  Diuretics held initially.  Patient was noted to be volume overloaded with elevated creatinine.  She was started on dopamine and IV Lasix.  Weight is down, IV Lasix and dopamine stopped yesterday.  Denies SOB, orthopnea or CP. Not very mobile and PT recommends SNF or 24 hr care. Creatinine rose today after stopping dopamine.  UOP not measured accurately yesterday.  She reports multiple bowel movements but all formed stools.   Creatinine 3.29>2.93>2.56>2.0>1.77>0.36 (suspect inaccurate)> 1.5>1.83>2.44 Objective:   Weight Range:  Vital Signs:   Temp:  [98 F (36.7 C)-98.2 F (36.8 C)] 98.2 F (36.8 C) (08/05 0551) Pulse Rate:  [59-63] 63 (08/05 0551) Resp:  [12-16] 16 (08/05 0551) BP: (88-126)/(40-56) 126/42 mmHg (08/05 0551) SpO2:  [96 %-99 %] 98 % (08/05 0551) Weight:  [173 lb 6.4 oz (78.654 kg)] 173 lb 6.4 oz (78.654 kg) (08/04 2005) Last BM Date: 10/10/13  Weight change: Filed Weights   10/08/13 0500 10/09/13 0408 10/09/13 2005  Weight: 178 lb 9.2 oz (81 kg) 173 lb 4.5 oz (78.6 kg) 173 lb 6.4 oz (78.654 kg)    Intake/Output:   Intake/Output Summary (Last 24 hours) at 10/10/13 0821 Last data filed at  10/10/13 0500  Gross per 24 hour  Intake      0 ml  Output    850 ml  Net   -850 ml     Physical Exam: CVP 7 General:  Chronically ill appearing. No resp difficulty, lying in bed HEENT: normal Neck: supple. JVP flat . Carotids 2+ bilat; no bruits. No lymphadenopathy or thryomegaly appreciated. R neck swan  Cor: PMI nondisplaced. Regular rate & rhythm. No rubs, gallops or murmurs. Trace edema. Lungs: clear Abdomen: soft, nontender, nondistended. No hepatosplenomegaly. No bruits or masses. Good bowel sounds. Extremities: no cyanosis, clubbing, rash.  No redness around toe joints despite pain.  Neuro: alert & orientedx3, cranial nerves grossly intact. moves all 4 extremities w/o difficulty. Affect pleasant GU: Foley   Telemetry: SR 70s  Labs: Basic Metabolic Panel:  Recent Labs Lab 10/03/13 1416  10/07/13 0402 10/07/13 1330 10/08/13 0300 10/09/13 0930 10/10/13 0445  NA 141  < > 135* 134* 138 135* 134*  K 5.6*  < > 3.9 4.1 4.8 3.8 4.0  CL 101  < > 93* 90* 91* 84* 85*  CO2 28  < > 31 34* 36* 40* 39*  GLUCOSE 99  < > 161* 148* 120* 144* 91  BUN 43*  < > 35* 36* 39* 46* 54*  CREATININE 2.93*  < > 0.36* 0.58 1.53* 1.83* 2.44*  CALCIUM 9.3  < > 8.1* 9.0 9.3 9.6 9.3  MG 2.6*  --   --   --   --   --   --   < > =  values in this interval not displayed.  Liver Function Tests:  Recent Labs Lab 10/03/13 1416  AST 21  ALT 13  ALKPHOS 96  BILITOT 0.4  PROT 7.0  ALBUMIN 3.0*   No results found for this basename: LIPASE, AMYLASE,  in the last 168 hours No results found for this basename: AMMONIA,  in the last 168 hours  CBC:  Recent Labs Lab 10/05/13 2100 10/06/13 0406 10/07/13 0402 10/08/13 0300 10/09/13 0400  WBC 8.5 8.4 8.4 7.8 6.5  NEUTROABS 6.3  --   --   --   --   HGB 9.6* 8.9* 8.4* 8.9* 9.3*  HCT 30.6* 28.6* 27.4* 29.4* 30.1*  MCV 89.5 88.0 89.5 88.6 88.3  PLT 175 186 156 199 219    Cardiac Enzymes:  Recent Labs Lab 10/03/13 1416 10/03/13 1916  10/04/13 0550  TROPONINI <0.30 <0.30 <0.30    BNP: BNP (last 3 results)  Recent Labs  07/18/13 1107 08/28/13 1418 10/02/13 1134  PROBNP 1514.0* 1518.0* 2909.0*    Imaging: Dg Chest 2 View  10/08/2013   CLINICAL DATA:  Pneumonia  EXAM: CHEST  2 VIEW  COMPARISON:  October 05, 2013 and June 04, 2013  FINDINGS: There is underlying emphysematous change. There is scarring in both lower lobes, more on the right than on the left. Stable. There is generalized interstitial prominence which probably reflects chronic inflammatory type change. Note that there may be some mild chronic congestive heart failure superimposed. Heart is enlarged with pulmonary vascularity within normal limits. There is a mitral valve replacement present. Central catheter tip is in the superior vena cava. No pneumothorax. There are surgical clips in the left neck region. There is degenerative change in the thoracic spine.  IMPRESSION: Underlying emphysema. Probable chronic inflammatory change in the lungs, although a degree of chronic congestive heart failure cannot be excluded. There is no airspace consolidation. There is stable cardiomegaly with a mitral valve prosthesis present.   Electronically Signed   By: Lowella Grip M.D.   On: 10/08/2013 13:53   Dg Chest Port 1 View  10/09/2013   CLINICAL DATA:  PICC placement.  EXAM: PORTABLE CHEST - 1 VIEW  COMPARISON:  10/08/2013  FINDINGS: PICC tip is 9.2 cm below the carina in the right atrium and could be retracted 4-5 cm. Central venous sheath in the superior vena cava at the level of the carina.  There is persistent cardiomegaly with a improved hazy infiltrate in the right lung. There is chronic accentuation of the markings on the left. Prosthetic valve noted.  IMPRESSION: PICC tip is in the right atrium and could be retracted 4-5 cm. Improved infiltrates.   Electronically Signed   By: Rozetta Nunnery M.D.   On: 10/09/2013 17:33     Medications:     Scheduled Medications: .  amiodarone  100 mg Oral Daily  . antiseptic oral rinse  7 mL Mouth Rinse BID  . cefTRIAXone (ROCEPHIN)  IV  1 g Intravenous Q24H  . colchicine  0.6 mg Oral BID  . oxybutynin  5 mg Oral Q2200  . simvastatin  10 mg Oral q1800  . sodium chloride  10-40 mL Intracatheter Q12H  . sodium chloride  3 mL Intravenous Q12H  . traMADol  50 mg Oral Q12H  . Warfarin - Pharmacist Dosing Inpatient   Does not apply q1800    Infusions:    PRN Medications: sodium chloride, acetaminophen, ondansetron (ZOFRAN) IV, oxyCODONE-acetaminophen, sodium chloride, sodium chloride   Assessment:  1) AKI  2) Cardiogenic shock: Echo (3/15) with EF 40-45%, mildly dilated RV with mildly decreased systolic function, bioprosthetic mitral valve.  Echo (7/15) with EF 50-55%, moderate AI, bioprosthetic MV ok.  - suspect low output  3) severe MR: Minimally invasive Maze procedure with MV replacement (05/16/13)  4) PAF  5) Sinus Brady- no bb 6) Suspected gout: foot pain, elevated uric acid.  7) Strep viridans bacteremia: no vegetation seen on TTE this admission.  8) DNR  Plan/Discussion:    Patient is off IV Lasix and dopamine now, and creatinine has risen to 2.44.  I will hold torsemide today and will send a co-ox.  I am concerned that this will be a low output issue with cardiorenal syndrome and that she is going to require inotropy at home.  If co-ox is low, will likely start her on milrinone gtt for home. She is DNR but does not want hospice yet.  This will be an ongoing conversation given her overall poor prognosis.   Blood cultures + with strep viridans. ID on board and appreciate help. She remains on rocephin currently.  No endocarditis seen on TTE.  CXR did not show PNA.  Will hold off on TEE.  She will need 10 more days IV ceftriaxone.   She will need rehab after discharge. Screening can begin.  Should be able to leave in next day or 2 once issue of home inotrope is worked out.   [name] 10/10/2013 8:21  AM

## 2013-10-10 NOTE — Progress Notes (Signed)
ANTICOAGULATION CONSULT NOTE - Follow up [place] for Coumadin Indication: aflutter, bioprosthetic mitral valve  Allergies  Allergen Reactions  . Indomethacin Other (See Comments)    dizziness  . Norvasc [Amlodipine Besylate] Cough    Patient Measurements: Height: 5\' 2"  (157.5 cm) Weight: 173 lb 6.4 oz (78.654 kg) IBW/kg (Calculated) : 50.1  Vital Signs: Temp: 98.2 F (36.8 C) (08/05 0551) Temp src: Oral (08/05 0551) BP: 126/42 mmHg (08/05 0551) Pulse Rate: 63 (08/05 0551)  Labs:  Recent Labs  10/08/13 0300 10/09/13 0400 10/09/13 0930 10/10/13 0445  HGB 8.9* 9.3*  --   --   HCT 29.4* 30.1*  --   --   PLT 199 219  --   --   LABPROT  --  21.5*  --  22.3*  INR  --  1.87*  --  1.96*  CREATININE 1.53*  --  1.83* 2.44*    Estimated Creatinine Clearance: 20.2 ml/min (by C-G formula based on Cr of 2.44).  Assessment: 73yo F on coumadin pta for aflutter, bioprosthetic mitral valve s/p DCCV 06/06/13, admitted for low output heart failure.  INR was trending down on home dose, now increased but still slightly SUBtherapeutic at 1.96 with increased dose yesterday. Of note, amiodarone was restarted at lower dose than home dose. H/H low but stable, plt wnl and stable with no bleeding reported.  Home dose is 2mg  Sun/Wed and 1mg  all other days  Goal of Therapy:  INR 2-3 Monitor platelets by anticoagulation protocol: Yes   Plan:  - Coumadin 2 mg PO x 1 tonight - Daily INR  - Continue to monitor CBC and s/s bleeding  Harolyn Rutherford, PharmD Clinical Pharmacist - Resident Pager: [phone] Pharmacy: [phone] 10/10/2013 9:15 AM

## 2013-10-10 NOTE — Care Management Note (Signed)
    Page 1 of 2   10/10/2013     12:47:46 PM CARE MANAGEMENT NOTE 10/10/2013  Patient:  [name] [name], [name] [name]   Account Number:  [id number]  Date Initiated:  10/03/2013  Documentation initiated by:  [facility]  Subjective/Objective Assessment:   Admitted with A/C CHF.     Action/Plan:   Anticipated DC Date:  10/08/2013   Anticipated DC Plan:  [place] referral  Clinical Social Worker      DC Planning Services  CM consult      Choice offered to / List presented to:             Status of service:  Completed, signed off Medicare Important Message given?  YES (If response is "NO", the following Medicare IM given date fields will be blank) Date Medicare IM given:  10/08/2013 Medicare IM given by:  [facility] Date Additional Medicare IM given:  10/10/2013 Additional Medicare IM given by:  Adonna Horsley GRAVES-BIGELOW  Discharge Disposition:  [place]  Per UR Regulation:  Reviewed for med. necessity/level of care/duration of stay  If discussed at [place] of Stay Meetings, dates discussed:   10/09/2013  10/11/2013    Comments:  Contact:  Hodges,[name] Daughter [phone]   [phone]                 Domenick Gong [phone]   10-10-13 1245 [place], [place] [phone] Pt is from home with grandson. Per pt and family the plan will be for SNF once medically stable for d/c. CSW assisting with disposition needs.  10-09-13 9:45am [place] [phone] Remains on dopamine and lasix drips.  Palliative consulted.   10/08/2013 Additional Medicare IM copy given to patient. Jonnie Finner RN CCM Case Mgmt phone [phone]

## 2013-10-10 NOTE — Progress Notes (Addendum)
Clinical Social Work Department CLINICAL SOCIAL WORK PLACEMENT NOTE 10/10/2013  Patient:  [name] [name], [name] [name]  Account Number:  [id number] Admit date:  10/03/2013  Clinical Social Worker:  Adair Laundry  Date/time:  10/10/2013 10:45 AM  Clinical Social Work is seeking post-discharge placement for this patient at the following level of care:   SKILLED NURSING   (*CSW will update this form in Epic as items are completed)   10/10/2013  Patient/family provided with [place] Department of Clinical Social Work's list of facilities offering this level of care within the geographic area requested by the patient (or if unable, by the patient's family).  10/10/2013  Patient/family informed of their freedom to choose among providers that offer the needed level of care, that participate in Medicare, Medicaid or managed care program needed by the patient, have an available bed and are willing to accept the patient.  10/10/2013  Patient/family informed of [facility]' ownership interest in [facility], as well as of the fact that they are under no obligation to receive care at this facility.  PASARR submitted to EDS on Existing PASARR number received on   FL2 transmitted to all facilities in geographic area requested by pt/family on  10/10/2013 FL2 transmitted to all facilities within larger geographic area on   Patient informed that his/her managed care company has contracts with or will negotiate with  certain facilities, including the following:     Patient/family informed of bed offers received:  10/11/2013 Patient chooses bed at [facility] Physician recommends and patient chooses bed at    Patient to be transferred [facility]  on   10/11/2013 Patient to be transferred to facility by [facility] Patient and family notified of transfer on  10/11/2013 Name of family member notified:  [name] (daughter)  The following physician request were entered in  Epic: Physician Request  Please sign FL2.    Additional Comments:

## 2013-10-10 NOTE — Progress Notes (Signed)
Ms. Macioce is telling me that she is planning to "go home and drive." She is not very realistic and is very evasive during conversation. Many visitors at bedside. I spoke with daughter [name] who seems to be more realistic and discussed the use of hospice at home. [name] tells me that she will find a way to care for her mother at home as they have always done for grandparents and other family members. They remain hopeful for rehab and are still hoping to "find a medicine that will work for the fluid." We discussed this difficulty with progressive disease and worsening renal function. [name] is open to hospice and I provided her with information and contact for [facility] and encourage her to call them when they are prepared for hospice. I believe [name] is more realistic and understands more what they should be expecting. I will continue to follow and support.  Vinie Sill, NP Palliative Medicine Team Pager # [phone] (M-F 8a-5p) Team Phone # [phone] (Nights/Weekends)

## 2013-10-11 ENCOUNTER — Inpatient Hospital Stay
Admission: RE | Admit: 2013-10-11 | Discharge: 2013-10-30 | Disposition: A | Discharge status: Home / Self Care | Payer: Medicare HMO | Source: Ambulatory Visit | Attending: Internal Medicine | Admitting: Internal Medicine

## 2013-10-11 ENCOUNTER — Ambulatory Visit: Payer: Commercial Managed Care - HMO | Admitting: Cardiology

## 2013-10-11 LAB — BASIC METABOLIC PANEL
ANION GAP: 10 (ref 5–15)
BUN: 53 mg/dL — ABNORMAL HIGH (ref 6–23)
CHLORIDE: 89 meq/L — AB (ref 96–112)
CO2: 37 mEq/L — ABNORMAL HIGH (ref 19–32)
Calcium: 9.4 mg/dL (ref 8.4–10.5)
Creatinine, Ser: 2.3 mg/dL — ABNORMAL HIGH (ref 0.50–1.10)
GFR calc Af Amer: 23 mL/min — ABNORMAL LOW (ref >90)
GFR calc non Af Amer: 20 mL/min — ABNORMAL LOW (ref >90)
Glucose, Bld: 104 mg/dL — ABNORMAL HIGH (ref 70–99)
POTASSIUM: 3.5 meq/L — AB (ref 3.7–5.3)
Sodium: 136 mEq/L — ABNORMAL LOW (ref 137–147)

## 2013-10-11 LAB — CBC
HCT: 29 % — ABNORMAL LOW (ref 36.0–46.0)
HEMOGLOBIN: 8.7 g/dL — AB (ref 12.0–15.0)
MCH: 26.6 pg (ref 26.0–34.0)
MCHC: 30 g/dL (ref 30.0–36.0)
MCV: 88.7 fL (ref 78.0–100.0)
PLATELETS: 218 10*3/uL (ref 150–400)
RBC: 3.27 MIL/uL — ABNORMAL LOW (ref 3.87–5.11)
RDW: 16.6 % — ABNORMAL HIGH (ref 11.5–15.5)
WBC: 6.4 10*3/uL (ref 4.0–10.5)

## 2013-10-11 LAB — CARBOXYHEMOGLOBIN
Carboxyhemoglobin: 1.6 % — ABNORMAL HIGH (ref 0.5–1.5)
Methemoglobin: 0.8 % (ref 0.0–1.5)
O2 Saturation: 66.9 %
Total hemoglobin: 9.3 g/dL — ABNORMAL LOW (ref 12.0–16.0)

## 2013-10-11 LAB — PROTIME-INR
INR: 2.06 — ABNORMAL HIGH (ref 0.00–1.49)
PROTHROMBIN TIME: 23.2 s — AB (ref 11.6–15.2)

## 2013-10-11 MED ORDER — HEPARIN SOD (PORK) LOCK FLUSH 100 UNIT/ML IV SOLN
250.0000 [IU] | INTRAVENOUS | Status: AC | PRN
  Administered 2013-10-11: 11:00:00

## 2013-10-11 MED ORDER — COLCHICINE 0.6 MG PO TABS
0.3000 mg | ORAL_TABLET | Freq: Every day | ORAL | Status: DC
  Administered 2013-10-11: 0.3 mg via ORAL
  Filled 2013-10-11: qty 0.5

## 2013-10-11 MED ORDER — OXYCODONE-ACETAMINOPHEN 5-325 MG PO TABS: 1.0000 | ORAL_TABLET | ORAL | Status: DC | PRN

## 2013-10-11 MED ORDER — AMIODARONE HCL 100 MG PO TABS: 100.0000 mg | ORAL_TABLET | Freq: Every day | ORAL | Status: DC

## 2013-10-11 MED ORDER — COLCHICINE 0.6 MG PO TABS
0.3000 mg | ORAL_TABLET | Freq: Every day | ORAL | Status: AC
Start: 2013-10-11 — End: ?

## 2013-10-11 MED ORDER — DEXTROSE 5 % IV SOLN: 2.0000 g | INTRAVENOUS | Status: AC

## 2013-10-11 MED ORDER — WARFARIN SODIUM 1 MG PO TABS
1.0000 mg | ORAL_TABLET | ORAL | Status: DC
  Filled 2013-10-11: qty 1

## 2013-10-11 MED ORDER — TORSEMIDE 20 MG PO TABS: 60.0000 mg | ORAL_TABLET | Freq: Every day | ORAL | Status: DC

## 2013-10-11 MED ORDER — WARFARIN SODIUM 2 MG PO TABS: 2.0000 mg | ORAL_TABLET | ORAL | Status: DC

## 2013-10-11 MED ORDER — WARFARIN SODIUM 1 MG PO TABS: ORAL_TABLET | ORAL | Status: DC

## 2013-10-11 NOTE — Progress Notes (Signed)
[name], female   DOB: 1940/06/27, 73 y.o.   MRN: [id number] Advanced Heart Failure Rounding Note   Subjective:   [name] [name] is a 73 yo obese white female with known history of rheumatic fever during childhood and long standing history of chronic diastolic congestive heart failure. She also has a history of CKD, HTN , CVA 2003,, and PAF. 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 Was discharged 05/31/13 to [facility] center and weight was 207 lbs. Pre-operative LHC in 2/15 showed no significant CAD.   Prior to admit amiodarone and coreg stopped due to bradycardia. Admitted 7/28 with suspected low output. Swan placed.  Diuretics held initially.  Patient was noted to be volume overloaded with elevated creatinine.  She was started on dopamine and IV Lasix.  Weight is down again, IV Lasix and dopamine stopped Tuesday.  Denies SOB, orthopnea or CP. Not very mobile and PT recommends SNF or 24 hr care. Creatinine rose after stopping dopamine, mildly lower today.  Co-ox 66% off dopamine.   Creatinine 3.29>2.93>2.56>2.0>1.77>0.36 (suspect inaccurate)> 1.5>1.83>2.44>2.3 Objective:   Weight Range:  Vital Signs:   Temp:  [97.3 F (36.3 C)-97.8 F (36.6 C)] 97.8 F (36.6 C) (08/06 0621) Pulse Rate:  [60-63] 63 (08/06 0621) Resp:  [17-18] 18 (08/06 0621) BP: (106-117)/(42-47) 117/46 mmHg (08/06 0621) SpO2:  [97 %-98 %] 98 % (08/06 0621) Weight:  [76.068 kg (167 lb 11.2 oz)] 76.068 kg (167 lb 11.2 oz) (08/06 0621) Last BM Date: 10/10/13  Weight change: Filed Weights   10/09/13 0408 10/09/13 2005 10/11/13 0621  Weight: 78.6 kg (173 lb 4.5 oz) 78.654 kg (173 lb 6.4 oz) 76.068 kg (167 lb 11.2 oz)    Intake/Output:   Intake/Output Summary (Last 24 hours) at 10/11/13 0902 Last data filed at 10/11/13 3976  Gross per 24 hour  Intake      0 ml   Output    400 ml  Net   -400 ml     Physical Exam: CVP 7 General:  Chronically ill appearing. No resp difficulty, lying in bed HEENT: normal Neck: supple. JVP flat . Carotids 2+ bilat; no bruits. No lymphadenopathy or thryomegaly appreciated. R neck swan  Cor: PMI nondisplaced. Regular rate & rhythm. No rubs, gallops or murmurs. Trace edema. Lungs: clear Abdomen: soft, nontender, nondistended. No hepatosplenomegaly. No bruits or masses. Good bowel sounds. Extremities: no cyanosis, clubbing, rash.  No redness around toe joints despite pain.  Neuro: alert & orientedx3, cranial nerves grossly intact. moves all 4 extremities w/o difficulty. Affect pleasant GU: Foley   Telemetry: SR 70s  Labs: Basic Metabolic Panel:  Recent Labs Lab 10/07/13 1330 10/08/13 0300 10/09/13 0930 10/10/13 0445 10/11/13 0500  NA 134* 138 135* 134* 136*  K 4.1 4.8 3.8 4.0 3.5*  CL 90* 91* 84* 85* 89*  CO2 34* 36* 40* 39* 37*  GLUCOSE 148* 120* 144* 91 104*  BUN 36* 39* 46* 54* 53*  CREATININE 0.58 1.53* 1.83* 2.44* 2.30*  CALCIUM 9.0 9.3 9.6 9.3 9.4    Liver Function Tests: No results found for this basename: AST, ALT, ALKPHOS, BILITOT, PROT, ALBUMIN,  in the last 168 hours No results found for this basename: LIPASE, AMYLASE,  in the last 168 hours No results found for this basename: AMMONIA,  in the last 168 hours  CBC:  Recent Labs Lab 10/05/13 2100 10/06/13 0406 10/07/13 0402 10/08/13 0300 10/09/13 0400  10/11/13 0500  WBC 8.5 8.4 8.4 7.8 6.5 6.4  NEUTROABS 6.3  --   --   --   --   --   HGB 9.6* 8.9* 8.4* 8.9* 9.3* 8.7*  HCT 30.6* 28.6* 27.4* 29.4* 30.1* 29.0*  MCV 89.5 88.0 89.5 88.6 88.3 88.7  PLT 175 186 156 199 219 218    Cardiac Enzymes: No results found for this basename: CKTOTAL, CKMB, CKMBINDEX, TROPONINI,  in the last 168 hours  BNP: BNP (last 3 results)  Recent Labs  07/18/13 1107 08/28/13 1418 10/02/13 1134  PROBNP 1514.0* 1518.0* 2909.0*    Imaging: Dg  Chest Port 1 View  10/09/2013   CLINICAL DATA:  PICC placement.  EXAM: PORTABLE CHEST - 1 VIEW  COMPARISON:  10/08/2013  FINDINGS: PICC tip is 9.2 cm below the carina in the right atrium and could be retracted 4-5 cm. Central venous sheath in the superior vena cava at the level of the carina.  There is persistent cardiomegaly with a improved hazy infiltrate in the right lung. There is chronic accentuation of the markings on the left. Prosthetic valve noted.  IMPRESSION: PICC tip is in the right atrium and could be retracted 4-5 cm. Improved infiltrates.   Electronically Signed   By: Rozetta Nunnery M.D.   On: 10/09/2013 17:33     Medications:     Scheduled Medications: . amiodarone  100 mg Oral Daily  . antiseptic oral rinse  7 mL Mouth Rinse BID  . cefTRIAXone (ROCEPHIN)  IV  2 g Intravenous Q24H  . colchicine  0.3 mg Oral Daily  . oxybutynin  5 mg Oral Q2200  . simvastatin  10 mg Oral q1800  . sodium chloride  10-40 mL Intracatheter Q12H  . sodium chloride  3 mL Intravenous Q12H  . traMADol  50 mg Oral Q12H  . Warfarin - Pharmacist Dosing Inpatient   Does not apply q1800    Infusions:    PRN Medications: sodium chloride, acetaminophen, ondansetron (ZOFRAN) IV, oxyCODONE-acetaminophen, sodium chloride, sodium chloride   Assessment:  1) AKI  2) Cardiogenic shock: Echo (3/15) with EF 40-45%, mildly dilated RV with mildly decreased systolic function, bioprosthetic mitral valve.  Echo (7/15) with EF 50-55%, moderate AI, bioprosthetic MV ok.  - suspect low output  3) severe MR: Minimally invasive Maze procedure with MV replacement (05/16/13)  4) PAF  5) Sinus Brady: tolerating low dose amiodarone.  6) Suspected gout: foot pain, elevated uric acid.  7) Strep viridans bacteremia: no vegetation seen on TTE this admission.  8) DNR  Plan/Discussion:    Patient is off IV Lasix and dopamine now.  Creatinine rose to 2.44, now down to 2.3.  Co-ox excellent, will not qualify for home milrinone.   Balancing volume and renal function will continue to be difficult.  I am going to have her start torsemide 60 mg daily tomorrow.   She is DNR but does not want hospice yet.  This will be an ongoing conversation given her overall poor prognosis.   Blood cultures + with strep viridans. ID on board and appreciate help. She remains on rocephin currently.  No endocarditis seen on TTE.  CXR did not show PNA.  Will hold off on TEE.  She will need ceftriaxone until 8/14.   I think that she is stable to go to [facility] today.  Will need followup CHF clinic with [facility] in 1 week.  Meds: Ceftriaxone via PICC to 8/14, warfarin (will need INR followup), amiodarone 100  mg daily, torsemide 60 mg daily (start tomorrow), KCl 20 daily, simvastatin 10 mg daily.   [name] 10/11/2013 9:02 AM

## 2013-10-11 NOTE — Progress Notes (Signed)
ANTICOAGULATION CONSULT NOTE - Follow up [place] for Coumadin Indication: aflutter, bioprosthetic mitral valve  Allergies  Allergen Reactions  . Indomethacin Other (See Comments)    dizziness  . Norvasc [Amlodipine Besylate] Cough    Patient Measurements: Height: 5\' 2"  (157.5 cm) Weight: 167 lb 11.2 oz (76.068 kg) IBW/kg (Calculated) : 50.1  Vital Signs: Temp: 97.8 F (36.6 C) (08/06 0621) Temp src: Oral (08/06 0621) BP: 117/46 mmHg (08/06 0621) Pulse Rate: 63 (08/06 0621)  Labs:  Recent Labs  10/09/13 0400 10/09/13 0930 10/10/13 0445 10/11/13 0500  HGB 9.3*  --   --  8.7*  HCT 30.1*  --   --  29.0*  PLT 219  --   --  218  LABPROT 21.5*  --  22.3* 23.2*  INR 1.87*  --  1.96* 2.06*  CREATININE  --  1.83* 2.44* 2.30*    Estimated Creatinine Clearance: 21.1 ml/min (by C-G formula based on Cr of 2.3).  Assessment: 73yo F on coumadin pta for aflutter, bioprosthetic mitral valve s/p DCCV 06/06/13, admitted for low output heart failure.  INR was trending down on home dose, now increased to therapeutic level of 2.06 with increased dose on 8/5. Of note, amiodarone was restarted at lower dose than home dose. H/H low but stable, plt wnl and stable with no bleeding reported.  Home dose is 2mg  Sun/Wed and 1mg  all other days  Goal of Therapy:  INR 2-3 Monitor platelets by anticoagulation protocol: Yes   Plan:  - Patient to be discharged today - Recommend increase in home dose to 2 mg on MWF and 1 mg TTSS - INR should be checked within a week of discharge   Harolyn Rutherford, PharmD Clinical Pharmacist - Resident Pager: [phone] Pharmacy: [phone] 10/11/2013 10:10 AM

## 2013-10-11 NOTE — Discharge Instructions (Signed)
Information on my medicine - Coumadin   (Warfarin)  This medication education was reviewed with me or my healthcare representative as part of my discharge preparation.  The pharmacist that spoke with me during my hospital stay was:  Von Nils Flack, RPH  Why was Coumadin prescribed for you? Coumadin was prescribed for you because you have a blood clot or a medical condition that can cause an increased risk of forming blood clots. Blood clots can cause serious health problems by blocking the flow of blood to the heart, lung, or brain. Coumadin can prevent harmful blood clots from forming. As a reminder your indication for Coumadin is:   Stroke Prevention Because Of Atrial Fibrillation  What test will check on my response to Coumadin? While on Coumadin (warfarin) you will need to have an INR test regularly to ensure that your dose is keeping you in the desired range. The INR (international normalized ratio) number is calculated from the result of the laboratory test called prothrombin time (PT).  If an INR APPOINTMENT HAS NOT ALREADY BEEN MADE FOR YOU please schedule an appointment to have this lab work done by your health care provider within 7 days. Your INR goal is usually a number between:  2 to 3 or your provider may give you a more narrow range like 2-2.5.  Ask your health care provider during an office visit what your goal INR is.  What  do you need to  know  About  COUMADIN? Take Coumadin (warfarin) exactly as prescribed by your healthcare provider about the same time each day.  DO NOT stop taking without talking to the doctor who prescribed the medication.  Stopping without other blood clot prevention medication to take the place of Coumadin may increase your risk of developing a new clot or stroke.  Get refills before you run out.  What do you do if you miss a dose? If you miss a dose, take it as soon as you remember on the same day then continue your regularly scheduled regimen the  next day.  Do not take two doses of Coumadin at the same time.  Important Safety Information A possible side effect of Coumadin (Warfarin) is an increased risk of bleeding. You should call your healthcare provider right away if you experience any of the following:   Bleeding from an injury or your nose that does not stop.   Unusual colored urine (red or dark brown) or unusual colored stools (red or black).   Unusual bruising for unknown reasons.   A serious fall or if you hit your head (even if there is no bleeding).  Some foods or medicines interact with Coumadin (warfarin) and might alter your response to warfarin. To help avoid this:   Eat a balanced diet, maintaining a consistent amount of Vitamin K.   Notify your provider about major diet changes you plan to make.   Avoid alcohol or limit your intake to 1 drink for women and 2 drinks for men per day. (1 drink is 5 oz. wine, 12 oz. beer, or 1.5 oz. liquor.)  Make sure that ANY health care provider who prescribes medication for you knows that you are taking Coumadin (warfarin).  Also make sure the healthcare provider who is monitoring your Coumadin knows when you have started a new medication including herbals and non-prescription products.  Coumadin (Warfarin)  Major Drug Interactions  Increased Warfarin Effect Decreased Warfarin Effect  Alcohol (large quantities) Antibiotics (esp. Septra/Bactrim, Flagyl, Cipro) Amiodarone (Cordarone)  Aspirin (ASA) Cimetidine (Tagamet) Megestrol (Megace) NSAIDs (ibuprofen, naproxen, etc.) Piroxicam (Feldene) Propafenone (Rythmol SR) Propranolol (Inderal) Isoniazid (INH) Posaconazole (Noxafil) Barbiturates (Phenobarbital) Carbamazepine (Tegretol) Chlordiazepoxide (Librium) Cholestyramine (Questran) Griseofulvin Oral Contraceptives Rifampin Sucralfate (Carafate) Vitamin K   Coumadin (Warfarin) Major Herbal Interactions  Increased Warfarin Effect Decreased Warfarin Effect   Garlic Ginseng Ginkgo biloba Coenzyme Q10 Green tea St. Johns wort    Coumadin (Warfarin) FOOD Interactions  Eat a consistent number of servings per week of foods HIGH in Vitamin K (1 serving =  cup)  Collards (cooked, or boiled & drained) Kale (cooked, or boiled & drained) Mustard greens (cooked, or boiled & drained) Parsley *serving size only =  cup Spinach (cooked, or boiled & drained) Swiss chard (cooked, or boiled & drained) Turnip greens (cooked, or boiled & drained)  Eat a consistent number of servings per week of foods MEDIUM-HIGH in Vitamin K (1 serving = 1 cup)  Asparagus (cooked, or boiled & drained) Broccoli (cooked, boiled & drained, or raw & chopped) Brussel sprouts (cooked, or boiled & drained) *serving size only =  cup Lettuce, raw (green leaf, endive, romaine) Spinach, raw Turnip greens, raw & chopped   These websites have more information on Coumadin (warfarin):  FailFactory.se; VeganReport.com.au;

## 2013-10-11 NOTE — Progress Notes (Addendum)
CSW (Clinical Education officer, museum) spoke with [facility] and notified of acceptance of bed offer and dc today. CSW also called insurance representative to notify.   ADDENDUM 2:30pm: CSW (Clinical Education officer, museum) prepared pt dc packet and placed with shadow chart. CSW arranged non-emergent ambulance transport. Pt, pt family, pt nurse, and facility informed. CSW signing off.  [place], [place]

## 2013-10-11 NOTE — Discharge Summary (Signed)
Advanced Heart Failure Team  Discharge Summary   [name] MRN: [id number], DOB/AGE: Mar 17, 1940 73 y.o. Admit date: 10/03/2013 D/C date:     10/11/2013   Primary Physician: Dr. Quintin Alto  Primary Cardiologist: Dr. Harl Bowie  Cardiac Surgeon: Dr Roxy Manns.   -----DNR-----  Primary Discharge Diagnoses:  1) AKI  2) A/C combined systolic/diastolic HF - ECHO (4/09): EF 40-45%, mildly dilated RV with mildly decreased systolic function, bioprosthetic mitral valve. Echo (7/15) with EF 50-55%, moderate AI, bioprosthetic MV ok.  - ECHO (7/15): EF 50-55% and moderate AI  Secondary Discharge Diagnoses:  1) severe MR: Minimally invasive Maze procedure with MV replacement (05/16/13)  2) PAF  - on amiodarone and coumadin 3) Sinus Brady: tolerating low dose amiodarone.  4) Suspected gout: foot pain, elevated uric acid.  5) Strep viridans bacteremia: no vegetation seen on TTE this admission.  - Continue on Ceftriaxone 2 gm daily until 10/19/13 6) Hypokalemia 7) DNR  Hospital Course:  [name] [name] is a 73 yo obese white female with known history of rheumatic fever during childhood and long standing history of chronic combined systolic/diastolic congestive heart failure. She also has a history of CKD stage IV, HTN , CVA 2003,and PAF. 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 Was discharged 05/31/13 to [facility] center and weight was 207 lbs. Pre-operative LHC in 2/15 showed no significant CAD.   She has been followed closely in the HF clinic and presented to the clinic 7/27 for routine follow up. At that time she had increased fatigue and slight increase in SOB. Her EKG in clinic showed SB with 1 degree AV block and occassional PAC 45 bpm and her Amiodarone and coreg were stopped. Labs were drawn and were creatinine 3.29, K+ 5.5, BUN 41 and pro-BNP 2909 and the  patient was called about concerns for low output for planned admission.   She was admitted 7/28 and her diuretics were placed on hold. A central line was placed for co-ox's and CVPs. Her initial swan numbers were CVP 15, PAP 47/19, PCWP 20, CO/CI 4/2.2 and co-ox 57%. She had elevated left and right heart filling pressures and main issues was progressive renal dysfunction in the face of volume retention and dopamine was started at 2 mcg/kg/min and she was restarted on lasix IV. Her renal function started to improve, however remained volume overloaded so she was transitioned to a lasix gtt with metolazone. Her CVP started trending down and her diuretics were stopped along with her dopamine. Her creatinine started trending up so a co-ox was drawn to see if she would qualify for home milrinone, but it did not meet criteria. Her renal function ended up being stable around 2.4. She has needed admission prior to this for the same issue and concerns were that it was going to be very difficult to manage her fluid status as an outpatient so Palliative Care was consulted for Goals of Care. The patient and family met with Palliative care and the family was ok with Hospice, however the patient would not make any decisions other than she wanted to go to [facility] for rehab with wishes to optimize her strength and independence. The patient was not very realistic about goals and prognosis, but the daughter was and Palliative Care provided her with Hospice number to call if they ended up pursuing this option. PT worked with her and she remained very debilitated and recommended going  to SNF. Her amiodarone was started back at a lower dose which her HR tolerated.   During her stay she developed a high fever and had blood cultures drawn and was started on Rocephin empirically. The blood cultures came back positive for strep viridans and she was switched to Ceftriaxone. ID was consulted and they did not recommend TEE but  recommended TTE. The TTE showed no endocarditis and EF 50-55% and moderate AI. A PICC was placed for IV ceftriaxone for a total of 10 days of treatment, through August 14th.   On day of discharge she was stable and will be discharged to [facility] for rehab with her PICC in place. Her prognosis is poor and it is going to be difficult to manage her volume status in relation to her renal dysfunction. She will be followed closely in the HF clinic. Discharge weight 167 lbs   Discharge Weight Range: 167-172 lbs Discharge Vitals: Blood pressure 117/46, pulse 63, temperature 97.8 F (36.6 C), temperature source Oral, resp. rate 18, height '5\' 2"'  (1.575 m), weight 167 lb 11.2 oz (76.068 kg), SpO2 98.00%.  Labs: Lab Results  Component Value Date   WBC 6.4 10/11/2013   HGB 8.7* 10/11/2013   HCT 29.0* 10/11/2013   MCV 88.7 10/11/2013   PLT 218 10/11/2013     Recent Labs Lab 10/11/13 0500  NA 136*  K 3.5*  CL 89*  CO2 37*  BUN 53*  CREATININE 2.30*  CALCIUM 9.4  GLUCOSE 104*   Lab Results  Component Value Date   CHOL 89 05/23/2013   HDL 27* 05/23/2013   LDLCALC 35 05/23/2013   TRIG 134 05/23/2013   BNP (last 3 results)  Recent Labs  07/18/13 1107 08/28/13 1418 10/02/13 1134  PROBNP 1514.0* 1518.0* 2909.0*    Diagnostic Studies/Procedures   Dg Chest Port 1 View  10/09/2013   CLINICAL DATA:  PICC placement.  EXAM: PORTABLE CHEST - 1 VIEW  COMPARISON:  10/08/2013  FINDINGS: PICC tip is 9.2 cm below the carina in the right atrium and could be retracted 4-5 cm. Central venous sheath in the superior vena cava at the level of the carina.  There is persistent cardiomegaly with a improved hazy infiltrate in the right lung. There is chronic accentuation of the markings on the left. Prosthetic valve noted.  IMPRESSION: PICC tip is in the right atrium and could be retracted 4-5 cm. Improved infiltrates.   Electronically Signed   By: Rozetta Nunnery M.D.   On: 10/09/2013 17:33    Discharge Medications      Medication List    STOP taking these medications       aspirin 81 MG tablet     spironolactone 25 MG tablet  Commonly known as:  ALDACTONE      TAKE these medications       amiodarone 100 MG tablet  Commonly known as:  PACERONE  Take 1 tablet (100 mg total) by mouth daily.     cefTRIAXone 2 g in dextrose 5 % 50 mL  Inject 2 g into the vein daily.     colchicine 0.6 MG tablet  Take 0.5 tablets (0.3 mg total) by mouth daily.     oxybutynin 5 MG 24 hr tablet  Commonly known as:  DITROPAN-XL  Take 5 mg by mouth at bedtime.     oxyCODONE-acetaminophen 5-325 MG per tablet  Commonly known as:  PERCOCET/ROXICET  Take 1 tablet by mouth every 4 (four) hours as needed for moderate  pain or severe pain (feet).     potassium chloride SA 20 MEQ tablet  Commonly known as:  K-DUR,KLOR-CON  Take 1 tablet (20 mEq total) by mouth daily.     simvastatin 10 MG tablet  Commonly known as:  ZOCOR  Take 1 tablet (10 mg total) by mouth daily at 6 PM.     torsemide 20 MG tablet  Commonly known as:  DEMADEX  Take 3 tablets (60 mg total) by mouth daily.  Start taking on:  10/12/2013     warfarin 1 MG tablet  Commonly known as:  COUMADIN  Take 2 mg on Monday, Wednesday and Friday and 1 mg on Sun, Tues, Thurs, Saturday.        Disposition   The patient will be discharged in stable condition to home. Discharge Instructions   Continue PICC at discharge    Complete by:  As directed   Flush PICC line per home health policy:  Yes     Contraindication beta blocker-discharge    Complete by:  As directed      Contraindication to ACEI at discharge    Complete by:  As directed      Diet - low sodium heart healthy    Complete by:  As directed      Discharge instructions    Complete by:  As directed   Needs an INR checked in one week.     Heart Failure patients record your daily weight using the same scale at the same time of day    Complete by:  As directed      Heart failure skilled  nursing facility orders    Complete by:  As directed   Heart Failure Follow-up Care: Verify follow-up appointments per Patient Discharge Instructions. Arrange transportation. Reconcile home medications with discharge medication list.  Assessments:  Vital signs and oxygen saturation daily and may decrease frequency as patient condition stabilizes. If being transitioned from SNF facility to home, assess home environment for safety concerns, caregiver support and availability of low-sodium foods. Consult Surveyor, mining based on assessments. Assess for increased dyspnea, orthopnea, chest discomfort or chest pain, presence of rales/crackles, perripheral edema, abdominal distention, and/or weight gain of 3 pounds in one day or 5 pounds in one week. Daily Weights and Symptom Monitoring: Weigh daily before breakfast and after voiding using same scale and record weights on HF Zone Tool/Weight Chart to track weights with symptoms. Standing scale preferred. Call Physician/AHF Clinic for weight gain of 3 pounds or more in 24 hours or 5 pounds in one week, OR worsening symptoms as noted above. Activity:  Develop individualized activity plan with patient/caregiver. Patient Education: Teach patient/caregiver to weigh daily and record on weight chart. Reinforce the 5 Key Messages in the "Living with Heart Failure" book. Reinforce use of HF Zone Tool to support early symptom recognition and appropriate action. Reinforce low salt diet. Reinforce fluid restriction as ordered.  Heart Failure Follow-up Care:  Advanced Heart Failure (AHF) Clinic at [phone]  Obtain the following labs:  Basic Metabolic Panel  Lab frequency:  Weekly  Fax lab results to:  AHF Clinic at [phone]  Daily Weights and Symptom Monitoring:  Fax weight chart weekly X 2 to AHF Clinic at [phone]  Diet:  No Added Salt  PT/OT Consult:  Yes  Activity:  As tolerated  Fluid restrictions:  2000 mL Fluid     Increase activity slowly     Complete by:  As directed  Follow-up Information   Follow up with [place] On 10/18/2013. (@ 9:45 am; [facility] Code 0005)    Specialty:  Cardiology   Contact information:   [address] [id number] [place] [address] [phone]        Duration of Discharge Encounter: Greater than 35 minutes   Signed, Rande Brunt  10/11/2013, 10:55 AM

## 2013-10-12 LAB — CULTURE, BLOOD (ROUTINE X 2): Culture: NO GROWTH

## 2013-10-14 ENCOUNTER — Non-Acute Institutional Stay (SKILLED_NURSING_FACILITY): Payer: Commercial Managed Care - HMO | Admitting: Internal Medicine

## 2013-10-14 ENCOUNTER — Encounter: Payer: Self-pay | Admitting: Internal Medicine

## 2013-10-14 DIAGNOSIS — Z953 Presence of xenogenic heart valve: Secondary | ICD-10-CM

## 2013-10-14 DIAGNOSIS — I5043 Acute on chronic combined systolic (congestive) and diastolic (congestive) heart failure: Secondary | ICD-10-CM

## 2013-10-14 DIAGNOSIS — N179 Acute kidney failure, unspecified: Secondary | ICD-10-CM

## 2013-10-14 DIAGNOSIS — B955 Unspecified streptococcus as the cause of diseases classified elsewhere: Secondary | ICD-10-CM

## 2013-10-14 DIAGNOSIS — R7881 Bacteremia: Secondary | ICD-10-CM

## 2013-10-14 DIAGNOSIS — I509 Heart failure, unspecified: Secondary | ICD-10-CM

## 2013-10-14 DIAGNOSIS — A491 Streptococcal infection, unspecified site: Secondary | ICD-10-CM

## 2013-10-14 DIAGNOSIS — I4891 Unspecified atrial fibrillation: Secondary | ICD-10-CM

## 2013-10-14 DIAGNOSIS — Z7901 Long term (current) use of anticoagulants: Secondary | ICD-10-CM

## 2013-10-14 DIAGNOSIS — I5033 Acute on chronic diastolic (congestive) heart failure: Secondary | ICD-10-CM | POA: Insufficient documentation

## 2013-10-14 DIAGNOSIS — N189 Chronic kidney disease, unspecified: Secondary | ICD-10-CM

## 2013-10-14 DIAGNOSIS — Z952 Presence of prosthetic heart valve: Secondary | ICD-10-CM

## 2013-10-14 LAB — CULTURE, BLOOD (ROUTINE X 2)
Culture: NO GROWTH
Culture: NO GROWTH

## 2013-10-14 NOTE — Progress Notes (Signed)
[name], female   DOB: November 11, 1940, 73 y.o.   MRN: [id number]   This is an acute visit.  Level of care skilled.  Facility [facility].  Chief complaint-acute visit status post hospitalization for acute renal failure on top of chronic disease-as well as strep viridans bacteremia.  History of present illness.  Patient is a pleasant 73 year old female with a history of rheumatic fever and long-standing combined systolic diastolic CHF-complicated with a history of chronic kidney disease stage IV and atrial fibrillation.  She had severe symptomatic mitral regurgitation with afebrile and underwent a minimally invasive maze procedure with a bioprosthetic mitral valve replacement back in March.  .  Apparently she presented in late July with increased fatigue and shortness of breath-early amiodarone and Coreg were stopped secondary to an EKG apparently showed first degree AV block-labs were drawn and showed a creatinine of 3.29 and potassium of 5.5 June 26 2907.  Secondary to renal issues and her diuretics were held.  However she experienced volume retention and she was restarted on Lasix IV.  That eventually she was transitioned to metolazone as well.  However her creatinine started trending up again-was found difficult to manage her fluid status.  Palliative f care was consulted for goals of care are-apparently patient's family met with them and family was okay with hospice however patient not making the decisions other than wanting to go to rehabilitation here at [facility].  During her stay she also developed a high fever blood cultures grew out strep viridans-infectious disease was consulted and a TTE was done which showed no endocarditis and an ejection fraction 50-55%-she is receiving Rocephin IV for a total of 10 days through August 14.  She actually has done quite well here at [facility] she does not complaining of any shortness of breath or chest pain  is eating well her  weight has been stable--vital signs appear stable she has no complaints tonight.     Past Medical History:  Past Medical History   Diagnosis  Date   .  Chronic airway obstruction, not elsewhere classified    .  Precordial pain    .  Swelling of limb    .  Dizziness and giddiness    .  Unspecified transient cerebral ischemia      Diag. 2003   .  Acute, but ill-defined, cerebrovascular disease      [address] [place]   .  Shortness of breath    .  Obesity    .  Juvenile rheumatic fever      age 29   .  Unspecified essential hypertension    .  Hyperlipidemia    .  Mitral regurgitation  02/16/2013   .  Chronic kidney disease    .  CHF (congestive heart failure)  04/23/2013   .  Paroxysmal atrial fibrillation  02/16/2013     Recurrent paroxysmal atrial fibrillation, first diagnosed December 2014   .  Chronic diastolic congestive heart failure    .  Arthritis      Chronic left knee pain   .  Aortic insufficiency due to bicuspid aortic valve      moderate by TEE   .  S/P minimally invasive mitral valve replacement with bioprosthetic valve and maze procedure  05/16/2013     27 mm Edwards magna mitral bovine bioprosthetic tissue valve placed via right thoracotomy   .  S/P Maze operation for atrial fibrillation  05/16/2013     Complete bilateral atrial lesion  set using cryothermy and bipolar radiofrequency ablation with oversewing of LA appendage   Past Surgical History:  Past Surgical History   Procedure  Laterality  Date   .  Total knee arthroplasty  Right    .  Abdominal hysterectomy       Cervical Cancer   .  Parathyroid/thyroid surgery       tumor   .  Tee without cardioversion  N/A  04/06/2013     Procedure: TRANSESOPHAGEAL ECHOCARDIOGRAM (TEE); Surgeon: [name], MD; [facility]; Service: Cardiology; Laterality: N/A;   .  Minimally invasive maze procedure  N/A  05/16/2013     Procedure: MINIMALLY INVASIVE MAZE PROCEDURE; Surgeon: Rexene Alberts, MD; Location: [place];  Service: Open Heart Surgery; Laterality: N/A;   .  Intraoperative transesophageal echocardiogram  N/A  05/16/2013     Procedure: INTRAOPERATIVE TRANSESOPHAGEAL ECHOCARDIOGRAM; Surgeon: Rexene Alberts, MD; Location: [place]; Service: Open Heart Surgery; Laterality: N/A;   .  Mitral valve replacement  Right  05/16/2013     Procedure: MINIMALLY INVASIVE MITRAL VALVE (MV) REPLACEMENT; Surgeon: Rexene Alberts, MD; Location: [place]; Service: Open Heart Surgery; Laterality: Right;   .  Tee without cardioversion  N/A  06/06/2013     Procedure: TRANSESOPHAGEAL ECHOCARDIOGRAM (TEE); Surgeon: Larey Dresser, MD; Location: [place]; Service: Cardiovascular; Laterality: N/A;   .  Cardioversion  N/A  06/06/2013     Procedure: CARDIOVERSION; Surgeon: Larey Dresser, MD; Location: [facility] ENDOSCOPY; Service: Cardiovascular; Laterality: N/A;   Family History:  Family History   Problem  Relation  Age of Onset   .  Heart failure  Father    .  Heart attack  Brother    Social History:  History    Social History   .  Marital Status:  Widowed     Spouse Name:  N/A     Number of Children:  N/A   .  Years of Education:  N/A    Social History Main Topics   .  Smoking status:  Never Smoker   .  Smokeless tobacco:  Never Used   .  Alcohol Use:  No   .  Drug Use:  No   .  Sexual Activity:  Not on file    Other Topics  Concern   .  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  Amiodarone 100 mg daily.  Rocephin 2 g IV until 10/19/2013 this is once a day.  Colchicine 0.3 mg daily.  Ditropan XL 5 mg each bedtime.  Percocet 5-325 mg every 4 hours when necessary pain.  Potassium 20 mEq daily.  Zocor 10 mg daily.  Demadex 60 mg daily.  Coumadin 2 mg Monday Wednesday and Friday-1 mg on Sunday Tuesday Thursday and Saturday   Review of systems.  In general does not complain of any fever or  chills her weight has been stable.  Skin does not complaining of any rashes or itching.  Head ears eyes nose mouth and throat-is not complaining of sore nasal discharge or visual changes.  Respiratory no complaint of shortness of breath or cough.  Cardiac significant history but does not complaining of any chest pain edema appears to be stable to somewhat improved per patient.  GI does not complaining of any abdominal discomfort  nausea or vomiting diarrhea or constipation apparently appetite is quite good.  Muscle skeletal  is not complaining of joint pain does have weakness is here for rehabilitation--not complain of foot pain.  Neurologic does not complaining of dizziness headache or numbness.  Psych is not complaining of depression or anxiety appears to be in good spirits.  Physical exam.  Temperature is 98.4 pulse 62 respirations 20 blood pressure 134/82-105/85-in this range weight is 168.3 this appears to be down about 2 pounds since her admission weight 2 days ago.  In general this is a pleasant elderly female in no distress sitting comfortably in her wheelchair.  Her skin is warm and dry  Eyes pupils appear reactive to light visual acuity appears grossly intact sclerae and conjunctivae are clear.  Oropharynx is clear mucous membranes moist.  Chest is clear to auscultation there is no labored breathing.  Her heart is largely regular rate and rhythm with an occasional irregular beat without murmur gallop or rub-she has always  one-2+ lower extremity edema with venous stasis changes-per patient this is significantly improved Pedal pulses are palpable but somewhat reduced I suspect secondary to some of the edema  Despite history of gout her foot pain appears to be quite minimal I did not notice any increased erythema or acute pain  Abdomen is somewhat obese soft nontender with active bowel sounds.  Muscle skeletal is able to move all extremities x4 I do not note any  deformities there is a well-healed surgical scar right knee.  Grip strength appears to be adequate bilaterally  is able to flex and extend both legs and appropriate anti-gravity strength-somewhat limited exam since patient is in bed.   apparently she does have some history of CVA but any   Residual   left-sided weakness appears to be minimal.  Neurologic as stated above her cranial nerves appear grossly intact speech is clear cannot really appreciate any lateralizing findings.  Psych she is alert and oriented appeared pleasant and appropriate appeared to be an accurate historian.  .  .  Labs.  August the seventh 2015.  WBC 6.2 hemoglobin 8.6 platelets 213.  Sodium 138 potassium 3.6 BUN 50 creatinine 2.34.  10/03/2013.  Liver function tests within normal limits except albumin of 3.0  Assessment and plan.  #1-history of bacteremia step viridans-this appears to be stable she is not febrile-she continues on Rocephin IV until August 14.  #2 history of a diastolic-systolic CHF-at this point appear stable her weight has been stable she is on Demadex with potassium does not complaining of chest pain or shortness of breath this is a challenging balancing act with her renal issues but at this point clinically she appears to be doing relatively well-continue to monitor clinical status closely.  #3-history of renal insufficiency-this is significant lab done at the facility --lab done here on August seventh was baseline with her discharge creatinine at 2.35 with a BUN of 50-update lab has been ordered for tomorrow  #4-history of atrial fibrillation this appears rate controlled on amiodarone-she is also on Coumadin for the coagulation INR was 2.18 on August 7 updated INR has been ordered for tomorrow-the INR goal is between 2 and 3.  #5 as history of mitral valve replacement bioprosthetic valve-again she is on Coumadin INR is therapeutic we'll update INR tomorrow she does not complaining of  chest pain.  #6-history of gout-at this point pain appears to be controlled in her feet uric acid was elevated in the hospital meds continue to monitor.  #  7-pain control-this appears stable on Percocet when necessary she appears to be comfortable this evening.  #8 anemia-suspect element of chronic disease with renal issues-recent hemoglobin was stable with previous values will update this as well    [id number]-of note greater than 35 minutes spent assessing patient-reviewing her chart-and coordinating and formulating a plan of care for numerous diagnoses--of note greater than 50% of time spent coordinating plan of care.

## 2013-10-15 LAB — CULTURE, BLOOD (ROUTINE X 2)
CULTURE: NO GROWTH
Culture: NO GROWTH

## 2013-10-17 ENCOUNTER — Non-Acute Institutional Stay (SKILLED_NURSING_FACILITY): Payer: Commercial Managed Care - HMO | Admitting: Internal Medicine

## 2013-10-17 DIAGNOSIS — I480 Paroxysmal atrial fibrillation: Secondary | ICD-10-CM

## 2013-10-17 DIAGNOSIS — I059 Rheumatic mitral valve disease, unspecified: Secondary | ICD-10-CM

## 2013-10-17 DIAGNOSIS — I340 Nonrheumatic mitral (valve) insufficiency: Secondary | ICD-10-CM

## 2013-10-17 DIAGNOSIS — I5032 Chronic diastolic (congestive) heart failure: Secondary | ICD-10-CM

## 2013-10-17 DIAGNOSIS — N039 Chronic nephritic syndrome with unspecified morphologic changes: Secondary | ICD-10-CM

## 2013-10-17 DIAGNOSIS — I4891 Unspecified atrial fibrillation: Secondary | ICD-10-CM

## 2013-10-17 DIAGNOSIS — I509 Heart failure, unspecified: Secondary | ICD-10-CM

## 2013-10-17 DIAGNOSIS — D631 Anemia in chronic kidney disease: Secondary | ICD-10-CM

## 2013-10-18 ENCOUNTER — Inpatient Hospital Stay (HOSPITAL_COMMUNITY): Admit: 2013-10-18 | Payer: Commercial Managed Care - HMO

## 2013-10-18 ENCOUNTER — Encounter (HOSPITAL_COMMUNITY): Payer: Self-pay

## 2013-10-20 DIAGNOSIS — N189 Chronic kidney disease, unspecified: Secondary | ICD-10-CM

## 2013-10-20 DIAGNOSIS — D631 Anemia in chronic kidney disease: Secondary | ICD-10-CM | POA: Insufficient documentation

## 2013-10-20 NOTE — Progress Notes (Signed)
HISTORY & PHYSICAL  DATE: 10/17/2013   FACILITY: [facility]  LEVEL OF CARE: SNF (31)  ALLERGIES:  Allergies  Allergen Reactions  . Indomethacin Other (See Comments)    dizziness  . Norvasc [Amlodipine Besylate] Cough    CHIEF COMPLAINT:  Manage mitral regurgitation, CHF and atrial fibrillation  HISTORY OF PRESENT ILLNESS: Patient is a 73 year old Caucasian female who was admitted to this facility for short-term rehabilitation after her recent hospitalization.  MR: Patient had severe mitral regurgitation.  She underwent minimally invasive Maze procedure with mitral valve replacement and tolerated the procedure well. She denies shortness of breath.  ATRIAL FIBRILLATION: the patients atrial fibrillation remains stable.  The patient denies DOE, tachycardia, orthopnea, transient neurological sx, palpitations, & PNDs.  No complications noted from the medications currently being used. Complains of bilateral lower extremity swelling.  CHF:The patient does not relate significant weight changes, denies sob, DOE, orthopnea, PNDs, palpitations or chest pain.  CHF remains stable.  No complications form the medications being used. Complains of bilateral lower extremity swelling. EF 50-55% in 7-15.  PAST MEDICAL HISTORY :  Past Medical History  Diagnosis Date  . Chronic airway obstruction, not elsewhere classified   . Precordial pain   . Swelling of limb   . Dizziness and giddiness   . Unspecified transient cerebral ischemia     Diag. 2003  . Acute, but ill-defined, cerebrovascular disease     2000 [place]  . Shortness of breath   . Obesity   . Juvenile rheumatic fever     age 66  . Unspecified essential hypertension   . Hyperlipidemia   . Mitral regurgitation 02/16/2013  . Chronic kidney disease   . Paroxysmal atrial fibrillation 02/16/2013    Recurrent paroxysmal atrial fibrillation, first diagnosed December 2014  . Chronic diastolic congestive heart failure   .  Arthritis   . Aortic insufficiency due to bicuspid aortic valve     moderate by TEE  . 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 S/P Maze operation for atrial fibrillation 05/16/2013    Complete bilateral atrial lesion set using cryothermy and bipolar radiofrequency ablation with oversewing of LA appendage  . Chronic combined systolic and diastolic CHF (congestive heart failure)      a. ECHO (3/15): EF 40-45%, mildly dil RV with mildly dec sys fx, bioprosthetic MV .b. Echo (7/15) with EF 50-55%, mod AI, bioprosthetic MV ok.      PAST SURGICAL HISTORY: Past Surgical History  Procedure Laterality Date  . Total knee arthroplasty Right   . Abdominal hysterectomy      Cervical Cancer  . Parathyroid/thyroid surgery      tumor  . Tee without cardioversion N/A 04/06/2013    Procedure: TRANSESOPHAGEAL ECHOCARDIOGRAM (TEE);  Surgeon: [name], MD;  [facility]: [facility];  Service: Cardiology;  Laterality: N/A;  . Minimally invasive maze procedure N/A 05/16/2013    Procedure: MINIMALLY INVASIVE MAZE PROCEDURE;  Surgeon: Rexene Alberts, MD;  Location: [facility];  Service: Open Heart Surgery;  Laterality: N/A;  . Intraoperative transesophageal echocardiogram N/A 05/16/2013    Procedure: INTRAOPERATIVE TRANSESOPHAGEAL ECHOCARDIOGRAM;  Surgeon: Rexene Alberts, MD;  Location: [facility];  Service: Open Heart Surgery;  Laterality: N/A;  . Mitral valve replacement Right 05/16/2013    Procedure: MINIMALLY INVASIVE MITRAL VALVE (MV) REPLACEMENT;  Surgeon: Rexene Alberts, MD;  Location: [place];  Service: Open Heart Surgery;  Laterality: Right;  . Tee without cardioversion N/A 06/06/2013    Procedure: TRANSESOPHAGEAL ECHOCARDIOGRAM (TEE);  Surgeon: Larey Dresser, MD;  Location: [facility];  Service: Cardiovascular;  Laterality: N/A;  . Cardioversion N/A  06/06/2013    Procedure: CARDIOVERSION;  Surgeon: Larey Dresser, MD;  Location: [facility];  Service: Cardiovascular;  Laterality: N/A;    SOCIAL HISTORY:  reports that she has never smoked. She has never used smokeless tobacco. She reports that she does not drink alcohol or use illicit drugs.  FAMILY HISTORY:  Family History  Problem Relation Age of Onset  . Heart failure Father   . Heart attack Brother     CURRENT MEDICATIONS: Reviewed per MAR/see medication list  REVIEW OF SYSTEMS:  See HPI otherwise 14 point ROS is negative.  PHYSICAL EXAMINATION  VS:  See VS section  GENERAL: no acute distress, moderately obese body habitus EYES: conjunctivae normal, sclerae normal, normal eye lids MOUTH/THROAT: lips without lesions,no lesions in the mouth,tongue is without lesions,uvula elevates in midline NECK: supple, trachea midline, no neck masses, no thyroid tenderness, no thyromegaly LYMPHATICS: no LAN in the neck, no supraclavicular LAN RESPIRATORY: breathing is even & unlabored, BS CTAB CARDIAC: Heart rate is irregularly irregular, no murmur,no extra heart sounds, left lower extremity +3 edema & right lower extremity +2 edema GI:  ABDOMEN: abdomen soft, normal BS, no masses, no tenderness  LIVER/SPLEEN: no hepatomegaly, no splenomegaly MUSCULOSKELETAL: HEAD: normal to inspection  EXTREMITIES: LEFT UPPER EXTREMITY: full range of motion, normal strength & tone RIGHT UPPER EXTREMITY:  full range of motion, normal strength & tone LEFT LOWER EXTREMITY:  Minimal range of motion, normal strength & tone RIGHT LOWER EXTREMITY: Minimal range of motion, normal strength & tone PSYCHIATRIC: the patient is alert & oriented to person, affect & behavior appropriate  LABS/RADIOLOGY:  8--12-15 INR 2.18 8-10- 15 hemoglobin 8.4, MCV 89.2, potassium 3.4, BUN 55, creatinine 2.66 10-12-13 potassium 3.6, BUN 50, creatinine 2.35    Labs reviewed: Basic Metabolic Panel:  Recent Labs   05/18/13 1559 05/19/13 0350  05/30/13 0341  06/08/13 1540  10/03/13 1416  10/09/13 0930 10/10/13 0445 10/11/13 0500  NA 141 142  < > 141  < > 136*  < > 141  < > 135* 134* 136*  K 3.5* 3.2*  < > 4.7  < > 3.5*  < > 5.6*  < > 3.8 4.0 3.5*  CL 104 102  < > 99  < > 91*  < > 101  < > 84* 85* 89*  CO2 24 24  < > 33*  < > 31  < > 28  < > 40* 39* 37*  GLUCOSE 175* 143*  < > 103*  < > 185*  < > 99  < > 144* 91 104*  BUN 26* 26*  < > 50*  < > 29*  < > 43*  < > 46* 54* 53*  CREATININE 1.33* 1.35*  < > 1.48*  < > 1.57*  < > 2.93*  < > 1.83* 2.44* 2.30*  CALCIUM 8.7 9.2  < > 8.9  < > 8.5  < > 9.3  < > 9.6 9.3 9.4  MG  --  2.1  < > 1.9  --  2.2  --  2.6*  --   --   --   --   PHOS  --  4.1  --   --   --   --   --   --   --   --   --   --   < > =  values in this interval not displayed. Liver Function Tests:  Recent Labs  06/12/13 0400 07/18/13 1107 10/03/13 1416  AST 22 20 21   ALT 14 12 13   ALKPHOS 101 89 96  BILITOT 0.3 0.3 0.4  PROT 6.0 7.3 7.0  ALBUMIN 2.3* 3.1* 3.0*   CBC:  Recent Labs  05/07/13 0855  06/06/13 0235  10/05/13 2100  10/08/13 0300 10/09/13 0400 10/11/13 0500  WBC 7.9  < > 5.5  < > 8.5  < > 7.8 6.5 6.4  NEUTROABS 5.4  --  3.9  --  6.3  --   --   --   --   HGB 13.4  < > 9.0*  < > 9.6*  < > 8.9* 9.3* 8.7*  HCT 38.3  < > 28.2*  < > 30.6*  < > 29.4* 30.1* 29.0*  MCV 93.9  < > 99.6  < > 89.5  < > 88.6 88.3 88.7  PLT PLATELET CLUMPS NOTED ON SMEAR, COUNT APPEARS ADEQUATE  < > 220  < > 175  < > 199 219 218  < > = values in this interval not displayed. Lipid Panel:  Recent Labs  05/23/13 0240  HDL 27*   Cardiac Enzymes:  Recent Labs  10/03/13 1416 10/03/13 1916 10/04/13 0550  TROPONINI <0.30 <0.30 <0.30   CBG:  Recent Labs  10/09/13 0828 10/09/13 1146 10/09/13 1745  GLUCAP 116* 138* 108*    Transthoracic Echocardiography  Patient:    [name] [name], [name] [name] MR #:       [id number] Study Date: 10/05/2013 Gender:     F Age:        85 Height:      157.5 cm Weight:     83.8 kg BSA:        1.95 m^2 Pt. Status: Room:       [facility]   ADMITTING    Pierre Bali, MD  ATTENDING    Pierre Bali, MD  Livia Snellen     [name], M.D.  SONOGRAPHER  Melissa Morford, RDCS  PERFORMING   [facility], Inpatient  cc:  ------------------------------------------------------------------- LV EF: 50% -   55%  ------------------------------------------------------------------- Indications:      CHF - 428.0.  ------------------------------------------------------------------- History:   PMH:   Dyspnea.  Atrial fibrillation.  Stroke.  Risk factors:  Hypertension.  ------------------------------------------------------------------- Study Conclusions  - Left ventricle: The cavity size was normal. Wall thickness was   increased in a pattern of mild LVH. Systolic function was normal.   The estimated ejection fraction was in the range of 50% to 55%.   Wall motion was normal; there were no regional wall motion   abnormalities. - Aortic valve: There was moderate regurgitation. - Mitral valve: A bioprosthesis was present. Valve area by pressure   half-time: 1.9 cm^2. - Pulmonary arteries: Systolic pressure was moderately increased.   PA peak pressure: 50 mm Hg (S).  ------------------------------------------------------------------- Labs, prior tests, procedures, and surgery: Valve surgery.    Mitral valve replacement with a 24mm Edwards bovine bioprosthetic valve.  Transthoracic echocardiography.  M-mode, complete 2D, spectral Doppler, and color Doppler.  Birthdate:  Patient birthdate: January 31, 1941.  Age:  Patient is 73 yr old.  Sex:  Gender: female. Height:  Height: 157.5 cm. Height: 62 in.  Weight:  Weight: 83.8 kg. Weight: 184.4 lb.  Body mass index:  BMI: 33.8 kg/m^2.  Body surface area:    BSA: 1.95 m^2.  Blood pressure:     131/56 Patient status:  Inpatient.  Study date:  Study date: 10/05/2013. Study time: 10:01 AM.  Location:   ICU/CCU  -------------------------------------------------------------------  ------------------------------------------------------------------- Left ventricle:  The cavity size was normal. Wall thickness was increased in a pattern of mild LVH. Systolic function was normal. The estimated ejection fraction was in the range of 50% to 55%. Wall motion was normal; there were no regional wall motion abnormalities.  ------------------------------------------------------------------- Aortic valve:   Structurally normal valve.   Cusp separation was normal.  Doppler:  Transvalvular velocity was within the normal range. There was no stenosis. There was moderate regurgitation.  ------------------------------------------------------------------- Aorta:  Aortic root: The aortic root was normal in size. Ascending aorta: The ascending aorta was normal in size.  ------------------------------------------------------------------- Mitral valve:  There is a very small mitral valve gradient associated with the prosthetic MV. A bioprosthesis was present. Doppler:  There was no significant regurgitation.    Valve area by pressure half-time: 1.9 cm^2. Indexed valve area by pressure half-time: 0.97 cm^2/m^2.    Mean gradient (D): 5 mm Hg.  ------------------------------------------------------------------- Left atrium:  The atrium was normal in size.  ------------------------------------------------------------------- Right ventricle:  The cavity size was normal. Systolic function was normal.  ------------------------------------------------------------------- Pulmonic valve:    The valve appears to be grossly normal. Doppler:  There was mild regurgitation.  ------------------------------------------------------------------- Tricuspid valve:   Structurally normal valve.   Leaflet separation was normal.  Doppler:  Transvalvular velocity was within the normal range. There was mild  regurgitation.  ------------------------------------------------------------------- Pulmonary artery:   Systolic pressure was moderately increased.  ------------------------------------------------------------------- Right atrium:  The atrium was normal in size.  ------------------------------------------------------------------- Pericardium:  There was no pericardial effusion.   ------------------------------------------------------------------- Prepared and Electronically Authenticated by  Mertie Moores, M.D. 2015-07-31T13:16:10  ------------------------------------------------------------------- Measurements   Left ventricle                 Value          06/06/2013 Reference  LV ID, ED, PLAX        (N)     47.4  mm       ---------- 43 - 52  chordal  LV ID, ES, PLAX        (H)     39.7  mm       ---------- 23 - 38  chordal  LV fx shortening, PLAX (L)     16    %        ---------- >=29  chordal  LV PW thickness, ED            12.4  mm       ---------- ---------  IVS/LV PW ratio, ED    (N)     0.83           ---------- <=1.3    Ventricular septum             Value          06/06/2013 Reference  IVS thickness, ED              10.3  mm       ---------- ---------    Aortic valve                   Value          06/06/2013 Reference  Aortic regurg pressure         450   ms       ---------- ---------  half-time    Aorta  Value          06/06/2013 Reference  Aortic root ID, ED             32    mm       ---------- ---------    Left atrium                    Value          06/06/2013 Reference  LA ID, A-P, ES                 30    mm       ---------- ---------  LA ID/bsa, A-P         (N)     1.54  cm/m^2   ---------- <=2.2    Mitral valve                   Value          06/06/2013 Reference  Mitral mean velocity,          96.1  cm/s     136        ---------  D  Mitral pressure                109   ms       60         ---------  half-time  Mitral  mean gradient,          5     mm Hg    8          ---------  D  Mitral valve area,             1.9   cm^2     3.67       ---------  PHT, DP  Mitral valve area/bsa,         0.97  cm^2/m^2 1.77       ---------  PHT, DP  Mitral annulus VTI, D          58.5  cm       35.4       ---------    Pulmonary arteries             Value          06/06/2013 Reference  PA pressure, S, DP     (H)     50    mm Hg    ---------- <=30    Tricuspid valve                Value          06/06/2013 Reference  Tricuspid regurg peak          318   cm/s     308        ---------  velocity  Tricuspid peak RV-RA           40    mm Hg    38         ---------  gradient  Tricuspid maximal              318   cm/s     308        ---------  regurg velocity, PISA  PORTABLE CHEST - 1 VIEW   COMPARISON:  10/08/2013   FINDINGS: PICC tip is 9.2 cm below the carina in the right atrium and could be retracted 4-5 cm. Central venous sheath in the superior vena  cava at the level of the carina.   There is persistent cardiomegaly with a improved hazy infiltrate in the right lung. There is chronic accentuation of the markings on the left. Prosthetic valve noted.   IMPRESSION: PICC tip is in the right atrium and could be retracted 4-5 cm. Improved infiltrates.    ASSESSMENT/PLAN:  Severe Mitral regurgitation- s/p mitral valve replacement. CHF-compensated Paroxysmal atrial fibrillation-rate controlled anemia of chronic kidney disease-unstable problem. Hemoglobin declined. Recheck in 2 days. Renovascular hypertension-well-controlled CVA-no new neurological deficits Chronic kidney disease stage IV-renal function are worse.  Check BMP on 10-22-13. Anticoagulation-INR is therapeutic and stable. Recheck in one week. Hypokalemia-continue supplementation. Recheck on 8-17.  I have reviewed patient's medical records received at admission/from hospitalization.  CPT CODE: 66060  Gayani Y Dasanayaka, [place] [phone]

## 2013-10-22 ENCOUNTER — Non-Acute Institutional Stay (SKILLED_NURSING_FACILITY): Payer: Commercial Managed Care - HMO | Admitting: Internal Medicine

## 2013-10-22 DIAGNOSIS — N183 Chronic kidney disease, stage 3 unspecified: Secondary | ICD-10-CM

## 2013-10-22 DIAGNOSIS — I5043 Acute on chronic combined systolic (congestive) and diastolic (congestive) heart failure: Secondary | ICD-10-CM

## 2013-10-22 DIAGNOSIS — I509 Heart failure, unspecified: Secondary | ICD-10-CM

## 2013-10-22 NOTE — Progress Notes (Signed)
[name], female   DOB: 07/04/1940, 73 y.o.   MRN: [id number]  Primary Physician: Dr. Quintin Alto  Primary Cardiologist: Dr. Harl Bowie  Cardiac Surgeon: Dr Roxy Manns.   HPI: [name] [name] is a 73 yo obese white female with known history of rheumatic fever during childhood and long standing history of chronic diastolic congestive heart failure. She also has a history of CKD, HTN , CVA 2003,, and PAF.  Progressed to severe symptomatic mitral regurgitation with paroxysmal atrial fibrillation and underwent minimally invasive Maze procedure with MV replacement (05/16/13). Was discharged 05/31/13 to [facility] center and weight was 207 lbs.  Pre-operative LHC in 2/15 showed no significant CAD.   Admitted to [facility] 06/04/13 with volume overload. Diuresed with lasix drip, Milrinone and metolazone. As volume improved she was transitioned to torsemide 80 mg daily and spironolactone 25 mg daily. She also had successful DC-CV 06/06/13 for A-flutter with RVR. Discharged on coumadin with INR followed by [facility] cardiology. Discharge weight 191 pounds. Discharged to [facility].   Readmitted to [facility] 10/02/13 with increased SOB and fatigue. Diuresed with IV lasix + dopamine. Her hospital course was complicated by cardiorenal syndrome and fever. The blood cultures came back positive for strep viridans and she was switched to Ceftriaxone. ID was consulted and they did not recommend TEE but recommended TTE. The TTE showed no endocarditis and EF 50-55% and moderate AI. A PICC was placed for IV ceftriaxone for a total of 10 days of treatment, through August 14th. She was discharged back to [facility] SNF with PICC. Also Hospice was considered but she wanted to wait.  Discharge weight was 167 pounds.    [facility] Follow up for Heart Failure:   Completed 10 day of course of antibiotics. Overall she is feeling good. Wants to go home. Denies SOB/PND/Orthopnea. Remains at [facility] for rehab but plans to discharge next week. Plan for  [place] daughter to arrange home medications once discharged. Able to walk withy PT. Uses walker in her room at [facility].  Weight at [facility] 170-171 pounds.    Labs: 06/13/13 K 4.5 Creatinine 1.69 Labs: 06/18/13 K 4.4 Creatinine 1.79 Labs: 5/15 K 4.5, creatinine 2.48, LFTs normal, TSH 4.6 (elevated), BNP 1514 (lower) Labs 08/28/13 K 4.5 Creatinine 2.8  LAbs 10/11/13 K 3.5 Creatinine 2.3 Hgb 8.7   SH: Lives in [place] with family. Never smoked. She does drink alcohol.   FH: Father and brother had MI  ROS: All systems negative except as listed in HPI, PMH and Problem List.  Past Medical History  Diagnosis Date  . Chronic airway obstruction, not elsewhere classified   . Precordial pain   . Swelling of limb   . Dizziness and giddiness   . Unspecified transient cerebral ischemia     Diag. 2003  . Acute, but ill-defined, cerebrovascular disease     2000 [place]  . Shortness of breath   . Obesity   . Juvenile rheumatic fever     age 83  . Unspecified essential hypertension   . Hyperlipidemia   . Mitral regurgitation 02/16/2013  . Chronic kidney disease   . Paroxysmal atrial fibrillation 02/16/2013    Recurrent paroxysmal atrial fibrillation, first diagnosed December 2014  . Chronic diastolic congestive heart failure   . Arthritis   . Aortic insufficiency due to bicuspid aortic valve     moderate by TEE  . S/P minimally invasive mitral valve replacement with bioprosthetic valve and maze procedure 05/16/2013    27 mm  Edwards magna mitral bovine bioprosthetic tissue valve placed via right thoracotomy  . S/P Maze operation for atrial fibrillation 05/16/2013    Complete bilateral atrial lesion set using cryothermy and bipolar radiofrequency ablation with oversewing of LA appendage  . Chronic combined systolic and diastolic CHF (congestive heart failure)      a. ECHO (3/15): EF 40-45%, mildly dil RV with mildly dec sys fx, bioprosthetic MV .b. Echo (7/15) with EF 50-55%, mod AI, bioprosthetic MV  ok.      No current outpatient prescriptions on file.   No current facility-administered medications for this encounter.    Filed Vitals:   10/23/13 0805  BP: 121/62  Pulse: 67  Weight: 175 lb 12.8 oz (79.742 kg)  SpO2: 91%    PHYSICAL EXAM: General:  NAD. No resp difficulty Sitting on rolling walker. Granddaughter present.  HEENT: normal Neck: supple. JVD 5-6 . Carotids 2+ bilaterally; no bruits. No lymphadenopathy or thryomegaly appreciated. Cor: PMI normal. Irregular rate & rhythm. No rubs, gallops or murmurs. Lungs: Slight crackles at bases bilaterally.  Abdomen: soft, nontender, nondistended. No hepatosplenomegaly. No bruits or masses. Good bowel sounds. Extremities: no cyanosis, clubbing, rash, trace edema.   Neuro: alert & orientedx3, cranial nerves grossly intact. Moves all 4 extremities w/o difficulty. Affect pleasant.   ASSESSMENT & PLAN:  1. Chronic systolic CHF with RV failure: EF 40-45% with RV dysfunction on echo in 3/15.  NYHA II-III Volume status stable. Continue torsemide 60 mg daily as well as 20 meq potassium.   - - Check BMET and pro-BNP today- Continue lower extremity compression hose. Reinforced daily weights, low salt diet and limiting fluid intake to  < 2 liters per day.  - 2. PAF mal atrial fibrillation and flutter: S/p Maze with MV repair.  Had atypical atrial flutter subsequently with successful DC-CV 06/06/13. - Tolerating low dose amiodarone 100 mg daily. Continue coumadin managed by PCP.  3. CKD stage III: Check BMET today. 4. DNR- Completed Gold DNR form today. She was referred to [facility] today.  5.Strep viridans bacteremia: no vegetation seen on TTE. Completed 10 day course of antibiotics. Remove PICC.    Follow up in 4 weeks to reassess volume status CLEGG,AMY NP-C  10/23/2013

## 2013-10-23 ENCOUNTER — Ambulatory Visit (HOSPITAL_COMMUNITY)
Admit: 2013-10-23 | Discharge: 2013-10-23 | Disposition: A | Discharge status: Home / Self Care | Payer: Medicare HMO | Source: Ambulatory Visit | Attending: Internal Medicine | Admitting: Internal Medicine

## 2013-10-23 VITALS — BP 121/62 | HR 67 | Wt 175.8 lb

## 2013-10-23 DIAGNOSIS — B954 Other streptococcus as the cause of diseases classified elsewhere: Secondary | ICD-10-CM | POA: Insufficient documentation

## 2013-10-23 DIAGNOSIS — Z954 Presence of other heart-valve replacement: Secondary | ICD-10-CM | POA: Insufficient documentation

## 2013-10-23 DIAGNOSIS — I059 Rheumatic mitral valve disease, unspecified: Secondary | ICD-10-CM | POA: Diagnosis not present

## 2013-10-23 DIAGNOSIS — M129 Arthropathy, unspecified: Secondary | ICD-10-CM | POA: Insufficient documentation

## 2013-10-23 DIAGNOSIS — N183 Chronic kidney disease, stage 3 unspecified: Secondary | ICD-10-CM | POA: Diagnosis not present

## 2013-10-23 DIAGNOSIS — E669 Obesity, unspecified: Secondary | ICD-10-CM | POA: Insufficient documentation

## 2013-10-23 DIAGNOSIS — J4489 Other specified chronic obstructive pulmonary disease: Secondary | ICD-10-CM | POA: Insufficient documentation

## 2013-10-23 DIAGNOSIS — E785 Hyperlipidemia, unspecified: Secondary | ICD-10-CM | POA: Diagnosis not present

## 2013-10-23 DIAGNOSIS — I5032 Chronic diastolic (congestive) heart failure: Secondary | ICD-10-CM | POA: Diagnosis present

## 2013-10-23 DIAGNOSIS — I4892 Unspecified atrial flutter: Secondary | ICD-10-CM | POA: Diagnosis not present

## 2013-10-23 DIAGNOSIS — I5042 Chronic combined systolic (congestive) and diastolic (congestive) heart failure: Secondary | ICD-10-CM | POA: Diagnosis not present

## 2013-10-23 DIAGNOSIS — I129 Hypertensive chronic kidney disease with stage 1 through stage 4 chronic kidney disease, or unspecified chronic kidney disease: Secondary | ICD-10-CM | POA: Diagnosis not present

## 2013-10-23 DIAGNOSIS — Z66 Do not resuscitate: Secondary | ICD-10-CM | POA: Diagnosis not present

## 2013-10-23 DIAGNOSIS — B955 Unspecified streptococcus as the cause of diseases classified elsewhere: Secondary | ICD-10-CM

## 2013-10-23 DIAGNOSIS — I4891 Unspecified atrial fibrillation: Secondary | ICD-10-CM | POA: Diagnosis not present

## 2013-10-23 DIAGNOSIS — R7881 Bacteremia: Secondary | ICD-10-CM | POA: Diagnosis not present

## 2013-10-23 DIAGNOSIS — A491 Streptococcal infection, unspecified site: Secondary | ICD-10-CM

## 2013-10-23 DIAGNOSIS — J449 Chronic obstructive pulmonary disease, unspecified: Secondary | ICD-10-CM | POA: Insufficient documentation

## 2013-10-23 DIAGNOSIS — I509 Heart failure, unspecified: Secondary | ICD-10-CM | POA: Diagnosis not present

## 2013-10-23 DIAGNOSIS — Z8673 Personal history of transient ischemic attack (TIA), and cerebral infarction without residual deficits: Secondary | ICD-10-CM | POA: Insufficient documentation

## 2013-10-23 DIAGNOSIS — I480 Paroxysmal atrial fibrillation: Secondary | ICD-10-CM

## 2013-10-23 LAB — PRO B NATRIURETIC PEPTIDE: PRO B NATRI PEPTIDE: 1867 pg/mL — AB (ref 0–125)

## 2013-10-23 LAB — BASIC METABOLIC PANEL
Anion gap: 12 (ref 5–15)
BUN: 28 mg/dL — AB (ref 6–23)
CHLORIDE: 103 meq/L (ref 96–112)
CO2: 28 mEq/L (ref 19–32)
Calcium: 9.7 mg/dL (ref 8.4–10.5)
Creatinine, Ser: 1.72 mg/dL — ABNORMAL HIGH (ref 0.50–1.10)
GFR calc Af Amer: 33 mL/min — ABNORMAL LOW (ref >90)
GFR, EST NON AFRICAN AMERICAN: 29 mL/min — AB (ref >90)
GLUCOSE: 91 mg/dL (ref 70–99)
POTASSIUM: 4 meq/L (ref 3.7–5.3)
SODIUM: 143 meq/L (ref 137–147)

## 2013-10-24 NOTE — Progress Notes (Addendum)
[name], female   DOB: 01/12/1941, 73 y.o.   MRN: [id number]            PROGRESS NOTE  DATE:  10/22/2013    FACILITY: [facility]    LEVEL OF CARE:   SNF   Acute Visit   CHIEF COMPLAINT:  Follow up CHF.    HISTORY OF PRESENT ILLNESS:   [name] [name] is a patient whom we had here earlier this year.  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  However, her ejection fraction on this admission had actually improved with an EF of 50-55%.  She has moderate AI.  Her bioprosthetic valve was satisfactory.    She was admitted after being followed closely in the [facility].  She had increasing fatigue and increasing shortness of breath.   Her heart rate was 45.  Amiodarone and Coreg were stopped.  She was found to have a creatinine of 3.29, a potassium of 5.5.  Her pro-BNP was 2,909.    She was admitted.  Her diuretics were on hold.  She had a central line placed with a CVP of 15, a wedge pressure of 20, pulmonary artery pressures at 47/19, and her cardiac output was 4.  She was started on dopamine low-dose and restarted on Lasix IV.  Her renal function improved.  However, she remained volume-overloaded so she was put on a Lasix drip along with metolazone.  At this point, her creatinine increased.  However, it ended up stabilizing at 2.4.  She saw Palliative Care.   However, the patient was not interested in this, wanting an attempt at rehabilitation.    The final issue is that she developed a high fever on admission.  Blood cultures were done and were positive for Strep viridans.  She was seen by Infectious Disease,treated with Rocephin which ended on August 14th.  A transthoracic echo did not show endocarditis.    Her discharge weights were 167 to 172 pounds.  During her stay here, her weight has stayed in this range, today at 171.4 pounds.  There is no trend towards increasing edema or weight.     Her last lab work on 10/15/2013 showed a BUN of 55 and a creatinine of 2.66.  I will repeat this just to ensure stability.    REVIEW OF SYSTEMS:   CHEST/RESPIRATORY:  She is not complaining of shortness of breath.   CARDIAC:   No chest pain.  No palpitations.   GI:  No abdominal pain.    PHYSICAL EXAMINATION:   VITAL SIGNS:   PULSE:  73.   O2 SATURATIONS:  95% on room air.   RESPIRATIONS:  18 and unlabored.   GENERAL APPEARANCE:  The patient looks well.  She is not in any distress.   CHEST/RESPIRATORY:  Clear air entry bilaterally.   CARDIOVASCULAR:  CARDIAC:   Heart sounds are normal.  I do not appreciate any murmurs.  Her JVP is perhaps slightly elevated.  However, she has no real edema that I can discern.  No sacral edema.  She has pressure stockings on, so I did not have the best look at her legs.    ASSESSMENT/PLAN:  Mixed systolic and diastolic heart failure.  This appears to be very stable.  Currently on Demadex 60 mg daily.    Chronic atrial fibrillation.  On Coumadin.  Last checked on 10/15/2013, her INR was 2.26.  She is on a  dose of amiodarone at 100 a day.  She appears to be tolerating this well.    Strep viridans bacteremia.  She completed her Rocephin on 10/19/2013.  This also appears to be stable.    Chronic renal failure; This apppears to be stable. It will be rechecked.   Other than repeating the patient's lab work, everything looks fairly stable here.  I will review things again

## 2013-10-25 ENCOUNTER — Encounter: Payer: Self-pay | Admitting: Internal Medicine

## 2013-10-25 ENCOUNTER — Non-Acute Institutional Stay (SKILLED_NURSING_FACILITY): Payer: Commercial Managed Care - HMO | Admitting: Internal Medicine

## 2013-10-25 DIAGNOSIS — I5032 Chronic diastolic (congestive) heart failure: Secondary | ICD-10-CM

## 2013-10-25 DIAGNOSIS — I4891 Unspecified atrial fibrillation: Secondary | ICD-10-CM

## 2013-10-25 DIAGNOSIS — I5022 Chronic systolic (congestive) heart failure: Secondary | ICD-10-CM

## 2013-10-25 DIAGNOSIS — N189 Chronic kidney disease, unspecified: Secondary | ICD-10-CM

## 2013-10-25 DIAGNOSIS — I509 Heart failure, unspecified: Secondary | ICD-10-CM

## 2013-10-25 NOTE — Progress Notes (Signed)
[name], female   DOB: 18-Jun-1940, 73 y.o.   MRN: [id number]   This is an acute visit.  Level of care skilled.  Facility [facility].  Chief complaint acute visit secondary to weight gain history of CHF.  History of present illness.  H: [name] is a patient whom we had here earlier this year. 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 However, her ejection fraction on this admission had actually improved with an EF of 50-55%. She has moderate AI. Her bioprosthetic valve was satisfactory.  She was admitted after being followed closely in the [facility]. She had increasing fatigue and increasing shortness of breath. Her heart rate was 45. Amiodarone and Coreg were stopped. She was found to have a creatinine of 3.29, a potassium of 5.5. Her pro-BNP was 2,909.  She was admitted. Her diuretics were on hold. She had a central line placed with a CVP of 15, a wedge pressure of 20, pulmonary artery pressures at 47/19, and her cardiac output was 4. She was started on dopamine low-dose and restarted on Lasix IV. Her renal function improved. However, she remained volume-overloaded so she was put on a Lasix drip along with metolazone. At this point, her creatinine increased. However, it ended up stabilizing at 2.4. She saw Palliative Care. However, the patient was not interested in this, wanting an attempt at rehabilitation.  The final issue is that she developed a high fever on admission. Blood cultures were done and were positive for Strep viridans. She was seen by Infectious Disease,treated with Rocephin which ended on August 14th. A transthoracic echo did not show endocarditis.  Her discharge weights were 167 to 172 pounds--during her stay here appears she was in the low 170s-last couple days has trended up weight yesterday 175.3 and today 179--she is on torsemide 60 mg a day with potassium 20 mEq a day-she is not  complaining of any shortness of breath she states even with therapy  Her renal insufficiency appears to be improving most recent lab on August 19 showed a BUN 28 and creatinine of 1.7  Family medical social history reviewed her recent progress note 10/22/2013 and admission note on 10/17/2013  Medications have been reviewed per [facility]   .  REVIEW OF SYSTEMS: Gen. no complaints of fever chills  CHEST/RESPIRATORY: She is not complaining of shortness of breath.  CARDIAC: No chest pain. No palpitations.  GI: No abdominal pain Neuro does not complain of headache or dizziness.  Psych appears pleasant and appropriate is not complaining of depression or anxiety .  PHYSICAL EXAMINATION:   She is afebrile pulse 74 respirations 20 blood pressure 132/59 weight today is 179 scales are to be believe this is a gain of about 7 pounds over the past 2-3 days.  GENERAL APPEARANCE: The patient looks well. She is not in any distress sitting comfortably in her wheelchair.  Her skin is warm and dry.  Oropharynx clear mucous membranes moist.  CHEST/RESPIRATORY: Clear air entry bilaterally there is no labored breathing.  CARDIOVASCULAR:  CARDIAC: Heart sounds are normal. I do not appreciate any murmurs Always say she has 2+ edema bilaterally.  Her abdomen is somewhat obese soft nontender with positive bowel sounds.  Muscle skeletal is able move all extremities x4 ambulates in a wheelchair.  Neurologic appears to be grossly intact no lateralizing findings he does have some history of left-sided weakness this appears again quite minimal.  Psych she  is alert and oriented pleasant and appropriate.  Labs  10/24/2013.  WBC 5.7 hemoglobin 9.0 platelets 284.  Sodium 142 potassium 3.7 BUN 28 creatinine 1.7.  10/15/2013.  Potassium 3.4 sodium 143 BUN 55 creatinine 2.66.  10/22/2013.  Sodium 145 potassium 3.8 BUN 30 creatinine 1.84. .   ASSESSMENT/PLAN:  Mixed systolic and diastolic heart failure..  Currently on Demadex 60 mg daily--secondary to weight gain we will increase this to 80 mg a day and recheck a renal panel on Monday, August 24 as clinically she appears stable but weight gain is concerning --this was discussed with Dr. Dellia Nims via phone  Chronic atrial fibrillation. On Coumadin. INR yesterday was 1.6-Coumadin was increased to 2 mg a day yesterday-update INR is pending for tomorrow. She is on a dose of amiodarone at 100 a day. She appears to be tolerating this well.  Strep viridans bacteremia. She completed her Rocephin on 10/19/2013. This also appears to be stable.  Chronic renal failure; This apppears to be stable. It will be rechecked. On Monday    [id number]

## 2013-11-02 ENCOUNTER — Encounter (HOSPITAL_COMMUNITY): Payer: Commercial Managed Care - HMO

## 2013-11-05 ENCOUNTER — Ambulatory Visit (INDEPENDENT_AMBULATORY_CARE_PROVIDER_SITE_OTHER): Payer: Commercial Managed Care - HMO | Admitting: *Deleted

## 2013-11-05 DIAGNOSIS — I5043 Acute on chronic combined systolic (congestive) and diastolic (congestive) heart failure: Secondary | ICD-10-CM

## 2013-11-05 DIAGNOSIS — Z7901 Long term (current) use of anticoagulants: Secondary | ICD-10-CM

## 2013-11-05 DIAGNOSIS — I4891 Unspecified atrial fibrillation: Secondary | ICD-10-CM

## 2013-11-05 DIAGNOSIS — Z952 Presence of prosthetic heart valve: Secondary | ICD-10-CM

## 2013-11-05 DIAGNOSIS — Z9889 Other specified postprocedural states: Secondary | ICD-10-CM

## 2013-11-05 DIAGNOSIS — Z953 Presence of xenogenic heart valve: Secondary | ICD-10-CM

## 2013-11-05 DIAGNOSIS — I509 Heart failure, unspecified: Secondary | ICD-10-CM

## 2013-11-05 DIAGNOSIS — Z8679 Personal history of other diseases of the circulatory system: Secondary | ICD-10-CM

## 2013-11-05 LAB — POCT INR: INR: 4.4

## 2013-11-05 NOTE — Patient Instructions (Signed)

## 2013-11-06 NOTE — Consult Note (Signed)
I have reviewed and discussed the care of this patient in detail with the nurse practitioner including pertinent patient records, physical exam findings and data. I agree with details of this encounter.  

## 2013-11-11 ENCOUNTER — Inpatient Hospital Stay (HOSPITAL_COMMUNITY)
Admission: EM | Admit: 2013-11-11 | Discharge: 2013-11-21 | DRG: 515 | Disposition: A | Discharge status: Home Health | Payer: Medicare HMO | Attending: Internal Medicine | Admitting: Internal Medicine

## 2013-11-11 ENCOUNTER — Emergency Department (HOSPITAL_COMMUNITY): Payer: Medicare HMO

## 2013-11-11 ENCOUNTER — Encounter (HOSPITAL_COMMUNITY): Payer: Self-pay | Admitting: Emergency Medicine

## 2013-11-11 DIAGNOSIS — Z954 Presence of other heart-valve replacement: Secondary | ICD-10-CM

## 2013-11-11 DIAGNOSIS — R32 Unspecified urinary incontinence: Secondary | ICD-10-CM | POA: Diagnosis present

## 2013-11-11 DIAGNOSIS — N183 Chronic kidney disease, stage 3 unspecified: Secondary | ICD-10-CM

## 2013-11-11 DIAGNOSIS — I10 Essential (primary) hypertension: Secondary | ICD-10-CM

## 2013-11-11 DIAGNOSIS — W010XXA Fall on same level from slipping, tripping and stumbling without subsequent striking against object, initial encounter: Secondary | ICD-10-CM | POA: Diagnosis present

## 2013-11-11 DIAGNOSIS — I5033 Acute on chronic diastolic (congestive) heart failure: Secondary | ICD-10-CM

## 2013-11-11 DIAGNOSIS — N039 Chronic nephritic syndrome with unspecified morphologic changes: Secondary | ICD-10-CM

## 2013-11-11 DIAGNOSIS — J9600 Acute respiratory failure, unspecified whether with hypoxia or hypercapnia: Secondary | ICD-10-CM | POA: Diagnosis present

## 2013-11-11 DIAGNOSIS — Z5189 Encounter for other specified aftercare: Secondary | ICD-10-CM

## 2013-11-11 DIAGNOSIS — Z66 Do not resuscitate: Secondary | ICD-10-CM

## 2013-11-11 DIAGNOSIS — I5023 Acute on chronic systolic (congestive) heart failure: Secondary | ICD-10-CM

## 2013-11-11 DIAGNOSIS — N390 Urinary tract infection, site not specified: Secondary | ICD-10-CM | POA: Diagnosis present

## 2013-11-11 DIAGNOSIS — I359 Nonrheumatic aortic valve disorder, unspecified: Secondary | ICD-10-CM | POA: Diagnosis present

## 2013-11-11 DIAGNOSIS — I2789 Other specified pulmonary heart diseases: Secondary | ICD-10-CM | POA: Diagnosis present

## 2013-11-11 DIAGNOSIS — Z952 Presence of prosthetic heart valve: Secondary | ICD-10-CM

## 2013-11-11 DIAGNOSIS — J449 Chronic obstructive pulmonary disease, unspecified: Secondary | ICD-10-CM | POA: Diagnosis present

## 2013-11-11 DIAGNOSIS — N189 Chronic kidney disease, unspecified: Secondary | ICD-10-CM

## 2013-11-11 DIAGNOSIS — E669 Obesity, unspecified: Secondary | ICD-10-CM | POA: Diagnosis present

## 2013-11-11 DIAGNOSIS — Z981 Arthrodesis status: Secondary | ICD-10-CM

## 2013-11-11 DIAGNOSIS — Y92009 Unspecified place in unspecified non-institutional (private) residence as the place of occurrence of the external cause: Secondary | ICD-10-CM

## 2013-11-11 DIAGNOSIS — I5032 Chronic diastolic (congestive) heart failure: Secondary | ICD-10-CM

## 2013-11-11 DIAGNOSIS — I130 Hypertensive heart and chronic kidney disease with heart failure and stage 1 through stage 4 chronic kidney disease, or unspecified chronic kidney disease: Secondary | ICD-10-CM | POA: Diagnosis present

## 2013-11-11 DIAGNOSIS — W19XXXA Unspecified fall, initial encounter: Secondary | ICD-10-CM

## 2013-11-11 DIAGNOSIS — N179 Acute kidney failure, unspecified: Secondary | ICD-10-CM

## 2013-11-11 DIAGNOSIS — J4489 Other specified chronic obstructive pulmonary disease: Secondary | ICD-10-CM | POA: Diagnosis present

## 2013-11-11 DIAGNOSIS — Z96659 Presence of unspecified artificial knee joint: Secondary | ICD-10-CM

## 2013-11-11 DIAGNOSIS — I5043 Acute on chronic combined systolic (congestive) and diastolic (congestive) heart failure: Secondary | ICD-10-CM | POA: Diagnosis present

## 2013-11-11 DIAGNOSIS — Z7901 Long term (current) use of anticoagulants: Secondary | ICD-10-CM

## 2013-11-11 DIAGNOSIS — I5042 Chronic combined systolic (congestive) and diastolic (congestive) heart failure: Secondary | ICD-10-CM | POA: Diagnosis present

## 2013-11-11 DIAGNOSIS — N184 Chronic kidney disease, stage 4 (severe): Secondary | ICD-10-CM | POA: Diagnosis present

## 2013-11-11 DIAGNOSIS — S32009A Unspecified fracture of unspecified lumbar vertebra, initial encounter for closed fracture: Principal | ICD-10-CM

## 2013-11-11 DIAGNOSIS — E785 Hyperlipidemia, unspecified: Secondary | ICD-10-CM | POA: Diagnosis present

## 2013-11-11 DIAGNOSIS — W050XXA Fall from non-moving wheelchair, initial encounter: Secondary | ICD-10-CM | POA: Diagnosis present

## 2013-11-11 DIAGNOSIS — I480 Paroxysmal atrial fibrillation: Secondary | ICD-10-CM

## 2013-11-11 DIAGNOSIS — Z953 Presence of xenogenic heart valve: Secondary | ICD-10-CM

## 2013-11-11 DIAGNOSIS — G9340 Encephalopathy, unspecified: Secondary | ICD-10-CM

## 2013-11-11 DIAGNOSIS — N3000 Acute cystitis without hematuria: Secondary | ICD-10-CM

## 2013-11-11 DIAGNOSIS — I509 Heart failure, unspecified: Secondary | ICD-10-CM

## 2013-11-11 DIAGNOSIS — I4891 Unspecified atrial fibrillation: Secondary | ICD-10-CM | POA: Diagnosis present

## 2013-11-11 DIAGNOSIS — I00 Rheumatic fever without heart involvement: Secondary | ICD-10-CM | POA: Diagnosis present

## 2013-11-11 DIAGNOSIS — Z8673 Personal history of transient ischemic attack (TIA), and cerebral infarction without residual deficits: Secondary | ICD-10-CM

## 2013-11-11 DIAGNOSIS — M4850XA Collapsed vertebra, not elsewhere classified, site unspecified, initial encounter for fracture: Secondary | ICD-10-CM

## 2013-11-11 DIAGNOSIS — J9601 Acute respiratory failure with hypoxia: Secondary | ICD-10-CM

## 2013-11-11 DIAGNOSIS — M109 Gout, unspecified: Secondary | ICD-10-CM | POA: Diagnosis present

## 2013-11-11 DIAGNOSIS — IMO0001 Reserved for inherently not codable concepts without codable children: Secondary | ICD-10-CM

## 2013-11-11 DIAGNOSIS — I4821 Permanent atrial fibrillation: Secondary | ICD-10-CM | POA: Diagnosis present

## 2013-11-11 DIAGNOSIS — I059 Rheumatic mitral valve disease, unspecified: Secondary | ICD-10-CM | POA: Diagnosis present

## 2013-11-11 DIAGNOSIS — M129 Arthropathy, unspecified: Secondary | ICD-10-CM | POA: Diagnosis present

## 2013-11-11 DIAGNOSIS — I5022 Chronic systolic (congestive) heart failure: Secondary | ICD-10-CM

## 2013-11-11 DIAGNOSIS — D631 Anemia in chronic kidney disease: Secondary | ICD-10-CM

## 2013-11-11 DIAGNOSIS — Z9889 Other specified postprocedural states: Secondary | ICD-10-CM

## 2013-11-11 DIAGNOSIS — W19XXXD Unspecified fall, subsequent encounter: Secondary | ICD-10-CM

## 2013-11-11 DIAGNOSIS — IMO0002 Reserved for concepts with insufficient information to code with codable children: Secondary | ICD-10-CM

## 2013-11-11 DIAGNOSIS — Z8679 Personal history of other diseases of the circulatory system: Secondary | ICD-10-CM

## 2013-11-11 HISTORY — DX: Chronic obstructive pulmonary disease, unspecified: J44.9

## 2013-11-11 HISTORY — DX: Personal history of transient ischemic attack (TIA), and cerebral infarction without residual deficits: Z86.73

## 2013-11-11 HISTORY — DX: Chronic kidney disease, stage 4 (severe): N18.4

## 2013-11-11 HISTORY — DX: Essential (primary) hypertension: I10

## 2013-11-11 LAB — BASIC METABOLIC PANEL
ANION GAP: 11 (ref 5–15)
BUN: 21 mg/dL (ref 6–23)
CHLORIDE: 100 meq/L (ref 96–112)
CO2: 30 meq/L (ref 19–32)
Calcium: 9.1 mg/dL (ref 8.4–10.5)
Creatinine, Ser: 1.97 mg/dL — ABNORMAL HIGH (ref 0.50–1.10)
GFR calc Af Amer: 28 mL/min — ABNORMAL LOW (ref >90)
GFR calc non Af Amer: 24 mL/min — ABNORMAL LOW (ref >90)
Glucose, Bld: 124 mg/dL — ABNORMAL HIGH (ref 70–99)
POTASSIUM: 3.6 meq/L — AB (ref 3.7–5.3)
Sodium: 141 mEq/L (ref 137–147)

## 2013-11-11 LAB — CBC WITH DIFFERENTIAL/PLATELET
BASOS PCT: 0 % (ref 0–1)
Basophils Absolute: 0 10*3/uL (ref 0.0–0.1)
Eosinophils Absolute: 0.3 10*3/uL (ref 0.0–0.7)
Eosinophils Relative: 6 % — ABNORMAL HIGH (ref 0–5)
HEMATOCRIT: 27.7 % — AB (ref 36.0–46.0)
Hemoglobin: 8.7 g/dL — ABNORMAL LOW (ref 12.0–15.0)
LYMPHS PCT: 23 % (ref 12–46)
Lymphs Abs: 1.3 10*3/uL (ref 0.7–4.0)
MCH: 28.2 pg (ref 26.0–34.0)
MCHC: 31.4 g/dL (ref 30.0–36.0)
MCV: 89.9 fL (ref 78.0–100.0)
Monocytes Absolute: 0.6 10*3/uL (ref 0.1–1.0)
Monocytes Relative: 11 % (ref 3–12)
NEUTROS ABS: 3.2 10*3/uL (ref 1.7–7.7)
NEUTROS PCT: 60 % (ref 43–77)
Platelets: 244 10*3/uL (ref 150–400)
RBC: 3.08 MIL/uL — ABNORMAL LOW (ref 3.87–5.11)
RDW: 18 % — ABNORMAL HIGH (ref 11.5–15.5)
WBC: 5.4 10*3/uL (ref 4.0–10.5)

## 2013-11-11 LAB — PROTIME-INR
INR: 2.86 — AB (ref 0.00–1.49)
PROTHROMBIN TIME: 30 s — AB (ref 11.6–15.2)

## 2013-11-11 MED ORDER — HYDROCODONE-ACETAMINOPHEN 5-325 MG PO TABS: 1.0000 | ORAL_TABLET | ORAL | Status: DC | PRN

## 2013-11-11 MED ORDER — HYDROMORPHONE HCL PF 1 MG/ML IJ SOLN: 1.0000 mg | INTRAMUSCULAR | Status: DC | PRN

## 2013-11-11 MED ORDER — OXYBUTYNIN CHLORIDE ER 5 MG PO TB24
5.0000 mg | ORAL_TABLET | Freq: Every day | ORAL | Status: DC
  Administered 2013-11-12 – 2013-11-20 (×10): 5 mg via ORAL
  Filled 2013-11-11 (×11): qty 1

## 2013-11-11 MED ORDER — HYDROMORPHONE HCL PF 1 MG/ML IJ SOLN
0.5000 mg | Freq: Once | INTRAMUSCULAR | Status: AC
  Administered 2013-11-11: 0.5 mg via INTRAVENOUS
  Filled 2013-11-11: qty 1

## 2013-11-11 MED ORDER — COLCHICINE 0.6 MG PO TABS
0.3000 mg | ORAL_TABLET | Freq: Every day | ORAL | Status: DC
  Administered 2013-11-12 – 2013-11-21 (×10): 0.3 mg via ORAL
  Filled 2013-11-11 (×6): qty 0.5
  Filled 2013-11-11 (×3): qty 1
  Filled 2013-11-11: qty 0.5

## 2013-11-11 MED ORDER — HYDROMORPHONE HCL PF 1 MG/ML IJ SOLN
INTRAMUSCULAR | Status: AC
  Administered 2013-11-11: 1 mg via INTRAVENOUS
  Filled 2013-11-11: qty 1

## 2013-11-11 MED ORDER — TORSEMIDE 20 MG PO TABS
80.0000 mg | ORAL_TABLET | Freq: Every morning | ORAL | Status: DC
  Administered 2013-11-12 – 2013-11-14 (×3): 80 mg via ORAL
  Filled 2013-11-11 (×4): qty 4

## 2013-11-11 MED ORDER — SIMVASTATIN 10 MG PO TABS: 10.0000 mg | ORAL_TABLET | Freq: Every day | ORAL | Status: DC

## 2013-11-11 MED ORDER — ONDANSETRON HCL 4 MG/2ML IJ SOLN
4.0000 mg | Freq: Four times a day (QID) | INTRAMUSCULAR | Status: DC | PRN
  Administered 2013-11-12 – 2013-11-16 (×3): 4 mg via INTRAVENOUS
  Filled 2013-11-11 (×6): qty 2

## 2013-11-11 MED ORDER — ONDANSETRON HCL 4 MG PO TABS: 4.0000 mg | ORAL_TABLET | Freq: Four times a day (QID) | ORAL | Status: DC | PRN

## 2013-11-11 MED ORDER — MORPHINE SULFATE 2 MG/ML IJ SOLN
2.0000 mg | INTRAMUSCULAR | Status: DC | PRN
  Administered 2013-11-12 – 2013-11-13 (×7): 2 mg via INTRAVENOUS
  Filled 2013-11-11 (×8): qty 1

## 2013-11-11 MED ORDER — SODIUM CHLORIDE 0.9 % IJ SOLN: 3.0000 mL | INTRAMUSCULAR | Status: DC | PRN

## 2013-11-11 MED ORDER — WARFARIN SODIUM 2.5 MG PO TABS: 1.2500 mg | ORAL_TABLET | ORAL | Status: DC

## 2013-11-11 MED ORDER — SODIUM CHLORIDE 0.9 % IJ SOLN
3.0000 mL | Freq: Two times a day (BID) | INTRAMUSCULAR | Status: DC
  Administered 2013-11-12 – 2013-11-21 (×15): 3 mL via INTRAVENOUS

## 2013-11-11 MED ORDER — POTASSIUM CHLORIDE CRYS ER 20 MEQ PO TBCR
20.0000 meq | EXTENDED_RELEASE_TABLET | Freq: Every day | ORAL | Status: DC
  Administered 2013-11-12 – 2013-11-14 (×3): 20 meq via ORAL
  Filled 2013-11-11 (×3): qty 1

## 2013-11-11 MED ORDER — AMIODARONE HCL 100 MG PO TABS
100.0000 mg | ORAL_TABLET | Freq: Every day | ORAL | Status: DC
  Administered 2013-11-12 – 2013-11-21 (×10): 100 mg via ORAL
  Filled 2013-11-11 (×10): qty 1

## 2013-11-11 MED ORDER — WARFARIN - PHYSICIAN DOSING INPATIENT: Freq: Every day | Status: DC

## 2013-11-11 MED ORDER — HYDROMORPHONE HCL PF 1 MG/ML IJ SOLN
1.0000 mg | Freq: Once | INTRAMUSCULAR | Status: AC
  Administered 2013-11-11: 1 mg via INTRAVENOUS

## 2013-11-11 MED ORDER — HYDROMORPHONE HCL PF 1 MG/ML IJ SOLN
0.5000 mg | INTRAMUSCULAR | Status: DC | PRN
Start: 2013-11-11 — End: 2013-11-11

## 2013-11-11 MED ORDER — SODIUM CHLORIDE 0.9 % IV SOLN: 250.0000 mL | INTRAVENOUS | Status: DC | PRN

## 2013-11-11 NOTE — H&P (Signed)
PCP:   [name], MD   Chief Complaint:  Back pain  HPI: 73 yo female was being wheeled in her wheelchair today by her 19 yo granddtr when they slipped off the sidewalk edge and she fell out of her chair and hurt her back.  No LOC.  Has been in/out hospital most of this year for her chf issues.  Was just getting out of rehab 2 weeks ago and was back at home living alone.  Was recovering well.  Family very involved and checks on her frequently.  Review of Systems:  Positive and negative as per HPI otherwise all other systems are negative  Past Medical History: Past Medical History  Diagnosis Date  . Chronic airway obstruction, not elsewhere classified   . Precordial pain   . Swelling of limb   . Dizziness and giddiness   . Unspecified transient cerebral ischemia     Diag. 2003  . Acute, but ill-defined, cerebrovascular disease     2000 [place]  . Shortness of breath   . Obesity   . Juvenile rheumatic fever     age 41  . Unspecified essential hypertension   . Hyperlipidemia   . Mitral regurgitation 02/16/2013  . Chronic kidney disease   . Paroxysmal atrial fibrillation 02/16/2013    Recurrent paroxysmal atrial fibrillation, first diagnosed December 2014  . Chronic diastolic congestive heart failure   . Arthritis   . Aortic insufficiency due to bicuspid aortic valve     moderate by TEE  . 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 S/P Maze operation for atrial fibrillation 05/16/2013    Complete bilateral atrial lesion set using cryothermy and bipolar radiofrequency ablation with oversewing of LA appendage  . Chronic combined systolic and diastolic CHF (congestive heart failure)      a. ECHO (3/15): EF 40-45%, mildly dil RV with mildly dec sys fx, bioprosthetic MV .b. Echo (7/15) with EF 50-55%, mod AI, bioprosthetic MV ok.      Past Surgical History  Procedure Laterality Date  . Total knee arthroplasty Right   . Abdominal hysterectomy      Cervical Cancer  . Parathyroid/thyroid surgery      tumor  . Tee without cardioversion N/A 04/06/2013    Procedure: TRANSESOPHAGEAL ECHOCARDIOGRAM (TEE);  Surgeon: [name], MD;  Location: [facility];  Service: Cardiology;  Laterality: N/A;  . Minimally invasive maze procedure N/A 05/16/2013    Procedure: MINIMALLY INVASIVE MAZE PROCEDURE;  Surgeon: Rexene Alberts, MD;  Location: [place];  Service: Open Heart Surgery;  Laterality: N/A;  . Intraoperative transesophageal echocardiogram N/A 05/16/2013    Procedure: INTRAOPERATIVE TRANSESOPHAGEAL ECHOCARDIOGRAM;  Surgeon: Rexene Alberts, MD;  Location: [place];  Service: Open Heart Surgery;  Laterality: N/A;  . Mitral valve replacement Right 05/16/2013    Procedure: MINIMALLY INVASIVE MITRAL VALVE (MV) REPLACEMENT;  Surgeon: Rexene Alberts, MD;  Location: [place];  Service: Open Heart Surgery;  Laterality: Right;  . Tee without cardioversion N/A 06/06/2013    Procedure: TRANSESOPHAGEAL ECHOCARDIOGRAM (TEE);  Surgeon: Larey Dresser, MD;  Location: [place] [facility];  Service: Cardiovascular;  Laterality: N/A;  . Cardioversion N/A 06/06/2013    Procedure: CARDIOVERSION;  Surgeon: Larey Dresser, MD;  Location: [place];  Service: Cardiovascular;  Laterality: N/A;    Medications: Prior to Admission medications   Medication Sig  Start Date End Date Taking? Authorizing Provider  amiodarone (PACERONE) 100 MG tablet Take 1 tablet (100 mg total) by mouth daily. 10/11/13  Yes Rande Brunt, NP  colchicine 0.6 MG tablet Take 0.5 tablets (0.3 mg total) by mouth daily. 10/11/13  Yes Rande Brunt, NP  oxybutynin (DITROPAN-XL) 5 MG 24 hr tablet Take 5 mg by mouth at bedtime.   Yes Historical Provider, MD  potassium chloride SA (K-DUR,KLOR-CON) 20 MEQ tablet Take 1 tablet (20 mEq total) by mouth daily. 05/30/13  Yes Donielle Liston Alba,  PA-C  simvastatin (ZOCOR) 10 MG tablet Take 1 tablet (10 mg total) by mouth daily at 6 PM. 06/13/13  Yes Rhonda G Barrett, PA-C  torsemide (DEMADEX) 20 MG tablet Take 80 mg by mouth every morning.   Yes Historical Provider, MD  warfarin (COUMADIN) 2.5 MG tablet Take 1.25-2.5 mg by mouth See admin instructions. Takes one tablet (2.5mg  total) on all days except on Friday. Takes one-half tablet (1.25mg  total) on Fridays only   Yes Historical Provider, MD    Allergies:   Allergies  Allergen Reactions  . Indomethacin Other (See Comments)    dizziness  . Norvasc [Amlodipine Besylate] Cough    Social History:  reports that she has never smoked. She has never used smokeless tobacco. She reports that she does not drink alcohol or use illicit drugs.  Family History: Family History  Problem Relation Age of Onset  . Heart failure Father   . Heart attack Brother     Physical Exam: Filed Vitals:   11/11/13 2009 11/11/13 2030 11/11/13 2130 11/11/13 2200  BP:  116/43 120/47 115/48  Pulse: 62 57    Temp:      TempSrc:      Resp:      SpO2: 99% 100%     Head: Normocephalic, without obvious abnormality, atraumatic Eyes: negative Nose: Nares normal. Septum midline. Mucosa normal. No drainage or sinus tenderness. Neck: no JVD and supple, symmetrical, trachea midline Lungs: clear to auscultation bilaterally Heart: regular rate and rhythm, S1, S2 normal, no murmur, click, rub or gallop Abdomen: soft, non-tender; bowel sounds normal; no masses,  no organomegaly Extremities: extremities normal, atraumatic, no cyanosis or edema Pulses: 2+ and symmetric Skin: Skin color, texture, turgor normal. No rashes or lesions Neurologic: Grossly normal    Labs on Admission:   Recent Labs  11/11/13 1831  NA 141  K 3.6*  CL 100  CO2 30  GLUCOSE 124*  BUN 21  CREATININE 1.97*  CALCIUM 9.1    Recent Labs  11/11/13 1831  WBC 5.4  NEUTROABS 3.2  HGB 8.7*  HCT 27.7*  MCV 89.9  PLT 244     Radiological Exams on Admission: Dg Thoracic Spine W/swimmers  11/11/2013   CLINICAL DATA:  Fall.  Pain.  EXAM: THORACIC SPINE - 2 VIEW + SWIMMERS  COMPARISON:  Chest radiographs 10/09/2013 and 10/08/2013. Chest CT 05/07/2013.  FINDINGS: There are 12 pairs of ribs. Upper thoracic dextroscoliosis is noted. No listhesis is seen. Vertebral body fusion is again seen at T12-L1 with the vertebral bodies appearing hypoplastic, likely congenital. There is new curvilinear lucency extending through the anterior cortex at L1 and to the inferior endplate, consistent with new fracture. There is also a fracture through an anterior osteophyte at T7-8 with questionable extension through the T8 vertebral body, and this also appears new. Diffuse bridging osteophytosis is again seen throughout the visualized spine and may reflect DISH.  Cardiac silhouette is enlarged with evidence  of prior mitral valve replacement. Surgical clips are noted in the mediastinum and lower left neck. Right PICC line has been removed. Right jugular sheath has also been removed. Ill defined right lower lobe infiltrate appears mildly improved.  IMPRESSION: 1. New L1 vertebral body fracture. 2. Fracture through an anterior osteophyte at T7-8 with questionable extension into the T8 vertebral body. Thoracic spine CT is recommended to further evaluate these findings. These results were called by telephone at the time of interpretation on 11/11/2013 at 8:18 pm to Dr. Riki Altes , who verbally acknowledged these results.   Electronically Signed   By: Logan Bores   On: 11/11/2013 20:19   Dg Lumbar Spine Complete  11/11/2013   CLINICAL DATA:  Fall.  Low back pain.  EXAM: LUMBAR SPINE - COMPLETE 4+ VIEW  COMPARISON:  08/12/2004  FINDINGS: No evidence of acute lumbar spine fracture or subluxation. 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 Mild degenerative disc disease is seen at all lumbar levels, without significant change. Lower  lumbar facet DJD again seen bilaterally from levels of L3-S1. No focal lytic sclerotic bone lesions identified.  IMPRESSION: No acute findings.  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  Degenerative spondylosis, as described above.   Electronically Signed   By: Earle Gell M.D.   On: 11/11/2013 20:03   Ct Head Wo Contrast  11/11/2013   CLINICAL DATA:  Fall, posterior scalp hematoma, on Coumadin  EXAM: CT HEAD WITHOUT CONTRAST  CT CERVICAL SPINE WITHOUT CONTRAST  TECHNIQUE: Multidetector CT imaging of the head and cervical spine was performed following the standard protocol without intravenous contrast. Multiplanar CT image reconstructions of the cervical spine were also generated.  COMPARISON:  CT head dated 05/22/2013  FINDINGS: CT HEAD FINDINGS  No evidence of parenchymal hemorrhage or extra-axial fluid collection. No mass lesion, mass effect, or midline shift.  No CT evidence of acute infarction.  Subcortical white matter and periventricular small vessel ischemic changes. Intracranial atherosclerosis.  Bilateral basal ganglia lacunar infarcts.  Mild global cortical atrophy.  No ventriculomegaly.  The visualized paranasal sinuses are essentially clear. The mastoid air cells are unopacified.  No evidence of calvarial fracture.  CT CERVICAL SPINE FINDINGS  Exaggerated cervical lordosis.  No evidence of fracture or dislocation. Vertebral body heights are maintained. Dens appears intact.  Moderate multilevel degenerative changes.  Status post left thyroidectomy.  Right thyroid is mildly nodular.  Visualized lung apices are essentially clear.  IMPRESSION: No evidence of acute intracranial abnormality. Atrophy with small vessel ischemic changes and intracranial atherosclerosis.  No evidence of traumatic injury to the cervical spine. Moderate multilevel degenerative changes.   Electronically Signed   By: Julian Hy M.D.   On: 11/11/2013 19:37   Ct Cervical Spine Wo Contrast  11/11/2013    CLINICAL DATA:  Fall, posterior scalp hematoma, on Coumadin  EXAM: CT HEAD WITHOUT CONTRAST  CT CERVICAL SPINE WITHOUT CONTRAST  TECHNIQUE: Multidetector CT imaging of the head and cervical spine was performed following the standard protocol without intravenous contrast. Multiplanar CT image reconstructions of the cervical spine were also generated.  COMPARISON:  CT head dated 05/22/2013  FINDINGS: CT HEAD FINDINGS  No evidence of parenchymal hemorrhage or extra-axial fluid collection. No mass lesion, mass effect, or midline shift.  No CT evidence of acute infarction.  Subcortical white matter and periventricular small vessel ischemic changes. Intracranial atherosclerosis.  Bilateral basal ganglia lacunar infarcts.  Mild global cortical atrophy.  No ventriculomegaly.  The visualized paranasal  sinuses are essentially clear. The mastoid air cells are unopacified.  No evidence of calvarial fracture.  CT CERVICAL SPINE FINDINGS  Exaggerated cervical lordosis.  No evidence of fracture or dislocation. Vertebral body heights are maintained. Dens appears intact.  Moderate multilevel degenerative changes.  Status post left thyroidectomy.  Right thyroid is mildly nodular.  Visualized lung apices are essentially clear.  IMPRESSION: No evidence of acute intracranial abnormality. Atrophy with small vessel ischemic changes and intracranial atherosclerosis.  No evidence of traumatic injury to the cervical spine. Moderate multilevel degenerative changes.   Electronically Signed   By: Julian Hy M.D.   On: 11/11/2013 19:37   Ct Thoracic Spine Wo Contrast  11/11/2013   CLINICAL DATA:  Fall.  EXAM: CT THORACIC AND LUMBAR SPINE WITHOUT CONTRAST  TECHNIQUE: Multidetector CT imaging of the thoracic and lumbar spine was performed without contrast. Multiplanar CT image reconstructions were also generated.  COMPARISON:  05/07/2013 and 02/11/2011  FINDINGS: CT THORACIC SPINE FINDINGS  There is mild biphasic curvature of the  thoracolumbar spine with primary curvature of the thoracic spine convex to the right and secondary curvature of the lumbar spine convex to the left unchanged. There is moderate spondylosis throughout the spine. There are bridging anterior osteophytes/ ossification of the anterior longitudinal ligament. There is mild compression deformity of T12 without significant change. There is decreased disc space at multiple levels of the thoracic spine unchanged.  There is 9 mm hypodensity over the right lobe of the thyroid. There are surgical clips adjacent the left thyroid which likely has been removed. There is calcified plaque over the thoracic aorta. There is evidence cardiomegaly with prosthetic mitral valve. There is patchy opacification over the mid to lower lungs as cannot exclude infection.6 2 cm right middle lobe rounded mass unchanged. 1.2 cm nodular density more anteriorly in the right middle lobe slightly more prominent. Atrophic left kidney unchanged. Possible mild cholelithiasis.  CT LUMBAR SPINE FINDINGS  Moderate spondylosis with disc space narrowing at all levels with mild sparing of the L5-S1 level. Previously noted anterior wedging of L1 is again seen. Previously, there was evidence of partial fusion of T12 and L1 both demonstrating anterior wedging as there is now lucency through the anterior aspect of the previous vertebral body fusion with this lucency extending through the L1 vertebral body compatible with an acute fracture. No evidence of fracture fragments within the canal. No evidence of subluxation. Remainder of the exam is unchanged. Diverticulosis of the colon unchanged.  IMPRESSION: CT THORACIC SPINE IMPRESSION  Moderate spondylosis with multilevel disc disease and bridging anterior osteophyte suggesting DISH. Mild curvature convex to the right.  9 mm right thyroid nodule.  Previous left thyroidectomy.  Cardiomegaly.  Stable 2 cm right middle lobe nodule. 1.2 cm anterior right middle lobe nodule  new or larger. Mild heterogeneous opacification within the mid to lower lungs as cannot exclude infection. Consider chest CT for further evaluation.  CT LUMBAR SPINE IMPRESSION  Acute fracture through the anterior aspect of the previously seen fusion of the T12 and L1 vertebral bodies at the level of the anterior disc space as this fracture extends obliquely through the L1 vertebral body. No fracture fragments within the canal. No subluxation.  Moderate spondylosis with multilevel disc disease.  Diverticulosis of the colon.  These results were called by telephone at the time of interpretation on 11/11/2013 at 9:33 pm to Dr. Riki Altes , who verbally acknowledged these results.   Electronically Signed   By: Marin Olp  M.D.   On: 11/11/2013 21:33   Ct Lumbar Spine Wo Contrast  11/11/2013   CLINICAL DATA:  Fall.  EXAM: CT THORACIC AND LUMBAR SPINE WITHOUT CONTRAST  TECHNIQUE: Multidetector CT imaging of the thoracic and lumbar spine was performed without contrast. Multiplanar CT image reconstructions were also generated.  COMPARISON:  05/07/2013 and 02/11/2011  FINDINGS: CT THORACIC SPINE FINDINGS  There is mild biphasic curvature of the thoracolumbar spine with primary curvature of the thoracic spine convex to the right and secondary curvature of the lumbar spine convex to the left unchanged. There is moderate spondylosis throughout the spine. There are bridging anterior osteophytes/ ossification of the anterior longitudinal ligament. There is mild compression deformity of T12 without significant change. There is decreased disc space at multiple levels of the thoracic spine unchanged.  There is 9 mm hypodensity over the right lobe of the thyroid. There are surgical clips adjacent the left thyroid which likely has been removed. There is calcified plaque over the thoracic aorta. There is evidence cardiomegaly with prosthetic mitral valve. There is patchy opacification over the mid to lower lungs as cannot exclude  infection.6 2 cm right middle lobe rounded mass unchanged. 1.2 cm nodular density more anteriorly in the right middle lobe slightly more prominent. Atrophic left kidney unchanged. Possible mild cholelithiasis.  CT LUMBAR SPINE FINDINGS  Moderate spondylosis with disc space narrowing at all levels with mild sparing of the L5-S1 level. Previously noted anterior wedging of L1 is again seen. Previously, there was evidence of partial fusion of T12 and L1 both demonstrating anterior wedging as there is now lucency through the anterior aspect of the previous vertebral body fusion with this lucency extending through the L1 vertebral body compatible with an acute fracture. No evidence of fracture fragments within the canal. No evidence of subluxation. Remainder of the exam is unchanged. Diverticulosis of the colon unchanged.  IMPRESSION: CT THORACIC SPINE IMPRESSION  Moderate spondylosis with multilevel disc disease and bridging anterior osteophyte suggesting DISH. Mild curvature convex to the right.  9 mm right thyroid nodule.  Previous left thyroidectomy.  Cardiomegaly.  Stable 2 cm right middle lobe nodule. 1.2 cm anterior right middle lobe nodule new or larger. Mild heterogeneous opacification within the mid to lower lungs as cannot exclude infection. Consider chest CT for further evaluation.  CT LUMBAR SPINE IMPRESSION  Acute fracture through the anterior aspect of the previously seen fusion of the T12 and L1 vertebral bodies at the level of the anterior disc space as this fracture extends obliquely through the L1 vertebral body. No fracture fragments within the canal. No subluxation.  Moderate spondylosis with multilevel disc disease.  Diverticulosis of the colon.  These results were called by telephone at the time of interpretation on 11/11/2013 at 9:33 pm to Dr. Riki Altes , who verbally acknowledged these results.   Electronically Signed   By: Marin Olp M.D.   On: 11/11/2013 21:33    Assessment/Plan  73 yo  female with mechanical fall from wheelchair with acute L1 fracture  Principal Problem:   Lumbar vertebral fracture-  obs for pain control.  Needs back brace per neurosurgery recommendations(called by edp).  Likely will need short term rehab again.  Active Problems:  Stable unless o/w noted   Juvenile rheumatic fever   Chronic kidney disease   Paroxysmal atrial fibrillation   S/P MVR (mitral valve replacement)   Chronic systolic heart failure  Stable , minimize ivf at this time.   Long term (current) use of  anticoagulants   Anemia in chronic kidney disease(285.21)   DNR (do not resuscitate)   Fall at home  obs to med surg.  DNR.    [name] [name] A 11/11/2013, 10:40 PM
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
## 2013-11-12 DIAGNOSIS — I2789 Other specified pulmonary heart diseases: Secondary | ICD-10-CM | POA: Diagnosis present

## 2013-11-12 DIAGNOSIS — Z981 Arthrodesis status: Secondary | ICD-10-CM | POA: Diagnosis not present

## 2013-11-12 DIAGNOSIS — I4891 Unspecified atrial fibrillation: Secondary | ICD-10-CM | POA: Diagnosis not present

## 2013-11-12 DIAGNOSIS — S32009A Unspecified fracture of unspecified lumbar vertebra, initial encounter for closed fracture: Secondary | ICD-10-CM | POA: Diagnosis present

## 2013-11-12 DIAGNOSIS — N183 Chronic kidney disease, stage 3 unspecified: Secondary | ICD-10-CM | POA: Diagnosis not present

## 2013-11-12 DIAGNOSIS — M109 Gout, unspecified: Secondary | ICD-10-CM | POA: Diagnosis present

## 2013-11-12 DIAGNOSIS — N039 Chronic nephritic syndrome with unspecified morphologic changes: Secondary | ICD-10-CM | POA: Diagnosis present

## 2013-11-12 DIAGNOSIS — G9340 Encephalopathy, unspecified: Secondary | ICD-10-CM | POA: Diagnosis present

## 2013-11-12 DIAGNOSIS — Z8673 Personal history of transient ischemic attack (TIA), and cerebral infarction without residual deficits: Secondary | ICD-10-CM | POA: Diagnosis not present

## 2013-11-12 DIAGNOSIS — N184 Chronic kidney disease, stage 4 (severe): Secondary | ICD-10-CM | POA: Diagnosis present

## 2013-11-12 DIAGNOSIS — IMO0002 Reserved for concepts with insufficient information to code with codable children: Secondary | ICD-10-CM | POA: Diagnosis not present

## 2013-11-12 DIAGNOSIS — W010XXA Fall on same level from slipping, tripping and stumbling without subsequent striking against object, initial encounter: Secondary | ICD-10-CM | POA: Diagnosis present

## 2013-11-12 DIAGNOSIS — E785 Hyperlipidemia, unspecified: Secondary | ICD-10-CM | POA: Diagnosis present

## 2013-11-12 DIAGNOSIS — R32 Unspecified urinary incontinence: Secondary | ICD-10-CM | POA: Diagnosis present

## 2013-11-12 DIAGNOSIS — Z96659 Presence of unspecified artificial knee joint: Secondary | ICD-10-CM | POA: Diagnosis not present

## 2013-11-12 DIAGNOSIS — Y92009 Unspecified place in unspecified non-institutional (private) residence as the place of occurrence of the external cause: Secondary | ICD-10-CM | POA: Diagnosis present

## 2013-11-12 DIAGNOSIS — J449 Chronic obstructive pulmonary disease, unspecified: Secondary | ICD-10-CM | POA: Diagnosis present

## 2013-11-12 DIAGNOSIS — E669 Obesity, unspecified: Secondary | ICD-10-CM | POA: Diagnosis present

## 2013-11-12 DIAGNOSIS — Z954 Presence of other heart-valve replacement: Secondary | ICD-10-CM | POA: Diagnosis not present

## 2013-11-12 DIAGNOSIS — J9600 Acute respiratory failure, unspecified whether with hypoxia or hypercapnia: Secondary | ICD-10-CM | POA: Diagnosis present

## 2013-11-12 DIAGNOSIS — W050XXA Fall from non-moving wheelchair, initial encounter: Secondary | ICD-10-CM | POA: Diagnosis present

## 2013-11-12 DIAGNOSIS — D631 Anemia in chronic kidney disease: Secondary | ICD-10-CM | POA: Diagnosis present

## 2013-11-12 DIAGNOSIS — I5043 Acute on chronic combined systolic (congestive) and diastolic (congestive) heart failure: Secondary | ICD-10-CM | POA: Diagnosis not present

## 2013-11-12 DIAGNOSIS — I509 Heart failure, unspecified: Secondary | ICD-10-CM | POA: Diagnosis not present

## 2013-11-12 DIAGNOSIS — N189 Chronic kidney disease, unspecified: Secondary | ICD-10-CM | POA: Diagnosis not present

## 2013-11-12 DIAGNOSIS — Z7901 Long term (current) use of anticoagulants: Secondary | ICD-10-CM | POA: Diagnosis not present

## 2013-11-12 DIAGNOSIS — M129 Arthropathy, unspecified: Secondary | ICD-10-CM | POA: Diagnosis present

## 2013-11-12 DIAGNOSIS — I5033 Acute on chronic diastolic (congestive) heart failure: Secondary | ICD-10-CM | POA: Diagnosis not present

## 2013-11-12 DIAGNOSIS — N390 Urinary tract infection, site not specified: Secondary | ICD-10-CM | POA: Diagnosis present

## 2013-11-12 DIAGNOSIS — I059 Rheumatic mitral valve disease, unspecified: Secondary | ICD-10-CM | POA: Diagnosis present

## 2013-11-12 DIAGNOSIS — Z66 Do not resuscitate: Secondary | ICD-10-CM | POA: Diagnosis present

## 2013-11-12 DIAGNOSIS — I359 Nonrheumatic aortic valve disorder, unspecified: Secondary | ICD-10-CM | POA: Diagnosis present

## 2013-11-12 LAB — CBC
HEMATOCRIT: 28.5 % — AB (ref 36.0–46.0)
HEMOGLOBIN: 8.3 g/dL — AB (ref 12.0–15.0)
MCH: 27.5 pg (ref 26.0–34.0)
MCHC: 29.1 g/dL — AB (ref 30.0–36.0)
MCV: 94.4 fL (ref 78.0–100.0)
Platelets: 214 10*3/uL (ref 150–400)
RBC: 3.02 MIL/uL — ABNORMAL LOW (ref 3.87–5.11)
RDW: 17.9 % — ABNORMAL HIGH (ref 11.5–15.5)
WBC: 5.4 10*3/uL (ref 4.0–10.5)

## 2013-11-12 LAB — TYPE AND SCREEN
ABO/RH(D): B POS
Antibody Screen: NEGATIVE

## 2013-11-12 LAB — BASIC METABOLIC PANEL
Anion gap: 11 (ref 5–15)
BUN: 22 mg/dL (ref 6–23)
CALCIUM: 9.1 mg/dL (ref 8.4–10.5)
CO2: 31 mEq/L (ref 19–32)
Chloride: 102 mEq/L (ref 96–112)
Creatinine, Ser: 1.97 mg/dL — ABNORMAL HIGH (ref 0.50–1.10)
GFR calc Af Amer: 28 mL/min — ABNORMAL LOW (ref >90)
GFR, EST NON AFRICAN AMERICAN: 24 mL/min — AB (ref >90)
GLUCOSE: 140 mg/dL — AB (ref 70–99)
Potassium: 4 mEq/L (ref 3.7–5.3)
Sodium: 144 mEq/L (ref 137–147)

## 2013-11-12 LAB — PROTIME-INR
INR: 2.91 — AB (ref 0.00–1.49)
INR: 2.2 — AB (ref 0.00–1.49)
Prothrombin Time: 30.4 seconds — ABNORMAL HIGH (ref 11.6–15.2)
Prothrombin Time: 24.4 seconds — ABNORMAL HIGH (ref 11.6–15.2)

## 2013-11-12 MED ORDER — HYDROCODONE-ACETAMINOPHEN 5-325 MG PO TABS
2.0000 | ORAL_TABLET | ORAL | Status: DC
  Administered 2013-11-12 – 2013-11-13 (×5): 2 via ORAL
  Filled 2013-11-12 (×7): qty 2

## 2013-11-12 MED ORDER — CETYLPYRIDINIUM CHLORIDE 0.05 % MT LIQD
7.0000 mL | Freq: Two times a day (BID) | OROMUCOSAL | Status: DC
  Administered 2013-11-12 – 2013-11-21 (×17): 7 mL via OROMUCOSAL

## 2013-11-12 MED ORDER — DIPHENHYDRAMINE HCL 25 MG PO CAPS
25.0000 mg | ORAL_CAPSULE | Freq: Once | ORAL | Status: AC
  Administered 2013-11-12: 25 mg via ORAL
  Filled 2013-11-12: qty 1

## 2013-11-12 MED ORDER — WARFARIN SODIUM 5 MG PO TABS: 2.5000 mg | ORAL_TABLET | ORAL | Status: DC

## 2013-11-12 MED ORDER — FUROSEMIDE 10 MG/ML IJ SOLN
20.0000 mg | Freq: Once | INTRAMUSCULAR | Status: AC
  Administered 2013-11-13: 20 mg via INTRAVENOUS
  Filled 2013-11-12: qty 2

## 2013-11-12 MED ORDER — SODIUM CHLORIDE 0.9 % IV SOLN
Freq: Once | INTRAVENOUS | Status: AC
  Administered 2013-11-12: 15:00:00 via INTRAVENOUS

## 2013-11-12 MED ORDER — ACETAMINOPHEN 325 MG PO TABS
650.0000 mg | ORAL_TABLET | Freq: Once | ORAL | Status: AC
  Administered 2013-11-12: 650 mg via ORAL
  Filled 2013-11-12: qty 2

## 2013-11-12 NOTE — Progress Notes (Signed)
A lumbar corset has been ordered for pt.  I called [employer] and they are closed today for the holiday.  I will call first thing in the morning and request that it be delivered ASAP.  We will then be able to mobilize pt and complete and evaluation.  Please call with any questions.    Pager:  [phone]

## 2013-11-12 NOTE — Progress Notes (Addendum)
Note: This document was prepared with digital dictation and possible smart phrase technology. Any transcriptional errors that result from this process are unintentional.   [name] [name] ION:[id number] DOB: 1941/02/28 DOA: 11/11/2013 PCP: [name], MD  Brief narrative: 73 y/o ?, h/o rheumatic fever , CVA 2440 combined systolic diastolic heart failure-s/p Maze procedure paroxysmal atrial fibrillation as well as mitral valve replacement 05/16/13, sinus bradycardia, admission 09/24/13, shortness of breath, strep viridans in blood culture, anticoagulation with Coumadin, CKD stage III, anemia of chronic kidney disease admitted 11/11/13 with accidental fall while moving with a walker and falling backwards-no sycope She had excruciating back pain and came to the emergency room and was found to have a fracture at the T12-L1 region through an area of prior fusion. Neurosurgery recommended a back brace none  Past medical history-As per Problem list Chart reviewed as below None  Consultants:  Neurosurgery consulted by emergency room  Await callback from he'lltNeuro intervnetionis  Procedures:  None  Antibiotics:  None   Subjective   alert and some amount of pain, no nausea no vomiting  Feels about the same baseline  In terms of her heart failure does not seem excessively swollen or short of breath States that the pain in her back as somewhat intolerable Not able to get up or sit up and states that she did not receive medication for a while as   Objective    Interim History: reviewed  Telemetry: sinus   Objective: Filed Vitals:   11/11/13 2300 11/11/13 2320 11/11/13 2340 11/12/13 0558  BP: 122/46 127/48 145/43 135/45  Pulse:  56 62 56  Temp:  97.8 F (36.6 C) 97.7 F (36.5 C) 97.8 F (36.6 C)  TempSrc:  Oral Oral Oral  Resp:  16 16 16   Height:   5\' 2"  (1.575 m)   Weight:   83.1 kg (183 lb 3.2 oz) 83 kg (182 lb 15.7 oz)  SpO2:  100% 96% 95%    Intake/Output  Summary (Last 24 hours) at 11/12/13 1019 Last data filed at 11/12/13 0911  Gross per 24 hour  Intake      3 ml  Output      0 ml  Net      3 ml    Exam:  General: Alert pleasant oriented Cardiovascular: No JVD, no bruit S1-S2 no murmur rub or gallop Respiratory: Clinically clear anteriorly Abdomen: Soft nontender nondistended no rebound Skin grade 2 lower extremity edema with some erythema bilaterally which is chronic Neuro grossly intact moving 4 limbs equally  Data Reviewed: Basic Metabolic Panel:  Recent Labs Lab 11/11/13 1831 11/12/13 0527  NA 141 144  K 3.6* 4.0  CL 100 102  CO2 30 31  GLUCOSE 124* 140*  BUN 21 22  CREATININE 1.97* 1.97*  CALCIUM 9.1 9.1   Liver Function Tests: No results found for this basename: AST, ALT, ALKPHOS, BILITOT, PROT, ALBUMIN,  in the last 168 hours No results found for this basename: LIPASE, AMYLASE,  in the last 168 hours No results found for this basename: AMMONIA,  in the last 168 hours CBC:  Recent Labs Lab 11/11/13 1831 11/12/13 0527  WBC 5.4 5.4  NEUTROABS 3.2  --   HGB 8.7* 8.3*  HCT 27.7* 28.5*  MCV 89.9 94.4  PLT 244 214   Cardiac Enzymes: No results found for this basename: CKTOTAL, CKMB, CKMBINDEX, TROPONINI,  in the last 168 hours BNP: No components found with this basename: POCBNP,  CBG: No results found  for this basename: GLUCAP,  in the last 168 hours  No results found for this or any previous visit (from the past 240 hour(s)).   Studies:              All Imaging reviewed and is as per above notation   Scheduled Meds: . amiodarone  100 mg Oral Daily  . antiseptic oral rinse  7 mL Mouth Rinse BID  . colchicine  0.3 mg Oral Daily  . oxybutynin  5 mg Oral QHS  . potassium chloride SA  20 mEq Oral Daily  . simvastatin  10 mg Oral q1800  . sodium chloride  3 mL Intravenous Q12H  . torsemide  80 mg Oral q morning - 10a  . [START ON 11/16/2013] warfarin  1.25 mg Oral Q Fri-1800  . warfarin  2.5 mg Oral  Once per day on Sun Mon Tue Wed Thu Sat  . Warfarin - Physician Dosing Inpatient   Does not apply q1800   Continuous Infusions:    Assessment/Plan:  1. Acute lumbar L1-T12 fracture in a setting of prior fusion-neurosurgery has recommended a back brace-I. have a call out to interventional neuro-radiologist to see what type of intervention can be performed to help this in the acute phase. If nothing can be done, she will get physical therapy to ambulate and mobilize with them and we will place a lumbar corset to help manage her pain. Will increase her Norco to 2 tablets every 4 scheduled and keep on morphine 2 mg every 2 when necessary 2. Combined systolic diastolic heart failure-last weight recorded in system was 175 pounds her baseline weight seems anywhere from 160-170. She's currently 182 pounds. Strict is and os, continue torsemide 80 milligrams every morning.  Repeat basic metabolic panel in a.m. 3. History paroxysmal atrial fibrillation status post Maze procedure-continue amiodarone for chronic arrhythmia suppression 100 daily, therapeutic on Coumadin with INR 2.9. Pharmacy dosing. Check magnesium in a.m.Marland Kitchen Keep on telemetry. Keep potassium above 4, continue K-Dur 20 every morning 4. Hyperlipidemia-potential interaction between Zocor and amiodarone. Discontinue simvastatin in hospital. Resume Lipitor on outpatient  5. Cardiorenal syndrome, chronic kidney disease stage 3-currently stable at present time. Continue medications as above. BN/creatinine on admission 21/1.9 and unchanged today 6. Normocytic anemia chronic disease/renal disease hemoglobin 8.3 this is up her baseline. Consider outpatient stool cards if not done already previous history of bacteremia strep viridans-resolved 7. History of gout, not in any specific distress. Continue colchicine 0.3 daily 8. Bladder incontinence continue oxybutynin 5 each bedtime  Code Status:Full  Family Communication: No family present currently    Disposition Plan: Await discussion with interventional neurology and we will discuss plan of care the family subsequently   Verneita Griffes, MD  [facility] Pager [phone] 11/12/2013, 10:19 AM    LOS: 1 day

## 2013-11-12 NOTE — Progress Notes (Signed)
OT Cancellation Note  Patient Details Name: [name] [name] MRN: [id number] DOB: [birth year]   Cancelled Treatment:    Reason Eval/Treat Not Completed: Fatigue/lethargy limiting ability to participate. Patient too fatigued to participate in OT eval this date. Patient wishes to rest/sleep.Pt also does not have corset brace available and is unable to get out of bed at this time. Will re-attempt evaluation at a later date.   Ailene Ravel, OTR/L,CBIS   11/12/2013, 12:51 PM

## 2013-11-12 NOTE — Care Management Note (Addendum)
Page 1 of 2   11/21/2013     1:56:39 PM CARE MANAGEMENT NOTE 11/21/2013  Patient:  [name] [name], [name] [name]   Account Number:  [id number]  Date Initiated:  11/12/2013  Documentation initiated by:  Vladimir Creeks  Subjective/Objective Assessment:   Patient admitted from home following a fall. she has a fractured T12/ L-1.  This is the site of a recent back surgery, and she spent several weeks in the hospital and the [facility] center. Patient feels she will need to go back to the     Action/Plan:   nursing home, but would like to go to [place] center this time. She is to have a kyphoplasty tomorrow, and actually may not need to go to SNF, if this improves her pain level, but will notify CSW   Anticipated DC Date:  11/14/2013   Anticipated DC Plan:  ACUTE TO ACUTE TRANS  In-house referral  Clinical Social Worker      DC Forensic scientist  CM consult      [facility] Choice  [place]   Choice offered to / List presented to:  NA   DME arranged  [facility]  WHEELCHAIR - MANUAL      DME agency  [place] arranged  HH-1 RN  HH-10 DISEASE MANAGEMENT  HH-2 PT  HH-3 OT  [place]      [place].   Status of service:  Completed, signed off Medicare Important Message given?  YES (If response is "NO", the following Medicare IM given date fields will be blank) Date Medicare IM given:  11/15/2013 Medicare IM given by:  [facility] Date Additional Medicare IM given:  11/19/2013 Additional Medicare IM given by:  Saverio Kader  Discharge Disposition:  [place]  Per UR Regulation:  Reviewed for med. necessity/level of care/duration of stay  If discussed at [place] of Stay Meetings, dates discussed:   11/20/2013    Comments:  11/21/13 Fuller Mandril, RN, BSN, NCM [phone] Request for PT daily for first 5 days; liason, Janae Sauce will notify office of request.   DME oxygen and wheelchair per [facility] [employer].  oxygen to be delivered prior to d/c home; concentrator and wheelchair will be delivered to home tomorrow.  9/16 [place], RN, BSN, [place] [phone] ADDITIONAL IM GIVEN 11/21/13 by Gardiner Ramus, RN, BSN, NCM  11/19/13 [place], RN, BSN, [place] [phone] Pt needs re-evaluation by PT now that she has had vertebroplasty.  Ideally would go to rehab facility but finances are going to be a big issue.  Will involve social work.  11/19/13 [place], RN, BSN, [place] [phone] Pt currently active with [facility] for RN/PT/OT services; would like to add NA and SW as well.   Resumption of care Face to Face requested.  Clover Mealy, RN of [facility] notified.  No DME needs identified at this time.  Weekend CM Handoff report: Hi Judson Roch, This is a patient of Lynnea Ferrier. SW/Donna called and updated that patient has no covered SNF days left and SNF co-pay will be 156/day.  DTR does not want to pay this and has elected to take patient home with [facility]. Active with [facility] prior to this admission and elects to resume services. I have entered [place] order, requested face to face and added to AVS. Just need for you to notify on-call [facility] Team.  Thanks, Have a good weekend! Crystal Hutchinson RN, BSN, MSHL, CCM  Nurse - Case Manager,   11/15/13 Fuller Mandril, RN, BSN, [place] [phone] New UTI now being treated with Rocephin- 1st dose 9/10 Need 3 day treatment before can safely perform procedure  11/14/13 1700 [place] Bolden RN/CM   Pt transferred to [facility] at her and family's request 11/12/13 1600 , Vladimir Creeks RN/CM

## 2013-11-12 NOTE — Care Management Utilization Note (Signed)
UR completed 

## 2013-11-13 ENCOUNTER — Encounter (HOSPITAL_COMMUNITY): Payer: Self-pay | Admitting: Cardiology

## 2013-11-13 DIAGNOSIS — N179 Acute kidney failure, unspecified: Secondary | ICD-10-CM

## 2013-11-13 DIAGNOSIS — I5043 Acute on chronic combined systolic (congestive) and diastolic (congestive) heart failure: Secondary | ICD-10-CM

## 2013-11-13 LAB — PREPARE FRESH FROZEN PLASMA
UNIT DIVISION: 0
Unit division: 0
Unit division: 0

## 2013-11-13 LAB — COMPREHENSIVE METABOLIC PANEL
ALT: 11 U/L (ref 0–35)
AST: 24 U/L (ref 0–37)
Albumin: 3.1 g/dL — ABNORMAL LOW (ref 3.5–5.2)
Alkaline Phosphatase: 92 U/L (ref 39–117)
Anion gap: 9 (ref 5–15)
BILIRUBIN TOTAL: 0.5 mg/dL (ref 0.3–1.2)
BUN: 26 mg/dL — ABNORMAL HIGH (ref 6–23)
CHLORIDE: 99 meq/L (ref 96–112)
CO2: 31 meq/L (ref 19–32)
Calcium: 9.5 mg/dL (ref 8.4–10.5)
Creatinine, Ser: 2.59 mg/dL — ABNORMAL HIGH (ref 0.50–1.10)
GFR calc non Af Amer: 17 mL/min — ABNORMAL LOW (ref >90)
GFR, EST AFRICAN AMERICAN: 20 mL/min — AB (ref >90)
Glucose, Bld: 106 mg/dL — ABNORMAL HIGH (ref 70–99)
Potassium: 4.2 mEq/L (ref 3.7–5.3)
Sodium: 139 mEq/L (ref 137–147)
Total Protein: 7.2 g/dL (ref 6.0–8.3)

## 2013-11-13 LAB — PROTIME-INR
INR: 2.1 — ABNORMAL HIGH (ref 0.00–1.49)
INR: 1.95 — AB (ref 0.00–1.49)
PROTHROMBIN TIME: 23.6 s — AB (ref 11.6–15.2)
Prothrombin Time: 22.2 seconds — ABNORMAL HIGH (ref 11.6–15.2)

## 2013-11-13 LAB — HEPARIN LEVEL (UNFRACTIONATED): Heparin Unfractionated: 0.3 IU/mL (ref 0.30–0.70)

## 2013-11-13 LAB — MAGNESIUM: Magnesium: 2.2 mg/dL (ref 1.5–2.5)

## 2013-11-13 MED ORDER — HEPARIN (PORCINE) IN NACL 100-0.45 UNIT/ML-% IJ SOLN
1500.0000 [IU]/h | INTRAMUSCULAR | Status: DC
  Administered 2013-11-13 – 2013-11-14 (×2): 1000 [IU]/h via INTRAVENOUS
  Administered 2013-11-15: 1500 [IU]/h via INTRAVENOUS
  Administered 2013-11-15: 1250 [IU]/h via INTRAVENOUS
  Filled 2013-11-13 (×7): qty 250

## 2013-11-13 MED ORDER — FUROSEMIDE 10 MG/ML IJ SOLN: 20.0000 mg | Freq: Two times a day (BID) | INTRAMUSCULAR | Status: DC

## 2013-11-13 MED ORDER — VITAMIN K1 10 MG/ML IJ SOLN
10.0000 mg | Freq: Once | INTRAMUSCULAR | Status: AC
  Administered 2013-11-13: 10 mg via INTRAMUSCULAR
  Filled 2013-11-13: qty 1

## 2013-11-13 MED ORDER — ACETAMINOPHEN 325 MG PO TABS
650.0000 mg | ORAL_TABLET | Freq: Once | ORAL | Status: AC
  Administered 2013-11-13: 650 mg via ORAL
  Filled 2013-11-13: qty 2

## 2013-11-13 MED ORDER — PHYTONADIONE 5 MG PO TABS
5.0000 mg | ORAL_TABLET | Freq: Once | ORAL | Status: AC
  Administered 2013-11-13: 5 mg via ORAL
  Filled 2013-11-13: qty 1

## 2013-11-13 MED ORDER — DIPHENHYDRAMINE HCL 25 MG PO CAPS
25.0000 mg | ORAL_CAPSULE | Freq: Once | ORAL | Status: AC
  Administered 2013-11-13: 25 mg via ORAL
  Filled 2013-11-13: qty 1

## 2013-11-13 MED ORDER — MORPHINE SULFATE 2 MG/ML IJ SOLN
1.0000 mg | INTRAMUSCULAR | Status: DC | PRN
  Administered 2013-11-14 – 2013-11-16 (×4): 1 mg via INTRAVENOUS
  Filled 2013-11-13 (×4): qty 1

## 2013-11-13 MED ORDER — SODIUM CHLORIDE 0.9 % IV SOLN
Freq: Once | INTRAVENOUS | Status: AC
  Administered 2013-11-13: 09:00:00 via INTRAVENOUS

## 2013-11-13 MED ORDER — FUROSEMIDE 10 MG/ML IJ SOLN
40.0000 mg | Freq: Once | INTRAMUSCULAR | Status: AC
  Administered 2013-11-13: 40 mg via INTRAVENOUS
  Filled 2013-11-13: qty 4

## 2013-11-13 MED ORDER — HYDROCODONE-ACETAMINOPHEN 5-325 MG PO TABS
2.0000 | ORAL_TABLET | Freq: Four times a day (QID) | ORAL | Status: DC | PRN
  Administered 2013-11-13 – 2013-11-14 (×2): 2 via ORAL
  Filled 2013-11-13 (×2): qty 2

## 2013-11-13 NOTE — Evaluation (Deleted)
Physical Therapy Evaluation Patient Details Name: [name] [name] MRN: [id number] DOB: 06/03/1940 Today's Date: 11/13/2013   History of Present Illness  Pt is a morbidly obese female who sustained a vertebral fx at T12-L1 on 11-11-13.  She has limited mobility, ambulates with a 4 wheeled walker short distances in the home.  She has had multiple admissions this year for CHF and was recently discharged from the [facility].  She lives alone with very close family supervision and assist.  Clinical Impression   Pt was extremely drowsy at the time of my evaluation.  Daughter was present.  A lumbar corset was delivered to pt by [employer] this morning.  Daughter states that pt is scheduled to have a vertebroplasty in the near future.  She is currently immobilized in the bed due to extreme back pain with any movement of her trunk.  She is now needing supplemental O2.  It was difficult to get a true picture of her strength and functional ability due to her drowsiness.  Currently, she required almost total assistance for any trunk or LE motion.  We initiated therapeutic exercise while in bed.  Lumbar brace was applied with pt supine and the Maxi Move lift was used to transfer pt to a chair.  She tolerated this fairly well.  Hopefully, her vertebroplasty will significantly reduce her pain and improve her mobility.  At this point, though, I anticipate the need for her to return to SNF.  Her daughter agrees.    Follow Up Recommendations SNF    Equipment Recommendations  None recommended by PT    Recommendations for Other Services   none    Precautions / Restrictions Precautions Precautions: Back Required Braces or Orthoses: Other Brace/Splint Other Brace/Splint: lumbar corset should be worn whenever out of bed Restrictions Weight Bearing Restrictions: No      Mobility  Bed Mobility Overal bed mobility: +2 for physical assistance                Transfers Overall transfer level: Needs  assistance               General transfer comment: a Maxi Lift was used to transfer out of bed due to her drowsiness, pain with any movement and morbid obesity  Ambulation/Gait                Stairs            Wheelchair Mobility    Modified Rankin (Stroke Patients Only)       Balance Overall balance assessment:  (not tested)                                           Pertinent Vitals/Pain Pain Assessment: No/denies pain (at rest--severe pain in back with any movement)    Home Living    lives alone with close family supervision                    Prior Function   ambulated with a 4 wheeled walker for short distances in the home, able to make light meals for herself              Hand Dominance        Extremity/Trunk Assessment   Upper Extremity Assessment: Defer to OT evaluation           Lower Extremity Assessment: RLE deficits/detail;LLE deficits/detail  RLE Deficits / Details: has had a TKR 2003 LLE Deficits / Details: this is her "bad" knee with severe DJD, limited flexion (50 degrees)     Communication      Cognition Arousal/Alertness: Lethargic;Suspect due to medications Behavior During Therapy: [facility] for tasks assessed/performed Overall Cognitive Status: History of cognitive impairments - at baseline       Memory: Decreased short-term memory              General Comments      Exercises General Exercises - Lower Extremity Ankle Circles/Pumps: AAROM;Both;10 reps;Supine Quad Sets: AAROM;Both;10 reps;Supine Short Arc Quad: AAROM;Both;10 reps;Supine Heel Slides: PROM;Both;10 reps;Supine Hip ABduction/ADduction: PROM;Both;10 reps;Supine      Assessment/Plan    PT Assessment Patient needs continued PT services  PT Diagnosis Generalized weakness;Acute pain   PT Problem List Decreased strength;Decreased activity tolerance;Decreased mobility;Cardiopulmonary status limiting activity;Obesity;Pain   PT Treatment Interventions Functional mobility training;Therapeutic activities;Therapeutic exercise   PT Goals (Current goals can be found in the Care Plan section) Acute Rehab PT Goals Patient Stated Goal: none stated PT Goal Formulation: With family Time For Goal Achievement: 11/27/13 Potential to Achieve Goals: Fair    Frequency Min 3X/week   Barriers to discharge Decreased caregiver support      Co-evaluation               End of Session Equipment Utilized During Treatment: Oxygen Activity Tolerance: Patient limited by fatigue;Patient limited by lethargy;Patient limited by pain Patient left: in chair;with call bell/phone within reach;with family/visitor present Nurse Communication: Mobility status;Need for lift equipment         Time: 1700-1749 PT Time Calculation (min): 69 min   Charges:   PT Evaluation $Initial PT Evaluation Tier I: 1 Procedure PT Treatments $Therapeutic Exercise: 8-22 mins $Therapeutic Activity: 8-22 mins   PT G Codes:          Sable Feil 11/13/2013, 4:48 PM

## 2013-11-13 NOTE — Consult Note (Signed)
Primary cardiologist: Dr. Carlyle Dolly Advanced CHF: Dr. [name] Consulting cardiologist: Dr. Satira Sark  Clinical Summary Ms. Lovins is a medically complex 73 y.o.female with past medical history outlined below, currently admitted to the hospital after a mechanical fall without syncope. She has been home for a few weeks following most recent complicated hospital stay, overall stable per her daughter's report. She was seated on a rolling walker, being pushed by her granddaughter, when she hit a bump and fell over backwards injuring her back. She has been found to have a T12-L1 fracture in a region of prior fusion surgery. She has a back brace in place, and is awaiting kyphoplasty once her INR has reduced off Coumadin. Pharmacy is following with plan to bridge with heparin, and she has been treated with FFP per the primary team. INR has decreased from 2.1-1.9 most recently.  We are consulted to follow her volume status given complicated history of recurrent diastolic heart failure with recent echocardiogram showing LVEF 50-55% with stable mitral bioprosthesis, pulmonary hypertension with PASP 50 mm mercury, and moderate aortic regurgitation.   Weight was measured 13 pounds heavier today compared to yesterday by bed scale, question accuracy of this finding. Daughter states that there were some extra blankets on the bed. Concern is that with her receiving 5 additional units of FFP today, that perhaps there is also some volume excess. She has been continued on outpatient Demadex with potassium supplements, has received some as needed doses of Lasix.  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 Pro-BNP was 1867. Followup chest x-ray is pending, last examination August showed PICC line with improved infiltrates.   Allergies  Allergen Reactions  . Indomethacin Other (See Comments)    dizziness  . Norvasc [Amlodipine Besylate] Cough    Medications Scheduled  Medications: . amiodarone  100 mg Oral Daily  . antiseptic oral rinse  7 mL Mouth Rinse BID  . colchicine  0.3 mg Oral Daily  . oxybutynin  5 mg Oral QHS  . potassium chloride SA  20 mEq Oral Daily  . sodium chloride  3 mL Intravenous Q12H  . torsemide  80 mg Oral q morning - 10a    Infusions: . heparin 1,000 Units/hr (11/13/13 1630)    PRN Medications: sodium chloride, HYDROcodone-acetaminophen, morphine injection, ondansetron (ZOFRAN) IV, ondansetron, sodium chloride   Past Medical History  Diagnosis Date  . COPD (chronic obstructive pulmonary disease)   . History of TIA (transient ischemic attack)     Diagnosed 2003  . History of stroke     2000 [place]  . Obesity   . Juvenile rheumatic fever     Age 12  . Essential hypertension, benign   . Hyperlipidemia   . Mitral regurgitation     Status post bioprosthetic MVR  . CKD (chronic kidney disease) stage 4, GFR 15-29 ml/min   . Paroxysmal atrial fibrillation     First diagnosed December 2014  . Chronic diastolic congestive heart failure   . Arthritis   . Aortic insufficiency due to bicuspid aortic valve     Moderate by TEE  . 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 S/P Maze operation for atrial fibrillation 05/16/2013    Complete bilateral atrial lesion set using cryothermy and bipolar radiofrequency ablation with oversewing of LA appendage    Past  Surgical History  Procedure Laterality Date  . Total knee arthroplasty Right   . Abdominal hysterectomy      Cervical Cancer  . Parathyroid/thyroid surgery      Tumor  . Tee without cardioversion N/A 04/06/2013    Procedure: TRANSESOPHAGEAL ECHOCARDIOGRAM (TEE);  Surgeon: [name], MD;  [facility];  Service: Cardiology;  Laterality: N/A;  . Minimally invasive maze procedure N/A 05/16/2013     Procedure: MINIMALLY INVASIVE MAZE PROCEDURE;  Surgeon: Rexene Alberts, MD;  Location: [facility];  Service: Open Heart Surgery;  Laterality: N/A;  . Intraoperative transesophageal echocardiogram N/A 05/16/2013    Procedure: INTRAOPERATIVE TRANSESOPHAGEAL ECHOCARDIOGRAM;  Surgeon: Rexene Alberts, MD;  Location: [place];  Service: Open Heart Surgery;  Laterality: N/A;  . Mitral valve replacement Right 05/16/2013    Procedure: MINIMALLY INVASIVE MITRAL VALVE (MV) REPLACEMENT;  Surgeon: Rexene Alberts, MD;  Location: [place];  Service: Open Heart Surgery;  Laterality: Right;  . Tee without cardioversion N/A 06/06/2013    Procedure: TRANSESOPHAGEAL ECHOCARDIOGRAM (TEE);  Surgeon: Larey Dresser, MD;  Location: [place];  Service: Cardiovascular;  Laterality: N/A;  . Cardioversion N/A 06/06/2013    Procedure: CARDIOVERSION;  Surgeon: Larey Dresser, MD;  Location[facility];  Service: Cardiovascular;  Laterality: N/A;    Family History  Problem Relation Age of Onset  . Heart failure Father   . Heart attack Brother     Social History Ms. Conover reports that she has never smoked. She has never used smokeless tobacco. Ms. Musolf reports that she does not drink alcohol.  Review of Systems Patient groggy with pain medications, complains of back pain. No breathlessness at rest or chest pain. No palpitations. Other systems reviewed and negative.  Physical Examination Blood pressure 122/54, pulse 54, temperature 98.4 F (36.9 C), temperature source Oral, resp. rate 18, height 5\' 2"  (1.575 m), weight 195 lb 1.7 oz (88.5 kg), SpO2 95.00%.  Intake/Output Summary (Last 24 hours) at 11/13/13 1653 Last data filed at 11/13/13 1300  Gross per 24 hour  Intake    360 ml  Output      0 ml  Net    360 ml   Currently not on telemetry.  Morbidly obese woman, no acute distress. HEENT: Conjunctiva and lids normal, oropharynx clear. Neck: Supple, increased girth, no elevated JVP or carotid bruits, no  thyromegaly. Lungs: Decreased breath sounds with rhonchi, nonlabored breathing at rest. Cardiac: Regular rate and rhythm, no S3 or significant systolic murmur, no pericardial rub. Abdomen: Soft, nontender, bowel sounds present, back brace on. Extremities: Trace edema, distal pulses 2+. Skin: Warm and dry. Musculoskeletal: No kyphosis. Neuropsychiatric: Alert and oriented x3 but groggy with pain medications.   Lab Results  Basic Metabolic Panel:  Recent Labs Lab 11/11/13 1831 11/12/13 0527 11/13/13 0536  NA 141 144 139  K 3.6* 4.0 4.2  CL 100 102 99  CO2 30 31 31   GLUCOSE 124* 140* 106*  BUN 21 22 26*  CREATININE 1.97* 1.97* 2.59*  CALCIUM 9.1 9.1 9.5  MG  --   --  2.2    Liver Function Tests:  Recent Labs Lab 11/13/13 0536  AST 24  ALT 11  ALKPHOS 92  BILITOT 0.5  PROT 7.2  ALBUMIN 3.1*    CBC:  Recent Labs Lab 11/11/13 1831 11/12/13 0527  WBC 5.4 5.4  NEUTROABS 3.2  --   HGB 8.7* 8.3*  HCT 27.7* 28.5*  MCV 89.9 94.4  PLT 244  214    Impression  1. History of chronic combined heart failure, diastolic predominant based on most recent evaluation with LVEF 50-55%. She also has prior documentation of mild RV dysfunction with pulmonary hypertension. Recent weight gain noted, suspect some inaccuracy in measurement, however she has been receiving units of FFP to reverse Coumadin in anticipation of kyphoplasty. She is not in any distress at this time.  2. Paroxysmal atrial fibrillation, on amiodarone status post prior cardioversion and more recently Maze procedure. Heart rate is regular on examination. Need followup ECG.  3. CKD stage 3-4, current creatinine 2.5.  4. History of mitral regurgitation status post bioprosthetic MVR, stable by recent echocardiogram.  5. Moderate aortic regurgitation.  6. T12-L1 fracture in region of prior fusion, status post mechanical fall without syncope.   Recommendations  Have reviewed records, discussed case with  patient and daughter are present. Will provide Lasix 40 mg IV dose today, she did receive 5 units of FFP earlier. Will otherwise plan to continue current dose of Demadex and potassium supplements, follow daily weights and renal function. She is not otherwise receiving any supplemental IV fluids. Her weight at CHF clinic followup visit in mid August was 175 pounds. Check ECG to verify sinus rhythm as well.   Satira Sark, M.D., F.A.C.C.

## 2013-11-13 NOTE — Progress Notes (Signed)
ANTICOAGULATION CONSULT NOTE - Initial Consult  Pharmacy Consult for Heparin Indication: Pt on chronic Coumadin PTA due to h/o afib and valve replacement.  Bridging to Heparin in anticipation of surgery.  Allergies  Allergen Reactions  . Indomethacin Other (See Comments)    dizziness  . Norvasc [Amlodipine Besylate] Cough   Patient Measurements: Height: 5\' 2"  (157.5 cm) Weight: 195 lb 1.7 oz (88.5 kg) IBW/kg (Calculated) : 50.1 Heparin Dosing Weight: 65.5Kg  Vital Signs: Temp: 98.4 F (36.9 C) (09/08 1453) Temp src: Oral (09/08 1453) BP: 122/54 mmHg (09/08 1453) Pulse Rate: 54 (09/08 1453)  Labs:  Recent Labs  11/11/13 1831 11/12/13 0527 11/12/13 2245 11/13/13 0536 11/13/13 1536  HGB 8.7* 8.3*  --   --   --   HCT 27.7* 28.5*  --   --   --   PLT 244 214  --   --   --   LABPROT 30.0* 30.4* 24.4* 23.6* 22.2*  INR 2.86* 2.91* 2.20* 2.10* 1.95*  CREATININE 1.97* 1.97*  --  2.59*  --    Estimated Creatinine Clearance: 20.3 ml/min (by C-G formula based on Cr of 2.59).  Medical History: Past Medical History  Diagnosis Date  . Chronic airway obstruction, not elsewhere classified   . Precordial pain   . Swelling of limb   . Dizziness and giddiness   . Unspecified transient cerebral ischemia     Diag. 2003  . Acute, but ill-defined, cerebrovascular disease     2000 [place]  . Shortness of breath   . Obesity   . Juvenile rheumatic fever     age 26  . Unspecified essential hypertension   . Hyperlipidemia   . Mitral regurgitation 02/16/2013  . Chronic kidney disease   . Paroxysmal atrial fibrillation 02/16/2013    Recurrent paroxysmal atrial fibrillation, first diagnosed December 2014  . Chronic diastolic congestive heart failure   . Arthritis   . Aortic insufficiency due to bicuspid aortic valve     moderate by TEE  . S/P minimally invasive mitral valve replacement with bioprosthetic valve and maze procedure 05/16/2013    27 mm Edwards magna mitral bovine  bioprosthetic tissue valve placed via right thoracotomy  . S/P Maze operation for atrial fibrillation 05/16/2013    Complete bilateral atrial lesion set using cryothermy and bipolar radiofrequency ablation with oversewing of LA appendage  . Chronic combined systolic and diastolic CHF (congestive heart failure)      a. ECHO (3/15): EF 40-45%, mildly dil RV with mildly dec sys fx, bioprosthetic MV .b. Echo (7/15) with EF 50-55%, mod AI, bioprosthetic MV ok.     Assessment: 73yo female on chronic Coumadin PTA for h/o valve replacement and afib.  Pt had accidental fall while moving with a walker and fell backwards.  Found to have a fracture at T12 - L-1 region.  Asked to initiate Heparin as bridge when INR < 2 in anticipation of surgery.  Goal of Therapy:  INR 2-3 Monitor platelets by anticoagulation protocol: Yes   Plan: Heparin infusion at 1000 units/hr Check heparin level in 6 hours then daily CBC daily while on Heparin Stop Heparin ~ 4 hours prior to surgery / procedure per MD  Hart Robinsons A 11/13/2013,4:15 PM

## 2013-11-13 NOTE — Evaluation (Signed)
Occupational Therapy Evaluation Patient Details Name: [name] [name] MRN: [id number] DOB: Apr 15, 1940 Today's Date: 11/13/2013    History of Present Illness 73 yo female was being wheeled in her wheelchair today by her 57 y/o granddaughter when they slipped off the sidewalk edge and she fell out of her chair and hurt her back.  No LOC.  Has been in/out hospital most of this year for her chf issues.  Was just getting out of rehab 2 weeks ago and was back at home living alone.  Was recovering well.  Family very involved and checks on her frequently.   Clinical Impression   Pt in bed upon therapy arrival with family present. Patient recently medicated with morphine. Patient drowsy but agreeable to answer therapist's questions. Corset brace unavailable so patient was not moved from bed. Patient does not appear to require additional OT services. Pt lives alone and has family check on her frequently.     Follow Up Recommendations  No OT follow up    Equipment Recommendations  None recommended by OT       Precautions / Restrictions Precautions Precautions: Back Precaution Comments: Patient is to wear Corset brace. Brace is not available at this time.         Balance                                                            Pertinent Vitals/Pain Pain Assessment: No/denies pain (pt was recently medicated with Morphine)     Hand Dominance Right   Extremity/Trunk Assessment Upper Extremity Assessment Upper Extremity Assessment: Overall WFL for tasks assessed   Lower Extremity Assessment Lower Extremity Assessment: Defer to PT evaluation       Communication Communication Communication: No difficulties   Cognition Arousal/Alertness: Suspect due to medications (drowsy) Behavior During Therapy: WFL for tasks assessed/performed Overall Cognitive Status: History of cognitive impairments - at baseline                                [place] expects to be discharged to:: Private residence Living Arrangements: Alone (sone and sister check on patient frequently) Available Help at Discharge: Family;Available PRN/intermittently Type of Home: House Home Access: Ramped entrance     Home Layout: One level     Bathroom Shower/Tub: Tub/shower unit         Home Equipment: Environmental consultant - 2 wheels;Shower seat;Wheelchair - Scientist, physiological: Reacher;Long-handled sponge        Prior Functioning/Environment Level of Independence: Independent with assistive device(s)        Comments: Family fixes meals, visits, helps with housework and shopping    OT Diagnosis:                 Barriers to D/C:  None             End of Session    Activity Tolerance: Other (comment);Patient tolerated treatment well (limited by drowsiness. ) Patient left: in bed;with call bell/phone within reach;with family/visitor present   Time: 0900-0910 OT Time Calculation (min): 10 min Charges:  OT General Charges $OT Visit: 1 Procedure OT Evaluation $Initial OT Evaluation Tier I: 1 Procedure G-Codes:    Ailene Ravel, OTR/L,CBIS   11/13/2013, 9:21  AM    

## 2013-11-13 NOTE — Progress Notes (Signed)
Note: This document was prepared with digital dictation and possible smart phrase technology. Any transcriptional errors that result from this process are unintentional.   [name] [name]:[id number] DOB: October 26, 1940 DOA: 11/11/2013 PCP: [name], MD  Brief narrative: 73 y/o ?, h/o rheumatic fever , CVA 5397 combined systolic diastolic heart failure-s/p Maze procedure paroxysmal atrial fibrillation as well as mitral valve replacement 05/16/13, sinus bradycardia, admission 09/24/13, shortness of breath, strep viridans in blood culture, anticoagulation with Coumadin, CKD stage III, anemia of chronic kidney disease admitted 11/11/13 with accidental fall while moving with a walker and falling backwards-no sycope She had excruciating back pain and came to the emergency room and was found to have a fracture at the T12-L1 region through an area of prior fusion.  Neurosurgery recommended a back brace Ir was consulted for kypho-plasty/Vertebro-plasty and we are in process of having this arranged pending insurance  Past medical history-As per Problem list Chart reviewed as below None  Consultants:  Neurosurgery consulted by emergency room  Neuro interventionst  Procedures:  None  Antibiotics:  None   Subjective   Alert and some amount of pain, no nausea no vomiting  Feels ok but noted to be somewhat sleepy States that the pain in her back a little better Family concerned about heart issues and need for cardiology to see patient   Objective    Interim History: reviewed  Telemetry: sinus   Objective: Filed Vitals:   11/13/13 1210 11/13/13 1300 11/13/13 1407 11/13/13 1453  BP: 120/56 113/45 121/43 122/54  Pulse: 54 54 55 54  Temp: 98.4 F (36.9 C) 98.2 F (36.8 C) 98.1 F (36.7 C) 98.4 F (36.9 C)  TempSrc: Oral   Oral  Resp: 18 18 18 18   Height:      Weight:      SpO2: 95% 95% 97% 95%    Intake/Output Summary (Last 24 hours) at 11/13/13 1517 Last data filed  at 11/13/13 1300  Gross per 24 hour  Intake    360 ml  Output      0 ml  Net    360 ml    Exam:  General: Alert pleasant oriented Cardiovascular: No JVD, no bruit S1-S2 no murmur rub or gallop Respiratory: Clinically clear anteriorly Abdomen: Soft nontender nondistended no rebound Skin grade 2 lower extremity edema with some erythema bilaterally which is chronic Neuro grossly intact moving 4 limbs equally  Data Reviewed: Basic Metabolic Panel:  Recent Labs Lab 11/11/13 1831 11/12/13 0527 11/13/13 0536  NA 141 144 139  K 3.6* 4.0 4.2  CL 100 102 99  CO2 30 31 31   GLUCOSE 124* 140* 106*  BUN 21 22 26*  CREATININE 1.97* 1.97* 2.59*  CALCIUM 9.1 9.1 9.5  MG  --   --  2.2   Liver Function Tests:  Recent Labs Lab 11/13/13 0536  AST 24  ALT 11  ALKPHOS 92  BILITOT 0.5  PROT 7.2  ALBUMIN 3.1*   No results found for this basename: LIPASE, AMYLASE,  in the last 168 hours No results found for this basename: AMMONIA,  in the last 168 hours CBC:  Recent Labs Lab 11/11/13 1831 11/12/13 0527  WBC 5.4 5.4  NEUTROABS 3.2  --   HGB 8.7* 8.3*  HCT 27.7* 28.5*  MCV 89.9 94.4  PLT 244 214   Cardiac Enzymes: No results found for this basename: CKTOTAL, CKMB, CKMBINDEX, TROPONINI,  in the last 168 hours BNP: No components found with this basename: POCBNP,  CBG: No results found for this basename: GLUCAP,  in the last 168 hours  No results found for this or any previous visit (from the past 240 hour(s)).   Studies:              All Imaging reviewed and is as per above notation   Scheduled Meds: . amiodarone  100 mg Oral Daily  . antiseptic oral rinse  7 mL Mouth Rinse BID  . colchicine  0.3 mg Oral Daily  . HYDROcodone-acetaminophen  2 tablet Oral Q4H  . oxybutynin  5 mg Oral QHS  . potassium chloride SA  20 mEq Oral Daily  . sodium chloride  3 mL Intravenous Q12H  . torsemide  80 mg Oral q morning - 10a   Continuous Infusions:     Assessment/Plan:  1. Acute lumbar L1-T12 fracture in a setting of prior fusion-neurosurgery has recommended a back brace place a lumbar corset to help manage her pain.sleepy so pain meds cut back.   When INR ~1.7 and insurance pre-auth obtained will send to [facility] for kyphoplasty/vertebroplasty 2. Combined systolic diastolic heart failure-last weight recorded in system was 175 pounds her baseline weight seems anywhere from 160-170. Weight 88.5-->83.1 Kg.  Strict is and os, continue torsemide 80 milligrams every morning.  Repeat basic metabolic panel in a.m. 3. History paroxysmal atrial fibrillation status post Maze procedure-continue amiodarone for chronic arrhythmia suppression 100 daily, therapeutic on Coumadin with INR 2.9. Pharmacy dosing. Check magnesium in a.m.Marland Kitchen Keep on telemetry. Keep potassium above 4, continue K-Dur 20 every morning 4. Hyperlipidemia-potential interaction between Zocor and amiodarone. Discontinue simvastatin in hospital. Resume Lipitor on outpatient  5. Cardiorenal syndrome, chronic kidney disease stage 3-currently stable at present time. Continue medications as above. BuN/creatinine on admission 21/1.9- BUnCreat 26/2.5 so keep even volume-do not aggressively diurese 6. Normocytic anemia chronic disease/renal disease hemoglobin 8.3 this is up her baseline. Consider outpatient stool cards if not done already previous  7. history of bacteremia strep viridans-resolved 8. History of gout, not in any specific distress. Continue colchicine 0.3 daily 9. Bladder incontinence continue oxybutynin 5 each bedtime  Code Status:Full  Family Communication: discussed with daughter at bedsdie Disposition Plan: Await discussion with interventional neurology and we will discuss plan of care the family subsequently   Verneita Griffes, MD  [facility] Pager [phone] 11/13/2013, 3:17 PM    LOS: 2 days

## 2013-11-14 ENCOUNTER — Inpatient Hospital Stay (HOSPITAL_COMMUNITY): Payer: Medicare HMO

## 2013-11-14 DIAGNOSIS — Z9889 Other specified postprocedural states: Secondary | ICD-10-CM

## 2013-11-14 DIAGNOSIS — J9601 Acute respiratory failure with hypoxia: Secondary | ICD-10-CM | POA: Diagnosis present

## 2013-11-14 DIAGNOSIS — Z952 Presence of prosthetic heart valve: Secondary | ICD-10-CM

## 2013-11-14 DIAGNOSIS — J9600 Acute respiratory failure, unspecified whether with hypoxia or hypercapnia: Secondary | ICD-10-CM

## 2013-11-14 DIAGNOSIS — I4891 Unspecified atrial fibrillation: Secondary | ICD-10-CM

## 2013-11-14 LAB — BASIC METABOLIC PANEL
Anion gap: 13 (ref 5–15)
BUN: 34 mg/dL — ABNORMAL HIGH (ref 6–23)
CHLORIDE: 97 meq/L (ref 96–112)
CO2: 29 mEq/L (ref 19–32)
Calcium: 9.4 mg/dL (ref 8.4–10.5)
Creatinine, Ser: 2.82 mg/dL — ABNORMAL HIGH (ref 0.50–1.10)
GFR, EST AFRICAN AMERICAN: 18 mL/min — AB (ref >90)
GFR, EST NON AFRICAN AMERICAN: 16 mL/min — AB (ref >90)
GLUCOSE: 105 mg/dL — AB (ref 70–99)
Potassium: 4.7 mEq/L (ref 3.7–5.3)
Sodium: 139 mEq/L (ref 137–147)

## 2013-11-14 LAB — URINE MICROSCOPIC-ADD ON

## 2013-11-14 LAB — URINALYSIS, ROUTINE W REFLEX MICROSCOPIC
Bilirubin Urine: NEGATIVE
GLUCOSE, UA: NEGATIVE mg/dL
Ketones, ur: 15 mg/dL — AB
Nitrite: POSITIVE — AB
PROTEIN: 100 mg/dL — AB
Specific Gravity, Urine: 1.025 (ref 1.005–1.030)
Urobilinogen, UA: 0.2 mg/dL (ref 0.0–1.0)
pH: 5.5 (ref 5.0–8.0)

## 2013-11-14 LAB — CBC WITH DIFFERENTIAL/PLATELET
BASOS ABS: 0 10*3/uL (ref 0.0–0.1)
BASOS PCT: 0 % (ref 0–1)
Eosinophils Absolute: 0.3 10*3/uL (ref 0.0–0.7)
Eosinophils Relative: 4 % (ref 0–5)
HCT: 26.3 % — ABNORMAL LOW (ref 36.0–46.0)
Hemoglobin: 8 g/dL — ABNORMAL LOW (ref 12.0–15.0)
Lymphocytes Relative: 13 % (ref 12–46)
Lymphs Abs: 1 10*3/uL (ref 0.7–4.0)
MCH: 28.2 pg (ref 26.0–34.0)
MCHC: 30.4 g/dL (ref 30.0–36.0)
MCV: 92.6 fL (ref 78.0–100.0)
Monocytes Absolute: 1.1 10*3/uL — ABNORMAL HIGH (ref 0.1–1.0)
Monocytes Relative: 14 % — ABNORMAL HIGH (ref 3–12)
NEUTROS ABS: 5.6 10*3/uL (ref 1.7–7.7)
NEUTROS PCT: 69 % (ref 43–77)
PLATELETS: 241 10*3/uL (ref 150–400)
RBC: 2.84 MIL/uL — ABNORMAL LOW (ref 3.87–5.11)
RDW: 17.9 % — AB (ref 11.5–15.5)
WBC: 8 10*3/uL (ref 4.0–10.5)

## 2013-11-14 LAB — PROTIME-INR
INR: 1.96 — AB (ref 0.00–1.49)
PROTHROMBIN TIME: 22.3 s — AB (ref 11.6–15.2)

## 2013-11-14 LAB — RENAL FUNCTION PANEL
ALBUMIN: 3.2 g/dL — AB (ref 3.5–5.2)
ANION GAP: 12 (ref 5–15)
BUN: 34 mg/dL — ABNORMAL HIGH (ref 6–23)
CO2: 30 mEq/L (ref 19–32)
Calcium: 9.4 mg/dL (ref 8.4–10.5)
Chloride: 97 mEq/L (ref 96–112)
Creatinine, Ser: 2.83 mg/dL — ABNORMAL HIGH (ref 0.50–1.10)
GFR calc non Af Amer: 16 mL/min — ABNORMAL LOW (ref >90)
GFR, EST AFRICAN AMERICAN: 18 mL/min — AB (ref >90)
Glucose, Bld: 104 mg/dL — ABNORMAL HIGH (ref 70–99)
POTASSIUM: 4.7 meq/L (ref 3.7–5.3)
Phosphorus: 5.5 mg/dL — ABNORMAL HIGH (ref 2.3–4.6)
SODIUM: 139 meq/L (ref 137–147)

## 2013-11-14 LAB — PREPARE FRESH FROZEN PLASMA
Unit division: 0
Unit division: 0

## 2013-11-14 LAB — HEPARIN LEVEL (UNFRACTIONATED): Heparin Unfractionated: 0.46 IU/mL (ref 0.30–0.70)

## 2013-11-14 MED ORDER — NALOXONE HCL 0.4 MG/ML IJ SOLN
0.2000 mg | INTRAMUSCULAR | Status: DC | PRN
  Filled 2013-11-14: qty 1

## 2013-11-14 MED ORDER — NALOXONE HCL 0.4 MG/ML IJ SOLN: 0.2000 mg | Freq: Four times a day (QID) | INTRAMUSCULAR | Status: DC | PRN

## 2013-11-14 MED ORDER — HYDROCODONE-ACETAMINOPHEN 5-325 MG PO TABS
1.0000 | ORAL_TABLET | Freq: Four times a day (QID) | ORAL | Status: DC | PRN
  Administered 2013-11-16 – 2013-11-21 (×6): 1 via ORAL
  Filled 2013-11-14 (×10): qty 1

## 2013-11-14 NOTE — Clinical Social Work Placement (Signed)
Clinical Social Work Department CLINICAL SOCIAL WORK PLACEMENT NOTE 11/14/2013  Patient:  [name] [name], [name] [name]  Account Number:  [id number] Admit date:  11/11/2013  Clinical Social Worker:  Benay Pike, LCSW  Date/time:  11/14/2013 09:28 AM  Clinical Social Work is seeking post-discharge placement for this patient at the following level of care:   [place]   (*CSW will update this form in Epic as items are completed)   11/14/2013  Patient/family provided with [place] Department of Clinical Social Work's list of facilities offering this level of care within the geographic area requested by the patient (or if unable, by the patient's family).  11/14/2013  Patient/family informed of their freedom to choose among providers that offer the needed level of care, that participate in Medicare, Medicaid or managed care program needed by the patient, have an available bed and are willing to accept the patient.  11/14/2013  Patient/family informed of [facility]' ownership interest in [facility], as well as of the fact that they are under no obligation to receive care at this facility.  PASARR submitted to EDS on  PASARR number received on   FL2 transmitted to all facilities in geographic area requested by pt/family on  11/14/2013 FL2 transmitted to all facilities within larger geographic area on   Patient informed that his/her managed care company has contracts with or will negotiate with  certain facilities, including the following:     Patient/family informed of bed offers received:   Patient chooses bed at  Physician recommends and patient chooses bed at    Patient to be transferred to  on   Patient to be transferred to facility by  Patient and family notified of transfer on  Name of family member notified:    The following physician request were entered in Epic:   Additional Comments: Pt has existing pasarr.  Benay Pike, [place]

## 2013-11-14 NOTE — Progress Notes (Signed)
Primary cardiologist: Dr. Carlyle Dolly Advanced CHF: Dr. [name] Consulting cardiologist: Dr. Satira Sark  Subjective:   Patient appears comfortable, somewhat confused this morning according to her daughter. Mild back pain. No breathlessness at rest.   Objective:   Temp:  [98 F (36.7 C)-98.4 F (36.9 C)] 98.4 F (36.9 C) (09/09 0710) Pulse Rate:  [54-63] 63 (09/09 0710) Resp:  [18-20] 20 (09/09 0710) BP: (105-139)/(39-59) 105/39 mmHg (09/09 0710) SpO2:  [92 %-97 %] 95 % (09/09 0910) Weight:  [190 lb 7.6 oz (86.4 kg)] 190 lb 7.6 oz (86.4 kg) (09/09 0854)    Filed Weights   11/12/13 0558 11/13/13 4650 11/14/13 0854  Weight: 182 lb 15.7 oz (83 kg) 195 lb 1.7 oz (88.5 kg) 190 lb 7.6 oz (86.4 kg)    Intake/Output Summary (Last 24 hours) at 11/14/13 0916 Last data filed at 11/13/13 1700  Gross per 24 hour  Intake    240 ml  Output      0 ml  Net    240 ml    Exam:  General: Obese, chronically ill-appearing, no distress.  HEENT: Elevated JVP, no bruits.  Lungs: Decreased breath sounds with rhonchi.  Cardiac: RRR, no gallop.  Extremities: Trace edema.   Lab Results:  Basic Metabolic Panel:  Recent Labs Lab 11/12/13 0527 11/13/13 0536 11/14/13 0547  NA 144 139 139  139  K 4.0 4.2 4.7  4.7  CL 102 99 97  97  CO2 31 31 29  30   GLUCOSE 140* 106* 105*  104*  BUN 22 26* 34*  34*  CREATININE 1.97* 2.59* 2.82*  2.83*  CALCIUM 9.1 9.5 9.4  9.4  MG  --  2.2  --     Liver Function Tests:  Recent Labs Lab 11/13/13 0536 11/14/13 0547  AST 24  --   ALT 11  --   ALKPHOS 92  --   BILITOT 0.5  --   PROT 7.2  --   ALBUMIN 3.1* 3.2*    CBC:  Recent Labs Lab 11/11/13 1831 11/12/13 0527 11/14/13 0547  WBC 5.4 5.4 8.0  HGB 8.7* 8.3* 8.0*  HCT 27.7* 28.5* 26.3*  MCV 89.9 94.4 92.6  PLT 244 214 241    Coagulation:  Recent Labs Lab 11/13/13 0536 11/13/13 1536 11/14/13 0547  INR 2.10* 1.95* 1.96*     Medications:     Scheduled Medications: . amiodarone  100 mg Oral Daily  . antiseptic oral rinse  7 mL Mouth Rinse BID  . colchicine  0.3 mg Oral Daily  . oxybutynin  5 mg Oral QHS  . potassium chloride SA  20 mEq Oral Daily  . sodium chloride  3 mL Intravenous Q12H  . torsemide  80 mg Oral q morning - 10a     Infusions: . heparin 1,000 Units/hr (11/13/13 1630)     PRN Medications:  sodium chloride, HYDROcodone-acetaminophen, morphine injection, ondansetron (ZOFRAN) IV, ondansetron, sodium chloride   Assessment:   1. History of chronic combined heart failure, diastolic predominant based on most recent evaluation with LVEF 50-55%. She also has prior documentation of mild RV dysfunction with pulmonary hypertension.  Weight is down 5 pounds, intake and output not accurate. Single dose of IV Lasix given yesterday on top of her standing Demadex. She is not receiving any additional IV fluids today except that received with heparin.  2. Paroxysmal atrial fibrillation, on amiodarone status post prior cardioversion and more recently Maze procedure. Heart rate is regular  on examination.  3. 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  4. History of mitral regurgitation status post bioprosthetic MVR, stable by recent echocardiogram.   5. Moderate aortic regurgitation.   6. T12-L1 fracture in region of prior fusion, status post mechanical fall without syncope. Coumadin held, FFP given for primary team, INR is 1.9 today. She is on heparin bridge per pharmacy.    Plan/Discussion:    Discussed with patient and daughter in room today. I am not certain about plan for kyphoplasty, INR is now 1.9 and she is on heparin bridge. I spoke with Dr. Roderic Palau and discussed patient's history including tenuous volume status and renal insufficiency with previous cardiorenal syndrome. I recommended that since the patient is being transferred to [facility] for kyphoplasty, it would make sense for her to stay at that facility  on the hospitalist team with followup by the Advanced CHF team for optimal management. Patient's daughter was in fact preferring this option anyway, voiced a lot of confidence in Dr. Aundra Dubin.   Satira Sark, M.D., F.A.C.C.

## 2013-11-14 NOTE — Plan of Care (Signed)
Pt weight 190.08lbs today

## 2013-11-14 NOTE — Progress Notes (Signed)
[place] for Heparin Indication: Afib  Allergies  Allergen Reactions  . Indomethacin Other (See Comments)    dizziness  . Norvasc [Amlodipine Besylate] Cough   Patient Measurements: Height: 5\' 2"  (157.5 cm) Weight: 195 lb 1.7 oz (88.5 kg) IBW/kg (Calculated) : 50.1 Heparin Dosing Weight: 70 Kg  Vital Signs: Temp: 98.4 F (36.9 C) (09/09 0710) Temp src: Oral (09/09 0710) BP: 105/39 mmHg (09/09 0710) Pulse Rate: 63 (09/09 0710)  Labs:  Recent Labs  11/11/13 1831 11/12/13 0527  11/13/13 0536 11/13/13 1536 11/13/13 2218 11/14/13 0547  HGB 8.7* 8.3*  --   --   --   --  8.0*  HCT 27.7* 28.5*  --   --   --   --  26.3*  PLT 244 214  --   --   --   --  241  LABPROT 30.0* 30.4*  < > 23.6* 22.2*  --  22.3*  INR 2.86* 2.91*  < > 2.10* 1.95*  --  1.96*  HEPARINUNFRC  --   --   --   --   --  0.30 0.46  CREATININE 1.97* 1.97*  --  2.59*  --   --  2.82*  2.83*  < > = values in this interval not displayed. Estimated Creatinine Clearance: 18.6 ml/min (by C-G formula based on Cr of 2.82).  Medical History: Past Medical History  Diagnosis Date  . COPD (chronic obstructive pulmonary disease)   . History of TIA (transient ischemic attack)     Diagnosed 2003  . History of stroke     2000 [place]  . Obesity   . Juvenile rheumatic fever     Age 38  . Essential hypertension, benign   . Hyperlipidemia   . Mitral regurgitation     Status post bioprosthetic MVR  . CKD (chronic kidney disease) stage 4, GFR 15-29 ml/min   . Paroxysmal atrial fibrillation     First diagnosed December 2014  . Chronic diastolic congestive heart failure   . Arthritis   . Aortic insufficiency due to bicuspid aortic valve     Moderate by TEE  . 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 S/P Maze operation for atrial  fibrillation 05/16/2013    Complete bilateral atrial lesion set using cryothermy and bipolar radiofrequency ablation with oversewing of LA appendage   Assessment: 73yo female on chronic Coumadin PTA for h/o valve replacement and afib.  Pt had accidental fall with fracture at T12 - L-1 region.  She is awaiting insurance approval for kyphoplasty/vertebroplasty.   INR was therapeutic on admission.  This has been reversed with Vit K 10mg  SQ & 5mg  PO on 9/8 + 5 units FFP.   Heparin bridge while INR<2.   Heparin level therapeutic.  No bleeding noted.   Goal of Therapy:  INR 2-3 Monitor platelets by anticoagulation protocol: Yes   Plan: Continue Heparin infusion at 1000 units/hr Daily heparin level & CBC while on Heparin F/U plans for surgery--> Stop Heparin ~ 4 hours prior to surgery per MD  Biagio Borg 11/14/2013,8:48 AM

## 2013-11-14 NOTE — Plan of Care (Signed)
Pt and family made aware of transfer and report called to [facility].  Pt going to room [facility] Ouida Sills, RN) [facility] called and report given.

## 2013-11-14 NOTE — Progress Notes (Signed)
Patient has arrived from [facility]. Denies pain at this time. Heparin is running at 1ml/hour.

## 2013-11-14 NOTE — Progress Notes (Signed)
Pts. Family stating that pt. Has been c/o of R hip pain x 2 days post fall. On call NP, K. Schorr, made aware. New orders for X-ray received. RN will continue to monitor pt. [name] [name], [name] [name]

## 2013-11-14 NOTE — Clinical Social Work Psychosocial (Signed)
Clinical Social Work Department BRIEF PSYCHOSOCIAL ASSESSMENT 11/14/2013  Patient:  [name] [name], [name] [name]     Account Number:  [id number]     Admit date:  11/11/2013  Clinical Social Worker:  Wyatt Haste  Date/Time:  11/14/2013 09:32 AM  Referred by:  CSW  Date Referred:  11/14/2013 Referred for  SNF Placement   Other Referral:   Interview type:  Patient Other interview type:   daughter- [name]    PSYCHOSOCIAL DATA Living Status:  ALONE Admitted from facility:   Level of care:   Primary support name:  [name] Primary support relationship to patient:  CHILD, ADULT Degree of support available:   supportive    CURRENT CONCERNS Current Concerns  Post-Acute Placement   Other Concerns:    SOCIAL WORK ASSESSMENT / PLAN CSW met with pt and pt's daughter [name] at bedside. Pt alert, but has had confusion since yesterday per [facility]. She was oriented to self only at time of CSW visit. Pt came to hospital after falling off of rollator and has lumbar fracture. Awaiting kyphoplasty. [name] states pt lives alone, but family is very involved and either check on pt or stay with her often. At baseline, she ambulates with a walker and is independent with ADLs. [name] indicates that pt has been in the hospital a lot the past year and has been to SNF twice in this time period. Pt recently returned home from [facility] a few weeks ago after spending 20 days at rehab. PT evaluated pt yesterday and feels pt will most likely need to return to SNF prior to going home. CSW discussed this with them and they are agreeable. SNF list provided. [name] said she is pt's only child and will move in with pt if needed, but will not consider pt staying longer than just for rehab. She is also requesting transfer to [facility] as they are familiar with cardiology there and would feel more comfortable. Cardiology here plans to discuss with hospitalist. [name] agreeable to [place] sending referral for SNF to [facility] only at this  point. Will notify [employer] of probable SNF need.   Assessment/plan status:  Psychosocial Support/Ongoing Assessment of Needs Other assessment/ plan:   Information/referral to community resources:   SNF list    PATIENT'S/FAMILY'S RESPONSE TO PLAN OF CARE: Pt pleasantly confused during assessment. Pt's daughter agreeable to short term SNF at d/c. CSW will follow up.       Benay Pike, [place]

## 2013-11-14 NOTE — Progress Notes (Signed)
Note: This document was prepared with digital dictation and possible smart phrase technology. Any transcriptional errors that result from this process are unintentional.   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  Neurosurgery recommended a back brace Neuro Interventional radiology was consulted for kypho-plasty/Vertebro-plasty. Her coumadin has been held for the procedure and she has been started on heparin bridge.  Once INR is less than 1.7, heparin will need to be discontinue 4 hours before procedure. Plans are to transfer to [facility] today for definitive treatment.  Past medical history-As per Problem list Chart reviewed as below None  Consultants:  Neurosurgery consulted by emergency room  Neuro interventionst  Cardiology  Procedures:  None  Antibiotics:  None   Subjective  Reports back pain is controlled when she is not moving. Family reports that she is less confused today. Feels that her breathing is near baseline, although she is requiring supplemental oxygen. Denies any chest pain. Patient is very adamant that she wishes to be transferred to [facility]   Objective    Interim History: reviewed  Telemetry: sinus   Objective: Filed Vitals:   11/13/13 2032 11/14/13 0710 11/14/13 0854 11/14/13 0910  BP:  131/49 105/39    Pulse: 55 63    Temp: 98.2 F (36.8 C) 98.4 F (36.9 C)    TempSrc: Oral Oral    Resp: 20 20    Height:      Weight:   86.4 kg (190 lb 7.6 oz)   SpO2: 97% 93%  95%    Intake/Output Summary (Last 24 hours) at 11/14/13 1047 Last data filed at 11/14/13 0924  Gross per 24 hour  Intake    240 ml  Output      0 ml  Net    240 ml    Exam:  General: Alert pleasant oriented Cardiovascular: No JVD, no bruit S1-S2 no murmur rub or gallop Respiratory: Clinically clear anteriorly Abdomen: Soft nontender nondistended no rebound Skin trace lower extremity edema with some erythema bilaterally which is chronic Neuro grossly intact moving 4 limbs equally  Data Reviewed: Basic Metabolic Panel:  Recent Labs Lab 11/11/13 1831 11/12/13 0527 11/13/13 0536 11/14/13 0547  NA 141 144 139 139  139  K 3.6* 4.0 4.2 4.7  4.7  CL 100 102 99 97  97  CO2 30 31 31 29  30   GLUCOSE 124* 140* 106* 105*  104*  BUN 21 22 26* 34*  34*  CREATININE 1.97* 1.97* 2.59* 2.82*  2.83*  CALCIUM 9.1 9.1 9.5 9.4  9.4  MG  --   --  2.2  --   PHOS  --   --   --  5.5*   Liver Function Tests:  Recent Labs Lab 11/13/13 0536 11/14/13 0547  AST 24  --   ALT 11  --   ALKPHOS 92  --   BILITOT 0.5  --  PROT 7.2  --   ALBUMIN 3.1* 3.2*   No results found for this basename: LIPASE, AMYLASE,  in the last 168 hours No results found for this basename: AMMONIA,  in the last 168 hours CBC:  Recent Labs Lab 11/11/13 1831 11/12/13 0527 11/14/13 0547  WBC 5.4 5.4 8.0  NEUTROABS 3.2  --  5.6  HGB 8.7* 8.3* 8.0*  HCT 27.7* 28.5* 26.3*  MCV 89.9 94.4 92.6  PLT 244 214 241   Cardiac Enzymes: No results found for this basename: CKTOTAL, CKMB, CKMBINDEX, TROPONINI,  in the last 168 hours BNP: No components found with this basename: POCBNP,  CBG: No results found for this basename: GLUCAP,  in the last 168 hours  No results found for this or any previous visit (from the past 240  hour(s)).   Studies:              All Imaging reviewed and is as per above notation   Scheduled Meds: . amiodarone  100 mg Oral Daily  . antiseptic oral rinse  7 mL Mouth Rinse BID  . colchicine  0.3 mg Oral Daily  . oxybutynin  5 mg Oral QHS  . potassium chloride SA  20 mEq Oral Daily  . sodium chloride  3 mL Intravenous Q12H  . torsemide  80 mg Oral q morning - 10a   Continuous Infusions: . heparin 1,000 Units/hr (11/13/13 1630)     Assessment/Plan:  1. Acute lumbar L1-T12 fracture s/p mechanical fall in a setting of prior fusion-neurosurgery has recommended a back brace for comfort Plan is to undergo kyphoplasty at [facility] once INR is less than 1.7 2. Combined systolic diastolic heart failure-Dry weight recorded in system was 175 pounds. Her current weight appears to be above baseline, but this may not be accurate.  She is being followed by cardiology and has been seen by Heart failure team in past.  She has also required milrinone in the past as well. She [place] currently on her home dose of demadex and has received intermittent doses of intravenous lasix. Will check chest xray, since she is still on supplemental oxygen. Further adjustments of diuretics per cardiology. Will likely benefit from a heart failure team consult on arrival to [facility]. 3. History paroxysmal atrial fibrillation status post Maze procedure-continue amiodarone for chronic arrhythmia suppression 100 daily, she is chronically anticoagulated with coumadin which is currently on hold for procedure. She is receiving heparin bridge that should be discontinued 4 hours prior to procedure.   4. Hyperlipidemia-potential interaction between Zocor and amiodarone. Discontinue simvastatin in hospital. Resume Lipitor on outpatient  5. Cardiorenal syndrome, chronic kidney disease stage 3-creatinine appears to be trending up. Continue medications as above. Baseline creatinine appears to be around 2. May need adjustments of  diuretics 6. Normocytic anemia chronic disease/renal disease. Hemoglobin appears to be near baseline. No gross evidence of bleeding.  7. history of bacteremia strep viridans-resolved 8. History of gout, not in any specific distress. Continue colchicine 0.3 daily 9. Bladder incontinence continue oxybutynin 5 each bedtime  Code Status: DNR Family Communication: discussed with daughter at bedside Disposition Plan: Transfer to [facility] for further care, including kyphoplasty and heart failure consult. Discussed with Dr. Doyle Askew who has agreed to accept in transfer   Kathie Dike, MD  [facility] Pager [phone]  11/14/2013, 10:47 AM    LOS: 3 days

## 2013-11-14 NOTE — Progress Notes (Signed)
Patient is very lethargic. Daughter in the room and concerned. Dr Yvette Rack notified. Narcan was ordered however patient's respiratory rate was stable. Daughter decided to onto give the narcan due to pain level of patient. Daughter wants to let the pain medication wear off on its own. Md made aware.

## 2013-11-15 ENCOUNTER — Inpatient Hospital Stay (HOSPITAL_COMMUNITY): Payer: Medicare HMO

## 2013-11-15 ENCOUNTER — Ambulatory Visit: Payer: Commercial Managed Care - HMO | Admitting: Cardiology

## 2013-11-15 DIAGNOSIS — I5023 Acute on chronic systolic (congestive) heart failure: Secondary | ICD-10-CM

## 2013-11-15 LAB — BASIC METABOLIC PANEL
ANION GAP: 14 (ref 5–15)
BUN: 40 mg/dL — ABNORMAL HIGH (ref 6–23)
CO2: 27 meq/L (ref 19–32)
Calcium: 9.3 mg/dL (ref 8.4–10.5)
Chloride: 96 mEq/L (ref 96–112)
Creatinine, Ser: 2.42 mg/dL — ABNORMAL HIGH (ref 0.50–1.10)
GFR calc Af Amer: 22 mL/min — ABNORMAL LOW (ref >90)
GFR calc non Af Amer: 19 mL/min — ABNORMAL LOW (ref >90)
Glucose, Bld: 118 mg/dL — ABNORMAL HIGH (ref 70–99)
Potassium: 5 mEq/L (ref 3.7–5.3)
Sodium: 137 mEq/L (ref 137–147)

## 2013-11-15 LAB — HEPARIN LEVEL (UNFRACTIONATED)
Heparin Unfractionated: 0.16 IU/mL — ABNORMAL LOW (ref 0.30–0.70)
Heparin Unfractionated: 0.2 IU/mL — ABNORMAL LOW (ref 0.30–0.70)

## 2013-11-15 LAB — CBC
HEMATOCRIT: 26.4 % — AB (ref 36.0–46.0)
Hemoglobin: 8.3 g/dL — ABNORMAL LOW (ref 12.0–15.0)
MCH: 29.1 pg (ref 26.0–34.0)
MCHC: 31.4 g/dL (ref 30.0–36.0)
MCV: 92.6 fL (ref 78.0–100.0)
Platelets: 223 10*3/uL (ref 150–400)
RBC: 2.85 MIL/uL — ABNORMAL LOW (ref 3.87–5.11)
RDW: 17.7 % — AB (ref 11.5–15.5)
WBC: 5.8 10*3/uL (ref 4.0–10.5)

## 2013-11-15 LAB — PROTIME-INR
INR: 1.46 (ref 0.00–1.49)
PROTHROMBIN TIME: 17.7 s — AB (ref 11.6–15.2)

## 2013-11-15 MED ORDER — FUROSEMIDE 10 MG/ML IJ SOLN
80.0000 mg | Freq: Two times a day (BID) | INTRAMUSCULAR | Status: DC
  Administered 2013-11-15 – 2013-11-19 (×9): 80 mg via INTRAVENOUS
  Filled 2013-11-15 (×10): qty 8

## 2013-11-15 MED ORDER — DEXTROSE 5 % IV SOLN
1.0000 g | Freq: Every day | INTRAVENOUS | Status: DC
  Administered 2013-11-15 – 2013-11-17 (×3): 1 g via INTRAVENOUS
  Filled 2013-11-15 (×3): qty 10

## 2013-11-15 NOTE — Consult Note (Signed)
Reason for Consult: severe back pain  Chief Complaint: Back pain after fall at home 11/11/13 Chief Complaint  Patient presents with  . Fall    Referring Physician(s): [facility] Dr Coralyn Pear  History of Present Illness: Pt fell at home 11/11/13 Severe back pain since then Cannot ambulate; in bed now CT reveals acute fractures at Thoracic 12 and Lumbar 1 Imaging has been reviewed by Dr Estanislado Pandy I have seen and examined pt Now scheduled for T12 and L1 Vertebroplasty/kyphoplasty probable 9/14 Insurance still pending authorization New UTI now being treated with Rocephin- 1st dose 9/10 Need 3 day treatment before can safely perform procedure   [name] [name] is a 73 y.o. female  CHF; afib; MVR 05/16/13--off coumadin- INR 1.46 today---now on Heparin COPD; TIA; encephalopathy; Rh fever hx; HLD; HTN; CKD  Past Medical History  Diagnosis Date  . COPD (chronic obstructive pulmonary disease)   . History of TIA (transient ischemic attack)     Diagnosed 2003  . History of stroke     2000 [place]  . Obesity   . Juvenile rheumatic fever     Age 33  . Essential hypertension, benign   . Hyperlipidemia   . Mitral regurgitation     Status post bioprosthetic MVR  . CKD (chronic kidney disease) stage 4, GFR 15-29 ml/min   . Paroxysmal atrial fibrillation     First diagnosed December 2014  . Chronic diastolic congestive heart failure   . Arthritis   . Aortic insufficiency due to bicuspid aortic valve     Moderate by TEE  . 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 S/P Maze operation for atrial fibrillation 05/16/2013    Complete bilateral atrial lesion set using cryothermy and bipolar radiofrequency ablation with oversewing of LA appendage    Past Surgical History  Procedure Laterality Date  . Total knee arthroplasty Right   .  Abdominal hysterectomy      Cervical Cancer  . Parathyroid/thyroid surgery      Tumor  . Tee without cardioversion N/A 04/06/2013    Procedure: TRANSESOPHAGEAL ECHOCARDIOGRAM (TEE);  Surgeon: [name], MD;  [facility]: [facility];  Service: Cardiology;  Laterality: N/A;  . Minimally invasive maze procedure N/A 05/16/2013    Procedure: MINIMALLY INVASIVE MAZE PROCEDURE;  Surgeon: Rexene Alberts, MD;  Location: [facility];  Service: Open Heart Surgery;  Laterality: N/A;  . Intraoperative transesophageal echocardiogram N/A 05/16/2013    Procedure: INTRAOPERATIVE TRANSESOPHAGEAL ECHOCARDIOGRAM;  Surgeon: Rexene Alberts, MD;  Location: [facility];  Service: Open Heart Surgery;  Laterality: N/A;  . Mitral valve replacement Right 05/16/2013    Procedure: MINIMALLY INVASIVE MITRAL VALVE (MV) REPLACEMENT;  Surgeon: Rexene Alberts, MD;  Location: [facility];  Service: Open Heart Surgery;  Laterality: Right;  . Tee without cardioversion N/A 06/06/2013    Procedure: TRANSESOPHAGEAL ECHOCARDIOGRAM (TEE);  Surgeon: Larey Dresser, MD;  Location: [facility];  Service: Cardiovascular;  Laterality: N/A;  . Cardioversion N/A 06/06/2013    Procedure: CARDIOVERSION;  Surgeon: Larey Dresser, MD;  Location: [facility];  Service: Cardiovascular;  Laterality: N/A;    Allergies: Indomethacin and Norvasc  Medications: Prior to Admission medications   Medication Sig Start Date End Date Taking? Authorizing Provider  amiodarone (PACERONE) 100 MG tablet Take 1 tablet (100 mg total) by mouth daily. 10/11/13  Yes Elwyn Reach  Boyce Medici, NP  colchicine 0.6 MG tablet Take 0.5 tablets (0.3 mg total) by mouth daily. 10/11/13  Yes Rande Brunt, NP  oxybutynin (DITROPAN-XL) 5 MG 24 hr tablet Take 5 mg by mouth at bedtime.   Yes Historical Provider, MD  potassium chloride SA (K-DUR,KLOR-CON) 20 MEQ tablet Take 1 tablet (20 mEq total) by mouth daily. 05/30/13  Yes Donielle Liston Alba, PA-C  simvastatin (ZOCOR) 10 MG tablet Take 1 tablet (10  mg total) by mouth daily at 6 PM. 06/13/13  Yes Rhonda G Barrett, PA-C  torsemide (DEMADEX) 20 MG tablet Take 80 mg by mouth every morning.   Yes Historical Provider, MD  warfarin (COUMADIN) 2.5 MG tablet Take 1.25-2.5 mg by mouth See admin instructions. Takes one tablet (2.5mg  total) on all days except on Friday. Takes one-half tablet (1.25mg  total) on Fridays only   Yes Historical Provider, MD    Family History  Problem Relation Age of Onset  . Heart failure Father   . Heart attack Brother     History   Social History  . Marital Status: Widowed    Spouse Name: N/A    Number of Children: N/A  . Years of Education: N/A   Social History Main Topics  . Smoking status: Never Smoker   . Smokeless tobacco: Never Used  . Alcohol Use: No  . Drug Use: No  . Sexual Activity: None   Other Topics Concern  . 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  All other systems are negative.  Review of Systems  Constitutional: Positive for activity change and appetite change. Negative for fever and unexpected weight change.  Respiratory: Negative for apnea, cough and choking.   Cardiovascular: Negative for chest pain.  Gastrointestinal: Positive for abdominal pain.  Genitourinary: Positive for dysuria and difficulty urinating. Negative for flank pain.  Musculoskeletal: Positive for back pain and gait problem.  Neurological: Positive for headaches.  Psychiatric/Behavioral: Positive for confusion. Negative for agitation.    Vital Signs: BP 118/55  Pulse 74  Temp(Src) 98.9 F (37.2 C) (Oral)  Resp 18  Ht 5\' 2"  (1.575 m)  Wt 85.911 kg (189 lb 6.4 oz)  BMI 34.63 kg/m2  SpO2 96%  Physical Exam  Constitutional: She appears well-nourished.  Cardiovascular: Normal rate and normal heart sounds.   No  murmur heard. Irreg  Pulmonary/Chest: Effort normal. She has wheezes.  Abdominal: Soft. She exhibits no distension.  Musculoskeletal: Normal range of motion.  Moves all 4s Severe back pain  Neurological:  Pt groggy; confused with meds  Skin: Skin is warm. There is pallor.  Psychiatric:  Consented dtr in room    Imaging: Dg Thoracic Spine W/swimmers  11/11/2013   CLINICAL DATA:  Fall.  Pain.  EXAM: THORACIC SPINE - 2 VIEW + SWIMMERS  COMPARISON:  Chest radiographs 10/09/2013 and 10/08/2013. Chest CT 05/07/2013.  FINDINGS: There are 12 pairs of ribs. Upper thoracic dextroscoliosis is noted. No listhesis is seen. Vertebral body fusion is again seen at T12-L1 with the vertebral bodies appearing hypoplastic, likely congenital. There is new curvilinear lucency extending through the anterior cortex at L1 and to the inferior endplate, consistent with new fracture. There is also a fracture through an anterior osteophyte at T7-8 with questionable  extension through the T8 vertebral body, and this also appears new. Diffuse bridging osteophytosis is again seen throughout the visualized spine and may reflect DISH.  Cardiac silhouette is enlarged with evidence of prior mitral valve replacement. Surgical clips are noted in the mediastinum and lower left neck. Right PICC line has been removed. Right jugular sheath has also been removed. Ill defined right lower lobe infiltrate appears mildly improved.  IMPRESSION: 1. New L1 vertebral body fracture. 2. Fracture through an anterior osteophyte at T7-8 with questionable extension into the T8 vertebral body. Thoracic spine CT is recommended to further evaluate these findings. These results were called by telephone at the time of interpretation on 11/11/2013 at 8:18 pm to Dr. Riki Altes , who verbally acknowledged these results.   Electronically Signed   By: Logan Bores   On: 11/11/2013 20:19   Dg Lumbar Spine Complete  11/11/2013   CLINICAL DATA:  Fall.  Low back pain.   EXAM: LUMBAR SPINE - COMPLETE 4+ VIEW  COMPARISON:  08/12/2004  FINDINGS: No evidence of acute lumbar spine fracture or subluxation. 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 Mild degenerative disc disease is seen at all lumbar levels, without significant change. Lower lumbar facet DJD again seen bilaterally from levels of L3-S1. No focal lytic sclerotic bone lesions identified.  IMPRESSION: No acute findings.  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  Degenerative spondylosis, as described above.   Electronically Signed   By: Earle Gell M.D.   On: 11/11/2013 20:03   Dg Hip Complete Right  11/15/2013   CLINICAL DATA:  Abnormal right hip radiograph  EXAM: RIGHT HIP - COMPLETE 2+ VIEW  COMPARISON:  Portal right hip radiograph dated 11/14/2013 at 2118 hr  FINDINGS: No definite fracture or dislocation is seen.  Mild degenerative changes of the bilateral hips.  Moderate degenerative changes the lower lumbar spine.  Visualized bony pelvis appears intact.  Surgical clips overlying the right inguinal region.  IMPRESSION: No definite fracture or dislocation is seen.   Electronically Signed   By: Julian Hy M.D.   On: 11/15/2013 00:25   Ct Head Wo Contrast  11/11/2013   CLINICAL DATA:  Fall, posterior scalp hematoma, on Coumadin  EXAM: CT HEAD WITHOUT CONTRAST  CT CERVICAL SPINE WITHOUT CONTRAST  TECHNIQUE: Multidetector CT imaging of the head and cervical spine was performed following the standard protocol without intravenous contrast. Multiplanar CT image reconstructions of the cervical spine were also generated.  COMPARISON:  CT head dated 05/22/2013  FINDINGS: CT HEAD FINDINGS  No evidence of parenchymal hemorrhage or extra-axial fluid collection. No mass lesion, mass effect, or midline shift.  No CT evidence of acute infarction.  Subcortical white matter and periventricular small vessel ischemic changes. Intracranial atherosclerosis.  Bilateral basal ganglia lacunar  infarcts.  Mild global cortical atrophy.  No ventriculomegaly.  The visualized paranasal sinuses are essentially clear. The mastoid air cells are unopacified.  No evidence of calvarial fracture.  CT CERVICAL SPINE FINDINGS  Exaggerated cervical lordosis.  No evidence of fracture or dislocation. Vertebral body heights are maintained. Dens appears intact.  Moderate multilevel degenerative changes.  Status post left thyroidectomy.  Right thyroid is mildly nodular.  Visualized lung apices are essentially clear.  IMPRESSION: No evidence of acute intracranial abnormality. Atrophy with small vessel ischemic changes and intracranial atherosclerosis.  No evidence of traumatic injury to the cervical spine. Moderate multilevel degenerative changes.   Electronically Signed   By: Henderson Newcomer.D.  On: 11/11/2013 19:37   Ct Cervical Spine Wo Contrast  11/11/2013   CLINICAL DATA:  Fall, posterior scalp hematoma, on Coumadin  EXAM: CT HEAD WITHOUT CONTRAST  CT CERVICAL SPINE WITHOUT CONTRAST  TECHNIQUE: Multidetector CT imaging of the head and cervical spine was performed following the standard protocol without intravenous contrast. Multiplanar CT image reconstructions of the cervical spine were also generated.  COMPARISON:  CT head dated 05/22/2013  FINDINGS: CT HEAD FINDINGS  No evidence of parenchymal hemorrhage or extra-axial fluid collection. No mass lesion, mass effect, or midline shift.  No CT evidence of acute infarction.  Subcortical white matter and periventricular small vessel ischemic changes. Intracranial atherosclerosis.  Bilateral basal ganglia lacunar infarcts.  Mild global cortical atrophy.  No ventriculomegaly.  The visualized paranasal sinuses are essentially clear. The mastoid air cells are unopacified.  No evidence of calvarial fracture.  CT CERVICAL SPINE FINDINGS  Exaggerated cervical lordosis.  No evidence of fracture or dislocation. Vertebral body heights are maintained. Dens appears intact.   Moderate multilevel degenerative changes.  Status post left thyroidectomy.  Right thyroid is mildly nodular.  Visualized lung apices are essentially clear.  IMPRESSION: No evidence of acute intracranial abnormality. Atrophy with small vessel ischemic changes and intracranial atherosclerosis.  No evidence of traumatic injury to the cervical spine. Moderate multilevel degenerative changes.   Electronically Signed   By: Julian Hy M.D.   On: 11/11/2013 19:37   Ct Thoracic Spine Wo Contrast  11/11/2013   CLINICAL DATA:  Fall.  EXAM: CT THORACIC AND LUMBAR SPINE WITHOUT CONTRAST  TECHNIQUE: Multidetector CT imaging of the thoracic and lumbar spine was performed without contrast. Multiplanar CT image reconstructions were also generated.  COMPARISON:  05/07/2013 and 02/11/2011  FINDINGS: CT THORACIC SPINE FINDINGS  There is mild biphasic curvature of the thoracolumbar spine with primary curvature of the thoracic spine convex to the right and secondary curvature of the lumbar spine convex to the left unchanged. There is moderate spondylosis throughout the spine. There are bridging anterior osteophytes/ ossification of the anterior longitudinal ligament. There is mild compression deformity of T12 without significant change. There is decreased disc space at multiple levels of the thoracic spine unchanged.  There is 9 mm hypodensity over the right lobe of the thyroid. There are surgical clips adjacent the left thyroid which likely has been removed. There is calcified plaque over the thoracic aorta. There is evidence cardiomegaly with prosthetic mitral valve. There is patchy opacification over the mid to lower lungs as cannot exclude infection.6 2 cm right middle lobe rounded mass unchanged. 1.2 cm nodular density more anteriorly in the right middle lobe slightly more prominent. Atrophic left kidney unchanged. Possible mild cholelithiasis.  CT LUMBAR SPINE FINDINGS  Moderate spondylosis with disc space narrowing at all  levels with mild sparing of the L5-S1 level. Previously noted anterior wedging of L1 is again seen. Previously, there was evidence of partial fusion of T12 and L1 both demonstrating anterior wedging as there is now lucency through the anterior aspect of the previous vertebral body fusion with this lucency extending through the L1 vertebral body compatible with an acute fracture. No evidence of fracture fragments within the canal. No evidence of subluxation. Remainder of the exam is unchanged. Diverticulosis of the colon unchanged.  IMPRESSION: CT THORACIC SPINE IMPRESSION  Moderate spondylosis with multilevel disc disease and bridging anterior osteophyte suggesting DISH. Mild curvature convex to the right.  9 mm right thyroid nodule.  Previous left thyroidectomy.  Cardiomegaly.  Stable 2  cm right middle lobe nodule. 1.2 cm anterior right middle lobe nodule new or larger. Mild heterogeneous opacification within the mid to lower lungs as cannot exclude infection. Consider chest CT for further evaluation.  CT LUMBAR SPINE IMPRESSION  Acute fracture through the anterior aspect of the previously seen fusion of the T12 and L1 vertebral bodies at the level of the anterior disc space as this fracture extends obliquely through the L1 vertebral body. No fracture fragments within the canal. No subluxation.  Moderate spondylosis with multilevel disc disease.  Diverticulosis of the colon.  These results were called by telephone at the time of interpretation on 11/11/2013 at 9:33 pm to Dr. Riki Altes , who verbally acknowledged these results.   Electronically Signed   By: Marin Olp M.D.   On: 11/11/2013 21:33   Ct Lumbar Spine Wo Contrast  11/11/2013   CLINICAL DATA:  Fall.  EXAM: CT THORACIC AND LUMBAR SPINE WITHOUT CONTRAST  TECHNIQUE: Multidetector CT imaging of the thoracic and lumbar spine was performed without contrast. Multiplanar CT image reconstructions were also generated.  COMPARISON:  05/07/2013 and 02/11/2011   FINDINGS: CT THORACIC SPINE FINDINGS  There is mild biphasic curvature of the thoracolumbar spine with primary curvature of the thoracic spine convex to the right and secondary curvature of the lumbar spine convex to the left unchanged. There is moderate spondylosis throughout the spine. There are bridging anterior osteophytes/ ossification of the anterior longitudinal ligament. There is mild compression deformity of T12 without significant change. There is decreased disc space at multiple levels of the thoracic spine unchanged.  There is 9 mm hypodensity over the right lobe of the thyroid. There are surgical clips adjacent the left thyroid which likely has been removed. There is calcified plaque over the thoracic aorta. There is evidence cardiomegaly with prosthetic mitral valve. There is patchy opacification over the mid to lower lungs as cannot exclude infection.6 2 cm right middle lobe rounded mass unchanged. 1.2 cm nodular density more anteriorly in the right middle lobe slightly more prominent. Atrophic left kidney unchanged. Possible mild cholelithiasis.  CT LUMBAR SPINE FINDINGS  Moderate spondylosis with disc space narrowing at all levels with mild sparing of the L5-S1 level. Previously noted anterior wedging of L1 is again seen. Previously, there was evidence of partial fusion of T12 and L1 both demonstrating anterior wedging as there is now lucency through the anterior aspect of the previous vertebral body fusion with this lucency extending through the L1 vertebral body compatible with an acute fracture. No evidence of fracture fragments within the canal. No evidence of subluxation. Remainder of the exam is unchanged. Diverticulosis of the colon unchanged.  IMPRESSION: CT THORACIC SPINE IMPRESSION  Moderate spondylosis with multilevel disc disease and bridging anterior osteophyte suggesting DISH. Mild curvature convex to the right.  9 mm right thyroid nodule.  Previous left thyroidectomy.  Cardiomegaly.   Stable 2 cm right middle lobe nodule. 1.2 cm anterior right middle lobe nodule new or larger. Mild heterogeneous opacification within the mid to lower lungs as cannot exclude infection. Consider chest CT for further evaluation.  CT LUMBAR SPINE IMPRESSION  Acute fracture through the anterior aspect of the previously seen fusion of the T12 and L1 vertebral bodies at the level of the anterior disc space as this fracture extends obliquely through the L1 vertebral body. No fracture fragments within the canal. No subluxation.  Moderate spondylosis with multilevel disc disease.  Diverticulosis of the colon.  These results were called by telephone at  the time of interpretation on 11/11/2013 at 9:33 pm to Dr. Riki Altes , who verbally acknowledged these results.   Electronically Signed   By: Marin Olp M.D.   On: 11/11/2013 21:33   Dg Chest Port 1 View  11/14/2013   CLINICAL DATA:  Shortness of breath, back pain  EXAM: PORTABLE CHEST - 1 VIEW  COMPARISON:  10/09/2013  FINDINGS: Significant enlarged of the cardiac silhouette stable. Replacement cardiac valve stable. Increased hazy opacity throughout the right lung. Bilateral vascular congestion. Interstitial change seen bilaterally with peribronchial cuffing.  IMPRESSION: Findings suggest congestive heart failure. There is asymmetric opacity with much greater opacity on the right than the left. This may be due to asymmetric edema. Superimposed pneumonia/pneumonitis not excluded.   Electronically Signed   By: Skipper Cliche M.D.   On: 11/14/2013 15:18   Dg Hip Portable 1 View Right  11/14/2013   CLINICAL DATA:  Persistent right hip pain after a fall on Sunday.  EXAM: PORTABLE RIGHT HIP - 1 VIEW  COMPARISON:  None.  FINDINGS: Degenerative changes in the right hip. There is some irregularity of the femoral neck with linear lucency across the base of the femoral head. Nondisplaced fracture is not excluded on single portable view. Recommend obtaining dedicated hip series  for further evaluation. Surgical clips in the soft tissues.  IMPRESSION: Linear lucency across the base of the right femoral neck may represent nondisplaced fracture. Suggest additional views for correlation   Electronically Signed   By: Lucienne Capers M.D.   On: 11/14/2013 21:35    Labs: Lab Results  Component Value Date   WBC 5.8 11/15/2013   HCT 26.4* 11/15/2013   MCV 92.6 11/15/2013   PLT 223 11/15/2013   NA 137 11/15/2013   K 5.0 11/15/2013   CL 96 11/15/2013   CO2 27 11/15/2013   GLUCOSE 118* 11/15/2013   BUN 40* 11/15/2013   CREATININE 2.42* 11/15/2013   CALCIUM 9.3 11/15/2013   PROT 7.2 11/13/2013   ALBUMIN 3.2* 11/14/2013   AST 24 11/13/2013   ALT 11 11/13/2013   ALKPHOS 92 11/13/2013   BILITOT 0.5 11/13/2013   GFRNONAA 19* 11/15/2013   GFRAA 22* 11/15/2013   INR 1.46 11/15/2013    Assessment and Plan: Severe back pain secondary Fx T12 ans L1 Now scheduled for VP/KP 9/14 in IR Still awaiting Ins approval Also must be treated 3 days with antibx for ongoing UTI (tx started 9/11) Pts family aware of procedure benefits and risks and agreeable to proceed Consent signed andin chart IR PA will write orders for 9/14 procedure   I spent a total of 20 minutes face to face in clinical consultation, greater than 50% of which was counseling/coordinating care for T12 and L1 KP/VP  Thank you for this interesting consult.  I greatly enjoyed meeting MARALEE HIGUCHI and look forward to participating in their care.   Lavonia Drafts PAC-IR [phone]

## 2013-11-15 NOTE — Progress Notes (Signed)
PHARMACY NOTE  Pharmacy Consult :  73 y.o. female is currently on Heparin bridging for atrial fibrillation .   Dosing Wt :  70 kg  Hematology :  Recent Labs  11/12/13 2245 11/13/13 0536 11/13/13 1536 11/13/13 2218 11/14/13 0547 11/15/13 0735 11/15/13 1921  HGB  --   --   --   --  8.0* 8.3*  --   HCT  --   --   --   --  26.3* 26.4*  --   PLT  --   --   --   --  241 223  --   LABPROT 24.4* 23.6* 22.2*  --  22.3* 17.7*  --   INR 2.20* 2.10* 1.95*  --  1.96* 1.46  --   HEPARINUNFRC  --   --   --  0.30 0.46 0.16* 0.20*  CREATININE  --  2.59*  --   --  2.82*  2.83* 2.42*  --     Current Medication[s] Include: Infusion[s]: Infusions:  . heparin 1,250 Units/hr (11/15/13 0900)    Assessment :  Heparin level reported as 0.2 units/ml.  This is below the therapeutic goal.  No evidence of bleeding complications observed.  Goal :  Heparin level 0.3-0.7 units/ml.  Plan : 1. Heparin will be increased to 1500  Units/hr.   The next Heparin Level will be drawn with the AM labs. 2. Daily Heparin level, INR, CBC, and Monitor for bleeding complications.  Follow [place] counts.  Raahil Ong, Craig Guess,  Pharm.D  11/15/2013  8:15 PM

## 2013-11-15 NOTE — Clinical Documentation Improvement (Addendum)
  Attending Hospitalist,  Please clarify if the diagnosis of "Acute Respiratory Failure with Hypoxia" is applicable to this admission or does it pertain to a previous hospital admission.  The diagnosis Acute Respiratory Failure with Hypoxia is currently documented in the "Active Problems" section at the bottom of the progress notes starting 11/15/13.  If the diagnosis is applicable to this admission, please include clinical signs and symptoms for the Acute Respiratory Failure with Hypoxia".  Thank You, Erling Conte ,RN Clinical Documentation Specialist:  [phone] [facility]

## 2013-11-15 NOTE — Progress Notes (Signed)
Orthopedic Tech Progress Note Patient Details:  [name] [name] Apr 14, 1940 [id number] Patient has own brace. [name], female   DOB: 1940/10/03, 73 y.o.   MRN: [id number]   Braulio Bosch 11/15/2013, 9:51 PM

## 2013-11-15 NOTE — Progress Notes (Signed)
Orthopedic Tech Progress Note Patient Details:  [name] 1940/10/07 [id number] Called [employer] for brace order. [name], female   DOB: 18-Sep-1940, 73 y.o.   MRN: [id number]   Braulio Bosch 11/15/2013, 3:58 PM

## 2013-11-15 NOTE — Progress Notes (Addendum)
Primary cardiologist: Dr. Carlyle Dolly Advanced CHF: Dr. [name] Consulting cardiologist: Dr. Satira Sark  Subjective:   [name] [name] is a 73 yo obese white female with known history of rheumatic fever during childhood and long standing history of chronic diastolic congestive heart failure. She also has a history of CKD, HTN , CVA 2003,, and PAF. Recently treated for Strep viridans bacteremia: no vegetation seen on TTE that required 10 day course of antibiotics.   Progressed to severe symptomatic mitral regurgitation with paroxysmal atrial fibrillation and underwent minimally invasive Maze procedure with MV replacement (05/16/13).   Initially admitted to [facility] after fall. Fracture at the T12-L1 region through an area of prior fusion. Neurosurgery recommended a back brace . Neuro Interventional radiology was consulted for kypho-plasty/Vertebro-plasty.   Yesterday she was transferred from [facility] to [facility]. She received IV lasix + torsemide.   Confused this am. Denies SOB.     Creatinine 2.8> 2.4 Objective:   Temp:  [98.7 F (37.1 C)-99.7 F (37.6 C)] 98.9 F (37.2 C) (09/10 0637) Pulse Rate:  [65-74] 74 (09/10 0637) Resp:  [18] 18 (09/10 0637) BP: (117-121)/(40-55) 118/55 mmHg (09/10 0637) SpO2:  [96 %-100 %] 96 % (09/10 0637) Weight:  [189 lb 6.4 oz (85.911 kg)-190 lb 0.6 oz (86.2 kg)] 189 lb 6.4 oz (85.911 kg) (09/10 0637) Last BM Date: 11/11/13  Filed Weights   11/14/13 0854 11/14/13 1457 11/15/13 0637  Weight: 190 lb 7.6 oz (86.4 kg) 190 lb 0.6 oz (86.2 kg) 189 lb 6.4 oz (85.911 kg)    Intake/Output Summary (Last 24 hours) at 11/15/13 0946 Last data filed at 11/15/13 2952  Gross per 24 hour  Intake      0 ml  Output    375 ml  Net   -375 ml   PHYSICAL EXAM:  General: NAD. No resp difficulty. Lying flat in bed. Marland Kitchen  HEENT: normal  Neck: supple. JVD ~10 . Carotids 2+ bilaterally; no bruits. No lymphadenopathy or thryomegaly appreciated.  Cor: PMI normal. Irregular  rate & rhythm. No rubs, gallops or murmurs.  Lungs: Slight crackles at bases bilaterally.  Abdomen: soft, nontender, nondistended. No hepatosplenomegaly. No bruits or masses. Good bowel sounds.  Extremities: no cyanosis, clubbing, rash,  Rand LLE 2+ edema.   Neuro: Alert to self. Pleasantly confused.  Moves all 4 extremities w/o difficulty. Affect pleasant. GU: Foley in place.    Lab Results:  Basic Metabolic Panel:  Recent Labs Lab 11/13/13 0536 11/14/13 0547 11/15/13 0735  NA 139 139  139 137  K 4.2 4.7  4.7 5.0  CL 99 97  97 96  CO2 31 29  30 27   GLUCOSE 106* 105*  104* 118*  BUN 26* 34*  34* 40*  CREATININE 2.59* 2.82*  2.83* 2.42*  CALCIUM 9.5 9.4  9.4 9.3  MG 2.2  --   --     Liver Function Tests:  Recent Labs Lab 11/13/13 0536 11/14/13 0547  AST 24  --   ALT 11  --   ALKPHOS 92  --   BILITOT 0.5  --   PROT 7.2  --   ALBUMIN 3.1* 3.2*    CBC:  Recent Labs Lab 11/12/13 0527 11/14/13 0547 11/15/13 0735  WBC 5.4 8.0 5.8  HGB 8.3* 8.0* 8.3*  HCT 28.5* 26.3* 26.4*  MCV 94.4 92.6 92.6  PLT 214 241 223    Coagulation:  Recent Labs Lab 11/13/13 1536 11/14/13 0547 11/15/13 0735  INR 1.95* 1.96*  1.46     Medications:   Scheduled Medications: . amiodarone  100 mg Oral Daily  . antiseptic oral rinse  7 mL Mouth Rinse BID  . cefTRIAXone (ROCEPHIN)  IV  1 g Intravenous Daily  . colchicine  0.3 mg Oral Daily  . oxybutynin  5 mg Oral QHS  . sodium chloride  3 mL Intravenous Q12H  . torsemide  80 mg Oral q morning - 10a    Infusions: . heparin 1,250 Units/hr (11/15/13 0900)    PRN Medications: sodium chloride, HYDROcodone-acetaminophen, morphine injection, naLOXone (NARCAN)  injection, ondansetron (ZOFRAN) IV, ondansetron, sodium chloride   Assessment/Plan   1. Chronic primarily diastolic HF with history of prominent RV failure requiring milrinone in the past. Most recent evaluation with LVEF 09/2013 EF 50-55%.  Volume status  elevated. Give 80 mg IV lasix bid.  Stop torsemide. Weight up 10-15 pounds from baseline. 2. Paroxysmal atrial fibrillation: Status post prior cardioversion and more recently Maze procedure. Maintaining NSR. On amiodarone 100 mg daily. Heparin bridge.  INR 1.46 ---> pharmacy following 3. CKD stage 3-4, creatinine baseline 2.5 to 2.8. Creatinine 2.4  4. History of mitral regurgitation status post bioprosthetic MVR, stable by recent echocardiogram.  5. Moderate aortic regurgitation. 6. T12-L1 fracture in region of prior fusion, status post mechanical fall without syncope. Coumadin held, FFP given in anticipation of kypho-plasty/Vertebro-plasty. INR is 1.4 today. She is on heparin bridge per pharmacy. 7. UTI- on rocephin 8. Delirium - UTI, pain medications  CLEGG,AMY NP-C  9:47 AM  Patient seen with NP, agree with the above note.  She is volume overloaded after getting blood products at [facility]. She remain in NSR.  She has a lot of pain from her vertebral fracture.  Creatinine stable.  INR now 1.4, on heparin bridge.   - Lasix 80 mg IV every 8 hrs, follow creatinine and I/Os.  - Plan for kyphoplasty, hopefully today.  - Delirium likely is due to UTI and pain meds.  Treating UTI, hopefully pain med requirement will decrease.  - She will need rehab again at discharge.   [name] 11/15/2013 10:29 AM

## 2013-11-15 NOTE — Progress Notes (Signed)
Patient is currently active with [facility] for chronic disease management services.  Patient has been engaged by a [employer]. Spoke with patient's daughter at bedside who is concern about her mom not having any SNF days left for rehabilitation.  Will collaborate with [facility] and inpatient SW regarding options.  Patient will receive a post discharge transition of care call and will be evaluated for monthly home visits for assessments and disease process education.  Made Inpatient Case Manager aware that [facility] following. Of note, [facility] Care Management services does not replace or interfere with any services that are arranged by inpatient case management or social work.  For additional questions or referrals please contact Corliss Blacker BSN RN [facility] Liaison at [phone].

## 2013-11-15 NOTE — Progress Notes (Signed)
Foley catheter inserted.

## 2013-11-15 NOTE — Progress Notes (Signed)
[facility] PROGRESS NOTE  IDALIZ TINKLE KDT:[id number] DOB: [birth year] DOA: 11/11/2013 PCP: [name], MD  Assessment/Plan: 1. T12/L1 fracture -Patient having a fall in the community, resulted in severe back pain. A CT scan of lumbar thoracic spine performed on 11/11/2013 revealed an acute fracture through the anterior aspect of the T12 and L1 vertebral bodies. -She was transferred to [facility] for evaluation by interventional radiology regarding kyphoplasty. -INR was reversed, trending down to 1.46 on a.m. lab work. -Continue supportive care, narcotic analgesics for pain management  2. History of chronic combined systolic and diastolic congestive heart failure -Patient overloaded, was started on Lasix 80 mg IV twice a day. -Cardiology following  3.  History of paroxysmal atrial fibrillation -Patient undergoing maze procedure in past. She is maintained on amiodarone 100 mg daily -Currently rate controlled  4. Urinary tract infection -UA showing presence of bacteria, leukocyte esterase and nitrates. -She was started on ceftriaxone 1 g IV every 24 hours  5. Acute encephalopathy -Patient agitated, increasing confused, disoriented -I suspect encephalopathy secondary to multiple factors including underlying infectious process, pain, hospitalization, and narcotic analgesics -Continue to provide supportive care -Will need physical therapy consultation and probable SNF placement  Code Status: DO NOT RESUSCITATE Family Communication: Spoke to her daughter present at bedside Disposition Plan: Interventional radiology consultation   Consultants:  Cardiology  Interventional radiology   Antibiotics:  Ceftriaxone 1 g IV every 24 hours  HPI/Subjective: Patient is a pleasant 73 year old female having multiple comorbidities including combined systolic and diastolic congestive heart failure, having a fall in clusters with intense back pain. She was initially  admitted to [facility], found to have a T12/L1 fracture. She had been on anticoagulation in the outpatient setting, given vitamin K with INR now trending down to 1.46 on a.m. lab work. She was transferred to [facility] to undergo kyphoplasty by interventional radiology.  Objective: Filed Vitals:   11/15/13 0637  BP: 118/55  Pulse: 74  Temp: 98.9 F (37.2 C)  Resp: 18    Intake/Output Summary (Last 24 hours) at 11/15/13 1213 Last data filed at 11/15/13 9983  Gross per 24 hour  Intake      0 ml  Output    375 ml  Net   -375 ml   Filed Weights   11/14/13 0854 11/14/13 1457 11/15/13 0637  Weight: 86.4 kg (190 lb 7.6 oz) 86.2 kg (190 lb 0.6 oz) 85.911 kg (189 lb 6.4 oz)    Exam:   General:  Patient is awake, confused, disoriented, having difficulties following commands  Cardiovascular: Regular rate and rhythm normal S1-S2  Respiratory: Normal respiratory effort, have a few by basilar crackles, on supplemental oxygen  Abdomen: Soft nontender nondistended  Musculoskeletal: Has 1+ bilateral extremity pitting edema  Data Reviewed: Basic Metabolic Panel:  Recent Labs Lab 11/11/13 1831 11/12/13 0527 11/13/13 0536 11/14/13 0547 11/15/13 0735  NA 141 144 139 139  139 137  K 3.6* 4.0 4.2 4.7  4.7 5.0  CL 100 102 99 97  97 96  CO2 30 31 31 29  30 27   GLUCOSE 124* 140* 106* 105*  104* 118*  BUN 21 22 26* 34*  34* 40*  CREATININE 1.97* 1.97* 2.59* 2.82*  2.83* 2.42*  CALCIUM 9.1 9.1 9.5 9.4  9.4 9.3  MG  --   --  2.2  --   --   PHOS  --   --   --  5.5*  --  Liver Function Tests:  Recent Labs Lab 11/13/13 0536 11/14/13 0547  AST 24  --   ALT 11  --   ALKPHOS 92  --   BILITOT 0.5  --   PROT 7.2  --   ALBUMIN 3.1* 3.2*   No results found for this basename: LIPASE, AMYLASE,  in the last 168 hours No results found for this basename: AMMONIA,  in the last 168 hours CBC:  Recent Labs Lab 11/11/13 1831 11/12/13 0527 11/14/13 0547  11/15/13 0735  WBC 5.4 5.4 8.0 5.8  NEUTROABS 3.2  --  5.6  --   HGB 8.7* 8.3* 8.0* 8.3*  HCT 27.7* 28.5* 26.3* 26.4*  MCV 89.9 94.4 92.6 92.6  PLT 244 214 241 223   Cardiac Enzymes: No results found for this basename: CKTOTAL, CKMB, CKMBINDEX, TROPONINI,  in the last 168 hours BNP (last 3 results)  Recent Labs  08/28/13 1418 10/02/13 1134 10/23/13 0806  PROBNP 1518.0* 2909.0* 1867.0*   CBG: No results found for this basename: GLUCAP,  in the last 168 hours  No results found for this or any previous visit (from the past 240 hour(s)).   Studies: Dg Hip Complete Right  11/15/2013   CLINICAL DATA:  Abnormal right hip radiograph  EXAM: RIGHT HIP - COMPLETE 2+ VIEW  COMPARISON:  Portal right hip radiograph dated 11/14/2013 at 2118 hr  FINDINGS: No definite fracture or dislocation is seen.  Mild degenerative changes of the bilateral hips.  Moderate degenerative changes the lower lumbar spine.  Visualized bony pelvis appears intact.  Surgical clips overlying the right inguinal region.  IMPRESSION: No definite fracture or dislocation is seen.   Electronically Signed   By: Julian Hy M.D.   On: 11/15/2013 00:25   Dg Chest Port 1 View  11/14/2013   CLINICAL DATA:  Shortness of breath, back pain  EXAM: PORTABLE CHEST - 1 VIEW  COMPARISON:  10/09/2013  FINDINGS: Significant enlarged of the cardiac silhouette stable. Replacement cardiac valve stable. Increased hazy opacity throughout the right lung. Bilateral vascular congestion. Interstitial change seen bilaterally with peribronchial cuffing.  IMPRESSION: Findings suggest congestive heart failure. There is asymmetric opacity with much greater opacity on the right than the left. This may be due to asymmetric edema. Superimposed pneumonia/pneumonitis not excluded.   Electronically Signed   By: Skipper Cliche M.D.   On: 11/14/2013 15:18   Dg Hip Portable 1 View Right  11/14/2013   CLINICAL DATA:  Persistent right hip pain after a fall on  Sunday.  EXAM: PORTABLE RIGHT HIP - 1 VIEW  COMPARISON:  None.  FINDINGS: Degenerative changes in the right hip. There is some irregularity of the femoral neck with linear lucency across the base of the femoral head. Nondisplaced fracture is not excluded on single portable view. Recommend obtaining dedicated hip series for further evaluation. Surgical clips in the soft tissues.  IMPRESSION: Linear lucency across the base of the right femoral neck may represent nondisplaced fracture. Suggest additional views for correlation   Electronically Signed   By: Lucienne Capers M.D.   On: 11/14/2013 21:35    Scheduled Meds: . amiodarone  100 mg Oral Daily  . antiseptic oral rinse  7 mL Mouth Rinse BID  . cefTRIAXone (ROCEPHIN)  IV  1 g Intravenous Daily  . colchicine  0.3 mg Oral Daily  . furosemide  80 mg Intravenous BID  . oxybutynin  5 mg Oral QHS  . sodium chloride  3 mL Intravenous Q12H  Continuous Infusions: . heparin 1,250 Units/hr (11/15/13 0900)    Principal Problem:   Lumbar vertebral fracture Active Problems:   Juvenile rheumatic fever   Chronic kidney disease   Paroxysmal atrial fibrillation   S/P MVR (mitral valve replacement)   Chronic systolic heart failure   Long term (current) use of anticoagulants   Anemia in chronic kidney disease(285.21)   DNR (do not resuscitate)   Fall at home   Fx lumbar vertebra-closed   Acute respiratory failure with hypoxia    Time spent: 35 minutes    Kelvin Cellar  [facility] Pager [phone]. If 7PM-7AM, please contact night-coverage at [email or web address], password [facility] 11/15/2013, 12:13 PM  LOS: 4 days

## 2013-11-15 NOTE — Progress Notes (Signed)
PT Cancellation Note  Patient Details Name: [name] [name] MRN: [id number] DOB: 03-Jun-1940   Cancelled Treatment:    Reason Eval/Treat Not Completed: Other (comment);Pain limiting ability to participate (pt's family requested no PT today due to severe pain with movement, noted kyphoplasty planned for today. Will attempt PT tomorrow. )   Philomena Doheny 11/15/2013, 11:51 AM [phone]

## 2013-11-15 NOTE — Progress Notes (Signed)
[facility]  Patient Status: Active (receiving services up to time of hospitalization)  [facility] is providing the following services: RN, PT and OT  If patient discharges after hours, please call [phone].   [name] [name] 11/15/2013, 5:10 PM

## 2013-11-15 NOTE — Clinical Documentation Improvement (Addendum)
  Dr. Aundra Dubin,  "Chronic Diastolic Heart Failure" documented in current medical record.  Patient is currently being treated with Lasix 80 mg IV bid for volume overload after getting blood products (FFP) at [facility].  If clinically appropriate, please clarify the ACUITY of the patient's diastolic heart failure during this admission.   - Acute  - Acute on Chronic  - Chronic as documented  Thank You, Erling Conte ,RN Clinical Documentation Specialist:  [facility]

## 2013-11-15 NOTE — Progress Notes (Signed)
[facility] for Heparin Indication: Afib  Allergies  Allergen Reactions  . Indomethacin Other (See Comments)    dizziness  . Norvasc [Amlodipine Besylate] Cough   Patient Measurements: Height: 5\' 2"  (157.5 cm) Weight: 189 lb 6.4 oz (85.911 kg) (bed scale) IBW/kg (Calculated) : 50.1 Heparin Dosing Weight: 70 Kg  Vital Signs: Temp: 98.9 F (37.2 C) (09/10 0637) Temp src: Oral (09/10 0637) BP: 118/55 mmHg (09/10 0637) Pulse Rate: 74 (09/10 0637)  Labs:  Recent Labs  11/13/13 0536 11/13/13 1536 11/13/13 2218 11/14/13 0547 11/15/13 0735  HGB  --   --   --  8.0* 8.3*  HCT  --   --   --  26.3* 26.4*  PLT  --   --   --  241 223  LABPROT 23.6* 22.2*  --  22.3* 17.7*  INR 2.10* 1.95*  --  1.96* 1.46  HEPARINUNFRC  --   --  0.30 0.46 0.16*  CREATININE 2.59*  --   --  2.82*  2.83*  --    Estimated Creatinine Clearance: 18.3 ml/min (by C-G formula based on Cr of 2.82).  Medical History: Past Medical History  Diagnosis Date  . COPD (chronic obstructive pulmonary disease)   . History of TIA (transient ischemic attack)     Diagnosed 2003  . History of stroke     [address] [place]  . Obesity   . Juvenile rheumatic fever     Age 28  . Essential hypertension, benign   . Hyperlipidemia   . Mitral regurgitation     Status post bioprosthetic MVR  . CKD (chronic kidney disease) stage 4, GFR 15-29 ml/min   . Paroxysmal atrial fibrillation     First diagnosed December 2014  . Chronic diastolic congestive heart failure   . Arthritis   . Aortic insufficiency due to bicuspid aortic valve     Moderate by TEE  . 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 S/P Maze operation for atrial fibrillation 05/16/2013    Complete bilateral atrial lesion set using cryothermy and bipolar radiofrequency ablation  with oversewing of LA appendage   Assessment: 73yo female on chronic Coumadin PTA for h/o valve replacement and afib.  Pt had accidental fall with fracture at T12 - L-1 region.  She is awaiting insurance approval for kyphoplasty/vertebroplasty.   INR was therapeutic on admission.  This has been reversed with Vit K 10mg  SQ & 5mg  PO on 9/8 + 5 units FFP.   Heparin bridge while INR<2.   Heparin level is now SUBtherapeutic at 0.16.  H/H remains low but stable, plt wnl and stable with no bleeding noted.   Goal of Therapy:  INR 2-3 Monitor platelets by anticoagulation protocol: Yes   Plan: - Increase Heparin to 1250 units/hr - F/u HL in 8 hours - Daily heparin level & CBC while on Heparin - F/U plans for surgery--> Stop Heparin ~ 4 hours prior to surgery per MD  Harolyn Rutherford, PharmD Clinical Pharmacist - Resident Pager: [phone] Pharmacy: [phone] 11/15/2013 8:47 AM

## 2013-11-16 ENCOUNTER — Inpatient Hospital Stay (HOSPITAL_COMMUNITY): Payer: Medicare HMO

## 2013-11-16 DIAGNOSIS — Z954 Presence of other heart-valve replacement: Secondary | ICD-10-CM

## 2013-11-16 DIAGNOSIS — I5033 Acute on chronic diastolic (congestive) heart failure: Secondary | ICD-10-CM

## 2013-11-16 LAB — BASIC METABOLIC PANEL
Anion gap: 14 (ref 5–15)
BUN: 42 mg/dL — AB (ref 6–23)
CHLORIDE: 99 meq/L (ref 96–112)
CO2: 30 meq/L (ref 19–32)
Calcium: 9.6 mg/dL (ref 8.4–10.5)
Creatinine, Ser: 1.99 mg/dL — ABNORMAL HIGH (ref 0.50–1.10)
GFR calc Af Amer: 28 mL/min — ABNORMAL LOW (ref >90)
GFR calc non Af Amer: 24 mL/min — ABNORMAL LOW (ref >90)
GLUCOSE: 90 mg/dL (ref 70–99)
POTASSIUM: 4.4 meq/L (ref 3.7–5.3)
SODIUM: 143 meq/L (ref 137–147)

## 2013-11-16 LAB — HEPARIN LEVEL (UNFRACTIONATED): Heparin Unfractionated: 0.41 IU/mL (ref 0.30–0.70)

## 2013-11-16 LAB — CBC
HCT: 24.7 % — ABNORMAL LOW (ref 36.0–46.0)
Hemoglobin: 7.5 g/dL — ABNORMAL LOW (ref 12.0–15.0)
MCH: 27.2 pg (ref 26.0–34.0)
MCHC: 30.4 g/dL (ref 30.0–36.0)
MCV: 89.5 fL (ref 78.0–100.0)
Platelets: 262 10*3/uL (ref 150–400)
RBC: 2.76 MIL/uL — ABNORMAL LOW (ref 3.87–5.11)
RDW: 17.8 % — ABNORMAL HIGH (ref 11.5–15.5)
WBC: 5.6 10*3/uL (ref 4.0–10.5)

## 2013-11-16 MED ORDER — FENTANYL CITRATE 0.05 MG/ML IJ SOLN
INTRAMUSCULAR | Status: AC
  Filled 2013-11-16: qty 4

## 2013-11-16 MED ORDER — MIDAZOLAM HCL 2 MG/2ML IJ SOLN
INTRAMUSCULAR | Status: AC
  Filled 2013-11-16: qty 4

## 2013-11-16 MED ORDER — HEPARIN (PORCINE) IN NACL 100-0.45 UNIT/ML-% IJ SOLN
1500.0000 [IU]/h | INTRAMUSCULAR | Status: DC
  Administered 2013-11-17 – 2013-11-18 (×3): 1500 [IU]/h via INTRAVENOUS
  Filled 2013-11-16 (×5): qty 250

## 2013-11-16 MED ORDER — FENTANYL CITRATE 0.05 MG/ML IJ SOLN
INTRAMUSCULAR | Status: AC | PRN
  Administered 2013-11-16 (×2): 12.5 ug via INTRAVENOUS

## 2013-11-16 MED ORDER — CEFAZOLIN SODIUM-DEXTROSE 2-3 GM-% IV SOLR
INTRAVENOUS | Status: AC
  Filled 2013-11-16: qty 50

## 2013-11-16 MED ORDER — HYDROMORPHONE HCL PF 1 MG/ML IJ SOLN
INTRAMUSCULAR | Status: AC
  Filled 2013-11-16: qty 2

## 2013-11-16 MED ORDER — GELATIN ABSORBABLE 12-7 MM EX MISC
CUTANEOUS | Status: AC
  Administered 2013-11-16: 14:00:00
  Filled 2013-11-16: qty 1

## 2013-11-16 MED ORDER — MIDAZOLAM HCL 2 MG/2ML IJ SOLN
INTRAMUSCULAR | Status: AC | PRN
  Administered 2013-11-16 (×2): 0.5 mg via INTRAVENOUS

## 2013-11-16 MED ORDER — IOHEXOL 300 MG/ML  SOLN
50.0000 mL | Freq: Once | INTRAMUSCULAR | Status: AC | PRN
Start: 2013-11-16 — End: 2013-11-16
  Administered 2013-11-16: 10 mL

## 2013-11-16 MED ORDER — SODIUM CHLORIDE 0.9 % IV SOLN
INTRAVENOUS | Status: AC
  Administered 2013-11-16: 17:00:00 via INTRAVENOUS

## 2013-11-16 MED ORDER — BUPIVACAINE HCL (PF) 0.25 % IJ SOLN
INTRAMUSCULAR | Status: AC
  Filled 2013-11-16: qty 30

## 2013-11-16 NOTE — Progress Notes (Signed)
Event: Notified by RN that family concerned that pt has had increasingly slurred speech and a (L) facial droop since approx mid-day. RN confirms that pt does appear to have a slight (L) facial droop and speech that is not as clear as it has been over last 1-2 days. RN attempted neuro assessment but pt inconsistent w/ her ability or willing ness to follow commands. NP to bedside. Subjective: Pt unable to provide information given persistent confusion. Objective: [name] [name] is a 73 y/o ? w/ h/o rheumatic fever , CVA 6237 combined systolic diastolic heart failure-s/p Maze procedure PAF as well as mitral valve replacement 05/16/13, sinus bradycardia, SOB, strep viridans in blood culture in past, anticoagulation with Coumadin, CKD stage III, anemia of chronic kidney disease admitted 11/11/13 w/ T12/L1 fx s/p accidental fall while moving with a walker and falling backwards. Her hospitalization has been complicated by active UTI, Chronic combined systolic/diastolic CHF, PAF and acute encephalopathy. At bedside pt noted lying in NAD. She is alert but confused. C/o pain with any attempt to move or reposition her. Skin w/d, color wnl. She does appear to have a slight (L) facial droop but lying w/ face turned to left and is resistant to allowing me to reposition her head in a srarightforward position. PEAR[employer]. Pt unable or unwilling to follow commands, simply states I can't do that. Speech is difficult to understand. Pt is afebrile. Remaining VSS. Assessment/Plan: 1. (L) facial droop/slurred speech (in setting of encephalopathic pt).  Unable to perform adequate neurological exam. (L) facial droop may be positional. Speech may be related to persistent/worsening encephalopathy. However, RN and family substantiate that these are changes for pt. Will obtain Ct head w/o CM though I suspect this will be w/o acute findings. Will continue to monitor closely on telemetry.  Jeryl Columbia, NP-C [facility] Pager  [phone]

## 2013-11-16 NOTE — Sedation Documentation (Signed)
When pt placed prone on IR procedure table, episode of epistaxis noted from R nare, Dr. D aware, bleeding has stopped.

## 2013-11-16 NOTE — Progress Notes (Signed)
PHARMACY NOTE  Pharmacy Consult :  73 y.o. female is to be restarted on Heparin infusion for atrial fibrillation.  She is s/p vertebroplasty.  Neuro Interventionalist indicated time Heparin to be restarted.   Heparin Dosing Wt :  70 kg  Hematology :  Recent Labs  11/13/13 2218 11/14/13 0547 11/15/13 0735 11/15/13 1921 11/16/13 0400  HGB  --  8.0* 8.3*  --  7.5*  HCT  --  26.3* 26.4*  --  24.7*  PLT  --  241 223  --  262  LABPROT  --  22.3* 17.7*  --   --   INR  --  1.96* 1.46  --   --   HEPARINUNFRC 0.30 0.46 0.16* 0.20* 0.41  CREATININE  --  2.82*  2.83* 2.42*  --  1.99*    Current Medication[s] Include: Scheduled:  Scheduled:  . amiodarone  100 mg Oral Daily  . antiseptic oral rinse  7 mL Mouth Rinse BID  . bupivacaine (PF)      . ceFAZolin      . cefTRIAXone (ROCEPHIN)  IV  1 g Intravenous Daily  . colchicine  0.3 mg Oral Daily  . fentaNYL      . furosemide  80 mg Intravenous BID  . midazolam      . oxybutynin  5 mg Oral QHS  . sodium chloride  3 mL Intravenous Q12H   Infusion[s]: Infusions:  . [START ON 11/17/2013 @ 00:01AM ] heparin  Begin, NO BOLUS, at 1500 units/hr   Antibiotic[s]: Anti-infectives   Start     Dose/Rate Route Frequency Ordered Stop   11/16/13 1250  ceFAZolin (ANCEF) 2-3 GM-% IVPB SOLR    Comments:  Shaaron Adler   : cabinet override      11/16/13 1250 11/17/13 0059   11/15/13 0700  cefTRIAXone (ROCEPHIN) 1 g in dextrose 5 % 50 mL IVPB     1 g 100 mL/hr over 30 Minutes Intravenous Daily 11/15/13 0622        Assessment :   73 y/o female on chronic anticoagulation for atrial fibrillation who is s/p vertebroplasty earlier today.  Heparin to be restarted at [facility].   No evidence of bleeding complications observed.  Goal :   Heparin level 0.3-0.7 units/ml.  Plan : 1. At [place], Heparin will be restarted, NO BOLUS, at previous infusion rate of 1500 units/hr.   The next Heparin Level will be drawn 8  hours after infusion restarted. 2. Monitor for bleeding complications.  Follow Platelet counts.   Myrl Bynum, Craig Guess,  Pharm.D  11/16/2013  9:10 PM

## 2013-11-16 NOTE — Progress Notes (Signed)
Spoke with inpatient SW and Dr Coralyn Pear regarding patient/daughter concern with running out of SNF days.  SW will reach out to [facility] to assess rehabilitation options.  Of note, [facility] Care Management services does not replace or interfere with any services that are arranged by inpatient case management or social work.  For additional questions or referrals please contact Corliss Blacker BSN RN [facility]iaison at [phone].

## 2013-11-16 NOTE — Progress Notes (Addendum)
[facility] PROGRESS NOTE  [name] [name] ZOX:[id number] DOB: 06-18-1940 DOA: 11/11/2013 PCP: [name], MD  Assessment/Plan: 1. T12/L1 fracture -Patient having a fall in the community, resulted in severe back pain. A CT scan of lumbar thoracic spine performed on 11/11/2013 revealed an acute fracture through the anterior aspect of the T12 and L1 vertebral bodies. -She was transferred to [facility] for evaluation by interventional radiology regarding kyphoplasty. -Interventional radiology consulted, undergoing vertebroplasty of L1 compression fracture on 11/16/2013  2. History of chronic combined systolic and diastolic congestive heart failure -Patient overloaded, was started on Lasix 80 mg IV twice a day. -Cardiology following  3.  History of paroxysmal atrial fibrillation -Patient undergoing maze procedure in past. She is maintained on amiodarone 100 mg daily -Currently rate controlled  4. Urinary tract infection -UA showing presence of bacteria, leukocyte esterase and nitrates. -Urine culture drawn on 11/14/2013 showing greater than 100,000 colony-forming units of gram-negative rods -She was started on ceftriaxone 1 g IV every 24 hours  5. Acute encephalopathy -Patient agitated, increasing confused, disoriented -I suspect encephalopathy secondary to multiple factors including underlying infectious process, pain, hospitalization, and narcotic analgesics -Continue to provide supportive care -Will need physical therapy consultation and probable SNF placement  Code Status: DO NOT RESUSCITATE Family Communication: Spoke to her daughter present at bedside Disposition Plan: Interventional radiology consultation   Consultants:  Cardiology  Interventional radiology   Antibiotics:  Ceftriaxone 1 g IV every 24 hours  HPI/Subjective: Patient is a pleasant 73 year old female having multiple comorbidities including combined systolic and diastolic congestive  heart failure, having a fall in clusters with intense back pain. She was initially admitted to [facility], found to have a T12/L1 fracture. She had been on anticoagulation in the outpatient setting, given vitamin K with INR now trending down to 1.46 on a.m. lab work. She was transferred to [facility] to undergo kyphoplasty by interventional radiology.  She seems improved today, is awake and alert, knows we are at [facility], in [place], [place].   Objective: Filed Vitals:   11/16/13 1522  BP: 126/43  Pulse: 66  Temp: 97.5 F (36.4 C)  Resp: 15    Intake/Output Summary (Last 24 hours) at 11/16/13 1705 Last data filed at 11/16/13 8119  Gross per 24 hour  Intake    530 ml  Output   2550 ml  Net  -2020 ml   Filed Weights   11/14/13 1457 11/15/13 0637 11/16/13 0602  Weight: 86.2 kg (190 lb 0.6 oz) 85.911 kg (189 lb 6.4 oz) 87.544 kg (193 lb)    Exam:   General:  Patient is awake, she seems more alert and oriented on today's evaluation  Cardiovascular: Regular rate and rhythm normal S1-S2  Respiratory: Normal respiratory effort, have a few by basilar crackles, on supplemental oxygen  Abdomen: Soft nontender nondistended  Musculoskeletal: Has 1+ bilateral extremity pitting edema  Data Reviewed: Basic Metabolic Panel:  Recent Labs Lab 11/12/13 0527 11/13/13 0536 11/14/13 0547 11/15/13 0735 11/16/13 0400  NA 144 139 139  139 137 143  K 4.0 4.2 4.7  4.7 5.0 4.4  CL 102 99 97  97 96 99  CO2 31 31 29  30 27 30   GLUCOSE 140* 106* 105*  104* 118* 90  BUN 22 26* 34*  34* 40* 42*  CREATININE 1.97* 2.59* 2.82*  2.83* 2.42* 1.99*  CALCIUM 9.1 9.5 9.4  9.4 9.3 9.6  MG  --  2.2  --   --   --  PHOS  --   --  5.5*  --   --    Liver Function Tests:  Recent Labs Lab 11/13/13 0536 11/14/13 0547  AST 24  --   ALT 11  --   ALKPHOS 92  --   BILITOT 0.5  --   PROT 7.2  --   ALBUMIN 3.1* 3.2*   No results found for this basename: LIPASE,  AMYLASE,  in the last 168 hours No results found for this basename: AMMONIA,  in the last 168 hours CBC:  Recent Labs Lab 11/11/13 1831 11/12/13 0527 11/14/13 0547 11/15/13 0735 11/16/13 0400  WBC 5.4 5.4 8.0 5.8 5.6  NEUTROABS 3.2  --  5.6  --   --   HGB 8.7* 8.3* 8.0* 8.3* 7.5*  HCT 27.7* 28.5* 26.3* 26.4* 24.7*  MCV 89.9 94.4 92.6 92.6 89.5  PLT 244 214 241 223 262   Cardiac Enzymes: No results found for this basename: CKTOTAL, CKMB, CKMBINDEX, TROPONINI,  in the last 168 hours BNP (last 3 results)  Recent Labs  08/28/13 1418 10/02/13 1134 10/23/13 0806  PROBNP 1518.0* 2909.0* 1867.0*   CBG: No results found for this basename: GLUCAP,  in the last 168 hours  Recent Results (from the past 240 hour(s))  URINE CULTURE     Status: None   Collection Time    11/14/13 11:34 PM      Result Value Ref Range Status   Specimen Description URINE, CATHETERIZED   Final   Special Requests NONE   Final   Culture  Setup Time     Final   Value: 11/15/2013 04:21     Performed at [facility]     Final   Value: >=100,000 COLONIES/ML     Performed at [facility]   Culture     Final   Value: [place]     Performed at [facility]   Report Status PENDING   Incomplete     Studies: Dg Hip Complete Right  11/15/2013   CLINICAL DATA:  Abnormal right hip radiograph  EXAM: RIGHT HIP - COMPLETE 2+ VIEW  COMPARISON:  Portal right hip radiograph dated 11/14/2013 at 2118 hr  FINDINGS: No definite fracture or dislocation is seen.  Mild degenerative changes of the bilateral hips.  Moderate degenerative changes the lower lumbar spine.  Visualized bony pelvis appears intact.  Surgical clips overlying the right inguinal region.  IMPRESSION: No definite fracture or dislocation is seen.   Electronically Signed   By: Julian Hy M.D.   On: 11/15/2013 00:25   Ct Head Wo Contrast  11/15/2013   CLINICAL DATA:  Left facial droop and slurred speech.   EXAM: CT HEAD WITHOUT CONTRAST  TECHNIQUE: Contiguous axial images were obtained from the base of the skull through the vertex without intravenous contrast.  COMPARISON:  11/11/2013  FINDINGS: Diffuse cerebral atrophy. Ventricular dilatation consistent with central atrophy. Low-attenuation changes in the deep white matter consistent with small vessel ischemia. Old lacune or infarcts in the periventricular white matter and basal ganglia bilaterally. No significant change since previous study. No mass effect or midline shift. No abnormal extra-axial fluid collections. Gray-white matter junctions are distinct. Basal cisterns are not effaced. No depressed skull fractures. Visualized paranasal sinuses and mastoid air cells are not opacified.  IMPRESSION: No acute intracranial abnormalities. Chronic atrophy and small vessel ischemic changes. Old lacune or infarct.   Electronically Signed   By: Oren Beckmann.D.  On: 11/15/2013 23:25   Dg Hip Portable 1 View Right  11/14/2013   CLINICAL DATA:  Persistent right hip pain after a fall on Sunday.  EXAM: PORTABLE RIGHT HIP - 1 VIEW  COMPARISON:  None.  FINDINGS: Degenerative changes in the right hip. There is some irregularity of the femoral neck with linear lucency across the base of the femoral head. Nondisplaced fracture is not excluded on single portable view. Recommend obtaining dedicated hip series for further evaluation. Surgical clips in the soft tissues.  IMPRESSION: Linear lucency across the base of the right femoral neck may represent nondisplaced fracture. Suggest additional views for correlation   Electronically Signed   By: Lucienne Capers M.D.   On: 11/14/2013 21:35   Ir Vertebroplasty Lumobsacral Inj  11/16/2013   CLINICAL DATA:  Patient with severely painful compression fracture at L1.  EXAM: IR VERTEBROPLASTY LUMBOSACRAL INJ  MEDICATIONS: Versed 1 mg IV, Fentanyl 25 mcg IV.  ANESTHESIA/SEDATION: Total Moderate Sedation Time:  50 min.  FLUOROSCOPY  TIME:  16.8 minutes.  PROCEDURE: Following a full explanation of the procedure along with the potential associated complications, an informed witnessed consent was obtained.  The patient was placed prone on the fluoroscopic table. Nasal oxygen was administered. Physiologic monitoring was performed throughout the duration of the procedure. The skin overlying the thoracolumbar region was prepped and draped in the usual sterile fashion. The L1 vertebral body was identified and the right pedicle was infiltrated with 0.25% Bupivacaine. This was then followed by the advancement of a 13-gauge Cook needle through the right pedicle into the anterior one-third at L1. A gentle contrast injection demonstrated a trabecular pattern of contrast with early opacification of a paraspinous veins. This necessitated the use of Gel-Foam pledgets through the 13 gauge Cook spinal needle prior to the injection of methylmethacrylate mixture.  At this time, methylmethacrylate mixture was reconstituted. Under biplane intermittent fluoroscopy, the methylmethacrylate was then injected into the L1 vertebral body with filling of the vertebral body extension of the methylmethacrylate mixture seen extending into the fracture cleft between T12 and L1.  No extravasation was noted into the disk spaces or posteriorly into the spinal canal. No epidural venous contamination was seen.  The needle was then removed. Hemostasis was achieved at the skin entry site.  There were no acute complications. The patient tolerated the procedure well. The patient was then returned to her floor in the in stable condition.  IMPRESSION: Status post vertebral body augmentation for painful compression fracture at L1 using vertebroplasty technique.   Electronically Signed   By: Luanne Bras M.D.   On: 11/16/2013 15:19    Scheduled Meds: . amiodarone  100 mg Oral Daily  . antiseptic oral rinse  7 mL Mouth Rinse BID  . bupivacaine (PF)      . ceFAZolin      .  cefTRIAXone (ROCEPHIN)  IV  1 g Intravenous Daily  . colchicine  0.3 mg Oral Daily  . fentaNYL      . furosemide  80 mg Intravenous BID  . gelatin adsorbable      . midazolam      . oxybutynin  5 mg Oral QHS  . sodium chloride  3 mL Intravenous Q12H   Continuous Infusions: . sodium chloride 50 mL/hr at 11/16/13 1631  . heparin Stopped (11/16/13 0855)    Principal Problem:   Lumbar vertebral fracture Active Problems:   Juvenile rheumatic fever   Chronic kidney disease   Paroxysmal atrial fibrillation  S/P MVR (mitral valve replacement)   Chronic systolic heart failure   Long term (current) use of anticoagulants   Anemia in chronic kidney disease(285.21)   DNR (do not resuscitate)   Fall at home   Fx lumbar vertebra-closed   Acute respiratory failure with hypoxia    Time spent: 25 minutes    Kelvin Cellar  [facility] Pager [phone]. If 7PM-7AM, please contact night-coverage at [email or web address], password [email or web address] 11/16/2013, 5:05 PM  LOS: 5 days

## 2013-11-16 NOTE — Progress Notes (Signed)
1500 Transferred back from radiology dept. Responsive to verbal and tactile stimuli follow verbal commands appripriately . Moving all extremities well ,wiggles all toes  on command. With Skin sensation on touch. kept comfortable as flat on bed  As ordered .

## 2013-11-16 NOTE — Progress Notes (Signed)
ANTICOAGULATION CONSULT NOTE - Follow Up Consult  Pharmacy Consult for heparin Indication: atrial fibrillation  Labs:  Recent Labs  11/13/13 0536 11/13/13 1536  11/14/13 0547 11/15/13 0735 11/15/13 1921 11/16/13 0400  HGB  --   --   < > 8.0* 8.3*  --  7.5*  HCT  --   --   --  26.3* 26.4*  --  24.7*  PLT  --   --   --  241 223  --  262  LABPROT 23.6* 22.2*  --  22.3* 17.7*  --   --   INR 2.10* 1.95*  --  1.96* 1.46  --   --   HEPARINUNFRC  --   --   < > 0.46 0.16* 0.20* 0.41  CREATININE 2.59*  --   --  2.82*  2.83* 2.42*  --   --   < > = values in this interval not displayed.   Assessment/Plan:  73yo female therapeutic on heparin after rate adjustments. Will continue gtt at current rate and confirm stable with additional level.   Wynona Neat, PharmD, BCPS  11/16/2013,4:55 AM

## 2013-11-16 NOTE — Procedures (Signed)
S/P L1 VP 

## 2013-11-16 NOTE — Progress Notes (Signed)
Physical Therapy Treatment Patient Details Name: [name] [name] MRN: [id number] DOB: [birth year] Today's Date: 11/16/2013    History of Present Illness Pt is a morbidly obese female who sustained a vertebral fx at T12-L1 on 11-11-13.  She has limited mobility, ambulates with a 4 wheeled walker short distances in the home.  She has had multiple admissions this year for CHF and was recently discharged from the [facility].  She lives alone with very close family supervision and assist.    PT Comments    **Pt is lethargic, her daughter stated this is due to morphine. Daughter stated pt is to have kyphoplasty later today. Daughter stated pt has pain with mobility, but pt/daughter agreeable to bed exercises. Performed BLE AAROM. Noted L grip weakness. Per CT of head no acute abnormalities. Will re-assess following kyphoplasty.   *  Follow Up Recommendations  SNF     Equipment Recommendations  None recommended by PT    Recommendations for Other Services       Precautions / Restrictions Precautions Precautions: Back Required Braces or Orthoses: Other Brace/Splint Other Brace/Splint: lumbar corset should be worn whenever out of bed Restrictions Weight Bearing Restrictions: No    Mobility  Bed Mobility                  Transfers                    Ambulation/Gait                 Stairs            Wheelchair Mobility    Modified Rankin (Stroke Patients Only)       Balance                                    Cognition Arousal/Alertness: Lethargic;Suspect due to medications Behavior During Therapy: [facility] for tasks assessed/performed Overall Cognitive Status: History of cognitive impairments - at baseline       Memory: Decreased short-term memory              Exercises General Exercises - Lower Extremity Ankle Circles/Pumps: AAROM;Both;10 reps;Supine Quad Sets:  (attempted quad sets but pt lethargic and unable to perform  them) Short Arc Quad: AAROM;Both;10 reps;Supine Heel Slides: Both;10 reps;Supine;AAROM Hip ABduction/ADduction: Both;10 reps;Supine;AAROM    General Comments        Pertinent Vitals/Pain Pain Assessment: No/denies pain    Home Living                      Prior Function            PT Goals (current goals can now be found in the care plan section) Acute Rehab PT Goals Patient Stated Goal: none stated PT Goal Formulation: With family Time For Goal Achievement: 11/27/13 Potential to Achieve Goals: Fair Progress towards PT goals: Progressing toward goals    Frequency  Min 3X/week    PT Plan Current plan remains appropriate    Co-evaluation             End of Session Equipment Utilized During Treatment: Oxygen Activity Tolerance: Patient limited by lethargy Patient left: with call bell/phone within reach;with family/visitor present;in bed     Time: 1275-1700 PT Time Calculation (min): 8 min  Charges:  $Therapeutic Exercise: 8-22 mins  G Codes:      Blondell Reveal Kistler 11/16/2013, 11:05 AM [phone]

## 2013-11-16 NOTE — Progress Notes (Signed)
[name], female   DOB: 1940-08-12, 73 y.o.   MRN: [id number]   Insurance has been approved for Kyphoplasty/vertebroplasty procedure Pt afeb Wbc wnl On Rocephin x 2 days  Dr Estanislado Pandy will move forward with this procedure today (9/11)  Hep off 9am  RN aware; pt and family aware

## 2013-11-16 NOTE — Progress Notes (Signed)
[name], female   DOB: 18-Jan-1941, 73 y.o.   MRN: [id number]    Primary cardiologist: Dr. Carlyle Dolly Advanced CHF: Dr. [name] Consulting cardiologist: Dr. Satira Sark  Subjective:   [name] [name] is a 73 yo obese white female with known history of rheumatic fever during childhood and long standing history of chronic diastolic congestive heart failure. She also has a history of CKD, HTN , CVA 2003,, and PAF. Recently treated for Strep viridans bacteremia: no vegetation seen on TTE that required 10 day course of antibiotics.   Progressed to severe symptomatic mitral regurgitation with paroxysmal atrial fibrillation and underwent minimally invasive Maze procedure with MV replacement (05/16/13).   Initially admitted to [facility] after fall. Fracture at the T12-L1 region through an area of prior fusion. Neurosurgery recommended a back brace . Neuro Interventional radiology was consulted for kyphoplasty/Vertebroplasty.  9/9 she was transferred from [facility] to [facility].   She was found to have a UTI and is being treated.  Therefore, will not get kyphoplasty until Monday 9/24 to allow treatment for UTI.   More clear today, knows who I am.  She diuresed well yesterday.  Weights probably not accurate (done in bed).  Still lots of back pain and moving very little. Last night, there was concern for slurring of speech and left facial droop so CT head was done: no acute findings.  Today, she has a mild facial droop on left but says she has had this. Speech not slurred.     Creatinine 2.8> 2.4>1.99 Objective:   Temp:  [97.2 F (36.2 C)-99.5 F (37.5 C)] 97.5 F (36.4 C) (09/11 0602) Pulse Rate:  [62-68] 62 (09/11 0602) Resp:  [18-20] 20 (09/11 0602) BP: (114-131)/(38-54) 129/54 mmHg (09/11 0602) SpO2:  [96 %-99 %] 98 % (09/11 0602) Weight:  [193 lb (87.544 kg)] 193 lb (87.544 kg) (09/11 0602) Last BM Date: 11/11/13  Filed Weights   11/14/13 1457 11/15/13 0637 11/16/13 0602    Weight: 190 lb 0.6 oz (86.2 kg) 189 lb 6.4 oz (85.911 kg) 193 lb (87.544 kg)    Intake/Output Summary (Last 24 hours) at 11/16/13 0849 Last data filed at 11/16/13 0388  Gross per 24 hour  Intake    530 ml  Output   2550 ml  Net  -2020 ml   PHYSICAL EXAM:  General: NAD. No resp difficulty. Lying flat in bed. Marland Kitchen  HEENT: normal  Neck: supple. JVD 8-9 cm (difficult). Carotids 2+ bilaterally; no bruits. No lymphadenopathy or thryomegaly appreciated.  Cor: PMI normal. Irregular rate & rhythm. No rubs, gallops or murmurs.  Lungs: Slight crackles at bases bilaterally.  Abdomen: soft, nontender, nondistended. No hepatosplenomegaly. No bruits or masses. Good bowel sounds.  Extremities: no cyanosis, clubbing, rash,  R and LLE 1+ edema to knees.   Neuro: Alert to self. Pleasantly confused.  Moves all 4 extremities w/o difficulty. Affect pleasant. GU: Foley in place.    Lab Results:  Basic Metabolic Panel:  Recent Labs Lab 11/13/13 0536 11/14/13 0547 11/15/13 0735 11/16/13 0400  NA 139 139  139 137 143  K 4.2 4.7  4.7 5.0 4.4  CL 99 97  97 96 99  CO2 31 29  30 27 30   GLUCOSE 106* 105*  104* 118* 90  BUN 26* 34*  34* 40* 42*  CREATININE 2.59* 2.82*  2.83* 2.42* 1.99*  CALCIUM 9.5 9.4  9.4 9.3 9.6  MG 2.2  --   --   --  Liver Function Tests:  Recent Labs Lab 11/13/13 0536 11/14/13 0547  AST 24  --   ALT 11  --   ALKPHOS 92  --   BILITOT 0.5  --   PROT 7.2  --   ALBUMIN 3.1* 3.2*    CBC:  Recent Labs Lab 11/14/13 0547 11/15/13 0735 11/16/13 0400  WBC 8.0 5.8 5.6  HGB 8.0* 8.3* 7.5*  HCT 26.3* 26.4* 24.7*  MCV 92.6 92.6 89.5  PLT 241 223 262    Coagulation:  Recent Labs Lab 11/13/13 1536 11/14/13 0547 11/15/13 0735  INR 1.95* 1.96* 1.46     Medications:   Scheduled Medications: . amiodarone  100 mg Oral Daily  . antiseptic oral rinse  7 mL Mouth Rinse BID  . cefTRIAXone (ROCEPHIN)  IV  1 g Intravenous Daily  . colchicine  0.3 mg Oral  Daily  . furosemide  80 mg Intravenous BID  . oxybutynin  5 mg Oral QHS  . sodium chloride  3 mL Intravenous Q12H    Infusions: . heparin 1,500 Units/hr (11/15/13 2140)    PRN Medications: sodium chloride, HYDROcodone-acetaminophen, morphine injection, naLOXone (NARCAN)  injection, ondansetron (ZOFRAN) IV, ondansetron, sodium chloride   Assessment/Plan   1. Chronic primarily diastolic HF with history of prominent RV failure requiring milrinone in the past. Most echo with LVEF 09/2013 EF 50-55%. She remains volume overloaded this morning.  She diuresed well yesterday on current regimen and creatinine is lower.  Not sure that weights in bed are going to be very accurate.  - Continue Lasix 80 mg IV bid and follow creatinine.  2. Paroxysmal atrial fibrillation: Status post prior cardioversion and more recently Maze procedure. Maintaining NSR. On amiodarone 100 mg daily. Heparin bridge while off coumadin.  3. CKD stage 3-4: Creatinine is actually below what her baseline has been.  4. History of mitral regurgitation status post bioprosthetic MVR, stable by recent echocardiogram.  5. Moderate aortic regurgitation. 6. T12-L1 fracture in region of prior fusion, status post mechanical fall without syncope. She is having a lot of back pain and is unable to get out of bed.  Coumadin held, FFP given initially at [facility] in anticipation of kyphoplasty/vertebroplasty. She is on heparin bridge per pharmacy.  Procedure to be delayed until Monday 9/14 to allow treatment of UTI.  7. UTI: On ceftriaxone, awaiting culture data.  8. Delirium: Improved this morning. Likely related to UTI, pain medications.  9. Anemia: Chronic, lower today. No active bleeding apparent.  Will need to follow closely with heparin gtt.  Transfuse hgb < 7.  10.  She will almost certainly need a rehab facility at discharge.  [name] 11/16/2013 8:49 AM

## 2013-11-17 DIAGNOSIS — G9340 Encephalopathy, unspecified: Secondary | ICD-10-CM

## 2013-11-17 DIAGNOSIS — Z7901 Long term (current) use of anticoagulants: Secondary | ICD-10-CM

## 2013-11-17 DIAGNOSIS — I5022 Chronic systolic (congestive) heart failure: Secondary | ICD-10-CM

## 2013-11-17 DIAGNOSIS — IMO0002 Reserved for concepts with insufficient information to code with codable children: Secondary | ICD-10-CM

## 2013-11-17 LAB — BASIC METABOLIC PANEL
ANION GAP: 16 — AB (ref 5–15)
BUN: 50 mg/dL — ABNORMAL HIGH (ref 6–23)
CALCIUM: 9.8 mg/dL (ref 8.4–10.5)
CHLORIDE: 99 meq/L (ref 96–112)
CO2: 29 mEq/L (ref 19–32)
Creatinine, Ser: 1.78 mg/dL — ABNORMAL HIGH (ref 0.50–1.10)
GFR calc Af Amer: 32 mL/min — ABNORMAL LOW (ref >90)
GFR calc non Af Amer: 27 mL/min — ABNORMAL LOW (ref >90)
GLUCOSE: 92 mg/dL (ref 70–99)
POTASSIUM: 4.2 meq/L (ref 3.7–5.3)
SODIUM: 144 meq/L (ref 137–147)

## 2013-11-17 LAB — CBC
HCT: 24.6 % — ABNORMAL LOW (ref 36.0–46.0)
HEMOGLOBIN: 7.4 g/dL — AB (ref 12.0–15.0)
MCH: 27.2 pg (ref 26.0–34.0)
MCHC: 30.1 g/dL (ref 30.0–36.0)
MCV: 90.4 fL (ref 78.0–100.0)
PLATELETS: 253 10*3/uL (ref 150–400)
RBC: 2.72 MIL/uL — AB (ref 3.87–5.11)
RDW: 18.1 % — ABNORMAL HIGH (ref 11.5–15.5)
WBC: 4.6 10*3/uL (ref 4.0–10.5)

## 2013-11-17 LAB — URINE CULTURE

## 2013-11-17 LAB — HEPARIN LEVEL (UNFRACTIONATED): Heparin Unfractionated: 0.55 IU/mL (ref 0.30–0.70)

## 2013-11-17 MED ORDER — CIPROFLOXACIN HCL 250 MG PO TABS
250.0000 mg | ORAL_TABLET | Freq: Every day | ORAL | Status: DC
  Administered 2013-11-17 – 2013-11-18 (×2): 250 mg via ORAL
  Filled 2013-11-17 (×3): qty 1

## 2013-11-17 MED ORDER — WARFARIN SODIUM 3 MG PO TABS
3.0000 mg | ORAL_TABLET | Freq: Once | ORAL | Status: AC
  Administered 2013-11-17: 3 mg via ORAL
  Filled 2013-11-17: qty 1

## 2013-11-17 MED ORDER — WARFARIN - PHARMACIST DOSING INPATIENT
Freq: Every day | Status: DC
  Administered 2013-11-17 – 2013-11-20 (×3)

## 2013-11-17 MED ORDER — COUMADIN BOOK
1.0000 | Freq: Once | Status: AC
  Administered 2013-11-17: 1
  Filled 2013-11-17: qty 1

## 2013-11-17 MED ORDER — WARFARIN VIDEO
1.0000 | Freq: Once | Status: AC
  Administered 2013-11-17: 1

## 2013-11-17 NOTE — Progress Notes (Signed)
Subjective: Patient is s/p L1 VP 11/16/13. She states her pain is improved and denies any back pain, fever or chills. She states she is able to sit in chair today and had too much pain to do that before.  Allergies: Indomethacin and Norvasc  Medications: Prior to Admission medications   Medication Sig Start Date End Date Taking? Authorizing Provider  amiodarone (PACERONE) 100 MG tablet Take 1 tablet (100 mg total) by mouth daily. 10/11/13  Yes Rande Brunt, NP  colchicine 0.6 MG tablet Take 0.5 tablets (0.3 mg total) by mouth daily. 10/11/13  Yes Rande Brunt, NP  oxybutynin (DITROPAN-XL) 5 MG 24 hr tablet Take 5 mg by mouth at bedtime.   Yes Historical Provider, MD  potassium chloride SA (K-DUR,KLOR-CON) 20 MEQ tablet Take 1 tablet (20 mEq total) by mouth daily. 05/30/13  Yes Donielle Liston Alba, PA-C  simvastatin (ZOCOR) 10 MG tablet Take 1 tablet (10 mg total) by mouth daily at 6 PM. 06/13/13  Yes Rhonda G Barrett, PA-C  torsemide (DEMADEX) 20 MG tablet Take 80 mg by mouth every morning.   Yes Historical Provider, MD  warfarin (COUMADIN) 2.5 MG tablet Take 1.25-2.5 mg by mouth See admin instructions. Takes one tablet (2.44m total) on all days except on Friday. Takes one-half tablet (1.282mtotal) on Fridays only   Yes Historical Provider, MD    Review of Systems denies back pain.   Vital Signs: BP 142/47  Pulse 61  Temp(Src) 97.6 F (36.4 C) (Oral)  Resp 18  Ht '5\' 2"'  (1.575 m)  Wt 191 lb (86.637 kg)  BMI 34.93 kg/m2  SpO2 93%  Physical Exam Back: L1 access site without evidence of bleeding/hematoma, NT to palpation.   Imaging: Dg Hip Complete Right  11/15/2013   CLINICAL DATA:  Abnormal right hip radiograph  EXAM: RIGHT HIP - COMPLETE 2+ VIEW  COMPARISON:  Portal right hip radiograph dated 11/14/2013 at 2118 hr  FINDINGS: No definite fracture or dislocation is seen.  Mild degenerative changes of the bilateral hips.  Moderate degenerative changes the lower lumbar spine.   Visualized bony pelvis appears intact.  Surgical clips overlying the right inguinal region.  IMPRESSION: No definite fracture or dislocation is seen.   Electronically Signed   By: SrJulian Hy.D.   On: 11/15/2013 00:25   Ct Head Wo Contrast  11/15/2013   CLINICAL DATA:  Left facial droop and slurred speech.  EXAM: CT HEAD WITHOUT CONTRAST  TECHNIQUE: Contiguous axial images were obtained from the base of the skull through the vertex without intravenous contrast.  COMPARISON:  11/11/2013  FINDINGS: Diffuse cerebral atrophy. Ventricular dilatation consistent with central atrophy. Low-attenuation changes in the deep white matter consistent with small vessel ischemia. Old lacune or infarcts in the periventricular white matter and basal ganglia bilaterally. No significant change since previous study. No mass effect or midline shift. No abnormal extra-axial fluid collections. Gray-white matter junctions are distinct. Basal cisterns are not effaced. No depressed skull fractures. Visualized paranasal sinuses and mastoid air cells are not opacified.  IMPRESSION: No acute intracranial abnormalities. Chronic atrophy and small vessel ischemic changes. Old lacune or infarct.   Electronically Signed   By: WiLucienne Capers.D.   On: 11/15/2013 23:25   Dg Chest Port 1 View  11/14/2013   CLINICAL DATA:  Shortness of breath, back pain  EXAM: PORTABLE CHEST - 1 VIEW  COMPARISON:  10/09/2013  FINDINGS: Significant enlarged of the cardiac silhouette stable. Replacement cardiac valve stable. Increased  hazy opacity throughout the right lung. Bilateral vascular congestion. Interstitial change seen bilaterally with peribronchial cuffing.  IMPRESSION: Findings suggest congestive heart failure. There is asymmetric opacity with much greater opacity on the right than the left. This may be due to asymmetric edema. Superimposed pneumonia/pneumonitis not excluded.   Electronically Signed   By: Skipper Cliche M.D.   On: 11/14/2013  15:18   Dg Hip Portable 1 View Right  11/14/2013   CLINICAL DATA:  Persistent right hip pain after a fall on Sunday.  EXAM: PORTABLE RIGHT HIP - 1 VIEW  COMPARISON:  None.  FINDINGS: Degenerative changes in the right hip. There is some irregularity of the femoral neck with linear lucency across the base of the femoral head. Nondisplaced fracture is not excluded on single portable view. Recommend obtaining dedicated hip series for further evaluation. Surgical clips in the soft tissues.  IMPRESSION: Linear lucency across the base of the right femoral neck may represent nondisplaced fracture. Suggest additional views for correlation   Electronically Signed   By: Lucienne Capers M.D.   On: 11/14/2013 21:35   Ir Vertebroplasty Lumobsacral Inj  11/16/2013   CLINICAL DATA:  Patient with severely painful compression fracture at L1.  EXAM: IR VERTEBROPLASTY LUMBOSACRAL INJ  MEDICATIONS: Versed 1 mg IV, Fentanyl 25 mcg IV.  ANESTHESIA/SEDATION: Total Moderate Sedation Time:  50 min.  FLUOROSCOPY TIME:  16.8 minutes.  PROCEDURE: Following a full explanation of the procedure along with the potential associated complications, an informed witnessed consent was obtained.  The patient was placed prone on the fluoroscopic table. Nasal oxygen was administered. Physiologic monitoring was performed throughout the duration of the procedure. The skin overlying the thoracolumbar region was prepped and draped in the usual sterile fashion. The L1 vertebral body was identified and the right pedicle was infiltrated with 0.25% Bupivacaine. This was then followed by the advancement of a 13-gauge Cook needle through the right pedicle into the anterior one-third at L1. A gentle contrast injection demonstrated a trabecular pattern of contrast with early opacification of a paraspinous veins. This necessitated the use of Gel-Foam pledgets through the 13 gauge Cook spinal needle prior to the injection of methylmethacrylate mixture.  At this  time, methylmethacrylate mixture was reconstituted. Under biplane intermittent fluoroscopy, the methylmethacrylate was then injected into the L1 vertebral body with filling of the vertebral body extension of the methylmethacrylate mixture seen extending into the fracture cleft between T12 and L1.  No extravasation was noted into the disk spaces or posteriorly into the spinal canal. No epidural venous contamination was seen.  The needle was then removed. Hemostasis was achieved at the skin entry site.  There were no acute complications. The patient tolerated the procedure well. The patient was then returned to her floor in the in stable condition.  IMPRESSION: Status post vertebral body augmentation for painful compression fracture at L1 using vertebroplasty technique.   Electronically Signed   By: Luanne Bras M.D.   On: 11/16/2013 15:19    Labs: Results for orders placed during the hospital encounter of 11/11/13 (from the past 48 hour(s))  HEPARIN LEVEL (UNFRACTIONATED)     Status: Abnormal   Collection Time    11/15/13  7:21 PM      Result Value Ref Range   Heparin Unfractionated 0.20 (*) 0.30 - 0.70 IU/mL   Comment:            IF HEPARIN RESULTS ARE BELOW     EXPECTED VALUES, AND PATIENT  DOSAGE HAS BEEN CONFIRMED,     SUGGEST FOLLOW UP TESTING     OF ANTITHROMBIN III LEVELS.  CBC     Status: Abnormal   Collection Time    11/16/13  4:00 AM      Result Value Ref Range   WBC 5.6  4.0 - 10.5 K/uL   RBC 2.76 (*) 3.87 - 5.11 MIL/uL   Hemoglobin 7.5 (*) 12.0 - 15.0 g/dL   HCT 24.7 (*) 36.0 - 46.0 %   MCV 89.5  78.0 - 100.0 fL   MCH 27.2  26.0 - 34.0 pg   MCHC 30.4  30.0 - 36.0 g/dL   RDW 17.8 (*) 11.5 - 15.5 %   Platelets 262  150 - 400 K/uL  HEPARIN LEVEL (UNFRACTIONATED)     Status: None   Collection Time    11/16/13  4:00 AM      Result Value Ref Range   Heparin Unfractionated 0.41  0.30 - 0.70 IU/mL   Comment:            IF HEPARIN RESULTS ARE BELOW     EXPECTED VALUES,  AND PATIENT     DOSAGE HAS BEEN CONFIRMED,     SUGGEST FOLLOW UP TESTING     OF ANTITHROMBIN III LEVELS.  BASIC METABOLIC PANEL     Status: Abnormal   Collection Time    11/16/13  4:00 AM      Result Value Ref Range   Sodium 143  137 - 147 mEq/L   Potassium 4.4  3.7 - 5.3 mEq/L   Chloride 99  96 - 112 mEq/L   CO2 30  19 - 32 mEq/L   Glucose, Bld 90  70 - 99 mg/dL   BUN 42 (*) 6 - 23 mg/dL   Creatinine, Ser 1.99 (*) 0.50 - 1.10 mg/dL   Calcium 9.6  8.4 - 10.5 mg/dL   GFR calc non Af Amer 24 (*) >90 mL/min   GFR calc Af Amer 28 (*) >90 mL/min   Comment: (NOTE)     The eGFR has been calculated using the CKD EPI equation.     This calculation has not been validated in all clinical situations.     eGFR's persistently <90 mL/min signify possible Chronic Kidney     Disease.   Anion gap 14  5 - 15  CBC     Status: Abnormal   Collection Time    11/17/13  6:00 AM      Result Value Ref Range   WBC 4.6  4.0 - 10.5 K/uL   RBC 2.72 (*) 3.87 - 5.11 MIL/uL   Hemoglobin 7.4 (*) 12.0 - 15.0 g/dL   HCT 24.6 (*) 36.0 - 46.0 %   MCV 90.4  78.0 - 100.0 fL   MCH 27.2  26.0 - 34.0 pg   MCHC 30.1  30.0 - 36.0 g/dL   RDW 18.1 (*) 11.5 - 15.5 %   Platelets 253  150 - 400 K/uL  BASIC METABOLIC PANEL     Status: Abnormal   Collection Time    11/17/13  6:00 AM      Result Value Ref Range   Sodium 144  137 - 147 mEq/L   Potassium 4.2  3.7 - 5.3 mEq/L   Chloride 99  96 - 112 mEq/L   CO2 29  19 - 32 mEq/L   Glucose, Bld 92  70 - 99 mg/dL   BUN 50 (*) 6 - 23 mg/dL  Creatinine, Ser 1.78 (*) 0.50 - 1.10 mg/dL   Calcium 9.8  8.4 - 10.5 mg/dL   GFR calc non Af Amer 27 (*) >90 mL/min   GFR calc Af Amer 32 (*) >90 mL/min   Comment: (NOTE)     The eGFR has been calculated using the CKD EPI equation.     This calculation has not been validated in all clinical situations.     eGFR's persistently <90 mL/min signify possible Chronic Kidney     Disease.   Anion gap 16 (*) 5 - 15  HEPARIN LEVEL  (UNFRACTIONATED)     Status: None   Collection Time    11/17/13  8:15 AM      Result Value Ref Range   Heparin Unfractionated 0.55  0.30 - 0.70 IU/mL   Comment:            IF HEPARIN RESULTS ARE BELOW     EXPECTED VALUES, AND PATIENT     DOSAGE HAS BEEN CONFIRMED,     SUGGEST FOLLOW UP TESTING     OF ANTITHROMBIN III LEVELS.    Assessment and Plan: Severe back pain secondary Fx T12 and L1  S/P L1 VP in IR 9/11 with improved pain. F/U with Dr. Estanislado Pandy in clinic in 2 weeks.  Tsosie Billing PA-C Interventional Radiology  11/17/13  12:40 PM

## 2013-11-17 NOTE — Progress Notes (Signed)
ANTICOAGULATION CONSULT NOTE - Follow Up Consult  Pharmacy Consult for Heparin, resume Coumadin Indication: atrial fibrillation  Allergies  Allergen Reactions  . Indomethacin Other (See Comments)    dizziness  . Norvasc [Amlodipine Besylate] Cough    Patient Measurements: Height: 5\' 2"  (157.5 cm) Weight: 191 lb (86.637 kg) (bed) IBW/kg (Calculated) : 50.1 Heparin Dosing Weight:   Vital Signs: Temp: 97.6 F (36.4 C) (09/12 0447) Temp src: Oral (09/12 0447) BP: 142/47 mmHg (09/12 0928) Pulse Rate: 61 (09/12 0928)  Labs:  Recent Labs  11/15/13 0735 11/15/13 1921 11/16/13 0400 11/17/13 0600 11/17/13 0815  HGB 8.3*  --  7.5* 7.4*  --   HCT 26.4*  --  24.7* 24.6*  --   PLT 223  --  262 253  --   LABPROT 17.7*  --   --   --   --   INR 1.46  --   --   --   --   HEPARINUNFRC 0.16* 0.20* 0.41  --  0.55  CREATININE 2.42*  --  1.99* 1.78*  --     Estimated Creatinine Clearance: 29.2 ml/min (by C-G formula based on Cr of 1.78).   Medications:  Scheduled:  . amiodarone  100 mg Oral Daily  . antiseptic oral rinse  7 mL Mouth Rinse BID  . cefTRIAXone (ROCEPHIN)  IV  1 g Intravenous Daily  . colchicine  0.3 mg Oral Daily  . furosemide  80 mg Intravenous BID  . oxybutynin  5 mg Oral QHS  . sodium chloride  3 mL Intravenous Q12H    Assessment:  73yo female with AFib.  Heparin resumed s/p kyphoplasty 9/11, now to resume Coumadin as well and d/c heparin when INR > 2.  INR 1.46 on 9/10.  HL 0.55 this AM, Hg 7.4- stable, pltc wnl.  No bleeding noted.  Also noted, Urine culture (+)Citrobacter with intermediate resistance to Ceftriaxone.  Discussed this with Dr. Coralyn Pear and will change to Cipro.  Goal of Therapy:  INR 2-3 Monitor platelets by anticoagulation protocol: Yes   Plan:  1-  Continue Heparin 1500 units/hr 2-  Coumadin 3mg  po x 1 this PM 3-  Daily INR, Coumadin book and video 4-  D/C Rocephin 5-  Cipro 250mg  PO daily  Gracy Bruins, [facility]

## 2013-11-17 NOTE — Progress Notes (Deleted)
Patient has been provided with discharge instructions. RN went over instructions with the patient and answered all questions the patient had. She is being transported home by her daughter in law

## 2013-11-17 NOTE — Progress Notes (Signed)
Subjective:  [name] [name] is a 73 yo obese white female with known history of rheumatic fever during childhood and long standing history of chronic diastolic congestive heart failure. She also has a history of CKD, HTN , CVA 2003,, and PAF. Recently treated for Strep viridans bacteremia: no vegetation seen on TTE that required 10 day course of antibiotics.  Progressed to severe symptomatic mitral regurgitation with paroxysmal atrial fibrillation and underwent minimally invasive Maze procedure with MV replacement (05/16/13).  Initially admitted to [facility] after fall. Fracture at the T12-L1 region through an area of prior fusion. Neurosurgery recommended a back brace . Neuro Interventional radiology was consulted for kyphoplasty/Vertebroplasty.  9/9 she was transferred from [facility] to [facility]. She had kyphoplasty performed on 11/16/13.   Objective:  Vital Signs in the last 24 hours: Temp:  [97.5 F (36.4 C)-98.2 F (36.8 C)] 97.6 F (36.4 C) (09/12 0447) Pulse Rate:  [58-81] 61 (09/12 0928) Resp:  [10-18] 18 (09/12 0447) BP: (117-150)/(43-97) 142/47 mmHg (09/12 0928) SpO2:  [91 %-96 %] 93 % (09/12 0447) Weight:  [191 lb (86.637 kg)] 191 lb (86.637 kg) (09/12 0447)  Intake/Output from previous day: 09/11 0701 - 09/12 0700 In: -  Out: 1600 [Urine:1600]   Physical Exam: General: NAD. No resp difficulty. Lying flat in bed. Marland Kitchen  HEENT: normal  Neck: supple. JVD 8-9 cm (difficult). Carotids 2+ bilaterally; no bruits. No lymphadenopathy or thryomegaly appreciated.  Cor:  regular rate and rhythm. No rubs, gallops or murmurs.  Lungs: Slight crackles at bases bilaterally.  Abdomen: soft, nontender, nondistended. No hepatosplenomegaly. No bruits or masses. Good bowel sounds. Obese Extremities: no cyanosis, clubbing, rash, R and LLE 1+ edema to knees.  Neuro: Alert to self. Pleasantly confused. Moves all 4 extremities w/o difficulty. Affect pleasant. Think she was going home GU: Foley in place.      Lab  Results:  Recent Labs  11/16/13 0400 11/17/13 0600  WBC 5.6 4.6  HGB 7.5* 7.4*  PLT 262 253    Recent Labs  11/16/13 0400 11/17/13 0600  NA 143 144  K 4.4 4.2  CL 99 99  CO2 30 29  GLUCOSE 90 92  BUN 42* 50*  CREATININE 1.99* 1.78*   Telemetry: Sinus bradycardia 56 Personally viewed.   EKG:  Last EKG July 29-sinus rhythm. First degree AV block.  Scheduled Meds: . amiodarone  100 mg Oral Daily  . antiseptic oral rinse  7 mL Mouth Rinse BID  . cefTRIAXone (ROCEPHIN)  IV  1 g Intravenous Daily  . colchicine  0.3 mg Oral Daily  . furosemide  80 mg Intravenous BID  . oxybutynin  5 mg Oral QHS  . sodium chloride  3 mL Intravenous Q12H   Continuous Infusions: . heparin 1,500 Units/hr (11/17/13 0023)   PRN Meds:.sodium chloride, HYDROcodone-acetaminophen, morphine injection, naLOXone (NARCAN)  injection, ondansetron (ZOFRAN) IV, ondansetron, sodium chloride  Assessment/Plan:   1. Acute on chronic primarily diastolic heart failure with history of prominent RV failure requiring milrinone in the past-in July of 2015, ejection fraction 55%. Volume overloaded. Creatinine decreased from 2.4 down to 1.78 with diuresis. Weight 191 pounds down from 195 pounds. Continue with IV Lasix today. Daughter in agreement.  2. Disposition-daughter states that she will need to take her home with her. They cannot afford the rehabilitation facility since she has been there already twice this year and it is $160 a day.  3. Paroxysmal Atrial fibrillation-she has had cardioversion and more recently Maze procedure. Maintaining sinus  rhythm/sinus bradycardia. Amiodarone, low-dose.  4. Chronic anticoagulation-currently on heparin bridge while Coumadin was held for kyphoplasty. I believe kyphoplasty was performed on 11/16/13 by Dr. Estanislado Pandy which states s/p L1 VP. We will resume Coumadin since she had the procedure done yesterday. Decision previously was made to bridge with heparin. This is reasonable.  She is on Coumadin for atrial fibrillation. She does not have a mechanical valve.  5. Delirium-improved. There still some baseline confusion.  6. Urinary tract infection-ceftriaxone. Per primary team.  7. Chronic kidney disease stage III/4. Baseline.   [name] [name], [place] 11/17/2013, 12:10 PM

## 2013-11-17 NOTE — Evaluation (Signed)
Physical Therapy Evaluation Patient Details Name: [name] [name] MRN: [id number] DOB: 03-05-1941 Today's Date: 11/17/2013   History of Present Illness  Pt is a morbidly obese female who sustained a vertebral fx at T12-L1 on 11-11-13.  She has limited mobility, ambulates with a 4 wheeled walker short distances in the home.  She has had multiple admissions this year for CHF and was recently discharged from the [facility] [place].  She lives alone with very close family supervision and assist.   Clinical Impression  Patient s/p vertebroplasty POD 1 presents with generalized weakness and fear of falling limiting safe mobility. Education provided on back precautions however pt with impaired short term memory and unable to recall precautions within session. Provided handout as reminder. Education provided on lumbar corset brace and how to donn/doff however pt unable to comprehend how to adjust/donn brace without total A. Pt's pain improved today. Pt not safe to return home alone. Pt would benefit from acute PT to improve transfers, gait, balance and overall mobility so pt can maximize independence and ease burden of care prior to return home.    Follow Up Recommendations SNF;Supervision/Assistance - 24 hour    Equipment Recommendations   (defer to SNF.)    Recommendations for Other Services OT consult     Precautions / Restrictions Precautions Precautions: Back Precaution Booklet Issued: Yes (comment) Precaution Comments: Gave back precautions handout. Required Braces or Orthoses: Other Brace/Splint Other Brace/Splint: lumbar corset should be worn whenever out of bed Restrictions Weight Bearing Restrictions: No      Mobility  Bed Mobility Overal bed mobility: Needs Assistance;+2 for physical assistance Bed Mobility: Rolling;Sidelying to Sit Rolling: Max assist;+2 for physical assistance Sidelying to sit: Max assist;+2 for physical assistance       General bed mobility comments: Requires step  by step instructions to perform log roll technique, use of rail and max cues and repetition to elevate trunk and mobilize BLEs to EOB. "Wait, go slow, go slow"  Transfers Overall transfer level: Needs assistance Equipment used: Rolling walker (2 wheeled) Transfers: Sit to/from Stand Sit to Stand: Mod assist;+2 physical assistance         General transfer comment: Requires constant cues for hand placement, anterior weight shift and encouragment to stand. Increased time prior to standing, "wait..wait."  Ambulation/Gait Ambulation/Gait assistance: Min assist Ambulation Distance (Feet): 4 Feet Assistive device: Rolling walker (2 wheeled) Gait Pattern/deviations: Step-through pattern;Decreased stride length Gait velocity: decreased   General Gait Details: Ambulated a few feet to the chair. Requires constant verbal cues for sequencing of steps to turn, "left foot back, right foot back" and pt repeats as performing steps. Min A negotiating RW.   Stairs            Wheelchair Mobility    Modified Rankin (Stroke Patients Only)       Balance Overall balance assessment: Needs assistance   Sitting balance-[name] Scale: Fair Sitting balance - Comments: Requires Min A at times to maintain balance sitting EOB due to posterior trunk lean. Able to self correct x1 with cues for anterior weight shift. Total A donning corset. Attempted to have patient pull strings to tighten however unable to comprehend. Postural control: Posterior lean   Standing balance-[name] Scale: Poor Standing balance comment: Requires BUE support on RW for safety due to balance deficits/weakness.                             Pertinent Vitals/Pain Pain Assessment: No/denies  pain    Home Living Family/patient expects to be discharged to:: Private residence Living Arrangements: Alone (pt reports her daughter will be able to move in with her.) Available Help at Discharge: Family;Available  PRN/intermittently Type of Home: House Home Access: Ramped entrance     Home Layout: One level Home Equipment: Walker - 2 wheels;Shower seat;Wheelchair - Psychologist, educational      Prior Function Level of Independence: Independent with assistive device(s)         Comments: Family fixes meals, visits, helps with housework and shopping     Hand Dominance   Dominant Hand: Right    Extremity/Trunk Assessment   Upper Extremity Assessment: Generalized weakness           Lower Extremity Assessment: Generalized weakness         Communication   Communication: No difficulties  Cognition Arousal/Alertness: Awake/alert Behavior During Therapy: WFL for tasks assessed/performed Overall Cognitive Status: History of cognitive impairments - at baseline       Memory: Decreased short-term memory (not able to recall back precautions within session. )              General Comments General comments (skin integrity, edema, etc.): Surgical site- clean, dry and intact. Pt on 02 throughout re-eval- Sa02 88-93%.     Exercises General Exercises - Lower Extremity Ankle Circles/Pumps: Both;10 reps;Seated Long Arc Quad: Both;10 reps;Seated Hip ABduction/ADduction: Both;10 reps;Seated      Assessment/Plan    PT Assessment Patient needs continued PT services  PT Diagnosis Generalized weakness   PT Problem List Decreased strength;Cardiopulmonary status limiting activity;Decreased activity tolerance;Decreased knowledge of use of DME;Decreased safety awareness;Decreased balance;Decreased mobility;Decreased knowledge of precautions;Decreased skin integrity  PT Treatment Interventions Balance training;Gait training;Patient/family education;Functional mobility training;Therapeutic activities;Therapeutic exercise   PT Goals (Current goals can be found in the Care Plan section) Acute Rehab PT Goals Patient Stated Goal: to get back home PT Goal Formulation: With patient Time For  Goal Achievement: 12/01/13 Potential to Achieve Goals: Good    Frequency Min 5X/week   Barriers to discharge Decreased caregiver support      Co-evaluation               End of Session Equipment Utilized During Treatment: Oxygen;Gait belt Activity Tolerance: Patient tolerated treatment well Patient left: in chair;with call bell/phone within reach;with family/visitor present (Instructed RN tech on transfer technique to assist returning pt to bed.) Nurse Communication: Mobility status         Time: 1000-1032 PT Time Calculation (min): 32 min   Charges:   PT Evaluation $PT Re-evaluation: 1 Procedure PT Treatments $Therapeutic Activity: 8-22 mins   PT G CodesCandy Sledge A 11/17/2013, 11:43 AM Candy Sledge, PT, DPT [phone]

## 2013-11-17 NOTE — Progress Notes (Signed)
[facility] PROGRESS NOTE  URI COVEY RSW:[id number] DOB: [birth year] DOA: 11/11/2013 PCP: [name], MD  Assessment/Plan: 1. T12/L1 fracture -Patient having a fall in the community, resulted in severe back pain. A CT scan of lumbar thoracic spine performed on 11/11/2013 revealed an acute fracture through the anterior aspect of the T12 and L1 vertebral bodies. -She was transferred to [facility] for evaluation by interventional radiology regarding kyphoplasty. -Interventional radiology consulted, undergoing vertebroplasty of L1 compression fracture on 11/16/2013 -On 11/17/2013 patient reported feeling better with significant improvement to patent  2. History of chronic combined systolic and diastolic congestive heart failure -Patient overloaded, was started on Lasix 80 mg IV twice a day. -Cardiology following -Has a net negative fluid balance of 2.79, current weight of 86.6 kg, down from 88.5 kg on 11/13/2013  3.  History of paroxysmal atrial fibrillation -Patient undergoing maze procedure in past. She is maintained on amiodarone 100 mg daily -Currently rate controlled -Transitioning to oral anticoagulation  4. Urinary tract infection -UA showing presence of bacteria, leukocyte esterase and nitrates. -Urine culture drawn on 11/14/2013 showing greater than 100,000 colony-forming units of gram-negative rods, urine cultures growing greater than 100,000 colony-forming units of Citrobacter species. -Will stop IV ceftriaxone, start ciprofloxacin based on organism susceptibility  5. Acute encephalopathy -Improved over the last 24 hours, she is certainly more awake and alert today -I suspect encephalopathy secondary to multiple factors including underlying infectious process, pain, hospitalization, and narcotic analgesics -Continue to provide supportive care  Code Status: DO NOT RESUSCITATE Family Communication: Spoke to her daughter present at bedside Disposition  Plan:    Consultants:  Cardiology  Interventional radiology   Antibiotics:  Ceftriaxone 1 g IV every 24 hours, stopped on 11/17/2013  Ciprofloxacin started on 11/17/2013  HPI/Subjective: Patient is a pleasant 73 year old female having multiple comorbidities including combined systolic and diastolic congestive heart failure, having a fall in clusters with intense back pain. She was initially admitted to [facility], found to have a T12/L1 fracture. She had been on anticoagulation in the outpatient setting, given vitamin K with INR now trending down to 1.46 on a.m. lab work. She was transferred to [facility] to undergo kyphoplasty by interventional radiology.  Patient improved significantly over the past 24 hours, reports less pain, is more awake and alert, tolerating by mouth intake  Objective: Filed Vitals:   11/17/13 0928  BP: 142/47  Pulse: 61  Temp:   Resp:     Intake/Output Summary (Last 24 hours) at 11/17/13 1609 Last data filed at 11/17/13 1400  Gross per 24 hour  Intake    480 ml  Output   1600 ml  Net  -1120 ml   Filed Weights   11/15/13 0637 11/16/13 0602 11/17/13 0447  Weight: 85.911 kg (189 lb 6.4 oz) 87.544 kg (193 lb) 86.637 kg (191 lb)    Exam:   General:  Patient is awake, she seems more alert and oriented on today's evaluation  Cardiovascular: Regular rate and rhythm normal S1-S2  Respiratory: Normal respiratory effort, have a few by basilar crackles, on supplemental oxygen  Abdomen: Soft nontender nondistended  Musculoskeletal: Has 1+ bilateral extremity pitting edema  Data Reviewed: Basic Metabolic Panel:  Recent Labs Lab 11/13/13 0536 11/14/13 0547 11/15/13 0735 11/16/13 0400 11/17/13 0600  NA 139 139  139 137 143 144  K 4.2 4.7  4.7 5.0 4.4 4.2  CL 99 97  97 96 99 99  CO2 31 29  30 27  30  29  GLUCOSE 106* 105*  104* 118* 90 92  BUN 26* 34*  34* 40* 42* 50*  CREATININE 2.59* 2.82*  2.83* 2.42* 1.99* 1.78*   CALCIUM 9.5 9.4  9.4 9.3 9.6 9.8  MG 2.2  --   --   --   --   PHOS  --  5.5*  --   --   --    Liver Function Tests:  Recent Labs Lab 11/13/13 0536 11/14/13 0547  AST 24  --   ALT 11  --   ALKPHOS 92  --   BILITOT 0.5  --   PROT 7.2  --   ALBUMIN 3.1* 3.2*   No results found for this basename: LIPASE, AMYLASE,  in the last 168 hours No results found for this basename: AMMONIA,  in the last 168 hours CBC:  Recent Labs Lab 11/11/13 1831 11/12/13 0527 11/14/13 0547 11/15/13 0735 11/16/13 0400 11/17/13 0600  WBC 5.4 5.4 8.0 5.8 5.6 4.6  NEUTROABS 3.2  --  5.6  --   --   --   HGB 8.7* 8.3* 8.0* 8.3* 7.5* 7.4*  HCT 27.7* 28.5* 26.3* 26.4* 24.7* 24.6*  MCV 89.9 94.4 92.6 92.6 89.5 90.4  PLT 244 214 241 223 262 253   Cardiac Enzymes: No results found for this basename: CKTOTAL, CKMB, CKMBINDEX, TROPONINI,  in the last 168 hours BNP (last 3 results)  Recent Labs  08/28/13 1418 10/02/13 1134 10/23/13 0806  PROBNP 1518.0* 2909.0* 1867.0*   CBG: No results found for this basename: GLUCAP,  in the last 168 hours  Recent Results (from the past 240 hour(s))  URINE CULTURE     Status: None   Collection Time    11/14/13 11:34 PM      Result Value Ref Range Status   Specimen Description URINE, CATHETERIZED   Final   Special Requests NONE   Final   Culture  Setup Time     Final   Value: 11/15/2013 04:21     Performed at [facility]     Final   Value: >=100,000 COLONIES/ML     Performed at [facility]   Culture     Final   Value: CITROBACTER FREUNDII     Performed at [facility]   Report Status 11/17/2013 FINAL   Final   Organism ID, Bacteria CITROBACTER FREUNDII   Final     Studies: Ct Head Wo Contrast  11/15/2013   CLINICAL DATA:  Left facial droop and slurred speech.  EXAM: CT HEAD WITHOUT CONTRAST  TECHNIQUE: Contiguous axial images were obtained from the base of the skull through the vertex without intravenous  contrast.  COMPARISON:  11/11/2013  FINDINGS: Diffuse cerebral atrophy. Ventricular dilatation consistent with central atrophy. Low-attenuation changes in the deep white matter consistent with small vessel ischemia. Old lacune or infarcts in the periventricular white matter and basal ganglia bilaterally. No significant change since previous study. No mass effect or midline shift. No abnormal extra-axial fluid collections. Gray-white matter junctions are distinct. Basal cisterns are not effaced. No depressed skull fractures. Visualized paranasal sinuses and mastoid air cells are not opacified.  IMPRESSION: No acute intracranial abnormalities. Chronic atrophy and small vessel ischemic changes. Old lacune or infarct.   Electronically Signed   By: Lucienne Capers M.D.   On: 11/15/2013 23:25   Ir Vertebroplasty Lumobsacral Inj  11/16/2013   CLINICAL DATA:  Patient with severely painful compression fracture at L1.  EXAM:  IR VERTEBROPLASTY LUMBOSACRAL INJ  MEDICATIONS: Versed 1 mg IV, Fentanyl 25 mcg IV.  ANESTHESIA/SEDATION: Total Moderate Sedation Time:  50 min.  FLUOROSCOPY TIME:  16.8 minutes.  PROCEDURE: Following a full explanation of the procedure along with the potential associated complications, an informed witnessed consent was obtained.  The patient was placed prone on the fluoroscopic table. Nasal oxygen was administered. Physiologic monitoring was performed throughout the duration of the procedure. The skin overlying the thoracolumbar region was prepped and draped in the usual sterile fashion. The L1 vertebral body was identified and the right pedicle was infiltrated with 0.25% Bupivacaine. This was then followed by the advancement of a 13-gauge Cook needle through the right pedicle into the anterior one-third at L1. A gentle contrast injection demonstrated a trabecular pattern of contrast with early opacification of a paraspinous veins. This necessitated the use of Gel-Foam pledgets through the 13 gauge  Cook spinal needle prior to the injection of methylmethacrylate mixture.  At this time, methylmethacrylate mixture was reconstituted. Under biplane intermittent fluoroscopy, the methylmethacrylate was then injected into the L1 vertebral body with filling of the vertebral body extension of the methylmethacrylate mixture seen extending into the fracture cleft between T12 and L1.  No extravasation was noted into the disk spaces or posteriorly into the spinal canal. No epidural venous contamination was seen.  The needle was then removed. Hemostasis was achieved at the skin entry site.  There were no acute complications. The patient tolerated the procedure well. The patient was then returned to her floor in the in stable condition.  IMPRESSION: Status post vertebral body augmentation for painful compression fracture at L1 using vertebroplasty technique.   Electronically Signed   By: Luanne Bras M.D.   On: 11/16/2013 15:19    Scheduled Meds: . amiodarone  100 mg Oral Daily  . antiseptic oral rinse  7 mL Mouth Rinse BID  . ciprofloxacin  250 mg Oral Q breakfast  . colchicine  0.3 mg Oral Daily  . furosemide  80 mg Intravenous BID  . oxybutynin  5 mg Oral QHS  . sodium chloride  3 mL Intravenous Q12H  . warfarin  3 mg Oral ONCE-1800  . warfarin  1 each Does not apply Once  . Warfarin - Pharmacist Dosing Inpatient   Does not apply q1800   Continuous Infusions: . heparin 1,500 Units/hr (11/17/13 1541)    Principal Problem:   Lumbar vertebral fracture Active Problems:   Juvenile rheumatic fever   Chronic kidney disease   Paroxysmal atrial fibrillation   S/P MVR (mitral valve replacement)   Chronic systolic heart failure   Long term (current) use of anticoagulants   Anemia in chronic kidney disease(285.21)   DNR (do not resuscitate)   Fall at home   Fx lumbar vertebra-closed   Acute respiratory failure with hypoxia    Time spent: 25 minutes    Kelvin Cellar  [facility] Pager [phone]. If 7PM-7AM, please contact night-coverage at [email or web address], password [facility] 11/17/2013, 4:09 PM  LOS: 6 days

## 2013-11-18 DIAGNOSIS — I10 Essential (primary) hypertension: Secondary | ICD-10-CM

## 2013-11-18 LAB — BASIC METABOLIC PANEL
Anion gap: 11 (ref 5–15)
BUN: 50 mg/dL — ABNORMAL HIGH (ref 6–23)
CALCIUM: 9.7 mg/dL (ref 8.4–10.5)
CO2: 33 meq/L — AB (ref 19–32)
CREATININE: 1.63 mg/dL — AB (ref 0.50–1.10)
Chloride: 95 mEq/L — ABNORMAL LOW (ref 96–112)
GFR calc Af Amer: 35 mL/min — ABNORMAL LOW (ref >90)
GFR calc non Af Amer: 30 mL/min — ABNORMAL LOW (ref >90)
GLUCOSE: 99 mg/dL (ref 70–99)
Potassium: 3.7 mEq/L (ref 3.7–5.3)
SODIUM: 139 meq/L (ref 137–147)

## 2013-11-18 LAB — PROTIME-INR
INR: 1.46 (ref 0.00–1.49)
Prothrombin Time: 17.7 seconds — ABNORMAL HIGH (ref 11.6–15.2)

## 2013-11-18 LAB — CBC
HEMATOCRIT: 23.8 % — AB (ref 36.0–46.0)
HEMOGLOBIN: 7.1 g/dL — AB (ref 12.0–15.0)
MCH: 27 pg (ref 26.0–34.0)
MCHC: 29.8 g/dL — AB (ref 30.0–36.0)
MCV: 90.5 fL (ref 78.0–100.0)
Platelets: 260 10*3/uL (ref 150–400)
RBC: 2.63 MIL/uL — AB (ref 3.87–5.11)
RDW: 18.1 % — ABNORMAL HIGH (ref 11.5–15.5)
WBC: 5.4 10*3/uL (ref 4.0–10.5)

## 2013-11-18 LAB — HEPARIN LEVEL (UNFRACTIONATED)
HEPARIN UNFRACTIONATED: 0.77 [IU]/mL — AB (ref 0.30–0.70)
Heparin Unfractionated: 0.98 IU/mL — ABNORMAL HIGH (ref 0.30–0.70)

## 2013-11-18 MED ORDER — METOLAZONE 5 MG PO TABS
5.0000 mg | ORAL_TABLET | Freq: Once | ORAL | Status: AC
Start: 2013-11-18 — End: 2013-11-18
  Administered 2013-11-18: 5 mg via ORAL
  Filled 2013-11-18 (×2): qty 1

## 2013-11-18 MED ORDER — CIPROFLOXACIN HCL 250 MG PO TABS
250.0000 mg | ORAL_TABLET | Freq: Two times a day (BID) | ORAL | Status: DC
  Administered 2013-11-18 – 2013-11-21 (×6): 250 mg via ORAL
  Filled 2013-11-18 (×8): qty 1

## 2013-11-18 MED ORDER — HEPARIN (PORCINE) IN NACL 100-0.45 UNIT/ML-% IJ SOLN
1100.0000 [IU]/h | INTRAMUSCULAR | Status: DC
  Administered 2013-11-18: 1300 [IU]/h via INTRAVENOUS
  Administered 2013-11-19 – 2013-11-21 (×2): 1100 [IU]/h via INTRAVENOUS
  Filled 2013-11-18 (×6): qty 250

## 2013-11-18 MED ORDER — WARFARIN SODIUM 3 MG PO TABS
3.0000 mg | ORAL_TABLET | Freq: Once | ORAL | Status: AC
  Administered 2013-11-18: 3 mg via ORAL
  Filled 2013-11-18: qty 1

## 2013-11-18 NOTE — Progress Notes (Signed)
[facility] PROGRESS NOTE  TESSA SEABERRY ZOX:[id number] DOB: [birth year] DOA: 11/11/2013 PCP: [name], MD  Assessment/Plan: 1. T12/L1 fracture -Patient having a fall in the community, resulted in severe back pain. A CT scan of lumbar thoracic spine performed on 11/11/2013 revealed an acute fracture through the anterior aspect of the T12 and L1 vertebral bodies. -She was transferred to [facility] for evaluation by interventional radiology regarding kyphoplasty. -Interventional radiology consulted, undergoing vertebroplasty of L1 compression fracture on 11/16/2013 -On 11/18/2013 patient reported feeling better with significant improvement to patent  2. History of chronic combined systolic and diastolic congestive heart failure -Patient overloaded, was started on Lasix 80 mg IV twice a day. -Cardiology following -Has a net negative fluid balance of 2.79, current weight of 87.5 kg, down from 88.5 kg on 11/13/2013 -Metolazone 5 mg PO q daily added on 11/18/2013, continue IV lasix  3.  History of paroxysmal atrial fibrillation -Patient undergoing maze procedure in past. She is maintained on amiodarone 100 mg daily -Currently rate controlled -Transitioned to back to oral warfarin  4. Urinary tract infection -UA showing presence of bacteria, leukocyte esterase and nitrates. -Urine culture drawn on 11/14/2013 showing greater than 100,000 colony-forming units of gram-negative rods, urine cultures growing greater than 100,000 colony-forming units of Citrobacter species. -Will stop IV ceftriaxone, started ciprofloxacin based on organism susceptibility  5. Acute encephalopathy -Improved over the last 24 hours, she is certainly more awake and alert today -I suspect encephalopathy secondary to multiple factors including underlying infectious process, pain, hospitalization, and narcotic analgesics -Continue to provide supportive care  Code Status: DO NOT RESUSCITATE Family  Communication: Spoke to her daughter present at bedside Disposition Plan: Anticipate discharge to SNF in the next 24-48 hours   Consultants:  Cardiology  Interventional radiology   Antibiotics:  Ceftriaxone 1 g IV every 24 hours, stopped on 11/17/2013  Ciprofloxacin started on 11/17/2013  HPI/Subjective: Patient is a pleasant 73 year old female having multiple comorbidities including combined systolic and diastolic congestive heart failure, having a fall in clusters with intense back pain. She was initially admitted to [facility], found to have a T12/L1 fracture. She had been on anticoagulation in the outpatient setting, given vitamin K with INR now trending down to 1.46 on a.m. lab work. She was transferred to [facility] to undergo kyphoplasty by interventional radiology.  Patient improved significantly over the past 24 hours, reports less pain, is more awake and alert, tolerating by mouth intake  Objective: Filed Vitals:   11/18/13 1324  BP: 124/51  Pulse: 59  Temp: 98.2 F (36.8 C)  Resp: 15    Intake/Output Summary (Last 24 hours) at 11/18/13 1719 Last data filed at 11/18/13 1626  Gross per 24 hour  Intake   1374 ml  Output   1550 ml  Net   -176 ml   Filed Weights   11/16/13 0602 11/17/13 0447 11/18/13 0528  Weight: 87.544 kg (193 lb) 86.637 kg (191 lb) 87.544 kg (193 lb)    Exam:   General:  Patient is awake, she seems more alert and oriented on today's evaluation  Cardiovascular: Regular rate and rhythm normal S1-S2  Respiratory: Normal respiratory effort, have a few by basilar crackles, on supplemental oxygen  Abdomen: Soft nontender nondistended  Musculoskeletal: Has 1+ bilateral extremity pitting edema  Data Reviewed: Basic Metabolic Panel:  Recent Labs Lab 11/13/13 0536 11/14/13 0547 11/15/13 0735 11/16/13 0400 11/17/13 0600 11/18/13 0635  NA 139 139  139 137 143 144 139  K 4.2 4.7  4.7 5.0 4.4 4.2 3.7  CL 99 97  97  96 99 99 95*  CO2 31 29  30 27 30 29  33*  GLUCOSE 106* 105*  104* 118* 90 92 99  BUN 26* 34*  34* 40* 42* 50* 50*  CREATININE 2.59* 2.82*  2.83* 2.42* 1.99* 1.78* 1.63*  CALCIUM 9.5 9.4  9.4 9.3 9.6 9.8 9.7  MG 2.2  --   --   --   --   --   PHOS  --  5.5*  --   --   --   --    Liver Function Tests:  Recent Labs Lab 11/13/13 0536 11/14/13 0547  AST 24  --   ALT 11  --   ALKPHOS 92  --   BILITOT 0.5  --   PROT 7.2  --   ALBUMIN 3.1* 3.2*   No results found for this basename: LIPASE, AMYLASE,  in the last 168 hours No results found for this basename: AMMONIA,  in the last 168 hours CBC:  Recent Labs Lab 11/11/13 1831  11/14/13 0547 11/15/13 0735 11/16/13 0400 11/17/13 0600 11/18/13 0635  WBC 5.4  < > 8.0 5.8 5.6 4.6 5.4  NEUTROABS 3.2  --  5.6  --   --   --   --   HGB 8.7*  < > 8.0* 8.3* 7.5* 7.4* 7.1*  HCT 27.7*  < > 26.3* 26.4* 24.7* 24.6* 23.8*  MCV 89.9  < > 92.6 92.6 89.5 90.4 90.5  PLT 244  < > 241 223 262 253 260  < > = values in this interval not displayed. Cardiac Enzymes: No results found for this basename: CKTOTAL, CKMB, CKMBINDEX, TROPONINI,  in the last 168 hours BNP (last 3 results)  Recent Labs  08/28/13 1418 10/02/13 1134 10/23/13 0806  PROBNP 1518.0* 2909.0* 1867.0*   CBG: No results found for this basename: GLUCAP,  in the last 168 hours  Recent Results (from the past 240 hour(s))  URINE CULTURE     Status: None   Collection Time    11/14/13 11:34 PM      Result Value Ref Range Status   Specimen Description URINE, CATHETERIZED   Final   Special Requests NONE   Final   Culture  Setup Time     Final   Value: 11/15/2013 04:21     Performed at [facility]     Final   Value: >=100,000 COLONIES/ML     Performed at [facility]   Culture     Final   Value: CITROBACTER FREUNDII     Performed at [facility]   Report Status 11/17/2013 FINAL   Final   Organism ID, Bacteria CITROBACTER FREUNDII    Final     Studies: No results found.  Scheduled Meds: . amiodarone  100 mg Oral Daily  . antiseptic oral rinse  7 mL Mouth Rinse BID  . ciprofloxacin  250 mg Oral BID  . colchicine  0.3 mg Oral Daily  . furosemide  80 mg Intravenous BID  . oxybutynin  5 mg Oral QHS  . sodium chloride  3 mL Intravenous Q12H  . warfarin  3 mg Oral ONCE-1800  . Warfarin - Pharmacist Dosing Inpatient   Does not apply q1800   Continuous Infusions: . 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 If 7PM-7AM, please contact night-coverage at [email or web address], password [facility] 11/18/2013, 5:19 PM  LOS: 7 days

## 2013-11-18 NOTE — Progress Notes (Signed)
ANTICOAGULATION CONSULT NOTE - Follow Up Consult  Pharmacy Consult for Heparin and Coumadin Indication: atrial fibrillation  Allergies  Allergen Reactions  . Indomethacin Other (See Comments)    dizziness  . Norvasc [Amlodipine Besylate] Cough    Patient Measurements: Height: 5\' 2"  (157.5 cm) Weight: 193 lb (87.544 kg) IBW/kg (Calculated) : 50.1 Heparin Dosing Weight:   Vital Signs: Temp: 98.2 F (36.8 C) (09/13 1324) Temp src: Oral (09/13 1324) BP: 124/51 mmHg (09/13 1324) Pulse Rate: 59 (09/13 1324)  Labs:  Recent Labs  11/16/13 0400 11/17/13 0600 11/17/13 0815 11/18/13 0500 11/18/13 0635  HGB 7.5* 7.4*  --   --  7.1*  HCT 24.7* 24.6*  --   --  23.8*  PLT 262 253  --   --  260  LABPROT  --   --   --   --  17.7*  INR  --   --   --   --  1.46  HEPARINUNFRC 0.41  --  0.55 0.98*  --   CREATININE 1.99* 1.78*  --   --  1.63*    Estimated Creatinine Clearance: 32.1 ml/min (by C-G formula based on Cr of 1.63).   Medications:  Scheduled:  . amiodarone  100 mg Oral Daily  . antiseptic oral rinse  7 mL Mouth Rinse BID  . ciprofloxacin  250 mg Oral BID  . colchicine  0.3 mg Oral Daily  . furosemide  80 mg Intravenous BID  . metolazone  5 mg Oral Once  . oxybutynin  5 mg Oral QHS  . sodium chloride  3 mL Intravenous Q12H  . warfarin  3 mg Oral ONCE-1800  . Warfarin - Pharmacist Dosing Inpatient   Does not apply q1800    Assessment: 73yo female with AFib, s/p Kyphoplasty on 9/11.  Coumadin resumed with Heparin bridge.  Heparin level high this AM at 0.98, INR 1.46, Hg 7.1- stable but a slow downward trend, pltc wnl.  No bleeding noted.  Of note, Cr continues to improve- will adjust Cipro for renal fxn.  Goal of Therapy:  INR 2-3 Heparin level 0.3-0.7 units/ml Monitor platelets by anticoagulation protocol: Yes   Plan:  1- Hold Heparin x 1h, then dec to 1300 units/hr. HL 6hr after resume. 2- Repeat Coumadin 3mg  today 3- Change Cipro to 250 bid d/t improved  renal fxn, cont to watch 4- Daily INR 5- D/C hep when INR > 2   Gracy Bruins, [facility]

## 2013-11-18 NOTE — Progress Notes (Signed)
Subjective:   [name] [name] is a 73 yo obese white female with known history of rheumatic fever during childhood and long standing history of chronic diastolic congestive heart failure. She also has a history of CKD, HTN , CVA 2003,, and PAF. Recently treated for Strep viridans bacteremia: no vegetation seen on TTE that required 10 day course of antibiotics.   Progressed to severe symptomatic mitral regurgitation with paroxysmal atrial fibrillation and underwent minimally invasive Maze procedure with MV replacement (05/16/13).   Initially admitted to [facility] after fall. Fracture at the T12-L1 region through an area of prior fusion. Neurosurgery recommended a back brace . Neuro Interventional radiology was consulted for kyphoplasty/Vertebroplasty.  9/9 she was transferred from [facility] to [facility]. She had kyphoplasty performed on 11/16/13.  She is currently feeling better. Less short of breath, less back pain.  Objective:  Vital Signs in the last 24 hours: Temp:  [97.2 F (36.2 C)-98.5 F (36.9 C)] 97.6 F (36.4 C) (09/13 1025) Pulse Rate:  [58-62] 58 (09/13 0528) Resp:  [16-18] 16 (09/13 1025) BP: (117-143)/(40-55) 129/46 mmHg (09/13 1025) SpO2:  [99 %-100 %] 100 % (09/13 1025) Weight:  [193 lb (87.544 kg)] 193 lb (87.544 kg) (09/13 0528)  Intake/Output from previous day: 09/12 0701 - 09/13 0700 In: 1374 [P.O.:580; I.V.:794] Out: 1050 [Urine:1050]   Physical Exam: General: NAD. No resp difficulty. Lying flat in bed. Marland Kitchen  HEENT: normal  Neck: supple. JVD 8-9 cm (difficult). Carotids 2+ bilaterally; no bruits. No lymphadenopathy or thryomegaly appreciated.  Cor:  regular rate and rhythm. No rubs, gallops or murmurs.  Lungs: Slight crackles at bases bilaterally.  Abdomen: soft, nontender, nondistended. No hepatosplenomegaly. No bruits or masses. Good bowel sounds. Obese Extremities: no cyanosis, clubbing, rash, R and LLE 1+ edema to knees.  Neuro: Alert to self. Pleasantly confused. Moves all 4  extremities w/o difficulty. Affect pleasant.  GU: Foley in place.      Lab Results:  Recent Labs  11/17/13 0600 11/18/13 0635  WBC 4.6 5.4  HGB 7.4* 7.1*  PLT 253 260    Recent Labs  11/17/13 0600 11/18/13 0635  NA 144 139  K 4.2 3.7  CL 99 95*  CO2 29 33*  GLUCOSE 92 99  BUN 50* 50*  CREATININE 1.78* 1.63*   Telemetry: Sinus bradycardia 56 Personally viewed.   EKG:  Last EKG July 29-sinus rhythm. First degree AV block.  Scheduled Meds: . amiodarone  100 mg Oral Daily  . antiseptic oral rinse  7 mL Mouth Rinse BID  . ciprofloxacin  250 mg Oral Q breakfast  . colchicine  0.3 mg Oral Daily  . furosemide  80 mg Intravenous BID  . oxybutynin  5 mg Oral QHS  . sodium chloride  3 mL Intravenous Q12H  . Warfarin - Pharmacist Dosing Inpatient   Does not apply q1800   Continuous Infusions: . heparin 1,500 Units/hr (11/18/13 0909)   PRN Meds:.sodium chloride, HYDROcodone-acetaminophen, morphine injection, naLOXone (NARCAN)  injection, ondansetron (ZOFRAN) IV, ondansetron, sodium chloride  Assessment/Plan:   1. Acute on chronic primarily diastolic heart failure with history of prominent RV failure requiring milrinone in the past-in July of 2015, ejection fraction 55%. Improved. Creatinine decreased from 2.4 down to 1.6 with diuresis. Continue with IV Lasix today. Decreased diuresis yesterday. I will add 5 mg of metolazone. Tomorrow, possible change back to by mouth Demadex  2. Disposition-daughter states that she will need to take her home with her. They cannot afford the rehabilitation facility  since she has been there already twice this year and it is $160 a day.  3. Paroxysmal Atrial fibrillation-she has had cardioversion and more recently Maze procedure. Maintaining sinus rhythm/sinus bradycardia. Amiodarone, low-dose.  4. Chronic anticoagulation-currently on heparin bridge while Coumadin was held for kyphoplasty. I believe kyphoplasty was performed on 11/16/13 by Dr.  Estanislado Pandy which states s/p L1 VP. We will resume Coumadin since she had the procedure done yesterday. Decision previously was made to bridge with heparin. This is reasonable. She is on Coumadin for atrial fibrillation. She does not have a mechanical valve. INR is currently 1.6. Therapeutic would be 2-3.   5. Delirium-improved. There still some baseline confusion.  6. Urinary tract infection-Cipro Per primary team.  7. Chronic kidney disease stage III/4. Baseline.   SKAINS, [place] 11/18/2013, 11:34 AM

## 2013-11-18 NOTE — Progress Notes (Signed)
ANTICOAGULATION CONSULT NOTE - Follow Up Consult  Pharmacy Consult for heparin Indication: atrial fibrillation  Labs:  Recent Labs  11/16/13 0400 11/17/13 0600 11/17/13 0815 11/18/13 0500 11/18/13 0635 11/18/13 2134  HGB 7.5* 7.4*  --   --  7.1*  --   HCT 24.7* 24.6*  --   --  23.8*  --   PLT 262 253  --   --  260  --   LABPROT  --   --   --   --  17.7*  --   INR  --   --   --   --  1.46  --   HEPARINUNFRC 0.41  --  0.55 0.98*  --  0.77*  CREATININE 1.99* 1.78*  --   --  1.63*  --     Assessment: 72yo female remains supratherapeutic on heparin though closer to goal.  Goal of Therapy:  Heparin level 0.3-0.7 units/ml   Plan:  Will decrease heparin gtt to 1100 units/hr and check level with am labs.  Wynona Neat, PharmD, BCPS  11/18/2013,11:01 PM

## 2013-11-19 DIAGNOSIS — N3000 Acute cystitis without hematuria: Secondary | ICD-10-CM

## 2013-11-19 LAB — CBC
HCT: 24.2 % — ABNORMAL LOW (ref 36.0–46.0)
HEMOGLOBIN: 7.3 g/dL — AB (ref 12.0–15.0)
MCH: 27.2 pg (ref 26.0–34.0)
MCHC: 30.2 g/dL (ref 30.0–36.0)
MCV: 90.3 fL (ref 78.0–100.0)
Platelets: 264 10*3/uL (ref 150–400)
RBC: 2.68 MIL/uL — AB (ref 3.87–5.11)
RDW: 17.9 % — AB (ref 11.5–15.5)
WBC: 4.9 10*3/uL (ref 4.0–10.5)

## 2013-11-19 LAB — BASIC METABOLIC PANEL
Anion gap: 12 (ref 5–15)
BUN: 47 mg/dL — AB (ref 6–23)
CHLORIDE: 92 meq/L — AB (ref 96–112)
CO2: 34 meq/L — AB (ref 19–32)
Calcium: 9.8 mg/dL (ref 8.4–10.5)
Creatinine, Ser: 1.52 mg/dL — ABNORMAL HIGH (ref 0.50–1.10)
GFR calc non Af Amer: 33 mL/min — ABNORMAL LOW (ref >90)
GFR, EST AFRICAN AMERICAN: 38 mL/min — AB (ref >90)
GLUCOSE: 120 mg/dL — AB (ref 70–99)
POTASSIUM: 3.8 meq/L (ref 3.7–5.3)
Sodium: 138 mEq/L (ref 137–147)

## 2013-11-19 LAB — PROTIME-INR
INR: 1.48 (ref 0.00–1.49)
Prothrombin Time: 17.9 seconds — ABNORMAL HIGH (ref 11.6–15.2)

## 2013-11-19 LAB — PREPARE RBC (CROSSMATCH)

## 2013-11-19 LAB — HEPARIN LEVEL (UNFRACTIONATED): Heparin Unfractionated: 0.52 IU/mL (ref 0.30–0.70)

## 2013-11-19 MED ORDER — FUROSEMIDE 10 MG/ML IJ SOLN
80.0000 mg | Freq: Three times a day (TID) | INTRAMUSCULAR | Status: DC
  Administered 2013-11-19 – 2013-11-21 (×5): 80 mg via INTRAVENOUS
  Filled 2013-11-19 (×8): qty 8

## 2013-11-19 MED ORDER — POTASSIUM CHLORIDE CRYS ER 20 MEQ PO TBCR
40.0000 meq | EXTENDED_RELEASE_TABLET | Freq: Once | ORAL | Status: AC
  Administered 2013-11-19: 40 meq via ORAL
  Filled 2013-11-19: qty 2

## 2013-11-19 MED ORDER — SODIUM CHLORIDE 0.9 % IV SOLN: Freq: Once | INTRAVENOUS | Status: DC

## 2013-11-19 MED ORDER — WARFARIN SODIUM 3 MG PO TABS
3.0000 mg | ORAL_TABLET | Freq: Once | ORAL | Status: AC
  Administered 2013-11-19: 3 mg via ORAL
  Filled 2013-11-19: qty 1

## 2013-11-19 NOTE — Progress Notes (Signed)
Handoff report had been received that patient/daughter was requesting short term SNF at [facility] by Benay Pike, [place] checked [facility] Medicare benefits. Patient has recently been d/c'd from a SNF and has exhausted her 20 full covered days for SNF. If placed-- patient will be required to pay $156.00 per day copay.  Discussed with patient and daughter- they defer desire for placement and will return home with maximum home health. CSW discussed Medicaid but daughter defers this option- stating that her mother will not be eligible.  CSW notified RNCM of above and will sign off but will be available if needed. [name]. Pauline Good, [place]

## 2013-11-19 NOTE — Progress Notes (Signed)
ANTICOAGULATION CONSULT NOTE - Follow Up Consult  Pharmacy Consult for heparin Indication: atrial fibrillation  Labs:  Recent Labs  11/17/13 0600  11/18/13 0500 11/18/13 0635 11/18/13 2134 11/19/13 0452  HGB 7.4*  --   --  7.1*  --   --   HCT 24.6*  --   --  23.8*  --   --   PLT 253  --   --  260  --   --   LABPROT  --   --   --  17.7*  --  17.9*  INR  --   --   --  1.46  --  1.48  HEPARINUNFRC  --   < > 0.98*  --  0.77* 0.52  CREATININE 1.78*  --   --  1.63*  --   --   < > = values in this interval not displayed.  Assessment: 73 yo F admitted on 11/11/2013 after fall with walker and hitting mid back and head on concrete. Citrobacter UTI 9/9, Vertebroplasty 9/11.  Pharmacy consulted to dose heparin and warfarin  PMH: COPD, TIA/CVA, h/o rheumatic fever, HTN/HLD, bioprosthetic MVR, CKDIV, PAF s/p MAZE, arthritis, HFpEF  AC/Heme:  Afib,  Hep resumed 9/11 Coum 9/12. D/C hep when INR > 2 per Dr. Marlou Porch. No bleeding issues.  HL at goal, INR remains < goal. Home dose: 2.5mg  daily except 1.25mg  on Friday (INR 2.86 on admission)  ID: UTI with UA +nitrite/leuk; Afeb, WBC wnl 9/10 CTX >> 9/12 9/12 Cipro x 5 days >> (9/17) 9/9 UCx- > 100K Citrobacter, R: cefaz. I: ctx, zosyn. S: FQs  CV: HFpEF (EF 50-55% on 10/05/13),  BP wnl, 120s, HR 50-60s, I/O 1391/1750 admit wt 183>>195 ? accurate (dry wt 175) resumed lasix 80 IV q8h, amio PO  Renal: SCr 1.63- continuing downward trend (10/2013 SCr 1.7), lytes wnl; colchicine for gout   Goal of Therapy:  Heparin level 0.3-0.7 units/ml   Plan:  Continue heparin at 1100 units/hr Warfarin 3 mg Daily INR D/C hep when INR > 2  Thank you for allowing pharmacy to be a part of this patients care team.  Rowe Robert Pharm.D., BCPS, AQ-Cardiology Clinical Pharmacist 11/19/2013 10:08 AM Pager: [phone] Phone: [phone]

## 2013-11-19 NOTE — Progress Notes (Signed)
In to follow up with patient at bedside.  [place] daughter present.  She expressed that patient is in less pain since surgery.  Her mother will move in with the patient short term to assist with ADLs. However, the daughter has orthopedic limitations that will not allow her to do lifting and pulling.  Patient is not eligible for Medicaid PCS but will have home health to include a bath aide per her insurance.  [facility] will continue to explore community resources to assist.  Will update RNCM of engagement plan.  Of note, [facility] Care Management services does not replace or interfere with any services that are arranged by inpatient case management or social work.  For additional questions or referrals please contact Corliss Blacker BSN RN [facility] Liaison at [phone].

## 2013-11-19 NOTE — Progress Notes (Signed)
[name], female   DOB: 1940/12/13, 73 y.o.   MRN: [id number]    Primary cardiologist: Dr. Carlyle Dolly Advanced CHF: Dr. [name] Consulting cardiologist: Dr. Satira Sark  Subjective:   [name] [name] is a 73 yo obese white female with known history of rheumatic fever during childhood and long standing history of chronic diastolic congestive heart failure. She also has a history of CKD, HTN , CVA 2003,, and PAF. Recently treated for Strep viridans bacteremia: no vegetation seen on TTE that required 10 day course of antibiotics.   Progressed to severe symptomatic mitral regurgitation with paroxysmal atrial fibrillation and underwent minimally invasive Maze procedure with MV replacement (05/16/13).   Initially admitted to [facility] after fall. Fracture at the T12-L1 region through an area of prior fusion. Neurosurgery recommended a back brace . Neuro Interventional radiology was consulted for kyphoplasty/Vertebroplasty.  9/9 she was transferred from [facility] to [facility].  She had vertebroplasty on 9/11.  Pain in back is better.  Has not been out of bed.   She was found to have a UTI (Citrobacter) and is being treated.    She seems pretty clear today.  Had IV Lasix + metolazone yesterday, weight done in bed and probably not accurate.    Creatinine 2.8> 2.4>1.99>1.63 Objective:   Temp:  [97.6 F (36.4 C)-98.2 F (36.8 C)] 97.9 F (36.6 C) (09/14 0543) Pulse Rate:  [58-60] 60 (09/14 0543) Resp:  [15-18] 18 (09/14 0543) BP: (124-130)/(46-58) 128/52 mmHg (09/14 0543) SpO2:  [94 %-100 %] 94 % (09/14 0543) Weight:  [195 lb (88.451 kg)] 195 lb (88.451 kg) (09/14 0543) Last BM Date: 11/11/13  Filed Weights   11/17/13 0447 11/18/13 0528 11/19/13 0543  Weight: 191 lb (86.637 kg) 193 lb (87.544 kg) 195 lb (88.451 kg)    Intake/Output Summary (Last 24 hours) at 11/19/13 0823 Last data filed at 11/19/13 0548  Gross per 24 hour  Intake 1091.63 ml  Output   1750 ml  Net  -658.37 ml   PHYSICAL EXAM:  General: NAD. No resp difficulty. Lying flat in bed. Marland Kitchen  HEENT: normal  Neck: supple. JVP ?, probably mildly elevated (difficult). Carotids 2+ bilaterally; no bruits. No lymphadenopathy or thryomegaly appreciated.  Cor: PMI normal. Regular rate & rhythm. No rubs, gallops or murmurs.  Lungs: Slight crackles at bases bilaterally.  Abdomen: soft, nontender, nondistended. No hepatosplenomegaly. No bruits or masses. Good bowel sounds.  Extremities: no cyanosis, clubbing, rash,  1+ ankle edema bilaterally.   Neuro: Alert, oriented.  Moves all 4 extremities w/o difficulty. Affect pleasant. GU: Foley in place.    Lab Results:  Basic Metabolic Panel:  Recent Labs Lab 11/13/13 0536  11/16/13 0400 11/17/13 0600 11/18/13 0635  NA 139  < > 143 144 139  K 4.2  < > 4.4 4.2 3.7  CL 99  < > 99 99 95*  CO2 31  < > 30 29 33*  GLUCOSE 106*  < > 90 92 99  BUN 26*  < > 42* 50* 50*  CREATININE 2.59*  < > 1.99* 1.78* 1.63*  CALCIUM 9.5  < > 9.6 9.8 9.7  MG 2.2  --   --   --   --   < > = values in this interval not displayed.  Liver Function Tests:  Recent Labs Lab 11/13/13 0536 11/14/13 0547  AST 24  --   ALT 11  --   ALKPHOS 92  --   BILITOT 0.5  --  PROT 7.2  --   ALBUMIN 3.1* 3.2*    CBC:  Recent Labs Lab 11/16/13 0400 11/17/13 0600 11/18/13 0635  WBC 5.6 4.6 5.4  HGB 7.5* 7.4* 7.1*  HCT 24.7* 24.6* 23.8*  MCV 89.5 90.4 90.5  PLT 262 253 260    Coagulation:  Recent Labs Lab 11/15/13 0735 11/18/13 0635 11/19/13 0452  INR 1.46 1.46 1.48     Medications:   Scheduled Medications: . amiodarone  100 mg Oral Daily  . antiseptic oral rinse  7 mL Mouth Rinse BID  . ciprofloxacin  250 mg Oral BID  . colchicine  0.3 mg Oral Daily  . furosemide  80 mg Intravenous Q8H  . oxybutynin  5 mg Oral QHS  . sodium chloride  3 mL Intravenous Q12H  . Warfarin - Pharmacist Dosing Inpatient   Does not apply q1800    Infusions: . heparin 1,100  Units/hr (11/19/13 0650)    PRN Medications: sodium chloride, HYDROcodone-acetaminophen, morphine injection, naLOXone (NARCAN)  injection, ondansetron (ZOFRAN) IV, ondansetron, sodium chloride   Assessment/Plan   1. 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 Most echo with LVEF 09/2013 EF 50-55%. Volume status difficult on exam and bed weights likely inaccurate. BUN stable, creatinine lower.  - Lasix 80 mg IV every 8 hours today, will likely transition back to po torsemide tomorrow.   2. Paroxysmal atrial fibrillation: Status post prior cardioversion and more recently Maze procedure. Maintaining NSR. On amiodarone 100 mg daily. Heparin bridge while off coumadin.  3. CKD stage 3-4: Creatinine is actually below what her baseline has been.  4. History of mitral regurgitation status post bioprosthetic MVR, stable by recent echocardiogram.  5. Moderate aortic regurgitation. 6. T12-L1 fracture in region of prior fusion, status post mechanical fall without syncope. S/p vertebroplasty, pain improved.  7. UTI: Citrobacter UTI on ciprofloxacin.   8. Delirium: Improved. Likely related to UTI, pain medications.  9. Anemia: Chronic. No active bleeding apparent.  Will need to follow closely with heparin gtt.  Transfuse hgb < 7.  Needs CBC today.  10.  She need re-evaluation by PT now that she has had vertebroplasty.  Ideally would go to rehab facility but finances are going to be a big issue.  Will involve social work.    [name] 11/19/2013 8:23 AM

## 2013-11-19 NOTE — Progress Notes (Signed)
[facility] PROGRESS NOTE  [name] [name] PFX:[id number] DOB: Oct 02, 1940 DOA: 11/11/2013 PCP: [name], MD  Assessment/Plan: 1. T12/L1 fracture -Patient having a fall in the community, resulted in severe back pain. A CT scan of lumbar thoracic spine performed on 11/11/2013 revealed an acute fracture through the anterior aspect of the T12 and L1 vertebral bodies. -She was transferred to [facility] for evaluation by interventional radiology regarding kyphoplasty. -Interventional radiology consulted, undergoing vertebroplasty of L1 compression fracture on 11/16/2013 -On 11/18/2013 patient reported feeling better with significant improvement to patent  2. History of chronic combined systolic and diastolic congestive heart failure -Patient overloaded, was started on Lasix 80 mg IV twice a day. -Cardiology following -Has a net negative fluid balance of 2.79, current weight of 87.5 kg, down from 88.5 kg on 11/13/2013 -Metolazone 5 mg PO q daily added on 11/18/2013, continue IV lasix  3.  History of paroxysmal atrial fibrillation -Patient undergoing maze procedure in past. She is maintained on amiodarone 100 mg daily -Currently rate controlled -Transitioned to back to oral warfarin  4. Urinary tract infection -UA showing presence of bacteria, leukocyte esterase and nitrates. -Urine culture drawn on 11/14/2013 showing greater than 100,000 colony-forming units of gram-negative rods, urine cultures growing greater than 100,000 colony-forming units of Citrobacter species. -Will stop IV ceftriaxone, started ciprofloxacin based on organism susceptibility  5. Acute encephalopathy -Improved over the last 24 hours, she is certainly more awake and alert today -I suspect encephalopathy secondary to multiple factors including underlying infectious process, pain, hospitalization, and narcotic analgesics -Continue to provide supportive care  6.  Acute on Chronic Anemia -Hg remains  in the low 7 range -Prior to discharge to home will transfuse 2 units of PRBC's   Code Status: DO NOT RESUSCITATE Family Communication: Spoke to her daughter present at bedside Disposition Plan: Anticipate discharge to home with home health services in the next 24 hours   Consultants:  Cardiology  Interventional radiology   Antibiotics:  Ceftriaxone 1 g IV every 24 hours, stopped on 11/17/2013  Ciprofloxacin started on 11/17/2013  HPI/Subjective: Patient is a pleasant 73 year old female having multiple comorbidities including combined systolic and diastolic congestive heart failure, having a fall in clusters with intense back pain. She was initially admitted to [facility], found to have a T12/L1 fracture. She had been on anticoagulation in the outpatient setting, given vitamin K with INR now trending down to 1.46 on a.m. lab work. She was transferred to [facility] to undergo kyphoplasty by interventional radiology.  Patient seems to be doing well, reports pain in under control, planning on discharging to her home with [facility] services.   Objective: Filed Vitals:   11/19/13 1500  BP: 115/47  Pulse: 89  Temp: 98 F (36.7 C)  Resp: 18    Intake/Output Summary (Last 24 hours) at 11/19/13 1507 Last data filed at 11/19/13 1500  Gross per 24 hour  Intake 1091.63 ml  Output   3025 ml  Net -1933.37 ml   Filed Weights   11/17/13 0447 11/18/13 0528 11/19/13 0543  Weight: 86.637 kg (191 lb) 87.544 kg (193 lb) 88.451 kg (195 lb)    Exam:   General:  Patient is awake, she seems more alert and oriented on today's evaluation  Cardiovascular: Regular rate and rhythm normal S1-S2  Respiratory: Normal respiratory effort, have a few by basilar crackles, on supplemental oxygen  Abdomen: Soft nontender nondistended  Musculoskeletal: Has 1+ bilateral extremity pitting edema  Data Reviewed: Basic Metabolic  Panel:  Recent Labs Lab 11/13/13 0536 11/14/13 0547  11/15/13 0735 11/16/13 0400 11/17/13 0600 11/18/13 0635 11/19/13 1155  NA 139 139  139 137 143 144 139 138  K 4.2 4.7  4.7 5.0 4.4 4.2 3.7 3.8  CL 99 97  97 96 99 99 95* 92*  CO2 31 29  30 27 30 29  33* 34*  GLUCOSE 106* 105*  104* 118* 90 92 99 120*  BUN 26* 34*  34* 40* 42* 50* 50* 47*  CREATININE 2.59* 2.82*  2.83* 2.42* 1.99* 1.78* 1.63* 1.52*  CALCIUM 9.5 9.4  9.4 9.3 9.6 9.8 9.7 9.8  MG 2.2  --   --   --   --   --   --   PHOS  --  5.5*  --   --   --   --   --    Liver Function Tests:  Recent Labs Lab 11/13/13 0536 11/14/13 0547  AST 24  --   ALT 11  --   ALKPHOS 92  --   BILITOT 0.5  --   PROT 7.2  --   ALBUMIN 3.1* 3.2*   No results found for this basename: LIPASE, AMYLASE,  in the last 168 hours No results found for this basename: AMMONIA,  in the last 168 hours CBC:  Recent Labs Lab 11/14/13 0547 11/15/13 0735 11/16/13 0400 11/17/13 0600 11/18/13 0635 11/19/13 1155  WBC 8.0 5.8 5.6 4.6 5.4 4.9  NEUTROABS 5.6  --   --   --   --   --   HGB 8.0* 8.3* 7.5* 7.4* 7.1* 7.3*  HCT 26.3* 26.4* 24.7* 24.6* 23.8* 24.2*  MCV 92.6 92.6 89.5 90.4 90.5 90.3  PLT 241 223 262 253 260 264   Cardiac Enzymes: No results found for this basename: CKTOTAL, CKMB, CKMBINDEX, TROPONINI,  in the last 168 hours BNP (last 3 results)  Recent Labs  08/28/13 1418 10/02/13 1134 10/23/13 0806  PROBNP 1518.0* 2909.0* 1867.0*   CBG: No results found for this basename: GLUCAP,  in the last 168 hours  Recent Results (from the past 240 hour(s))  URINE CULTURE     Status: None   Collection Time    11/14/13 11:34 PM      Result Value Ref Range Status   Specimen Description URINE, CATHETERIZED   Final   Special Requests NONE   Final   Culture  Setup Time     Final   Value: 11/15/2013 04:21     Performed at [place]     Final   Value: >=100,000 COLONIES/ML     Performed at [facility]   Culture     Final   Value: CITROBACTER  FREUNDII     Performed at [facility]   Report Status 11/17/2013 FINAL   Final   Organism ID, Bacteria CITROBACTER FREUNDII   Final     Studies: No results found.  Scheduled Meds: . sodium chloride   Intravenous Once  . amiodarone  100 mg Oral Daily  . antiseptic oral rinse  7 mL Mouth Rinse BID  . ciprofloxacin  250 mg Oral BID  . colchicine  0.3 mg Oral Daily  . furosemide  80 mg Intravenous Q8H  . oxybutynin  5 mg Oral QHS  . sodium chloride  3 mL Intravenous Q12H  . warfarin  3 mg Oral ONCE-1800  . Warfarin - Pharmacist Dosing Inpatient   Does not apply [phone]  Continuous Infusions: . heparin 1,100 Units/hr (11/19/13 0650)    Principal Problem:   Lumbar vertebral fracture Active Problems:   Juvenile rheumatic fever   Chronic kidney disease   Paroxysmal atrial fibrillation   S/P MVR (mitral valve replacement)   Chronic systolic heart failure   Long term (current) use of anticoagulants   Anemia in chronic kidney disease(285.21)   DNR (do not resuscitate)   Fall at home   Fx lumbar vertebra-closed   Acute respiratory failure with hypoxia    Time spent: 25 minutes    Kelvin Cellar  [facility] Pager [phone]. If 7PM-7AM, please contact night-coverage at [email or web address], password [facility] 11/19/2013, 3:07 PM  LOS: 8 days

## 2013-11-19 NOTE — Progress Notes (Addendum)
Physical Therapy Treatment Patient Details Name: [name] [name] MRN: [id number] DOB: [birth year] Today's Date: 11/19/2013    History of Present Illness Pt is a morbidly obese female who sustained a vertebral fx at T12-L1 on 11-11-13.  She has limited mobility, ambulates with a 4 wheeled walker short distances in the home.  She has had multiple admissions this year for CHF and was recently discharged from the [facility].  She lives alone with very close family supervision and assist.    PT Comments    Pt POD #3 s/p vertebroplasty. Pt moving better today, but needed +2 for safety as pt wants to sit abruptly and MOD of 2 from bed.   Follow Up Recommendations  SNF;Supervision/Assistance - 24 hour     Equipment Recommendations  None recommended by PT    Recommendations for Other Services       Precautions / Restrictions Precautions Precautions: Back Required Braces or Orthoses: Other Brace/Splint Other Brace/Splint: lumbar corset should be worn whenever out of bed Restrictions Weight Bearing Restrictions: No    Mobility  Bed Mobility   Bed Mobility: Rolling;Sidelying to Sit Rolling: Mod assist Sidelying to sit: Mod assist;+2 for safety/equipment       General bed mobility comments: cueing for proper form  Transfers Overall transfer level: Needs assistance Equipment used: Rolling walker (2 wheeled) Transfers: Sit to/from Stand Sit to Stand: Mod assist;+2 physical assistance         General transfer comment: Pt needed constant cueing for proper hand placement with transfer. MOD of 2 from bed and MAx of 1 from [facility].  Ambulation/Gait Ambulation/Gait assistance: Min assist Ambulation Distance (Feet): 10 Feet Assistive device: Rolling walker (2 wheeled) Gait Pattern/deviations: Trunk flexed;Decreased step length - right;Decreased step length - left Gait velocity: decreased   General Gait Details: Cueing to stay within RW and to not flex forward.   Stairs             Wheelchair Mobility    Modified Rankin (Stroke Patients Only)       Balance     Sitting balance-[name] Scale: Fair       Standing balance-[name] Scale: Fair Standing balance comment: Fair(-) with RW in static standing                    Cognition Arousal/Alertness: Awake/alert Behavior During Therapy: WFL for tasks assessed/performed Overall Cognitive Status: History of cognitive impairments - at baseline                      Exercises      General Comments        Pertinent Vitals/Pain Pain Assessment: 0-10 Pain Score: 3  Pain Location: mid/low back Pain Descriptors / Indicators: Aching Pain Intervention(s): Premedicated before session;Repositioned;Monitored during session;Limited activity within patient's tolerance    Home Living                      Prior Function            PT Goals (current goals can now be found in the care plan section) Acute Rehab PT Goals Patient Stated Goal: to get back home Time For Goal Achievement: 12/01/13 Potential to Achieve Goals: Good Progress towards PT goals: Progressing toward goals    Frequency  Min 5X/week    PT Plan Current plan remains appropriate    Co-evaluation             End of Session Equipment Utilized During  Treatment: Oxygen;Gait belt;Back brace Activity Tolerance: Patient tolerated treatment well Patient left: in chair;with call bell/phone within reach;with family/visitor present     Time: 3832-9191 PT Time Calculation (min): 40 min  Charges:  $Gait Training: 8-22 mins $Therapeutic Activity: 8-22 mins                    G Codes:      Gerry Heaphy LUBECK 11/19/2013, 2:48 PM

## 2013-11-20 ENCOUNTER — Encounter (HOSPITAL_COMMUNITY): Payer: Commercial Managed Care - HMO

## 2013-11-20 DIAGNOSIS — N183 Chronic kidney disease, stage 3 unspecified: Secondary | ICD-10-CM

## 2013-11-20 LAB — BASIC METABOLIC PANEL
ANION GAP: 10 (ref 5–15)
BUN: 42 mg/dL — ABNORMAL HIGH (ref 6–23)
CALCIUM: 9.9 mg/dL (ref 8.4–10.5)
CHLORIDE: 91 meq/L — AB (ref 96–112)
CO2: 39 mEq/L — ABNORMAL HIGH (ref 19–32)
CREATININE: 1.41 mg/dL — AB (ref 0.50–1.10)
GFR calc non Af Amer: 36 mL/min — ABNORMAL LOW (ref >90)
GFR, EST AFRICAN AMERICAN: 42 mL/min — AB (ref >90)
Glucose, Bld: 121 mg/dL — ABNORMAL HIGH (ref 70–99)
Potassium: 3.5 mEq/L — ABNORMAL LOW (ref 3.7–5.3)
Sodium: 140 mEq/L (ref 137–147)

## 2013-11-20 LAB — CBC
HCT: 34.4 % — ABNORMAL LOW (ref 36.0–46.0)
Hemoglobin: 10.9 g/dL — ABNORMAL LOW (ref 12.0–15.0)
MCH: 28.3 pg (ref 26.0–34.0)
MCHC: 31.7 g/dL (ref 30.0–36.0)
MCV: 89.4 fL (ref 78.0–100.0)
PLATELETS: 236 10*3/uL (ref 150–400)
RBC: 3.85 MIL/uL — ABNORMAL LOW (ref 3.87–5.11)
RDW: 16.6 % — AB (ref 11.5–15.5)
WBC: 6.1 10*3/uL (ref 4.0–10.5)

## 2013-11-20 LAB — PROTIME-INR
INR: 1.69 — ABNORMAL HIGH (ref 0.00–1.49)
Prothrombin Time: 19.9 seconds — ABNORMAL HIGH (ref 11.6–15.2)

## 2013-11-20 LAB — HEPARIN LEVEL (UNFRACTIONATED): Heparin Unfractionated: 0.31 IU/mL (ref 0.30–0.70)

## 2013-11-20 MED ORDER — WARFARIN SODIUM 2.5 MG PO TABS
2.5000 mg | ORAL_TABLET | Freq: Once | ORAL | Status: AC
  Administered 2013-11-20: 2.5 mg via ORAL
  Filled 2013-11-20: qty 1

## 2013-11-20 MED ORDER — POTASSIUM CHLORIDE CRYS ER 20 MEQ PO TBCR
40.0000 meq | EXTENDED_RELEASE_TABLET | Freq: Once | ORAL | Status: AC
  Administered 2013-11-20: 40 meq via ORAL
  Filled 2013-11-20: qty 2

## 2013-11-20 NOTE — Progress Notes (Signed)
[name], female   DOB: March 01, 1941, 73 y.o.   MRN: [id number]    Primary cardiologist: Dr. Carlyle Dolly Advanced CHF: Dr. [name] Consulting cardiologist: Dr. Satira Sark  Subjective:   Ms. [name] [name] is a 73 yo obese white female with known history of rheumatic fever during childhood and long standing history of chronic diastolic congestive heart failure. She also has a history of CKD, HTN , CVA 2003,, and PAF. Recently treated for Strep viridans bacteremia: no vegetation seen on TTE that required 10 day course of antibiotics.   Progressed to severe symptomatic mitral regurgitation with paroxysmal atrial fibrillation and underwent minimally invasive Maze procedure with MV replacement (05/16/13).   Initially admitted to [facility] after fall. Fracture at the T12-L1 region through an area of prior fusion. Neurosurgery recommended a back brace . Neuro Interventional radiology was consulted for kyphoplasty/Vertebroplasty.  9/9 she was transferred from [facility] to [facility].  She had vertebroplasty on 9/11. Pain in back is better.  Still very weak.   She was found to have a UTI (Citrobacter) and is being treated.    Labs not back today.  She diuresed well yesterday.  It appears that she is probably going to have to go home with home health, she does not seem to be strong enough for this yet though.    Creatinine 2.8> 2.4>1.99>1.63 Objective:   Temp:  [97.4 F (36.3 C)-98.2 F (36.8 C)] 97.7 F (36.5 C) (09/15 0602) Pulse Rate:  [58-89] 82 (09/15 0602) Resp:  [16-22] 18 (09/15 0602) BP: (101-142)/(43-70) 125/48 mmHg (09/15 0602) SpO2:  [92 %-99 %] 92 % (09/15 0602) Weight:  [179 lb (81.194 kg)] 179 lb (81.194 kg) (09/15 0602) Last BM Date: 11/19/13  Filed Weights   11/18/13 0528 11/19/13 0543 11/20/13 0602  Weight: 193 lb (87.544 kg) 195 lb (88.451 kg) 179 lb (81.194 kg)    Intake/Output Summary (Last 24 hours) at 11/20/13 0810 Last data filed at 11/20/13 0605  Gross  per 24 hour  Intake 1799.54 ml  Output   3075 ml  Net -1275.46 ml   PHYSICAL EXAM:  General: NAD. No resp difficulty. Lying flat in bed. Marland Kitchen  HEENT: normal  Neck: supple. JVP 10-12 cm, probably mildly elevated (difficult). Carotids 2+ bilaterally; no bruits. No lymphadenopathy or thryomegaly appreciated.  Cor: PMI normal. Regular rate & rhythm. No rubs, gallops or murmurs.  Lungs: Slight crackles at bases bilaterally.  Abdomen: soft, nontender, nondistended. No hepatosplenomegaly. No bruits or masses. Good bowel sounds.  Extremities: no cyanosis, clubbing, rash,  1+ ankle edema bilaterally.   Neuro: Alert, oriented.  Moves all 4 extremities w/o difficulty. Affect pleasant. GU: Foley in place.    Lab Results:  Basic Metabolic Panel:  Recent Labs Lab 11/17/13 0600 11/18/13 0635 11/19/13 1155  NA 144 139 138  K 4.2 3.7 3.8  CL 99 95* 92*  CO2 29 33* 34*  GLUCOSE 92 99 120*  BUN 50* 50* 47*  CREATININE 1.78* 1.63* 1.52*  CALCIUM 9.8 9.7 9.8    Liver Function Tests:  Recent Labs Lab 11/14/13 0547  ALBUMIN 3.2*    CBC:  Recent Labs Lab 11/17/13 0600 11/18/13 0635 11/19/13 1155  WBC 4.6 5.4 4.9  HGB 7.4* 7.1* 7.3*  HCT 24.6* 23.8* 24.2*  MCV 90.4 90.5 90.3  PLT 253 260 264    Coagulation:  Recent Labs Lab 11/15/13 0735 11/18/13 0635 11/19/13 0452  INR 1.46 1.46 1.48     Medications:   Scheduled  Medications: . sodium chloride   Intravenous Once  . amiodarone  100 mg Oral Daily  . antiseptic oral rinse  7 mL Mouth Rinse BID  . ciprofloxacin  250 mg Oral BID  . colchicine  0.3 mg Oral Daily  . furosemide  80 mg Intravenous Q8H  . oxybutynin  5 mg Oral QHS  . sodium chloride  3 mL Intravenous Q12H  . Warfarin - Pharmacist Dosing Inpatient   Does not apply q1800    Infusions: . heparin 1,100 Units/hr (11/19/13 0650)    PRN Medications: sodium chloride, HYDROcodone-acetaminophen, morphine injection, naLOXone (NARCAN)  injection, ondansetron  (ZOFRAN) IV, ondansetron, sodium chloride   Assessment/Plan   1. 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 Most echo with LVEF 09/2013 EF 50-55%. She diuresed well yesterday but JVP still up on exam. Would like to get her a bit drier as long as creatinine holds up.   - Awaiting labs today.  - Lasix 80 mg IV every 8 hours again today, delay torsemide conversion until tomorrow.    2. Paroxysmal atrial fibrillation: Status post prior cardioversion and more recently Maze procedure. Maintaining NSR. On amiodarone 100 mg daily. Heparin bridge while off coumadin.  INR not back today yet.  3. CKD stage 3-4: Pending labs this morning.   4. History of mitral regurgitation status post bioprosthetic MVR, stable by recent echocardiogram.  5. Moderate aortic regurgitation. 6. T12-L1 fracture in region of prior fusion, status post mechanical fall without syncope. S/p vertebroplasty, pain improved.  7. UTI: Citrobacter UTI on ciprofloxacin.  Foley to come out today.  8. Delirium: Improved. Likely related to UTI, pain medications.  9. Anemia: Chronic. No active bleeding apparent.  She did get 2 units PRBCs yesterday. 10.  Needs ongoing PT work.    [name] 11/20/2013 8:10 AM

## 2013-11-20 NOTE — Progress Notes (Signed)
Physical Therapy Treatment Patient Details Name: [name] [name] MRN: [id number] DOB: Jun 26, 1940 Today's Date: 11/20/2013    History of Present Illness Pt is a morbidly obese female who sustained a vertebral fx at T12-L1 on 11-11-13.  She has limited mobility, ambulates with a 4 wheeled walker short distances in the home.  She has had multiple admissions this year for CHF and was recently discharged from the [facility].  She lives alone with very close family supervision and assist.    PT Comments    Pt POD#4 s/p vertebroplasty and able to ambulate 12' and then 4' before needing to sit and get cleaned due to incontinence episode.  Once ambulating, pt moving at [facility] A with recliner follow, but MOD of 2 for bed mobility and transfers. Pt reports she sleeps in lift chair at home and has ramped entry.  Continue to recommend SNF, but pt refusing due to finances.  Follow Up Recommendations  SNF;Supervision/Assistance - 24 hour;Home health PT (Pt refusing SNF due to finances. If  home needs HHPT 24 hr S)     Equipment Recommendations  None recommended by PT    Recommendations for Other Services       Precautions / Restrictions Precautions Precautions: Back Required Braces or Orthoses: Other Brace/Splint Other Brace/Splint: lumbar corset should be worn whenever out of bed Restrictions Weight Bearing Restrictions: No    Mobility  Bed Mobility Overal bed mobility: Needs Assistance;+2 for physical assistance Bed Mobility: Rolling;Sidelying to Sit Rolling: Mod assist Sidelying to sit: Mod assist;+2 for physical assistance       General bed mobility comments: cueing for proper form  Transfers Overall transfer level: Needs assistance Equipment used: Rolling walker (2 wheeled) Transfers: Sit to/from Stand Sit to Stand: Mod assist;+2 physical assistance         General transfer comment: Stood x 3 reps and needed tactile cueing for hand placement on first rep, but did better 2nd and 3rd  reps..  Ambulation/Gait Ambulation/Gait assistance: Min assist;+2 safety/equipment Ambulation Distance (Feet): 12 Feet (and 4 ) Assistive device: Rolling walker (2 wheeled) Gait Pattern/deviations: Decreased step length - right;Decreased step length - left;Shuffle Gait velocity: decreased Gait velocity interpretation: Below normal speed for age/gender General Gait Details: Pt stood better in RW today and able to ambulate 12' then sitting break.  Amb 4' then had urinary incontinence episode and pt unaware that she had started to have BM.  Pt transferred to [facility].   Stairs            Wheelchair Mobility    Modified Rankin (Stroke Patients Only)       Balance     Sitting balance-[name] Scale: Fair Sitting balance - Comments:  (slight posterior lean due to pain.  Brace donned at EOB.)     Standing balance-[name] Scale: Fair Standing balance comment: Fair static balance with RW                    Cognition Arousal/Alertness: Awake/alert Behavior During Therapy: WFL for tasks assessed/performed Overall Cognitive Status: History of cognitive impairments - at baseline                      Exercises      General Comments General comments (skin integrity, edema, etc.): Pt able to recall 2/3 back precautions.  Forgetting no arching.      Pertinent Vitals/Pain Pain Assessment: 0-10 Pain Score: 3  Pain Location: mid/low back Pain Descriptors / Indicators: Aching Pain Intervention(s):  Monitored during session;Repositioned    Home Living                      Prior Function            PT Goals (current goals can now be found in the care plan section) Acute Rehab PT Goals Patient Stated Goal: to get back home PT Goal Formulation: With patient Time For Goal Achievement: 12/01/13 Potential to Achieve Goals: Good Progress towards PT goals: Progressing toward goals    Frequency  Min 5X/week    PT Plan Current plan remains appropriate     Co-evaluation             End of Session Equipment Utilized During Treatment: Oxygen;Gait belt;Back brace Activity Tolerance: Patient tolerated treatment well Patient left: Other (comment) (on [facility] with tech outside door then CNA came to A with bath)     Time: 9937-1696 PT Time Calculation (min): 28 min  Charges:  $Gait Training: 8-22 mins $Therapeutic Activity: 8-22 mins                    G Codes:      Skylen Spiering LUBECK 11/20/2013, 9:37 AM

## 2013-11-20 NOTE — Progress Notes (Signed)
While attempting to get pts. Standing weight with RN and NT assistance, Pt. Unable to hold balance on her legs. and guided to sitting position in RNs lap. Pts. R knee touching the scale. Pts. VSS. No injuries noted. On call for [facility] notified via text page. Family contacted and updated on pt. Status. RN will continue to monitor pt. For changes in condition.Blood pressure 125/48, pulse 82, temperature 97.7 F (36.5 C), temperature source Oral, resp. rate 18, height 5\' 2"  (1.575 m), weight 81.194 kg (179 lb), SpO2 92.00%.  Anelly Samarin, [name]

## 2013-11-20 NOTE — Progress Notes (Signed)
Called daughter at bedside and gave her the number to contact [facility] SW to assist with Medicaid long term care eligibility assessment application. Sent daughters number to [facility] SW for collaboration.  Will continue to monitor.  Of note, [facility] Care Management services does not replace or interfere with any services that are arranged by inpatient case management or social work.  For additional questions or referrals please contact Corliss Blacker BSN RN [facility] Liaison at [phone].

## 2013-11-20 NOTE — Progress Notes (Signed)
[facility] PROGRESS NOTE  [name] [name] ELF:[id number] DOB: 10-27-1940 DOA: 11/11/2013 PCP: [name], MD  Interim Summary Patient is a pleasant 73 year old female with multiple comorbidities including combined systolic and diastolic congestive heart failure, chronic obstructive pulmonary disease, hypertension, paroxysmal atrial fibrillation on chronic anticoagulation who was initially admitted to [facility]. she was apparently being pushed on her rolling walker when he slipped off a sidewalk edge of patient falling onto the ground. She subsequently experienced severe back pain. Initial imaging studies revealed the presence of T12-L1 fracture in region of prior fusion surgery. She was transferred to [facility] to be evaluated by interventional radiology. Her INR was reversed as if she underwent vertebral plasty on 11/16/2013. Hospitalization complicated by development of acute on chronic combined systolic and diastolic congestive heart failure as patient found to be 13 pounds heavier. Cardiology was consulted as she was administered IV Lasix. During this hospitalization she was noted to have mental status changes, acute delirium likely secondary to hospitalization, severe back pain as well as urinary tract infection. Patient gradually improved. She was evaluated by physical therapy who recommended skilled nursing facility placement. Unfortunately patient not eligible for Medicaid PCS.                                                                                                                                                                                                                                                            Assessment/Plan: 1. T12/L1 fracture -Patient having a fall in the community, resulted in severe back pain. A CT scan of lumbar thoracic spine performed on 11/11/2013 revealed an acute fracture through the anterior aspect of the T12 and L1  vertebral bodies. -She was transferred to [facility] for evaluation by interventional radiology regarding kyphoplasty. -Interventional radiology consulted, undergoing vertebroplasty of L1 compression fracture on 11/16/2013 -Patient reporting feeling better with significant improvement to pain  2. History of chronic combined systolic and diastolic congestive heart failure -Patient overloaded, was started on Lasix 80 mg IV twice a day. -Cardiology following -Has a net negative fluid balance of 5.1, having weight of 81.19 kg, down from 88.5 kg on 11/13/2013 -Metolazone 5 mg PO q daily added on 11/18/2013, continue IV lasix; plan to continue one more day of IV diuresis  3.  History of paroxysmal atrial fibrillation -Patient undergoing maze procedure in past. She is maintained on amiodarone 100 mg daily -Currently rate controlled -Transitioned to back to oral warfarin -Pharmacy consulted  4. Urinary tract infection -UA showing presence of bacteria, leukocyte esterase and nitrates. -Urine culture drawn on 11/14/2013 showing greater than 100,000 colony-forming units of gram-negative rods, urine cultures growing greater than 100,000 colony-forming units of Citrobacter species. -Stopped IV ceftriaxone, started ciprofloxacin based on organism susceptibility  5. Acute encephalopathy -Improved over the last 24 hours, she is certainly more awake and alert today -I suspect encephalopathy secondary to multiple factors including underlying infectious process, pain, hospitalization, and narcotic analgesics -Continue to provide supportive care  6.  Acute on Chronic Anemia -Hg remains in the low 7 range which she was typed and crossed and transfused 2 units of packed red blood cells on 11/19/2013 -Repeat H&H on 11/20/2012 showing a stable hemoglobin of 10.9  Code Status: DO NOT RESUSCITATE Family Communication: Spoke to her daughter present at bedside Disposition Plan: Likely be discharge to  home with home health services    Consultants:  Cardiology  Interventional radiology   Antibiotics:  Ceftriaxone 1 g IV every 24 hours, stopped on 11/17/2013  Ciprofloxacin started on 11/17/2013  HPI/Subjective: Patient is a pleasant 73 year old female having multiple comorbidities including combined systolic and diastolic congestive heart failure, having a fall with intense back pain. She was initially admitted to [facility], found to have a T12/L1 fracture. She had been on anticoagulation in the outpatient setting, given vitamin K with INR now trending down to 1.46 on a.m. lab work. She was transferred to [facility] to undergo kyphoplasty by interventional radiology.  Patient seems to be doing well, reports pain in under control, planning on discharging to her home with [facility] services.   Objective: Filed Vitals:   11/20/13 1327  BP: 118/42  Pulse: 63  Temp: 98.5 F (36.9 C)  Resp: 18    Intake/Output Summary (Last 24 hours) at 11/20/13 1712 Last data filed at 11/20/13 1416  Gross per 24 hour  Intake 1407.04 ml  Output   2303 ml  Net -895.96 ml   Filed Weights   11/18/13 0528 11/19/13 0543 11/20/13 0602  Weight: 87.544 kg (193 lb) 88.451 kg (195 lb) 81.194 kg (179 lb)    Exam:   General:  Patient is awake, she seems more alert and oriented on today's evaluation  Cardiovascular: Regular rate and rhythm normal S1-S2  Respiratory: Normal respiratory effort, have a few by basilar crackles, on supplemental oxygen  Abdomen: Soft nontender nondistended  Musculoskeletal: Has 1+ bilateral extremity pitting edema  Data Reviewed: Basic Metabolic Panel:  Recent Labs Lab 11/14/13 0547  11/16/13 0400 11/17/13 0600 11/18/13 0635 11/19/13 1155 11/20/13 0940  NA 139  139  < > 143 144 139 138 140  K 4.7  4.7  < > 4.4 4.2 3.7 3.8 3.5*  CL 97  97  < > 99 99 95* 92* 91*  CO2 29  30  < > 30 29 33* 34* 39*  GLUCOSE 105*  104*  < > 90 92 99 120*  121*  BUN 34*  34*  < > 42* 50* 50* 47* 42*  CREATININE 2.82*  2.83*  < > 1.99* 1.78* 1.63* 1.52* 1.41*  CALCIUM 9.4  9.4  < > 9.6 9.8 9.7 9.8 9.9  PHOS 5.5*  --   --   --   --   --   --   < > =  values in this interval not displayed. Liver Function Tests:  Recent Labs Lab 11/14/13 0547  ALBUMIN 3.2*   No results found for this basename: LIPASE, AMYLASE,  in the last 168 hours No results found for this basename: AMMONIA,  in the last 168 hours CBC:  Recent Labs Lab 11/14/13 0547  11/16/13 0400 11/17/13 0600 11/18/13 0635 11/19/13 1155 11/20/13 0940  WBC 8.0  < > 5.6 4.6 5.4 4.9 6.1  NEUTROABS 5.6  --   --   --   --   --   --   HGB 8.0*  < > 7.5* 7.4* 7.1* 7.3* 10.9*  HCT 26.3*  < > 24.7* 24.6* 23.8* 24.2* 34.4*  MCV 92.6  < > 89.5 90.4 90.5 90.3 89.4  PLT 241  < > 262 253 260 264 236  < > = values in this interval not displayed. Cardiac Enzymes: No results found for this basename: CKTOTAL, CKMB, CKMBINDEX, TROPONINI,  in the last 168 hours BNP (last 3 results)  Recent Labs  08/28/13 1418 10/02/13 1134 10/23/13 0806  PROBNP 1518.0* 2909.0* 1867.0*   CBG: No results found for this basename: GLUCAP,  in the last 168 hours  Recent Results (from the past 240 hour(s))  URINE CULTURE     Status: None   Collection Time    11/14/13 11:34 PM      Result Value Ref Range Status   Specimen Description URINE, CATHETERIZED   Final   Special Requests NONE   Final   Culture  Setup Time     Final   Value: 11/15/2013 04:21     Performed at [place]     Final   Value: >=100,000 COLONIES/ML     Performed at [facility]   Culture     Final   Value: CITROBACTER FREUNDII     Performed at [facility]   Report Status 11/17/2013 FINAL   Final   Organism ID, Bacteria CITROBACTER FREUNDII   Final     Studies: No results found.  Scheduled Meds: . sodium chloride   Intravenous Once  . amiodarone  100 mg Oral Daily  . antiseptic  oral rinse  7 mL Mouth Rinse BID  . ciprofloxacin  250 mg Oral BID  . colchicine  0.3 mg Oral Daily  . furosemide  80 mg Intravenous Q8H  . oxybutynin  5 mg Oral QHS  . sodium chloride  3 mL Intravenous Q12H  . warfarin  2.5 mg Oral ONCE-1800  . Warfarin - Pharmacist Dosing Inpatient   Does not apply q1800   Continuous Infusions: . heparin 1,100 Units/hr (11/19/13 0650)    Principal Problem:   Lumbar vertebral fracture Active Problems:   Juvenile rheumatic fever   Chronic kidney disease   Paroxysmal atrial fibrillation   S/P MVR (mitral valve replacement)   Chronic systolic heart failure   Long term (current) use of anticoagulants   Anemia in chronic kidney disease(285.21)   DNR (do not resuscitate)   Fall at home   Fx lumbar vertebra-closed   Acute respiratory failure with hypoxia    Time spent: 25 minutes    Kelvin Cellar  [facility] Pager [phone]. If 7PM-7AM, please contact night-coverage at [email or web address], password [facility] 11/20/2013, 5:12 PM  LOS: 9 days

## 2013-11-20 NOTE — Progress Notes (Addendum)
ANTICOAGULATION CONSULT NOTE - Follow Up Consult  Pharmacy Consult for heparin Indication: atrial fibrillation  Labs:  Recent Labs  11/18/13 0500 11/18/13 0635 11/18/13 2134 11/19/13 0452 11/19/13 1155  HGB  --  7.1*  --   --  7.3*  HCT  --  23.8*  --   --  24.2*  PLT  --  260  --   --  264  LABPROT  --  17.7*  --  17.9*  --   INR  --  1.46  --  1.48  --   HEPARINUNFRC 0.98*  --  0.77* 0.52  --   CREATININE  --  1.63*  --   --  1.52*    Assessment: 73 yo F admitted on 11/11/2013 after fall with walker and hitting mid back and head on concrete. Citrobacter UTI 9/9, Vertebroplasty 9/11.  Pharmacy consulted to dose heparin and warfarin  PMH: COPD, TIA/CVA, h/o rheumatic fever, HTN/HLD, bioprosthetic MVR, CKDIV, PAF s/p MAZE, arthritis, HFpEF  AC/Heme:  Afib,  Hep resumed 9/11 Coum 9/12. D/C hep when INR > 2 per Dr. Marlou Porch. No bleeding issues. But H/H low and finished 2 units PRBCs this am. Labs not drawn this am. Home dose: 2.5mg  daily except 1.25mg  on Friday (INR 2.86 on admission)  ID: UTI with UA +nitrite/leuk; Afeb, WBC wnl 9/10 CTX >> 9/12 9/12 Cipro x 5 days >> (9/17) 9/9 UCx- > 100K Citrobacter, R: cefaz. I: ctx, zosyn. S: FQs  CV: HFpEF (EF 50-55% on 10/05/13),  BP wnl, 120s, HR 50-80s, I/O 1799/3275 admit wt 179 accurate (dry wt 175) resumed lasix 80 IV q8h, amio PO  Renal: SCr 1.52- continuing downward trend, lytes wnl; colchicine for gout   Goal of Therapy:  Heparin level 0.3-0.7 units/ml   Plan:  Pending lab results : Continue heparin at 1100 units/hr &Warfarin 2.5 mg  Daily INR D/C hep when INR > 2  Thank you for allowing pharmacy to be a part of this patients care team.  Rowe Robert Pharm.D., BCPS, AQ-Cardiology Clinical Pharmacist 11/20/2013 8:55 AM Pager: [phone] Phone: [phone]  12:40 PM: Heparin level therapeutic INR trending up at 1.69  Plan as above  Thank you. Anette Guarneri, PharmD

## 2013-11-21 LAB — TYPE AND SCREEN
ABO/RH(D): B POS
ANTIBODY SCREEN: NEGATIVE
UNIT DIVISION: 0
Unit division: 0

## 2013-11-21 LAB — BASIC METABOLIC PANEL
Anion gap: 9 (ref 5–15)
BUN: 38 mg/dL — AB (ref 6–23)
CO2: 39 mEq/L — ABNORMAL HIGH (ref 19–32)
CREATININE: 1.38 mg/dL — AB (ref 0.50–1.10)
Calcium: 9.7 mg/dL (ref 8.4–10.5)
Chloride: 92 mEq/L — ABNORMAL LOW (ref 96–112)
GFR, EST AFRICAN AMERICAN: 43 mL/min — AB (ref >90)
GFR, EST NON AFRICAN AMERICAN: 37 mL/min — AB (ref >90)
Glucose, Bld: 95 mg/dL (ref 70–99)
POTASSIUM: 3.6 meq/L — AB (ref 3.7–5.3)
Sodium: 140 mEq/L (ref 137–147)

## 2013-11-21 LAB — CBC
HCT: 31.8 % — ABNORMAL LOW (ref 36.0–46.0)
Hemoglobin: 9.9 g/dL — ABNORMAL LOW (ref 12.0–15.0)
MCH: 28 pg (ref 26.0–34.0)
MCHC: 31.1 g/dL (ref 30.0–36.0)
MCV: 89.8 fL (ref 78.0–100.0)
Platelets: 223 10*3/uL (ref 150–400)
RBC: 3.54 MIL/uL — ABNORMAL LOW (ref 3.87–5.11)
RDW: 16.5 % — AB (ref 11.5–15.5)
WBC: 6.1 10*3/uL (ref 4.0–10.5)

## 2013-11-21 LAB — PROTIME-INR
INR: 1.83 — ABNORMAL HIGH (ref 0.00–1.49)
Prothrombin Time: 21.2 seconds — ABNORMAL HIGH (ref 11.6–15.2)

## 2013-11-21 LAB — HEPARIN LEVEL (UNFRACTIONATED): Heparin Unfractionated: 0.49 IU/mL (ref 0.30–0.70)

## 2013-11-21 MED ORDER — CIPROFLOXACIN HCL 250 MG PO TABS: 250.0000 mg | ORAL_TABLET | Freq: Two times a day (BID) | ORAL | Status: DC

## 2013-11-21 MED ORDER — TORSEMIDE 20 MG PO TABS
80.0000 mg | ORAL_TABLET | Freq: Every day | ORAL | Status: DC
  Administered 2013-11-21: 80 mg via ORAL
  Filled 2013-11-21: qty 4

## 2013-11-21 MED ORDER — WARFARIN SODIUM 2.5 MG PO TABS
2.5000 mg | ORAL_TABLET | Freq: Once | ORAL | Status: AC
  Administered 2013-11-21: 2.5 mg via ORAL
  Filled 2013-11-21: qty 1

## 2013-11-21 MED ORDER — POTASSIUM CHLORIDE CRYS ER 20 MEQ PO TBCR
40.0000 meq | EXTENDED_RELEASE_TABLET | Freq: Once | ORAL | Status: AC
  Administered 2013-11-21: 40 meq via ORAL
  Filled 2013-11-21: qty 2

## 2013-11-21 MED ORDER — HYDROCODONE-ACETAMINOPHEN 5-325 MG PO TABS: 1.0000 | ORAL_TABLET | Freq: Four times a day (QID) | ORAL | Status: DC | PRN

## 2013-11-21 NOTE — Evaluation (Signed)
Occupational Therapy Evaluation Patient Details Name: [name] [name] MRN: [id number] DOB: 02/14/1941 Today's Date: 11/21/2013    History of Present Illness Pt is a morbidly obese female who sustained a vertebral fx at T12-L1 on 11-11-13.  She has limited mobility, ambulates with a 4 wheeled walker short distances in the home.  She has had multiple admissions this year for CHF and was recently discharged from the [facility].  She lives alone with very close family supervision and assist.   Clinical Impression   Pt up to [facility] and then ambulated with walker and then chair pulled up. Educated on AE options for LB self care. She will benefit from continued OT to progress ADL independence. She would benefit from SNF at d/c as she is still requiring +2 assist.    Follow Up Recommendations  SNF;Supervision/Assistance - 24 hour    Equipment Recommendations  None recommended by OT    Recommendations for Other Services       Precautions / Restrictions Precautions Precautions: Back Required Braces or Orthoses: Other Brace/Splint Other Brace/Splint: lumbar corset should be worn whenever out of bed Restrictions Weight Bearing Restrictions: No      Mobility Bed Mobility Overal bed mobility: Needs Assistance;+2 for physical assistance Bed Mobility: Rolling;Sidelying to Sit Rolling: Mod assist Sidelying to sit: Mod assist;+2 for physical assistance       General bed mobility comments: Pt needs multi-modal cueing for technique  Transfers Overall transfer level: Needs assistance Equipment used: Rolling walker (2 wheeled) Transfers: Sit to/from Stand Sit to Stand: Mod assist;+2 physical assistance         General transfer comment: stood from bed with MOD of 2 and BSC with MIN of 2and cueing for hand placement.    Balance     Sitting balance-[name] Scale: Fair Sitting balance - Comments: Requires Min A at times to maintain balance sitting EOB due to posterior trunk lean. Able to self  correct x1 with cues for anterior weight shift. Total A donning corset. Attempted to have patient pull strings to tighten however unable to comprehend.     Standing balance-[name] Scale: Fair Standing balance comment: fair static with RW                            ADL Overall ADL's : Needs assistance/impaired Eating/Feeding: Independent;Sitting   Grooming: Wash/dry hands;Set up;Sitting   Upper Body Bathing: Minimal assitance;Sitting   Lower Body Bathing: +2 for physical assistance;Maximal assistance;Sit to/from stand   Upper Body Dressing : Total assistance;Sitting (with corset)   Lower Body Dressing: +2 for physical assistance;Total assistance;Sit to/from stand   Toilet Transfer: +2 for physical assistance;Moderate assistance;RW;BSC   Toileting- Clothing Manipulation and Hygiene: +2 for physical assistance;Total assistance;Sit to/from stand         General ADL Comments: Educated on AE options and pt states she has a long shoe horn. Demonstrated all pieces and educated on coverage. Pt states she will likely have her granddaughter obtain a kit for her. Pt desats to 97% on RA seated on BSC so reapplied O2. See vitals section of note. Pt unable to perform toilet hygiene without breaking back precautions so assisted pt with this. SHe will benefit from a toilet aid demo. Pt states she is planning SNF.     Vision                     Perception     Praxis  Pertinent Vitals/Pain Pain Assessment: 0-10 Pain Score: 1  Pain Location: back Pain Descriptors / Indicators:  (twinge) Pain Intervention(s): Monitored during session;Repositioned  O2 on RA 76% sitting 89-90% on 1.5 L sitting 93% on 2L sitting 87% after activity on 2L and up to 93% with rest.. Informed nursing.      Hand Dominance Right   Extremity/Trunk Assessment Upper Extremity Assessment Upper Extremity Assessment: Generalized weakness           Communication  Communication Communication: No difficulties   Cognition Arousal/Alertness: Awake/alert Behavior During Therapy: WFL for tasks assessed/performed Overall Cognitive Status: History of cognitive impairments - at baseline       Memory: Decreased short-term memory             General Comments       Exercises       Shoulder Instructions      Home Living Family/patient expects to be discharged to:: Private residence Living Arrangements: Alone (pt reports her daughter will be able to move in with her.) Available Help at Discharge: Family;Available PRN/intermittently Type of Home: House Home Access: Ramped entrance     Home Layout: One level     Bathroom Shower/Tub: Occupational psychologist: Handicapped height     Home Equipment: Environmental consultant - 2 wheels;Shower seat;Wheelchair - Psychologist, educational;Bedside commode Adaptive Equipment: Long-handled shoe horn        Prior Functioning/Environment Level of Independence: Independent with assistive device(s)        Comments: Family fixes meals, visits, helps with housework and shopping    OT Diagnosis: Generalized weakness   OT Problem List: Decreased strength;Decreased knowledge of use of DME or AE;Decreased knowledge of precautions   OT Treatment/Interventions: Self-care/ADL training;Patient/family education;Therapeutic activities;DME and/or AE instruction    OT Goals(Current goals can be found in the care plan section) Acute Rehab OT Goals Patient Stated Goal: to walk OT Goal Formulation: With patient Time For Goal Achievement: 12/05/13 Potential to Achieve Goals: Good  OT Frequency: Min 2X/week   Barriers to D/C:            Co-evaluation PT/OT/SLP Co-Evaluation/Treatment: Yes Reason for Co-Treatment: For patient/therapist safety   OT goals addressed during session: ADL's and self-care;Proper use of Adaptive equipment and DME      End of Session Equipment Utilized During Treatment: Rolling  walker  Activity Tolerance: Patient limited by fatigue Patient left: in chair;with call bell/phone within reach   Time: 4008-6761 OT Time Calculation (min): 39 min Charges:  OT General Charges $OT Visit: 1 Procedure OT Evaluation $Initial OT Evaluation Tier I: 1 Procedure OT Treatments $Self Care/Home Management : 8-22 mins G-Codes:    Jules Schick [phone] 11/21/2013, 9:50 AM

## 2013-11-21 NOTE — Progress Notes (Signed)
SATURATION QUALIFICATIONS: (This note is used to comply with regulatory documentation for home oxygen)  Patient Saturations on Room Air at Rest = 76%  Patient Saturations on Room Air while Ambulating = n/a  Patient Saturations on 2 Liters of oxygen while Ambulating = 87%

## 2013-11-21 NOTE — Progress Notes (Signed)
Patient indicated to her staff RN that she was going to skilled care at [facility] in [place]. [facility] is exploring the patient's eligibility for long term care Medicaid but this is not a firm plan for placement as we are unsure of her status.  In addition, this process usually takes several weeks to procure the status change.  Inpatient SW aware of above. Collaborated with PT and OT at bedside.  Patient continues to be 2:1 assist for safety.  We will encourage the family to provide this support short term as much as possible.  Requested that home health therapy work with the patient daily two weeks.  RNCM to follow up on this request.  Of note, [facility] Care Management services does not replace or interfere with any services that are arranged by inpatient case management or social work.  For additional questions or referrals please contact Corliss Blacker BSN RN [facility] Liaison at [phone].

## 2013-11-21 NOTE — Progress Notes (Signed)
ANTICOAGULATION CONSULT NOTE - Follow Up Consult  Pharmacy Consult for heparin/Coumadin Indication: atrial fibrillation  Labs:  Recent Labs  11/19/13 0452  11/19/13 1155 11/20/13 0940 11/20/13 1134 11/21/13 0316  HGB  --   < > 7.3* 10.9*  --  9.9*  HCT  --   --  24.2* 34.4*  --  31.8*  PLT  --   --  264 236  --  223  LABPROT 17.9*  --   --   --  19.9* 21.2*  INR 1.48  --   --   --  1.69* 1.83*  HEPARINUNFRC 0.52  --   --   --  0.31 0.49  CREATININE  --   --  1.52* 1.41*  --  1.38*  < > = values in this interval not displayed.  Assessment: 73 yo F on chronic Coumadin for afib, started on IV heparin while INR subtherapeutic.  Hemoglobin increased to 10.9 s/p PRBCs, but back down slightly to 9.9 today. Platelet count stable.  No bleeding or complications noted.  PMH: COPD, TIA/CVA, h/o rheumatic fever, HTN/HLD, bioprosthetic MVR, CKDIV, PAF s/p MAZE, arthritis, HFpEF  Home dose: 2.5mg  daily except 1.25mg  on Friday (INR 2.86 on admission)  Goal of Therapy:  Heparin level 0.3-0.7 units/ml INR 2-3   Plan:  Continue heparin at 1100 units/hr & Warfarin 2.5 mg  Daily INR D/C hep when INR > 2  Thank you for allowing pharmacy to be a part of this patient's care team.  Uvaldo Rising, BCPS  Clinical Pharmacist Pager [phone]  11/21/2013 9:25 AM

## 2013-11-21 NOTE — Progress Notes (Signed)
[name], female   DOB: [birth year], 73 y.o.   MRN: [id number]    Primary cardiologist: Dr. Carlyle Dolly Advanced CHF: Dr. [name] Consulting cardiologist: Dr. Satira Sark  Subjective:   [name] [name] is a 73 yo obese white female with known history of rheumatic fever during childhood and long standing history of chronic diastolic congestive heart failure. She also has a history of CKD, HTN , CVA 2003,, and PAF. Recently treated for Strep viridans bacteremia: no vegetation seen on TTE that required 10 day course of antibiotics.   Progressed to severe symptomatic mitral regurgitation with paroxysmal atrial fibrillation and underwent minimally invasive Maze procedure with MV replacement (05/16/13).   Initially admitted to [facility] after fall. Fracture at the T12-L1 region through an area of prior fusion. Neurosurgery recommended a back brace . Neuro Interventional radiology was consulted for kyphoplasty/Vertebroplasty.  9/9 she was transferred from [facility] to [facility].  She had vertebroplasty on 9/11. Pain in back is better.    She was found to have a UTI (Citrobacter) and is being treated.    Weight stable, breathing better.  Some walking with PT but very weak.  Maintaining NSR.   Creatinine 2.8> 2.4>1.99>1.63>1.38 Objective:   Temp:  [97.6 F (36.4 C)-98.5 F (36.9 C)] 97.6 F (36.4 C) (09/16 0658) Pulse Rate:  [63-67] 64 (09/16 0658) Resp:  [18] 18 (09/16 0658) BP: (118-138)/(42-56) 138/52 mmHg (09/16 0658) SpO2:  [96 %-97 %] 97 % (09/16 0658) Weight:  [179 lb (81.194 kg)] 179 lb (81.194 kg) (09/16 0658) Last BM Date: 11/20/13  Filed Weights   11/19/13 0543 11/20/13 0602 11/21/13 0658  Weight: 195 lb (88.451 kg) 179 lb (81.194 kg) 179 lb (81.194 kg)    Intake/Output Summary (Last 24 hours) at 11/21/13 0854 Last data filed at 11/21/13 0230  Gross per 24 hour  Intake    120 ml  Output      4 ml  Net    116 ml   PHYSICAL EXAM:  General: NAD. No resp  difficulty. Lying flat in bed. Marland Kitchen  HEENT: normal  Neck: supple. JVP 8 cm, probably mildly elevated (difficult). Carotids 2+ bilaterally; no bruits. No lymphadenopathy or thryomegaly appreciated.  Cor: PMI normal. Regular rate & rhythm. No rubs, gallops or murmurs.  Lungs: Slight crackles at bases bilaterally.  Abdomen: soft, nontender, nondistended. No hepatosplenomegaly. No bruits or masses. Good bowel sounds.  Extremities: no cyanosis, clubbing, rash,  1+ ankle edema bilaterally.   Neuro: Alert, oriented.  Moves all 4 extremities w/o difficulty. Affect pleasant. GU: Foley in place.    Lab Results:  Basic Metabolic Panel:  Recent Labs Lab 11/19/13 1155 11/20/13 0940 11/21/13 0316  NA 138 140 140  K 3.8 3.5* 3.6*  CL 92* 91* 92*  CO2 34* 39* 39*  GLUCOSE 120* 121* 95  BUN 47* 42* 38*  CREATININE 1.52* 1.41* 1.38*  CALCIUM 9.8 9.9 9.7    Liver Function Tests: No results found for this basename: AST, ALT, ALKPHOS, BILITOT, PROT, ALBUMIN,  in the last 168 hours  CBC:  Recent Labs Lab 11/19/13 1155 11/20/13 0940 11/21/13 0316  WBC 4.9 6.1 6.1  HGB 7.3* 10.9* 9.9*  HCT 24.2* 34.4* 31.8*  MCV 90.3 89.4 89.8  PLT 264 236 223    Coagulation:  Recent Labs Lab 11/19/13 0452 11/20/13 1134 11/21/13 0316  INR 1.48 1.69* 1.83*     Medications:   Scheduled Medications: . sodium chloride   Intravenous Once  .  amiodarone  100 mg Oral Daily  . antiseptic oral rinse  7 mL Mouth Rinse BID  . ciprofloxacin  250 mg Oral BID  . colchicine  0.3 mg Oral Daily  . oxybutynin  5 mg Oral QHS  . potassium chloride  40 mEq Oral Once  . sodium chloride  3 mL Intravenous Q12H  . torsemide  80 mg Oral Daily  . Warfarin - Pharmacist Dosing Inpatient   Does not apply q1800    Infusions: . heparin 1,100 Units/hr (11/21/13 0546)    PRN Medications: sodium chloride, HYDROcodone-acetaminophen, morphine injection, naLOXone (NARCAN)  injection, ondansetron (ZOFRAN) IV, ondansetron,  sodium chloride   Assessment/Plan   1. 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 Most echo with LVEF 09/2013 EF 50-55%. Weight overall down with diuresis.   - Transition to po torsemide today.  2. Paroxysmal atrial fibrillation: Status post prior cardioversion and more recently Maze procedure. Maintaining NSR. On amiodarone 100 mg daily. Heparin bridge while off coumadin.  INR up to 1.8. 3. CKD stage 3-4: Creatinine better today.   4. History of mitral regurgitation status post bioprosthetic MVR, stable by recent echocardiogram.  5. Moderate aortic regurgitation. 6. T12-L1 fracture in region of prior fusion, status post mechanical fall without syncope. S/p vertebroplasty, pain improved.  7. UTI: Citrobacter UTI on ciprofloxacin.   8. Delirium: Improved. Likely related to UTI, pain medications.  9. Anemia: Chronic. No active bleeding apparent.  She did get 2 units PRBCs with appropriate rise in hemoglobin.  10.  Needs ongoing PT work, plan for home with home health/PT.  From cardiac standpoint she could go.  I do not think she would need ongoing bridging to therapeutic INR.    [name] 11/21/2013 8:54 AM

## 2013-11-21 NOTE — Progress Notes (Addendum)
Physical Therapy Treatment Patient Details Name: [name] [name] MRN: [id number] DOB: 1940/08/15 Today's Date: 11/21/2013    History of Present Illness Pt is a morbidly obese female who sustained a vertebral fx at T12-L1 on 11-11-13.  She has limited mobility, ambulates with a 4 wheeled walker short distances in the home.  She has had multiple admissions this year for CHF and was recently discharged from the [facility].  She lives alone with very close family supervision and assist.    PT Comments    Continue to recommend SNF for further rehab.  Pt is +2 transfer with standing and bed mobility.  Pt with decreased o2 sats to 87 % with gait on 2L/min which increased to 93% with rest.  Follow Up Recommendations  SNF;Supervision/Assistance - 24 hour     Equipment Recommendations  None recommended by PT    Recommendations for Other Services       Precautions / Restrictions Precautions Precautions: Back Required Braces or Orthoses: Other Brace/Splint Other Brace/Splint: lumbar corset should be worn whenever out of bed Restrictions Weight Bearing Restrictions: No    Mobility  Bed Mobility Overal bed mobility: Needs Assistance;+2 for physical assistance Bed Mobility: Rolling;Sidelying to Sit Rolling: Mod assist Sidelying to sit: Mod assist;+2 for physical assistance       General bed mobility comments: Pt needs multi-modal cueing for technique  Transfers Overall transfer level: Needs assistance Equipment used: Rolling walker (2 wheeled) Transfers: Sit to/from Stand Sit to Stand: Mod assist;+2 physical assistance         General transfer comment: stood from bed with MOD of 2 and BSC with MIN of 2and cueing for hand placement.  Ambulation/Gait Ambulation/Gait assistance: Min assist;+2 safety/equipment Ambulation Distance (Feet): 16 Feet Assistive device: Rolling walker (2 wheeled) Gait Pattern/deviations: Trunk flexed;Decreased step length - right;Decreased step length -  left Gait velocity: decreased Gait velocity interpretation: Below normal speed for age/gender General Gait Details: Slow gait with RW, but able to take small steps.  Pt needs recliner follow due to need to sit abruptly.  o2 87% after gait on 2 L/min. increased back into 90's.   Stairs            Wheelchair Mobility    Modified Rankin (Stroke Patients Only)       Balance     Sitting balance-[name] Scale: Fair Sitting balance - Comments: Requires Min A at times to maintain balance sitting EOB due to posterior trunk lean. Able to self correct x1 with cues for anterior weight shift. Total A donning corset. Attempted to have patient pull strings to tighten however unable to comprehend.     Standing balance-[name] Scale: Fair Standing balance comment: fair static with RW                    Cognition Arousal/Alertness: Awake/alert Behavior During Therapy: WFL for tasks assessed/performed Overall Cognitive Status: History of cognitive impairments - at baseline       Memory: Decreased short-term memory              Exercises      General Comments General comments (skin integrity, edema, etc.): Pt recalled 3/3 back precautions today.        Pertinent Vitals/Pain Pain Assessment: 0-10 Pain Score: 1  Pain Location: back Pain Descriptors / Indicators: Other (Comment) (twinge) Pain Intervention(s): Monitored during session    Home Living  Prior Function            PT Goals (current goals can now be found in the care plan section) Acute Rehab PT Goals Patient Stated Goal: to walk PT Goal Formulation: With patient Time For Goal Achievement: 12/01/13 Potential to Achieve Goals: Good Progress towards PT goals: Progressing toward goals    Frequency  Min 5X/week    PT Plan Current plan remains appropriate    Co-evaluation   Reason for Co-Treatment: For patient/therapist safety  PT goals addressed during session:  mobility/safety with mobility; proper use of DME OT goals addressed during session: ADL's and self-care;Proper use of Adaptive equipment and DME     End of Session Equipment Utilized During Treatment: Oxygen;Gait belt;Back brace Activity Tolerance: Patient tolerated treatment well Patient left: in chair;with call bell/phone within reach     Time: 6834-1962 PT Time Calculation (min): 39 min  Charges:  $Gait Training: 8-22 mins $Therapeutic Activity: 8-22 mins                    G Codes:      Ryken Paschal LUBECK 11/21/2013, 9:32 AM

## 2013-11-21 NOTE — Discharge Summary (Signed)
Physician Discharge Summary  [name] [name] RXV:[id number] DOB: 01/02/1941 DOA: 11/11/2013  PCP: [name], MD  Admit date: 11/11/2013 Discharge date: 11/21/2013  Time spent: 35 minutes  Recommendations for Outpatient Follow-up:  1. Follow up with PCP in 1-2 weeks  Discharge Diagnoses:  Principal Problem:   Lumbar vertebral fracture Active Problems:   Juvenile rheumatic fever   Chronic kidney disease   Paroxysmal atrial fibrillation   S/P MVR (mitral valve replacement)   Chronic systolic heart failure   Long term (current) use of anticoagulants   Anemia in chronic kidney disease(285.21)   DNR (do not resuscitate)   Fall at home   Fx lumbar vertebra-closed   Acute respiratory failure with hypoxia   Discharge Condition: Stable  Diet recommendation: Heart healthy  Filed Weights   11/19/13 0543 11/20/13 0602 11/21/13 0658  Weight: 88.451 kg (195 lb) 81.194 kg (179 lb) 81.194 kg (179 lb)    History of present illness:  See admit h and p from 9/6 for details. Briefly, pt presents with fall from wheelchair after rolling into obstruction. Pt was noted to have lumbar vertebral fractures and subsequently admitted for further care.  Hospital Course:  1. T12/L1 fracture -Patient having a fall in the community, resulted in severe back pain. A CT scan of lumbar thoracic spine performed on 11/11/2013 revealed an acute fracture through the anterior aspect of the T12 and L1 vertebral bodies.  -She was transferred to [facility] for evaluation by interventional radiology regarding kyphoplasty.  -Interventional radiology consulted, underwent vertebroplasty of L1 compression fracture on 11/16/2013  -Patient noting gradual improvement in symptoms  2. History of chronic combined systolic and diastolic congestive heart failure  -Patient volume overloaded on admit, was started on Lasix 80 mg IV twice a day.  -Cardiology consulted  -Has a net negative fluid balance of 5.1,  having weight of 81.19 kg, down from 88.5 kg on 11/13/2013  -Metolazone 5 mg PO q daily added on 11/18/2013, continued IV lasix with transition to PO lasix on day of discharge with clearance for discharge per Cardiology 3. History of paroxysmal atrial fibrillation  -Patient undergoing maze procedure in past. She is maintained on amiodarone 100 mg daily  -Currently rate controlled  -Transitioned to back to oral warfarin with no need for bridge on discharge per Cardiology recs 4. Urinary tract infection  -UA showing presence of bacteria, leukocyte esterase and nitrates.  -Urine culture drawn on 11/14/2013 showing greater than 100,000 colony-forming units of gram-negative rods, urine cultures growing greater than 100,000 colony-forming units of Citrobacter species.  -Empiric ceftriaxone was stopped, started ciprofloxacin based on organism susceptibility with one more day of abx prescribed on discharge 5. Acute encephalopathy  -Improved over the last 24 hours, she is certainly more awake and alert today  -Likely encephalopathy secondary to multiple factors including underlying infectious process, pain, hospitalization, and narcotic analgesics  -Continued supportive care  6. Acute on Chronic Anemia  -Hg remains in the low 7 range which she was typed and crossed and transfused 2 units of packed red blood cells on 11/19/2013  -Repeat H&H on 11/20/2012 showing a stable hemoglobin of 10.9  Procedures:  Kyphoplasty 11/16/13  Consultations:  IR  Cardiology  Discharge Exam: Filed Vitals:   11/20/13 1327 11/20/13 2032 11/21/13 0658 11/21/13 0956  BP: 118/42 126/56 138/52 123/52  Pulse: 63 67 64 63  Temp: 98.5 F (36.9 C) 98.1 F (36.7 C) 97.6 F (36.4 C) 97.5 F (36.4 C)  TempSrc: Oral Oral Oral  Oral  Resp: 18 18 18    Height:      Weight:   81.194 kg (179 lb)   SpO2: 97% 96% 97% 97%    General: Awake, in nad Cardiovascular: regular, s1, s2 Respiratory: normal resp effort, no  wheezing  Discharge Instructions     Medication List         amiodarone 100 MG tablet  Commonly known as:  PACERONE  Take 1 tablet (100 mg total) by mouth daily.     ciprofloxacin 250 MG tablet  Commonly known as:  CIPRO  Take 1 tablet (250 mg total) by mouth 2 (two) times daily.     colchicine 0.6 MG tablet  Take 0.5 tablets (0.3 mg total) by mouth daily.     HYDROcodone-acetaminophen 5-325 MG per tablet  Commonly known as:  NORCO/VICODIN  Take 1 tablet by mouth every 6 (six) hours as needed for moderate pain.     oxybutynin 5 MG 24 hr tablet  Commonly known as:  DITROPAN-XL  Take 5 mg by mouth at bedtime.     potassium chloride SA 20 MEQ tablet  Commonly known as:  K-DUR,KLOR-CON  Take 1 tablet (20 mEq total) by mouth daily.     simvastatin 10 MG tablet  Commonly known as:  ZOCOR  Take 1 tablet (10 mg total) by mouth daily at 6 PM.     torsemide 20 MG tablet  Commonly known as:  DEMADEX  Take 80 mg by mouth every morning.     warfarin 2.5 MG tablet  Commonly known as:  COUMADIN  Take 1.25-2.5 mg by mouth See admin instructions. Takes one tablet (2.5mg  total) on all days except on Friday. Takes one-half tablet (1.25mg  total) on Fridays only       Allergies  Allergen Reactions  . Indomethacin Other (See Comments)    dizziness  . Norvasc [Amlodipine Besylate] Cough   Follow-up Information   Follow up with [facility]. (Registered Nurse, Physical Therapy, NA, OT, SW services to start within 24-48 hours of discharge)    Contact information:   [address] [place] [address] [phone]       Follow up with [facility] On 11/29/2013. (@ 9:45; Please bring all your medications to your visit. Gate Code [phone])    Specialty:  Cardiology   Contact information:   [address] [id number] [place] [address] [phone]      Follow up with [name], MD. Schedule an  appointment as soon as possible for a visit in 1 week.   Specialty:  Family Medicine   Contact information:   [place] [address] [phone]        The results of significant diagnostics from this hospitalization (including imaging, microbiology, ancillary and laboratory) are listed below for reference.    Significant Diagnostic Studies: Dg Thoracic Spine W/swimmers  11/11/2013   CLINICAL DATA:  Fall.  Pain.  EXAM: THORACIC SPINE - 2 VIEW + SWIMMERS  COMPARISON:  Chest radiographs 10/09/2013 and 10/08/2013. Chest CT 05/07/2013.  FINDINGS: There are 12 pairs of ribs. Upper thoracic dextroscoliosis is noted. No listhesis is seen. Vertebral body fusion is again seen at T12-L1 with the vertebral bodies appearing hypoplastic, likely congenital. There is new curvilinear lucency extending through the anterior cortex at L1 and to the inferior endplate, consistent with new fracture. There is also a fracture through an anterior osteophyte at T7-8 with questionable extension  through the T8 vertebral body, and this also appears new. Diffuse bridging osteophytosis is again seen throughout the visualized spine and may reflect DISH.  Cardiac silhouette is enlarged with evidence of prior mitral valve replacement. Surgical clips are noted in the mediastinum and lower left neck. Right PICC line has been removed. Right jugular sheath has also been removed. Ill defined right lower lobe infiltrate appears mildly improved.  IMPRESSION: 1. New L1 vertebral body fracture. 2. Fracture through an anterior osteophyte at T7-8 with questionable extension into the T8 vertebral body. Thoracic spine CT is recommended to further evaluate these findings. These results were called by telephone at the time of interpretation on 11/11/2013 at 8:18 pm to Dr. Riki Altes , who verbally acknowledged these results.   Electronically Signed   By: Logan Bores   On: 11/11/2013 20:19   Dg Lumbar Spine Complete  11/11/2013   CLINICAL  DATA:  Fall.  Low back pain.  EXAM: LUMBAR SPINE - COMPLETE 4+ VIEW  COMPARISON:  08/12/2004  FINDINGS: No evidence of acute lumbar spine fracture or subluxation. 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 Mild degenerative disc disease is seen at all lumbar levels, without significant change. Lower lumbar facet DJD again seen bilaterally from levels of L3-S1. No focal lytic sclerotic bone lesions identified.  IMPRESSION: No acute findings.  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  Degenerative spondylosis, as described above.   Electronically Signed   By: Earle Gell M.D.   On: 11/11/2013 20:03   Dg Hip Complete Right  11/15/2013   CLINICAL DATA:  Abnormal right hip radiograph  EXAM: RIGHT HIP - COMPLETE 2+ VIEW  COMPARISON:  Portal right hip radiograph dated 11/14/2013 at 2118 hr  FINDINGS: No definite fracture or dislocation is seen.  Mild degenerative changes of the bilateral hips.  Moderate degenerative changes the lower lumbar spine.  Visualized bony pelvis appears intact.  Surgical clips overlying the right inguinal region.  IMPRESSION: No definite fracture or dislocation is seen.   Electronically Signed   By: Julian Hy M.D.   On: 11/15/2013 00:25   Ct Head Wo Contrast  11/15/2013   CLINICAL DATA:  Left facial droop and slurred speech.  EXAM: CT HEAD WITHOUT CONTRAST  TECHNIQUE: Contiguous axial images were obtained from the base of the skull through the vertex without intravenous contrast.  COMPARISON:  11/11/2013  FINDINGS: Diffuse cerebral atrophy. Ventricular dilatation consistent with central atrophy. Low-attenuation changes in the deep white matter consistent with small vessel ischemia. Old lacune or infarcts in the periventricular white matter and basal ganglia bilaterally. No significant change since previous study. No mass effect or midline shift. No abnormal extra-axial fluid collections. Gray-white matter junctions are distinct. Basal cisterns are  not effaced. No depressed skull fractures. Visualized paranasal sinuses and mastoid air cells are not opacified.  IMPRESSION: No acute intracranial abnormalities. Chronic atrophy and small vessel ischemic changes. Old lacune or infarct.   Electronically Signed   By: Lucienne Capers M.D.   On: 11/15/2013 23:25   Ct Head Wo Contrast  11/11/2013   CLINICAL DATA:  Fall, posterior scalp hematoma, on Coumadin  EXAM: CT HEAD WITHOUT CONTRAST  CT CERVICAL SPINE WITHOUT CONTRAST  TECHNIQUE: Multidetector CT imaging of the head and cervical spine was performed following the standard protocol without intravenous contrast. Multiplanar CT image reconstructions of the cervical spine were also generated.  COMPARISON:  CT head dated 05/22/2013  FINDINGS: CT HEAD FINDINGS  No evidence of parenchymal  hemorrhage or extra-axial fluid collection. No mass lesion, mass effect, or midline shift.  No CT evidence of acute infarction.  Subcortical white matter and periventricular small vessel ischemic changes. Intracranial atherosclerosis.  Bilateral basal ganglia lacunar infarcts.  Mild global cortical atrophy.  No ventriculomegaly.  The visualized paranasal sinuses are essentially clear. The mastoid air cells are unopacified.  No evidence of calvarial fracture.  CT CERVICAL SPINE FINDINGS  Exaggerated cervical lordosis.  No evidence of fracture or dislocation. Vertebral body heights are maintained. Dens appears intact.  Moderate multilevel degenerative changes.  Status post left thyroidectomy.  Right thyroid is mildly nodular.  Visualized lung apices are essentially clear.  IMPRESSION: No evidence of acute intracranial abnormality. Atrophy with small vessel ischemic changes and intracranial atherosclerosis.  No evidence of traumatic injury to the cervical spine. Moderate multilevel degenerative changes.   Electronically Signed   By: Julian Hy M.D.   On: 11/11/2013 19:37   Ct Cervical Spine Wo Contrast  11/11/2013   CLINICAL  DATA:  Fall, posterior scalp hematoma, on Coumadin  EXAM: CT HEAD WITHOUT CONTRAST  CT CERVICAL SPINE WITHOUT CONTRAST  TECHNIQUE: Multidetector CT imaging of the head and cervical spine was performed following the standard protocol without intravenous contrast. Multiplanar CT image reconstructions of the cervical spine were also generated.  COMPARISON:  CT head dated 05/22/2013  FINDINGS: CT HEAD FINDINGS  No evidence of parenchymal hemorrhage or extra-axial fluid collection. No mass lesion, mass effect, or midline shift.  No CT evidence of acute infarction.  Subcortical white matter and periventricular small vessel ischemic changes. Intracranial atherosclerosis.  Bilateral basal ganglia lacunar infarcts.  Mild global cortical atrophy.  No ventriculomegaly.  The visualized paranasal sinuses are essentially clear. The mastoid air cells are unopacified.  No evidence of calvarial fracture.  CT CERVICAL SPINE FINDINGS  Exaggerated cervical lordosis.  No evidence of fracture or dislocation. Vertebral body heights are maintained. Dens appears intact.  Moderate multilevel degenerative changes.  Status post left thyroidectomy.  Right thyroid is mildly nodular.  Visualized lung apices are essentially clear.  IMPRESSION: No evidence of acute intracranial abnormality. Atrophy with small vessel ischemic changes and intracranial atherosclerosis.  No evidence of traumatic injury to the cervical spine. Moderate multilevel degenerative changes.   Electronically Signed   By: Julian Hy M.D.   On: 11/11/2013 19:37   Ct Thoracic Spine Wo Contrast  11/11/2013   CLINICAL DATA:  Fall.  EXAM: CT THORACIC AND LUMBAR SPINE WITHOUT CONTRAST  TECHNIQUE: Multidetector CT imaging of the thoracic and lumbar spine was performed without contrast. Multiplanar CT image reconstructions were also generated.  COMPARISON:  05/07/2013 and 02/11/2011  FINDINGS: CT THORACIC SPINE FINDINGS  There is mild biphasic curvature of the thoracolumbar  spine with primary curvature of the thoracic spine convex to the right and secondary curvature of the lumbar spine convex to the left unchanged. There is moderate spondylosis throughout the spine. There are bridging anterior osteophytes/ ossification of the anterior longitudinal ligament. There is mild compression deformity of T12 without significant change. There is decreased disc space at multiple levels of the thoracic spine unchanged.  There is 9 mm hypodensity over the right lobe of the thyroid. There are surgical clips adjacent the left thyroid which likely has been removed. There is calcified plaque over the thoracic aorta. There is evidence cardiomegaly with prosthetic mitral valve. There is patchy opacification over the mid to lower lungs as cannot exclude infection.6 2 cm right middle lobe rounded mass unchanged. 1.2 cm  nodular density more anteriorly in the right middle lobe slightly more prominent. Atrophic left kidney unchanged. Possible mild cholelithiasis.  CT LUMBAR SPINE FINDINGS  Moderate spondylosis with disc space narrowing at all levels with mild sparing of the L5-S1 level. Previously noted anterior wedging of L1 is again seen. Previously, there was evidence of partial fusion of T12 and L1 both demonstrating anterior wedging as there is now lucency through the anterior aspect of the previous vertebral body fusion with this lucency extending through the L1 vertebral body compatible with an acute fracture. No evidence of fracture fragments within the canal. No evidence of subluxation. Remainder of the exam is unchanged. Diverticulosis of the colon unchanged.  IMPRESSION: CT THORACIC SPINE IMPRESSION  Moderate spondylosis with multilevel disc disease and bridging anterior osteophyte suggesting DISH. Mild curvature convex to the right.  9 mm right thyroid nodule.  Previous left thyroidectomy.  Cardiomegaly.  Stable 2 cm right middle lobe nodule. 1.2 cm anterior right middle lobe nodule new or  larger. Mild heterogeneous opacification within the mid to lower lungs as cannot exclude infection. Consider chest CT for further evaluation.  CT LUMBAR SPINE IMPRESSION  Acute fracture through the anterior aspect of the previously seen fusion of the T12 and L1 vertebral bodies at the level of the anterior disc space as this fracture extends obliquely through the L1 vertebral body. No fracture fragments within the canal. No subluxation.  Moderate spondylosis with multilevel disc disease.  Diverticulosis of the colon.  These results were called by telephone at the time of interpretation on 11/11/2013 at 9:33 pm to Dr. Riki Altes , who verbally acknowledged these results.   Electronically Signed   By: Marin Olp M.D.   On: 11/11/2013 21:33   Ct Lumbar Spine Wo Contrast  11/11/2013   CLINICAL DATA:  Fall.  EXAM: CT THORACIC AND LUMBAR SPINE WITHOUT CONTRAST  TECHNIQUE: Multidetector CT imaging of the thoracic and lumbar spine was performed without contrast. Multiplanar CT image reconstructions were also generated.  COMPARISON:  05/07/2013 and 02/11/2011  FINDINGS: CT THORACIC SPINE FINDINGS  There is mild biphasic curvature of the thoracolumbar spine with primary curvature of the thoracic spine convex to the right and secondary curvature of the lumbar spine convex to the left unchanged. There is moderate spondylosis throughout the spine. There are bridging anterior osteophytes/ ossification of the anterior longitudinal ligament. There is mild compression deformity of T12 without significant change. There is decreased disc space at multiple levels of the thoracic spine unchanged.  There is 9 mm hypodensity over the right lobe of the thyroid. There are surgical clips adjacent the left thyroid which likely has been removed. There is calcified plaque over the thoracic aorta. There is evidence cardiomegaly with prosthetic mitral valve. There is patchy opacification over the mid to lower lungs as cannot exclude  infection.6 2 cm right middle lobe rounded mass unchanged. 1.2 cm nodular density more anteriorly in the right middle lobe slightly more prominent. Atrophic left kidney unchanged. Possible mild cholelithiasis.  CT LUMBAR SPINE FINDINGS  Moderate spondylosis with disc space narrowing at all levels with mild sparing of the L5-S1 level. Previously noted anterior wedging of L1 is again seen. Previously, there was evidence of partial fusion of T12 and L1 both demonstrating anterior wedging as there is now lucency through the anterior aspect of the previous vertebral body fusion with this lucency extending through the L1 vertebral body compatible with an acute fracture. No evidence of fracture fragments within the canal. No evidence  of subluxation. Remainder of the exam is unchanged. Diverticulosis of the colon unchanged.  IMPRESSION: CT THORACIC SPINE IMPRESSION  Moderate spondylosis with multilevel disc disease and bridging anterior osteophyte suggesting DISH. Mild curvature convex to the right.  9 mm right thyroid nodule.  Previous left thyroidectomy.  Cardiomegaly.  Stable 2 cm right middle lobe nodule. 1.2 cm anterior right middle lobe nodule new or larger. Mild heterogeneous opacification within the mid to lower lungs as cannot exclude infection. Consider chest CT for further evaluation.  CT LUMBAR SPINE IMPRESSION  Acute fracture through the anterior aspect of the previously seen fusion of the T12 and L1 vertebral bodies at the level of the anterior disc space as this fracture extends obliquely through the L1 vertebral body. No fracture fragments within the canal. No subluxation.  Moderate spondylosis with multilevel disc disease.  Diverticulosis of the colon.  These results were called by telephone at the time of interpretation on 11/11/2013 at 9:33 pm to Dr. Riki Altes , who verbally acknowledged these results.   Electronically Signed   By: Marin Olp M.D.   On: 11/11/2013 21:33   Dg Chest Port 1  View  11/14/2013   CLINICAL DATA:  Shortness of breath, back pain  EXAM: PORTABLE CHEST - 1 VIEW  COMPARISON:  10/09/2013  FINDINGS: Significant enlarged of the cardiac silhouette stable. Replacement cardiac valve stable. Increased hazy opacity throughout the right lung. Bilateral vascular congestion. Interstitial change seen bilaterally with peribronchial cuffing.  IMPRESSION: Findings suggest congestive heart failure. There is asymmetric opacity with much greater opacity on the right than the left. This may be due to asymmetric edema. Superimposed pneumonia/pneumonitis not excluded.   Electronically Signed   By: Skipper Cliche M.D.   On: 11/14/2013 15:18   Dg Hip Portable 1 View Right  11/14/2013   CLINICAL DATA:  Persistent right hip pain after a fall on Sunday.  EXAM: PORTABLE RIGHT HIP - 1 VIEW  COMPARISON:  None.  FINDINGS: Degenerative changes in the right hip. There is some irregularity of the femoral neck with linear lucency across the base of the femoral head. Nondisplaced fracture is not excluded on single portable view. Recommend obtaining dedicated hip series for further evaluation. Surgical clips in the soft tissues.  IMPRESSION: Linear lucency across the base of the right femoral neck may represent nondisplaced fracture. Suggest additional views for correlation   Electronically Signed   By: Lucienne Capers M.D.   On: 11/14/2013 21:35   Ir Vertebroplasty Lumobsacral Inj  11/16/2013   CLINICAL DATA:  Patient with severely painful compression fracture at L1.  EXAM: IR VERTEBROPLASTY LUMBOSACRAL INJ  MEDICATIONS: Versed 1 mg IV, Fentanyl 25 mcg IV.  ANESTHESIA/SEDATION: Total Moderate Sedation Time:  50 min.  FLUOROSCOPY TIME:  16.8 minutes.  PROCEDURE: Following a full explanation of the procedure along with the potential associated complications, an informed witnessed consent was obtained.  The patient was placed prone on the fluoroscopic table. Nasal oxygen was administered. Physiologic  monitoring was performed throughout the duration of the procedure. The skin overlying the thoracolumbar region was prepped and draped in the usual sterile fashion. The L1 vertebral body was identified and the right pedicle was infiltrated with 0.25% Bupivacaine. This was then followed by the advancement of a 13-gauge Cook needle through the right pedicle into the anterior one-third at L1. A gentle contrast injection demonstrated a trabecular pattern of contrast with early opacification of a paraspinous veins. This necessitated the use of Gel-Foam pledgets through the 13  gauge Cook spinal needle prior to the injection of methylmethacrylate mixture.  At this time, methylmethacrylate mixture was reconstituted. Under biplane intermittent fluoroscopy, the methylmethacrylate was then injected into the L1 vertebral body with filling of the vertebral body extension of the methylmethacrylate mixture seen extending into the fracture cleft between T12 and L1.  No extravasation was noted into the disk spaces or posteriorly into the spinal canal. No epidural venous contamination was seen.  The needle was then removed. Hemostasis was achieved at the skin entry site.  There were no acute complications. The patient tolerated the procedure well. The patient was then returned to her floor in the in stable condition.  IMPRESSION: Status post vertebral body augmentation for painful compression fracture at L1 using vertebroplasty technique.   Electronically Signed   By: Luanne Bras M.D.   On: 11/16/2013 15:19    Microbiology: Recent Results (from the past 240 hour(s))  URINE CULTURE     Status: None   Collection Time    11/14/13 11:34 PM      Result Value Ref Range Status   Specimen Description URINE, CATHETERIZED   Final   Special Requests NONE   Final   Culture  Setup Time     Final   Value: 11/15/2013 04:21     Performed at [facility] Count     Final   Value: >=100,000 COLONIES/ML      Performed at [facility]   Culture     Final   Value: CITROBACTER FREUNDII     Performed at [facility]   Report Status 11/17/2013 FINAL   Final   Organism ID, Bacteria CITROBACTER FREUNDII   Final     Labs: Basic Metabolic Panel:  Recent Labs Lab 11/17/13 0600 11/18/13 0635 11/19/13 1155 11/20/13 0940 11/21/13 0316  NA 144 139 138 140 140  K 4.2 3.7 3.8 3.5* 3.6*  CL 99 95* 92* 91* 92*  CO2 29 33* 34* 39* 39*  GLUCOSE 92 99 120* 121* 95  BUN 50* 50* 47* 42* 38*  CREATININE 1.78* 1.63* 1.52* 1.41* 1.38*  CALCIUM 9.8 9.7 9.8 9.9 9.7   Liver Function Tests: No results found for this basename: AST, ALT, ALKPHOS, BILITOT, PROT, ALBUMIN,  in the last 168 hours No results found for this basename: LIPASE, AMYLASE,  in the last 168 hours No results found for this basename: AMMONIA,  in the last 168 hours CBC:  Recent Labs Lab 11/17/13 0600 11/18/13 0635 11/19/13 1155 11/20/13 0940 11/21/13 0316  WBC 4.6 5.4 4.9 6.1 6.1  HGB 7.4* 7.1* 7.3* 10.9* 9.9*  HCT 24.6* 23.8* 24.2* 34.4* 31.8*  MCV 90.4 90.5 90.3 89.4 89.8  PLT 253 260 264 236 223   Cardiac Enzymes: No results found for this basename: CKTOTAL, CKMB, CKMBINDEX, TROPONINI,  in the last 168 hours BNP: BNP (last 3 results)  Recent Labs  08/28/13 1418 10/02/13 1134 10/23/13 0806  PROBNP 1518.0* 2909.0* 1867.0*   CBG: No results found for this basename: GLUCAP,  in the last 168 hours  Signed:  Lakeisha Waldrop K  [facility] 11/21/2013, 12:41 PM

## 2013-11-21 NOTE — Progress Notes (Signed)
Discussed discharge instructions with pt including medications to take at home, follow up appts, prescriptions, activity, diet, and how and when to call the dr. Abbott Pao and daughters verbalized understanding and denied any questions. Pt being discharged with oxygen.  Eulis Canner, RN

## 2013-11-23 ENCOUNTER — Telehealth (HOSPITAL_COMMUNITY): Payer: Self-pay | Admitting: Vascular Surgery

## 2013-11-23 NOTE — Telephone Encounter (Signed)
Pt granddaughter called pt has Diarrhea her granddaughter wants to know if she can take Imodium.. Please advise

## 2013-11-29 ENCOUNTER — Ambulatory Visit (INDEPENDENT_AMBULATORY_CARE_PROVIDER_SITE_OTHER): Payer: Commercial Managed Care - HMO | Admitting: *Deleted

## 2013-11-29 ENCOUNTER — Ambulatory Visit (HOSPITAL_COMMUNITY)
Admit: 2013-11-29 | Discharge: 2013-11-29 | Disposition: A | Discharge status: Home / Self Care | Payer: Medicare HMO | Source: Ambulatory Visit | Attending: Internal Medicine | Admitting: Internal Medicine

## 2013-11-29 ENCOUNTER — Encounter (HOSPITAL_COMMUNITY): Payer: Self-pay

## 2013-11-29 ENCOUNTER — Telehealth: Payer: Self-pay | Admitting: *Deleted

## 2013-11-29 VITALS — BP 123/69 | HR 64 | Resp 18 | Wt 178.2 lb

## 2013-11-29 DIAGNOSIS — I0981 Rheumatic heart failure: Secondary | ICD-10-CM | POA: Diagnosis not present

## 2013-11-29 DIAGNOSIS — I4891 Unspecified atrial fibrillation: Secondary | ICD-10-CM | POA: Diagnosis not present

## 2013-11-29 DIAGNOSIS — E785 Hyperlipidemia, unspecified: Secondary | ICD-10-CM | POA: Insufficient documentation

## 2013-11-29 DIAGNOSIS — Z66 Do not resuscitate: Secondary | ICD-10-CM | POA: Insufficient documentation

## 2013-11-29 DIAGNOSIS — I5032 Chronic diastolic (congestive) heart failure: Secondary | ICD-10-CM

## 2013-11-29 DIAGNOSIS — J449 Chronic obstructive pulmonary disease, unspecified: Secondary | ICD-10-CM | POA: Diagnosis not present

## 2013-11-29 DIAGNOSIS — Z8673 Personal history of transient ischemic attack (TIA), and cerebral infarction without residual deficits: Secondary | ICD-10-CM | POA: Insufficient documentation

## 2013-11-29 DIAGNOSIS — I5043 Acute on chronic combined systolic (congestive) and diastolic (congestive) heart failure: Secondary | ICD-10-CM

## 2013-11-29 DIAGNOSIS — E669 Obesity, unspecified: Secondary | ICD-10-CM | POA: Insufficient documentation

## 2013-11-29 DIAGNOSIS — I428 Other cardiomyopathies: Secondary | ICD-10-CM | POA: Diagnosis not present

## 2013-11-29 DIAGNOSIS — IMO0002 Reserved for concepts with insufficient information to code with codable children: Secondary | ICD-10-CM | POA: Diagnosis not present

## 2013-11-29 DIAGNOSIS — Z953 Presence of xenogenic heart valve: Secondary | ICD-10-CM

## 2013-11-29 DIAGNOSIS — Z954 Presence of other heart-valve replacement: Secondary | ICD-10-CM | POA: Insufficient documentation

## 2013-11-29 DIAGNOSIS — N184 Chronic kidney disease, stage 4 (severe): Secondary | ICD-10-CM | POA: Diagnosis not present

## 2013-11-29 DIAGNOSIS — I6350 Cerebral infarction due to unspecified occlusion or stenosis of unspecified cerebral artery: Secondary | ICD-10-CM

## 2013-11-29 DIAGNOSIS — M129 Arthropathy, unspecified: Secondary | ICD-10-CM | POA: Insufficient documentation

## 2013-11-29 DIAGNOSIS — Z9889 Other specified postprocedural states: Secondary | ICD-10-CM

## 2013-11-29 DIAGNOSIS — I340 Nonrheumatic mitral (valve) insufficiency: Secondary | ICD-10-CM

## 2013-11-29 DIAGNOSIS — Q2381 Bicuspid aortic valve: Secondary | ICD-10-CM

## 2013-11-29 DIAGNOSIS — Z8679 Personal history of other diseases of the circulatory system: Secondary | ICD-10-CM

## 2013-11-29 DIAGNOSIS — Q231 Congenital insufficiency of aortic valve: Secondary | ICD-10-CM | POA: Insufficient documentation

## 2013-11-29 DIAGNOSIS — I509 Heart failure, unspecified: Secondary | ICD-10-CM

## 2013-11-29 DIAGNOSIS — I129 Hypertensive chronic kidney disease with stage 1 through stage 4 chronic kidney disease, or unspecified chronic kidney disease: Secondary | ICD-10-CM | POA: Diagnosis not present

## 2013-11-29 DIAGNOSIS — I480 Paroxysmal atrial fibrillation: Secondary | ICD-10-CM

## 2013-11-29 DIAGNOSIS — Z7901 Long term (current) use of anticoagulants: Secondary | ICD-10-CM | POA: Diagnosis not present

## 2013-11-29 DIAGNOSIS — Z79899 Other long term (current) drug therapy: Secondary | ICD-10-CM | POA: Diagnosis not present

## 2013-11-29 DIAGNOSIS — I059 Rheumatic mitral valve disease, unspecified: Secondary | ICD-10-CM | POA: Diagnosis not present

## 2013-11-29 DIAGNOSIS — J4489 Other specified chronic obstructive pulmonary disease: Secondary | ICD-10-CM | POA: Insufficient documentation

## 2013-11-29 DIAGNOSIS — Z952 Presence of prosthetic heart valve: Secondary | ICD-10-CM

## 2013-11-29 HISTORY — DX: Other cardiomyopathies: I42.8

## 2013-11-29 LAB — BASIC METABOLIC PANEL
Anion gap: 12 (ref 5–15)
BUN: 24 mg/dL — AB (ref 6–23)
CHLORIDE: 94 meq/L — AB (ref 96–112)
CO2: 29 mEq/L (ref 19–32)
Calcium: 9.2 mg/dL (ref 8.4–10.5)
Creatinine, Ser: 1.48 mg/dL — ABNORMAL HIGH (ref 0.50–1.10)
GFR calc Af Amer: 40 mL/min — ABNORMAL LOW (ref >90)
GFR, EST NON AFRICAN AMERICAN: 34 mL/min — AB (ref >90)
Glucose, Bld: 94 mg/dL (ref 70–99)
Potassium: 4.3 mEq/L (ref 3.7–5.3)
Sodium: 135 mEq/L — ABNORMAL LOW (ref 137–147)

## 2013-11-29 LAB — PROTIME-INR
INR: 2.52 — ABNORMAL HIGH (ref 0.00–1.49)
Prothrombin Time: 27.2 seconds — ABNORMAL HIGH (ref 11.6–15.2)

## 2013-11-29 LAB — POCT INR: INR: 2.52

## 2013-11-29 LAB — PRO B NATRIURETIC PEPTIDE: PRO B NATRI PEPTIDE: 2233 pg/mL — AB (ref 0–125)

## 2013-11-29 NOTE — Progress Notes (Signed)
[name], female   DOB: 05-31-1940, 73 y.o.   MRN: [id number]  Primary Physician: Dr. Quintin Alto  Primary Cardiologist: Dr. Harl Bowie  Cardiac Surgeon: Dr Roxy Manns.   HPI: [name] [name] is a 73 yo obese white female with known history of rheumatic fever during childhood and long standing history of chronic diastolic congestive heart failure. She also has a history of CKD, HTN , CVA 2003,and PAF.  Progressed to severe symptomatic mitral regurgitation with paroxysmal atrial fibrillation and underwent minimally invasive Maze procedure with MV replacement (05/16/13). Was discharged 05/31/13 to [facility] center and weight was 207 lbs.  Pre-operative LHC in 2/15 showed no significant CAD.   Admitted to [facility] 06/04/13 with volume overload. Diuresed with lasix drip, Milrinone and metolazone. As volume improved she was transitioned to torsemide 80 mg daily and spironolactone 25 mg daily. She also had successful DC-CV 06/06/13 for A-flutter with RVR. Discharged on coumadin with INR followed by [facility] cardiology. Discharge weight 191 pounds. Discharged to [facility].   Readmitted to [facility] 10/02/13 with increased SOB and fatigue. Diuresed with IV lasix + dopamine. Her hospital course was complicated by cardiorenal syndrome and fever. The blood cultures came back positive for strep viridans and she was switched to Ceftriaxone. ID was consulted and they did not recommend TEE but recommended TTE. The TTE showed no endocarditis and EF 50-55% and moderate AI. A PICC was placed for IV ceftriaxone for a total of 10 days of treatment, through August 14th. She was discharged back to [facility] SNF with PICC. Also Hospice was considered but she wanted to wait.  Discharge weight was 167 pounds.   [facility] Follow up for Heart Failure: Admitted to [facility] after fall and fractured T12-L1 region. Underwent vertebroplasty 11/16/13. Doing well. Reports back pain is better just sore. Denies SOB, PND, CP or edema. Sleeping in lift chair  currently d/t can't get in and out of bed right now. Taking medications as prescribed. At home with family. Weight at home 170-171 lbs. Following a low salt and drinking less than 2L a day. Wearing chronic 2L O2  Labs: 06/13/13 K 4.5 Creatinine 1.69 Labs: 06/18/13 K 4.4 Creatinine 1.79 Labs: 5/15 K 4.5, creatinine 2.48, LFTs normal, TSH 4.6 (elevated), BNP 1514 (lower) Labs 08/28/13 K 4.5 Creatinine 2.8  LAbs 10/11/13 K 3.5 Creatinine 2.3 Hgb 8.7  Labs 11/13/13: K 4.2, creatinine 2.59   SH: Lives in [place] with family. Never smoked. She does drink alcohol.   FH: Father and brother had MI  ROS: All systems negative except as listed in HPI, PMH and Problem List.  Past Medical History  Diagnosis Date  . COPD (chronic obstructive pulmonary disease)   . History of TIA (transient ischemic attack)     Diagnosed 2003  . History of stroke     2000 [place]  . Obesity   . Juvenile rheumatic fever     Age 46  . Essential hypertension, benign   . Hyperlipidemia   . Mitral regurgitation     Status post bioprosthetic MVR (05/2013)  . CKD (chronic kidney disease) stage 4, GFR 15-29 ml/min   . Paroxysmal atrial fibrillation     s/p MAZE (05/2013)  . Chronic diastolic congestive heart failure     a) ECHO (09/2013) EF 50-55%  . Arthritis   . Aortic insufficiency due to bicuspid aortic valve     Moderate by TEE  . 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 S/P Maze operation for atrial fibrillation 05/16/2013    Complete bilateral atrial lesion set using cryothermy and bipolar radiofrequency ablation with oversewing of LA appendage  . NICM (nonischemic cardiomyopathy)     a) LHC (04/2013) no significant CAD    Current Outpatient Prescriptions  Medication Sig Dispense Refill  . amiodarone (PACERONE) 100 MG tablet Take 1 tablet (100 mg total) by mouth  daily.  30 tablet  0  . colchicine 0.6 MG tablet Take 0.5 tablets (0.3 mg total) by mouth daily.  30 tablet  0  . HYDROcodone-acetaminophen (NORCO/VICODIN) 5-325 MG per tablet Take 1 tablet by mouth every 6 (six) hours as needed for moderate pain.  30 tablet  0  . oxybutynin (DITROPAN-XL) 5 MG 24 hr tablet Take 5 mg by mouth at bedtime.      . potassium chloride SA (K-DUR,KLOR-CON) 20 MEQ tablet Take 1 tablet (20 mEq total) by mouth daily.  60 tablet  6  . simvastatin (ZOCOR) 10 MG tablet Take 1 tablet (10 mg total) by mouth daily at 6 PM.  30 tablet  11  . torsemide (DEMADEX) 20 MG tablet Take 80 mg by mouth every morning.      . warfarin (COUMADIN) 2.5 MG tablet Take 1.25-2.5 mg by mouth See admin instructions. Takes one tablet (2.5mg  total) on all days except on Friday. Takes one-half tablet (1.25mg  total) on Fridays only       No current facility-administered medications for this encounter.    Filed Vitals:   11/29/13 0936  BP: 123/69  Pulse: 64  Resp: 18  Weight: 178 lb 4 oz (80.854 kg)  SpO2: 97%    PHYSICAL EXAM: General:  Elderly; NAD. No resp difficulty, in wheelchair and granddaughter present HEENT: normal Neck: supple. JVD 7; Carotids 2+ bilaterally; no bruits. No lymphadenopathy or thryomegaly appreciated. Cor: PMI normal. Irregular rate & rhythm. No rubs, gallops or murmurs. Lungs: diminished in the bases Abdomen: soft, nontender, nondistended. No hepatosplenomegaly. No bruits or masses. Good bowel sounds. Extremities: no cyanosis, clubbing, rash, 1+ ankle bilaterally edema.   Neuro: alert & orientedx3, cranial nerves grossly intact. Moves all 4 extremities w/o difficulty. Affect pleasant.   ASSESSMENT & PLAN:  1. Chronic primarily diastolic HF with history of prominent RV failure: EF 50-55% (ECHO 09/2013)  - Reviewed discharge summary and patient was recently in the hospital for fall with T12-L1 fracture. Discharge weight 179 lbs.  - NYHA II-III symptoms and volume  status mildly elevated. Will continue torsemide 80 mg daily with 20 meq of potassium daily. Will check BMET and pro-BNP today. - SBP stable.  - Reinforced the need and importance of daily weights, a low sodium diet, and fluid restriction (less than 2 L a day). Instructed to call the HF clinic if weight increases more than 3 lbs overnight or 5 lbs in a week.  2. PAF: S/p Maze with MV repair (05/2013) Appears to be in NSR today. Will continue amiodarone 100 mg daily. On Coumadin which is managed by [facility] office. Has not had INR checked since d/c will check and forward to [facility].   3. CKD stage III-IV: baseline creatinine 1.8-2.2. Will check BMET today. 4. History of MR:  S/p bioprosthetic MVR, stable by recent ECHO 5. T12-L1 fracture in region of prior fusion s/p mechanical fall without syncope. Underwent vertebroplasty (11/16/13). Pain improved and just reports it being sore now. Encouraged her to  continue to use incentive spirometer at home to hopefully prevent PNA.  6. DNR- Completed Gold DNR form in the past in clinic.   F/U 1 month [name] Brunt NP-C  11/29/2013

## 2013-11-29 NOTE — Telephone Encounter (Signed)
Granddaughter called today stating that her grandmother fell and broke her back a couple of weeks ago.  [place] is coming to patient's home. They suggested to patient to contact our office In reference to Dr. Harl Bowie putting order in to have patient's coumadin checked at her home.  Please call cell # [phone] and ask for [facility].

## 2013-11-29 NOTE — Patient Instructions (Signed)
Doing well.  Continue current medications.  Call [facility] office about INR checks with Home Health.  Follow up in 1 month  Do the following things EVERYDAY: 1) Weigh yourself in the morning before breakfast. Write it down and keep it in a log. 2) Take your medicines as prescribed 3) Eat low salt foods-Limit salt (sodium) to 2000 mg per day.  4) Stay as active as you can everyday 5) Limit all fluids for the day to less than 2 liters 6)

## 2013-11-29 NOTE — Telephone Encounter (Signed)
Spoke with [place].  Would like [facility] to check INR while pt is homebound.  Called [place] RN [facility] and order given for them to do INR checks at home.  Next check due 12/06/13 with results to me.

## 2013-12-06 ENCOUNTER — Ambulatory Visit (INDEPENDENT_AMBULATORY_CARE_PROVIDER_SITE_OTHER): Payer: Commercial Managed Care - HMO | Admitting: *Deleted

## 2013-12-06 DIAGNOSIS — I4891 Unspecified atrial fibrillation: Secondary | ICD-10-CM

## 2013-12-06 DIAGNOSIS — I5043 Acute on chronic combined systolic (congestive) and diastolic (congestive) heart failure: Secondary | ICD-10-CM

## 2013-12-06 DIAGNOSIS — Z953 Presence of xenogenic heart valve: Secondary | ICD-10-CM

## 2013-12-06 DIAGNOSIS — Z8679 Personal history of other diseases of the circulatory system: Secondary | ICD-10-CM

## 2013-12-06 DIAGNOSIS — Z9889 Other specified postprocedural states: Secondary | ICD-10-CM

## 2013-12-06 DIAGNOSIS — I6359 Cerebral infarction due to unspecified occlusion or stenosis of other cerebral artery: Secondary | ICD-10-CM

## 2013-12-06 LAB — POCT INR: INR: 2.4

## 2013-12-10 ENCOUNTER — Other Ambulatory Visit: Payer: Self-pay | Admitting: Internal Medicine

## 2013-12-13 ENCOUNTER — Ambulatory Visit (INDEPENDENT_AMBULATORY_CARE_PROVIDER_SITE_OTHER): Payer: Commercial Managed Care - HMO | Admitting: Pharmacist

## 2013-12-13 ENCOUNTER — Telehealth: Payer: Self-pay | Admitting: Cardiology

## 2013-12-13 DIAGNOSIS — I4891 Unspecified atrial fibrillation: Secondary | ICD-10-CM

## 2013-12-13 DIAGNOSIS — Z953 Presence of xenogenic heart valve: Secondary | ICD-10-CM

## 2013-12-13 DIAGNOSIS — Z9889 Other specified postprocedural states: Secondary | ICD-10-CM

## 2013-12-13 DIAGNOSIS — Z8679 Personal history of other diseases of the circulatory system: Secondary | ICD-10-CM

## 2013-12-13 DIAGNOSIS — I6359 Cerebral infarction due to unspecified occlusion or stenosis of other cerebral artery: Secondary | ICD-10-CM

## 2013-12-13 DIAGNOSIS — I5043 Acute on chronic combined systolic (congestive) and diastolic (congestive) heart failure: Secondary | ICD-10-CM

## 2013-12-13 LAB — POCT INR: INR: 5.3

## 2013-12-13 NOTE — Telephone Encounter (Signed)
Spoke with Vaughan Basta.  INR dosed.  See anticoag note for details.

## 2013-12-13 NOTE — Telephone Encounter (Signed)
Nurse states she needs a call back today.

## 2013-12-20 ENCOUNTER — Ambulatory Visit (INDEPENDENT_AMBULATORY_CARE_PROVIDER_SITE_OTHER): Payer: Commercial Managed Care - HMO | Admitting: *Deleted

## 2013-12-20 DIAGNOSIS — Z9889 Other specified postprocedural states: Secondary | ICD-10-CM

## 2013-12-20 DIAGNOSIS — Z8679 Personal history of other diseases of the circulatory system: Secondary | ICD-10-CM

## 2013-12-20 DIAGNOSIS — I4891 Unspecified atrial fibrillation: Secondary | ICD-10-CM

## 2013-12-20 DIAGNOSIS — Z953 Presence of xenogenic heart valve: Secondary | ICD-10-CM

## 2013-12-20 DIAGNOSIS — I639 Cerebral infarction, unspecified: Secondary | ICD-10-CM

## 2013-12-20 DIAGNOSIS — I5043 Acute on chronic combined systolic (congestive) and diastolic (congestive) heart failure: Secondary | ICD-10-CM

## 2013-12-20 LAB — POCT INR: INR: 3.7

## 2013-12-27 ENCOUNTER — Ambulatory Visit (INDEPENDENT_AMBULATORY_CARE_PROVIDER_SITE_OTHER): Payer: Commercial Managed Care - HMO | Admitting: *Deleted

## 2013-12-27 DIAGNOSIS — Z8679 Personal history of other diseases of the circulatory system: Secondary | ICD-10-CM

## 2013-12-27 DIAGNOSIS — Z9889 Other specified postprocedural states: Secondary | ICD-10-CM

## 2013-12-27 DIAGNOSIS — I639 Cerebral infarction, unspecified: Secondary | ICD-10-CM

## 2013-12-27 DIAGNOSIS — Z953 Presence of xenogenic heart valve: Secondary | ICD-10-CM

## 2013-12-27 DIAGNOSIS — I4891 Unspecified atrial fibrillation: Secondary | ICD-10-CM

## 2013-12-27 DIAGNOSIS — I5043 Acute on chronic combined systolic (congestive) and diastolic (congestive) heart failure: Secondary | ICD-10-CM

## 2013-12-27 LAB — POCT INR: INR: 6.4

## 2013-12-31 ENCOUNTER — Ambulatory Visit (HOSPITAL_COMMUNITY)
Admission: RE | Admit: 2013-12-31 | Discharge: 2013-12-31 | Disposition: A | Discharge status: Home / Self Care | Payer: Medicare HMO | Source: Ambulatory Visit | Attending: Cardiology | Admitting: Cardiology

## 2013-12-31 ENCOUNTER — Encounter (HOSPITAL_COMMUNITY): Payer: Self-pay

## 2013-12-31 ENCOUNTER — Encounter: Payer: Self-pay | Admitting: *Deleted

## 2013-12-31 VITALS — BP 142/83 | HR 65 | Resp 18 | Wt 175.4 lb

## 2013-12-31 DIAGNOSIS — I429 Cardiomyopathy, unspecified: Secondary | ICD-10-CM | POA: Diagnosis not present

## 2013-12-31 DIAGNOSIS — I129 Hypertensive chronic kidney disease with stage 1 through stage 4 chronic kidney disease, or unspecified chronic kidney disease: Secondary | ICD-10-CM | POA: Diagnosis not present

## 2013-12-31 DIAGNOSIS — E785 Hyperlipidemia, unspecified: Secondary | ICD-10-CM | POA: Insufficient documentation

## 2013-12-31 DIAGNOSIS — Z8673 Personal history of transient ischemic attack (TIA), and cerebral infarction without residual deficits: Secondary | ICD-10-CM | POA: Diagnosis not present

## 2013-12-31 DIAGNOSIS — J449 Chronic obstructive pulmonary disease, unspecified: Secondary | ICD-10-CM | POA: Diagnosis not present

## 2013-12-31 DIAGNOSIS — Z952 Presence of prosthetic heart valve: Secondary | ICD-10-CM | POA: Insufficient documentation

## 2013-12-31 DIAGNOSIS — I5032 Chronic diastolic (congestive) heart failure: Secondary | ICD-10-CM

## 2013-12-31 DIAGNOSIS — I480 Paroxysmal atrial fibrillation: Secondary | ICD-10-CM | POA: Insufficient documentation

## 2013-12-31 DIAGNOSIS — Z66 Do not resuscitate: Secondary | ICD-10-CM | POA: Insufficient documentation

## 2013-12-31 DIAGNOSIS — Z79899 Other long term (current) drug therapy: Secondary | ICD-10-CM | POA: Diagnosis not present

## 2013-12-31 DIAGNOSIS — N183 Chronic kidney disease, stage 3 (moderate): Secondary | ICD-10-CM | POA: Diagnosis not present

## 2013-12-31 DIAGNOSIS — Z981 Arthrodesis status: Secondary | ICD-10-CM | POA: Diagnosis not present

## 2013-12-31 LAB — BASIC METABOLIC PANEL
Anion gap: 11 (ref 5–15)
BUN: 22 mg/dL (ref 6–23)
CALCIUM: 9.5 mg/dL (ref 8.4–10.5)
CHLORIDE: 101 meq/L (ref 96–112)
CO2: 31 meq/L (ref 19–32)
Creatinine, Ser: 1.7 mg/dL — ABNORMAL HIGH (ref 0.50–1.10)
GFR calc Af Amer: 34 mL/min — ABNORMAL LOW (ref >90)
GFR calc non Af Amer: 29 mL/min — ABNORMAL LOW (ref >90)
Glucose, Bld: 96 mg/dL (ref 70–99)
Potassium: 3.9 mEq/L (ref 3.7–5.3)
Sodium: 143 mEq/L (ref 137–147)

## 2013-12-31 LAB — PROTIME-INR
INR: 3.93 — ABNORMAL HIGH (ref 0.00–1.49)
Prothrombin Time: 38.8 seconds — ABNORMAL HIGH (ref 11.6–15.2)

## 2013-12-31 NOTE — Patient Instructions (Signed)
Follow up in 6 weeks with Dr McLean  Do the following things EVERYDAY: 1) Weigh yourself in the morning before breakfast. Write it down and keep it in a log. 2) Take your medicines as prescribed 3) Eat low salt foods-Limit salt (sodium) to 2000 mg per day.  4) Stay as active as you can everyday 5) Limit all fluids for the day to less than 2 liters  

## 2013-12-31 NOTE — Progress Notes (Addendum)
Patient ID: PREZLEY QADIR, female   DOB: 03/27/1940, 73 y.o.   MRN: [id number]  Primary Physician: Dr. Quintin Alto  Primary Cardiologist: Dr. Harl Bowie  Cardiac Surgeon: Dr Roxy Manns.   HPI: Ms. Stebbins is a 73 yo obese white female with known history of rheumatic fever during childhood and long standing history of chronic diastolic congestive heart failure. She also has a history of CKD, HTN , CVA 2003,and PAF.  Progressed to severe symptomatic mitral regurgitation with paroxysmal atrial fibrillation and underwent minimally invasive Maze procedure with MV replacement (05/16/13). Was discharged 05/31/13 to [facility] center and weight was 207 lbs.  Pre-operative LHC in 2/15 showed no significant CAD.   Admitted to [facility] 06/04/13 with volume overload. Diuresed with lasix drip, Milrinone and metolazone. As volume improved she was transitioned to torsemide 80 mg daily and spironolactone 25 mg daily. She also had successful DC-CV 06/06/13 for A-flutter with RVR. Discharged on coumadin with INR followed by [facility] cardiology. Discharge weight 191 pounds. Discharged to [facility].   Readmitted to [facility] 10/02/13 with increased SOB and fatigue. Diuresed with IV lasix + dopamine. Her hospital course was complicated by cardiorenal syndrome and fever. The blood cultures came back positive for strep viridans and she was switched to Ceftriaxone. ID was consulted and they did not recommend TEE but recommended TTE. The TTE showed no endocarditis and EF 50-55% and moderate AI. A PICC was placed for IV ceftriaxone for a total of 10 days of treatment, through August 14th. She was discharged back to [facility] SNF with PICC. Also Hospice was considered but she wanted to wait.  Discharge weight was 167 pounds.   Admitted to [facility] after fall and fractured T12-L1 region. Underwent vertebroplasty 11/16/13.   Follow up for Heart Failure: Overall she is improving. Living alone again. Denies SOB/PND/ORthopnea. Weight at home 168-171 pounds. Taking all  medications. Wears home oxygen 2 liters continuously. Having difficulty with INR.  Appetite poor. Has follow up with PCP next week.    Labs: 06/13/13 K 4.5 Creatinine 1.69 Labs: 06/18/13 K 4.4 Creatinine 1.79 Labs: 5/15 K 4.5, creatinine 2.48, LFTs normal, TSH 4.6 (elevated), BNP 1514 (lower) Labs 08/28/13 K 4.5 Creatinine 2.8  LAbs 10/11/13 K 3.5 Creatinine 2.3 Hgb 8.7  Labs 11/13/13: K 4.2, creatinine 2.59 Labs 11/29/13 K 4.3 Creatinine 1.48  Labs 12/27/13 INR 6.4    SH: Lives in [place] with family. Never smoked. She does drink alcohol.   FH: Father and brother had MI  ROS: All systems negative except as listed in HPI, PMH and Problem List.  Past Medical History  Diagnosis Date  . COPD (chronic obstructive pulmonary disease)   . History of TIA (transient ischemic attack)     Diagnosed 2003  . History of stroke     2000 [place]  . Obesity   . Juvenile rheumatic fever     Age 67  . Essential hypertension, benign   . Hyperlipidemia   . Mitral regurgitation     Status post bioprosthetic MVR (05/2013)  . CKD (chronic kidney disease) stage 4, GFR 15-29 ml/min   . Paroxysmal atrial fibrillation     s/p MAZE (05/2013)  . Chronic diastolic congestive heart failure     a) ECHO (09/2013) EF 50-55%  . Arthritis   . Aortic insufficiency due to bicuspid aortic valve     Moderate by TEE  . S/P minimally invasive mitral valve replacement with bioprosthetic valve and maze procedure 05/16/2013    27 mm Edwards magna mitral  bovine bioprosthetic tissue valve placed via right thoracotomy  . S/P Maze operation for atrial fibrillation 05/16/2013    Complete bilateral atrial lesion set using cryothermy and bipolar radiofrequency ablation with oversewing of LA appendage  . NICM (nonischemic cardiomyopathy)     a) LHC (04/2013) no significant CAD    Current Outpatient Prescriptions  Medication Sig Dispense Refill  . amiodarone (PACERONE) 100 MG tablet Take 1 tablet (100 mg total) by mouth daily.  30 tablet  0   . colchicine 0.6 MG tablet Take 0.5 tablets (0.3 mg total) by mouth daily.  30 tablet  0  . HYDROcodone-acetaminophen (NORCO/VICODIN) 5-325 MG per tablet Take 1 tablet by mouth every 6 (six) hours as needed for moderate pain.  30 tablet  0  . oxybutynin (DITROPAN-XL) 5 MG 24 hr tablet Take 5 mg by mouth at bedtime.      . potassium chloride SA (K-DUR,KLOR-CON) 20 MEQ tablet Take 1 tablet (20 mEq total) by mouth daily.  60 tablet  6  . simvastatin (ZOCOR) 10 MG tablet TAKE ONE TABLET BY MOUTH EVERY EVENING.  30 tablet  3  . torsemide (DEMADEX) 20 MG tablet Take 80 mg by mouth every morning.      . warfarin (COUMADIN) 2.5 MG tablet Take 1.25-2.5 mg by mouth See admin instructions. Takes one tablet (2.5mg  total) on all days except on Friday. Takes one-half tablet (1.25mg  total) on Fridays only       No current facility-administered medications for this encounter.    Filed Vitals:   12/31/13 1053  BP: 142/83  Pulse: 65  Resp: 18  Weight: 175 lb 6 oz (79.55 kg)  SpO2: 84%    PHYSICAL EXAM: General:  Elderly; NAD. No resp difficulty. Ambulated in the clinic with a walker. Daughter and  granddaughter present HEENT: normal Neck: supple. JVD 6-7; Carotids 2+ bilaterally; no bruits. No lymphadenopathy or thryomegaly appreciated. Cor: PMI normal. Irregular rate & rhythm. No rubs, gallops or murmurs. Lungs: diminished in the bases Abdomen: soft, nontender, nondistended. No hepatosplenomegaly. No bruits or masses. Good bowel sounds. Extremities: no cyanosis, clubbing, rash, trace -1+ ankle bilaterally edema.   Neuro: alert & orientedx3, cranial nerves grossly intact. Moves all 4 extremities w/o difficulty. Affect pleasant.   ASSESSMENT & PLAN:  1. Chronic primarily diastolic HF with history of prominent RV failure: EF 50-55% (ECHO 09/2013)   - NYHA II-III symptoms and volume status stable. Continue torsemide 80 mg daily with 20 meq of potassium daily. Will check BMET.  - Reinforced the need  and importance of daily weights, a low sodium diet, and fluid restriction (less than 2 L a day). Instructed to call the HF clinic if weight increases more than 3 lbs overnight or 5 lbs in a week.  2. PAF: S/p Maze with MV repair (05/2013) Appears to be in NSR today. Will continue amiodarone 100 mg daily. On Coumadin which is managed by [facility] office. Having difficulty with INR .   Check INR today and forward to Coumadin Clinic 3. CKD stage III-IV: baseline creatinine 1.8-2.2. Will check BMET today. 4. History of MR:  S/p bioprosthetic MVR, stable by recent ECHO 5. T12-L1 fracture in region of prior fusion s/p mechanical fall without syncope. Underwent vertebroplasty (11/16/13). Ambulating independently 6. DNR- Completed Gold DNR form in the past in clinic.   Follow up in 6 weeks wit Dr Aundra Dubin. Check TSH and LFTs at that time  CLEGG,AMY NP-C  12/31/2013

## 2014-01-03 ENCOUNTER — Ambulatory Visit (INDEPENDENT_AMBULATORY_CARE_PROVIDER_SITE_OTHER): Payer: Commercial Managed Care - HMO | Admitting: *Deleted

## 2014-01-03 DIAGNOSIS — Z8679 Personal history of other diseases of the circulatory system: Secondary | ICD-10-CM

## 2014-01-03 DIAGNOSIS — I5043 Acute on chronic combined systolic (congestive) and diastolic (congestive) heart failure: Secondary | ICD-10-CM

## 2014-01-03 DIAGNOSIS — I639 Cerebral infarction, unspecified: Secondary | ICD-10-CM

## 2014-01-03 DIAGNOSIS — Z953 Presence of xenogenic heart valve: Secondary | ICD-10-CM

## 2014-01-03 DIAGNOSIS — Z9889 Other specified postprocedural states: Secondary | ICD-10-CM

## 2014-01-03 DIAGNOSIS — Z7901 Long term (current) use of anticoagulants: Secondary | ICD-10-CM

## 2014-01-03 DIAGNOSIS — I4891 Unspecified atrial fibrillation: Secondary | ICD-10-CM

## 2014-01-03 LAB — POCT INR: INR: 5

## 2014-01-10 ENCOUNTER — Ambulatory Visit (INDEPENDENT_AMBULATORY_CARE_PROVIDER_SITE_OTHER): Payer: Commercial Managed Care - HMO | Admitting: *Deleted

## 2014-01-10 DIAGNOSIS — Z8679 Personal history of other diseases of the circulatory system: Secondary | ICD-10-CM

## 2014-01-10 DIAGNOSIS — Z7901 Long term (current) use of anticoagulants: Secondary | ICD-10-CM

## 2014-01-10 DIAGNOSIS — Z9889 Other specified postprocedural states: Secondary | ICD-10-CM

## 2014-01-10 DIAGNOSIS — I639 Cerebral infarction, unspecified: Secondary | ICD-10-CM

## 2014-01-10 DIAGNOSIS — I5043 Acute on chronic combined systolic (congestive) and diastolic (congestive) heart failure: Secondary | ICD-10-CM

## 2014-01-10 DIAGNOSIS — I4891 Unspecified atrial fibrillation: Secondary | ICD-10-CM

## 2014-01-10 DIAGNOSIS — Z953 Presence of xenogenic heart valve: Secondary | ICD-10-CM

## 2014-01-10 LAB — POCT INR: INR: 2.7

## 2014-01-15 ENCOUNTER — Other Ambulatory Visit: Payer: Self-pay | Admitting: Internal Medicine

## 2014-01-22 ENCOUNTER — Ambulatory Visit (INDEPENDENT_AMBULATORY_CARE_PROVIDER_SITE_OTHER): Payer: Commercial Managed Care - HMO | Admitting: *Deleted

## 2014-01-22 DIAGNOSIS — I5043 Acute on chronic combined systolic (congestive) and diastolic (congestive) heart failure: Secondary | ICD-10-CM

## 2014-01-22 DIAGNOSIS — I639 Cerebral infarction, unspecified: Secondary | ICD-10-CM

## 2014-01-22 DIAGNOSIS — Z7901 Long term (current) use of anticoagulants: Secondary | ICD-10-CM

## 2014-01-22 DIAGNOSIS — Z8679 Personal history of other diseases of the circulatory system: Secondary | ICD-10-CM

## 2014-01-22 DIAGNOSIS — I4891 Unspecified atrial fibrillation: Secondary | ICD-10-CM

## 2014-01-22 DIAGNOSIS — Z953 Presence of xenogenic heart valve: Secondary | ICD-10-CM

## 2014-01-22 DIAGNOSIS — Z9889 Other specified postprocedural states: Secondary | ICD-10-CM

## 2014-01-22 LAB — POCT INR: INR: 3

## 2014-02-05 ENCOUNTER — Ambulatory Visit (INDEPENDENT_AMBULATORY_CARE_PROVIDER_SITE_OTHER): Payer: Commercial Managed Care - HMO | Admitting: *Deleted

## 2014-02-05 DIAGNOSIS — Z8679 Personal history of other diseases of the circulatory system: Secondary | ICD-10-CM

## 2014-02-05 DIAGNOSIS — Z7901 Long term (current) use of anticoagulants: Secondary | ICD-10-CM

## 2014-02-05 DIAGNOSIS — Z9889 Other specified postprocedural states: Secondary | ICD-10-CM

## 2014-02-05 DIAGNOSIS — I639 Cerebral infarction, unspecified: Secondary | ICD-10-CM

## 2014-02-05 DIAGNOSIS — I4891 Unspecified atrial fibrillation: Secondary | ICD-10-CM

## 2014-02-05 DIAGNOSIS — I5043 Acute on chronic combined systolic (congestive) and diastolic (congestive) heart failure: Secondary | ICD-10-CM

## 2014-02-05 DIAGNOSIS — Z953 Presence of xenogenic heart valve: Secondary | ICD-10-CM

## 2014-02-05 LAB — POCT INR: INR: 2.3

## 2014-02-11 ENCOUNTER — Encounter (HOSPITAL_COMMUNITY): Payer: Commercial Managed Care - HMO

## 2014-02-14 ENCOUNTER — Encounter (HOSPITAL_COMMUNITY): Payer: Self-pay | Admitting: Interventional Cardiology

## 2014-02-15 ENCOUNTER — Emergency Department (HOSPITAL_COMMUNITY)
Admission: EM | Admit: 2014-02-15 | Discharge: 2014-02-15 | Disposition: A | Discharge status: Home / Self Care | Payer: Medicare HMO | Attending: Emergency Medicine | Admitting: Emergency Medicine

## 2014-02-15 ENCOUNTER — Telehealth (HOSPITAL_COMMUNITY): Payer: Self-pay | Admitting: Vascular Surgery

## 2014-02-15 ENCOUNTER — Emergency Department (HOSPITAL_COMMUNITY): Payer: Medicare HMO

## 2014-02-15 ENCOUNTER — Encounter (HOSPITAL_COMMUNITY): Payer: Self-pay | Admitting: Emergency Medicine

## 2014-02-15 DIAGNOSIS — R079 Chest pain, unspecified: Secondary | ICD-10-CM | POA: Diagnosis present

## 2014-02-15 DIAGNOSIS — I129 Hypertensive chronic kidney disease with stage 1 through stage 4 chronic kidney disease, or unspecified chronic kidney disease: Secondary | ICD-10-CM | POA: Diagnosis not present

## 2014-02-15 DIAGNOSIS — R011 Cardiac murmur, unspecified: Secondary | ICD-10-CM | POA: Insufficient documentation

## 2014-02-15 DIAGNOSIS — Z79899 Other long term (current) drug therapy: Secondary | ICD-10-CM | POA: Insufficient documentation

## 2014-02-15 DIAGNOSIS — I5032 Chronic diastolic (congestive) heart failure: Secondary | ICD-10-CM | POA: Insufficient documentation

## 2014-02-15 DIAGNOSIS — E785 Hyperlipidemia, unspecified: Secondary | ICD-10-CM | POA: Insufficient documentation

## 2014-02-15 DIAGNOSIS — Z7901 Long term (current) use of anticoagulants: Secondary | ICD-10-CM | POA: Insufficient documentation

## 2014-02-15 DIAGNOSIS — N184 Chronic kidney disease, stage 4 (severe): Secondary | ICD-10-CM | POA: Diagnosis not present

## 2014-02-15 DIAGNOSIS — R635 Abnormal weight gain: Secondary | ICD-10-CM

## 2014-02-15 DIAGNOSIS — J449 Chronic obstructive pulmonary disease, unspecified: Secondary | ICD-10-CM | POA: Insufficient documentation

## 2014-02-15 DIAGNOSIS — N390 Urinary tract infection, site not specified: Secondary | ICD-10-CM | POA: Insufficient documentation

## 2014-02-15 DIAGNOSIS — Z8673 Personal history of transient ischemic attack (TIA), and cerebral infarction without residual deficits: Secondary | ICD-10-CM | POA: Diagnosis not present

## 2014-02-15 LAB — PRO B NATRIURETIC PEPTIDE: Pro B Natriuretic peptide (BNP): 2802 pg/mL — ABNORMAL HIGH (ref 0–125)

## 2014-02-15 LAB — URINALYSIS, ROUTINE W REFLEX MICROSCOPIC
Bilirubin Urine: NEGATIVE
Glucose, UA: NEGATIVE mg/dL
HGB URINE DIPSTICK: NEGATIVE
Ketones, ur: NEGATIVE mg/dL
NITRITE: NEGATIVE
PH: 7 (ref 5.0–8.0)
Protein, ur: NEGATIVE mg/dL
SPECIFIC GRAVITY, URINE: 1.008 (ref 1.005–1.030)
Urobilinogen, UA: 0.2 mg/dL (ref 0.0–1.0)

## 2014-02-15 LAB — CBC WITH DIFFERENTIAL/PLATELET
Basophils Absolute: 0 10*3/uL (ref 0.0–0.1)
Basophils Relative: 1 % (ref 0–1)
Eosinophils Absolute: 0.2 10*3/uL (ref 0.0–0.7)
Eosinophils Relative: 4 % (ref 0–5)
HEMATOCRIT: 28.5 % — AB (ref 36.0–46.0)
HEMOGLOBIN: 8.6 g/dL — AB (ref 12.0–15.0)
LYMPHS PCT: 15 % (ref 12–46)
Lymphs Abs: 0.6 10*3/uL — ABNORMAL LOW (ref 0.7–4.0)
MCH: 29.1 pg (ref 26.0–34.0)
MCHC: 30.2 g/dL (ref 30.0–36.0)
MCV: 96.3 fL (ref 78.0–100.0)
MONO ABS: 0.5 10*3/uL (ref 0.1–1.0)
MONOS PCT: 13 % — AB (ref 3–12)
NEUTROS ABS: 2.5 10*3/uL (ref 1.7–7.7)
Neutrophils Relative %: 67 % (ref 43–77)
Platelets: 174 10*3/uL (ref 150–400)
RBC: 2.96 MIL/uL — AB (ref 3.87–5.11)
RDW: 16.5 % — ABNORMAL HIGH (ref 11.5–15.5)
WBC: 3.7 10*3/uL — AB (ref 4.0–10.5)

## 2014-02-15 LAB — BASIC METABOLIC PANEL
Anion gap: 13 (ref 5–15)
BUN: 20 mg/dL (ref 6–23)
CHLORIDE: 100 meq/L (ref 96–112)
CO2: 29 meq/L (ref 19–32)
CREATININE: 1.72 mg/dL — AB (ref 0.50–1.10)
Calcium: 9.2 mg/dL (ref 8.4–10.5)
GFR calc Af Amer: 33 mL/min — ABNORMAL LOW (ref >90)
GFR calc non Af Amer: 28 mL/min — ABNORMAL LOW (ref >90)
GLUCOSE: 126 mg/dL — AB (ref 70–99)
POTASSIUM: 4.2 meq/L (ref 3.7–5.3)
Sodium: 142 mEq/L (ref 137–147)

## 2014-02-15 LAB — URINE MICROSCOPIC-ADD ON

## 2014-02-15 LAB — I-STAT TROPONIN, ED: TROPONIN I, POC: 0.02 ng/mL (ref 0.00–0.08)

## 2014-02-15 MED ORDER — FUROSEMIDE 10 MG/ML IJ SOLN
40.0000 mg | Freq: Once | INTRAMUSCULAR | Status: AC
  Administered 2014-02-15: 40 mg via INTRAVENOUS
  Filled 2014-02-15: qty 4

## 2014-02-15 NOTE — Discharge Instructions (Signed)

## 2014-02-15 NOTE — Telephone Encounter (Signed)
Left message to call back  

## 2014-02-15 NOTE — Telephone Encounter (Signed)
[name] [name] with [facility] called to inform CHF clinic of 4 lbs weight gain and pt is currently in the ER

## 2014-02-15 NOTE — ED Notes (Signed)
Pt c/o chest tightness and increased swelling in legs; pt sts 3lb weight gain in 2 days; pt sts pain in vaginal area and thinks she could have an infection; pt denies pain in chest of SOB; pt on home O2

## 2014-02-15 NOTE — ED Notes (Signed)
Pt made aware to return if symptoms worsen or if any life threatening symptoms occur.   

## 2014-02-15 NOTE — Telephone Encounter (Signed)
Pt grand daughter called pt has gained 4 lbs in 4 days... Please advise

## 2014-02-15 NOTE — ED Provider Notes (Signed)
CSN: [id number]     Arrival date & time 02/15/14  1002 History   First MD Initiated Contact with Patient 02/15/14 1042     Chief Complaint  Patient presents with  . Chest Pain  . Urinary Tract Infection     (Consider location/radiation/quality/duration/timing/severity/associated sxs/prior Treatment) Patient is a 73 y.o. female presenting with frequency. The history is provided by the patient. No language interpreter was used.  Urinary Frequency This is a new problem. The current episode started in the past 7 days. The problem occurs constantly. The problem has been unchanged. Pertinent negatives include no abdominal pain. Nothing aggravates the symptoms. She has tried nothing for the symptoms. The treatment provided no relief.  Pt reports she has increased urination and discomfort in vaginal area.   Pt thinks she may have a urinary tract infection.   Pt also concerned that she has gained 3 pounds in the past 2 days.  Pt denies chest pain.  She is not short of breath.  Past Medical History  Diagnosis Date  . COPD (chronic obstructive pulmonary disease)   . History of TIA (transient ischemic attack)     Diagnosed 2003  . History of stroke     2000 [place]  . Obesity   . Juvenile rheumatic fever     Age 65  . Essential hypertension, benign   . Hyperlipidemia   . Mitral regurgitation     Status post bioprosthetic MVR (05/2013)  . CKD (chronic kidney disease) stage 4, GFR 15-29 ml/min   . Paroxysmal atrial fibrillation     s/p MAZE (05/2013)  . Chronic diastolic congestive heart failure     a) ECHO (09/2013) EF 50-55%  . Arthritis   . Aortic insufficiency due to bicuspid aortic valve     Moderate by TEE  . 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 S/P Maze operation for atrial fibrillation 05/16/2013    Complete bilateral atrial  lesion set using cryothermy and bipolar radiofrequency ablation with oversewing of LA appendage  . NICM (nonischemic cardiomyopathy)     a) LHC (04/2013) no significant CAD   Past Surgical History  Procedure Laterality Date  . Total knee arthroplasty Right   . Abdominal hysterectomy      Cervical Cancer  . Parathyroid/thyroid surgery      Tumor  . Tee without cardioversion N/A 04/06/2013    Procedure: TRANSESOPHAGEAL ECHOCARDIOGRAM (TEE);  Surgeon: [name], MD;  Location: [facility];  Service: Cardiology;  Laterality: N/A;  . Minimally invasive maze procedure N/A 05/16/2013    Procedure: MINIMALLY INVASIVE MAZE PROCEDURE;  Surgeon: Rexene Alberts, MD;  Location: [place];  Service: Open Heart Surgery;  Laterality: N/A;  . Intraoperative transesophageal echocardiogram N/A 05/16/2013    Procedure: INTRAOPERATIVE TRANSESOPHAGEAL ECHOCARDIOGRAM;  Surgeon: Rexene Alberts, MD;  Location: [place];  Service: Open Heart Surgery;  Laterality: N/A;  . Mitral valve replacement Right 05/16/2013    Procedure: MINIMALLY INVASIVE MITRAL VALVE (MV) REPLACEMENT;  Surgeon: Rexene Alberts, MD;  Location: [place];  Service: Open Heart Surgery;  Laterality: Right;  . Tee without cardioversion N/A 06/06/2013    Procedure: TRANSESOPHAGEAL ECHOCARDIOGRAM (TEE);  Surgeon: Larey Dresser, MD;  Location: [place];  Service: Cardiovascular;  Laterality: N/A;  . Cardioversion N/A 06/06/2013    Procedure: CARDIOVERSION;  Surgeon: Larey Dresser, MD;  Location: [facility];  Service: Cardiovascular;  Laterality: N/A;  . Left and right heart catheterization with coronary angiogram N/A 05/04/2013    Procedure: LEFT AND RIGHT HEART CATHETERIZATION WITH CORONARY ANGIOGRAM;  Surgeon: Jettie Booze, MD;  Location: [facility];  Service: Cardiovascular;  Laterality: N/A;   Family History  Problem Relation Age of Onset  . Heart failure Father   . Heart attack Brother    History  Substance Use Topics  . Smoking  status: Never Smoker   . Smokeless tobacco: Never Used  . Alcohol Use: No   OB History    No data available     Review of Systems  Gastrointestinal: Negative for abdominal pain.  Genitourinary: Positive for frequency.  All other systems reviewed and are negative.     Allergies  Indomethacin and Norvasc  Home Medications   Prior to Admission medications   Medication Sig Start Date End Date Taking? Authorizing Provider  amiodarone (PACERONE) 100 MG tablet Take 1 tablet (100 mg total) by mouth daily. 10/11/13   Rande Brunt, NP  colchicine 0.6 MG tablet Take 0.5 tablets (0.3 mg total) by mouth daily. 10/11/13   Rande Brunt, NP  HYDROcodone-acetaminophen (NORCO/VICODIN) 5-325 MG per tablet Take 1 tablet by mouth every 6 (six) hours as needed for moderate pain. 11/21/13   Donne Hazel, MD  oxybutynin (DITROPAN-XL) 5 MG 24 hr tablet Take 5 mg by mouth at bedtime.    Historical Provider, MD  potassium chloride SA (K-DUR,KLOR-CON) 20 MEQ tablet Take 1 tablet (20 mEq total) by mouth daily. 05/30/13   Donielle Liston Alba, PA-C  simvastatin (ZOCOR) 10 MG tablet TAKE ONE TABLET BY MOUTH EVERY EVENING. 12/10/13   Tiffany L Reed, DO  torsemide (DEMADEX) 20 MG tablet Take 80 mg by mouth every morning.    Historical Provider, MD  warfarin (COUMADIN) 2.5 MG tablet Take 1.25-2.5 mg by mouth See admin instructions. Takes one tablet (2.5mg  total) on all days except on Friday. Takes one-half tablet (1.25mg  total) on Fridays only    Historical Provider, MD   BP 108/49 mmHg  Pulse 64  Temp(Src) 98.2 F (36.8 C) (Oral)  Resp 11  SpO2 99% Physical Exam  Constitutional: She is oriented to person, place, and time. She appears well-developed and well-nourished.  HENT:  Head: Normocephalic and atraumatic.  Right Ear: External ear normal.  Left Ear: External ear normal.  Nose: Nose normal.  Mouth/Throat: Oropharynx is clear and moist.  Eyes: EOM are normal.  Neck: Normal range of motion.   Cardiovascular: Normal rate.   Murmur heard. Pulmonary/Chest: Effort normal and breath sounds normal.  Abdominal: Soft. She exhibits no distension.  Musculoskeletal: Normal range of motion.  Neurological: She is alert and oriented to person, place, and time.  Skin: Skin is warm.  Psychiatric: She has a normal mood and affect.  Nursing note and vitals reviewed.   ED Course  Procedures (including critical care time) Labs Review Labs Reviewed  BASIC METABOLIC PANEL - Abnormal; Notable for the following:    Glucose, Bld 126 (*)    Creatinine, Ser 1.72 (*)    GFR calc non Af Amer 28 (*)    GFR calc Af Amer 33 (*)    All other components within normal limits  PRO B NATRIURETIC PEPTIDE - Abnormal; Notable for the following:    Pro B Natriuretic peptide (BNP) 2802.0 (*)    All other components within normal limits  CBC WITH DIFFERENTIAL - Abnormal; Notable  for the following:    WBC 3.7 (*)    RBC 2.96 (*)    Hemoglobin 8.6 (*)    HCT 28.5 (*)    RDW 16.5 (*)    Lymphs Abs 0.6 (*)    Monocytes Relative 13 (*)    All other components within normal limits  URINALYSIS, ROUTINE W REFLEX MICROSCOPIC  I-STAT TROPOININ, ED    Imaging Review Dg Chest 2 View  02/15/2014   CLINICAL DATA:  Acute chest pain.  EXAM: CHEST  2 VIEW  COMPARISON:  November 14, 2013.  FINDINGS: Status post cardiac valve repair. Stable cardiomegaly. No pneumothorax is noted. Mild central pulmonary vascular congestion is noted. Mild right infrahilar opacity is noted concerning for pneumonia or edema. Minimal right pleural effusion is noted. Degenerative changes seen involving the right acromioclavicular joint.  IMPRESSION: Mild central pulmonary vascular congestion is noted. Right infrahilar opacity is noted which may represent pneumonia or edema. Followup radiographs are recommended. Minimal right pleural effusion is noted.   Electronically Signed   By: Sabino Dick M.D.   On: 02/15/2014 11:30     EKG  Interpretation   Date/Time:  Friday February 15 2014 10:17:59 EST Ventricular Rate:  67 PR Interval:    QRS Duration: 112 QT Interval:  456 QTC Calculation: 481 R Axis:   -19 Text Interpretation:  Accelerated Junctional rhythm Incomplete left bundle  branch block ST \\T \ T wave abnormality, consider lateral ischemia  Prolonged QT Abnormal ECG no significant change since July 2015 Confirmed  by Regenia Skeeter  MD, SCOTT ([id number]) on 02/15/2014 10:22:32 AM      MDM  ua no infection  Chest xray  Mild vasc congestion.    Pt given lasix 40 mg Iv.   I spoke with Trish  Who will have office schedule pt for a recheck next week   Final diagnoses:  Chest pain       Fransico Meadow, PA-C 02/15/14 [place], MD 02/15/14 [phone]

## 2014-02-20 ENCOUNTER — Encounter (HOSPITAL_COMMUNITY): Payer: Self-pay | Admitting: *Deleted

## 2014-02-20 ENCOUNTER — Emergency Department (HOSPITAL_COMMUNITY): Payer: Medicare HMO

## 2014-02-20 ENCOUNTER — Emergency Department (HOSPITAL_COMMUNITY)
Admission: EM | Admit: 2014-02-20 | Discharge: 2014-02-20 | Disposition: A | Discharge status: Home / Self Care | Payer: Medicare HMO | Attending: Emergency Medicine | Admitting: Emergency Medicine

## 2014-02-20 DIAGNOSIS — R1032 Left lower quadrant pain: Secondary | ICD-10-CM | POA: Insufficient documentation

## 2014-02-20 DIAGNOSIS — R109 Unspecified abdominal pain: Secondary | ICD-10-CM | POA: Diagnosis present

## 2014-02-20 DIAGNOSIS — Z8673 Personal history of transient ischemic attack (TIA), and cerebral infarction without residual deficits: Secondary | ICD-10-CM | POA: Insufficient documentation

## 2014-02-20 DIAGNOSIS — Z7901 Long term (current) use of anticoagulants: Secondary | ICD-10-CM | POA: Diagnosis not present

## 2014-02-20 DIAGNOSIS — M1990 Unspecified osteoarthritis, unspecified site: Secondary | ICD-10-CM | POA: Insufficient documentation

## 2014-02-20 DIAGNOSIS — N390 Urinary tract infection, site not specified: Secondary | ICD-10-CM | POA: Diagnosis not present

## 2014-02-20 DIAGNOSIS — E785 Hyperlipidemia, unspecified: Secondary | ICD-10-CM | POA: Insufficient documentation

## 2014-02-20 DIAGNOSIS — I5032 Chronic diastolic (congestive) heart failure: Secondary | ICD-10-CM | POA: Diagnosis not present

## 2014-02-20 DIAGNOSIS — R1031 Right lower quadrant pain: Secondary | ICD-10-CM | POA: Diagnosis not present

## 2014-02-20 DIAGNOSIS — Z9889 Other specified postprocedural states: Secondary | ICD-10-CM | POA: Diagnosis not present

## 2014-02-20 DIAGNOSIS — N184 Chronic kidney disease, stage 4 (severe): Secondary | ICD-10-CM | POA: Diagnosis not present

## 2014-02-20 DIAGNOSIS — E669 Obesity, unspecified: Secondary | ICD-10-CM | POA: Diagnosis not present

## 2014-02-20 DIAGNOSIS — I130 Hypertensive heart and chronic kidney disease with heart failure and stage 1 through stage 4 chronic kidney disease, or unspecified chronic kidney disease: Secondary | ICD-10-CM | POA: Insufficient documentation

## 2014-02-20 DIAGNOSIS — Q231 Congenital insufficiency of aortic valve: Secondary | ICD-10-CM | POA: Diagnosis not present

## 2014-02-20 DIAGNOSIS — Z79899 Other long term (current) drug therapy: Secondary | ICD-10-CM | POA: Insufficient documentation

## 2014-02-20 DIAGNOSIS — J449 Chronic obstructive pulmonary disease, unspecified: Secondary | ICD-10-CM | POA: Diagnosis not present

## 2014-02-20 LAB — URINALYSIS, ROUTINE W REFLEX MICROSCOPIC
BILIRUBIN URINE: NEGATIVE
GLUCOSE, UA: NEGATIVE mg/dL
Hgb urine dipstick: NEGATIVE
KETONES UR: NEGATIVE mg/dL
Nitrite: NEGATIVE
PH: 5.5 (ref 5.0–8.0)
Protein, ur: NEGATIVE mg/dL
Specific Gravity, Urine: 1.015 (ref 1.005–1.030)
Urobilinogen, UA: 0.2 mg/dL (ref 0.0–1.0)

## 2014-02-20 LAB — CBC WITH DIFFERENTIAL/PLATELET
BASOS PCT: 1 % (ref 0–1)
Basophils Absolute: 0 10*3/uL (ref 0.0–0.1)
Eosinophils Absolute: 0.2 10*3/uL (ref 0.0–0.7)
Eosinophils Relative: 5 % (ref 0–5)
HCT: 27.5 % — ABNORMAL LOW (ref 36.0–46.0)
HEMOGLOBIN: 8.4 g/dL — AB (ref 12.0–15.0)
Lymphocytes Relative: 18 % (ref 12–46)
Lymphs Abs: 0.8 10*3/uL (ref 0.7–4.0)
MCH: 30.2 pg (ref 26.0–34.0)
MCHC: 30.5 g/dL (ref 30.0–36.0)
MCV: 98.9 fL (ref 78.0–100.0)
Monocytes Absolute: 0.4 10*3/uL (ref 0.1–1.0)
Monocytes Relative: 10 % (ref 3–12)
NEUTROS ABS: 2.9 10*3/uL (ref 1.7–7.7)
NEUTROS PCT: 66 % (ref 43–77)
Platelets: 175 10*3/uL (ref 150–400)
RBC: 2.78 MIL/uL — ABNORMAL LOW (ref 3.87–5.11)
RDW: 16.2 % — AB (ref 11.5–15.5)
WBC: 4.3 10*3/uL (ref 4.0–10.5)

## 2014-02-20 LAB — COMPREHENSIVE METABOLIC PANEL
ALBUMIN: 3 g/dL — AB (ref 3.5–5.2)
ALK PHOS: 130 U/L — AB (ref 39–117)
ALT: 11 U/L (ref 0–35)
AST: 23 U/L (ref 0–37)
Anion gap: 11 (ref 5–15)
BILIRUBIN TOTAL: 0.5 mg/dL (ref 0.3–1.2)
BUN: 19 mg/dL (ref 6–23)
CHLORIDE: 99 meq/L (ref 96–112)
CO2: 32 mEq/L (ref 19–32)
Calcium: 9.2 mg/dL (ref 8.4–10.5)
Creatinine, Ser: 1.92 mg/dL — ABNORMAL HIGH (ref 0.50–1.10)
GFR calc Af Amer: 29 mL/min — ABNORMAL LOW (ref >90)
GFR calc non Af Amer: 25 mL/min — ABNORMAL LOW (ref >90)
Glucose, Bld: 133 mg/dL — ABNORMAL HIGH (ref 70–99)
POTASSIUM: 3.6 meq/L — AB (ref 3.7–5.3)
Sodium: 142 mEq/L (ref 137–147)
Total Protein: 7.1 g/dL (ref 6.0–8.3)

## 2014-02-20 LAB — URINE MICROSCOPIC-ADD ON

## 2014-02-20 MED ORDER — DEXTROSE 5 % IV SOLN
1.0000 g | Freq: Once | INTRAVENOUS | Status: AC
  Administered 2014-02-20: 1 g via INTRAVENOUS
  Filled 2014-02-20: qty 10

## 2014-02-20 MED ORDER — CEPHALEXIN 500 MG PO CAPS: 500.0000 mg | ORAL_CAPSULE | Freq: Four times a day (QID) | ORAL | Status: DC

## 2014-02-20 NOTE — ED Provider Notes (Signed)
CSN: [id number]     Arrival date & time 02/20/14  0913 History  This chart was scribe for Maudry Diego, MD by Judithann Sauger, ED Scribe. The patient was seen in room APA16A/APA16A and the patient's care was started at 9:32 AM.    No chief complaint on file.  Patient is a 73 y.o. female presenting with abdominal pain. The history is provided by the patient (pt c/o vaginal pain and abdominal pain). No language interpreter was used.  Abdominal Pain Pain location:  LLQ and RLQ Pain radiates to:  Suprapubic region Timing:  Constant Progression:  Worsening Relieved by:  None tried Worsened by:  Nothing tried Ineffective treatments:  None tried Associated symptoms: dysuria   Associated symptoms: no chest pain, no cough, no diarrhea, no fatigue and no hematuria    HPI Comments: [name] [name] is a 73 y.o. female who presents to the Emergency Department complaining of a gradually worsening abdominal pain. She reports an associated feeling that her "bladder is falling out". She reports that the pain is on exertion. She states that the pain is between her legs with pressure in the middle of her vagina. She reports that when she urinates, only a small amount comes out. She denies leaking. She reports that the swelling in her leg is normal.   Past Medical History  Diagnosis Date  . COPD (chronic obstructive pulmonary disease)   . History of TIA (transient ischemic attack)     Diagnosed 2003  . History of stroke     2000 [place]  . Obesity   . Juvenile rheumatic fever     Age 34  . Essential hypertension, benign   . Hyperlipidemia   . Mitral regurgitation     Status post bioprosthetic MVR (05/2013)  . CKD (chronic kidney disease) stage 4, GFR 15-29 ml/min   . Paroxysmal atrial fibrillation     s/p MAZE (05/2013)  . Chronic diastolic congestive heart failure     a) ECHO (09/2013) EF 50-55%  . Arthritis   . Aortic insufficiency due to bicuspid aortic valve     Moderate by TEE  . S/P  minimally invasive mitral valve replacement with bioprosthetic valve and maze procedure 05/16/2013    27 mm Edwards magna mitral bovine bioprosthetic tissue valve placed via right thoracotomy  . S/P Maze operation for atrial fibrillation 05/16/2013    Complete bilateral atrial lesion set using cryothermy and bipolar radiofrequency ablation with oversewing of LA appendage  . NICM (nonischemic cardiomyopathy)     a) LHC (04/2013) no significant CAD   Past Surgical History  Procedure Laterality Date  . Total knee arthroplasty Right   . Abdominal hysterectomy      Cervical Cancer  . Parathyroid/thyroid surgery      Tumor  . Tee without cardioversion N/A 04/06/2013    Procedure: TRANSESOPHAGEAL ECHOCARDIOGRAM (TEE);  Surgeon: [name], MD;  [facility];  Service: Cardiology;  Laterality: N/A;  . Minimally invasive maze procedure N/A 05/16/2013    Procedure: MINIMALLY INVASIVE MAZE PROCEDURE;  Surgeon: Rexene Alberts, MD;  Location: [place];  Service: Open Heart Surgery;  Laterality: N/A;  . Intraoperative transesophageal echocardiogram N/A 05/16/2013    Procedure: INTRAOPERATIVE TRANSESOPHAGEAL ECHOCARDIOGRAM;  Surgeon: Rexene Alberts, MD;  Location: [place];  Service: Open Heart Surgery;  Laterality: N/A;  . Mitral valve replacement Right 05/16/2013    Procedure: MINIMALLY INVASIVE MITRAL VALVE (MV) REPLACEMENT;  Surgeon: Rexene Alberts, MD;  Location: [place];  Service: Open Heart Surgery;  Laterality: Right;  . Tee without cardioversion N/A 06/06/2013    Procedure: TRANSESOPHAGEAL ECHOCARDIOGRAM (TEE);  Surgeon: Larey Dresser, MD;  Location: [place];  Service: Cardiovascular;  Laterality: N/A;  . Cardioversion N/A 06/06/2013    Procedure: CARDIOVERSION;  Surgeon: Larey Dresser, MD;  Location: [facility];  Service: Cardiovascular;  Laterality: N/A;  . Left and right heart catheterization with coronary angiogram N/A 05/04/2013    Procedure: LEFT AND RIGHT HEART CATHETERIZATION  WITH CORONARY ANGIOGRAM;  Surgeon: Jettie Booze, MD;  Location: [facility] CATH LAB;  Service: Cardiovascular;  Laterality: N/A;   Family History  Problem Relation Age of Onset  . Heart failure Father   . Heart attack Brother    History  Substance Use Topics  . Smoking status: Never Smoker   . Smokeless tobacco: Never Used  . Alcohol Use: No   OB History    No data available     Review of Systems  Constitutional: Negative for appetite change and fatigue.  HENT: Negative for congestion, ear discharge and sinus pressure.   Eyes: Negative for discharge.  Respiratory: Negative for cough.   Cardiovascular: Negative for chest pain.  Gastrointestinal: Positive for abdominal pain. Negative for diarrhea.  Genitourinary: Positive for dysuria. Negative for frequency and hematuria.  Musculoskeletal: Negative for back pain.  Skin: Negative for rash.  Neurological: Negative for seizures and headaches.  Psychiatric/Behavioral: Negative for hallucinations.      Allergies  Indomethacin and Norvasc  Home Medications   Prior to Admission medications   Medication Sig Start Date End Date Taking? Authorizing Provider  amiodarone (PACERONE) 100 MG tablet Take 1 tablet (100 mg total) by mouth daily. 10/11/13   Rande Brunt, NP  colchicine 0.6 MG tablet Take 0.5 tablets (0.3 mg total) by mouth daily. 10/11/13   Rande Brunt, NP  HYDROcodone-acetaminophen (NORCO/VICODIN) 5-325 MG per tablet Take 1 tablet by mouth every 6 (six) hours as needed for moderate pain. Patient not taking: Reported on 02/15/2014 11/21/13   Donne Hazel, MD  oxybutynin (DITROPAN-XL) 5 MG 24 hr tablet Take 5 mg by mouth at bedtime.    Historical Provider, MD  potassium chloride SA (K-DUR,KLOR-CON) 20 MEQ tablet Take 1 tablet (20 mEq total) by mouth daily. 05/30/13   Donielle Liston Alba, PA-C  simvastatin (ZOCOR) 10 MG tablet TAKE ONE TABLET BY MOUTH EVERY EVENING. 12/10/13   Tiffany L Reed, DO  tolterodine (DETROL LA) 4 MG  24 hr capsule Take 4 mg by mouth daily.    Historical Provider, MD  torsemide (DEMADEX) 20 MG tablet Take 80 mg by mouth every morning.    Historical Provider, MD  warfarin (COUMADIN) 2.5 MG tablet Take 1.25-2.5 mg by mouth See admin instructions. Takes one tablet (2.5mg  total) on all days except on Sundays. Takes one-half tablet (1.25mg  total) on Fridays only    Historical Provider, MD   There were no vitals taken for this visit. Physical Exam  Constitutional: She is oriented to person, place, and time. She appears well-developed.  HENT:  Head: Normocephalic.  Eyes: Conjunctivae and EOM are normal. No scleral icterus.  Neck: Neck supple. No tracheal deviation present. No thyromegaly present.  Cardiovascular: Normal rate and regular rhythm.  Exam reveals no gallop and no friction rub.   No murmur heard. Pulmonary/Chest: No stridor. She has no wheezes. She has no rales. She exhibits no tenderness.  Abdominal: She exhibits distension. There is no tenderness. There is no rebound.  Tenderness over suprapubic Distended abdomen  Genitourinary:  Prolapsed bladder   Musculoskeletal: Normal range of motion. She exhibits no edema.  3+ edema both legs up until her thighs.   Lymphadenopathy:    She has no cervical adenopathy.  Neurological: She is oriented to person, place, and time. She exhibits normal muscle tone. Coordination normal.  Skin: Skin is warm. No rash noted. No erythema.  Psychiatric: She has a normal mood and affect. Her behavior is normal.  Nursing note and vitals reviewed.   ED Course  Procedures (including critical care time) DIAGNOSTIC STUDIES: Oxygen Saturation is 94% on RA, adequate by my interpretation.    COORDINATION OF CARE: 9:36 AM- Pt advised of plan for treatment and pt agrees.   Labs Review Labs Reviewed - No data to display  Imaging Review No results found.   EKG Interpretation None      MDM   Final diagnoses:  None  uti,  tx with keflex and  follow up with pcp next week   I personally performed the services described in this documentation, which was scribed in my presence. The recorded information has been reviewed and is accurate.     Maudry Diego, MD 02/20/14 [phone]

## 2014-02-20 NOTE — ED Notes (Signed)
MD at bedside. 

## 2014-02-20 NOTE — ED Notes (Signed)
Pt states abdominal pressure when urinating.

## 2014-02-20 NOTE — ED Notes (Signed)
MD at the bedside  

## 2014-02-20 NOTE — ED Notes (Signed)
Pt alert & oriented x4, stable gait. Patient given discharge instructions, paperwork & prescription(s). Patient  instructed to stop at the registration desk to finish any additional paperwork. Patient verbalized understanding. Pt left department w/ no further questions. 

## 2014-02-20 NOTE — Discharge Instructions (Signed)
Follow up with your md next week for recheck °

## 2014-02-26 ENCOUNTER — Ambulatory Visit (INDEPENDENT_AMBULATORY_CARE_PROVIDER_SITE_OTHER): Payer: Commercial Managed Care - HMO | Admitting: *Deleted

## 2014-02-26 DIAGNOSIS — I639 Cerebral infarction, unspecified: Secondary | ICD-10-CM

## 2014-02-26 DIAGNOSIS — Z7901 Long term (current) use of anticoagulants: Secondary | ICD-10-CM

## 2014-02-26 DIAGNOSIS — Z8679 Personal history of other diseases of the circulatory system: Secondary | ICD-10-CM

## 2014-02-26 DIAGNOSIS — Z953 Presence of xenogenic heart valve: Secondary | ICD-10-CM

## 2014-02-26 DIAGNOSIS — I5043 Acute on chronic combined systolic (congestive) and diastolic (congestive) heart failure: Secondary | ICD-10-CM

## 2014-02-26 DIAGNOSIS — I4891 Unspecified atrial fibrillation: Secondary | ICD-10-CM

## 2014-02-26 DIAGNOSIS — Z9889 Other specified postprocedural states: Secondary | ICD-10-CM

## 2014-02-26 LAB — POCT INR: INR: 1.5

## 2014-02-26 MED ORDER — AMIODARONE HCL 100 MG PO TABS: 100.0000 mg | ORAL_TABLET | Freq: Every day | ORAL | Status: DC

## 2014-03-05 ENCOUNTER — Encounter: Payer: Self-pay | Admitting: *Deleted

## 2014-03-07 ENCOUNTER — Ambulatory Visit (HOSPITAL_COMMUNITY)
Admission: RE | Admit: 2014-03-07 | Discharge: 2014-03-07 | Disposition: A | Discharge status: Home / Self Care | Payer: Commercial Managed Care - HMO | Source: Ambulatory Visit | Attending: Cardiology | Admitting: Cardiology

## 2014-03-07 ENCOUNTER — Encounter (HOSPITAL_COMMUNITY): Payer: Self-pay

## 2014-03-07 VITALS — BP 110/42 | HR 65 | Wt 182.8 lb

## 2014-03-07 DIAGNOSIS — I340 Nonrheumatic mitral (valve) insufficiency: Secondary | ICD-10-CM

## 2014-03-07 DIAGNOSIS — I480 Paroxysmal atrial fibrillation: Secondary | ICD-10-CM

## 2014-03-07 DIAGNOSIS — N183 Chronic kidney disease, stage 3 unspecified: Secondary | ICD-10-CM

## 2014-03-07 DIAGNOSIS — I509 Heart failure, unspecified: Secondary | ICD-10-CM | POA: Insufficient documentation

## 2014-03-07 DIAGNOSIS — I5032 Chronic diastolic (congestive) heart failure: Secondary | ICD-10-CM

## 2014-03-07 MED ORDER — TORSEMIDE 100 MG PO TABS: 100.0000 mg | ORAL_TABLET | Freq: Every morning | ORAL | Status: DC

## 2014-03-07 NOTE — Patient Instructions (Signed)
INCREASE Torsemide to 100mg  once daily. Take 5 tablets of current prescription. New prescription will be 100mg  tablets, take one of these daily.  Have labs checked week of January 11th.  Follow up 1 month.  Happy New Year!  Do the following things EVERYDAY: 1) Weigh yourself in the morning before breakfast. Write it down and keep it in a log. 2) Take your medicines as prescribed 3) Eat low salt foods-Limit salt (sodium) to 2000 mg per day.  4) Stay as active as you can everyday 5) Limit all fluids for the day to less than 2 liters

## 2014-03-09 NOTE — Progress Notes (Signed)
Patient ID: TYIA BINFORD, female   DOB: [birth year], 74 y.o.   MRN: [id number] Primary Physician: Dr. Quintin Alto  Primary Cardiologist: Dr. Harl Bowie  Cardiac Surgeon: Dr Roxy Manns.   HPI: [name] [name] is a 74 yo obese white female with known history of rheumatic fever during childhood and long standing history of chronic diastolic congestive heart failure. She also has a history of CKD, HTN , CVA 2003,and PAF.  Progressed to severe symptomatic mitral regurgitation with paroxysmal atrial fibrillation and underwent minimally invasive Maze procedure with MV replacement (05/16/13). Was discharged 05/31/13 to [facility] center and weight was 207 lbs.  Pre-operative LHC in 2/15 showed no significant CAD.   Admitted to [facility] 06/04/13 with volume overload. Diuresed with lasix drip, Milrinone and metolazone. As volume improved she was transitioned to torsemide 80 mg daily and spironolactone 25 mg daily. She also had successful DC-CV 06/06/13 for A-flutter with RVR. Discharged on coumadin with INR followed by [facility] cardiology. Discharge weight 191 pounds. Discharged to [facility]   Readmitted to [facility] 10/02/13 with increased SOB and fatigue. Diuresed with IV lasix + dopamine. Her hospital course was complicated by cardiorenal syndrome and fever. The blood cultures came back positive for strep viridans and she was switched to Ceftriaxone. ID was consulted and they did not recommend TEE but recommended TTE. The TTE showed no endocarditis and EF 50-55% and moderate AI. A PICC was placed for IV ceftriaxone for a total of 10 days of treatment, through August 14th. She was discharged back to [facility] with PICC. Also Hospice was considered but she wanted to wait.  Discharge weight was 167 pounds.   Admitted to [facility] after fall and fractured T12-L1 region. Underwent vertebroplasty 11/16/13.   She is stable symptomatically.  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  No dyspnea walking around the house.   Plays bingo and goes to church.  Weight stable at home but up 7 lbs on our scales.  No tachypalpitations.  Wears home oxygen 2 liters continuously.   ECG: NSR, 1st degree AV block, LVH, iLBBB  Labs: 06/13/13 K 4.5 Creatinine 1.69 Labs: 06/18/13 K 4.4 Creatinine 1.79 Labs: 5/15 K 4.5, creatinine 2.48, LFTs normal, TSH 4.6 (elevated), BNP 1514 (lower) Labs 08/28/13 K 4.5 Creatinine 2.8  LAbs 10/11/13 K 3.5 Creatinine 2.3 Hgb 8.7  Labs 11/13/13: K 4.2, creatinine 2.59 Labs 11/29/13 K 4.3 Creatinine 1.48  Labs 12/27/13 INR 6.4  Labs 12/15 K 3.6, creatinine 1.92, pro-BNP 2802, hgb 8.4, LFTs normal  SH: Lives in [place] with family. Never smoked. She does drink alcohol.   FH: Father and brother had MI  ROS: All systems negative except as listed in HPI, PMH and Problem List.  Past Medical History  Diagnosis Date  . COPD (chronic obstructive pulmonary disease)   . History of TIA (transient ischemic attack)     Diagnosed 2003  . History of stroke     [address] [place]  . Obesity   . Juvenile rheumatic fever     Age 21  . Essential hypertension, benign   . Hyperlipidemia   . Mitral regurgitation     Status post bioprosthetic MVR (05/2013)  . CKD (chronic kidney disease) stage 4, GFR 15-29 ml/min   . Paroxysmal atrial fibrillation     s/p MAZE (05/2013)  . Chronic diastolic congestive heart failure     a) ECHO (09/2013) EF 50-55%  . Arthritis   . Aortic insufficiency due to bicuspid aortic  valve     Moderate by TEE  . 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 S/P Maze operation for atrial fibrillation 05/16/2013    Complete bilateral atrial lesion set using cryothermy and bipolar radiofrequency ablation with oversewing of LA appendage  . NICM (nonischemic cardiomyopathy)     a) LHC (04/2013) no significant CAD    Current Outpatient Prescriptions   Medication Sig Dispense Refill  . amiodarone (PACERONE) 100 MG tablet Take 1 tablet (100 mg total) by mouth daily. 30 tablet 3  . colchicine 0.6 MG tablet Take 0.5 tablets (0.3 mg total) by mouth daily. 30 tablet 0  . potassium chloride SA (K-DUR,KLOR-CON) 20 MEQ tablet Take 1 tablet (20 mEq total) by mouth daily. 60 tablet 6  . simvastatin (ZOCOR) 10 MG tablet TAKE ONE TABLET BY MOUTH EVERY EVENING. 30 tablet 3  . tolterodine (DETROL LA) 4 MG 24 hr capsule Take 4 mg by mouth daily.    Marland Kitchen torsemide (DEMADEX) 100 MG tablet Take 1 tablet (100 mg total) by mouth every morning. 90 tablet 3  . warfarin (COUMADIN) 2.5 MG tablet Take 1.25-2.5 mg by mouth See admin instructions. Takes 2.5 mg on Sundays, and takes 1.25 mg the rest of the week     No current facility-administered medications for this encounter.    Filed Vitals:   03/07/14 1359  BP: 110/42  Pulse: 65  Weight: 182 lb 12.8 oz (82.918 kg)  SpO2: 96%    PHYSICAL EXAM: General:  Elderly; NAD. No resp difficulty. Ambulated in the clinic with a walker. Daughter and  granddaughter present HEENT: normal Neck: supple. JVP 9-10; Carotids 2+ bilaterally; no bruits. No lymphadenopathy or thryomegaly appreciated. Cor: PMI normal. Irregular rate & rhythm. No rubs, gallops or murmurs. Lungs: Slight crackles at the bases Abdomen: soft, nontender, nondistended. No hepatosplenomegaly. No bruits or masses. Good bowel sounds. Extremities: no cyanosis, clubbing, rash, 1+ edema 1/2 to knees bilaterally   Neuro: alert & orientedx3, cranial nerves grossly intact. Moves all 4 extremities w/o difficulty. Affect pleasant.  ASSESSMENT & PLAN:  1. Chronic primarily diastolic HF with history of prominent RV failure: EF 50-55% (ECHO 09/2013).  Stable NYHA III symptoms but she does appear volume overloaded.  - Increase torsemide to 100 mg daily with BMET in 10 days.   - Reinforced the need and importance of daily weights, a low sodium diet, and fluid  restriction (less than 2 L a day). Instructed to call the HF clinic if weight increases more than 3 lbs overnight or 5 lbs in a week.  2. PAF: S/p Maze with MV repair (05/2013).  She is in NSR today. Will continue amiodarone 100 mg daily. On Coumadin which is managed by [facility] office. She had recent LFTs, will check TSH today.  She will need regular eye exams.  3. CKD stage III-IV: baseline creatinine 1.8-2.2. Check BMET in 10 days after increase in torsemide.  4. History of MR:  S/p bioprosthetic MVR, stable by last echo. 5. DNR- Completed Gold DNR form in the past in clinic.   [name] [name] 03/09/2014

## 2014-03-12 ENCOUNTER — Ambulatory Visit (INDEPENDENT_AMBULATORY_CARE_PROVIDER_SITE_OTHER): Payer: Commercial Managed Care - HMO | Admitting: *Deleted

## 2014-03-12 DIAGNOSIS — Z9889 Other specified postprocedural states: Secondary | ICD-10-CM | POA: Diagnosis not present

## 2014-03-12 DIAGNOSIS — I4891 Unspecified atrial fibrillation: Secondary | ICD-10-CM | POA: Diagnosis not present

## 2014-03-12 DIAGNOSIS — Z953 Presence of xenogenic heart valve: Secondary | ICD-10-CM

## 2014-03-12 DIAGNOSIS — I5043 Acute on chronic combined systolic (congestive) and diastolic (congestive) heart failure: Secondary | ICD-10-CM

## 2014-03-12 DIAGNOSIS — Z7901 Long term (current) use of anticoagulants: Secondary | ICD-10-CM

## 2014-03-12 DIAGNOSIS — I639 Cerebral infarction, unspecified: Secondary | ICD-10-CM

## 2014-03-12 DIAGNOSIS — Z8679 Personal history of other diseases of the circulatory system: Secondary | ICD-10-CM

## 2014-03-12 LAB — POCT INR: INR: 1.3

## 2014-03-18 DIAGNOSIS — E876 Hypokalemia: Secondary | ICD-10-CM | POA: Diagnosis not present

## 2014-03-18 DIAGNOSIS — D649 Anemia, unspecified: Secondary | ICD-10-CM | POA: Diagnosis not present

## 2014-03-18 DIAGNOSIS — R739 Hyperglycemia, unspecified: Secondary | ICD-10-CM | POA: Diagnosis not present

## 2014-03-18 DIAGNOSIS — E780 Pure hypercholesterolemia: Secondary | ICD-10-CM | POA: Diagnosis not present

## 2014-03-18 DIAGNOSIS — I10 Essential (primary) hypertension: Secondary | ICD-10-CM | POA: Diagnosis not present

## 2014-03-19 ENCOUNTER — Ambulatory Visit (INDEPENDENT_AMBULATORY_CARE_PROVIDER_SITE_OTHER): Payer: Commercial Managed Care - HMO | Admitting: *Deleted

## 2014-03-19 DIAGNOSIS — I4891 Unspecified atrial fibrillation: Secondary | ICD-10-CM

## 2014-03-19 DIAGNOSIS — Z9889 Other specified postprocedural states: Secondary | ICD-10-CM

## 2014-03-19 DIAGNOSIS — I639 Cerebral infarction, unspecified: Secondary | ICD-10-CM | POA: Diagnosis not present

## 2014-03-19 DIAGNOSIS — I5043 Acute on chronic combined systolic (congestive) and diastolic (congestive) heart failure: Secondary | ICD-10-CM | POA: Diagnosis not present

## 2014-03-19 DIAGNOSIS — Z7901 Long term (current) use of anticoagulants: Secondary | ICD-10-CM

## 2014-03-19 DIAGNOSIS — Z953 Presence of xenogenic heart valve: Secondary | ICD-10-CM

## 2014-03-19 DIAGNOSIS — Z8679 Personal history of other diseases of the circulatory system: Secondary | ICD-10-CM

## 2014-03-19 LAB — POCT INR: INR: 1.9

## 2014-03-22 ENCOUNTER — Telehealth (HOSPITAL_COMMUNITY): Payer: Self-pay | Admitting: Vascular Surgery

## 2014-03-22 MED ORDER — METOLAZONE 2.5 MG PO TABS: 2.5000 mg | ORAL_TABLET | ORAL | Status: DC

## 2014-03-22 NOTE — Telephone Encounter (Signed)
Pt's daughter aware and verbalizes understanding, rx sent in, if wt not down she will call back

## 2014-03-22 NOTE — Telephone Encounter (Signed)
Spoke w/pt's daughter she states pt has been taking Torsemide 100 mg daily, yesterday she took an extra 20 mg but weight is up even more today, she states pt was a little more SOB yesterday but hasn't complained about it today, she states pt does have edema in her legs and feet, will discuss with Dr Aundra Dubin and call her back

## 2014-03-22 NOTE — Telephone Encounter (Signed)
Pt daughter called weight has been going up.. 177.2 yesterday pt took an extra fluid pil this morning weight is 179 she gain 2 lbs over night

## 2014-03-22 NOTE — Telephone Encounter (Signed)
Metolazone 2.5 mg x 1 before tomorrow am's torsemide.  Take an extra KCl 20 that day also.

## 2014-03-23 DIAGNOSIS — I509 Heart failure, unspecified: Secondary | ICD-10-CM | POA: Diagnosis not present

## 2014-03-26 ENCOUNTER — Ambulatory Visit (INDEPENDENT_AMBULATORY_CARE_PROVIDER_SITE_OTHER): Payer: Commercial Managed Care - HMO | Admitting: *Deleted

## 2014-03-26 DIAGNOSIS — Z9889 Other specified postprocedural states: Secondary | ICD-10-CM | POA: Diagnosis not present

## 2014-03-26 DIAGNOSIS — Z7901 Long term (current) use of anticoagulants: Secondary | ICD-10-CM | POA: Diagnosis not present

## 2014-03-26 DIAGNOSIS — Z953 Presence of xenogenic heart valve: Secondary | ICD-10-CM

## 2014-03-26 DIAGNOSIS — I5043 Acute on chronic combined systolic (congestive) and diastolic (congestive) heart failure: Secondary | ICD-10-CM | POA: Diagnosis not present

## 2014-03-26 DIAGNOSIS — Z8679 Personal history of other diseases of the circulatory system: Secondary | ICD-10-CM

## 2014-03-26 DIAGNOSIS — I639 Cerebral infarction, unspecified: Secondary | ICD-10-CM | POA: Diagnosis not present

## 2014-03-26 DIAGNOSIS — I4891 Unspecified atrial fibrillation: Secondary | ICD-10-CM

## 2014-03-26 LAB — POCT INR: INR: 1.8

## 2014-04-04 ENCOUNTER — Ambulatory Visit (INDEPENDENT_AMBULATORY_CARE_PROVIDER_SITE_OTHER): Payer: Commercial Managed Care - HMO | Admitting: *Deleted

## 2014-04-04 DIAGNOSIS — Z9889 Other specified postprocedural states: Secondary | ICD-10-CM

## 2014-04-04 DIAGNOSIS — I4891 Unspecified atrial fibrillation: Secondary | ICD-10-CM

## 2014-04-04 DIAGNOSIS — Z8679 Personal history of other diseases of the circulatory system: Secondary | ICD-10-CM

## 2014-04-04 DIAGNOSIS — Z953 Presence of xenogenic heart valve: Secondary | ICD-10-CM | POA: Diagnosis not present

## 2014-04-04 DIAGNOSIS — I639 Cerebral infarction, unspecified: Secondary | ICD-10-CM | POA: Diagnosis not present

## 2014-04-04 DIAGNOSIS — Z7901 Long term (current) use of anticoagulants: Secondary | ICD-10-CM | POA: Diagnosis not present

## 2014-04-04 DIAGNOSIS — I5043 Acute on chronic combined systolic (congestive) and diastolic (congestive) heart failure: Secondary | ICD-10-CM | POA: Diagnosis not present

## 2014-04-04 LAB — POCT INR: INR: 2.6

## 2014-04-05 ENCOUNTER — Inpatient Hospital Stay (HOSPITAL_COMMUNITY): Admission: RE | Admit: 2014-04-05 | Payer: Commercial Managed Care - HMO | Source: Ambulatory Visit

## 2014-04-11 DIAGNOSIS — D649 Anemia, unspecified: Secondary | ICD-10-CM | POA: Diagnosis not present

## 2014-04-11 DIAGNOSIS — I10 Essential (primary) hypertension: Secondary | ICD-10-CM | POA: Diagnosis not present

## 2014-04-11 DIAGNOSIS — I5022 Chronic systolic (congestive) heart failure: Secondary | ICD-10-CM | POA: Diagnosis not present

## 2014-04-11 DIAGNOSIS — N184 Chronic kidney disease, stage 4 (severe): Secondary | ICD-10-CM | POA: Diagnosis not present

## 2014-04-11 DIAGNOSIS — Z1389 Encounter for screening for other disorder: Secondary | ICD-10-CM | POA: Diagnosis not present

## 2014-04-11 DIAGNOSIS — E876 Hypokalemia: Secondary | ICD-10-CM | POA: Diagnosis not present

## 2014-04-11 DIAGNOSIS — R5383 Other fatigue: Secondary | ICD-10-CM | POA: Diagnosis not present

## 2014-04-11 DIAGNOSIS — R32 Unspecified urinary incontinence: Secondary | ICD-10-CM | POA: Diagnosis not present

## 2014-04-11 DIAGNOSIS — I482 Chronic atrial fibrillation: Secondary | ICD-10-CM | POA: Diagnosis not present

## 2014-04-15 DIAGNOSIS — R0602 Shortness of breath: Secondary | ICD-10-CM | POA: Diagnosis not present

## 2014-04-15 DIAGNOSIS — M6281 Muscle weakness (generalized): Secondary | ICD-10-CM | POA: Diagnosis not present

## 2014-04-18 ENCOUNTER — Ambulatory Visit (INDEPENDENT_AMBULATORY_CARE_PROVIDER_SITE_OTHER): Payer: Commercial Managed Care - HMO | Admitting: *Deleted

## 2014-04-18 DIAGNOSIS — Z7901 Long term (current) use of anticoagulants: Secondary | ICD-10-CM | POA: Diagnosis not present

## 2014-04-18 DIAGNOSIS — Z9889 Other specified postprocedural states: Secondary | ICD-10-CM

## 2014-04-18 DIAGNOSIS — I4891 Unspecified atrial fibrillation: Secondary | ICD-10-CM | POA: Diagnosis not present

## 2014-04-18 DIAGNOSIS — Z953 Presence of xenogenic heart valve: Secondary | ICD-10-CM

## 2014-04-18 DIAGNOSIS — I5043 Acute on chronic combined systolic (congestive) and diastolic (congestive) heart failure: Secondary | ICD-10-CM | POA: Diagnosis not present

## 2014-04-18 DIAGNOSIS — R0602 Shortness of breath: Secondary | ICD-10-CM | POA: Diagnosis not present

## 2014-04-18 DIAGNOSIS — I638 Other cerebral infarction: Secondary | ICD-10-CM

## 2014-04-18 DIAGNOSIS — Z8679 Personal history of other diseases of the circulatory system: Secondary | ICD-10-CM

## 2014-04-18 DIAGNOSIS — I6389 Other cerebral infarction: Secondary | ICD-10-CM

## 2014-04-18 DIAGNOSIS — M6281 Muscle weakness (generalized): Secondary | ICD-10-CM | POA: Diagnosis not present

## 2014-04-18 LAB — POCT INR: INR: 3

## 2014-04-19 DIAGNOSIS — M6281 Muscle weakness (generalized): Secondary | ICD-10-CM | POA: Diagnosis not present

## 2014-04-19 DIAGNOSIS — R0602 Shortness of breath: Secondary | ICD-10-CM | POA: Diagnosis not present

## 2014-04-22 DIAGNOSIS — R0602 Shortness of breath: Secondary | ICD-10-CM | POA: Diagnosis not present

## 2014-04-22 DIAGNOSIS — M6281 Muscle weakness (generalized): Secondary | ICD-10-CM | POA: Diagnosis not present

## 2014-04-23 DIAGNOSIS — I509 Heart failure, unspecified: Secondary | ICD-10-CM | POA: Diagnosis not present

## 2014-04-24 DIAGNOSIS — M6281 Muscle weakness (generalized): Secondary | ICD-10-CM | POA: Diagnosis not present

## 2014-04-24 DIAGNOSIS — R0602 Shortness of breath: Secondary | ICD-10-CM | POA: Diagnosis not present

## 2014-04-27 DIAGNOSIS — M6281 Muscle weakness (generalized): Secondary | ICD-10-CM | POA: Diagnosis not present

## 2014-04-27 DIAGNOSIS — R0602 Shortness of breath: Secondary | ICD-10-CM | POA: Diagnosis not present

## 2014-04-29 DIAGNOSIS — R0602 Shortness of breath: Secondary | ICD-10-CM | POA: Diagnosis not present

## 2014-04-29 DIAGNOSIS — M6281 Muscle weakness (generalized): Secondary | ICD-10-CM | POA: Diagnosis not present

## 2014-04-30 DIAGNOSIS — R531 Weakness: Secondary | ICD-10-CM | POA: Diagnosis not present

## 2014-04-30 DIAGNOSIS — R0602 Shortness of breath: Secondary | ICD-10-CM | POA: Diagnosis not present

## 2014-04-30 DIAGNOSIS — M6281 Muscle weakness (generalized): Secondary | ICD-10-CM | POA: Diagnosis not present

## 2014-05-03 DIAGNOSIS — M6281 Muscle weakness (generalized): Secondary | ICD-10-CM | POA: Diagnosis not present

## 2014-05-03 DIAGNOSIS — R0602 Shortness of breath: Secondary | ICD-10-CM | POA: Diagnosis not present

## 2014-05-05 DIAGNOSIS — M6281 Muscle weakness (generalized): Secondary | ICD-10-CM | POA: Diagnosis not present

## 2014-05-05 DIAGNOSIS — R0602 Shortness of breath: Secondary | ICD-10-CM | POA: Diagnosis not present

## 2014-05-06 DIAGNOSIS — M6281 Muscle weakness (generalized): Secondary | ICD-10-CM | POA: Diagnosis not present

## 2014-05-06 DIAGNOSIS — R0602 Shortness of breath: Secondary | ICD-10-CM | POA: Diagnosis not present

## 2014-05-07 ENCOUNTER — Telehealth (HOSPITAL_COMMUNITY): Payer: Self-pay | Admitting: Vascular Surgery

## 2014-05-07 ENCOUNTER — Ambulatory Visit (INDEPENDENT_AMBULATORY_CARE_PROVIDER_SITE_OTHER): Payer: Commercial Managed Care - HMO | Admitting: *Deleted

## 2014-05-07 DIAGNOSIS — I639 Cerebral infarction, unspecified: Secondary | ICD-10-CM

## 2014-05-07 DIAGNOSIS — I5043 Acute on chronic combined systolic (congestive) and diastolic (congestive) heart failure: Secondary | ICD-10-CM

## 2014-05-07 DIAGNOSIS — I4891 Unspecified atrial fibrillation: Secondary | ICD-10-CM

## 2014-05-07 DIAGNOSIS — Z8679 Personal history of other diseases of the circulatory system: Secondary | ICD-10-CM

## 2014-05-07 DIAGNOSIS — Z953 Presence of xenogenic heart valve: Secondary | ICD-10-CM

## 2014-05-07 DIAGNOSIS — Z7901 Long term (current) use of anticoagulants: Secondary | ICD-10-CM

## 2014-05-07 DIAGNOSIS — Z9889 Other specified postprocedural states: Secondary | ICD-10-CM

## 2014-05-07 LAB — POCT INR: INR: 1.7

## 2014-05-07 NOTE — Telephone Encounter (Signed)
Spoke w/Julie, RN with [facility] she is at pt's home now and states pt's weight is up about 6 lbs and she does have edema all the way up to her thighs, denies increased SOB, she states VS are all stable.  She has verified meds and states pt is taking Torsemide 20 mg only 1 tab daily and has Metolazone 2.5 mg tabs as needed and she is unsure if she has taken any lately.  The pt's grand daughter keeps up with pt's meds and fixes pill box for pt.  Will try to f/u with pt's grand daughter to verify exactly what pt is taking, she is suppose to be on Torsemide 100 mg daily.

## 2014-05-07 NOTE — Telephone Encounter (Signed)
Pt grand daughter called pt weight has been up for 4 to 5 days she has taken a extra fluid pill, but weight is still up .Marland Kitchen Please advise

## 2014-05-07 NOTE — Telephone Encounter (Signed)
Spoke w/pt's granddaughter, she states pt is taking Torsemide 100 mg daily and that pt has taken an extra 20 mg for the past 3 days.  Pt's weight usually runs 172-174 but has gradually been increasing and has been at 179 lb for the past 3 days, pt has not taken an metolazone.  Advised to have pt take Metolazone in AM 30 min prior to Torsemide and call us back on Thur with up date.  She is agreeable and states pt is not SOB but does have some edema in legs, she will c/b [employer]

## 2014-05-08 ENCOUNTER — Encounter (HOSPITAL_COMMUNITY): Payer: Self-pay

## 2014-05-08 ENCOUNTER — Inpatient Hospital Stay (HOSPITAL_COMMUNITY)
Admission: EM | Admit: 2014-05-08 | Discharge: 2014-05-13 | DRG: 292 | Disposition: A | Discharge status: Home Health | Payer: Commercial Managed Care - HMO | Attending: Internal Medicine | Admitting: Internal Medicine

## 2014-05-08 ENCOUNTER — Emergency Department (HOSPITAL_COMMUNITY): Payer: Commercial Managed Care - HMO

## 2014-05-08 DIAGNOSIS — I340 Nonrheumatic mitral (valve) insufficiency: Secondary | ICD-10-CM | POA: Diagnosis not present

## 2014-05-08 DIAGNOSIS — Z952 Presence of prosthetic heart valve: Secondary | ICD-10-CM | POA: Diagnosis not present

## 2014-05-08 DIAGNOSIS — Z6833 Body mass index (BMI) 33.0-33.9, adult: Secondary | ICD-10-CM

## 2014-05-08 DIAGNOSIS — N184 Chronic kidney disease, stage 4 (severe): Secondary | ICD-10-CM | POA: Diagnosis present

## 2014-05-08 DIAGNOSIS — Z8249 Family history of ischemic heart disease and other diseases of the circulatory system: Secondary | ICD-10-CM | POA: Diagnosis not present

## 2014-05-08 DIAGNOSIS — E785 Hyperlipidemia, unspecified: Secondary | ICD-10-CM | POA: Diagnosis present

## 2014-05-08 DIAGNOSIS — M1990 Unspecified osteoarthritis, unspecified site: Secondary | ICD-10-CM | POA: Diagnosis present

## 2014-05-08 DIAGNOSIS — R7989 Other specified abnormal findings of blood chemistry: Secondary | ICD-10-CM | POA: Diagnosis not present

## 2014-05-08 DIAGNOSIS — E039 Hypothyroidism, unspecified: Secondary | ICD-10-CM | POA: Diagnosis not present

## 2014-05-08 DIAGNOSIS — I5043 Acute on chronic combined systolic (congestive) and diastolic (congestive) heart failure: Secondary | ICD-10-CM | POA: Diagnosis not present

## 2014-05-08 DIAGNOSIS — I5031 Acute diastolic (congestive) heart failure: Secondary | ICD-10-CM

## 2014-05-08 DIAGNOSIS — I480 Paroxysmal atrial fibrillation: Secondary | ICD-10-CM | POA: Diagnosis present

## 2014-05-08 DIAGNOSIS — Z953 Presence of xenogenic heart valve: Secondary | ICD-10-CM

## 2014-05-08 DIAGNOSIS — R0600 Dyspnea, unspecified: Secondary | ICD-10-CM

## 2014-05-08 DIAGNOSIS — J449 Chronic obstructive pulmonary disease, unspecified: Secondary | ICD-10-CM | POA: Diagnosis present

## 2014-05-08 DIAGNOSIS — N183 Chronic kidney disease, stage 3 (moderate): Secondary | ICD-10-CM | POA: Diagnosis not present

## 2014-05-08 DIAGNOSIS — Z8541 Personal history of malignant neoplasm of cervix uteri: Secondary | ICD-10-CM | POA: Diagnosis not present

## 2014-05-08 DIAGNOSIS — I5033 Acute on chronic diastolic (congestive) heart failure: Secondary | ICD-10-CM | POA: Diagnosis present

## 2014-05-08 DIAGNOSIS — E038 Other specified hypothyroidism: Secondary | ICD-10-CM | POA: Diagnosis not present

## 2014-05-08 DIAGNOSIS — J9610 Chronic respiratory failure, unspecified whether with hypoxia or hypercapnia: Secondary | ICD-10-CM | POA: Diagnosis not present

## 2014-05-08 DIAGNOSIS — I272 Other secondary pulmonary hypertension: Secondary | ICD-10-CM | POA: Diagnosis present

## 2014-05-08 DIAGNOSIS — N390 Urinary tract infection, site not specified: Secondary | ICD-10-CM | POA: Diagnosis present

## 2014-05-08 DIAGNOSIS — Z8673 Personal history of transient ischemic attack (TIA), and cerebral infarction without residual deficits: Secondary | ICD-10-CM

## 2014-05-08 DIAGNOSIS — I10 Essential (primary) hypertension: Secondary | ICD-10-CM | POA: Diagnosis not present

## 2014-05-08 DIAGNOSIS — I429 Cardiomyopathy, unspecified: Secondary | ICD-10-CM | POA: Diagnosis present

## 2014-05-08 DIAGNOSIS — D508 Other iron deficiency anemias: Secondary | ICD-10-CM | POA: Diagnosis not present

## 2014-05-08 DIAGNOSIS — Z96651 Presence of right artificial knee joint: Secondary | ICD-10-CM | POA: Diagnosis present

## 2014-05-08 DIAGNOSIS — E669 Obesity, unspecified: Secondary | ICD-10-CM | POA: Diagnosis not present

## 2014-05-08 DIAGNOSIS — I4821 Permanent atrial fibrillation: Secondary | ICD-10-CM | POA: Diagnosis present

## 2014-05-08 DIAGNOSIS — M25512 Pain in left shoulder: Secondary | ICD-10-CM | POA: Diagnosis not present

## 2014-05-08 DIAGNOSIS — R633 Feeding difficulties: Secondary | ICD-10-CM

## 2014-05-08 DIAGNOSIS — R0602 Shortness of breath: Secondary | ICD-10-CM | POA: Diagnosis not present

## 2014-05-08 DIAGNOSIS — I509 Heart failure, unspecified: Secondary | ICD-10-CM | POA: Diagnosis not present

## 2014-05-08 DIAGNOSIS — I129 Hypertensive chronic kidney disease with stage 1 through stage 4 chronic kidney disease, or unspecified chronic kidney disease: Secondary | ICD-10-CM | POA: Diagnosis present

## 2014-05-08 DIAGNOSIS — D631 Anemia in chronic kidney disease: Secondary | ICD-10-CM | POA: Diagnosis present

## 2014-05-08 DIAGNOSIS — R031 Nonspecific low blood-pressure reading: Secondary | ICD-10-CM | POA: Diagnosis not present

## 2014-05-08 DIAGNOSIS — Z7901 Long term (current) use of anticoagulants: Secondary | ICD-10-CM | POA: Diagnosis not present

## 2014-05-08 DIAGNOSIS — I248 Other forms of acute ischemic heart disease: Secondary | ICD-10-CM | POA: Diagnosis not present

## 2014-05-08 DIAGNOSIS — N189 Chronic kidney disease, unspecified: Secondary | ICD-10-CM

## 2014-05-08 DIAGNOSIS — Z9981 Dependence on supplemental oxygen: Secondary | ICD-10-CM | POA: Diagnosis not present

## 2014-05-08 DIAGNOSIS — D509 Iron deficiency anemia, unspecified: Secondary | ICD-10-CM | POA: Diagnosis not present

## 2014-05-08 DIAGNOSIS — R778 Other specified abnormalities of plasma proteins: Secondary | ICD-10-CM | POA: Diagnosis present

## 2014-05-08 DIAGNOSIS — J810 Acute pulmonary edema: Secondary | ICD-10-CM

## 2014-05-08 DIAGNOSIS — R710 Precipitous drop in hematocrit: Secondary | ICD-10-CM | POA: Diagnosis not present

## 2014-05-08 LAB — CBC WITH DIFFERENTIAL/PLATELET
BASOS PCT: 1 % (ref 0–1)
Basophils Absolute: 0 10*3/uL (ref 0.0–0.1)
EOS ABS: 0.1 10*3/uL (ref 0.0–0.7)
Eosinophils Relative: 3 % (ref 0–5)
HCT: 28.6 % — ABNORMAL LOW (ref 36.0–46.0)
Hemoglobin: 8.7 g/dL — ABNORMAL LOW (ref 12.0–15.0)
Lymphocytes Relative: 13 % (ref 12–46)
Lymphs Abs: 0.6 10*3/uL — ABNORMAL LOW (ref 0.7–4.0)
MCH: 28 pg (ref 26.0–34.0)
MCHC: 30.4 g/dL (ref 30.0–36.0)
MCV: 92 fL (ref 78.0–100.0)
Monocytes Absolute: 0.4 10*3/uL (ref 0.1–1.0)
Monocytes Relative: 9 % (ref 3–12)
NEUTROS PCT: 74 % (ref 43–77)
Neutro Abs: 3.3 10*3/uL (ref 1.7–7.7)
Platelets: 167 10*3/uL (ref 150–400)
RBC: 3.11 MIL/uL — AB (ref 3.87–5.11)
RDW: 18.8 % — ABNORMAL HIGH (ref 11.5–15.5)
WBC: 4.4 10*3/uL (ref 4.0–10.5)

## 2014-05-08 LAB — TROPONIN I
Troponin I: 0.06 ng/mL — ABNORMAL HIGH (ref <0.031)
Troponin I: 0.05 ng/mL — ABNORMAL HIGH (ref <0.031)

## 2014-05-08 LAB — BASIC METABOLIC PANEL
Anion gap: 10 (ref 5–15)
BUN: 23 mg/dL (ref 6–23)
CHLORIDE: 102 mmol/L (ref 96–112)
CO2: 29 mmol/L (ref 19–32)
Calcium: 8.8 mg/dL (ref 8.4–10.5)
Creatinine, Ser: 1.98 mg/dL — ABNORMAL HIGH (ref 0.50–1.10)
GFR calc Af Amer: 28 mL/min — ABNORMAL LOW (ref >90)
GFR calc non Af Amer: 24 mL/min — ABNORMAL LOW (ref >90)
GLUCOSE: 92 mg/dL (ref 70–99)
Potassium: 3.6 mmol/L (ref 3.5–5.1)
Sodium: 141 mmol/L (ref 135–145)

## 2014-05-08 LAB — TSH: TSH: 3.508 u[IU]/mL (ref 0.350–4.500)

## 2014-05-08 LAB — PROTIME-INR
INR: 1.67 — ABNORMAL HIGH (ref 0.00–1.49)
PROTHROMBIN TIME: 19.9 s — AB (ref 11.6–15.2)

## 2014-05-08 MED ORDER — HYDROCODONE-ACETAMINOPHEN 5-325 MG PO TABS: 1.0000 | ORAL_TABLET | ORAL | Status: DC | PRN

## 2014-05-08 MED ORDER — FUROSEMIDE 10 MG/ML IJ SOLN
40.0000 mg | Freq: Once | INTRAMUSCULAR | Status: AC
  Administered 2014-05-08: 40 mg via INTRAVENOUS
  Filled 2014-05-08: qty 4

## 2014-05-08 MED ORDER — POTASSIUM CHLORIDE CRYS ER 20 MEQ PO TBCR
40.0000 meq | EXTENDED_RELEASE_TABLET | Freq: Every day | ORAL | Status: DC
  Administered 2014-05-09 – 2014-05-13 (×5): 40 meq via ORAL
  Filled 2014-05-08 (×5): qty 2

## 2014-05-08 MED ORDER — FUROSEMIDE 10 MG/ML IJ SOLN
60.0000 mg | Freq: Three times a day (TID) | INTRAMUSCULAR | Status: DC
  Administered 2014-05-08 – 2014-05-12 (×11): 60 mg via INTRAVENOUS
  Filled 2014-05-08 (×12): qty 6

## 2014-05-08 MED ORDER — ACETAMINOPHEN 325 MG PO TABS: 650.0000 mg | ORAL_TABLET | Freq: Four times a day (QID) | ORAL | Status: DC | PRN

## 2014-05-08 MED ORDER — ASPIRIN 325 MG PO TABS
325.0000 mg | ORAL_TABLET | Freq: Once | ORAL | Status: AC
  Administered 2014-05-08: 325 mg via ORAL
  Filled 2014-05-08: qty 1

## 2014-05-08 MED ORDER — SODIUM CHLORIDE 0.9 % IJ SOLN
3.0000 mL | Freq: Two times a day (BID) | INTRAMUSCULAR | Status: DC
  Administered 2014-05-08 – 2014-05-13 (×10): 3 mL via INTRAVENOUS

## 2014-05-08 MED ORDER — ONDANSETRON HCL 4 MG/2ML IJ SOLN: 4.0000 mg | Freq: Four times a day (QID) | INTRAMUSCULAR | Status: DC | PRN

## 2014-05-08 MED ORDER — FERROUS SULFATE 325 (65 FE) MG PO TABS
325.0000 mg | ORAL_TABLET | Freq: Three times a day (TID) | ORAL | Status: DC
  Administered 2014-05-08 – 2014-05-13 (×14): 325 mg via ORAL
  Filled 2014-05-08 (×14): qty 1

## 2014-05-08 MED ORDER — LEVOTHYROXINE SODIUM 50 MCG PO TABS
50.0000 ug | ORAL_TABLET | Freq: Every day | ORAL | Status: DC
  Administered 2014-05-09 – 2014-05-13 (×5): 50 ug via ORAL
  Filled 2014-05-08 (×5): qty 1

## 2014-05-08 MED ORDER — FESOTERODINE FUMARATE ER 4 MG PO TB24
4.0000 mg | ORAL_TABLET | Freq: Every day | ORAL | Status: DC
  Administered 2014-05-08 – 2014-05-12 (×5): 4 mg via ORAL
  Filled 2014-05-08 (×8): qty 1

## 2014-05-08 MED ORDER — WARFARIN SODIUM 5 MG PO TABS
5.0000 mg | ORAL_TABLET | Freq: Once | ORAL | Status: AC
  Administered 2014-05-08: 5 mg via ORAL
  Filled 2014-05-08: qty 1

## 2014-05-08 MED ORDER — FUROSEMIDE 10 MG/ML IJ SOLN: 20.0000 mg | INTRAMUSCULAR | Status: DC

## 2014-05-08 MED ORDER — WARFARIN - PHARMACIST DOSING INPATIENT
Status: DC
  Administered 2014-05-08 – 2014-05-09 (×2)

## 2014-05-08 MED ORDER — FUROSEMIDE 10 MG/ML IJ SOLN: 60.0000 mg | Freq: Three times a day (TID) | INTRAMUSCULAR | Status: DC

## 2014-05-08 MED ORDER — FUROSEMIDE 10 MG/ML IJ SOLN
60.0000 mg | Freq: Four times a day (QID) | INTRAMUSCULAR | Status: DC
  Administered 2014-05-08: 60 mg via INTRAVENOUS
  Filled 2014-05-08 (×2): qty 6

## 2014-05-08 MED ORDER — ONDANSETRON HCL 4 MG PO TABS: 4.0000 mg | ORAL_TABLET | Freq: Four times a day (QID) | ORAL | Status: DC | PRN

## 2014-05-08 MED ORDER — SIMVASTATIN 10 MG PO TABS
10.0000 mg | ORAL_TABLET | Freq: Every day | ORAL | Status: DC
  Administered 2014-05-08 – 2014-05-12 (×5): 10 mg via ORAL
  Filled 2014-05-08 (×5): qty 1

## 2014-05-08 MED ORDER — ACETAMINOPHEN 650 MG RE SUPP: 650.0000 mg | Freq: Four times a day (QID) | RECTAL | Status: DC | PRN

## 2014-05-08 MED ORDER — SENNA 8.6 MG PO TABS
1.0000 | ORAL_TABLET | Freq: Two times a day (BID) | ORAL | Status: DC
  Administered 2014-05-08 – 2014-05-12 (×8): 8.6 mg via ORAL
  Filled 2014-05-08 (×9): qty 1

## 2014-05-08 MED ORDER — SODIUM CHLORIDE 0.9 % IV SOLN: 250.0000 mL | INTRAVENOUS | Status: DC | PRN

## 2014-05-08 MED ORDER — POTASSIUM CHLORIDE CRYS ER 20 MEQ PO TBCR
20.0000 meq | EXTENDED_RELEASE_TABLET | Freq: Every day | ORAL | Status: DC
  Administered 2014-05-08: 20 meq via ORAL
  Filled 2014-05-08: qty 1

## 2014-05-08 MED ORDER — FUROSEMIDE 10 MG/ML IJ SOLN
60.0000 mg | Freq: Two times a day (BID) | INTRAMUSCULAR | Status: DC
  Filled 2014-05-08: qty 6

## 2014-05-08 MED ORDER — AMIODARONE HCL 200 MG PO TABS
100.0000 mg | ORAL_TABLET | Freq: Every day | ORAL | Status: DC
  Administered 2014-05-08 – 2014-05-13 (×6): 100 mg via ORAL
  Filled 2014-05-08 (×6): qty 1

## 2014-05-08 MED ORDER — COLCHICINE 0.6 MG PO TABS
0.3000 mg | ORAL_TABLET | Freq: Every day | ORAL | Status: DC
  Administered 2014-05-08 – 2014-05-13 (×6): 0.3 mg via ORAL
  Filled 2014-05-08 (×6): qty 1

## 2014-05-08 MED ORDER — ALUM & MAG HYDROXIDE-SIMETH 200-200-20 MG/5ML PO SUSP: 30.0000 mL | Freq: Four times a day (QID) | ORAL | Status: DC | PRN

## 2014-05-08 MED ORDER — SODIUM CHLORIDE 0.9 % IJ SOLN: 3.0000 mL | INTRAMUSCULAR | Status: DC | PRN

## 2014-05-08 MED ORDER — TRAZODONE HCL 50 MG PO TABS
25.0000 mg | ORAL_TABLET | Freq: Every evening | ORAL | Status: DC | PRN
  Administered 2014-05-08: 25 mg via ORAL
  Filled 2014-05-08: qty 1

## 2014-05-08 MED ORDER — SODIUM CHLORIDE 0.9 % IJ SOLN
3.0000 mL | Freq: Two times a day (BID) | INTRAMUSCULAR | Status: DC
  Administered 2014-05-08 – 2014-05-10 (×2): 3 mL via INTRAVENOUS

## 2014-05-08 NOTE — Progress Notes (Signed)
ANTICOAGULATION CONSULT NOTE - Initial Consult  Pharmacy Consult for Coumadin Indication: atrial fibrillation  Allergies  Allergen Reactions  . Indomethacin Other (See Comments)    dizziness  . Norvasc [Amlodipine Besylate] Cough   Patient Measurements: Height: 5\' 2"  (157.5 cm) Weight: 180 lb 3.2 oz (81.738 kg) IBW/kg (Calculated) : 50.1  Vital Signs: Temp: 98.1 F (36.7 C) (03/02 1210) Temp Source: Oral (03/02 1210) BP: 136/53 mmHg (03/02 1244) Pulse Rate: 60 (03/02 1244)  Labs:  Recent Labs  05/07/14 1336 05/08/14 0733 05/08/14 0735  HGB  --  8.7*  --   HCT  --  28.6*  --   PLT  --  167  --   LABPROT  --   --  19.9*  INR 1.7  --  1.67*  CREATININE  --  1.98*  --   TROPONINI  --  0.06*  --    Estimated Creatinine Clearance: 25 mL/min (by C-G formula based on Cr of 1.98).  Medical History: Past Medical History  Diagnosis Date  . COPD (chronic obstructive pulmonary disease)   . History of TIA (transient ischemic attack)     Diagnosed 2003  . History of stroke     2000 [place]  . Obesity   . Juvenile rheumatic fever     Age 89  . Essential hypertension, benign   . Hyperlipidemia   . Mitral regurgitation     Status post bioprosthetic MVR (05/2013)  . CKD (chronic kidney disease) stage 4, GFR 15-29 ml/min   . Paroxysmal atrial fibrillation     s/p MAZE (05/2013)  . Chronic diastolic congestive heart failure     a) ECHO (09/2013) EF 50-55%  . Arthritis   . Aortic insufficiency due to bicuspid aortic valve     Moderate by TEE  . 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 S/P Maze operation for atrial fibrillation 05/16/2013    Complete bilateral atrial lesion set using cryothermy and bipolar radiofrequency ablation with oversewing of LA appendage  . NICM (nonischemic cardiomyopathy)     a) LHC (04/2013) no  significant CAD   Medications:  Prescriptions prior to admission  Medication Sig Dispense Refill Last Dose  . amiodarone (PACERONE) 100 MG tablet Take 1 tablet (100 mg total) by mouth daily. 30 tablet 3 05/07/2014 at Unknown time  . colchicine 0.6 MG tablet Take 0.5 tablets (0.3 mg total) by mouth daily. 30 tablet 0 05/07/2014 at Unknown time  . ferrous sulfate 325 (65 FE) MG EC tablet Take 325 mg by mouth 3 (three) times daily with meals.   05/07/2014 at Unknown time  . levothyroxine (SYNTHROID, LEVOTHROID) 50 MCG tablet Take 50 mcg by mouth daily before breakfast.   05/07/2014 at Unknown time  . metolazone (ZAROXOLYN) 2.5 MG tablet Take 1 tablet (2.5 mg total) by mouth as directed. (Patient taking differently: Take 2.5 mg by mouth daily. ) 5 tablet 3 05/07/2014 at Unknown time  . potassium chloride SA (K-DUR,KLOR-CON) 20 MEQ tablet Take 1 tablet (20 mEq total) by mouth daily. 60 tablet 6 05/07/2014 at Unknown time  . simvastatin (ZOCOR) 10 MG tablet TAKE ONE TABLET BY MOUTH EVERY EVENING. 30 tablet 3 05/07/2014 at Unknown time  . tolterodine (DETROL LA) 4 MG 24 hr capsule Take 4 mg by mouth daily.   05/07/2014 at Unknown time  . torsemide (DEMADEX) 100 MG  tablet Take 1 tablet (100 mg total) by mouth every morning. 90 tablet 3 05/07/2014 at Unknown time  . warfarin (COUMADIN) 2.5 MG tablet Take 2.5 mg by mouth one time only at 6 PM.    05/07/2014 at Unknown time   Assessment: 74yo female on chronic Coumadin for h/o afib.  Reviewed notes from anticoagulation clinic yesterday and it appears INR was 1.7 at that time and Coumadin dose was increased to 2.5mg  PO daily.  INR is still below target of 2-3 today on admission.  Goal of Therapy:  INR 2-3 Monitor platelets by anticoagulation protocol: Yes   Plan:  Coumadin 5mg  po today x 1 to boost INR INR daily until stable  [place] Crane, Sophronia Varney A 05/08/2014,12:57 PM

## 2014-05-08 NOTE — ED Notes (Signed)
Called to give report, Renae to call back when available

## 2014-05-08 NOTE — Consult Note (Signed)
Reason for Consult:short of breath Referring Physician: ED Primary Physician: Dr. Quintin Alto   Primary Cardiologist: Dr. Harl Bowie   Cardiac Surgeon: Dr Roxy Manns.  CHF clinic: Dr. Jaymes Graff is an 74 y.o. female.  HPI: This is a 75 y.o. Female patient of Dr. Harl Bowie who is also followed in the CHF clinic by Dr. Aundra Dubin last seen 02/2014 and Torsemide increased to 173m daily for volume overload. She has a history of chronic diastolic CHF with prominent RV failure EF 50-55% echo 09/2013. NYHA III symptoms. She has PAF S/P Maze with MV repair 05/2013. Was in NSR 02/2014 on Amiodarone 100 mg daily and Coumadin. She has Stage III-IV CKD creatinine 1/8-2.2. No ACE-I due to CKD.  Patient is now living alone at home with her granddaughter arranging her medicines. She admits to dietary indiscretion. She has eaten bacon and egg biscuits from [employer] 4 days straight, [employer] and [employer]. She has gained at least 6 lbs in past several days, increased edema, and dyspnea. No chest pain, palpitations, dizziness or presyncope.Recent diagnosis of Fe def anemia. No GI symptoms or black stools before started FeSO4.  Past Medical History  Diagnosis Date  . COPD (chronic obstructive pulmonary disease)   . History of TIA (transient ischemic attack)     Diagnosed 2003  . History of stroke     2000 [place] . Obesity   . Juvenile rheumatic fever     Age 70  . Essential hypertension, benign   . Hyperlipidemia   . Mitral regurgitation     Status post bioprosthetic MVR (05/2013)  . CKD (chronic kidney disease) stage 4, GFR 15-29 ml/min   . Paroxysmal atrial fibrillation     s/p MAZE (05/2013)  . Chronic diastolic congestive heart failure     a) ECHO (09/2013) EF 50-55%  . Arthritis   . Aortic insufficiency due to bicuspid aortic valve     Moderate by TEE  . S/P minimally invasive mitral valve replacement with bioprosthetic valve and maze procedure 05/16/2013    27 mm Edwards magna mitral bovine  bioprosthetic tissue valve placed via right thoracotomy  . S/P Maze operation for atrial fibrillation 05/16/2013    Complete bilateral atrial lesion set using cryothermy and bipolar radiofrequency ablation with oversewing of LA appendage  . NICM (nonischemic cardiomyopathy)     a) LHC (04/2013) no significant CAD    Past Surgical History  Procedure Laterality Date  . Total knee arthroplasty Right   . Abdominal hysterectomy      Cervical Cancer  . Parathyroid/thyroid surgery      Tumor  . Tee without cardioversion N/A 04/06/2013    Procedure: TRANSESOPHAGEAL ECHOCARDIOGRAM (TEE);  Surgeon: JArnoldo Lenis MD;  [facility];  Service: Cardiology;  Laterality: N/A;  . Minimally invasive maze procedure N/A 05/16/2013    Procedure: MINIMALLY INVASIVE MAZE PROCEDURE;  Surgeon: CRexene Alberts MD;  [place] [facility]  Service: Open Heart Surgery;  Laterality: N/A;  . Intraoperative transesophageal echocardiogram N/A 05/16/2013    Procedure: INTRAOPERATIVE TRANSESOPHAGEAL ECHOCARDIOGRAM;  Surgeon: CRexene Alberts MD;  [place] [facility]  Service: Open Heart Surgery;  Laterality: N/A;  . Mitral valve replacement Right 05/16/2013    Procedure: MINIMALLY INVASIVE MITRAL VALVE (MV) REPLACEMENT;  Surgeon: CRexene Alberts MD;  [place] [facility]  Service: Open Heart Surgery;  Laterality: Right;  . Tee without cardioversion N/A 06/06/2013    Procedure: TRANSESOPHAGEAL ECHOCARDIOGRAM (TEE);  Surgeon: DLarey Dresser  MD;  Location: [place];  Service: Cardiovascular;  Laterality: N/A;  . Cardioversion N/A 06/06/2013    Procedure: CARDIOVERSION;  Surgeon: Larey Dresser, MD;  Location: [facility];  Service: Cardiovascular;  Laterality: N/A;  . Left and right heart catheterization with coronary angiogram N/A 05/04/2013    Procedure: LEFT AND RIGHT HEART CATHETERIZATION WITH CORONARY ANGIOGRAM;  Surgeon: Jettie Booze, MD;  Location: [facility] CATH LAB;  Service: Cardiovascular;  Laterality: N/A;     Family History  Problem Relation Age of Onset  . Heart failure Father   . Heart attack Brother     Social History:  reports that she has never smoked. She has never used smokeless tobacco. She reports that she does not drink alcohol or use illicit drugs.  Allergies:  Allergies  Allergen Reactions  . Indomethacin Other (See Comments)    dizziness  . Norvasc [Amlodipine Besylate] Cough    Medications:  Scheduled Meds: Continuous Infusions: PRN Meds:.   Results for orders placed or performed during the hospital encounter of 05/08/14 (from the past 48 hour(s))  Basic metabolic panel     Status: Abnormal   Collection Time: 05/08/14  7:33 AM  Result Value Ref Range   Sodium 141 135 - 145 mmol/L   Potassium 3.6 3.5 - 5.1 mmol/L   Chloride 102 96 - 112 mmol/L   CO2 29 19 - 32 mmol/L   Glucose, Bld 92 70 - 99 mg/dL   BUN 23 6 - 23 mg/dL   Creatinine, Ser 1.98 (H) 0.50 - 1.10 mg/dL   Calcium 8.8 8.4 - 10.5 mg/dL   GFR calc non Af Amer 24 (L) >90 mL/min   GFR calc Af Amer 28 (L) >90 mL/min    Comment: (NOTE) The eGFR has been calculated using the CKD EPI equation. This calculation has not been validated in all clinical situations. eGFR's persistently <90 mL/min signify possible Chronic Kidney Disease.    Anion gap 10 5 - 15  CBC with Differential/Platelet     Status: Abnormal   Collection Time: 05/08/14  7:33 AM  Result Value Ref Range   WBC 4.4 4.0 - 10.5 K/uL   RBC 3.11 (L) 3.87 - 5.11 MIL/uL   Hemoglobin 8.7 (L) 12.0 - 15.0 g/dL   HCT 28.6 (L) 36.0 - 46.0 %   MCV 92.0 78.0 - 100.0 fL   MCH 28.0 26.0 - 34.0 pg   MCHC 30.4 30.0 - 36.0 g/dL   RDW 18.8 (H) 11.5 - 15.5 %   Platelets 167 150 - 400 K/uL   Neutrophils Relative % 74 43 - 77 %   Neutro Abs 3.3 1.7 - 7.7 K/uL   Lymphocytes Relative 13 12 - 46 %   Lymphs Abs 0.6 (L) 0.7 - 4.0 K/uL   Monocytes Relative 9 3 - 12 %   Monocytes Absolute 0.4 0.1 - 1.0 K/uL   Eosinophils Relative 3 0 - 5 %   Eosinophils  Absolute 0.1 0.0 - 0.7 K/uL   Basophils Relative 1 0 - 1 %   Basophils Absolute 0.0 0.0 - 0.1 K/uL  Troponin I     Status: Abnormal   Collection Time: 05/08/14  7:33 AM  Result Value Ref Range   Troponin I 0.06 (H) <0.031 ng/mL    Comment:        PERSISTENTLY INCREASED TROPONIN VALUES IN THE RANGE OF 0.04-0.49 ng/mL CAN BE SEEN IN:       -UNSTABLE ANGINA       -  CONGESTIVE HEART FAILURE       -MYOCARDITIS       -CHEST TRAUMA       -ARRYHTHMIAS       -LATE PRESENTING MYOCARDIAL INFARCTION       -COPD   CLINICAL FOLLOW-UP RECOMMENDED.     Dg Chest Port 1 View  05/08/2014   CLINICAL DATA:  Short of breath per 2 day  EXAM: PORTABLE CHEST - 1 VIEW  COMPARISON:  02/15/2014  FINDINGS: Severe cardiomegaly. Low lung volumes. Diffuse pulmonary edema. No pneumothorax.  IMPRESSION: Moderate CHF with pulmonary edema.  Worsening since the prior study.   Electronically Signed   By: Marybelle Killings M.D.   On: 05/08/2014 08:24    ROS  See HPI Eyes: Negative Ears:Negative for hearing loss, tinnitus Cardiovascular: Negative for chest pain, palpitations,irregular heartbeat,  near-syncope,  and syncope, claudication, cyanosis,.  Respiratory:   Negative for  hemoptysis,  sleep disturbances due to breathing, sputum production and wheezing.   Endocrine: Negative for cold intolerance and heat intolerance.  Hematologic/Lymphatic: Negative for adenopathy and bleeding problem. Does not bruise/bleed easily.  Musculoskeletal: Negative.   Gastrointestinal: Negative for nausea, vomiting, reflux, abdominal pain, diarrhea, constipation.   Genitourinary: Negative for bladder incontinence, dysuria, flank pain, frequency, hematuria, hesitancy, nocturia and urgency.  Neurological: Negative.  Allergic/Immunologic: Negative for environmental allergies.  Blood pressure 147/57, pulse 67, temperature 98.5 F (36.9 C), temperature source Oral, resp. rate 28, SpO2 100 %. Physical Exam PHYSICAL EXAM: Obese, in no acute  distress. Neck: No JVD, HJR, Bruit, or thyroid enlargement Lungs: Decreased Breath sounds with rales at the bases. Cardiovascular: RRR, PMI not displaced, 2/6 diastolic murmur LSB, 1/6 systolic murmur apex, no bruit, thrill, or heave. Abdomen: BS normal. Soft without organomegaly, masses, lesions or tenderness. Extremities: Plus 2 edema bilaterally up to knees with brawny changes. Decreased distal pulses bilateral SKin: Warm, no lesions or rashes  Musculoskeletal: No deformities Neuro: no focal signs    Assessment/Plan: Acute on Chronic diastolic CHF secondary to dietary indiscretion. EF 50-55% echo 09/2013. Will need admitted for IV diuresis.  PAF: S/P Maze with MV repair 05/2013 on Amio and Coumadin. DCCV 4/15 for aflutter.  CKD stage III-IV. Baseline 1.8-2.2. Creatinine 1.98 today.  HTN  Elevated Troponins probably demand ischemia/CHF.  Anemia: Hgb 8.7 on Coumadin.INR 1.67 Diagnosed with Fe deficiency anemia and started on iron 2 weeks ago as well as B12 shots. No melena  Hypothyroidism: recently started on levothyroxine.  DNR    Ermalinda Barrios 05/08/2014, 9:36 AM    The patient was seen and examined, and I agree with the assessment and plan as documented above, with modifications as noted below. Pt with gradual weight gain and increasing exertional dyspnea over last several days with aforementioned cardiovascular history. She said she enjoys eating chicken pot pies on a daily basis. Will hospitalize for acute decompensated diastolic heart failure and administer IV Lasix 60 mg TID. Mildly elevated troponins indicative of demand ischemia with marked baseline anemia. She is otherwise hemodynamically stable. Would recommend dietary consultation.

## 2014-05-08 NOTE — ED Provider Notes (Signed)
CSN: [id number]     Arrival date & time 05/08/14  3662 History  This chart was scribed for [name] Christen, MD by Einar Pheasant, ED Scribe. This patient was seen in room APA14/APA14 and the patient's care was started at 7:21 AM.    Chief Complaint  Patient presents with  . Shortness of Breath    The history is provided by the patient and medical records. No language interpreter was used.   HPI Comments: [name] [name] is a 74 y.o. female with PMhx of COPD and CHF was brought in by ambulance into the Emergency Department complaining of sudden onset SOB this AM. Pt states that she was laying supine in bed when she started experiencing some difficulty breathing. She states that sitting up helped alleviate the SOB, but it did not relieve it. Pt also admits to gaining 6 lbs and can feel the weight. Not a dialysis pt. Pt denies fever, neck pain, sore throat, visual disturbance, CP, cough, abdominal pain, nausea, emesis, diarrhea, urinary symptoms, back pain, HA, weakness, numbness and rash as associated symptoms.    Pt takes furosemide daily. She states that in the past when she gets like this she receives a shot of Lasix. Pt has not taken any of her prescribed medication today.    Past Medical History  Diagnosis Date  . COPD (chronic obstructive pulmonary disease)   . History of TIA (transient ischemic attack)     Diagnosed 2003  . History of stroke     2000 [place]  . Obesity   . Juvenile rheumatic fever     Age 85  . Essential hypertension, benign   . Hyperlipidemia   . Mitral regurgitation     Status post bioprosthetic MVR (05/2013)  . CKD (chronic kidney disease) stage 4, GFR 15-29 ml/min   . Paroxysmal atrial fibrillation     s/p MAZE (05/2013)  . Chronic diastolic congestive heart failure     a) ECHO (09/2013) EF 50-55%  . Arthritis   . Aortic insufficiency due to bicuspid aortic valve     Moderate by TEE  . S/P minimally invasive mitral valve replacement with bioprosthetic valve and  maze procedure 05/16/2013    27 mm Edwards magna mitral bovine bioprosthetic tissue valve placed via right thoracotomy  . S/P Maze operation for atrial fibrillation 05/16/2013    Complete bilateral atrial lesion set using cryothermy and bipolar radiofrequency ablation with oversewing of LA appendage  . NICM (nonischemic cardiomyopathy)     a) LHC (04/2013) no significant CAD   Past Surgical History  Procedure Laterality Date  . Total knee arthroplasty Right   . Abdominal hysterectomy      Cervical Cancer  . Parathyroid/thyroid surgery      Tumor  . Tee without cardioversion N/A 04/06/2013    Procedure: TRANSESOPHAGEAL ECHOCARDIOGRAM (TEE);  Surgeon: [name], MD;  [facility];  Service: Cardiology;  Laterality: N/A;  . Minimally invasive maze procedure N/A 05/16/2013    Procedure: MINIMALLY INVASIVE MAZE PROCEDURE;  Surgeon: Rexene Alberts, MD;  Location: [place];  Service: Open Heart Surgery;  Laterality: N/A;  . Intraoperative transesophageal echocardiogram N/A 05/16/2013    Procedure: INTRAOPERATIVE TRANSESOPHAGEAL ECHOCARDIOGRAM;  Surgeon: Rexene Alberts, MD;  Location: [place];  Service: Open Heart Surgery;  Laterality: N/A;  . Mitral valve replacement Right 05/16/2013    Procedure: MINIMALLY INVASIVE MITRAL VALVE (MV) REPLACEMENT;  Surgeon: Rexene Alberts, MD;  Location: [place];  Service: Open Heart Surgery;  Laterality: Right;  . Tee without cardioversion N/A 06/06/2013    Procedure: TRANSESOPHAGEAL ECHOCARDIOGRAM (TEE);  Surgeon: Larey Dresser, MD;  Location: [place];  Service: Cardiovascular;  Laterality: N/A;  . Cardioversion N/A 06/06/2013    Procedure: CARDIOVERSION;  Surgeon: Larey Dresser, MD;  Location: [facility] ENDOSCOPY;  Service: Cardiovascular;  Laterality: N/A;  . Left and right heart catheterization with coronary angiogram N/A 05/04/2013    Procedure: LEFT AND RIGHT HEART CATHETERIZATION WITH CORONARY ANGIOGRAM;  Surgeon: Jettie Booze, MD;  Location:  [facility] CATH LAB;  Service: Cardiovascular;  Laterality: N/A;   Family History  Problem Relation Age of Onset  . Heart failure Father   . Heart attack Brother    History  Substance Use Topics  . Smoking status: Never Smoker   . Smokeless tobacco: Never Used  . Alcohol Use: No   OB History    No data available     Review of Systems  Constitutional: Negative for fever and chills.  HENT: Negative for congestion and sore throat.   Eyes: Negative for visual disturbance.  Respiratory: Positive for shortness of breath. Negative for cough.   Cardiovascular: Negative for chest pain and leg swelling.  Gastrointestinal: Negative for nausea, vomiting, abdominal pain and diarrhea.  Genitourinary: Negative for dysuria.  Musculoskeletal: Negative for back pain and neck pain.  Skin: Negative for rash.  Neurological: Negative for headaches.  Hematological: Does not bruise/bleed easily.  Psychiatric/Behavioral: Negative for confusion.      Allergies  Indomethacin and Norvasc  Home Medications   Prior to Admission medications   Medication Sig Start Date End Date Taking? Authorizing Provider  amiodarone (PACERONE) 100 MG tablet Take 1 tablet (100 mg total) by mouth daily. 02/26/14  Yes [name], MD  colchicine 0.6 MG tablet Take 0.5 tablets (0.3 mg total) by mouth daily. 10/11/13  Yes Rande Brunt, NP  ferrous sulfate 325 (65 FE) MG EC tablet Take 325 mg by mouth 3 (three) times daily with meals.   Yes Historical Provider, MD  levothyroxine (SYNTHROID, LEVOTHROID) 50 MCG tablet Take 50 mcg by mouth daily before breakfast.   Yes Historical Provider, MD  metolazone (ZAROXOLYN) 2.5 MG tablet Take 1 tablet (2.5 mg total) by mouth as directed. Patient taking differently: Take 2.5 mg by mouth daily.  03/22/14  Yes Larey Dresser, MD  potassium chloride SA (K-DUR,KLOR-CON) 20 MEQ tablet Take 1 tablet (20 mEq total) by mouth daily. 05/30/13  Yes Donielle Liston Alba, PA-C  simvastatin (ZOCOR)  10 MG tablet TAKE ONE TABLET BY MOUTH EVERY EVENING. 12/10/13  Yes Tiffany L Reed, DO  tolterodine (DETROL LA) 4 MG 24 hr capsule Take 4 mg by mouth daily.   Yes Historical Provider, MD  torsemide (DEMADEX) 100 MG tablet Take 1 tablet (100 mg total) by mouth every morning. 03/07/14  Yes Larey Dresser, MD  warfarin (COUMADIN) 2.5 MG tablet Take 2.5 mg by mouth one time only at 6 PM.    Yes Historical Provider, MD   BP 147/57 mmHg  Pulse 67  Temp(Src) 98.5 F (36.9 C) (Oral)  Resp 28  SpO2 100%  Physical Exam  Constitutional: She is oriented to person, place, and time. She appears well-developed and well-nourished.  Not obviously dyspneic  HENT:  Head: Normocephalic and atraumatic.  Eyes: Conjunctivae and EOM are normal. Pupils are equal, round, and reactive to light.  Neck: Normal range of motion. Neck supple.  Cardiovascular: Normal rate and regular rhythm.   Pulmonary/Chest:  Effort normal and breath sounds normal.  Abdominal: Soft. Bowel sounds are normal.  Musculoskeletal: Normal range of motion.  Peripheral edema bilateral 2-3+  Neurological: She is alert and oriented to person, place, and time.  Skin: Skin is warm and dry.  Psychiatric: She has a normal mood and affect. Her behavior is normal.  Nursing note and vitals reviewed.   ED Course  Procedures (including critical care time)  DIAGNOSTIC STUDIES: Oxygen Saturation is 100% on RA, normal by my interpretation.    COORDINATION OF CARE: 7:23 AM- I suspect that the pt may be in mild CHF. Will order labs and imaging. Pt advised of plan for treatment and pt agrees.  Labs Review Labs Reviewed  BASIC METABOLIC PANEL - Abnormal; Notable for the following:    Creatinine, Ser 1.98 (*)    GFR calc non Af Amer 24 (*)    GFR calc Af Amer 28 (*)    All other components within normal limits  CBC WITH DIFFERENTIAL/PLATELET - Abnormal; Notable for the following:    RBC 3.11 (*)    Hemoglobin 8.7 (*)    HCT 28.6 (*)    RDW  18.8 (*)    Lymphs Abs 0.6 (*)    All other components within normal limits  TROPONIN I - Abnormal; Notable for the following:    Troponin I 0.06 (*)    All other components within normal limits  PROTIME-INR - Abnormal; Notable for the following:    Prothrombin Time 19.9 (*)    INR 1.67 (*)    All other components within normal limits    Imaging Review Dg Chest Port 1 View  05/08/2014   CLINICAL DATA:  Short of breath per 2 day  EXAM: PORTABLE CHEST - 1 VIEW  COMPARISON:  02/15/2014  FINDINGS: Severe cardiomegaly. Low lung volumes. Diffuse pulmonary edema. No pneumothorax.  IMPRESSION: Moderate CHF with pulmonary edema.  Worsening since the prior study.   Electronically Signed   By: Marybelle Killings M.D.   On: 05/08/2014 08:24     EKG Interpretation   Date/Time:  Wednesday May 08 2014 07:18:06 EST Ventricular Rate:  65 PR Interval:  217 QRS Duration: 119 QT Interval:  473 QTC Calculation: 492 R Axis:   -26 Text Interpretation:  Sinus or ectopic atrial rhythm Borderline prolonged  PR interval LVH with secondary repolarization abnormality Borderline  prolonged QT interval Confirmed by Lacinda Axon  MD, Kassidy Frankson ([id number]) on 05/08/2014  7:34:42 AM     CRITICAL CARE Performed by: [name] [name] Total critical care time: 30 Critical care time was exclusive of separately billable procedures and treating other patients. Critical care was necessary to treat or prevent imminent or life-threatening deterioration. Critical care was time spent personally by me on the following activities: development of treatment plan with patient and/or surrogate as well as nursing, discussions with consultants, evaluation of patient's response to treatment, examination of patient, obtaining history from patient or surrogate, ordering and performing treatments and interventions, ordering and review of laboratory studies, ordering and review of radiographic studies, pulse oximetry and re-evaluation of patient's condition. MDM    Final diagnoses:  Dyspnea  Acute on chronic congestive heart failure, unspecified congestive heart failure type    Clinically patient is developing acute on chronic congestive heart failure. IV Lasix. Decided to hold IV nitroglycerin secondary to hypotension  I personally performed the services described in this documentation, which was scribed in my presence. The recorded information has been reviewed and is accurate.    Aaron Edelman  Lacinda Axon, MD 05/08/14 1048

## 2014-05-08 NOTE — H&P (Signed)
[facility] History and Physical  [name] [name] NFA:[id number] DOB: [birth year] DOA: 05/08/2014  Referring physician: cook PCP: [name], MD   Chief Complaint: shortness of breath  HPI: [name] [name] is a very pleasant 74 y.o. female with past medical history rheumatic fever during childhood, long-standing history of chronic diastolic congestive heart failure, chronic kidney disease stage IV hypertension, CVA 2003 and PAF, mitral regurgitation with paroxysmal atrial fibrillation status post MA ZE procedure with mitral valve replacement, resents to the emergency department with the chief complaint gradually worsening shortness of breath. Initial evaluation in the emergency department reveals acute pulmonary edema. Patient is oxygen dependent at home reports a gradual worsening of dyspnea over the last several days. She called her doctor who recommended she take an extra dose of Zaroxolyn which she did with little relief. Associated symptoms include worsening lower extremity edema and worsening orthopnea. He denies chest pain palpitations cough headache dizziness syncope or near-syncope. She uses a rolling walker at home and denies any recent falls. She reports weighing daily and has gained about 6 pounds over the last couple of days. She denies any abdominal pain nausea vomiting diarrhea. She denies dysuria hematuria frequency or urgency. Workup in the emergency department includes basic metabolic panel significant for serum creatinine of 1.98, complete blood count significant for hemoglobin of 8.7, initial troponin 0.06, INR 1.67, EKG Sinus or ectopic atrial rhythm Borderline prolonged PR interval LVH with secondary repolarization abnormality Borderline prolonged QT interval  The emergency department she is given 40 mg of Lasix, at the time my exam she is hemodynamically stable afebrile and not hypoxic.   Review of Systems: The point review of systems completed and all  systems are negative except as indicated in the history of present illness Past Medical History  Diagnosis Date  . COPD (chronic obstructive pulmonary disease)   . History of TIA (transient ischemic attack)     Diagnosed 2003  . History of stroke     2000 [place]  . Obesity   . Juvenile rheumatic fever     Age 68  . Essential hypertension, benign   . Hyperlipidemia   . Mitral regurgitation     Status post bioprosthetic MVR (05/2013)  . CKD (chronic kidney disease) stage 4, GFR 15-29 ml/min   . Paroxysmal atrial fibrillation     s/p MAZE (05/2013)  . Chronic diastolic congestive heart failure     a) ECHO (09/2013) EF 50-55%  . Arthritis   . Aortic insufficiency due to bicuspid aortic valve     Moderate by TEE  . 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 S/P Maze operation for atrial fibrillation 05/16/2013    Complete bilateral atrial lesion set using cryothermy and bipolar radiofrequency ablation with oversewing of LA appendage  . NICM (nonischemic cardiomyopathy)     a) LHC (04/2013) no significant CAD   Past Surgical History  Procedure Laterality Date  . Total knee arthroplasty Right   . Abdominal hysterectomy      Cervical Cancer  . Parathyroid/thyroid surgery      Tumor  . Tee without cardioversion N/A 04/06/2013    Procedure: TRANSESOPHAGEAL ECHOCARDIOGRAM (TEE);  Surgeon: [name], MD;  [facility];  Service: Cardiology;  Laterality: N/A;  . Minimally invasive maze procedure N/A 05/16/2013    Procedure: MINIMALLY INVASIVE MAZE PROCEDURE;  Surgeon:  Rexene Alberts, MD;  Location: [place];  Service: Open Heart Surgery;  Laterality: N/A;  . Intraoperative transesophageal echocardiogram N/A 05/16/2013    Procedure: INTRAOPERATIVE TRANSESOPHAGEAL ECHOCARDIOGRAM;  Surgeon: Rexene Alberts, MD;  Location: [place];   Service: Open Heart Surgery;  Laterality: N/A;  . Mitral valve replacement Right 05/16/2013    Procedure: MINIMALLY INVASIVE MITRAL VALVE (MV) REPLACEMENT;  Surgeon: Rexene Alberts, MD;  Location: [place];  Service: Open Heart Surgery;  Laterality: Right;  . Tee without cardioversion N/A 06/06/2013    Procedure: TRANSESOPHAGEAL ECHOCARDIOGRAM (TEE);  Surgeon: Larey Dresser, MD;  Location: [place];  Service: Cardiovascular;  Laterality: N/A;  . Cardioversion N/A 06/06/2013    Procedure: CARDIOVERSION;  Surgeon: Larey Dresser, MD;  Location: [facility] ENDOSCOPY;  Service: Cardiovascular;  Laterality: N/A;  . Left and right heart catheterization with coronary angiogram N/A 05/04/2013    Procedure: LEFT AND RIGHT HEART CATHETERIZATION WITH CORONARY ANGIOGRAM;  Surgeon: Jettie Booze, MD;  Location: [facility] CATH LAB;  Service: Cardiovascular;  Laterality: N/A;   Social History:  reports that she has never smoked. She has never used smokeless tobacco. She reports that she does not drink alcohol or use illicit drugs. Visit home alone she uses a rolling walker. She is fairly independent with ADLs at home but gets assistance with shopping making appointments she does not drive no recent falls Allergies  Allergen Reactions  . Indomethacin Other (See Comments)    dizziness  . Norvasc [Amlodipine Besylate] Cough    Family History  Problem Relation Age of Onset  . Heart failure Father   . Heart attack Brother      Prior to Admission medications   Medication Sig Start Date End Date Taking? Authorizing Provider  amiodarone (PACERONE) 100 MG tablet Take 1 tablet (100 mg total) by mouth daily. 02/26/14  Yes [name], MD  colchicine 0.6 MG tablet Take 0.5 tablets (0.3 mg total) by mouth daily. 10/11/13  Yes Rande Brunt, NP  ferrous sulfate 325 (65 FE) MG EC tablet Take 325 mg by mouth 3 (three) times daily with meals.   Yes Historical Provider, MD  levothyroxine (SYNTHROID, LEVOTHROID) 50 MCG  tablet Take 50 mcg by mouth daily before breakfast.   Yes Historical Provider, MD  metolazone (ZAROXOLYN) 2.5 MG tablet Take 1 tablet (2.5 mg total) by mouth as directed. Patient taking differently: Take 2.5 mg by mouth daily.  03/22/14  Yes Larey Dresser, MD  potassium chloride SA (K-DUR,KLOR-CON) 20 MEQ tablet Take 1 tablet (20 mEq total) by mouth daily. 05/30/13  Yes Donielle Liston Alba, PA-C  simvastatin (ZOCOR) 10 MG tablet TAKE ONE TABLET BY MOUTH EVERY EVENING. 12/10/13  Yes Tiffany L Reed, DO  tolterodine (DETROL LA) 4 MG 24 hr capsule Take 4 mg by mouth daily.   Yes Historical Provider, MD  torsemide (DEMADEX) 100 MG tablet Take 1 tablet (100 mg total) by mouth every morning. 03/07/14  Yes Larey Dresser, MD  warfarin (COUMADIN) 2.5 MG tablet Take 2.5 mg by mouth one time only at 6 PM.    Yes Historical Provider, MD   Physical Exam: Filed Vitals:   05/08/14 0915 05/08/14 0930 05/08/14 0945 05/08/14 1000  BP:  103/43  122/51  Pulse: 58 58 73 61  Temp:      TempSrc:      Resp: 17 19 19 21   SpO2: 99% 99% 97% 97%    Wt Readings from Last 3 Encounters:  11/21/13 81.194 kg (179 lb)  10/23/13 79.742 kg (175 lb 12.8 oz)  10/17/13 76.431 kg (168 lb 8 oz)    General:  Appears calm and comfortable obese Eyes: PERRL, normal lids, irises & conjunctiva ENT: grossly normal hearing, his membranes of her mouth are pink moist oropharynx without erythema or exudate Neck: no LAD, masses or thyromegaly Cardiovascular: Rate and rhythm, no m/r/g. 1-2+ LE edema extending up to her thighs Respiratory:  Normal respiratory effort but slightly shallow. Breath sounds with fair air movement fine crackles bilaterally up to mid lobes. Abdomen: soft, ntnd positive bowel sounds Skin: Bilateral lower extremities with mild erythema and warmth  Musculoskeletal: grossly normal tone BUE/BLE Psychiatric: grossly normal mood and affect, speech fluent and appropriate Neurologic: grossly non-focal. Speech clear  facial symmetry           Labs on Admission:  Basic Metabolic Panel:  Recent Labs Lab 05/08/14 0733  NA 141  K 3.6  CL 102  CO2 29  GLUCOSE 92  BUN 23  CREATININE 1.98*  CALCIUM 8.8   Liver Function Tests: No results for input(s): AST, ALT, ALKPHOS, BILITOT, PROT, ALBUMIN in the last 168 hours. No results for input(s): LIPASE, AMYLASE in the last 168 hours. No results for input(s): AMMONIA in the last 168 hours. CBC:  Recent Labs Lab 05/08/14 0733  WBC 4.4  NEUTROABS 3.3  HGB 8.7*  HCT 28.6*  MCV 92.0  PLT 167   Cardiac Enzymes:  Recent Labs Lab 05/08/14 0733  TROPONINI 0.06*    BNP (last 3 results) No results for input(s): BNP in the last 8760 hours.  ProBNP (last 3 results)  Recent Labs  10/23/13 0806 11/29/13 0950 02/15/14 1040  PROBNP 1867.0* 2233.0* 2802.0*    CBG: No results for input(s): GLUCAP in the last 168 hours.  Radiological Exams on Admission: Dg Chest Port 1 View  05/08/2014   CLINICAL DATA:  Short of breath per 2 day  EXAM: PORTABLE CHEST - 1 VIEW  COMPARISON:  02/15/2014  FINDINGS: Severe cardiomegaly. Low lung volumes. Diffuse pulmonary edema. No pneumothorax.  IMPRESSION: Moderate CHF with pulmonary edema.  Worsening since the prior study.   Electronically Signed   By: Marybelle Killings M.D.   On: 05/08/2014 08:24    EKG: Independently reviewed see above  Assessment/Plan Principal Problem:   Acute pulmonary edema: Related to acute on chronic systolic and diastolic CHF. Vernard Gambles is on oxygen at 2 L at home. He received 80 mg of Lasix in the emergency department at the time of my exam respiratory effort has improved. Admit to telemetry. Will continue IV Lasix daily weights monitor intake and output. Cardiology consult has been requested. Weight their recommendations Active Problems:  Acute on chronic combined systolic and diastolic CHF (congestive heart failure); last echo in July 2015 mild LVH. Systolic function was normal.The estimated  ejection fraction was in the range of 50% to 55%.Wall motion was normal; there were no regional wall motionabnormalities. Will request echo. Initial troponin somewhat elevated we'll cycle these. She denies chest pain. Home medications include amiodarone, Roxanol and Demadex. Continue the amiodarone hold the Zaroxolyn and Demadex for now. Await cardiology input.   Elevated troponin: Denies chest pain. She was provided with an aspirin in the emergency department. We'll cycle cardiac enzymes monitor on telemetry repeat EKG in the morning.  Chronic kidney disease: Stage IV. Chart review indicates base creatinine range 1.9-2.0. Current creatinine is within that range. Will monitor closely given IV Lasix that is planned.  Monitor urine output    Paroxysmal atrial fibrillation: Rate controlled. INR on admission on 0.6. Will ask pharmacy to dose Coumadin    HTN (hypertension): Appears controlled. Monitor closely given planned use of IV Lasix.    Mitral regurgitation: Status post MA ZE procedure with bowel for placement.       Anemia; chronic disease. in September of last year hemoglobin range 9.9-10.9. Similar last year range 8.4-8.6. Current hemoglobin 8.7. Somewhat of a decline. Will check FOBT. No signs symptoms of active bleeding. Monitor    Obesity; nutritional consult  COPD; are stable at baseline. She is on home oxygen at 2 L. Monitor    Dr Jacinta Shoe Code Status: discussed at length. Patient want to rescind DNR status am be partial i.e. No defibrillation no ventilator DVT Prophylaxis: Family Communication: grandaughter at bedside Disposition Plan: home when ready  Time spent: 16 minutes  [facility] Pager [phone]

## 2014-05-08 NOTE — ED Notes (Signed)
Patient was laying flat in the bed and she started having trouble breathing. Sitting up helped her breathing.  Patient does have pitting edema in lower extremities. Also complaining of left shoulder pain and pain in her back.

## 2014-05-08 NOTE — Progress Notes (Signed)
*  PRELIMINARY RESULTS* Echocardiogram 2D Echocardiogram has been performed.  Leavy Cella 05/08/2014, 4:22 PM

## 2014-05-08 NOTE — Plan of Care (Signed)
Problem: Food- and Nutrition-Related Knowledge Deficit (NB-1.1) Goal: Nutrition education Formal process to instruct or train a patient/client in a skill or to impart knowledge to help patients/clients voluntarily manage or modify food choices and eating behavior to maintain or improve health. Outcome: Completed/Met Date Met:  05/08/14 Nutrition Education Note  RD consulted for nutrition education regarding a Low Sodium diet.   RD provided "Heart Failure Nutrition Therapy" handout from the Academy of [employer]. Reviewed patient's dietary recall. Pt noted that the [employer] sandwiches she admitted to eating was an exception. She says usually she will have for breakfast instant oatmeal or fry an egg with some toast.  Mentioned that the tub oatmeal would be better than the instant oatmeal, but I would still recommend the instant oatmeal over the breakfast sandwiches. We talked about limiting higher sodium foods like fozen meals, canned soup, hot dogs and other types of sausages. Pt does not eat many vegetables. She does like fruit. Mentioned to try to stick to fresh or frozen of both to lower sodium. Pt/sister mentioned that pt puts mayonnaise on a lot of her foods. Told her that the sodium from mayo will add up and if she can try to substitute for lower salt choice i.e low sodium vinaigrette or vinegar/oil she will drasticly reduce her sodium intake. Went over many different salt free flavorings and spices.   Finally, talked about the importance of reading labels and trying to stay ~2000 mg of sodium. Educated patient that if she should really want a higher sodium food she should make sure the rest of her foods for that day are low in sodium.   Expect fair-good compliance.  Body mass index is 32.95 kg/(m^2). Pt meets criteria for Obese based on current BMI.  Current diet order is heart healthy, no meal intake recorded at this time. Labs and medications reviewed. No further nutrition  interventions warranted at this time. RD contact information provided. If additional nutrition issues arise, please re-consult RD.  Burtis Junes RD, LDN Nutrition Pager: [phone] 05/08/2014 3:58 PM

## 2014-05-09 DIAGNOSIS — R710 Precipitous drop in hematocrit: Secondary | ICD-10-CM

## 2014-05-09 DIAGNOSIS — I340 Nonrheumatic mitral (valve) insufficiency: Secondary | ICD-10-CM

## 2014-05-09 DIAGNOSIS — R300 Dysuria: Secondary | ICD-10-CM

## 2014-05-09 DIAGNOSIS — N390 Urinary tract infection, site not specified: Secondary | ICD-10-CM | POA: Diagnosis present

## 2014-05-09 DIAGNOSIS — D509 Iron deficiency anemia, unspecified: Secondary | ICD-10-CM | POA: Diagnosis present

## 2014-05-09 DIAGNOSIS — I480 Paroxysmal atrial fibrillation: Secondary | ICD-10-CM

## 2014-05-09 DIAGNOSIS — R7989 Other specified abnormal findings of blood chemistry: Secondary | ICD-10-CM

## 2014-05-09 DIAGNOSIS — E039 Hypothyroidism, unspecified: Secondary | ICD-10-CM

## 2014-05-09 DIAGNOSIS — I5033 Acute on chronic diastolic (congestive) heart failure: Secondary | ICD-10-CM

## 2014-05-09 LAB — PREPARE RBC (CROSSMATCH)

## 2014-05-09 LAB — BASIC METABOLIC PANEL
Anion gap: 6 (ref 5–15)
BUN: 19 mg/dL (ref 6–23)
CHLORIDE: 102 mmol/L (ref 96–112)
CO2: 31 mmol/L (ref 19–32)
Calcium: 8.3 mg/dL — ABNORMAL LOW (ref 8.4–10.5)
Creatinine, Ser: 1.89 mg/dL — ABNORMAL HIGH (ref 0.50–1.10)
GFR calc Af Amer: 29 mL/min — ABNORMAL LOW (ref >90)
GFR calc non Af Amer: 25 mL/min — ABNORMAL LOW (ref >90)
GLUCOSE: 125 mg/dL — AB (ref 70–99)
POTASSIUM: 3.8 mmol/L (ref 3.5–5.1)
Sodium: 139 mmol/L (ref 135–145)

## 2014-05-09 LAB — CBC
HEMATOCRIT: 25.3 % — AB (ref 36.0–46.0)
HEMATOCRIT: 25.6 % — AB (ref 36.0–46.0)
Hemoglobin: 7.7 g/dL — ABNORMAL LOW (ref 12.0–15.0)
Hemoglobin: 7.7 g/dL — ABNORMAL LOW (ref 12.0–15.0)
MCH: 28.5 pg (ref 26.0–34.0)
MCH: 28.2 pg (ref 26.0–34.0)
MCHC: 30.4 g/dL (ref 30.0–36.0)
MCHC: 30.1 g/dL (ref 30.0–36.0)
MCV: 93.7 fL (ref 78.0–100.0)
MCV: 93.8 fL (ref 78.0–100.0)
Platelets: 165 10*3/uL (ref 150–400)
Platelets: 164 10*3/uL (ref 150–400)
RBC: 2.7 MIL/uL — AB (ref 3.87–5.11)
RBC: 2.73 MIL/uL — AB (ref 3.87–5.11)
RDW: 18.9 % — ABNORMAL HIGH (ref 11.5–15.5)
RDW: 19 % — ABNORMAL HIGH (ref 11.5–15.5)
WBC: 3.5 10*3/uL — AB (ref 4.0–10.5)
WBC: 3.2 10*3/uL — AB (ref 4.0–10.5)

## 2014-05-09 LAB — URINE MICROSCOPIC-ADD ON

## 2014-05-09 LAB — URINALYSIS, ROUTINE W REFLEX MICROSCOPIC
BILIRUBIN URINE: NEGATIVE
Glucose, UA: NEGATIVE mg/dL
Ketones, ur: NEGATIVE mg/dL
Nitrite: POSITIVE — AB
PH: 5.5 (ref 5.0–8.0)
Protein, ur: NEGATIVE mg/dL
Specific Gravity, Urine: 1.02 (ref 1.005–1.030)
UROBILINOGEN UA: 0.2 mg/dL (ref 0.0–1.0)

## 2014-05-09 LAB — VITAMIN B12: Vitamin B-12: >2000 pg/mL — ABNORMAL HIGH (ref 211–911)

## 2014-05-09 LAB — FERRITIN: FERRITIN: 29 ng/mL (ref 10–291)

## 2014-05-09 LAB — IRON AND TIBC
Iron: 31 ug/dL — ABNORMAL LOW (ref 42–145)
SATURATION RATIOS: 11 % — AB (ref 20–55)
TIBC: 287 ug/dL (ref 250–470)
UIBC: 256 ug/dL (ref 125–400)

## 2014-05-09 LAB — PROTIME-INR
INR: 2.04 — ABNORMAL HIGH (ref 0.00–1.49)
Prothrombin Time: 23.2 seconds — ABNORMAL HIGH (ref 11.6–15.2)

## 2014-05-09 LAB — TROPONIN I: TROPONIN I: 0.04 ng/mL — AB (ref <0.031)

## 2014-05-09 MED ORDER — POLYETHYLENE GLYCOL 3350 17 G PO PACK
17.0000 g | PACK | Freq: Every day | ORAL | Status: DC
  Administered 2014-05-09 – 2014-05-12 (×4): 17 g via ORAL
  Filled 2014-05-09 (×4): qty 1

## 2014-05-09 MED ORDER — HYDROCODONE-ACETAMINOPHEN 5-325 MG PO TABS
1.0000 | ORAL_TABLET | ORAL | Status: DC | PRN
  Administered 2014-05-09 – 2014-05-10 (×2): 1 via ORAL
  Filled 2014-05-09 (×2): qty 1

## 2014-05-09 MED ORDER — FUROSEMIDE 10 MG/ML IJ SOLN
20.0000 mg | Freq: Once | INTRAMUSCULAR | Status: AC
  Administered 2014-05-09: 20 mg via INTRAVENOUS
  Filled 2014-05-09: qty 2

## 2014-05-09 MED ORDER — SODIUM CHLORIDE 0.9 % IV SOLN
Freq: Once | INTRAVENOUS | Status: AC
  Administered 2014-05-09: 21:00:00 via INTRAVENOUS

## 2014-05-09 MED ORDER — WARFARIN SODIUM 5 MG PO TABS
2.5000 mg | ORAL_TABLET | Freq: Once | ORAL | Status: AC
  Administered 2014-05-09: 2.5 mg via ORAL
  Filled 2014-05-09: qty 1

## 2014-05-09 MED ORDER — PANTOPRAZOLE SODIUM 40 MG PO TBEC
40.0000 mg | DELAYED_RELEASE_TABLET | Freq: Every day | ORAL | Status: DC
  Administered 2014-05-09 – 2014-05-13 (×5): 40 mg via ORAL
  Filled 2014-05-09 (×5): qty 1

## 2014-05-09 MED ORDER — CIPROFLOXACIN IN D5W 400 MG/200ML IV SOLN
400.0000 mg | INTRAVENOUS | Status: DC
  Administered 2014-05-09 – 2014-05-10 (×2): 400 mg via INTRAVENOUS
  Filled 2014-05-09 (×2): qty 200

## 2014-05-09 NOTE — Progress Notes (Signed)
ANTIBIOTIC CONSULT NOTE - INITIAL  Pharmacy Consult for Cipro Indication: UTI  Allergies  Allergen Reactions  . Indomethacin Other (See Comments)    dizziness  . Norvasc [Amlodipine Besylate] Cough    Patient Measurements: Height: 5\' 2"  (157.5 cm) Weight: 179 lb 14.4 oz (81.602 kg) IBW/kg (Calculated) : 50.1 Adjusted Body Weight:   Vital Signs: Temp: 98 F (36.7 C) (03/03 1514) Temp Source: Oral (03/03 1514) BP: 129/51 mmHg (03/03 1514) Pulse Rate: 72 (03/03 1514) Intake/Output from previous day: 03/02 0701 - 03/03 0700 In: 486 [P.O.:480; I.V.:6] Out: 1175 [Urine:1175] Intake/Output from this shift: Total I/O In: 480 [P.O.:480] Out: 1100 [Urine:1100]  Labs:  Recent Labs  05/08/14 0733 05/09/14 0547 05/09/14 1135 05/09/14 1453  WBC 4.4 3.5*  --  3.2*  HGB 8.7* 7.7*  --  7.7*  PLT 167 165  --  164  CREATININE 1.98*  --  1.89*  --    Estimated Creatinine Clearance: 26.2 mL/min (by C-G formula based on Cr of 1.89). No results for input(s): VANCOTROUGH, VANCOPEAK, VANCORANDOM, GENTTROUGH, GENTPEAK, GENTRANDOM, TOBRATROUGH, TOBRAPEAK, TOBRARND, AMIKACINPEAK, AMIKACINTROU, AMIKACIN in the last 72 hours.   Microbiology: No results found for this or any previous visit (from the past 720 hour(s)).  Medical History: Past Medical History  Diagnosis Date  . COPD (chronic obstructive pulmonary disease)   . History of TIA (transient ischemic attack)     Diagnosed 2003  . History of stroke     2000 [place]  . Obesity   . Juvenile rheumatic fever     Age 55  . Essential hypertension, benign   . Hyperlipidemia   . Mitral regurgitation     Status post bioprosthetic MVR (05/2013)  . CKD (chronic kidney disease) stage 4, GFR 15-29 ml/min   . Paroxysmal atrial fibrillation     s/p MAZE (05/2013)  . Chronic diastolic congestive heart failure     a) ECHO (09/2013) EF 50-55%  . Arthritis   . Aortic insufficiency due to bicuspid aortic valve     Moderate by TEE  . S/P  minimally invasive mitral valve replacement with bioprosthetic valve and maze procedure 05/16/2013    27 mm Edwards magna mitral bovine bioprosthetic tissue valve placed via right thoracotomy  . S/P Maze operation for atrial fibrillation 05/16/2013    Complete bilateral atrial lesion set using cryothermy and bipolar radiofrequency ablation with oversewing of LA appendage  . NICM (nonischemic cardiomyopathy)     a) LHC (04/2013) no significant CAD    Medications:  Scheduled:  . sodium chloride   Intravenous Once  . amiodarone  100 mg Oral Daily  . ciprofloxacin  400 mg Intravenous Q24H  . colchicine  0.3 mg Oral Daily  . ferrous sulfate  325 mg Oral TID WC  . fesoterodine  4 mg Oral Daily  . furosemide  20 mg Intravenous Once  . furosemide  60 mg Intravenous 3 times per day  . levothyroxine  50 mcg Oral QAC breakfast  . pantoprazole  40 mg Oral Daily  . polyethylene glycol  17 g Oral Daily  . potassium chloride SA  40 mEq Oral Daily  . senna  1 tablet Oral BID  . simvastatin  10 mg Oral q1800  . sodium chloride  3 mL Intravenous Q12H  . sodium chloride  3 mL Intravenous Q12H  . Warfarin - Pharmacist Dosing Inpatient   Does not apply Q24H   Assessment: Patient complains burning with urination. Cipro per pharmacy for UTI CrCl 26.2  ml/min  Goal of Therapy:  Eradicate infection  Plan:  Cipro 400 mg IV every 24 hours Monitor renal function Labs per protocol F/U po therapy when appropriate  Abner Greenspan, [place] Michiels Bennett 05/09/2014,6:19 PM

## 2014-05-09 NOTE — Progress Notes (Signed)
Consulting cardiologist:  Kate Sable MD Primary Cardiologist: Carlyle Dolly MD  Cardiology Specific Problem List: 1 Acute on Chronic Diastolic CHF  2. PAF 3. Hypertension  4. Demand Ischemia   Subjective:    Complaints of burning and pain with urination. Breathing better.   Objective:   Temp:  [97.7 F (36.5 C)-98.3 F (36.8 C)] 98.3 F (36.8 C) (03/03 0636) Pulse Rate:  [58-77] 76 (03/03 0636) Resp:  [16-23] 18 (03/03 0636) BP: (103-136)/(43-66) 127/47 mmHg (03/03 0636) SpO2:  [94 %-100 %] 97 % (03/03 0636) Weight:  [81.602 kg (179 lb 14.4 oz)-81.738 kg (180 lb 3.2 oz)] 81.602 kg (179 lb 14.4 oz) (03/03 0636) Last BM Date: 05/07/14  Filed Weights   05/08/14 1244 05/09/14 0636  Weight: 81.738 kg (180 lb 3.2 oz) 81.602 kg (179 lb 14.4 oz)    Intake/Output Summary (Last 24 hours) at 05/09/14 0838 Last data filed at 05/09/14 0056  Gross per 24 hour  Intake    486 ml  Output   1175 ml  Net   -689 ml    Telemetry: NSR, bradycardia   Exam:  General: No acute distress. Frail   HEENT: Conjunctiva and lids normal, oropharynx clear.  Lungs: Bibasilar crackles.  Cardiac: No elevated JVP or bruits. RRR, 40/9 systolic murmur, no gallop or rub.   Abdomen: Normoactive bowel sounds, nontender, nondistended.  Extremities: No pitting edema, distal pulses diminished with venous stasis skin discoloration bilaterally, worse on the left.   Neuropsychiatric: Alert and oriented x3, affect appropriate.   Lab Results:  Basic Metabolic Panel:  Recent Labs Lab 05/08/14 0733  NA 141  K 3.6  CL 102  CO2 29  GLUCOSE 92  BUN 23  CREATININE 1.98*  CALCIUM 8.8    Liver Function Tests: No results for input(s): AST, ALT, ALKPHOS, BILITOT, PROT, ALBUMIN in the last 168 hours.  CBC:  Recent Labs Lab 05/08/14 0733 05/09/14 0547  WBC 4.4 3.5*  HGB 8.7* 7.7*  HCT 28.6* 25.3*  MCV 92.0 93.7  PLT 167 165    Cardiac Enzymes:  Recent Labs Lab  05/08/14 0733 05/08/14 1541 05/09/14 0547  TROPONINI 0.06* 0.05* 0.04*    BNP:  Recent Labs  10/23/13 0806 11/29/13 0950 02/15/14 1040  PROBNP 1867.0* 2233.0* 2802.0*    Coagulation:  Recent Labs Lab 05/07/14 1336 05/08/14 0735 05/09/14 0547  INR 1.7 1.67* 2.04*    Radiology: Dg Chest Port 1 View  05/08/2014   CLINICAL DATA:  Short of breath per 2 day  EXAM: PORTABLE CHEST - 1 VIEW  COMPARISON:  02/15/2014  FINDINGS: Severe cardiomegaly. Low lung volumes. Diffuse pulmonary edema. No pneumothorax.  IMPRESSION: Moderate CHF with pulmonary edema.  Worsening since the prior study.   Electronically Signed   By: Marybelle Killings M.D.   On: 05/08/2014 08:24     Medications:   Scheduled Medications: . amiodarone  100 mg Oral Daily  . colchicine  0.3 mg Oral Daily  . ferrous sulfate  325 mg Oral TID WC  . fesoterodine  4 mg Oral Daily  . furosemide  60 mg Intravenous 3 times per day  . levothyroxine  50 mcg Oral QAC breakfast  . potassium chloride SA  40 mEq Oral Daily  . senna  1 tablet Oral BID  . simvastatin  10 mg Oral q1800  . sodium chloride  3 mL Intravenous Q12H  . sodium chloride  3 mL Intravenous Q12H  . Warfarin - Pharmacist Dosing Inpatient  Does not apply Q24H      PRN Medications: sodium chloride, acetaminophen **OR** acetaminophen, alum & mag hydroxide-simeth, HYDROcodone-acetaminophen, ondansetron **OR** ondansetron (ZOFRAN) IV, sodium chloride, traZODone   Assessment and Plan:   1. Acute on Chronic Diastolic CHF: Has diuresed 1.175 but only down 689 cc total. She is feeling better but remains dyspneic and frail. Continues on lasix 60 mg IV Q 8 hours. Creatinine 1.98 yesterday. Will need to repeat today. Dry wt 182 lbs per last note from Dr. Aundra Dubin in January  Current wt 179 lbs   2. PAF S/P MAZE: Continues on amiodarone and coumadin. INR 2.04.   3. Hypertension: Currently well controlled.   4. Anemia: Worsening Hgb to 7.7 from 8.7. Anemia profile?  Hemoccult stools.   5.Dysuria: Check U/A  Phill Myron. Lawrence NP [facility]  05/09/2014, 8:38 AM   The patient was seen and examined, and I agree with the assessment and plan as documented above, with modifications as noted below. Pt says breathing is somewhat better. Complains of "burning pain in urinary tract". Only fell asleep at 8 am. Hgb continues to drop. Only had dark tarry stools after starting oral iron replacement therapy. Iron panel pending.  RECS: I suspect she may have an iron malabsorption issue and would consider parenteral therapy. This will also serve to improve heart failure. With respect to decompensated diastolic heart failure, will need to continue IV Lasix 60 mg tid for the next few days. She will need strict dietary adherence to a low-sodium diet, which was discussed yesterday.

## 2014-05-09 NOTE — Progress Notes (Signed)
UR chart review completed.  

## 2014-05-09 NOTE — Progress Notes (Signed)
Patient has been active with [facility] services. Spoke with patient who indicates she would like to continue to be followed by [facility] post discharge. [facility] Care Management services will not interfere or replace services provided by home health. Made inpatient RNCM aware [facility] is following.  [facility] [phone]

## 2014-05-09 NOTE — Progress Notes (Signed)
[facility] PROGRESS NOTE  [name] [name] VVO:[id number] DOB: 1940/05/17 DOA: 05/08/2014 PCP: [name], MD  Assessment/Plan: Acute pulmonary edema: Related to acute on chronic systolic and diastolic CHF. Is on oxygen at 2 L at home. Volume status unknown -643ml.  Weight 81.6kg. Will continue IV Lasix.Marland Kitchen Appreciate Cardiology assistance.  Active Problems: Acute on chronic combined systolic and diastolic CHF (congestive heart failure); Echo systolic function was normal. Theestimated ejection fraction was in the range of 50% to 55%.Images were inadequate for LV wall motion assessment. Diastolicdysfunction, indeterminate grade. Troponin stable. No chest pain. Home medications include amiodarone, Roxanol and Demadex. Continue the amiodarone hold the Zaroxolyn and Demadex for now. Lasix as above. Evaluated by cards who concern for iron malabsorption  recommend considering parental therapy.   Elevated troponin: stable x3. No chest pain.  Chronic kidney disease: Stage IV. Chart review indicates base creatinine range 1.9-2.0. Current creatinine pending. Will monitor closely given IV Lasix that is planned. Monitor urine output. Complained painful urination. Has foley. Await U/a   Paroxysmal atrial fibrillation: Rate controlled. INR on admission 2.04. Will ask pharmacy to dose Coumadin   HTN (hypertension): remains controlled. .   Mitral regurgitation: Status post MA ZE procedure with valve replacement.    Anemia; chronic disease. in September of last year hemoglobin range 9.9-10.9. Similar last year range 8.4-8.6. Hemoglobin trending down today. Somewhat of a decline. await FOBT. Anemia panel results pending. Consider transfusion.   No signs symptoms of active bleeding. Monitor   Obesity; BMI 33 nutritional consult  COPD; are stable at baseline. She is on home oxygen at 2 L. Monitor   Code Status: full Family Communication: none present Disposition Plan: home when  ready   Consultants:  cardiology  Procedures:  Echo: Systolic function was normal. The estimated ejection fraction was in the range of 50% to 55%. Images were inadequate for LV wall motion assessment. Diastolic dysfunction, indeterminate grade.  Antibiotics:  none  HPI/Subjective: Reports feeling/breathing better. Legs still feeling 'tight"  Objective: Filed Vitals:   05/09/14 0636  BP: 127/47  Pulse: 76  Temp: 98.3 F (36.8 C)  Resp: 18    Intake/Output Summary (Last 24 hours) at 05/09/14 1148 Last data filed at 05/09/14 0056  Gross per 24 hour  Intake    486 ml  Output   1175 ml  Net   -689 ml   Filed Weights   05/08/14 1244 05/09/14 0636  Weight: 81.738 kg (180 lb 3.2 oz) 81.602 kg (179 lb 14.4 oz)    Exam:   General:  Obese somewhat pale, appears comfortable  Cardiovascular: RRR +murmur, 1-2+ LE pitting edema to above knees, venous stasis changes  Respiratory:   Abdomen: normal effort BS with fine crackles up to mid lobe bilaterally no wheeze  Musculoskeletal: no clubbing or cyanosis   Data Reviewed: Basic Metabolic Panel:  Recent Labs Lab 05/08/14 0733  NA 141  K 3.6  CL 102  CO2 29  GLUCOSE 92  BUN 23  CREATININE 1.98*  CALCIUM 8.8   Liver Function Tests: No results for input(s): AST, ALT, ALKPHOS, BILITOT, PROT, ALBUMIN in the last 168 hours. No results for input(s): LIPASE, AMYLASE in the last 168 hours. No results for input(s): AMMONIA in the last 168 hours. CBC:  Recent Labs Lab 05/08/14 0733 05/09/14 0547  WBC 4.4 3.5*  NEUTROABS 3.3  --   HGB 8.7* 7.7*  HCT 28.6* 25.3*  MCV 92.0 93.7  PLT 167 165   Cardiac Enzymes:  Recent  Labs Lab 05/08/14 0733 05/08/14 1541 05/09/14 0547  TROPONINI 0.06* 0.05* 0.04*   BNP (last 3 results) No results for input(s): BNP in the last 8760 hours.  ProBNP (last 3 results)  Recent Labs  10/23/13 0806 11/29/13 0950 02/15/14 1040  PROBNP 1867.0* 2233.0* 2802.0*     CBG: No results for input(s): GLUCAP in the last 168 hours.  No results found for this or any previous visit (from the past 240 hour(s)).   Studies: Dg Chest Port 1 View  05/08/2014   CLINICAL DATA:  Short of breath per 2 day  EXAM: PORTABLE CHEST - 1 VIEW  COMPARISON:  02/15/2014  FINDINGS: Severe cardiomegaly. Low lung volumes. Diffuse pulmonary edema. No pneumothorax.  IMPRESSION: Moderate CHF with pulmonary edema.  Worsening since the prior study.   Electronically Signed   By: Marybelle Killings M.D.   On: 05/08/2014 08:24    Scheduled Meds: . amiodarone  100 mg Oral Daily  . colchicine  0.3 mg Oral Daily  . ferrous sulfate  325 mg Oral TID WC  . fesoterodine  4 mg Oral Daily  . furosemide  60 mg Intravenous 3 times per day  . levothyroxine  50 mcg Oral QAC breakfast  . potassium chloride SA  40 mEq Oral Daily  . senna  1 tablet Oral BID  . simvastatin  10 mg Oral q1800  . sodium chloride  3 mL Intravenous Q12H  . sodium chloride  3 mL Intravenous Q12H  . warfarin  2.5 mg Oral Once  . Warfarin - Pharmacist Dosing Inpatient   Does not apply Q24H   Continuous Infusions:   Principal Problem:   Acute on chronic combined systolic and diastolic CHF (congestive heart failure) Active Problems:   HTN (hypertension)   Mitral regurgitation   Chronic kidney disease   Paroxysmal atrial fibrillation   Anemia in chronic kidney disease   Obesity   Elevated troponin   Hypothyroidism    Time spent: 35 minutes    [facility] Pager [phone]. If 7PM-7AM, please contact night-coverage at [email or web address], password [facility] 05/09/2014, 11:48 AM  LOS: 1 day

## 2014-05-09 NOTE — Care Management Note (Addendum)
    Page 1 of 2   05/13/2014     11:24:34 AM CARE MANAGEMENT NOTE 05/13/2014  Patient:  [name] [name], [name] [name]   Account Number:  [id number]  Date Initiated:  05/09/2014  Documentation initiated by:  Theophilus Kinds  Subjective/Objective Assessment:   Pt admitted from home with CHF. Pt lives alone and has a daughter and granddaughter who is very active in the care of the pt. Pt has a rollator, rolling walker, home O2 with [employer], lift chair, and hospital bed. Pt active with [facility] RN and PT.     Action/Plan:   Will arrange resumption of [facility] at discharge per pts request.   Anticipated DC Date:  05/13/2014   Anticipated DC Plan:  [place]  CM consult      [facility] Choice  Resumption Of Svcs/PTA Provider   Choice offered to / List presented to:  C-1 Patient        [place] arranged  HH-1 RN  [place] PT      [place].   Status of service:  Completed, signed off Medicare Important Message given?  YES (If response is "NO", the following Medicare IM given date fields will be blank) Date Medicare IM given:  05/10/2014 Medicare IM given by:  Christinia Gully C Date Additional Medicare IM given:  05/13/2014 Additional Medicare IM given by:  Theophilus Kinds  Discharge Disposition:  [place]  Per UR Regulation:    If discussed at Long Length of Stay Meetings, dates discussed:    Comments:  05/13/14 [place], RN BSN CM Pt discharged home today with resumption of [facility] RN and PT (per pt choice). Romualdo Bolk of [facility] is aware and will collect the pts information from the chart. [place] services to resume within 48 hours of discharge. No DME needs noted. Pt and pts nurse aware of discharge arrangements.  05/10/14 [place], RN BSN CM CM made aware that pt is active with [facility] as well as [facility]. IF d/c over the weekend, staff to call and fax [place] orders to [facility]. No DME needs noted. Pt  and pts nurse aware of discharge arrangements.  05/09/14 [place], RN BSN CM

## 2014-05-09 NOTE — Progress Notes (Signed)
ANTICOAGULATION CONSULT NOTE - follow up Pharmacy Consult for Coumadin Indication: atrial fibrillation  Allergies  Allergen Reactions  . Indomethacin Other (See Comments)    dizziness  . Norvasc [Amlodipine Besylate] Cough   Patient Measurements: Height: 5\' 2"  (157.5 cm) Weight: 179 lb 14.4 oz (81.602 kg) IBW/kg (Calculated) : 50.1  Vital Signs: Temp: 98.3 F (36.8 C) (03/03 0636) Temp Source: Oral (03/03 0636) BP: 127/47 mmHg (03/03 0636) Pulse Rate: 76 (03/03 0636)  Labs:  Recent Labs  05/07/14 1336 05/08/14 0733 05/08/14 0735 05/08/14 1541 05/09/14 0547  HGB  --  8.7*  --   --  7.7*  HCT  --  28.6*  --   --  25.3*  PLT  --  167  --   --  165  LABPROT  --   --  19.9*  --  23.2*  INR 1.7  --  1.67*  --  2.04*  CREATININE  --  1.98*  --   --   --   TROPONINI  --  0.06*  --  0.05* 0.04*   Estimated Creatinine Clearance: 25 mL/min (by C-G formula based on Cr of 1.98).  Medical History: Past Medical History  Diagnosis Date  . COPD (chronic obstructive pulmonary disease)   . History of TIA (transient ischemic attack)     Diagnosed 2003  . History of stroke     2000 [place]  . Obesity   . Juvenile rheumatic fever     Age 35  . Essential hypertension, benign   . Hyperlipidemia   . Mitral regurgitation     Status post bioprosthetic MVR (05/2013)  . CKD (chronic kidney disease) stage 4, GFR 15-29 ml/min   . Paroxysmal atrial fibrillation     s/p MAZE (05/2013)  . Chronic diastolic congestive heart failure     a) ECHO (09/2013) EF 50-55%  . Arthritis   . Aortic insufficiency due to bicuspid aortic valve     Moderate by TEE  . 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 S/P Maze operation for atrial fibrillation 05/16/2013    Complete bilateral atrial lesion set using cryothermy and bipolar radiofrequency ablation  with oversewing of LA appendage  . NICM (nonischemic cardiomyopathy)     a) LHC (04/2013) no significant CAD   Medications:  Prescriptions prior to admission  Medication Sig Dispense Refill Last Dose  . amiodarone (PACERONE) 100 MG tablet Take 1 tablet (100 mg total) by mouth daily. 30 tablet 3 05/07/2014 at Unknown time  . colchicine 0.6 MG tablet Take 0.5 tablets (0.3 mg total) by mouth daily. 30 tablet 0 05/07/2014 at Unknown time  . ferrous sulfate 325 (65 FE) MG EC tablet Take 325 mg by mouth 3 (three) times daily with meals.   05/07/2014 at Unknown time  . levothyroxine (SYNTHROID, LEVOTHROID) 50 MCG tablet Take 50 mcg by mouth daily before breakfast.   05/07/2014 at Unknown time  . metolazone (ZAROXOLYN) 2.5 MG tablet Take 1 tablet (2.5 mg total) by mouth as directed. (Patient taking differently: Take 2.5 mg by mouth daily. ) 5 tablet 3 05/07/2014 at Unknown time  . potassium chloride SA (K-DUR,KLOR-CON) 20 MEQ tablet Take 1 tablet (20 mEq total) by mouth daily. 60 tablet 6 05/07/2014 at Unknown time  . simvastatin (ZOCOR) 10 MG tablet TAKE ONE TABLET BY MOUTH EVERY EVENING. 30 tablet 3 05/07/2014  at Unknown time  . tolterodine (DETROL LA) 4 MG 24 hr capsule Take 4 mg by mouth daily.   05/07/2014 at Unknown time  . torsemide (DEMADEX) 100 MG tablet Take 1 tablet (100 mg total) by mouth every morning. 90 tablet 3 05/07/2014 at Unknown time  . warfarin (COUMADIN) 2.5 MG tablet Take 2.5 mg by mouth one time only at 6 PM.    05/07/2014 at Unknown time   Assessment: 74yo female on chronic Coumadin for h/o afib.  Reviewed notes from anticoagulation clinic visit on 3/1 and it appears INR was 1.7 at that time and Coumadin dose was increased to 2.5mg  PO daily.  INR is still below target of 2-3 on admission (3/2).  INR is therapeutic today after dose increase yesterday.  Goal of Therapy:  INR 2-3 Monitor platelets by anticoagulation protocol: Yes   Plan:  Coumadin 2.5mg  po today (home dose) INR daily until  stable  Adham Johnson A 05/09/2014,11:10 AM

## 2014-05-09 NOTE — Plan of Care (Signed)
Problem: Food- and Nutrition-Related Knowledge Deficit (NB-1.1) Goal: Nutrition education Formal process to instruct or train a patient/client in a skill or to impart knowledge to help patients/clients voluntarily manage or modify food choices and eating behavior to maintain or improve health. Outcome: Completed/Met Date Met:  05/09/14 Nutrition Education Note  RD Re-consulted for nutrition education regarding a Heart Healthy diet.    RD provided "Heart Healthy Nutrition Therapy" handout from the [employer] of [employer]. Reviewed patient's dietary recall. Recall indicates pt does not have much variety with intake. She eats two meals. Breakfast is either instant oatmeal or an egg and lunch is a tomato sandwich. She snacks on fruit in between.  Pts biggest sources of fat in diet come from her mayonnaise which she puts on everything. Went over lower fat options like a vinaigrette. Pt hardly ever eats meat, but mentioned she should stick to turkey/chicken. She does occasionally drink whole milk, encouraged to try to wean to lower fat milk. She fries her eggs in margarine, emphasized that she choose low sodium margarines. Per discussion yesterday, pt does not cook or eat vegetables. Family member present said siblings would try to [place] some healthier options for pt.  Discouraged intake of processed foods and use of salt shaker. Encouraged fresh fruits and vegetables as well as whole grain sources of carbohydrates to maximize fiber intake. Teach back method used.  Expect fair-good compliance.  Body mass index is 32.9 kg/(m^2). Pt meets criteria for obese based on current BMI.  Current diet order is Heart Healthy, patient is consuming approximately 75% of meals at this time. Labs and medications reviewed. No further nutrition interventions warranted at this time. RD contact information provided. If additional nutrition issues arise, please re-consult RD.  Burtis Junes RD,  LDN Nutrition Pager: [phone] 05/09/2014 1:54 PM

## 2014-05-10 LAB — CBC
HCT: 29.1 % — ABNORMAL LOW (ref 36.0–46.0)
HEMOGLOBIN: 8.9 g/dL — AB (ref 12.0–15.0)
MCH: 28.7 pg (ref 26.0–34.0)
MCHC: 30.6 g/dL (ref 30.0–36.0)
MCV: 93.9 fL (ref 78.0–100.0)
Platelets: 153 10*3/uL (ref 150–400)
RBC: 3.1 MIL/uL — AB (ref 3.87–5.11)
RDW: 18.6 % — ABNORMAL HIGH (ref 11.5–15.5)
WBC: 4 10*3/uL (ref 4.0–10.5)

## 2014-05-10 LAB — BASIC METABOLIC PANEL
Anion gap: 7 (ref 5–15)
BUN: 21 mg/dL (ref 6–23)
CALCIUM: 8.6 mg/dL (ref 8.4–10.5)
CO2: 32 mmol/L (ref 19–32)
CREATININE: 1.83 mg/dL — AB (ref 0.50–1.10)
Chloride: 101 mmol/L (ref 96–112)
GFR calc Af Amer: 30 mL/min — ABNORMAL LOW (ref >90)
GFR, EST NON AFRICAN AMERICAN: 26 mL/min — AB (ref >90)
GLUCOSE: 103 mg/dL — AB (ref 70–99)
Potassium: 3.9 mmol/L (ref 3.5–5.1)
Sodium: 140 mmol/L (ref 135–145)

## 2014-05-10 LAB — PROTIME-INR
INR: 2.56 — ABNORMAL HIGH (ref 0.00–1.49)
Prothrombin Time: 27.7 seconds — ABNORMAL HIGH (ref 11.6–15.2)

## 2014-05-10 MED ORDER — METOLAZONE 5 MG PO TABS
2.5000 mg | ORAL_TABLET | Freq: Once | ORAL | Status: AC
  Administered 2014-05-10: 2.5 mg via ORAL
  Filled 2014-05-10: qty 1

## 2014-05-10 MED ORDER — WARFARIN SODIUM 2.5 MG PO TABS
1.2500 mg | ORAL_TABLET | Freq: Once | ORAL | Status: AC
  Administered 2014-05-10: 1.25 mg via ORAL
  Filled 2014-05-10: qty 1

## 2014-05-10 NOTE — Progress Notes (Signed)
Consulting cardiologist: Kate Sable MD Primary Cardiologist: Carlyle Dolly MD  Cardiology Specific Problem List:  1. Acute on Chronic Diastolic CHF 2. PAF 3. Hypertension 4. Demand ischemia   Subjective:   Breathing better. Still having some dysuria but improving.    Objective:   Temp:  [97.7 F (36.5 C)-98.5 F (36.9 C)] 97.7 F (36.5 C) (03/04 0538) Pulse Rate:  [64-79] 64 (03/04 0538) Resp:  [18-20] 20 (03/04 0538) BP: (111-129)/(43-56) 123/50 mmHg (03/04 0538) SpO2:  [94 %-98 %] 94 % (03/04 0538) Weight:  [179 lb 9.6 oz (81.466 kg)] 179 lb 9.6 oz (81.466 kg) (03/04 0538) Last BM Date: 05/07/14  Filed Weights   05/08/14 1244 05/09/14 0636 05/10/14 0538  Weight: 180 lb 3.2 oz (81.738 kg) 179 lb 14.4 oz (81.602 kg) 179 lb 9.6 oz (81.466 kg)    Intake/Output Summary (Last 24 hours) at 05/10/14 0937 Last data filed at 05/10/14 0542  Gross per 24 hour  Intake    815 ml  Output   1500 ml  Net   -685 ml    Telemetry:NSR in the 60's.   Exam:  General: No acute distress.  HEENT: Conjunctiva and lids normal, oropharynx clear.  Lungs: Clear to auscultation, nonlabored.  Cardiac: No elevated JVP or bruits. RRR, 1/6 systolic murmur, no gallop or rub.   Abdomen: Normoactive bowel sounds, nontender, nondistended.  Extremities: No pitting edema, distal pulses full. Woody edema with venous statis skin changes.   Neuropsychiatric: Alert and oriented x3, affect appropriate.   Lab Results:  Basic Metabolic Panel:  Recent Labs Lab 05/08/14 0733 05/09/14 1135 05/10/14 0651  NA 141 139 140  K 3.6 3.8 3.9  CL 102 102 101  CO2 29 31 32  GLUCOSE 92 125* 103*  BUN 23 19 21   CREATININE 1.98* 1.89* 1.83*  CALCIUM 8.8 8.3* 8.6     CBC:  Recent Labs Lab 05/09/14 0547 05/09/14 1453 05/10/14 0651  WBC 3.5* 3.2* 4.0  HGB 7.7* 7.7* 8.9*  HCT 25.3* 25.6* 29.1*  MCV 93.7 93.8 93.9  PLT 165 164 153    Cardiac Enzymes:  Recent Labs Lab  05/08/14 0733 05/08/14 1541 05/09/14 0547  TROPONINI 0.06* 0.05* 0.04*    BNP:  Recent Labs  10/23/13 0806 11/29/13 0950 02/15/14 1040  PROBNP 1867.0* 2233.0* 2802.0*    Coagulation:  Recent Labs Lab 05/08/14 0735 05/09/14 0547 05/10/14 0651  INR 1.67* 2.04* 2.56*   Echocardiogram 05/08/2014  Left ventricle: The cavity size was normal. There was mild concentric hypertrophy. Systolic function was normal. The estimated ejection fraction was in the range of 50% to 55%. Images were inadequate for LV wall motion assessment. Diastolic dysfunction, indeterminate grade. - Aortic valve: Mildly calcified annulus. Trileaflet; mildly thickened, mildly calcified leaflets. Mild aortic stenosis. Peak velocity 2.28 m/s. Mean gradient 12 mmHg. There was moderate regurgitation. - Mitral valve: Normally functioning bioprosthetic mitral valve. There was no significant regurgitation. - Left atrium: The atrium was moderately dilated. Volume/bsa, S: 38.2 ml/m^2. - Right ventricle: Systolic function was mildly reduced. - Right atrium: The atrium was mildly dilated. - Tricuspid valve: There was mild-moderate regurgitation. - Pulmonary arteries: PA peak pressure: 38 mm Hg (S). Mildly elevated pulmonary pressures.   Medications:   Scheduled Medications: . amiodarone  100 mg Oral Daily  . ciprofloxacin  400 mg Intravenous Q24H  . colchicine  0.3 mg Oral Daily  . ferrous sulfate  325 mg Oral TID WC  . fesoterodine  4 mg Oral Daily  .  furosemide  60 mg Intravenous 3 times per day  . levothyroxine  50 mcg Oral QAC breakfast  . pantoprazole  40 mg Oral Daily  . polyethylene glycol  17 g Oral Daily  . potassium chloride SA  40 mEq Oral Daily  . senna  1 tablet Oral BID  . simvastatin  10 mg Oral q1800  . sodium chloride  3 mL Intravenous Q12H  . sodium chloride  3 mL Intravenous Q12H  . Warfarin - Pharmacist Dosing Inpatient   Does not apply Q24H      PRN  Medications: sodium chloride, acetaminophen **OR** acetaminophen, alum & mag hydroxide-simeth, HYDROcodone-acetaminophen, ondansetron **OR** ondansetron (ZOFRAN) IV, sodium chloride, traZODone   Assessment and Plan:   1.Acute on Chronic Diastolic CHF:  She has diuresed 1.13 liters since admission. She is feeling better, edema is improved. On IV lasix 60 mg IV Q 8 hours. Some sacral edema is noted. Will give one dose of metolazone 2.5 mg po to assist with better diureses. Wt down 1 lbs. Creatinine 1.83. Will transition to po torsemide over the weekend. Continue potassium replacement. Repeat BMET in am.   2. PAF: S/P Maze Heart rate is well controlled. Continues on amiodarone and coumadin. INR 2.56. Careful monitoring by pharmacy as she is now on cipro for UTI.   3. Hypertension: Well controlled currently.   4.Anemia: Received on units of PRBC;s yesterday. Hgb improved from 7.7 to 8.9.   Phill Myron. Lawrence NP AACC     The patient was seen and examined, and I agree with the assessment and plan as documented above, with modifications as noted below. Pt feeling much better today and sitting up in chair. Now on ciprofloxacin for UTI. Received 1 unit PRBC with increase in Hgb.  Recommendations: She is gradually improving with respect to decompensated diastolic heart failure, and will need to continue IV Lasix 60 mg tid for the next 1-2 days. Agree with one low-dose of metolazone, 2.5 mg. She will need strict dietary adherence to a low-sodium diet, which was previously discussed.  05/10/2014, 9:37 AM

## 2014-05-10 NOTE — Progress Notes (Signed)
[name] [name] for Coumadin Indication: atrial fibrillation  Allergies  Allergen Reactions  . Indomethacin Other (See Comments)    dizziness  . Norvasc [Amlodipine Besylate] Cough   Patient Measurements: Height: 5\' 2"  (157.5 cm) Weight: 179 lb 9.6 oz (81.466 kg) IBW/kg (Calculated) : 50.1  Vital Signs: Temp: 97.7 F (36.5 C) (03/04 0538) Temp Source: Oral (03/04 0538) BP: 123/50 mmHg (03/04 0538) Pulse Rate: 64 (03/04 0538)  Labs:  Recent Labs  05/08/14 0733 05/08/14 0735 05/08/14 1541 05/09/14 0547 05/09/14 1135 05/09/14 1453 05/10/14 0651  HGB 8.7*  --   --  7.7*  --  7.7* 8.9*  HCT 28.6*  --   --  25.3*  --  25.6* 29.1*  PLT 167  --   --  165  --  164 153  LABPROT  --  19.9*  --  23.2*  --   --  27.7*  INR  --  1.67*  --  2.04*  --   --  2.56*  CREATININE 1.98*  --   --   --  1.89*  --  1.83*  TROPONINI 0.06*  --  0.05* 0.04*  --   --   --    Estimated Creatinine Clearance: 27.1 mL/min (by C-G formula based on Cr of 1.83).  Medical History: Past Medical History  Diagnosis Date  . COPD (chronic obstructive pulmonary disease)   . History of TIA (transient ischemic attack)     Diagnosed 2003  . History of stroke     2000 [place]  . Obesity   . Juvenile rheumatic fever     Age 74  . Essential hypertension, benign   . Hyperlipidemia   . Mitral regurgitation     Status post bioprosthetic MVR (05/2013)  . CKD (chronic kidney disease) stage 4, GFR 15-29 ml/min   . Paroxysmal atrial fibrillation     s/p MAZE (05/2013)  . Chronic diastolic congestive heart failure     a) ECHO (09/2013) EF 50-55%  . Arthritis   . Aortic insufficiency due to bicuspid aortic valve     Moderate by TEE  . 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 S/P Maze operation for atrial fibrillation 05/16/2013     Complete bilateral atrial lesion set using cryothermy and bipolar radiofrequency ablation with oversewing of LA appendage  . NICM (nonischemic cardiomyopathy)     a) LHC (04/2013) no significant CAD   Medications:  Prescriptions prior to admission  Medication Sig Dispense Refill Last Dose  . amiodarone (PACERONE) 100 MG tablet Take 1 tablet (100 mg total) by mouth daily. 30 tablet 3 05/07/2014 at Unknown time  . colchicine 0.6 MG tablet Take 0.5 tablets (0.3 mg total) by mouth daily. 30 tablet 0 05/07/2014 at Unknown time  . ferrous sulfate 325 (65 FE) MG EC tablet Take 325 mg by mouth 3 (three) times daily with meals.   05/07/2014 at Unknown time  . levothyroxine (SYNTHROID, LEVOTHROID) 50 MCG tablet Take 50 mcg by mouth daily before breakfast.   05/07/2014 at Unknown time  . metolazone (ZAROXOLYN) 2.5 MG tablet Take 1 tablet (2.5 mg total) by mouth as directed. (Patient taking differently: Take 2.5 mg by mouth daily. ) 5 tablet 3 05/07/2014 at Unknown time  . potassium chloride SA (K-DUR,KLOR-CON) 20 MEQ tablet Take 1 tablet (20 mEq total) by mouth  daily. 60 tablet 6 05/07/2014 at Unknown time  . simvastatin (ZOCOR) 10 MG tablet TAKE ONE TABLET BY MOUTH EVERY EVENING. 30 tablet 3 05/07/2014 at Unknown time  . tolterodine (DETROL LA) 4 MG 24 hr capsule Take 4 mg by mouth daily.   05/07/2014 at Unknown time  . torsemide (DEMADEX) 100 MG tablet Take 1 tablet (100 mg total) by mouth every morning. 90 tablet 3 05/07/2014 at Unknown time  . warfarin (COUMADIN) 2.5 MG tablet Take 1.25-2.5 mg by mouth daily at 6 PM. Take 1 tab (2.5mg ) on Tues, Thurs, Sat, Sun and take 1/2 tab (1.25mg ) on Mon, Wed, Fri   05/07/2014 at Unknown time   Assessment: 74yo female on chronic Coumadin for h/o afib.  INR was sub-therapeutic on admission.  She was seen in Coumadin clinic on 3/1 with INR = 1.7 and home dose was increased to 13.75mg /week (regimen listed above).   INR is therapeutic today.  No bleeding noted.   Goal of Therapy:   INR 2-3   Plan:  Coumadin 1.25mg  po today (home dose) INR daily   [name] [name], [name] [name] Drafts 05/10/2014,2:10 PM

## 2014-05-10 NOTE — Progress Notes (Signed)
Gave Advance Directives information to patient and we discussed them. She wasn't ready to complete the forms. Gave her contact information if she needed to complete.

## 2014-05-10 NOTE — Progress Notes (Signed)
[facility] PROGRESS NOTE  CHISTINE DEMATTEO DGL:[id number] DOB: May 25, 1940 DOA: 05/08/2014 PCP: [name], MD    Code Status: Partial Family Communication: Discussed with daughter on 3/3. Disposition Plan: Discharge to home when clinically improved, likely in 72 hours.   Consultants:  Cardiology  Procedures:  2-D echocardiogram:  Study Conclusions - Left ventricle: The cavity size was normal. There was mild concentric hypertrophy. Systolic function was normal. The estimated ejection fraction was in the range of 50% to 55%. Images were inadequate for LV wall motion assessment. Diastolic dysfunction, indeterminate grade. - Aortic valve: Mildly calcified annulus. Trileaflet; mildly thickened, mildly calcified leaflets. Mild aortic stenosis. Peak velocity 2.28 m/s. Mean gradient 12 mmHg. There was moderate regurgitation. - Mitral valve: Normally functioning bioprosthetic mitral valve. There was no significant regurgitation. - Left atrium: The atrium was moderately dilated. Volume/bsa, S: 38.2 ml/m^2. - Right ventricle: Systolic function was mildly reduced. - Right atrium: The atrium was mildly dilated. - Tricuspid valve: There was mild-moderate regurgitation. - Pulmonary arteries: PA peak pressure: 38 mm Hg (S). Mildly elevated pulmonary pressures.  Antibiotics:  Cipro 3/3>>  HPI/Subjective: Overall, patient says that she is breathing better. She denies chest pain. She has less burning with urination today.  Objective: Filed Vitals:   05/10/14 0538  BP: 123/50  Pulse: 64  Temp: 97.7 F (36.5 C)  Resp: 20    Intake/Output Summary (Last 24 hours) at 05/10/14 1348 Last data filed at 05/10/14 0921  Gross per 24 hour  Intake    815 ml  Output    400 ml  Net    415 ml   Filed Weights   05/08/14 1244 05/09/14 0636 05/10/14 0538  Weight: 81.738 kg (180 lb 3.2 oz) 81.602 kg (179 lb 14.4 oz) 81.466 kg (179 lb 9.6 oz)     Exam:   General:  Alert 74 year old woman in no acute distress.  Cardiovascular: S1, S2, with a soft systolic murmur.  Respiratory: Occasional crackles, decreasing compared to yesterday.  Abdomen: Obese, positive bowel sounds, soft, nontender, nondistended.  Musculoskeletal: Decrease in pedal/lower extremity edema down to trace to 1+ with chronic venous stasis dermatitis changes.  Neurologic: She is alert and oriented 3. Her speech is clear. Cranial nerves are grossly intact.   Data Reviewed: Basic Metabolic Panel:  Recent Labs Lab 05/08/14 0733 05/09/14 1135 05/10/14 0651  NA 141 139 140  K 3.6 3.8 3.9  CL 102 102 101  CO2 29 31 32  GLUCOSE 92 125* 103*  BUN 23 19 21   CREATININE 1.98* 1.89* 1.83*  CALCIUM 8.8 8.3* 8.6   Liver Function Tests: No results for input(s): AST, ALT, ALKPHOS, BILITOT, PROT, ALBUMIN in the last 168 hours. No results for input(s): LIPASE, AMYLASE in the last 168 hours. No results for input(s): AMMONIA in the last 168 hours. CBC:  Recent Labs Lab 05/08/14 0733 05/09/14 0547 05/09/14 1453 05/10/14 0651  WBC 4.4 3.5* 3.2* 4.0  NEUTROABS 3.3  --   --   --   HGB 8.7* 7.7* 7.7* 8.9*  HCT 28.6* 25.3* 25.6* 29.1*  MCV 92.0 93.7 93.8 93.9  PLT 167 165 164 153   Cardiac Enzymes:  Recent Labs Lab 05/08/14 0733 05/08/14 1541 05/09/14 0547  TROPONINI 0.06* 0.05* 0.04*   BNP (last 3 results) No results for input(s): BNP in the last 8760 hours.  ProBNP (last 3 results)  Recent Labs  10/23/13 0806 11/29/13 0950 02/15/14 1040  PROBNP 1867.0* 2233.0* 2802.0*    CBG: No results  for input(s): GLUCAP in the last 168 hours.  No results found for this or any previous visit (from the past 240 hour(s)).   Studies: No results found.  Scheduled Meds: . amiodarone  100 mg Oral Daily  . ciprofloxacin  400 mg Intravenous Q24H  . colchicine  0.3 mg Oral Daily  . ferrous sulfate  325 mg Oral TID WC  . fesoterodine  4 mg Oral Daily   . furosemide  60 mg Intravenous 3 times per day  . levothyroxine  50 mcg Oral QAC breakfast  . pantoprazole  40 mg Oral Daily  . polyethylene glycol  17 g Oral Daily  . potassium chloride SA  40 mEq Oral Daily  . senna  1 tablet Oral BID  . simvastatin  10 mg Oral q1800  . sodium chloride  3 mL Intravenous Q12H  . sodium chloride  3 mL Intravenous Q12H  . Warfarin - Pharmacist Dosing Inpatient   Does not apply Q24H   Continuous Infusions:   Assessment and plan:  Principal Problem:   Acute on chronic diastolic heart failure Active Problems:   Anemia in chronic kidney disease   Anemia, iron deficiency   UTI (urinary tract infection)   HTN (hypertension)   Mitral regurgitation   Chronic kidney disease   Paroxysmal atrial fibrillation   Obesity   Elevated troponin   Hypothyroidism   1. Acute on chronic diastolic heart failure. Echo revealed an ejection fraction of 50-55%; indeterminate grade diastolic dysfunction; mild pulmonary hypertension; mild to moderate TR. She is clinically improving and has diuresed more than 1 L. She continues on IV Lasix titrated up to 60 mg 3 times a day. Metolazone added for more brisk diuresis. Home dose of Demadex is currently on hold.  Mitral regurgitation, status post previous MAZE procedure with bioprosthetic valve. Currently stable per echo.  Paroxysmal atrial fibrillation. Chronically anticoagulated with Coumadin. Her rate is controlled on amiodarone. TSH was within normal limits.  Elevated troponin I. Minimal elevation, likely secondary to demand ischemia with acute heart failure. Patient has had no chest pain.  Anemia, secondary to chronic disease and iron deficiency. Hemoglobin was 8.7 on admission, but decrease to 7.7 with no obvious evidence of GI or GU bleeding. Status post 1 unit of packed red blood cell transfusion on 3/3 with improvement in hemoglobin. Protonix started empirically. Ferrous sulfate continued. Patient has  never had a colonoscopy and has refused in the past. I encouraged her to reconsider. Would recommend outpatient GI follow-up. This was discussed with her daughter on 3/2.  Hypertension. Currently controlled.  Stage III to stage IV chronic kidney disease. Patient's creatinine was 1.98 on admission. It is actually improved with treatment of congestive heart failure.  Chronic respiratory failure secondary to oxygen dependent COPD. Currently stable.  UTI. Patient started on Cipro. Urine culture is pending.   Time spent: 30 minutes    [facility] Pager [phone] If 7PM-7AM, please contact night-coverage at [email or web address], password [facility] 05/10/2014, 1:48 PM  LOS: 2 days

## 2014-05-11 DIAGNOSIS — N390 Urinary tract infection, site not specified: Secondary | ICD-10-CM

## 2014-05-11 LAB — BASIC METABOLIC PANEL
Anion gap: 7 (ref 5–15)
BUN: 21 mg/dL (ref 6–23)
CALCIUM: 8.8 mg/dL (ref 8.4–10.5)
CHLORIDE: 96 mmol/L (ref 96–112)
CO2: 36 mmol/L — AB (ref 19–32)
Creatinine, Ser: 1.79 mg/dL — ABNORMAL HIGH (ref 0.50–1.10)
GFR calc Af Amer: 31 mL/min — ABNORMAL LOW (ref >90)
GFR calc non Af Amer: 27 mL/min — ABNORMAL LOW (ref >90)
GLUCOSE: 88 mg/dL (ref 70–99)
POTASSIUM: 3.6 mmol/L (ref 3.5–5.1)
Sodium: 139 mmol/L (ref 135–145)

## 2014-05-11 LAB — PROTIME-INR
INR: 2.58 — ABNORMAL HIGH (ref 0.00–1.49)
PROTHROMBIN TIME: 27.9 s — AB (ref 11.6–15.2)

## 2014-05-11 LAB — CBC
HEMATOCRIT: 29.9 % — AB (ref 36.0–46.0)
HEMOGLOBIN: 9.1 g/dL — AB (ref 12.0–15.0)
MCH: 28.5 pg (ref 26.0–34.0)
MCHC: 30.4 g/dL (ref 30.0–36.0)
MCV: 93.7 fL (ref 78.0–100.0)
Platelets: 154 10*3/uL (ref 150–400)
RBC: 3.19 MIL/uL — AB (ref 3.87–5.11)
RDW: 18.5 % — AB (ref 11.5–15.5)
WBC: 3.9 10*3/uL — AB (ref 4.0–10.5)

## 2014-05-11 LAB — OCCULT BLOOD X 1 CARD TO LAB, STOOL: FECAL OCCULT BLD: NEGATIVE

## 2014-05-11 MED ORDER — CIPROFLOXACIN HCL 250 MG PO TABS
500.0000 mg | ORAL_TABLET | Freq: Every evening | ORAL | Status: DC
  Administered 2014-05-11 – 2014-05-12 (×2): 500 mg via ORAL
  Filled 2014-05-11 (×2): qty 2

## 2014-05-11 MED ORDER — WARFARIN SODIUM 2 MG PO TABS
2.0000 mg | ORAL_TABLET | Freq: Once | ORAL | Status: AC
  Administered 2014-05-11: 2 mg via ORAL
  Filled 2014-05-11: qty 1

## 2014-05-11 NOTE — Progress Notes (Signed)
ANTIBIOTIC CONSULT NOTE  Pharmacy Consult for Cipro Indication: UTI  Allergies  Allergen Reactions  . Indomethacin Other (See Comments)    dizziness  . Norvasc [Amlodipine Besylate] Cough    Patient Measurements: Height: 5\' 2"  (157.5 cm) Weight: 175 lb 11.2 oz (79.697 kg) IBW/kg (Calculated) : 50.1 Adjusted Body Weight:   Vital Signs: Temp: 97.8 F (36.6 C) (03/05 0612) Temp Source: Oral (03/05 0612) BP: 121/49 mmHg (03/05 0612) Pulse Rate: 63 (03/05 0612) Intake/Output from previous day: 03/04 0701 - 03/05 0700 In: 483 [P.O.:480; I.V.:3] Out: 2850 [Urine:2850] Intake/Output from this shift:    Labs:  Recent Labs  05/09/14 1135 05/09/14 1453 05/10/14 0651 05/11/14 0619  WBC  --  3.2* 4.0 3.9*  HGB  --  7.7* 8.9* 9.1*  PLT  --  164 153 154  CREATININE 1.89*  --  1.83* 1.79*   Estimated Creatinine Clearance: 27.4 mL/min (by C-G formula based on Cr of 1.79). No results for input(s): VANCOTROUGH, VANCOPEAK, VANCORANDOM, GENTTROUGH, GENTPEAK, GENTRANDOM, TOBRATROUGH, TOBRAPEAK, TOBRARND, AMIKACINPEAK, AMIKACINTROU, AMIKACIN in the last 72 hours.   Microbiology: No results found for this or any previous visit (from the past 720 hour(s)).  Medical History: Past Medical History  Diagnosis Date  . COPD (chronic obstructive pulmonary disease)   . History of TIA (transient ischemic attack)     Diagnosed 2003  . History of stroke     2000 [place]  . Obesity   . Juvenile rheumatic fever     Age 89  . Essential hypertension, benign   . Hyperlipidemia   . Mitral regurgitation     Status post bioprosthetic MVR (05/2013)  . CKD (chronic kidney disease) stage 4, GFR 15-29 ml/min   . Paroxysmal atrial fibrillation     s/p MAZE (05/2013)  . Chronic diastolic congestive heart failure     a) ECHO (09/2013) EF 50-55%  . Arthritis   . Aortic insufficiency due to bicuspid aortic valve     Moderate by TEE  . S/P minimally invasive mitral valve replacement with bioprosthetic  valve and maze procedure 05/16/2013    27 mm Edwards magna mitral bovine bioprosthetic tissue valve placed via right thoracotomy  . S/P Maze operation for atrial fibrillation 05/16/2013    Complete bilateral atrial lesion set using cryothermy and bipolar radiofrequency ablation with oversewing of LA appendage  . NICM (nonischemic cardiomyopathy)     a) LHC (04/2013) no significant CAD    Medications:  Scheduled:  . amiodarone  100 mg Oral Daily  . ciprofloxacin  400 mg Intravenous Q24H  . colchicine  0.3 mg Oral Daily  . ferrous sulfate  325 mg Oral TID WC  . fesoterodine  4 mg Oral Daily  . furosemide  60 mg Intravenous 3 times per day  . levothyroxine  50 mcg Oral QAC breakfast  . pantoprazole  40 mg Oral Daily  . polyethylene glycol  17 g Oral Daily  . potassium chloride SA  40 mEq Oral Daily  . senna  1 tablet Oral BID  . simvastatin  10 mg Oral q1800  . sodium chloride  3 mL Intravenous Q12H  . sodium chloride  3 mL Intravenous Q12H  . warfarin  2 mg Oral Once  . Warfarin - Pharmacist Dosing Inpatient   Does not apply Q24H   Assessment: Cipro per pharmacy for UTI.  Patient is clinically improved and renal function stable.  Estimated CrCl ~ 25-40ml/min. Urine cx pending.  Patient meets criteria for oral antibiotics.  Cipro 3/3>>  Goal of Therapy:  Eradicate infection  Plan:  Cipro 500 mg POevery 24 hours Monitor renal function & cx data Duration of therapy per MD- recommend 3-5 days  Biagio Borg 05/11/2014,9:48 AM

## 2014-05-11 NOTE — Progress Notes (Signed)
[facility] PROGRESS NOTE  CREE NAPOLI FUX:[id number] DOB: November 14, 1940 DOA: 05/08/2014 PCP: [name], MD    Code Status: Partial Family Communication: Discussed with sister. Disposition Plan: Discharge to home when clinically improved, likely in 2-3 days.   Consultants:  Cardiology  Procedures:  2-D echocardiogram:  Study Conclusions - Left ventricle: The cavity size was normal. There was mild concentric hypertrophy. Systolic function was normal. The estimated ejection fraction was in the range of 50% to 55%. Images were inadequate for LV wall motion assessment. Diastolic dysfunction, indeterminate grade. - Aortic valve: Mildly calcified annulus. Trileaflet; mildly thickened, mildly calcified leaflets. Mild aortic stenosis. Peak velocity 2.28 m/s. Mean gradient 12 mmHg. There was moderate regurgitation. - Mitral valve: Normally functioning bioprosthetic mitral valve. There was no significant regurgitation. - Left atrium: The atrium was moderately dilated. Volume/bsa, S: 38.2 ml/m^2. - Right ventricle: Systolic function was mildly reduced. - Right atrium: The atrium was mildly dilated. - Tricuspid valve: There was mild-moderate regurgitation. - Pulmonary arteries: PA peak pressure: 38 mm Hg (S). Mildly elevated pulmonary pressures.  Antibiotics:  Cipro 3/3>>  HPI/Subjective: Overall, patient says that she is breathing better. The burning urination has decreased and almost resolved. She is sad because her surrogate mother has passed away.  Objective: Filed Vitals:   05/11/14 0612  BP: 121/49  Pulse: 63  Temp: 97.8 F (36.6 C)  Resp: 20   oxygen saturation 95% on supplemental oxygen.  Intake/Output Summary (Last 24 hours) at 05/11/14 1446 Last data filed at 05/11/14 0555  Gross per 24 hour  Intake    123 ml  Output   2850 ml  Net  -2727 ml   Filed Weights   05/09/14 0636 05/10/14 0538 05/11/14 0612  Weight: 81.602 kg  (179 lb 14.4 oz) 81.466 kg (179 lb 9.6 oz) 79.697 kg (175 lb 11.2 oz)    Exam:   General:  Alert 74 year old woman in no acute distress.  Cardiovascular: S1, S2, with a soft systolic murmur.  Respiratory: Occasional crackles, decreasing compared to yesterday.  Abdomen: Obese, positive bowel sounds, soft, nontender, nondistended.  Musculoskeletal: Decrease in pedal/lower extremity edema down to trace  with chronic venous stasis dermatitis changes.  Psychiatric: She is sad and tearful that her surrogate mother has passed away.  Neurologic: She is alert and oriented 3. Her speech is clear. Cranial nerves are grossly intact.   Data Reviewed: Basic Metabolic Panel:  Recent Labs Lab 05/08/14 0733 05/09/14 1135 05/10/14 0651 05/11/14 0619  NA 141 139 140 139  K 3.6 3.8 3.9 3.6  CL 102 102 101 96  CO2 29 31 32 36*  GLUCOSE 92 125* 103* 88  BUN 23 19 21 21   CREATININE 1.98* 1.89* 1.83* 1.79*  CALCIUM 8.8 8.3* 8.6 8.8   Liver Function Tests: No results for input(s): AST, ALT, ALKPHOS, BILITOT, PROT, ALBUMIN in the last 168 hours. No results for input(s): LIPASE, AMYLASE in the last 168 hours. No results for input(s): AMMONIA in the last 168 hours. CBC:  Recent Labs Lab 05/08/14 0733 05/09/14 0547 05/09/14 1453 05/10/14 0651 05/11/14 0619  WBC 4.4 3.5* 3.2* 4.0 3.9*  NEUTROABS 3.3  --   --   --   --   HGB 8.7* 7.7* 7.7* 8.9* 9.1*  HCT 28.6* 25.3* 25.6* 29.1* 29.9*  MCV 92.0 93.7 93.8 93.9 93.7  PLT 167 165 164 153 154   Cardiac Enzymes:  Recent Labs Lab 05/08/14 0733 05/08/14 1541 05/09/14 0547  TROPONINI 0.06* 0.05* 0.04*  BNP (last 3 results) No results for input(s): BNP in the last 8760 hours.  ProBNP (last 3 results)  Recent Labs  10/23/13 0806 11/29/13 0950 02/15/14 1040  PROBNP 1867.0* 2233.0* 2802.0*    CBG: No results for input(s): GLUCAP in the last 168 hours.  No results found for this or any previous visit (from the past 240  hour(s)).   Studies: No results found.  Scheduled Meds: . amiodarone  100 mg Oral Daily  . ciprofloxacin  500 mg Oral QPM  . colchicine  0.3 mg Oral Daily  . ferrous sulfate  325 mg Oral TID WC  . fesoterodine  4 mg Oral Daily  . furosemide  60 mg Intravenous 3 times per day  . levothyroxine  50 mcg Oral QAC breakfast  . pantoprazole  40 mg Oral Daily  . polyethylene glycol  17 g Oral Daily  . potassium chloride SA  40 mEq Oral Daily  . senna  1 tablet Oral BID  . simvastatin  10 mg Oral q1800  . sodium chloride  3 mL Intravenous Q12H  . sodium chloride  3 mL Intravenous Q12H  . warfarin  2 mg Oral Once  . Warfarin - Pharmacist Dosing Inpatient   Does not apply Q24H   Continuous Infusions:   Assessment and plan:  Principal Problem:   Acute on chronic diastolic heart failure Active Problems:   Anemia in chronic kidney disease   Anemia, iron deficiency   UTI (urinary tract infection)   HTN (hypertension)   Mitral regurgitation   Chronic kidney disease   Paroxysmal atrial fibrillation   Obesity   Elevated troponin   Hypothyroidism   1. Acute on chronic diastolic heart failure. Echo revealed an ejection fraction of 50-55%; indeterminate grade diastolic dysfunction; mild pulmonary hypertension; mild to moderate TR. She is clinically improving and is diuresing better. At -3 L now. She continues on IV Lasix titrated up to 60 mg 3 times a day. Metolazone given 1 for more brisk diuresis. I believe she needs at least 2-3 more days of aggressive diuresis. Home dose of Demadex is currently on hold.  Mitral regurgitation, status post previous MAZE procedure with bioprosthetic valve. Currently stable per echo.  Paroxysmal atrial fibrillation. Chronically anticoagulated with Coumadin. Her rate is controlled on amiodarone. She is in normal sinus rhythm per my exam. TSH was within normal limits.  Elevated troponin I. Minimal elevation, likely secondary to demand ischemia  with acute heart failure. Patient has had no chest pain.  Anemia, secondary to chronic disease and iron deficiency. Hemoglobin was 8.7 on admission, but decrease to 7.7 with no obvious evidence of GI or GU bleeding. Status post 1 unit of packed red blood cell transfusion on 3/3 with improvement in hemoglobin. Protonix started empirically. Ferrous sulfate continued. Patient has never had a colonoscopy and has refused in the past. I encouraged her to reconsider. Would recommend outpatient GI follow-up. This was discussed with her daughter on 3/2.  Hypertension. Currently controlled.  Stage III to stage IV chronic kidney disease. Patient's creatinine was 1.98 on admission. It is improving with treatment of congestive heart failure.  Chronic respiratory failure secondary to oxygen dependent COPD. Currently stable.  UTI. Patient started on Cipro. Urine culture is pending.   Time spent: 25 minutes    [facility] Pager [phone] If 7PM-7AM, please contact night-coverage at [email or web address], password [facility] 05/11/2014, 2:46 PM  LOS: 3 days

## 2014-05-11 NOTE — Progress Notes (Signed)
[place] for Coumadin Indication: atrial fibrillation  Allergies  Allergen Reactions  . Indomethacin Other (See Comments)    dizziness  . Norvasc [Amlodipine Besylate] Cough   Patient Measurements: Height: 5\' 2"  (157.5 cm) Weight: 175 lb 11.2 oz (79.697 kg) IBW/kg (Calculated) : 50.1  Vital Signs: Temp: 97.8 F (36.6 C) (03/05 0612) Temp Source: Oral (03/05 0612) BP: 121/49 mmHg (03/05 0612) Pulse Rate: 63 (03/05 0612)  Labs:  Recent Labs  05/08/14 1541  05/09/14 0547 05/09/14 1135 05/09/14 1453 05/10/14 0651 05/11/14 0619  HGB  --   < > 7.7*  --  7.7* 8.9* 9.1*  HCT  --   < > 25.3*  --  25.6* 29.1* 29.9*  PLT  --   < > 165  --  164 153 154  LABPROT  --   --  23.2*  --   --  27.7* 27.9*  INR  --   --  2.04*  --   --  2.56* 2.58*  CREATININE  --   --   --  1.89*  --  1.83* 1.79*  TROPONINI 0.05*  --  0.04*  --   --   --   --   < > = values in this interval not displayed. Estimated Creatinine Clearance: 27.4 mL/min (by C-G formula based on Cr of 1.79).  Medical History: Past Medical History  Diagnosis Date  . COPD (chronic obstructive pulmonary disease)   . History of TIA (transient ischemic attack)     Diagnosed 2003  . History of stroke     2000 [place]  . Obesity   . Juvenile rheumatic fever     Age 3  . Essential hypertension, benign   . Hyperlipidemia   . Mitral regurgitation     Status post bioprosthetic MVR (05/2013)  . CKD (chronic kidney disease) stage 4, GFR 15-29 ml/min   . Paroxysmal atrial fibrillation     s/p MAZE (05/2013)  . Chronic diastolic congestive heart failure     a) ECHO (09/2013) EF 50-55%  . Arthritis   . Aortic insufficiency due to bicuspid aortic valve     Moderate by TEE  . 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 S/P Maze operation for  atrial fibrillation 05/16/2013    Complete bilateral atrial lesion set using cryothermy and bipolar radiofrequency ablation with oversewing of LA appendage  . NICM (nonischemic cardiomyopathy)     a) LHC (04/2013) no significant CAD   Medications:  Prescriptions prior to admission  Medication Sig Dispense Refill Last Dose  . amiodarone (PACERONE) 100 MG tablet Take 1 tablet (100 mg total) by mouth daily. 30 tablet 3 05/07/2014 at Unknown time  . colchicine 0.6 MG tablet Take 0.5 tablets (0.3 mg total) by mouth daily. 30 tablet 0 05/07/2014 at Unknown time  . ferrous sulfate 325 (65 FE) MG EC tablet Take 325 mg by mouth 3 (three) times daily with meals.   05/07/2014 at Unknown time  . levothyroxine (SYNTHROID, LEVOTHROID) 50 MCG tablet Take 50 mcg by mouth daily before breakfast.   05/07/2014 at Unknown time  . metolazone (ZAROXOLYN) 2.5 MG tablet Take 1 tablet (2.5 mg total) by mouth as directed. (Patient taking differently: Take 2.5 mg by mouth daily. ) 5 tablet 3 05/07/2014 at Unknown time  . potassium chloride SA (K-DUR,KLOR-CON) 20 MEQ  tablet Take 1 tablet (20 mEq total) by mouth daily. 60 tablet 6 05/07/2014 at Unknown time  . simvastatin (ZOCOR) 10 MG tablet TAKE ONE TABLET BY MOUTH EVERY EVENING. 30 tablet 3 05/07/2014 at Unknown time  . tolterodine (DETROL LA) 4 MG 24 hr capsule Take 4 mg by mouth daily.   05/07/2014 at Unknown time  . torsemide (DEMADEX) 100 MG tablet Take 1 tablet (100 mg total) by mouth every morning. 90 tablet 3 05/07/2014 at Unknown time  . warfarin (COUMADIN) 2.5 MG tablet Take 1.25-2.5 mg by mouth daily at 6 PM. Take 1 tab (2.5mg ) on Tues, Thurs, Sat, Sun and take 1/2 tab (1.25mg ) on Mon, Wed, Fri   05/07/2014 at Unknown time   Assessment: 74yo female on chronic Coumadin for h/o afib.  INR was sub-therapeutic on admission.  She was seen in Coumadin clinic on 3/1 with INR = 1.7 and home dose was increased to 13.75mg /week (regimen listed above).   INR is therapeutic today.  No bleeding  noted.  Patient is also on Cipro for UTI which can potentially interact with warfarin to increase INR.  Goal of Therapy:  INR 2-3   Plan:  Coumadin 2mg  po x1 today  INR daily   Biagio Borg 05/11/2014,9:44 AM

## 2014-05-11 NOTE — Plan of Care (Signed)
Problem: Phase I Progression Outcomes Goal: Voiding-avoid urinary catheter unless indicated Outcome: Progressing Aggressive diuresis  Problem: Phase II Progression Outcomes Goal: Dyspnea controlled with activity Outcome: Completed/Met Date Met:  05/11/14 At baseline

## 2014-05-12 LAB — BASIC METABOLIC PANEL
ANION GAP: 6 (ref 5–15)
BUN: 24 mg/dL — ABNORMAL HIGH (ref 6–23)
CALCIUM: 8.6 mg/dL (ref 8.4–10.5)
CO2: 39 mmol/L — AB (ref 19–32)
CREATININE: 1.79 mg/dL — AB (ref 0.50–1.10)
Chloride: 93 mmol/L — ABNORMAL LOW (ref 96–112)
GFR calc non Af Amer: 27 mL/min — ABNORMAL LOW (ref >90)
GFR, EST AFRICAN AMERICAN: 31 mL/min — AB (ref >90)
Glucose, Bld: 92 mg/dL (ref 70–99)
Potassium: 3.4 mmol/L — ABNORMAL LOW (ref 3.5–5.1)
SODIUM: 138 mmol/L (ref 135–145)

## 2014-05-12 LAB — PROTIME-INR
INR: 2.43 — ABNORMAL HIGH (ref 0.00–1.49)
Prothrombin Time: 26.6 seconds — ABNORMAL HIGH (ref 11.6–15.2)

## 2014-05-12 MED ORDER — TORSEMIDE 20 MG PO TABS
100.0000 mg | ORAL_TABLET | Freq: Every day | ORAL | Status: DC
Start: 2014-05-12 — End: 2014-05-13

## 2014-05-12 MED ORDER — WARFARIN SODIUM 2 MG PO TABS
2.0000 mg | ORAL_TABLET | Freq: Once | ORAL | Status: AC
  Administered 2014-05-12: 2 mg via ORAL
  Filled 2014-05-12: qty 1

## 2014-05-12 MED ORDER — POLYETHYLENE GLYCOL 3350 17 G PO PACK: 17.0000 g | PACK | Freq: Every day | ORAL | Status: DC | PRN

## 2014-05-12 MED ORDER — SENNA 8.6 MG PO TABS: 1.0000 | ORAL_TABLET | Freq: Every evening | ORAL | Status: DC | PRN

## 2014-05-12 NOTE — Progress Notes (Signed)
[place] for Coumadin Indication: atrial fibrillation  Allergies  Allergen Reactions  . Indomethacin Other (See Comments)    dizziness  . Norvasc [Amlodipine Besylate] Cough   Patient Measurements: Height: 5\' 2"  (157.5 cm) Weight: 171 lb 1.6 oz (77.61 kg) IBW/kg (Calculated) : 50.1  Vital Signs: Temp: 98.8 F (37.1 C) (03/06 0542) Temp Source: Oral (03/06 0542) BP: 106/44 mmHg (03/06 0542) Pulse Rate: 60 (03/06 0542)  Labs:  Recent Labs  05/09/14 1453 05/10/14 0651 05/11/14 0619 05/12/14 0621  HGB 7.7* 8.9* 9.1*  --   HCT 25.6* 29.1* 29.9*  --   PLT 164 153 154  --   LABPROT  --  27.7* 27.9* 26.6*  INR  --  2.56* 2.58* 2.43*  CREATININE  --  1.83* 1.79* 1.79*   Estimated Creatinine Clearance: 27 mL/min (by C-G formula based on Cr of 1.79).  Medical History: Past Medical History  Diagnosis Date  . COPD (chronic obstructive pulmonary disease)   . History of TIA (transient ischemic attack)     Diagnosed 2003  . History of stroke     2000 [place]  . Obesity   . Juvenile rheumatic fever     Age 33  . Essential hypertension, benign   . Hyperlipidemia   . Mitral regurgitation     Status post bioprosthetic MVR (05/2013)  . CKD (chronic kidney disease) stage 4, GFR 15-29 ml/min   . Paroxysmal atrial fibrillation     s/p MAZE (05/2013)  . Chronic diastolic congestive heart failure     a) ECHO (09/2013) EF 50-55%  . Arthritis   . Aortic insufficiency due to bicuspid aortic valve     Moderate by TEE  . 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 S/P Maze operation for atrial fibrillation 05/16/2013    Complete bilateral atrial lesion set using cryothermy and bipolar radiofrequency ablation with oversewing of LA appendage  . NICM (nonischemic cardiomyopathy)     a) LHC (04/2013) no  significant CAD   Medications:  Prescriptions prior to admission  Medication Sig Dispense Refill Last Dose  . amiodarone (PACERONE) 100 MG tablet Take 1 tablet (100 mg total) by mouth daily. 30 tablet 3 05/07/2014 at Unknown time  . colchicine 0.6 MG tablet Take 0.5 tablets (0.3 mg total) by mouth daily. 30 tablet 0 05/07/2014 at Unknown time  . ferrous sulfate 325 (65 FE) MG EC tablet Take 325 mg by mouth 3 (three) times daily with meals.   05/07/2014 at Unknown time  . levothyroxine (SYNTHROID, LEVOTHROID) 50 MCG tablet Take 50 mcg by mouth daily before breakfast.   05/07/2014 at Unknown time  . metolazone (ZAROXOLYN) 2.5 MG tablet Take 1 tablet (2.5 mg total) by mouth as directed. (Patient taking differently: Take 2.5 mg by mouth daily. ) 5 tablet 3 05/07/2014 at Unknown time  . potassium chloride SA (K-DUR,KLOR-CON) 20 MEQ tablet Take 1 tablet (20 mEq total) by mouth daily. 60 tablet 6 05/07/2014 at Unknown time  . simvastatin (ZOCOR) 10 MG tablet TAKE ONE TABLET BY MOUTH EVERY EVENING. 30 tablet 3 05/07/2014 at Unknown time  . tolterodine (DETROL LA) 4 MG 24 hr capsule Take 4 mg by mouth daily.   05/07/2014 at Unknown time  . torsemide (DEMADEX) 100 MG tablet Take 1 tablet (100 mg total) by mouth every morning. 90 tablet 3  05/07/2014 at Unknown time  . warfarin (COUMADIN) 2.5 MG tablet Take 1.25-2.5 mg by mouth daily at 6 PM. Take 1 tab (2.5mg ) on Tues, Thurs, Sat, Sun and take 1/2 tab (1.25mg ) on Mon, Wed, Fri   05/07/2014 at Unknown time   Assessment: 74yo female on chronic Coumadin for h/o afib.  INR was sub-therapeutic on admission.  She was seen in Coumadin clinic on 3/1 with INR = 1.7 and home dose was increased to 13.75mg /week (regimen listed above).   INR is therapeutic today.  No bleeding noted.  Patient is also on Cipro for UTI which can potentially interact with warfarin to increase INR.  Goal of Therapy:  INR 2-3   Plan:  Coumadin 2mg  po x1 today  INR daily   Biagio Borg 05/12/2014,8:14 AM

## 2014-05-12 NOTE — Progress Notes (Signed)
[facility] PROGRESS NOTE  [name] [name] [id number] DOB: [birth year] DOA: 05/08/2014 PCP: [name], MD    Code Status: Partial Family Communication: Discussed with sister. Disposition Plan: Discharge to home when clinically improved, likely in 2 days.   Consultants:  Cardiology  Procedures:  2-D echocardiogram:  Study Conclusions - Left ventricle: The cavity size was normal. There was mild concentric hypertrophy. Systolic function was normal. The estimated ejection fraction was in the range of 50% to 55%. Images were inadequate for LV wall motion assessment. Diastolic dysfunction, indeterminate grade. - Aortic valve: Mildly calcified annulus. Trileaflet; mildly thickened, mildly calcified leaflets. Mild aortic stenosis. Peak velocity 2.28 m/s. Mean gradient 12 mmHg. There was moderate regurgitation. - Mitral valve: Normally functioning bioprosthetic mitral valve. There was no significant regurgitation. - Left atrium: The atrium was moderately dilated. Volume/bsa, S: 38.2 ml/m^2. - Right ventricle: Systolic function was mildly reduced. - Right atrium: The atrium was mildly dilated. - Tricuspid valve: There was mild-moderate regurgitation. - Pulmonary arteries: PA peak pressure: 38 mm Hg (S). Mildly elevated pulmonary pressures.  Antibiotics:  Cipro 3/3>>  HPI/Subjective: Overall, patient says that she is breathing better. The burning urination has resolved. She has had a couple of loose stools following laxative therapy after not having a bowel movement for several days.  Objective: Filed Vitals:   05/12/14 0542  BP: 106/44  Pulse: 60  Temp: 98.8 F (37.1 C)  Resp: 20   oxygen saturation 97% on supplemental oxygen.  Intake/Output Summary (Last 24 hours) at 05/12/14 1516 Last data filed at 05/12/14 1226  Gross per 24 hour  Intake    720 ml  Output   4400 ml  Net  -3680 ml   Filed Weights   05/11/14 0612 05/12/14  0500 05/12/14 0542  Weight: 79.697 kg (175 lb 11.2 oz) 78.699 kg (173 lb 8 oz) 77.61 kg (171 lb 1.6 oz)    Exam:   General:  Alert 74 year old woman in no acute distress.  Cardiovascular: S1, S2, with a soft systolic murmur.  Respiratory: Occasional crackles in the bases, decreasing compared to yesterday.  Abdomen: Obese, positive bowel sounds, soft, nontender, nondistended.  Musculoskeletal: Decrease in pedal/lower extremity edema down to trace  with chronic venous stasis dermatitis changes.  Neurologic: She is alert and oriented 3. Her speech is clear. Cranial nerves are grossly intact.   Data Reviewed: Basic Metabolic Panel:  Recent Labs Lab 05/08/14 0733 05/09/14 1135 05/10/14 0651 05/11/14 0619 05/12/14 0621  NA 141 139 140 139 138  K 3.6 3.8 3.9 3.6 3.4*  CL 102 102 101 96 93*  CO2 29 31 32 36* 39*  GLUCOSE 92 125* 103* 88 92  BUN 23 19 21 21  24*  CREATININE 1.98* 1.89* 1.83* 1.79* 1.79*  CALCIUM 8.8 8.3* 8.6 8.8 8.6   Liver Function Tests: No results for input(s): AST, ALT, ALKPHOS, BILITOT, PROT, ALBUMIN in the last 168 hours. No results for input(s): LIPASE, AMYLASE in the last 168 hours. No results for input(s): AMMONIA in the last 168 hours. CBC:  Recent Labs Lab 05/08/14 0733 05/09/14 0547 05/09/14 1453 05/10/14 0651 05/11/14 0619  WBC 4.4 3.5* 3.2* 4.0 3.9*  NEUTROABS 3.3  --   --   --   --   HGB 8.7* 7.7* 7.7* 8.9* 9.1*  HCT 28.6* 25.3* 25.6* 29.1* 29.9*  MCV 92.0 93.7 93.8 93.9 93.7  PLT 167 165 164 153 154   Cardiac Enzymes:  Recent Labs Lab 05/08/14 0733 05/08/14 1541 05/09/14 0547  TROPONINI 0.06* 0.05* 0.04*   BNP (last 3 results) No results for input(s): BNP in the last 8760 hours.  ProBNP (last 3 results)  Recent Labs  10/23/13 0806 11/29/13 0950 02/15/14 1040  PROBNP 1867.0* 2233.0* 2802.0*    CBG: No results for input(s): GLUCAP in the last 168 hours.  No results found for this or any previous visit (from the  past 240 hour(s)).   Studies: No results found.  Scheduled Meds: . amiodarone  100 mg Oral Daily  . ciprofloxacin  500 mg Oral QPM  . colchicine  0.3 mg Oral Daily  . ferrous sulfate  325 mg Oral TID WC  . fesoterodine  4 mg Oral Daily  . furosemide  60 mg Intravenous 3 times per day  . levothyroxine  50 mcg Oral QAC breakfast  . pantoprazole  40 mg Oral Daily  . polyethylene glycol  17 g Oral Daily  . potassium chloride SA  40 mEq Oral Daily  . senna  1 tablet Oral BID  . simvastatin  10 mg Oral q1800  . sodium chloride  3 mL Intravenous Q12H  . sodium chloride  3 mL Intravenous Q12H  . warfarin  2 mg Oral Once  . Warfarin - Pharmacist Dosing Inpatient   Does not apply Q24H   Continuous Infusions:   Assessment and plan:  Principal Problem:   Acute on chronic diastolic heart failure Active Problems:   Anemia in chronic kidney disease   Anemia, iron deficiency   UTI (urinary tract infection)   HTN (hypertension)   Mitral regurgitation   Chronic kidney disease   Paroxysmal atrial fibrillation   Obesity   Elevated troponin   Hypothyroidism   1. Acute on chronic diastolic heart failure. The patient and family reported dietary indiscretion for several days prior to hospital admission.  Her Echo revealed an ejection fraction of 50-55%; indeterminate grade diastolic dysfunction; mild pulmonary hypertension; mild to moderate TR. She is clinically improving and is diuresing better; -6.7 L as of today.  IV Lasix titrated up to 60 mg 3 times a day several days ago and Metolazone was given given 1 for more brisk diuresis. Her BUN and CO2 has increased so she may be slightly volume contracted. Therefore, will discontinue IV Lasix and restart oral Demadex tomorrow.  Mitral regurgitation, status post previous MAZE procedure with bioprosthetic valve. Currently stable per echo.  Paroxysmal atrial fibrillation. Chronically anticoagulated with Coumadin. Her rate is controlled on  amiodarone. She was in normal sinus rhythm per my exam. TSH was within normal limits.  Elevated troponin I. Minimal elevation, likely secondary to demand ischemia with acute heart failure. Patient has had no chest pain.  Anemia, secondary to chronic disease and iron deficiency. Hemoglobin was 8.7 on admission, but decrease to 7.7 with no obvious evidence of GI or GU bleeding. Status post 1 unit of packed red blood cell transfusion on 3/3 with improvement in hemoglobin. Protonix started empirically. Ferrous sulfate continued. Patient has never had a colonoscopy and has refused in the past. I encouraged her to reconsider. Would recommend outpatient GI follow-up. This was discussed with her daughter on 3/2.  Hypertension. Currently controlled.  Stage III to stage IV chronic kidney disease. Patient's creatinine was 1.98 on admission. It has improved with treatment of congestive heart failure.  Chronic respiratory failure secondary to oxygen dependent COPD. Currently stable.  UTI. Patient started on Cipro. Urine culture is pending.   Time spent: 25 minutes    [facility] [place] [facility]  Pager [phone] If 7PM-7AM, please contact night-coverage at [email or web address], password [facility] 05/12/2014, 3:16 PM  LOS: 4 days

## 2014-05-13 DIAGNOSIS — E038 Other specified hypothyroidism: Secondary | ICD-10-CM

## 2014-05-13 LAB — CBC
HEMATOCRIT: 29.8 % — AB (ref 36.0–46.0)
Hemoglobin: 8.8 g/dL — ABNORMAL LOW (ref 12.0–15.0)
MCH: 28.1 pg (ref 26.0–34.0)
MCHC: 29.5 g/dL — ABNORMAL LOW (ref 30.0–36.0)
MCV: 95.2 fL (ref 78.0–100.0)
Platelets: 161 10*3/uL (ref 150–400)
RBC: 3.13 MIL/uL — ABNORMAL LOW (ref 3.87–5.11)
RDW: 19.1 % — ABNORMAL HIGH (ref 11.5–15.5)
WBC: 3.1 10*3/uL — ABNORMAL LOW (ref 4.0–10.5)

## 2014-05-13 LAB — BLOOD GAS, ARTERIAL
ACID-BASE EXCESS: 14 mmol/L — AB (ref 0.0–2.0)
BICARBONATE: 39.4 meq/L — AB (ref 20.0–24.0)
DRAWN BY: 27047
O2 Content: 2 L/min
O2 SAT: 94.9 %
PATIENT TEMPERATURE: 37
TCO2: 35.4 mmol/L (ref 0–100)
pCO2 arterial: 63.3 mmHg (ref 35.0–45.0)
pH, Arterial: 7.41 (ref 7.350–7.450)
pO2, Arterial: 80.1 mmHg (ref 80.0–100.0)

## 2014-05-13 LAB — TYPE AND SCREEN
ABO/RH(D): B POS
ANTIBODY SCREEN: NEGATIVE
Unit division: 0
Unit division: 0

## 2014-05-13 LAB — BASIC METABOLIC PANEL
ANION GAP: 7 (ref 5–15)
BUN: 27 mg/dL — AB (ref 6–23)
CHLORIDE: 93 mmol/L — AB (ref 96–112)
CO2: 40 mmol/L (ref 19–32)
Calcium: 8.8 mg/dL (ref 8.4–10.5)
Creatinine, Ser: 1.93 mg/dL — ABNORMAL HIGH (ref 0.50–1.10)
GFR, EST AFRICAN AMERICAN: 29 mL/min — AB (ref >90)
GFR, EST NON AFRICAN AMERICAN: 25 mL/min — AB (ref >90)
GLUCOSE: 96 mg/dL (ref 70–99)
Potassium: 3.9 mmol/L (ref 3.5–5.1)
SODIUM: 140 mmol/L (ref 135–145)

## 2014-05-13 LAB — PROTIME-INR
INR: 2.42 — AB (ref 0.00–1.49)
PROTHROMBIN TIME: 26.5 s — AB (ref 11.6–15.2)

## 2014-05-13 MED ORDER — METOLAZONE 2.5 MG PO TABS: 2.5000 mg | ORAL_TABLET | ORAL | Status: DC

## 2014-05-13 MED ORDER — OMEPRAZOLE 20 MG PO CPDR: 20.0000 mg | DELAYED_RELEASE_CAPSULE | Freq: Every day | ORAL | Status: DC

## 2014-05-13 MED ORDER — TORSEMIDE 100 MG PO TABS: ORAL_TABLET | ORAL | Status: DC

## 2014-05-13 MED ORDER — WARFARIN SODIUM 2 MG PO TABS: 2.0000 mg | ORAL_TABLET | Freq: Once | ORAL | Status: DC

## 2014-05-13 MED ORDER — CIPROFLOXACIN HCL 500 MG PO TABS: 500.0000 mg | ORAL_TABLET | Freq: Every evening | ORAL | Status: DC

## 2014-05-13 NOTE — Progress Notes (Signed)
[facility] for Coumadin Indication: atrial fibrillation  Allergies  Allergen Reactions  . Indomethacin Other (See Comments)    dizziness  . Norvasc [Amlodipine Besylate] Cough   Patient Measurements: Height: 5\' 2"  (157.5 cm) Weight: 172 lb (78.019 kg) IBW/kg (Calculated) : 50.1  Vital Signs: Temp: 98.5 F (36.9 C) (03/07 0608) Temp Source: Oral (03/07 0608) BP: 120/68 mmHg (03/07 0608) Pulse Rate: 67 (03/07 0608)  Labs:  Recent Labs  05/11/14 0619 05/12/14 0621 05/13/14 0625  HGB 9.1*  --  8.8*  HCT 29.9*  --  29.8*  PLT 154  --  161  LABPROT 27.9* 26.6* 26.5*  INR 2.58* 2.43* 2.42*  CREATININE 1.79* 1.79* 1.93*   Estimated Creatinine Clearance: 25.1 mL/min (by C-G formula based on Cr of 1.93).  Medical History: Past Medical History  Diagnosis Date  . COPD (chronic obstructive pulmonary disease)   . History of TIA (transient ischemic attack)     Diagnosed 2003  . History of stroke     2000 [place]  . Obesity   . Juvenile rheumatic fever     Age 1  . Essential hypertension, benign   . Hyperlipidemia   . Mitral regurgitation     Status post bioprosthetic MVR (05/2013)  . CKD (chronic kidney disease) stage 4, GFR 15-29 ml/min   . Paroxysmal atrial fibrillation     s/p MAZE (05/2013)  . Chronic diastolic congestive heart failure     a) ECHO (09/2013) EF 50-55%  . Arthritis   . Aortic insufficiency due to bicuspid aortic valve     Moderate by TEE  . 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 S/P Maze operation for atrial fibrillation 05/16/2013    Complete bilateral atrial lesion set using cryothermy and bipolar radiofrequency ablation with oversewing of LA appendage  . NICM (nonischemic cardiomyopathy)     a) LHC (04/2013) no significant CAD   Medications:  Prescriptions  prior to admission  Medication Sig Dispense Refill Last Dose  . amiodarone (PACERONE) 100 MG tablet Take 1 tablet (100 mg total) by mouth daily. 30 tablet 3 05/07/2014 at Unknown time  . colchicine 0.6 MG tablet Take 0.5 tablets (0.3 mg total) by mouth daily. 30 tablet 0 05/07/2014 at Unknown time  . ferrous sulfate 325 (65 FE) MG EC tablet Take 325 mg by mouth 3 (three) times daily with meals.   05/07/2014 at Unknown time  . levothyroxine (SYNTHROID, LEVOTHROID) 50 MCG tablet Take 50 mcg by mouth daily before breakfast.   05/07/2014 at Unknown time  . metolazone (ZAROXOLYN) 2.5 MG tablet Take 1 tablet (2.5 mg total) by mouth as directed. (Patient taking differently: Take 2.5 mg by mouth daily. ) 5 tablet 3 05/07/2014 at Unknown time  . potassium chloride SA (K-DUR,KLOR-CON) 20 MEQ tablet Take 1 tablet (20 mEq total) by mouth daily. 60 tablet 6 05/07/2014 at Unknown time  . simvastatin (ZOCOR) 10 MG tablet TAKE ONE TABLET BY MOUTH EVERY EVENING. 30 tablet 3 05/07/2014 at Unknown time  . tolterodine (DETROL LA) 4 MG 24 hr capsule Take 4 mg by mouth daily.   05/07/2014 at Unknown time  . torsemide (DEMADEX) 100 MG tablet Take 1 tablet (100 mg total) by mouth every morning. 90 tablet 3 05/07/2014 at Unknown time  . warfarin (COUMADIN) 2.5 MG tablet Take 1.25-2.5 mg by mouth  daily at 6 PM. Take 1 tab (2.5mg ) on Tues, Thurs, Sat, Sun and take 1/2 tab (1.25mg ) on Mon, Wed, Fri   05/07/2014 at Unknown time   Assessment: 74yo female on chronic Coumadin for h/o afib.  INR was sub-therapeutic on admission.  She was seen in Coumadin clinic on 3/1 with INR = 1.7 and home dose was increased to 13.75mg /week (regimen listed above).   INR is therapeutic today.  No bleeding noted.  Patient is also on Cipro for UTI which can potentially interact with warfarin to increase INR.  Goal of Therapy:  INR 2-3   Plan:  Coumadin 2mg  po x1 today  INR daily  Recommend d/c home on previous home regimen.  F/U with Coumadin  clinic.  Biagio Borg 05/13/2014,10:21 AM

## 2014-05-13 NOTE — Discharge Summary (Signed)
Physician Discharge Summary  [name] [name] RAQ:[id number] DOB: [birth year] DOA: 05/08/2014  PCP: [name], MD  Admit date: 05/08/2014 Discharge date: 05/13/2014  Time spent: Greater than 30 minutes  Recommendations for Outpatient Follow-up:  1. Recommend follow-up of the patient's basic metabolic panel, specifically her CO2.  2. Recommend GI outpatient evaluation for further workup of anemia; deferred to her PCP.   Discharge Diagnoses:  1. Acute on chronic diastolic heart failure, secondary to dietary indiscretion. --- Patient diuresed -7.4 L. --- Weight on admission was 180 pounds. Weight at time of discharge 172 pounds. --- Developed contraction alkalosis. 2. Mitral regurgitation, status post previous MAZE procedure with bioprosthetic valve. Stable per echo. 3. Paroxysmal atrial fibrillation, treated with Coumadin and amiodarone. 4. Elevated troponin I secondary to demand ischemia. 5. Anemia secondary to chronic disease and iron deficiency. --- Status post 1 unit of packed red blood cell transfusion. --- Recommend outpatient GI evaluation. 6. Urinary tract infection. 7. Chronic respiratory failure secondary to oxygen dependent COPD. 8. Hypertension. 9. Stage III stage IV chronic kidney disease. 10. Obesity.   Discharge Condition: Improved.  Diet recommendation: Heart healthy/low-sodium.   Filed Weights   05/12/14 0500 05/12/14 0542 05/13/14 0608  Weight: 78.699 kg (173 lb 8 oz) 77.61 kg (171 lb 1.6 oz) 78.019 kg (172 lb)    History of present illness:  The patient is a 74 year old woman with a history of rheumatic fever during childhood, chronic diastolic heart failure, chronic kidney disease, paroxysmal atrial fibrillation/status post MAZE procedure with mitral valve replacement, who presented to the emergency department on 05/08/14 with a chief complaint of shortness of breath. Both the patient and the family admitted that she has been eating high salt meats and high  salt breads like biscuits. In the ED, she was afebrile and hemodynamically stable. Her lab data were significant for creatinine of 1.98, troponin I 0.06, hemoglobin of 8.7, and INR of 1.67. Her chest x-ray revealed moderate pulmonary edema. She was admitted for further evaluation and management.  Hospital Course:    1. Acute on chronic diastolic heart failure. The patient and family reported dietary indiscretion for several days prior to hospital admission.  She was started on treatment with IV Lasix every 12 hours. Most of not all of her chronic medications were continued with the exception of oral Demadex and metolazone. Cardiology was consulted and increased the Lasix to every 8 hours and added 1 dose of oral metolazone as she did not appear to be diuresing as well as expected. Her Echo revealed an ejection fraction of 50-55%; indeterminate grade diastolic dysfunction; mild pulmonary hypertension; mild to moderate TR. She began to diuresed more briskly. She developed some contraction alkalosis. IV Lasix was discontinued. At the time of discharge, she was instructed to resume Demadex, but at a lower dose of 50 mg for several days before resuming her normal dose of 100 mg daily. She was instructed to take metolazone only if she gained 2- 3 pounds in 24 hours. -She diuresed over 7 liters and lost 8 pounds. -She was instructed to follow a low-salt diet more closely.  Mitral regurgitation, status post previous MAZE procedure with bioprosthetic valve. Currently stable per echo.  Paroxysmal atrial fibrillation. Patient is chronically anticoagulated with Coumadin. Her rate was controlled on amiodarone. She was in normal sinus rhythm. TSH was within normal limits.  Elevated troponin I. Minimal elevation, likely secondary to demand ischemia with acute heart failure. Cardiology agreed. Patient had no chest pain.  Anemia, secondary to chronic  disease and iron deficiency. Patient's hemoglobin was  8.7 on admission, but decreased to 7.7 with no obvious evidence of GI or GU bleeding. Anemia panel revealed a total iron of 31, ferritin of 29, vitamin B12 was greater than 2000. She was transfused 1 unit of packed red blood cells with improvement in hemoglobin. Protonix was started empirically. Her home dose of ferrous sulfate was continued. Patient has never had a colonoscopy and had refused in the past. I encouraged her to reconsider. Would recommend an outpatient referral to gastroenterology. Her daughter was asked to discuss this with her primary care physician. She voiced understanding.  Hypertension. Her blood pressure was controlled and stable during hospitalization.  Stage III to stage IV chronic kidney disease. Patient's creatinine was 1.98 on admission. It had improved with treatment of congestive heart failure to 1.79, but increased to 1.93 at the time of discharge, likely from an element of contraction/prerenal azotemia.  Chronic respiratory failure secondary to oxygen dependent COPD. This remains stable on supplemental oxygen.  UTI. The patient complained of burning urination. Her urinalysis revealed pyuria and bacteriuria.  She was started on Cipro. Urine culture was pending at the time of discharge. She was discharged on 3 more days of treatment.   Procedures:  2-D echocardiogram: Study Conclusions - Left ventricle: The cavity size was normal. There was mild concentric hypertrophy. Systolic function was normal. The estimated ejection fraction was in the range of 50% to 55%. Images were inadequate for LV wall motion assessment. Diastolic dysfunction, indeterminate grade. - Aortic valve: Mildly calcified annulus. Trileaflet; mildly thickened, mildly calcified leaflets. Mild aortic stenosis. Peak velocity 2.28 m/s. Mean gradient 12 mmHg. There was moderate regurgitation. - Mitral valve: Normally functioning bioprosthetic mitral valve. There was no  significant regurgitation. - Left atrium: The atrium was moderately dilated. Volume/bsa, S: 38.2 ml/m^2. - Right ventricle: Systolic function was mildly reduced. - Right atrium: The atrium was mildly dilated. - Tricuspid valve: There was mild-moderate regurgitation. - Pulmonary arteries: PA peak pressure: 38 mm Hg (S). Mildly elevated pulmonary pressures.  Consultations:  Cardiology    Discharge Exam: Filed Vitals:   05/13/14 0608  BP: 120/68  Pulse: 67  Temp: 98.5 F (36.9 C)  Resp: 18     General: Alert 74 year old woman in no acute distress.  Cardiovascular: S1, S2, with a soft systolic murmur.  Respiratory: Faint crackles in the bases, decreased compared to yesterday.  Abdomen: Obese, positive bowel sounds, soft, nontender, nondistended.  Musculoskeletal: Decrease in pedal/lower extremity edema down to tracewith chronic venous stasis dermatitis changes.  Neurologic: She is alert and oriented 3. Her speech is clear. Cranial nerves are grossly intact.  Discharge Instructions   Discharge Instructions    Diet - low sodium heart healthy    Complete by:  As directed      Discharge instructions    Complete by:  As directed   Avoid high salt meats like hot dogs, sausage, ham, sausage, high salt canned meats and high salt breads like biscuts, and the like.     Increase activity slowly    Complete by:  As directed           Current Discharge Medication List    START taking these medications   Details  ciprofloxacin (CIPRO) 500 MG tablet Take 1 tablet (500 mg total) by mouth every evening. Antibiotic to be taken for 3 more days. Qty: 3 tablet, Refills: 0    omeprazole (PRILOSEC) 20 MG capsule Take 1 capsule (20 mg total)  by mouth daily. Medicine to help protect your stomach lining from ulcers while on coumadin Qty: 30 capsule, Refills: 3      CONTINUE these medications which have CHANGED   Details  metolazone (ZAROXOLYN) 2.5 MG tablet Take 1 tablet  (2.5 mg total) by mouth as directed. TAKE 1 TABLET AS NEEDED ONLY IF YOUR WEIGHT INCREASES 2-3 POUNDS IN 24 HOURS.    torsemide (DEMADEX) 100 MG tablet STARTING TOMORROW, TAKE A HALF A TABLET DAILY FOR 3 DAYS AND THEN RESTART ONE TABLET DAILY THEREAFTER.      CONTINUE these medications which have NOT CHANGED   Details  amiodarone (PACERONE) 100 MG tablet Take 1 tablet (100 mg total) by mouth daily. Qty: 30 tablet, Refills: 3    colchicine 0.6 MG tablet Take 0.5 tablets (0.3 mg total) by mouth daily. Qty: 30 tablet, Refills: 0    ferrous sulfate 325 (65 FE) MG EC tablet Take 325 mg by mouth 3 (three) times daily with meals.    levothyroxine (SYNTHROID, LEVOTHROID) 50 MCG tablet Take 50 mcg by mouth daily before breakfast.    potassium chloride SA (K-DUR,KLOR-CON) 20 MEQ tablet Take 1 tablet (20 mEq total) by mouth daily. Qty: 60 tablet, Refills: 6    simvastatin (ZOCOR) 10 MG tablet TAKE ONE TABLET BY MOUTH EVERY EVENING. Qty: 30 tablet, Refills: 3    tolterodine (DETROL LA) 4 MG 24 hr capsule Take 4 mg by mouth daily.    warfarin (COUMADIN) 2.5 MG tablet Take 1.25-2.5 mg by mouth daily at 6 PM. Take 1 tab (2.5mg ) on Tues, Thurs, Sat, Sun and take 1/2 tab (1.25mg ) on Mon, Wed, Fri       Allergies  Allergen Reactions  . Indomethacin Other (See Comments)    dizziness  . Norvasc [Amlodipine Besylate] Cough   Follow-up Information    Follow up with [facility].   Contact information:   [address] [place] [address] [phone]       Follow up with Jory Sims, NP On 05/20/2014.   Specialty:  Nurse Practitioner   Why:  2:10 Needs BMET drawn just before appt.   Contact information:   [place] [address] [phone]        The results of significant diagnostics from this hospitalization (including imaging, microbiology, ancillary and laboratory) are listed below for reference.    Significant Diagnostic Studies: Dg  Chest Port 1 View  05/08/2014   CLINICAL DATA:  Short of breath per 2 day  EXAM: PORTABLE CHEST - 1 VIEW  COMPARISON:  02/15/2014  FINDINGS: Severe cardiomegaly. Low lung volumes. Diffuse pulmonary edema. No pneumothorax.  IMPRESSION: Moderate CHF with pulmonary edema.  Worsening since the prior study.   Electronically Signed   By: Marybelle Killings M.D.   On: 05/08/2014 08:24    Microbiology: No results found for this or any previous visit (from the past 240 hour(s)).   Labs: Basic Metabolic Panel:  Recent Labs Lab 05/09/14 1135 05/10/14 0651 05/11/14 0619 05/12/14 0621 05/13/14 0625  NA 139 140 139 138 140  K 3.8 3.9 3.6 3.4* 3.9  CL 102 101 96 93* 93*  CO2 31 32 36* 39* 40*  GLUCOSE 125* 103* 88 92 96  BUN 19 21 21  24* 27*  CREATININE 1.89* 1.83* 1.79* 1.79* 1.93*  CALCIUM 8.3* 8.6 8.8 8.6 8.8   Liver Function Tests: No results for input(s): AST, ALT, ALKPHOS, BILITOT, PROT, ALBUMIN in the last 168 hours. No  results for input(s): LIPASE, AMYLASE in the last 168 hours. No results for input(s): AMMONIA in the last 168 hours. CBC:  Recent Labs Lab 05/08/14 0733 05/09/14 0547 05/09/14 1453 05/10/14 0651 05/11/14 0619 05/13/14 0625  WBC 4.4 3.5* 3.2* 4.0 3.9* 3.1*  NEUTROABS 3.3  --   --   --   --   --   HGB 8.7* 7.7* 7.7* 8.9* 9.1* 8.8*  HCT 28.6* 25.3* 25.6* 29.1* 29.9* 29.8*  MCV 92.0 93.7 93.8 93.9 93.7 95.2  PLT 167 165 164 153 154 161   Cardiac Enzymes:  Recent Labs Lab 05/08/14 0733 05/08/14 1541 05/09/14 0547  TROPONINI 0.06* 0.05* 0.04*   BNP: BNP (last 3 results) No results for input(s): BNP in the last 8760 hours.  ProBNP (last 3 results)  Recent Labs  10/23/13 0806 11/29/13 0950 02/15/14 1040  PROBNP 1867.0* 2233.0* 2802.0*    CBG: No results for input(s): GLUCAP in the last 168 hours.     Signed:  Khalif Stender  [facility] 05/13/2014, 10:53 AM

## 2014-05-13 NOTE — Progress Notes (Signed)
0731 MD notified of lab value CO2 - 40

## 2014-05-13 NOTE — Progress Notes (Signed)
0735 New orders given per Dr.Fisher to hold torsemide (DEMADEX) until ABG obtained d/t CO2-40.

## 2014-05-13 NOTE — Progress Notes (Signed)
Primary cardiologist: Dr. Carlyle Dolly  Seen for followup: Acute on chronic diastolic heart failure  Subjective:    Eager to go home. No shortness of breath or chest pain at rest.  Objective:   Temp:  [97.9 F (36.6 C)-98.5 F (36.9 C)] 98.5 F (36.9 C) (03/07 0608) Pulse Rate:  [59-74] 67 (03/07 0608) Resp:  [18-20] 18 (03/07 0608) BP: (108-120)/(52-71) 120/68 mmHg (03/07 0608) SpO2:  [94 %-97 %] 95 % (03/07 7124) Weight:  [172 lb (78.019 kg)] 172 lb (78.019 kg) (03/07 0608) Last BM Date: 05/12/14  Filed Weights   05/12/14 0500 05/12/14 0542 05/13/14 5809  Weight: 173 lb 8 oz (78.699 kg) 171 lb 1.6 oz (77.61 kg) 172 lb (78.019 kg)    Intake/Output Summary (Last 24 hours) at 05/13/14 0912 Last data filed at 05/13/14 0609  Gross per 24 hour  Intake    840 ml  Output   1300 ml  Net   -460 ml    Telemetry: Sinus rhythm.  Exam:  General: Appears comfortable at rest.  Lungs: Clear, nonlabored.  Cardiac: RRR with soft systolic murmur.  Extremities: No pitting edema.   Lab Results:  Basic Metabolic Panel:  Recent Labs Lab 05/11/14 0619 05/12/14 0621 05/13/14 0625  NA 139 138 140  K 3.6 3.4* 3.9  CL 96 93* 93*  CO2 36* 39* 40*  GLUCOSE 88 92 96  BUN 21 24* 27*  CREATININE 1.79* 1.79* 1.93*  CALCIUM 8.8 8.6 8.8    CBC:  Recent Labs Lab 05/10/14 0651 05/11/14 0619 05/13/14 0625  WBC 4.0 3.9* 3.1*  HGB 8.9* 9.1* 8.8*  HCT 29.1* 29.9* 29.8*  MCV 93.9 93.7 95.2  PLT 153 154 161    Cardiac Enzymes:  Recent Labs Lab 05/08/14 0733 05/08/14 1541 05/09/14 0547  TROPONINI 0.06* 0.05* 0.04*    BNP:  Recent Labs  10/23/13 0806 11/29/13 0950 02/15/14 1040  PROBNP 1867.0* 2233.0* 2802.0*    Coagulation:  Recent Labs Lab 05/11/14 0619 05/12/14 0621 05/13/14 0625  INR 2.58* 2.43* 2.42*    Echocardiogram 05/08/2014: Study Conclusions  - Left ventricle: The cavity size was normal. There was mild concentric hypertrophy.  Systolic function was normal. The estimated ejection fraction was in the range of 50% to 55%. Images were inadequate for LV wall motion assessment. Diastolic dysfunction, indeterminate grade. - Aortic valve: Mildly calcified annulus. Trileaflet; mildly thickened, mildly calcified leaflets. Mild aortic stenosis. Peak velocity 2.28 m/s. Mean gradient 12 mmHg. There was moderate regurgitation. - Mitral valve: Normally functioning bioprosthetic mitral valve. There was no significant regurgitation. - Left atrium: The atrium was moderately dilated. Volume/bsa, S: 38.2 ml/m^2. - Right ventricle: Systolic function was mildly reduced. - Right atrium: The atrium was mildly dilated. - Tricuspid valve: There was mild-moderate regurgitation. - Pulmonary arteries: PA peak pressure: 38 mm Hg (S). Mildly elevated pulmonary pressures.   Medications:   Scheduled Medications: . amiodarone  100 mg Oral Daily  . ciprofloxacin  500 mg Oral QPM  . colchicine  0.3 mg Oral Daily  . ferrous sulfate  325 mg Oral TID WC  . fesoterodine  4 mg Oral Daily  . levothyroxine  50 mcg Oral QAC breakfast  . pantoprazole  40 mg Oral Daily  . potassium chloride SA  40 mEq Oral Daily  . simvastatin  10 mg Oral q1800  . sodium chloride  3 mL Intravenous Q12H  . sodium chloride  3 mL Intravenous Q12H  . torsemide  100 mg Oral  Daily  . Warfarin - Pharmacist Dosing Inpatient   Does not apply Q24H      PRN Medications:  sodium chloride, acetaminophen **OR** acetaminophen, alum & mag hydroxide-simeth, HYDROcodone-acetaminophen, ondansetron **OR** ondansetron (ZOFRAN) IV, polyethylene glycol, senna, sodium chloride, traZODone   Assessment:   1. Acute on chronic diastolic heart failure. Weight is down approximately 8 pounds with IV diuresis, she has developed a contraction alkalosis however. Clinically feels much better, would like to go home.  2. History of atrial fibrillation status post Maze  procedure, maintaining sinus rhythm on amiodarone and Coumadin with therapeutic INR.  3. Essential hypertension, blood pressure is controlled.  4. Bioprosthetic MVR, recent follow-up echocardiogram noted above.  5. CKD, stage 3-4. Creatinine 1.7 up to 1.9.   Plan/Discussion:    Reasonable from a cardiac perspective for discharge home today with close outpatient follow-up. We have scheduled a visit next Monday with Ms. Lawrence NP, she will need to have a BMET at that time. Would recommend discharging on current dose amiodarone, Coumadin, Demadex at 50 mg daily for the next 3 days then resume 100 mg daily, and use Zaroxolyn only when necessary for weight gain of 2-3 pounds in 24 hours.   Satira Sark, M.D., F.A.C.C.

## 2014-05-14 ENCOUNTER — Other Ambulatory Visit: Payer: Self-pay | Admitting: Internal Medicine

## 2014-05-14 DIAGNOSIS — M6281 Muscle weakness (generalized): Secondary | ICD-10-CM | POA: Diagnosis not present

## 2014-05-14 DIAGNOSIS — R0602 Shortness of breath: Secondary | ICD-10-CM | POA: Diagnosis not present

## 2014-05-15 NOTE — Progress Notes (Signed)
UR chart review completed.  

## 2014-05-16 ENCOUNTER — Telehealth: Payer: Self-pay | Admitting: *Deleted

## 2014-05-16 ENCOUNTER — Ambulatory Visit (INDEPENDENT_AMBULATORY_CARE_PROVIDER_SITE_OTHER): Payer: Commercial Managed Care - HMO | Admitting: *Deleted

## 2014-05-16 DIAGNOSIS — I639 Cerebral infarction, unspecified: Secondary | ICD-10-CM

## 2014-05-16 DIAGNOSIS — Z953 Presence of xenogenic heart valve: Secondary | ICD-10-CM | POA: Diagnosis not present

## 2014-05-16 DIAGNOSIS — Z8679 Personal history of other diseases of the circulatory system: Secondary | ICD-10-CM

## 2014-05-16 DIAGNOSIS — I4891 Unspecified atrial fibrillation: Secondary | ICD-10-CM | POA: Diagnosis not present

## 2014-05-16 DIAGNOSIS — Z9889 Other specified postprocedural states: Secondary | ICD-10-CM

## 2014-05-16 DIAGNOSIS — M6281 Muscle weakness (generalized): Secondary | ICD-10-CM | POA: Diagnosis not present

## 2014-05-16 DIAGNOSIS — R0602 Shortness of breath: Secondary | ICD-10-CM | POA: Diagnosis not present

## 2014-05-16 DIAGNOSIS — I5033 Acute on chronic diastolic (congestive) heart failure: Secondary | ICD-10-CM

## 2014-05-16 LAB — POCT INR: INR: 2.7

## 2014-05-16 NOTE — Telephone Encounter (Signed)
See coumadin note. 

## 2014-05-18 ENCOUNTER — Encounter (HOSPITAL_COMMUNITY): Payer: Self-pay | Admitting: Emergency Medicine

## 2014-05-18 ENCOUNTER — Emergency Department (HOSPITAL_COMMUNITY)
Admission: EM | Admit: 2014-05-18 | Discharge: 2014-05-18 | Disposition: A | Discharge status: Home / Self Care | Payer: Commercial Managed Care - HMO | Attending: Emergency Medicine | Admitting: Emergency Medicine

## 2014-05-18 ENCOUNTER — Emergency Department (HOSPITAL_COMMUNITY)
Admission: EM | Admit: 2014-05-18 | Discharge: 2014-05-18 | Disposition: A | Discharge status: Home / Self Care | Payer: Commercial Managed Care - HMO | Source: Home / Self Care | Attending: Emergency Medicine | Admitting: Emergency Medicine

## 2014-05-18 DIAGNOSIS — Z8774 Personal history of (corrected) congenital malformations of heart and circulatory system: Secondary | ICD-10-CM | POA: Insufficient documentation

## 2014-05-18 DIAGNOSIS — I5032 Chronic diastolic (congestive) heart failure: Secondary | ICD-10-CM | POA: Insufficient documentation

## 2014-05-18 DIAGNOSIS — J449 Chronic obstructive pulmonary disease, unspecified: Secondary | ICD-10-CM | POA: Insufficient documentation

## 2014-05-18 DIAGNOSIS — Z8673 Personal history of transient ischemic attack (TIA), and cerebral infarction without residual deficits: Secondary | ICD-10-CM | POA: Diagnosis not present

## 2014-05-18 DIAGNOSIS — N184 Chronic kidney disease, stage 4 (severe): Secondary | ICD-10-CM

## 2014-05-18 DIAGNOSIS — Z79899 Other long term (current) drug therapy: Secondary | ICD-10-CM | POA: Insufficient documentation

## 2014-05-18 DIAGNOSIS — E669 Obesity, unspecified: Secondary | ICD-10-CM | POA: Insufficient documentation

## 2014-05-18 DIAGNOSIS — I480 Paroxysmal atrial fibrillation: Secondary | ICD-10-CM | POA: Insufficient documentation

## 2014-05-18 DIAGNOSIS — R0682 Tachypnea, not elsewhere classified: Secondary | ICD-10-CM | POA: Diagnosis not present

## 2014-05-18 DIAGNOSIS — Z8739 Personal history of other diseases of the musculoskeletal system and connective tissue: Secondary | ICD-10-CM | POA: Diagnosis not present

## 2014-05-18 DIAGNOSIS — E785 Hyperlipidemia, unspecified: Secondary | ICD-10-CM | POA: Insufficient documentation

## 2014-05-18 DIAGNOSIS — Z7901 Long term (current) use of anticoagulants: Secondary | ICD-10-CM

## 2014-05-18 DIAGNOSIS — Z8619 Personal history of other infectious and parasitic diseases: Secondary | ICD-10-CM | POA: Insufficient documentation

## 2014-05-18 DIAGNOSIS — R040 Epistaxis: Secondary | ICD-10-CM | POA: Insufficient documentation

## 2014-05-18 DIAGNOSIS — I129 Hypertensive chronic kidney disease with stage 1 through stage 4 chronic kidney disease, or unspecified chronic kidney disease: Secondary | ICD-10-CM | POA: Insufficient documentation

## 2014-05-18 MED ORDER — ACETAMINOPHEN 325 MG PO TABS: 650.0000 mg | ORAL_TABLET | Freq: Once | ORAL | Status: DC

## 2014-05-18 MED ORDER — ACETAMINOPHEN 500 MG PO TABS
500.0000 mg | ORAL_TABLET | Freq: Once | ORAL | Status: AC
  Administered 2014-05-18: 500 mg via ORAL

## 2014-05-18 MED ORDER — ACETAMINOPHEN 500 MG PO TABS
ORAL_TABLET | ORAL | Status: AC
  Filled 2014-05-18: qty 1

## 2014-05-18 MED ORDER — STERILE WATER FOR INJECTION IJ SOLN
INTRAMUSCULAR | Status: AC
  Filled 2014-05-18: qty 10

## 2014-05-18 MED ORDER — SILVER NITRATE-POT NITRATE 75-25 % EX MISC
CUTANEOUS | Status: AC
  Filled 2014-05-18: qty 1

## 2014-05-18 NOTE — ED Notes (Signed)
Head lamp,rhino rocket, and silver nitrate stick at bedside per EDP request.

## 2014-05-18 NOTE — Discharge Instructions (Signed)
Recommend salt water drops, Neosporin ointment, vaporizer. If bleeding returns, elevate head and apply pressure.

## 2014-05-18 NOTE — ED Notes (Signed)
EDP reported wanted more than one silver nitrate stick at bedside. Only one silver nitrate stick in ER Pyxis. Pharmacy aware and reported would bring dose down STAT.

## 2014-05-18 NOTE — ED Provider Notes (Signed)
CSN: [id number]     Arrival date & time 05/18/14  1815 History  This chart was scribed for Elnora Morrison, MD by Edison Simon, ED Scribe. This patient was seen in room APA01/APA01 and the patient's care was started at 6:22 PM.    Chief Complaint  Patient presents with  . Epistaxis   Patient is a 74 y.o. female presenting with nosebleeds. The history is provided by the patient. No language interpreter was used.  Epistaxis Location:  R nare Severity:  Moderate Duration:  11 hours Timing:  Intermittent Progression since onset: stopped, then reucrred. Chronicity:  Recurrent Context: anticoagulants and home oxygen   Associated symptoms: blood in oropharynx   Associated symptoms: no fever     HPI Comments: [name] [name] is a 74 y.o. female who presents to the Emergency Department complaining of right-sided epistaxis. She was seen here this morning for epistaxis which began at 0700 and bleeding was controlled and time of discharge. Epistaxis recurred later while at home. She uses Coumadin for valve replacement 1 year ago. She uses oxygen but is unsure why. She has her INR checked regularly and states it was measured at 2.7 3 days ago. She notes she was recently hospitalized for CHF. She denies lightheadedness, syncope, fever, chills, or vomiting.  Past Medical History  Diagnosis Date  . COPD (chronic obstructive pulmonary disease)   . History of TIA (transient ischemic attack)     Diagnosed 2003  . History of stroke     2000 [place]  . Obesity   . Juvenile rheumatic fever     Age 40  . Essential hypertension, benign   . Hyperlipidemia   . Mitral regurgitation     Status post bioprosthetic MVR (05/2013)  . CKD (chronic kidney disease) stage 4, GFR 15-29 ml/min   . Paroxysmal atrial fibrillation     s/p MAZE (05/2013)  . Chronic diastolic congestive heart failure     a) ECHO (09/2013) EF 50-55%  . Arthritis   . Aortic insufficiency due to bicuspid aortic valve     Moderate by TEE  .  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 S/P Maze operation for atrial fibrillation 05/16/2013    Complete bilateral atrial lesion set using cryothermy and bipolar radiofrequency ablation with oversewing of LA appendage  . NICM (nonischemic cardiomyopathy)     a) LHC (04/2013) no significant CAD   Past Surgical History  Procedure Laterality Date  . Total knee arthroplasty Right   . Abdominal hysterectomy      Cervical Cancer  . Parathyroid/thyroid surgery      Tumor  . Tee without cardioversion N/A 04/06/2013    Procedure: TRANSESOPHAGEAL ECHOCARDIOGRAM (TEE);  Surgeon: [name], MD;  [facility];  Service: Cardiology;  Laterality: N/A;  . Minimally invasive maze procedure N/A 05/16/2013    Procedure: MINIMALLY INVASIVE MAZE PROCEDURE;  Surgeon: Rexene Alberts, MD;  Location: [place];  Service: Open Heart Surgery;  Laterality: N/A;  . Intraoperative transesophageal echocardiogram N/A 05/16/2013    Procedure: INTRAOPERATIVE TRANSESOPHAGEAL ECHOCARDIOGRAM;  Surgeon: Rexene Alberts, MD;  Location: [place];  Service: Open Heart Surgery;  Laterality: N/A;  . Mitral valve replacement Right 05/16/2013    Procedure: MINIMALLY INVASIVE MITRAL VALVE (MV) REPLACEMENT;  Surgeon: Rexene Alberts, MD;  Location: [place];  Service: Open Heart Surgery;  Laterality: Right;  . Tee without cardioversion N/A 06/06/2013    Procedure: TRANSESOPHAGEAL ECHOCARDIOGRAM (TEE);  Surgeon: Larey Dresser, MD;  Location: [place];  Service: Cardiovascular;  Laterality: N/A;  . Cardioversion N/A 06/06/2013    Procedure: CARDIOVERSION;  Surgeon: Larey Dresser, MD;  Location: [facility] ENDOSCOPY;  Service: Cardiovascular;  Laterality: N/A;  . Left and right heart catheterization with coronary angiogram N/A 05/04/2013    Procedure: LEFT AND RIGHT HEART  CATHETERIZATION WITH CORONARY ANGIOGRAM;  Surgeon: Jettie Booze, MD;  Location: [facility] CATH LAB;  Service: Cardiovascular;  Laterality: N/A;   Family History  Problem Relation Age of Onset  . Heart failure Father   . Heart attack Brother    History  Substance Use Topics  . Smoking status: Never Smoker   . Smokeless tobacco: Never Used  . Alcohol Use: No   OB History    No data available     Review of Systems  Constitutional: Negative for fever and chills.  HENT: Positive for nosebleeds.   Gastrointestinal: Negative for vomiting.  Neurological: Negative for syncope and light-headedness.  All other systems reviewed and are negative.     Allergies  Indomethacin and Norvasc  Home Medications   Prior to Admission medications   Medication Sig Start Date End Date Taking? Authorizing Provider  amiodarone (PACERONE) 200 MG tablet Take 200 mg by mouth daily.   Yes Historical Provider, MD  colchicine 0.6 MG tablet Take 0.5 tablets (0.3 mg total) by mouth daily. 10/11/13  Yes Rande Brunt, NP  ferrous sulfate 325 (65 FE) MG EC tablet Take 325 mg by mouth 3 (three) times daily with meals.   Yes Historical Provider, MD  levothyroxine (SYNTHROID, LEVOTHROID) 50 MCG tablet Take 50 mcg by mouth daily before breakfast.   Yes Historical Provider, MD  omeprazole (PRILOSEC) 20 MG capsule Take 1 capsule (20 mg total) by mouth daily. Medicine to help protect your stomach lining from ulcers while on coumadin 05/13/14  Yes Rexene Alberts, MD  potassium chloride SA (K-DUR,KLOR-CON) 20 MEQ tablet Take 1 tablet (20 mEq total) by mouth daily. 05/30/13  Yes Donielle Liston Alba, PA-C  tolterodine (DETROL LA) 4 MG 24 hr capsule Take 4 mg by mouth daily.   Yes Historical Provider, MD  torsemide (DEMADEX) 100 MG tablet STARTING TOMORROW, TAKE A HALF A TABLET DAILY FOR 3 DAYS AND THEN RESTART ONE TABLET DAILY THEREAFTER. Patient taking differently: Take 100 mg by mouth daily.  05/13/14  Yes Rexene Alberts, MD   warfarin (COUMADIN) 2.5 MG tablet Take 1.25-2.5 mg by mouth daily at 6 PM. Take 1 tab (2.5mg ) on Tues, Thurs, Sat, Sun and take 1/2 tab (1.25mg ) on Mon, Wed, Fri   Yes Historical Provider, MD  amiodarone (PACERONE) 100 MG tablet Take 1 tablet (100 mg total) by mouth daily. Patient not taking: Reported on 05/18/2014 02/26/14   [name], MD  ciprofloxacin (CIPRO) 500 MG tablet Take 1 tablet (500 mg total) by mouth every evening. Antibiotic to be taken for 3 more days. Patient not taking: Reported on 05/18/2014 05/13/14   Rexene Alberts, MD  metolazone (ZAROXOLYN) 2.5 MG tablet Take 1 tablet (2.5 mg total) by mouth as directed. TAKE 1 TABLET AS NEEDED ONLY IF YOUR WEIGHT INCREASES 2-3 POUNDS IN 24 HOURS. 05/13/14   Rexene Alberts, MD  simvastatin (ZOCOR) 10 MG tablet TAKE ONE TABLET BY MOUTH EVERY EVENING. 12/10/13   Tiffany L Reed, DO   BP 125/58 mmHg  Pulse 72  Resp 18  Ht 5\' 2"  (1.575 m)  Wt 164 lb (74.39 kg)  BMI 29.99 kg/m2  SpO2 91% Physical Exam  Constitutional: She is oriented to person, place, and time. She appears well-developed and well-nourished. No distress.  HENT:  Head: Normocephalic and atraumatic.  No gross bleeding Patient has mild bleeding right anterior nasal septal area No active bleeding in left nare  Eyes: Conjunctivae are normal.  Neck: Normal range of motion. Neck supple.  Pulmonary/Chest: Effort normal.  Abdominal: There is no tenderness. There is no rebound.  Musculoskeletal: Normal range of motion.  Neurological: She is alert and oriented to person, place, and time.  Skin: Skin is warm and dry.  Psychiatric: She has a normal mood and affect.  Nursing note and vitals reviewed.   ED Course  Procedures (including critical care time)  Nasal packing Indication epistaxis and Coumadin use Discussed risk and benefits, anterior Rhino Rocket placed with 5 mL of air. No comp occasions patient tolerated well. Bleeding improved. Performed by myself DIAGNOSTIC  STUDIES: Oxygen Saturation is 91% on room air, adequate by my interpretation.    COORDINATION OF CARE: 6:30 PM Discussed treatment plan with patient at beside, the patient agrees with the plan and has no further questions at this time.  8:14 PM Bleeding controlled. Follow up with cardiologist and ENT discussed.  Labs Review Labs Reviewed - No data to display  Imaging Review No results found.   EKG Interpretation None      MDM   Final diagnoses:  Epistaxis, recurrent   I personally performed the services described in this documentation, which was scribed in my presence. The recorded information has been reviewed and is accurate.  Patient presents with recurrent epistaxis. Patient is on blood thinners for heart valve. Patient had mild continuous bleeding and with recurrence decision for packing, anterior Rhino Rocket placed. Patient being observed and be rechecked in 30 minutes. Discussed outpatient follow-up with ENT and cardiologist. Patient recently had INR checked, patient has no lightheadedness or syncope.  Bleeding controlled on recheck Results and differential diagnosis were discussed with the patient/parent/guardian. Close follow up outpatient was discussed, comfortable with the plan.   Medications  sterile water (preservative free) injection (  Canceled Entry 05/18/14 1843)    Filed Vitals:   05/18/14 1814 05/18/14 1818  BP:  125/58  Pulse:  72  Resp:  18  Height: 5\' 2"  (1.575 m)   Weight: 164 lb (74.39 kg)   SpO2:  91%    Final diagnoses:  Epistaxis, recurrent      Elnora Morrison, MD 05/18/14 2027

## 2014-05-18 NOTE — ED Notes (Signed)
Moderate intermittent bleeding noted at this time. Pt sitting up, pressure being applied.

## 2014-05-18 NOTE — ED Notes (Signed)
Pt presents to ED via EMS C/O of nosebleed. Pt states that nosebleed has been present for past hr after bending over, bleeding was continuous dripping. EMS stated that nosebleed has been off and on past few days, admin Afrin en route x1 spray. Pt currently on blood thinners. No active bleeding noted at this time.

## 2014-05-18 NOTE — Discharge Instructions (Signed)
If you were given medicines take as directed.  If you are on coumadin or contraceptives realize their levels and effectiveness is altered by many different medicines.  If you have any reaction (rash, tongues swelling, other) to the medicines stop taking and see a physician.   Follow-up with ENT on Tuesday and cardiology specialist. Please follow up as directed and return to the ER or see a physician for new or worsening symptoms.  Thank you. Filed Vitals:   05/18/14 1814 05/18/14 1818  BP:  125/58  Pulse:  72  Resp:  18  Height: 5\' 2"  (1.575 m)   Weight: 164 lb (74.39 kg)   SpO2:  91%    Nosebleed A nosebleed can be caused by many things, including:  Getting hit hard in the nose.  Infections.  Dry nose.  Colds.  Medicines. Your doctor may do lab testing if you get nosebleeds a lot and the cause is not known. HOME CARE   If your nose was packed with material, keep it there until your doctor takes it out. Put the pack back in your nose if the pack falls out.  Do not blow your nose for 12 hours after the nosebleed.  Sit up and bend forward if your nose starts bleeding again. Pinch the front half of your nose nonstop for 20 minutes.  Put petroleum jelly inside your nose every morning if you have a dry nose.  Use a humidifier to make the air less dry.  Do not take aspirin.  Try not to strain, lift, or bend at the waist for many days after the nosebleed. GET HELP RIGHT AWAY IF:   Nosebleeds keep happening and are hard to stop or control.  You have bleeding or bruises that are not normal on other parts of the body.  You have a fever.  The nosebleeds get worse.  You get lightheaded, feel faint, sweaty, or throw up (vomit) blood. MAKE SURE YOU:   Understand these instructions.  Will watch your condition.  Will get help right away if you are not doing well or get worse. Document Released: 12/02/2007 Document Revised: 05/17/2011 Document Reviewed: 12/02/2007 [facility]  Patient Information 2015 [place], [place]. This information is not intended to replace advice given to you by your health care provider. Make sure you discuss any questions you have with your health care provider.

## 2014-05-18 NOTE — ED Provider Notes (Addendum)
CSN: [id number]     Arrival date & time 05/18/14  0354 History  This chart was scribed for [name], MD by Peyton Bottoms, ED Scribe. This patient was seen in room APA14/APA14 and the patient's care was started at 8:54 AM.   Chief Complaint  Patient presents with  . Epistaxis   Patient is a 75 y.o. female presenting with nosebleeds. The history is provided by the patient. No language interpreter was used.  Epistaxis  HPI Comments: KAMEREN BAADE is a 74 y.o. female with a PMHx of COPD, TIA, stroke, juvenile rheumatic fever, hyperlipidemia, mitral regurgitation, CKD, chronic diastolic CHF, arthritis, and NICM, who presents to the Emergency Department complaining of moderate, constant epistaxis that began at 7:00 AM this morning. Patient states that she takes Coumadin. Patient is on 2L O2 at home.  Past Medical History  Diagnosis Date  . COPD (chronic obstructive pulmonary disease)   . History of TIA (transient ischemic attack)     Diagnosed 2003  . History of stroke     2000 [place]  . Obesity   . Juvenile rheumatic fever     Age 17  . Essential hypertension, benign   . Hyperlipidemia   . Mitral regurgitation     Status post bioprosthetic MVR (05/2013)  . CKD (chronic kidney disease) stage 4, GFR 15-29 ml/min   . Paroxysmal atrial fibrillation     s/p MAZE (05/2013)  . Chronic diastolic congestive heart failure     a) ECHO (09/2013) EF 50-55%  . Arthritis   . Aortic insufficiency due to bicuspid aortic valve     Moderate by TEE  . 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 S/P Maze operation for atrial fibrillation 05/16/2013    Complete bilateral atrial lesion set using cryothermy and bipolar radiofrequency ablation with oversewing of LA appendage  . NICM (nonischemic cardiomyopathy)     a) LHC (04/2013) no significant CAD    Past Surgical History  Procedure Laterality Date  . Total knee arthroplasty Right   . Abdominal hysterectomy      Cervical Cancer  . Parathyroid/thyroid surgery      Tumor  . Tee without cardioversion N/A 04/06/2013    Procedure: TRANSESOPHAGEAL ECHOCARDIOGRAM (TEE);  Surgeon: [name], MD;  [facility];  Service: Cardiology;  Laterality: N/A;  . Minimally invasive maze procedure N/A 05/16/2013    Procedure: MINIMALLY INVASIVE MAZE PROCEDURE;  Surgeon: Rexene Alberts, MD;  Location: [place];  Service: Open Heart Surgery;  Laterality: N/A;  . Intraoperative transesophageal echocardiogram N/A 05/16/2013    Procedure: INTRAOPERATIVE TRANSESOPHAGEAL ECHOCARDIOGRAM;  Surgeon: Rexene Alberts, MD;  Location: [place];  Service: Open Heart Surgery;  Laterality: N/A;  . Mitral valve replacement Right 05/16/2013    Procedure: MINIMALLY INVASIVE MITRAL VALVE (MV) REPLACEMENT;  Surgeon: Rexene Alberts, MD;  Location: [place];  Service: Open Heart Surgery;  Laterality: Right;  . Tee without cardioversion N/A 06/06/2013    Procedure: TRANSESOPHAGEAL ECHOCARDIOGRAM (TEE);  Surgeon: Larey Dresser, MD;  Location: [place];  Service: Cardiovascular;  Laterality: N/A;  . Cardioversion N/A 06/06/2013    Procedure: CARDIOVERSION;  Surgeon: Larey Dresser, MD;  Location: [facility] ENDOSCOPY;  Service: Cardiovascular;  Laterality: N/A;  . Left and right heart catheterization with coronary angiogram N/A 05/04/2013    Procedure: LEFT AND RIGHT HEART  CATHETERIZATION WITH CORONARY ANGIOGRAM;  Surgeon: Jettie Booze, MD;  Location: [facility] CATH LAB;  Service: Cardiovascular;  Laterality: N/A;   Family History  Problem Relation Age of Onset  . Heart failure Father   . Heart attack Brother    History  Substance Use Topics  . Smoking status: Never Smoker   . Smokeless tobacco: Never Used  . Alcohol Use: No   OB History    No data available     Review of Systems  HENT: Positive for nosebleeds.     A complete 10 system review of systems was obtained and all systems are negative except as noted in the HPI and PMH.    Allergies  Indomethacin and Norvasc  Home Medications   Prior to Admission medications   Medication Sig Start Date End Date Taking? Authorizing Provider  amiodarone (PACERONE) 100 MG tablet Take 1 tablet (100 mg total) by mouth daily. 02/26/14   [name], MD  ciprofloxacin (CIPRO) 500 MG tablet Take 1 tablet (500 mg total) by mouth every evening. Antibiotic to be taken for 3 more days. 05/13/14   Rexene Alberts, MD  colchicine 0.6 MG tablet Take 0.5 tablets (0.3 mg total) by mouth daily. 10/11/13   Rande Brunt, NP  ferrous sulfate 325 (65 FE) MG EC tablet Take 325 mg by mouth 3 (three) times daily with meals.    Historical Provider, MD  levothyroxine (SYNTHROID, LEVOTHROID) 50 MCG tablet Take 50 mcg by mouth daily before breakfast.    Historical Provider, MD  metolazone (ZAROXOLYN) 2.5 MG tablet Take 1 tablet (2.5 mg total) by mouth as directed. TAKE 1 TABLET AS NEEDED ONLY IF YOUR WEIGHT INCREASES 2-3 POUNDS IN 24 HOURS. 05/13/14   Rexene Alberts, MD  omeprazole (PRILOSEC) 20 MG capsule Take 1 capsule (20 mg total) by mouth daily. Medicine to help protect your stomach lining from ulcers while on coumadin 05/13/14   Rexene Alberts, MD  potassium chloride SA (K-DUR,KLOR-CON) 20 MEQ tablet Take 1 tablet (20 mEq total) by mouth daily. 05/30/13   Donielle Liston Alba, PA-C  simvastatin (ZOCOR) 10 MG tablet TAKE ONE TABLET BY MOUTH EVERY EVENING. 12/10/13   Tiffany L Reed, DO  tolterodine (DETROL LA) 4 MG 24 hr capsule Take 4 mg by mouth daily.    Historical Provider, MD  torsemide (DEMADEX) 100 MG tablet STARTING TOMORROW, TAKE A HALF A TABLET DAILY FOR 3 DAYS AND THEN RESTART ONE TABLET DAILY THEREAFTER. 05/13/14   Rexene Alberts, MD  warfarin (COUMADIN) 2.5 MG tablet Take 1.25-2.5 mg by mouth daily at 6 PM. Take 1 tab (2.5mg ) on Tues, Thurs, Sat, Sun and take 1/2 tab (1.25mg ) on  Mon, Wed, Fri    Historical Provider, MD   Triage Vitals: BP 130/57 mmHg  Pulse 60  Ht 5\' 2"  (1.575 m)  Wt 164 lb (74.39 kg)  BMI 29.99 kg/m2  SpO2 97%  Physical Exam  Constitutional: She is oriented to person, place, and time. She appears well-developed and well-nourished.  HENT:  Head: Normocephalic and atraumatic.  Eyes: Conjunctivae and EOM are normal. Pupils are equal, round, and reactive to light.  Neck: Normal range of motion. Neck supple.  Cardiovascular: Normal rate and regular rhythm.   Pulmonary/Chest: Effort normal and breath sounds normal.  Abdominal: Soft. Bowel sounds are normal.  Musculoskeletal: Normal range of motion.  Neurological: She is alert and oriented to person, place, and time.  Skin: Skin is warm and dry.  Psychiatric: She has a normal  mood and affect. Her behavior is normal.  Nursing note and vitals reviewed.  ED Course  EPISTAXIS MANAGEMENT Date/Time: 05/18/2014 11:23 AM Performed by: [name] Authorized by: [name] Consent: Verbal consent obtained. Risks and benefits: risks, benefits and alternatives were discussed Consent given by: patient Patient understanding: patient states understanding of the procedure being performed Patient identity confirmed: verbally with patient Time out: Immediately prior to procedure a "time out" was called to verify the correct patient, procedure, equipment, support staff and site/side marked as required. Patient sedated: no Treatment site: right anterior Post-procedure assessment: bleeding stopped Treatment complexity: simple Patient tolerance: Patient tolerated the procedure well with no immediate complications Comments: Both naris examined with headlight. No bleeding on left side. Right nares shows clotted blood approximately 1 cm medially in nostril   (including critical care time)  DIAGNOSTIC STUDIES: Oxygen Saturation is 97% on [place], normal by my interpretation.    COORDINATION OF CARE: 8:58 AM-  Discussed plans to assess for bleeding.. Pt advised of plan for treatment and pt agrees.  Labs Review Labs Reviewed - No data to display  Imaging Review No results found.   EKG Interpretation None     MDM   Final diagnoses:  Right-sided epistaxis    Good hemostasis in right nares. No packing required. Recommended vaporizer, saline drops, Neosporin ointment  I personally performed the services described in this documentation, which was scribed in my presence. The recorded information has been reviewed and is accurate.   [name], MD 05/18/14 1128  [name], MD 05/29/14 1400

## 2014-05-18 NOTE — ED Notes (Signed)
No bleeding from nose at this time 

## 2014-05-18 NOTE — ED Notes (Signed)
Pt c/o intermittent epistaxis since Wednesday. Pt seen in ED for same this am and bleeding was controlled upon d/c. Pt given blow by O2 and Afrin to right nare in route to ED. EMS reports pt coughed up several clots at home and one large blood clot in route. Pt also c/o nausea. Pt on coumadin. Bleeding controlled at present.

## 2014-05-18 NOTE — ED Notes (Signed)
EDP at bedside  

## 2014-05-20 ENCOUNTER — Ambulatory Visit (INDEPENDENT_AMBULATORY_CARE_PROVIDER_SITE_OTHER): Payer: Commercial Managed Care - HMO | Admitting: Adult Health

## 2014-05-20 ENCOUNTER — Encounter: Payer: Self-pay | Admitting: Adult Health

## 2014-05-20 VITALS — BP 122/68 | HR 77 | Ht 62.0 in | Wt 170.4 lb

## 2014-05-20 DIAGNOSIS — I10 Essential (primary) hypertension: Secondary | ICD-10-CM | POA: Diagnosis not present

## 2014-05-20 DIAGNOSIS — M6281 Muscle weakness (generalized): Secondary | ICD-10-CM | POA: Diagnosis not present

## 2014-05-20 DIAGNOSIS — I480 Paroxysmal atrial fibrillation: Secondary | ICD-10-CM

## 2014-05-20 DIAGNOSIS — R0602 Shortness of breath: Secondary | ICD-10-CM | POA: Diagnosis not present

## 2014-05-20 DIAGNOSIS — I5032 Chronic diastolic (congestive) heart failure: Secondary | ICD-10-CM | POA: Diagnosis not present

## 2014-05-20 NOTE — Progress Notes (Deleted)
Name: [name] [name]    DOB: 18-Apr-1940  Age: 74 y.o.  MR#: [id number]       PCP:  [name], MD      Insurance: Payor: [employer] Rossetti MEDICARE / Plan: [place] THN/NTSP / Product Type: *No Product type* /   CC:    Chief Complaint  Patient presents with  . Congestive Heart Failure    Mixed  . Atrial Fibrillation    PAF    VS Filed Vitals:   05/20/14 1428  BP: 122/68  Pulse: 77  Height: 5\' 2"  (1.575 m)  Weight: 170 lb 6.4 oz (77.293 kg)  SpO2: 95%    Weights Current Weight  05/20/14 170 lb 6.4 oz (77.293 kg)  05/18/14 164 lb (74.39 kg)  05/18/14 164 lb (74.39 kg)    Blood Pressure  BP Readings from Last 3 Encounters:  05/20/14 122/68  05/18/14 106/44  05/18/14 116/59     Admit date:  (Not on file) Last encounter with RMR:  Visit date not found   Allergy Indomethacin and Norvasc  Current Outpatient Prescriptions  Medication Sig Dispense Refill  . amiodarone (PACERONE) 100 MG tablet Take 1 tablet (100 mg total) by mouth daily. (Patient not taking: Reported on 05/18/2014) 30 tablet 3  . amiodarone (PACERONE) 200 MG tablet Take 200 mg by mouth daily.    . ciprofloxacin (CIPRO) 500 MG tablet Take 1 tablet (500 mg total) by mouth every evening. Antibiotic to be taken for 3 more days. (Patient not taking: Reported on 05/18/2014) 3 tablet 0  . colchicine 0.6 MG tablet Take 0.5 tablets (0.3 mg total) by mouth daily. 30 tablet 0  . ferrous sulfate 325 (65 FE) MG EC tablet Take 325 mg by mouth 3 (three) times daily with meals.    Marland Kitchen levothyroxine (SYNTHROID, LEVOTHROID) 50 MCG tablet Take 50 mcg by mouth daily before breakfast.    . metolazone (ZAROXOLYN) 2.5 MG tablet Take 1 tablet (2.5 mg total) by mouth as directed. TAKE 1 TABLET AS NEEDED ONLY IF YOUR WEIGHT INCREASES 2-3 POUNDS IN 24 HOURS.    Marland Kitchen omeprazole (PRILOSEC) 20 MG capsule Take 1 capsule (20 mg total) by mouth daily. Medicine to help protect your stomach lining from ulcers while on coumadin 30 capsule 3   . potassium chloride SA (K-DUR,KLOR-CON) 20 MEQ tablet Take 1 tablet (20 mEq total) by mouth daily. 60 tablet 6  . simvastatin (ZOCOR) 10 MG tablet TAKE ONE TABLET BY MOUTH EVERY EVENING. 30 tablet 3  . tolterodine (DETROL LA) 4 MG 24 hr capsule Take 4 mg by mouth daily.    Marland Kitchen torsemide (DEMADEX) 100 MG tablet STARTING TOMORROW, TAKE A HALF A TABLET DAILY FOR 3 DAYS AND THEN RESTART ONE TABLET DAILY THEREAFTER. (Patient taking differently: Take 100 mg by mouth daily. )    . warfarin (COUMADIN) 2.5 MG tablet Take 1.25-2.5 mg by mouth daily at 6 PM. Take 1 tab (2.5mg ) on Tues, Thurs, Sat, Sun and take 1/2 tab (1.25mg ) on Mon, Wed, Fri     No current facility-administered medications for this visit.    Discontinued Meds:   There are no discontinued medications.  Patient Active Problem List   Diagnosis Date Noted  . Anemia, iron deficiency 05/09/2014  . UTI (urinary tract infection) 05/09/2014  . Obesity 05/08/2014  . Elevated troponin 05/08/2014  . Hypothyroidism 05/08/2014  . Acute respiratory failure with hypoxia 11/14/2013  . Fx lumbar vertebra-closed 11/12/2013  . Lumbar vertebral fracture 11/11/2013  .  Fall at home 11/11/2013  . DNR (do not resuscitate) 10/23/2013  . Anemia in chronic kidney disease 10/20/2013  . Acute on chronic diastolic heart failure 62/37/6283  . Streptococcal bacteremia 10/08/2013  . Congenital bicuspid aortic valve 10/08/2013  . Acute on chronic systolic heart failure 15/17/6160  . Acute on chronic renal failure 10/03/2013  . Long term (current) use of anticoagulants 06/22/2013  . Chronic systolic heart failure 73/71/0626  . S/P MVR (mitral valve replacement) 06/04/2013  . CVA (cerebral infarction) 05/21/2013  . Acute pulmonary edema 05/18/2013  . 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 S/P Maze operation for atrial fibrillation 05/16/2013  . Aortic insufficiency due to bicuspid aortic  valve   . Chronic kidney disease   . Paroxysmal atrial fibrillation   . Chronic diastolic congestive heart failure   . Arthritis   . Acute respiratory failure 05/02/2013  . Atrial fibrillation with RVR 04/29/2013  . Shortness of breath   . Juvenile rheumatic fever   . Mitral regurgitation 02/16/2013  . HTN (hypertension) 02/15/2011    LABS    Component Value Date/Time   NA 140 05/13/2014 0625   NA 138 05/12/2014 0621   NA 139 05/11/2014 0619   K 3.9 05/13/2014 0625   K 3.4* 05/12/2014 0621   K 3.6 05/11/2014 0619   CL 93* 05/13/2014 0625   CL 93* 05/12/2014 0621   CL 96 05/11/2014 0619   CO2 40* 05/13/2014 0625   CO2 39* 05/12/2014 0621   CO2 36* 05/11/2014 0619   GLUCOSE 96 05/13/2014 0625   GLUCOSE 92 05/12/2014 0621   GLUCOSE 88 05/11/2014 0619   BUN 27* 05/13/2014 0625   BUN 24* 05/12/2014 0621   BUN 21 05/11/2014 0619   CREATININE 1.93* 05/13/2014 0625   CREATININE 1.79* 05/12/2014 0621   CREATININE 1.79* 05/11/2014 0619   CALCIUM 8.8 05/13/2014 0625   CALCIUM 8.6 05/12/2014 0621   CALCIUM 8.8 05/11/2014 0619   GFRNONAA 25* 05/13/2014 0625   GFRNONAA 27* 05/12/2014 0621   GFRNONAA 27* 05/11/2014 0619   GFRAA 29* 05/13/2014 0625   GFRAA 31* 05/12/2014 0621   GFRAA 31* 05/11/2014 0619   CMP     Component Value Date/Time   NA 140 05/13/2014 0625   K 3.9 05/13/2014 0625   CL 93* 05/13/2014 0625   CO2 40* 05/13/2014 0625   GLUCOSE 96 05/13/2014 0625   BUN 27* 05/13/2014 0625   CREATININE 1.93* 05/13/2014 0625   CALCIUM 8.8 05/13/2014 0625   PROT 7.1 02/20/2014 0951   ALBUMIN 3.0* 02/20/2014 0951   AST 23 02/20/2014 0951   ALT 11 02/20/2014 0951   ALKPHOS 130* 02/20/2014 0951   BILITOT 0.5 02/20/2014 0951   GFRNONAA 25* 05/13/2014 0625   GFRAA 29* 05/13/2014 0625       Component Value Date/Time   WBC 3.1* 05/13/2014 0625   WBC 3.9* 05/11/2014 0619   WBC 4.0 05/10/2014 0651   HGB 8.8* 05/13/2014 0625   HGB 9.1* 05/11/2014 0619   HGB 8.9*  05/10/2014 0651   HCT 29.8* 05/13/2014 0625   HCT 29.9* 05/11/2014 0619   HCT 29.1* 05/10/2014 0651   MCV 95.2 05/13/2014 0625   MCV 93.7 05/11/2014 0619   MCV 93.9 05/10/2014 0651    Lipid Panel     Component Value Date/Time   CHOL 89 05/23/2013 0240   TRIG 134 05/23/2013 0240   HDL 27* 05/23/2013 0240   CHOLHDL 3.3 05/23/2013  0240   VLDL 27 05/23/2013 0240   LDLCALC 35 05/23/2013 0240    ABG    Component Value Date/Time   PHART 7.410 05/13/2014 0820   PCO2ART 63.3* 05/13/2014 0820   PO2ART 80.1 05/13/2014 0820   HCO3 39.4* 05/13/2014 0820   TCO2 35.4 05/13/2014 0820   ACIDBASEDEF 2.0 05/17/2013 1626   O2SAT 94.9 05/13/2014 0820     Lab Results  Component Value Date   TSH 3.508 05/08/2014   BNP (last 3 results) No results for input(s): BNP in the last 8760 hours.  ProBNP (last 3 results)  Recent Labs  10/23/13 0806 11/29/13 0950 02/15/14 1040  PROBNP 1867.0* 2233.0* 2802.0*    Cardiac Panel (last 3 results) No results for input(s): CKTOTAL, CKMB, TROPONINI, RELINDX in the last 72 hours.  Iron/TIBC/Ferritin/ %Sat    Component Value Date/Time   IRON 31* 05/09/2014 0547   TIBC 287 05/09/2014 0547   FERRITIN 29 05/09/2014 0547   IRONPCTSAT 11* 05/09/2014 0547     EKG Orders placed or performed during the hospital encounter of 05/08/14  . EKG 12-Lead  . EKG 12-Lead  . EKG     Prior Assessment and Plan Problem List as of 05/20/2014      Cardiovascular and Mediastinum   HTN (hypertension)   Last Assessment & Plan 06/28/2013 Office Visit Written 06/28/2013  3:57 PM by Lendon Colonel, NP    Early well-controlled at 125/55. No changes are made on her medication regimen at this time but will have one month followup with close evaluation of blood pressure control as an outpatient. He currently resides at the [facility] in a controlled environment. Will be much easier to titrate medications when she returns home knowing what her blood pressure is like  there.      Mitral regurgitation   Paroxysmal atrial fibrillation   Chronic diastolic congestive heart failure   Aortic insufficiency due to bicuspid aortic valve   Atrial fibrillation with RVR   Chronic systolic heart failure   Last Assessment & Plan 06/28/2013 Office Visit Written 06/28/2013  3:56 PM by Lendon Colonel, NP    The patient is done well from our standpoint. She does not have any issues with fluid retention. Her weight is essentially stable at iron 86 pounds. She is being provided medication [place]. But is due to return to her normal home environment in the morning. She will continue carvedilol as directed, along with torsemide 80 mg daily and 25 mg a spinal lactone. Would not make any other medicine changes at this time but will consider adding hydralazine on followup visit when she is in her home environment. She is advised a low-sodium diet, and not to be eating foods that are processed, canned or fast food. She may need to be considered for [facility] services when she returns home as well. She will followup with Dr. Harl Bowie in the office as this is where she resides any disease year for her to come to that office. She will not be placed on ACE secondary to chronic kidney disease      Acute on chronic systolic heart failure   Congenital bicuspid aortic valve   Acute on chronic diastolic heart failure     Respiratory   Acute respiratory failure   Acute pulmonary edema   Acute respiratory failure with hypoxia     Endocrine   Hypothyroidism     Nervous and Auditory   CVA (cerebral infarction)  Musculoskeletal and Integument   Arthritis   Lumbar vertebral fracture   Fx lumbar vertebra-closed     Genitourinary   Chronic kidney disease   Acute on chronic renal failure   Anemia in chronic kidney disease   UTI (urinary tract infection)     Other   Shortness of breath   Last Assessment & Plan 06/28/2013 Office Visit Written 06/28/2013  3:57 PM by  Lendon Colonel, NP    Essentially the same. She is not very active. She uses a walker, and is also the wheelchair today. She continues with physical rehabilitation. There is some deconditioning elements to this.      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 Halford Chessman within the next 2 weeks. She has not felt any racing of her heart, she is not having any chest pain or shortness.      S/P MVR (mitral valve replacement)   Long term (current) use of anticoagulants   Streptococcal bacteremia   DNR (do not resuscitate)   Fall at home   Obesity   Elevated troponin   Anemia, iron deficiency       Imaging: Dg Chest Port 1 View  05/08/2014   CLINICAL DATA:  Short of breath per 2 day  EXAM: PORTABLE CHEST - 1 VIEW  COMPARISON:  02/15/2014  FINDINGS: Severe cardiomegaly. Low lung volumes. Diffuse pulmonary edema. No pneumothorax.  IMPRESSION: Moderate CHF with pulmonary edema.  Worsening since the prior study.   Electronically Signed   By: Marybelle Killings M.D.   On: 05/08/2014 08:24

## 2014-05-20 NOTE — Progress Notes (Signed)
Cardiology Office Note   Date:  05/20/2014   ID:  ALFREDO COLLYMORE, DOB 1940/06/05, MRN [id number]  PCP:  [name], MD  Cardiologist:  Cloria Spring, NP   Chief Complaint  Patient presents with  . Congestive Heart Failure    Mixed  . Atrial Fibrillation    PAF      History of Present Illness: [name] [name] is a 74 y.o. female who presents for followup after admission, CHF, chronic diastolic heart failure, prominent RV failure, EF of 50-55%, PAF, status post maze and mitral valve repair in March of 2015, remains on amiodarone and Coumadin.  The patient was discharged from the hospital on 05/13/2014 in the setting of acute on chronic diastolic heart failure, secondary to dietary indiscretion.  The patient was diuresed 7.4 L, with a discharge, weight of 172 pounds.   She did have some elevation in troponin, which is related to demand ischemia.  She was advised on a low sodium diet.  Echocardiogram revealed a 5055%.  LVEF, mildly calcified aortic valve, normal functioning prosthetic mitral valve, but the atrium was moderately dilated.  Right ventricle systolic function was mildly reduced.  PA pressure was 38 mm mercury.  She is doing very well. Adhering to low sodium diet. She has HHN see her for weights, vital signs, and PT/INR. She has been to ER for epistaxis twice since discharge. She has nasal packing placed on the right and is being seen by ENT.  Past Medical History  Diagnosis Date  . COPD (chronic obstructive pulmonary disease)   . History of TIA (transient ischemic attack)     Diagnosed 2003  . History of stroke     2000 [place]  . Obesity   . Juvenile rheumatic fever     Age 41  . Essential hypertension, benign   . Hyperlipidemia   . Mitral regurgitation     Status post bioprosthetic MVR (05/2013)  . CKD (chronic kidney disease) stage 4, GFR 15-29 ml/min   . Paroxysmal atrial fibrillation     s/p MAZE (05/2013)  . Chronic diastolic congestive heart  failure     a) ECHO (09/2013) EF 50-55%  . Arthritis   . Aortic insufficiency due to bicuspid aortic valve     Moderate by TEE  . 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 S/P Maze operation for atrial fibrillation 05/16/2013    Complete bilateral atrial lesion set using cryothermy and bipolar radiofrequency ablation with oversewing of LA appendage  . NICM (nonischemic cardiomyopathy)     a) LHC (04/2013) no significant CAD    Past Surgical History  Procedure Laterality Date  . Total knee arthroplasty Right   . Abdominal hysterectomy      Cervical Cancer  . Parathyroid/thyroid surgery      Tumor  . Tee without cardioversion N/A 04/06/2013    Procedure: TRANSESOPHAGEAL ECHOCARDIOGRAM (TEE);  Surgeon: [name], MD;  [facility];  Service: Cardiology;  Laterality: N/A;  . Minimally invasive maze procedure N/A 05/16/2013    Procedure: MINIMALLY INVASIVE MAZE PROCEDURE;  Surgeon: Rexene Alberts, MD;  Location: [place];  Service: Open Heart Surgery;  Laterality: N/A;  . Intraoperative transesophageal echocardiogram N/A 05/16/2013    Procedure: INTRAOPERATIVE TRANSESOPHAGEAL ECHOCARDIOGRAM;  Surgeon: Rexene Alberts, MD;  Location: [facility];  Service: Open Heart Surgery;  Laterality:  N/A;  . Mitral valve replacement Right 05/16/2013    Procedure: MINIMALLY INVASIVE MITRAL VALVE (MV) REPLACEMENT;  Surgeon: Rexene Alberts, MD;  Location: [facility];  Service: Open Heart Surgery;  Laterality: Right;  . Tee without cardioversion N/A 06/06/2013    Procedure: TRANSESOPHAGEAL ECHOCARDIOGRAM (TEE);  Surgeon: Larey Dresser, MD;  Location: [facility];  Service: Cardiovascular;  Laterality: N/A;  . Cardioversion N/A 06/06/2013    Procedure: CARDIOVERSION;  Surgeon: Larey Dresser, MD;  Location: [facility] ENDOSCOPY;  Service: Cardiovascular;   Laterality: N/A;  . Left and right heart catheterization with coronary angiogram N/A 05/04/2013    Procedure: LEFT AND RIGHT HEART CATHETERIZATION WITH CORONARY ANGIOGRAM;  Surgeon: Jettie Booze, MD;  Location: [facility] CATH LAB;  Service: Cardiovascular;  Laterality: N/A;     Current Outpatient Prescriptions  Medication Sig Dispense Refill  . amiodarone (PACERONE) 100 MG tablet Take 1 tablet (100 mg total) by mouth daily. (Patient not taking: Reported on 05/18/2014) 30 tablet 3  . amiodarone (PACERONE) 200 MG tablet Take 200 mg by mouth daily.    . ciprofloxacin (CIPRO) 500 MG tablet Take 1 tablet (500 mg total) by mouth every evening. Antibiotic to be taken for 3 more days. (Patient not taking: Reported on 05/18/2014) 3 tablet 0  . colchicine 0.6 MG tablet Take 0.5 tablets (0.3 mg total) by mouth daily. 30 tablet 0  . ferrous sulfate 325 (65 FE) MG EC tablet Take 325 mg by mouth 3 (three) times daily with meals.    Marland Kitchen levothyroxine (SYNTHROID, LEVOTHROID) 50 MCG tablet Take 50 mcg by mouth daily before breakfast.    . metolazone (ZAROXOLYN) 2.5 MG tablet Take 1 tablet (2.5 mg total) by mouth as directed. TAKE 1 TABLET AS NEEDED ONLY IF YOUR WEIGHT INCREASES 2-3 POUNDS IN 24 HOURS.    Marland Kitchen omeprazole (PRILOSEC) 20 MG capsule Take 1 capsule (20 mg total) by mouth daily. Medicine to help protect your stomach lining from ulcers while on coumadin 30 capsule 3  . potassium chloride SA (K-DUR,KLOR-CON) 20 MEQ tablet Take 1 tablet (20 mEq total) by mouth daily. 60 tablet 6  . simvastatin (ZOCOR) 10 MG tablet TAKE ONE TABLET BY MOUTH EVERY EVENING. 30 tablet 3  . tolterodine (DETROL LA) 4 MG 24 hr capsule Take 4 mg by mouth daily.    Marland Kitchen torsemide (DEMADEX) 100 MG tablet STARTING TOMORROW, TAKE A HALF A TABLET DAILY FOR 3 DAYS AND THEN RESTART ONE TABLET DAILY THEREAFTER. (Patient taking differently: Take 100 mg by mouth daily. )    . warfarin (COUMADIN) 2.5 MG tablet Take 1.25-2.5 mg by mouth daily at 6 PM.  Take 1 tab (2.5mg ) on Tues, Thurs, Sat, Sun and take 1/2 tab (1.25mg ) on Mon, Wed, Fri     No current facility-administered medications for this visit.    Allergies:   Indomethacin and Norvasc    Social History:  The patient  reports that she has never smoked. She has never used smokeless tobacco. She reports that she does not drink alcohol or use illicit drugs.   Family History:  The patient's family history includes Heart attack in her brother; Heart failure in her father.    ROS: .   All other systems are reviewed and negative.Unless otherwise mentioned in H&P above.   PHYSICAL EXAM: VS:  BP 122/68 mmHg  Pulse 77  Ht 5\' 2"  (1.575 m)  Wt 170 lb 6.4 oz (77.293 kg)  BMI 31.16 kg/m2  SpO2 95% , BMI Body  mass index is 31.16 kg/(m^2). GEN: Well nourished, well developed, in no acute distress HEENT: normalNasal packing on the right.  Neck: no JVD, carotid bruits, or masses Cardiac:RRR; no murmurs, rubs, or gallops,no edema  Respiratory:  clear to auscultation bilaterally, normal work of breathing GI: soft, nontender, nondistended, + BS MS: no deformity or atrophyNo edema but venous stasis skin thickening is noted.  Skin: warm and dry, no rash Neuro:  Strength and sensation are intact Psych: euthymic mood, full affect   EKG:   The ekg ordered today demonstrates NSR with Incomplete LBBB, Lateral T-wave abnormality.    Recent Labs: 11/13/2013: Magnesium 2.2 02/15/2014: Pro B Natriuretic peptide (BNP) 2802.0* 02/20/2014: ALT 11 05/08/2014: TSH 3.508 05/13/2014: BUN 27*; Creatinine 1.93*; Hemoglobin 8.8*; Platelets 161; Potassium 3.9; Sodium 140    Lipid Panel    Component Value Date/Time   CHOL 89 05/23/2013 0240   TRIG 134 05/23/2013 0240   HDL 27* 05/23/2013 0240   CHOLHDL 3.3 05/23/2013 0240   VLDL 27 05/23/2013 0240   LDLCALC 35 05/23/2013 0240      Wt Readings from Last 3 Encounters:  05/20/14 170 lb 6.4 oz (77.293 kg)  05/18/14 164 lb (74.39 kg)  05/18/14 164 lb  (74.39 kg)     ASSESSMENT AND PLAN:  1. Atrial fib with RVR: Heart rate is well controlled currently. She is having epistaxis from coumadin. She is on amiodarone, and cipro currently. Finishing up cipro this week. Uncertain of dose of amiodarone, as list shows both 200 mg daily and 100 mg daily. She will have her grand daughter call us with dose. Will see her in 1 month. She is normally seen in the [place] office and wishes to be followed there.   2. Diastolic CHF: Weight is currently well controlled. States it is 3 lbs less at home. She is [facility] by [facility] for weights as well. She will continue current medications and will be seen in 1 month. Will hear from [facility] if she has weight gain or symptoms.   Current medicines are reviewed at length with the patient today.    Orders Placed This Encounter  Procedures  . Basic Metabolic Panel (BMET)     Disposition:   FU with 1 month   Signed, Jory Sims, NP  05/20/2014 3:20 PM    [place]. [address] [place], [place] [address] Phone: [phone]; Fax: [phone]

## 2014-05-20 NOTE — Patient Instructions (Signed)
Your physician recommends that you schedule a follow-up appointment in: 4-6 weeks with Dr.Branch  Your physician recommends that you continue on your current medications as directed. Please refer to the Current Medication list given to you today.  Your physician recommends that you return for lab work in: [place])  Thank you for choosing [facility]!

## 2014-05-21 DIAGNOSIS — R0602 Shortness of breath: Secondary | ICD-10-CM | POA: Diagnosis not present

## 2014-05-21 DIAGNOSIS — M6281 Muscle weakness (generalized): Secondary | ICD-10-CM | POA: Diagnosis not present

## 2014-05-21 NOTE — Addendum Note (Signed)
Addended by: Levonne Hubert on: 05/21/2014 03:45 PM   Modules accepted: Orders

## 2014-05-22 DIAGNOSIS — I509 Heart failure, unspecified: Secondary | ICD-10-CM | POA: Diagnosis not present

## 2014-05-22 DIAGNOSIS — M6281 Muscle weakness (generalized): Secondary | ICD-10-CM | POA: Diagnosis not present

## 2014-05-22 DIAGNOSIS — R0602 Shortness of breath: Secondary | ICD-10-CM | POA: Diagnosis not present

## 2014-05-23 ENCOUNTER — Ambulatory Visit (INDEPENDENT_AMBULATORY_CARE_PROVIDER_SITE_OTHER): Payer: Commercial Managed Care - HMO | Admitting: Otolaryngology

## 2014-05-23 ENCOUNTER — Ambulatory Visit (INDEPENDENT_AMBULATORY_CARE_PROVIDER_SITE_OTHER): Payer: Commercial Managed Care - HMO | Admitting: *Deleted

## 2014-05-23 ENCOUNTER — Telehealth: Payer: Self-pay | Admitting: *Deleted

## 2014-05-23 DIAGNOSIS — Z953 Presence of xenogenic heart valve: Secondary | ICD-10-CM

## 2014-05-23 DIAGNOSIS — Z9889 Other specified postprocedural states: Secondary | ICD-10-CM

## 2014-05-23 DIAGNOSIS — R040 Epistaxis: Secondary | ICD-10-CM | POA: Diagnosis not present

## 2014-05-23 DIAGNOSIS — H903 Sensorineural hearing loss, bilateral: Secondary | ICD-10-CM | POA: Diagnosis not present

## 2014-05-23 DIAGNOSIS — Z8679 Personal history of other diseases of the circulatory system: Secondary | ICD-10-CM

## 2014-05-23 DIAGNOSIS — I5033 Acute on chronic diastolic (congestive) heart failure: Secondary | ICD-10-CM

## 2014-05-23 DIAGNOSIS — I4891 Unspecified atrial fibrillation: Secondary | ICD-10-CM

## 2014-05-23 DIAGNOSIS — M6281 Muscle weakness (generalized): Secondary | ICD-10-CM | POA: Diagnosis not present

## 2014-05-23 DIAGNOSIS — R0602 Shortness of breath: Secondary | ICD-10-CM | POA: Diagnosis not present

## 2014-05-23 DIAGNOSIS — I639 Cerebral infarction, unspecified: Secondary | ICD-10-CM

## 2014-05-23 LAB — POCT INR: INR: 3.4

## 2014-05-23 NOTE — Telephone Encounter (Signed)
PT 40.7 INR 3.4  patient had several nose bleeds over the weekend and went to hospital

## 2014-05-23 NOTE — Telephone Encounter (Signed)
See coumadin note. 

## 2014-05-27 ENCOUNTER — Other Ambulatory Visit: Payer: Self-pay | Admitting: Internal Medicine

## 2014-05-28 ENCOUNTER — Other Ambulatory Visit: Payer: Self-pay | Admitting: *Deleted

## 2014-05-28 ENCOUNTER — Encounter: Payer: Self-pay | Admitting: *Deleted

## 2014-05-28 NOTE — Patient Outreach (Signed)
05/28/2014  [name] [name] 03/01/1941 [id number]  [name] [name] is an 74 y.o. female  Subjective:  Telephone call to pt for transition of care week 3,  HIPAA verified, pt reports she is doing well, weighing daily, wt. Today 169.8, has slight lower extremity edema, dyspnea with exertion at times, has all medications, home health nurse continues to see pt. No new issues or concerns voiced.   ROS  Physical Exam  Current Medications: Current Outpatient Prescriptions  Medication Sig Dispense Refill  . amiodarone (PACERONE) 100 MG tablet Take 1 tablet (100 mg total) by mouth daily. (Patient not taking: Reported on 05/18/2014) 30 tablet 3  . amiodarone (PACERONE) 200 MG tablet Take 200 mg by mouth daily.    . ciprofloxacin (CIPRO) 500 MG tablet Take 1 tablet (500 mg total) by mouth every evening. Antibiotic to be taken for 3 more days. (Patient not taking: Reported on 05/18/2014) 3 tablet 0  . colchicine 0.6 MG tablet Take 0.5 tablets (0.3 mg total) by mouth daily. 30 tablet 0  . ferrous sulfate 325 (65 FE) MG EC tablet Take 325 mg by mouth 3 (three) times daily with meals.    Marland Kitchen levothyroxine (SYNTHROID, LEVOTHROID) 50 MCG tablet Take 50 mcg by mouth daily before breakfast.    . metolazone (ZAROXOLYN) 2.5 MG tablet Take 1 tablet (2.5 mg total) by mouth as directed. TAKE 1 TABLET AS NEEDED ONLY IF YOUR WEIGHT INCREASES 2-3 POUNDS IN 24 HOURS.    Marland Kitchen omeprazole (PRILOSEC) 20 MG capsule Take 1 capsule (20 mg total) by mouth daily. Medicine to help protect your stomach lining from ulcers while on coumadin 30 capsule 3  . potassium chloride SA (K-DUR,KLOR-CON) 20 MEQ tablet Take 1 tablet (20 mEq total) by mouth daily. 60 tablet 6  . simvastatin (ZOCOR) 10 MG tablet TAKE ONE TABLET BY MOUTH EVERY EVENING. 30 tablet 3  . tolterodine (DETROL LA) 4 MG 24 hr capsule Take 4 mg by mouth daily.    Marland Kitchen torsemide (DEMADEX) 100 MG tablet STARTING TOMORROW, TAKE A HALF A TABLET DAILY FOR 3 DAYS AND  THEN RESTART ONE TABLET DAILY THEREAFTER. (Patient taking differently: Take 100 mg by mouth daily. )    . warfarin (COUMADIN) 2.5 MG tablet Take 1.25-2.5 mg by mouth daily at 6 PM. Take 1 tab (2.5mg ) on Tues, Thurs, Sat, Sun and take 1/2 tab (1.25mg ) on Mon, Wed, Fri     No current facility-administered medications for this visit.    Functional Status: In your present state of health, do you have any difficulty performing the following activities: 05/08/2014 11/11/2013  Is the patient deaf or have difficulty hearing? N N  Hearing N N  Vision N N  Difficulty concentrating or making decisions Y Y  Walking or climbing stairs? N N  Doing errands, shopping? [name]    Fall/Depression Screening: No flowsheet data found.  Assessment: Reviewed CHF action plan with pt. Pt. Family  medications and transportation.  [facility] CM Care Plan        Patient Outreach Telephone from 05/28/2014 in [place] Problem One  knowledge deficit related to Heart Failure clinic protocols   Care Plan for Problem One  Active   [facility] Long Term Goal (31-90 days)  Pt and family will understand when to call Heart Failure clinic with weight gain or symptoms exacerbated within 90 days   [facility] Long Term Goal Start Date  05/14/14   [facility] CM Short Term  Goal #1 (0-30 days)  pt and family with verbalize understanding of prescribed medications for heart failure within 30 days.   [facility] CM Short Term Goal #1 Start Date  05/07/14   [facility] CM Short Term Goal #2 (0-30 days)  pt and family will verbalize CHF zones within 30 days.   [facility] CM Short Term Goal #2 Start Date  05/14/14       Plan: see pt for scheduled home visit on 06/07/14, this will complete transition of care week 4. Assess weight log, medications, edema. Collaborate with home health as needed.  Mack Guise [facility], BSN [place] Coordinator [phone]

## 2014-05-29 DIAGNOSIS — R0602 Shortness of breath: Secondary | ICD-10-CM | POA: Diagnosis not present

## 2014-05-29 DIAGNOSIS — M6281 Muscle weakness (generalized): Secondary | ICD-10-CM | POA: Diagnosis not present

## 2014-05-29 DIAGNOSIS — R531 Weakness: Secondary | ICD-10-CM | POA: Diagnosis not present

## 2014-05-29 NOTE — ED Provider Notes (Deleted)
CSN: [id number]     Arrival date & time 05/18/14  2831 History   First MD Initiated Contact with Patient 05/18/14 [phone]     Chief Complaint  Patient presents with  . Epistaxis     (Consider location/radiation/quality/duration/timing/severity/associated sxs/prior Treatment) HPI.... Right-sided epistaxis starting a brief time ago. Bleeding has now settled down. Patient is on Coumadin and has her INR checked regularly. She is not lightheaded. No chest pain or dyspnea. No other complaints.  Past Medical History  Diagnosis Date  . COPD (chronic obstructive pulmonary disease)   . History of TIA (transient ischemic attack)     Diagnosed 2003  . History of stroke     [address] [place]  . Obesity   . Juvenile rheumatic fever     Age 74  . Essential hypertension, benign   . Hyperlipidemia   . Mitral regurgitation     Status post bioprosthetic MVR (05/2013)  . CKD (chronic kidney disease) stage 4, GFR 15-29 ml/min   . Paroxysmal atrial fibrillation     s/p MAZE (05/2013)  . Chronic diastolic congestive heart failure     a) ECHO (09/2013) EF 50-55%  . Arthritis   . Aortic insufficiency due to bicuspid aortic valve     Moderate by TEE  . 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 S/P Maze operation for atrial fibrillation 05/16/2013    Complete bilateral atrial lesion set using cryothermy and bipolar radiofrequency ablation with oversewing of LA appendage  . NICM (nonischemic cardiomyopathy)     a) LHC (04/2013) no significant CAD   Past Surgical History  Procedure Laterality Date  . Total knee arthroplasty Right   . Abdominal hysterectomy      Cervical Cancer  . Parathyroid/thyroid surgery      Tumor  . Tee without cardioversion N/A 04/06/2013    Procedure: TRANSESOPHAGEAL ECHOCARDIOGRAM (TEE);  Surgeon: [name], MD;  [facility];  Service: Cardiology;  Laterality: N/A;  . Minimally invasive maze procedure N/A 05/16/2013    Procedure: MINIMALLY INVASIVE MAZE PROCEDURE;  Surgeon: Rexene Alberts, MD;  Location: [place];  Service: Open Heart Surgery;  Laterality: N/A;  . Intraoperative transesophageal echocardiogram N/A 05/16/2013    Procedure: INTRAOPERATIVE TRANSESOPHAGEAL ECHOCARDIOGRAM;  Surgeon: Rexene Alberts, MD;  Location: [place];  Service: Open Heart Surgery;  Laterality: N/A;  . Mitral valve replacement Right 05/16/2013    Procedure: MINIMALLY INVASIVE MITRAL VALVE (MV) REPLACEMENT;  Surgeon: Rexene Alberts, MD;  Location: [place];  Service: Open Heart Surgery;  Laterality: Right;  . Tee without cardioversion N/A 06/06/2013    Procedure: TRANSESOPHAGEAL ECHOCARDIOGRAM (TEE);  Surgeon: Larey Dresser, MD;  Location: [place];  Service: Cardiovascular;  Laterality: N/A;  . Cardioversion N/A 06/06/2013    Procedure: CARDIOVERSION;  Surgeon: Larey Dresser, MD;  Location: [facility] ENDOSCOPY;  Service: Cardiovascular;  Laterality: N/A;  . Left and right heart catheterization with coronary angiogram N/A 05/04/2013    Procedure: LEFT AND RIGHT HEART CATHETERIZATION WITH CORONARY ANGIOGRAM;  Surgeon: Jettie Booze, MD;  Location: [facility] CATH LAB;  Service: Cardiovascular;  Laterality: N/A;   Family History  Problem Relation Age of Onset  . Heart failure Father   . Heart attack Brother    History  Substance Use Topics  . Smoking status: Never Smoker   . Smokeless tobacco: Never  Used  . Alcohol Use: No   OB History    No data available     Review of Systems  All other systems reviewed and are negative.     Allergies  Indomethacin and Norvasc  Home Medications   Prior to Admission medications   Medication Sig Start Date End Date Taking? Authorizing Provider  amiodarone (PACERONE) 100 MG tablet Take 1 tablet (100 mg total) by mouth daily. Patient not taking: Reported on 05/18/2014 02/26/14   [name], MD  amiodarone (PACERONE) 200 MG tablet Take 200 mg by mouth daily.    Historical Provider, MD  ciprofloxacin (CIPRO) 500 MG tablet Take 1 tablet (500 mg total) by mouth every evening. Antibiotic to be taken for 3 more days. Patient not taking: Reported on 05/18/2014 05/13/14   Rexene Alberts, MD  colchicine 0.6 MG tablet Take 0.5 tablets (0.3 mg total) by mouth daily. 10/11/13   Rande Brunt, NP  ferrous sulfate 325 (65 FE) MG EC tablet Take 325 mg by mouth 3 (three) times daily with meals.    Historical Provider, MD  levothyroxine (SYNTHROID, LEVOTHROID) 50 MCG tablet Take 50 mcg by mouth daily before breakfast.    Historical Provider, MD  metolazone (ZAROXOLYN) 2.5 MG tablet Take 1 tablet (2.5 mg total) by mouth as directed. TAKE 1 TABLET AS NEEDED ONLY IF YOUR WEIGHT INCREASES 2-3 POUNDS IN 24 HOURS. 05/13/14   Rexene Alberts, MD  omeprazole (PRILOSEC) 20 MG capsule Take 1 capsule (20 mg total) by mouth daily. Medicine to help protect your stomach lining from ulcers while on coumadin 05/13/14   Rexene Alberts, MD  potassium chloride SA (K-DUR,KLOR-CON) 20 MEQ tablet Take 1 tablet (20 mEq total) by mouth daily. 05/30/13   Donielle Liston Alba, PA-C  simvastatin (ZOCOR) 10 MG tablet TAKE ONE TABLET BY MOUTH EVERY EVENING. 12/10/13   Tiffany L Reed, DO  tolterodine (DETROL LA) 4 MG 24 hr capsule Take 4 mg by mouth daily.    Historical Provider, MD  torsemide (DEMADEX) 100 MG tablet STARTING TOMORROW, TAKE A HALF A TABLET DAILY FOR 3 DAYS AND THEN RESTART ONE TABLET DAILY THEREAFTER. Patient taking differently: Take 100 mg by mouth daily.  05/13/14   Rexene Alberts, MD  warfarin (COUMADIN) 2.5 MG tablet Take 1.25-2.5 mg by mouth daily at 6 PM. Take 1 tab (2.5mg ) on Tues, Thurs, Sat, Sun and take 1/2 tab (1.25mg ) on Mon, Wed, Fri    Historical Provider, MD   BP 116/59 mmHg  Pulse 54  Temp(Src) 98.6 F (37 C) (Oral)  Resp 18  Ht 5\' 2"  (1.575 m)  Wt 164 lb (74.39 kg)  BMI 29.99 kg/m2  SpO2  97% Physical Exam  Constitutional: She is oriented to person, place, and time. She appears well-developed and well-nourished.  HENT:  Head: Normocephalic and atraumatic.  Nasal exam: Left nares normal. Right nares shows a good stable blood clot in the medial aspect. No bleeding.  Eyes: Conjunctivae and EOM are normal. Pupils are equal, round, and reactive to light.  Neck: Normal range of motion. Neck supple.  Cardiovascular: Normal rate and regular rhythm.   Pulmonary/Chest: Effort normal and breath sounds normal.  Abdominal: Soft. Bowel sounds are normal.  Musculoskeletal: Normal range of motion.  Neurological: She is alert and oriented to person, place, and time.  Skin: Skin is warm and dry.  Psychiatric: She has a normal mood and affect. Her behavior is normal.  Nursing note and vitals reviewed.   ED  Course  Procedures (including critical care time) Labs Review Labs Reviewed - No data to display  Imaging Review No results found.   EKG Interpretation None      MDM   Final diagnoses:  Right-sided epistaxis    Patient observed for 3 hours. No significant bleeding. Good blood clot.    [name], MD 05/29/14 1358  [name], MD 05/29/14 [phone]

## 2014-05-30 ENCOUNTER — Ambulatory Visit (INDEPENDENT_AMBULATORY_CARE_PROVIDER_SITE_OTHER): Payer: Commercial Managed Care - HMO | Admitting: Otolaryngology

## 2014-05-30 ENCOUNTER — Ambulatory Visit (INDEPENDENT_AMBULATORY_CARE_PROVIDER_SITE_OTHER): Payer: Commercial Managed Care - HMO | Admitting: *Deleted

## 2014-05-30 DIAGNOSIS — R040 Epistaxis: Secondary | ICD-10-CM | POA: Diagnosis not present

## 2014-05-30 DIAGNOSIS — Z9889 Other specified postprocedural states: Secondary | ICD-10-CM

## 2014-05-30 DIAGNOSIS — M6281 Muscle weakness (generalized): Secondary | ICD-10-CM | POA: Diagnosis not present

## 2014-05-30 DIAGNOSIS — I5033 Acute on chronic diastolic (congestive) heart failure: Secondary | ICD-10-CM

## 2014-05-30 DIAGNOSIS — Z8679 Personal history of other diseases of the circulatory system: Secondary | ICD-10-CM

## 2014-05-30 DIAGNOSIS — R0602 Shortness of breath: Secondary | ICD-10-CM | POA: Diagnosis not present

## 2014-05-30 DIAGNOSIS — I4891 Unspecified atrial fibrillation: Secondary | ICD-10-CM

## 2014-05-30 DIAGNOSIS — I639 Cerebral infarction, unspecified: Secondary | ICD-10-CM

## 2014-05-30 DIAGNOSIS — Z953 Presence of xenogenic heart valve: Secondary | ICD-10-CM

## 2014-05-30 LAB — POCT INR: INR: 2.6

## 2014-06-04 DIAGNOSIS — R0602 Shortness of breath: Secondary | ICD-10-CM | POA: Diagnosis not present

## 2014-06-04 DIAGNOSIS — M6281 Muscle weakness (generalized): Secondary | ICD-10-CM | POA: Diagnosis not present

## 2014-06-07 ENCOUNTER — Encounter: Payer: Self-pay | Admitting: *Deleted

## 2014-06-07 ENCOUNTER — Other Ambulatory Visit: Payer: Self-pay | Admitting: *Deleted

## 2014-06-07 DIAGNOSIS — M6281 Muscle weakness (generalized): Secondary | ICD-10-CM | POA: Diagnosis not present

## 2014-06-07 DIAGNOSIS — R0602 Shortness of breath: Secondary | ICD-10-CM | POA: Diagnosis not present

## 2014-06-07 NOTE — Patient Instructions (Signed)

## 2014-06-07 NOTE — Patient Outreach (Signed)
[facility]) Care Management   06/07/2014  LORIEANN ARGUETA 11-13-1940 [id number]  [name] [name] is an 74 y.o. female  Subjective: Pt reports home health RN, PT continues to work with her,  Home health RN came out earlier and applied gauze to area that was bleeding to RLE. Pt states continue to weigh daily and record, family assists with medications. Pt. Reports no longer has medicaid due to cannot meet deductible.  Objective:   Review of Systems  Respiratory: Negative for cough and shortness of breath.   Skin:       Gauze intact to RLE applied by home health RN.     Physical Exam  Constitutional: She is oriented to person, place, and time. She appears well-developed and well-nourished.  Cardiovascular: Regular rhythm.   Respiratory: Breath sounds normal.  Musculoskeletal: She exhibits edema.  1+ edema LE Bil.  Neurological: She is alert and oriented to person, place, and time.  Skin: Skin is warm and dry.  Psychiatric: She has a normal mood and affect.   Filed Vitals:   06/07/14 1146  BP: 124/62  Pulse: 58  Resp: 16  weight 168.8 Current Medications:   Current Outpatient Prescriptions  Medication Sig Dispense Refill  . amiodarone (PACERONE) 200 MG tablet Take 200 mg by mouth daily.    . colchicine 0.6 MG tablet Take 0.5 tablets (0.3 mg total) by mouth daily. 30 tablet 0  . ferrous sulfate 325 (65 FE) MG EC tablet Take 325 mg by mouth 3 (three) times daily with meals.    Marland Kitchen levothyroxine (SYNTHROID, LEVOTHROID) 50 MCG tablet Take 50 mcg by mouth daily before breakfast.    . metolazone (ZAROXOLYN) 2.5 MG tablet Take 1 tablet (2.5 mg total) by mouth as directed. TAKE 1 TABLET AS NEEDED ONLY IF YOUR WEIGHT INCREASES 2-3 POUNDS IN 24 HOURS.    Marland Kitchen omeprazole (PRILOSEC) 20 MG capsule Take 1 capsule (20 mg total) by mouth daily. Medicine to help protect your stomach lining from ulcers while on coumadin 30 capsule 3  . potassium chloride SA  (K-DUR,KLOR-CON) 20 MEQ tablet Take 1 tablet (20 mEq total) by mouth daily. 60 tablet 6  . simvastatin (ZOCOR) 10 MG tablet TAKE ONE TABLET BY MOUTH EVERY EVENING. 30 tablet 3  . tolterodine (DETROL LA) 4 MG 24 hr capsule Take 4 mg by mouth daily.    Marland Kitchen torsemide (DEMADEX) 100 MG tablet STARTING TOMORROW, TAKE A HALF A TABLET DAILY FOR 3 DAYS AND THEN RESTART ONE TABLET DAILY THEREAFTER. (Patient taking differently: Take 100 mg by mouth daily. )    . warfarin (COUMADIN) 2.5 MG tablet Take 1.25-2.5 mg by mouth daily at 6 PM. Take 1 tab (2.34m) on Tues, Thurs, Sat, Sun and take 1/2 tab (1.266m on Mon, Wed, Fri    . amiodarone (PACERONE) 100 MG tablet Take 1 tablet (100 mg total) by mouth daily. (Patient not taking: Reported on 05/18/2014) 30 tablet 3  . ciprofloxacin (CIPRO) 500 MG tablet Take 1 tablet (500 mg total) by mouth every evening. Antibiotic to be taken for 3 more days. (Patient not taking: Reported on 05/18/2014) 3 tablet 0   No current facility-administered medications for this visit.    Functional Status:   In your present state of health, do you have any difficulty performing the following activities: 06/07/2014 05/08/2014  Is the patient deaf or have difficulty hearing? Y N  Hearing N [employer]  Difficulty concentrating or making decisions Y [name]  Walking or climbing stairs? Y N  Doing errands, shopping? [name]  Preparing Food and eating ? N -  Using the Toilet? N -  In the past six months, have you accidently leaked urine? Y -  Do you have problems with loss of bowel control? N -  Managing your Medications? Y -  Managing your Finances? N -  Housekeeping or managing your Housekeeping? Y -    Fall/Depression Screening:    PHQ 2/9 Scores 06/07/2014  PHQ - 2 Score 0    Assessment:  Pt doing well at home with assistance of family,  Needs review and reinforcement, reminders. Reviewed CHF action plan.  [place]        Patient Outreach from 06/07/2014 in [place] for Problem One  Active   [facility] CM Short Term Goal #1 Met Date  06/07/14      Plan: follow up with home visit on 07/09/14  Assess weight, edema, medications.

## 2014-06-11 DIAGNOSIS — R0602 Shortness of breath: Secondary | ICD-10-CM | POA: Diagnosis not present

## 2014-06-11 DIAGNOSIS — M6281 Muscle weakness (generalized): Secondary | ICD-10-CM | POA: Diagnosis not present

## 2014-06-13 ENCOUNTER — Ambulatory Visit (INDEPENDENT_AMBULATORY_CARE_PROVIDER_SITE_OTHER): Payer: Commercial Managed Care - HMO | Admitting: *Deleted

## 2014-06-13 DIAGNOSIS — I4891 Unspecified atrial fibrillation: Secondary | ICD-10-CM

## 2014-06-13 DIAGNOSIS — I5033 Acute on chronic diastolic (congestive) heart failure: Secondary | ICD-10-CM

## 2014-06-13 DIAGNOSIS — Z8679 Personal history of other diseases of the circulatory system: Secondary | ICD-10-CM

## 2014-06-13 DIAGNOSIS — M6281 Muscle weakness (generalized): Secondary | ICD-10-CM | POA: Diagnosis not present

## 2014-06-13 DIAGNOSIS — I639 Cerebral infarction, unspecified: Secondary | ICD-10-CM

## 2014-06-13 DIAGNOSIS — R0602 Shortness of breath: Secondary | ICD-10-CM | POA: Diagnosis not present

## 2014-06-13 DIAGNOSIS — Z9889 Other specified postprocedural states: Secondary | ICD-10-CM

## 2014-06-13 DIAGNOSIS — Z953 Presence of xenogenic heart valve: Secondary | ICD-10-CM

## 2014-06-13 LAB — POCT INR: INR: 2.9

## 2014-06-15 DIAGNOSIS — M62830 Muscle spasm of back: Secondary | ICD-10-CM | POA: Diagnosis not present

## 2014-06-15 DIAGNOSIS — M1990 Unspecified osteoarthritis, unspecified site: Secondary | ICD-10-CM | POA: Diagnosis not present

## 2014-06-17 DIAGNOSIS — M62830 Muscle spasm of back: Secondary | ICD-10-CM | POA: Diagnosis not present

## 2014-06-20 ENCOUNTER — Encounter: Payer: Self-pay | Admitting: Cardiology

## 2014-06-20 ENCOUNTER — Ambulatory Visit (INDEPENDENT_AMBULATORY_CARE_PROVIDER_SITE_OTHER): Payer: Commercial Managed Care - HMO | Admitting: Cardiology

## 2014-06-20 VITALS — BP 128/60 | HR 60 | Ht 62.0 in | Wt 172.8 lb

## 2014-06-20 DIAGNOSIS — I480 Paroxysmal atrial fibrillation: Secondary | ICD-10-CM

## 2014-06-20 DIAGNOSIS — I5032 Chronic diastolic (congestive) heart failure: Secondary | ICD-10-CM

## 2014-06-20 DIAGNOSIS — N189 Chronic kidney disease, unspecified: Secondary | ICD-10-CM

## 2014-06-20 DIAGNOSIS — Z952 Presence of prosthetic heart valve: Secondary | ICD-10-CM

## 2014-06-20 DIAGNOSIS — Z954 Presence of other heart-valve replacement: Secondary | ICD-10-CM | POA: Diagnosis not present

## 2014-06-20 NOTE — Patient Instructions (Signed)
Your physician recommends that you schedule a follow-up appointment in: 6 weeks with Dr. Harl Bowie  Your physician recommends that you continue on your current medications as directed. Please refer to the Current Medication list given to you today.  Your physician recommends that you return for lab work BEFORE YOUR NEXT VISIT CBC/CMP/TSH/LIPIDS/MAG  Thank you for choosing [place]!!

## 2014-06-20 NOTE — Progress Notes (Signed)
Clinical Summary Ms. Edgin is a 74 y.o.female seen today for follow up of the following medical problems.   1. Mitral regurgitation  - hx of rheumatic fever, developed severe MR  -  MVR (33mm Edwards Magna Mitral bovine bioprosthetic tissue valve) 05/16/2013 with combined MAZE procedure  - echo 06/05/13 with LVEF 40-45%, normal functioning MVR  - weight had been stable at home, no significant SOB.   2. Combined Systolic/ diastolic heart failure  - she is not on ACE-I due to CKD  - recent admit 05/2014 with acute on chronic diastolic HF, diuresed 7 liters. Discharge weight 172 lbs.  - weights at home 169 lbs, stable.  - limiting sodium intake. Compliant with meds.  3. Paroxysmal afib  - recent MAZE procedure during MVR 05/16/2013  - underwent DCCV 06/06/13 for aflutter, remains on amio.  - new diagnosis made during 02/2013 admission to [facility] with coumadin. Has had some troubles with nosebleeds requiring cautery by ENT, INR was also above goal around that time. No recent significant bleeding.  - denies any palpitations   4. CKD Stage IV -most recent Cr 05/13/14 was 1.93, GFR of 25  5. HTN  - compliant with meds   Past Medical History  Diagnosis Date  . COPD (chronic obstructive pulmonary disease)   . History of TIA (transient ischemic attack)     Diagnosed 2003  . History of stroke     2000 [place]  . Obesity   . Juvenile rheumatic fever     Age 33  . Essential hypertension, benign   . Hyperlipidemia   . Mitral regurgitation     Status post bioprosthetic MVR (05/2013)  . CKD (chronic kidney disease) stage 4, GFR 15-29 ml/min   . Paroxysmal atrial fibrillation     s/p MAZE (05/2013)  . Chronic diastolic congestive heart failure     a) ECHO (09/2013) EF 50-55%  . Arthritis   . Aortic insufficiency due to bicuspid aortic valve     Moderate by TEE  . S/P minimally invasive mitral valve replacement with bioprosthetic valve and maze procedure 05/16/2013     27 mm Edwards magna mitral bovine bioprosthetic tissue valve placed via right thoracotomy  . S/P Maze operation for atrial fibrillation 05/16/2013    Complete bilateral atrial lesion set using cryothermy and bipolar radiofrequency ablation with oversewing of LA appendage  . NICM (nonischemic cardiomyopathy)     a) LHC (04/2013) no significant CAD     Allergies  Allergen Reactions  . Indomethacin Other (See Comments)    dizziness  . Norvasc [Amlodipine Besylate] Cough     Current Outpatient Prescriptions  Medication Sig Dispense Refill  . amiodarone (PACERONE) 100 MG tablet Take 1 tablet (100 mg total) by mouth daily. (Patient not taking: Reported on 05/18/2014) 30 tablet 3  . amiodarone (PACERONE) 200 MG tablet Take 200 mg by mouth daily.    . ciprofloxacin (CIPRO) 500 MG tablet Take 1 tablet (500 mg total) by mouth every evening. Antibiotic to be taken for 3 more days. (Patient not taking: Reported on 05/18/2014) 3 tablet 0  . colchicine 0.6 MG tablet Take 0.5 tablets (0.3 mg total) by mouth daily. 30 tablet 0  . ferrous sulfate 325 (65 FE) MG EC tablet Take 325 mg by mouth 3 (three) times daily with meals.    Marland Kitchen levothyroxine (SYNTHROID, LEVOTHROID) 50 MCG tablet Take 50 mcg by mouth daily before breakfast.    . metolazone (ZAROXOLYN) 2.5  MG tablet Take 1 tablet (2.5 mg total) by mouth as directed. TAKE 1 TABLET AS NEEDED ONLY IF YOUR WEIGHT INCREASES 2-3 POUNDS IN 24 HOURS.    Marland Kitchen omeprazole (PRILOSEC) 20 MG capsule Take 1 capsule (20 mg total) by mouth daily. Medicine to help protect your stomach lining from ulcers while on coumadin 30 capsule 3  . potassium chloride SA (K-DUR,KLOR-CON) 20 MEQ tablet Take 1 tablet (20 mEq total) by mouth daily. 60 tablet 6  . simvastatin (ZOCOR) 10 MG tablet TAKE ONE TABLET BY MOUTH EVERY EVENING. 30 tablet 3  . tolterodine (DETROL LA) 4 MG 24 hr capsule Take 4 mg by mouth daily.    Marland Kitchen torsemide (DEMADEX) 100 MG tablet STARTING TOMORROW, TAKE A HALF A  TABLET DAILY FOR 3 DAYS AND THEN RESTART ONE TABLET DAILY THEREAFTER. (Patient taking differently: Take 100 mg by mouth daily. )    . warfarin (COUMADIN) 2.5 MG tablet Take 1.25-2.5 mg by mouth daily at 6 PM. Take 1 tab (2.5mg ) on Tues, Thurs, Sat, Sun and take 1/2 tab (1.25mg ) on Mon, Wed, Fri     No current facility-administered medications for this visit.     Past Surgical History  Procedure Laterality Date  . Total knee arthroplasty Right   . Abdominal hysterectomy      Cervical Cancer  . Parathyroid/thyroid surgery      Tumor  . Tee without cardioversion N/A 04/06/2013    Procedure: TRANSESOPHAGEAL ECHOCARDIOGRAM (TEE);  Surgeon: [name], MD;  [facility];  Service: Cardiology;  Laterality: N/A;  . Minimally invasive maze procedure N/A 05/16/2013    Procedure: MINIMALLY INVASIVE MAZE PROCEDURE;  Surgeon: Rexene Alberts, MD;  Location: [place];  Service: Open Heart Surgery;  Laterality: N/A;  . Intraoperative transesophageal echocardiogram N/A 05/16/2013    Procedure: INTRAOPERATIVE TRANSESOPHAGEAL ECHOCARDIOGRAM;  Surgeon: Rexene Alberts, MD;  Location: [place];  Service: Open Heart Surgery;  Laterality: N/A;  . Mitral valve replacement Right 05/16/2013    Procedure: MINIMALLY INVASIVE MITRAL VALVE (MV) REPLACEMENT;  Surgeon: Rexene Alberts, MD;  Location: [place];  Service: Open Heart Surgery;  Laterality: Right;  . Tee without cardioversion N/A 06/06/2013    Procedure: TRANSESOPHAGEAL ECHOCARDIOGRAM (TEE);  Surgeon: Larey Dresser, MD;  Location: [place];  Service: Cardiovascular;  Laterality: N/A;  . Cardioversion N/A 06/06/2013    Procedure: CARDIOVERSION;  Surgeon: Larey Dresser, MD;  Location: [facility] ENDOSCOPY;  Service: Cardiovascular;  Laterality: N/A;  . Left and right heart catheterization with coronary angiogram N/A 05/04/2013    Procedure: LEFT AND RIGHT HEART CATHETERIZATION WITH CORONARY ANGIOGRAM;  Surgeon: Jettie Booze, MD;  Location: [facility] CATH LAB;   Service: Cardiovascular;  Laterality: N/A;     Allergies  Allergen Reactions  . Indomethacin Other (See Comments)    dizziness  . Norvasc [Amlodipine Besylate] Cough      Family History  Problem Relation Age of Onset  . Heart failure Father   . Heart attack Brother      Social History Ms. Rochford reports that she has never smoked. She has never used smokeless tobacco. Ms. Ewart reports that she does not drink alcohol.   Review of Systems CONSTITUTIONAL: No weight loss, fever, chills, weakness or fatigue.  HEENT: Eyes: No visual loss, blurred vision, double vision or yellow sclerae.No hearing loss, sneezing, congestion, runny nose or sore throat.  SKIN: No rash or itching.  CARDIOVASCULAR: per HPI RESPIRATORY: No shortness of breath, cough or sputum.  GASTROINTESTINAL: No anorexia, nausea, vomiting or diarrhea. No abdominal pain or blood.  GENITOURINARY: No burning on urination, no polyuria NEUROLOGICAL: No headache, dizziness, syncope, paralysis, ataxia, numbness or tingling in the extremities. No change in bowel or bladder control.  MUSCULOSKELETAL: No muscle, back pain, joint pain or stiffness.  LYMPHATICS: No enlarged nodes. No history of splenectomy.  PSYCHIATRIC: No history of depression or anxiety.  ENDOCRINOLOGIC: No reports of sweating, cold or heat intolerance. No polyuria or polydipsia.  Marland Kitchen   Physical Examination p 60 bp 128/60 Wt 172 lbs BMI 32 Gen: resting comfortably, no acute distress HEENT: no scleral icterus, pupils equal round and reactive, no palptable cervical adenopathy,  CV: RRR, no m/r/g, no JVD, no carotid bruits Resp: Clear to auscultation bilaterally GI: abdomen is soft, non-tender, non-distended, normal bowel sounds, no hepatosplenomegaly MSK: extremities are warm, no edema.  Skin: warm, no rash Neuro:  no focal deficits Psych: appropriate affect   Diagnostic Studies 06/05/13 Echo  Procedure narrative: Transthoracic  echocardiography. Poor endocardial definition. Intravenous contrast (Definity) was administered. - Left ventricle: The cavity size was normal. Wall thickness was normal. Systolic function was mildly to moderately reduced. The estimated ejection fraction was in the range of 40% to 45%. LV diastolic function cannot be assessed due to the prosthetic mitral valve. - Aortic valve: Poorly visualized. Mildly calcified leaflets. Transvalvular velocity was minimally increased. Mild regurgitation. - Mitral valve: Bioprosthetic mitral valve. Leaflets not well-visualized. Appears to be stable. Peak and mean gradients of 14 and 6 mmHg across the valve. Trivial regurgitation. Valve area by continuity equation (using LVOT flow): 1.33cm^2. - Right ventricle: The cavity size was mildly dilated. Systolic function is mildly reduced. - Right atrium: The atrium was normal in size. - Tricuspid valve: Mild regurgitation. - Pulmonary arteries: PA peak pressure: 49mm Hg (S). - Inferior vena cava: The vessel was dilated; the respirophasic diameter changes were blunted (< 50%); findings are consistent with elevated central venous pressure. - Pericardium, extracardiac: There was no pericardial effusion.  06/06/13 TEE  Study Conclusions  - Left ventricle: The cavity size was normal. Wall thickness was increased in a pattern of mild LVH. The estimated ejection fraction was 45%. Diffuse hypokinesis. - Aortic valve: Bicuspid. There was no stenosis. Mild to moderate regurgitation. - Mitral valve: Bioprosthetic mitral valve with trivial regurgitation and no significant stenosis. Pressure half-time: 33ms. - Left atrium: Ligated at prior surgery but there was still some flow into the appendage. No thrombus noted in appendage. The atrium was mildly dilated. - Right ventricle: The cavity size was normal. Systolic function was mildly reduced. - Right atrium: No evidence of thrombus in the atrial cavity or  appendage. - Tricuspid valve: Moderate regurgitation. Peak RV-RA gradient: 22mm Hg (S). Impressions:  - May proceed to DCCV.     Assessment and Plan  1. Mitral regurigation - s/p MVR with tissue valve. No current symptoms, appears euvolemic. Continue current meds  2. Chronic systolic/diastolic heart failure - medical therapy limited due to low blood pressures, not on beta blocker. No ACE due to severe CKD - weights stable at home, no current symptoms. Appears euvolemic in clinic. Continue current meds  3. Afib - no current symptoms. Continue amio and coumadin. Will check TSH and liver labs on amio  4. CKD IV - will repeat labs   F/u 6 weeks      [name], M.D.

## 2014-06-21 ENCOUNTER — Emergency Department (HOSPITAL_COMMUNITY)
Admission: EM | Admit: 2014-06-21 | Discharge: 2014-06-21 | Disposition: A | Discharge status: Home / Self Care | Payer: Commercial Managed Care - HMO | Attending: Emergency Medicine | Admitting: Emergency Medicine

## 2014-06-21 ENCOUNTER — Encounter (HOSPITAL_COMMUNITY): Payer: Self-pay | Admitting: *Deleted

## 2014-06-21 DIAGNOSIS — M1990 Unspecified osteoarthritis, unspecified site: Secondary | ICD-10-CM | POA: Insufficient documentation

## 2014-06-21 DIAGNOSIS — Q231 Congenital insufficiency of aortic valve: Secondary | ICD-10-CM | POA: Diagnosis not present

## 2014-06-21 DIAGNOSIS — I129 Hypertensive chronic kidney disease with stage 1 through stage 4 chronic kidney disease, or unspecified chronic kidney disease: Secondary | ICD-10-CM | POA: Diagnosis not present

## 2014-06-21 DIAGNOSIS — R404 Transient alteration of awareness: Secondary | ICD-10-CM | POA: Diagnosis not present

## 2014-06-21 DIAGNOSIS — I480 Paroxysmal atrial fibrillation: Secondary | ICD-10-CM | POA: Diagnosis not present

## 2014-06-21 DIAGNOSIS — Z7901 Long term (current) use of anticoagulants: Secondary | ICD-10-CM | POA: Insufficient documentation

## 2014-06-21 DIAGNOSIS — Z9889 Other specified postprocedural states: Secondary | ICD-10-CM | POA: Insufficient documentation

## 2014-06-21 DIAGNOSIS — N184 Chronic kidney disease, stage 4 (severe): Secondary | ICD-10-CM | POA: Diagnosis not present

## 2014-06-21 DIAGNOSIS — I5032 Chronic diastolic (congestive) heart failure: Secondary | ICD-10-CM | POA: Insufficient documentation

## 2014-06-21 DIAGNOSIS — Z79899 Other long term (current) drug therapy: Secondary | ICD-10-CM | POA: Insufficient documentation

## 2014-06-21 DIAGNOSIS — J449 Chronic obstructive pulmonary disease, unspecified: Secondary | ICD-10-CM | POA: Diagnosis not present

## 2014-06-21 DIAGNOSIS — E785 Hyperlipidemia, unspecified: Secondary | ICD-10-CM | POA: Insufficient documentation

## 2014-06-21 DIAGNOSIS — R040 Epistaxis: Secondary | ICD-10-CM | POA: Insufficient documentation

## 2014-06-21 DIAGNOSIS — R011 Cardiac murmur, unspecified: Secondary | ICD-10-CM | POA: Insufficient documentation

## 2014-06-21 DIAGNOSIS — Z8673 Personal history of transient ischemic attack (TIA), and cerebral infarction without residual deficits: Secondary | ICD-10-CM | POA: Diagnosis not present

## 2014-06-21 DIAGNOSIS — R531 Weakness: Secondary | ICD-10-CM | POA: Diagnosis not present

## 2014-06-21 DIAGNOSIS — E669 Obesity, unspecified: Secondary | ICD-10-CM | POA: Insufficient documentation

## 2014-06-21 LAB — PROTIME-INR
INR: 1.92 — ABNORMAL HIGH (ref 0.00–1.49)
Prothrombin Time: 22.2 seconds — ABNORMAL HIGH (ref 11.6–15.2)

## 2014-06-21 NOTE — ED Notes (Signed)
Per EMS, pt on 2L [place] chronic, pt on coumadin and had nosebleed from rt nare, bleeding stopped prior to hospital arrival. Pt placed on 2L  in triage, on RA pt's O2 sats 85%.

## 2014-06-21 NOTE — ED Notes (Signed)
Pt states she is wanting to go home. No bleeding at present.

## 2014-06-21 NOTE — Discharge Instructions (Signed)

## 2014-06-21 NOTE — ED Provider Notes (Signed)
CSN: [id number]     Arrival date & time 06/21/14  1738 History  This chart was scribed for [name] Manifold, MD by Hilda Lias, ED Scribe. This patient was seen in room APA16A/APA16A and the patient's care was started at 10:00 PM.    Chief Complaint  Patient presents with  . Epistaxis      The history is provided by the patient. No language interpreter was used.     HPI Comments: [name] [name] is a 74 y.o. female who presents to the Emergency Department complaining of constant nose bleeds that has been present for five hours. No bleeding at this time. Pt states that she had similar episodes 6 weeks ago and was seen here for the issue where she had her nasal canal "packed". Pt is on Coumadin for a heart valve replacement. Pt denies blood in stool and melena.     Past Medical History  Diagnosis Date  . COPD (chronic obstructive pulmonary disease)   . History of TIA (transient ischemic attack)     Diagnosed 2003  . History of stroke     2000 [place]  . Obesity   . Juvenile rheumatic fever     Age 58  . Essential hypertension, benign   . Hyperlipidemia   . Mitral regurgitation     Status post bioprosthetic MVR (05/2013)  . CKD (chronic kidney disease) stage 4, GFR 15-29 ml/min   . Paroxysmal atrial fibrillation     s/p MAZE (05/2013)  . Chronic diastolic congestive heart failure     a) ECHO (09/2013) EF 50-55%  . Arthritis   . Aortic insufficiency due to bicuspid aortic valve     Moderate by TEE  . 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 S/P Maze operation for atrial fibrillation 05/16/2013    Complete bilateral atrial lesion set using cryothermy and bipolar radiofrequency ablation with oversewing of LA appendage  . NICM (nonischemic cardiomyopathy)     a) LHC (04/2013) no significant CAD   Past Surgical History   Procedure Laterality Date  . Total knee arthroplasty Right   . Abdominal hysterectomy      Cervical Cancer  . Parathyroid/thyroid surgery      Tumor  . Tee without cardioversion N/A 04/06/2013    Procedure: TRANSESOPHAGEAL ECHOCARDIOGRAM (TEE);  Surgeon: [name], MD;  [facility];  Service: Cardiology;  Laterality: N/A;  . Minimally invasive maze procedure N/A 05/16/2013    Procedure: MINIMALLY INVASIVE MAZE PROCEDURE;  Surgeon: Rexene Alberts, MD;  Location: [place];  Service: Open Heart Surgery;  Laterality: N/A;  . Intraoperative transesophageal echocardiogram N/A 05/16/2013    Procedure: INTRAOPERATIVE TRANSESOPHAGEAL ECHOCARDIOGRAM;  Surgeon: Rexene Alberts, MD;  Location: [place];  Service: Open Heart Surgery;  Laterality: N/A;  . Mitral valve replacement Right 05/16/2013    Procedure: MINIMALLY INVASIVE MITRAL VALVE (MV) REPLACEMENT;  Surgeon: Rexene Alberts, MD;  Location: [place];  Service: Open Heart Surgery;  Laterality: Right;  . Tee without cardioversion N/A 06/06/2013    Procedure: TRANSESOPHAGEAL ECHOCARDIOGRAM (TEE);  Surgeon: Larey Dresser, MD;  Location: [place];  Service: Cardiovascular;  Laterality: N/A;  . Cardioversion N/A 06/06/2013    Procedure: CARDIOVERSION;  Surgeon: Larey Dresser, MD;  Location: [facility] ENDOSCOPY;  Service: Cardiovascular;  Laterality: N/A;  . Left and right heart catheterization with coronary  angiogram N/A 05/04/2013    Procedure: LEFT AND RIGHT HEART CATHETERIZATION WITH CORONARY ANGIOGRAM;  Surgeon: Jettie Booze, MD;  Location: [facility] CATH LAB;  Service: Cardiovascular;  Laterality: N/A;   Family History  Problem Relation Age of Onset  . Heart failure Father   . Heart attack Brother    History  Substance Use Topics  . Smoking status: Never Smoker   . Smokeless tobacco: Never Used  . Alcohol Use: No   OB History    No data available     Review of Systems  HENT: Positive for nosebleeds.   Gastrointestinal: Negative  for blood in stool.  All other systems reviewed and are negative.     Allergies  Indomethacin and Norvasc  Home Medications   Prior to Admission medications   Medication Sig Start Date End Date Taking? Authorizing Provider  amiodarone (PACERONE) 200 MG tablet Take 200 mg by mouth daily.    Yes Historical Provider, MD  colchicine 0.6 MG tablet Take 0.5 tablets (0.3 mg total) by mouth daily. 10/11/13  Yes Rande Brunt, NP  cyclobenzaprine (FLEXERIL) 10 MG tablet Take 10 mg by mouth 3 (three) times daily as needed for muscle spasms.   Yes Historical Provider, MD  HYDROcodone-acetaminophen (NORCO/VICODIN) 5-325 MG per tablet Take 1 tablet by mouth 4 (four) times daily as needed for moderate pain.    Yes Historical Provider, MD  levothyroxine (SYNTHROID, LEVOTHROID) 50 MCG tablet Take 50 mcg by mouth daily before breakfast.   Yes Historical Provider, MD  metolazone (ZAROXOLYN) 2.5 MG tablet Take 1 tablet (2.5 mg total) by mouth as directed. TAKE 1 TABLET AS NEEDED ONLY IF YOUR WEIGHT INCREASES 2-3 POUNDS IN 24 HOURS. Patient taking differently: Take 2.5 mg by mouth daily. . 05/13/14  Yes Rexene Alberts, MD  omeprazole (PRILOSEC) 20 MG capsule Take 1 capsule (20 mg total) by mouth daily. Medicine to help protect your stomach lining from ulcers while on coumadin 05/13/14  Yes Rexene Alberts, MD  potassium chloride SA (K-DUR,KLOR-CON) 20 MEQ tablet Take 1 tablet (20 mEq total) by mouth daily. 05/30/13  Yes Donielle Liston Alba, PA-C  simvastatin (ZOCOR) 10 MG tablet TAKE ONE TABLET BY MOUTH EVERY EVENING. 12/10/13  Yes Tiffany L Reed, DO  tolterodine (DETROL LA) 4 MG 24 hr capsule Take 4 mg by mouth daily.   Yes Historical Provider, MD  torsemide (DEMADEX) 100 MG tablet STARTING TOMORROW, TAKE A HALF A TABLET DAILY FOR 3 DAYS AND THEN RESTART ONE TABLET DAILY THEREAFTER. Patient taking differently: Take 100 mg by mouth every morning.  05/13/14  Yes Rexene Alberts, MD  warfarin (COUMADIN) 2.5 MG tablet Take  1.25-2.5 mg by mouth daily at 6 PM. Take 1 tab (2.5mg ) on Tues, Thurs, Sat, Sun and take 1/2 tab (1.25mg ) on Mon, Wed, Fri   Yes Historical Provider, MD  cyanocobalamin (,VITAMIN B-12,) 1000 MCG/ML injection Inject 1 mL into the muscle every 30 (thirty) days. 06/14/14   Historical Provider, MD   BP 124/96 mmHg  Pulse 51  Temp(Src) 98.3 F (36.8 C) (Oral)  Resp 17  Ht 5\' 2"  (1.575 m)  Wt 168 lb 4 oz (76.318 kg)  BMI 30.77 kg/m2  SpO2 93% Physical Exam  Constitutional: She is oriented to person, place, and time. She appears well-developed and well-nourished.  HENT:  Head: Normocephalic and atraumatic.  Nasal septum on right small scab area No active bleeding   Cardiovascular: Normal rate.   Murmur heard. Systolic murmur  Pulmonary/Chest: Effort normal  and breath sounds normal. No respiratory distress. She has no wheezes. She has no rales. She exhibits no tenderness.  Abdominal: She exhibits no distension.  Neurological: She is alert and oriented to person, place, and time.  Skin: Skin is warm and dry.  Psychiatric: She has a normal mood and affect.  Nursing note and vitals reviewed.   ED Course  Procedures (including critical care time)  DIAGNOSTIC STUDIES: Oxygen Saturation is 93% on [place], adequate by my interpretation.    COORDINATION OF CARE: 10:06 PM Discussed treatment plan with pt at bedside and pt agreed to plan.   Labs Review Labs Reviewed  PROTIME-INR - Abnormal; Notable for the following:    Prothrombin Time 22.2 (*)    INR 1.92 (*)    All other components within normal limits    Imaging Review No results found.   EKG Interpretation None      MDM   Final diagnoses:  Anterior epistaxis  Anticoagulated on Coumadin    74 year old female with anterior epistaxis. 74 year old female with anterior epistaxis. Bleeding has since resolved. No symptoms of acute blood loss anemia. She is on Coumadin. INR is 1.92. Return precautions discussed.  I personally  preformed the services scribed in my presence. The recorded information has been reviewed is accurate. [name] Manifold, MD.    [name] Manifold, MD 06/25/14 [phone]

## 2014-06-21 NOTE — ED Notes (Signed)
Pt alert & oriented x4, stable gait. Patient  given discharge instructions, paperwork & prescription(s).  Patient verbalized understanding. Pt left department in wheelchair w/ no further questions. 

## 2014-06-21 NOTE — ED Notes (Signed)
No bleeding from nose at this time. Pt states she has had some nose bleeds in the past the last was about 5 weeks ago.

## 2014-06-22 DIAGNOSIS — I509 Heart failure, unspecified: Secondary | ICD-10-CM | POA: Diagnosis not present

## 2014-06-25 DIAGNOSIS — H10022 Other mucopurulent conjunctivitis, left eye: Secondary | ICD-10-CM | POA: Diagnosis not present

## 2014-06-29 DIAGNOSIS — R531 Weakness: Secondary | ICD-10-CM | POA: Diagnosis not present

## 2014-07-02 ENCOUNTER — Other Ambulatory Visit: Payer: Self-pay | Admitting: Licensed Clinical Social Worker

## 2014-07-02 NOTE — Patient Outreach (Signed)
   Assessment:  Previous documentation in [facility] record.  CSW spoke via phone on 07/02/14 with RN [name] related to needs of client.  Almyra Free and CSW spoke of fact that client now has lift chair in her home and is using lift chair effectively.  Client also has now completed a [place] and her daughter, [name], is now [place] for client. Client has been communicating with RN [name] to discuss nursing needs of client and working towards care plan goals for client.  CSW called phone number for [name], daughter of client, on 07/02/14. CSW was not able to speak with [name] but did leave [name] phone message requesting that she please return call to [place] at [phone] to discuss current needs of client.  CSW later spoke with [name] on 07/02/14.  CSW verified identity of [name]. [name] said that client saw her primary care doctor recently, (Dr. Quintin Alto).  Client is using lift chair and it is helping client to use lift chair.  Client receives $16.00 per month for food stamps benefit.  Client has difficulty meeting financial needs.  [name] reported that client used to have Medicaid but at present is not on Medicaid (does not qualify at present).  Client is struggling with some continency issues. She uses briefs/diapers to help with continency issues.  [name] said that client probably spends about $ 50.00 per month on diapers/briefs.  [name] said client saw Dr. Harl Bowie, cardiolgoist, about two weeks ago. [name] said that client is challenged with stretching client's monthly income to pay all needed expenses.  CSW informed [name] that CSW would discuss above information with RN [name] regarding current needs of client. [name] agreed to this plan. [name] was appreciative of call from [place] on 07/02/14.   Plan: Client to communicate with RN [name], as needed, to discuss nursing needs of client. CSW and RN [name] to collaborate in  monitoring ongoing needs of client. CSW to call client/[name] in two weeks to assess needs of client at that time.  [name].Francine Hannan MSW, LCSW Licensed Clinical Social Worker [facility] Care Management [phone]

## 2014-07-04 ENCOUNTER — Other Ambulatory Visit: Payer: Self-pay | Admitting: Licensed Clinical Social Worker

## 2014-07-04 ENCOUNTER — Ambulatory Visit (INDEPENDENT_AMBULATORY_CARE_PROVIDER_SITE_OTHER): Payer: Commercial Managed Care - HMO | Admitting: Otolaryngology

## 2014-07-04 NOTE — Patient Outreach (Signed)
   Assessment:  Previous documentation in [facility] record.  CSW called client home phone number [phone]) 2 times on 07/04/14. CSW was not able to speak via phone with client; also, client did not have phone message system set up and thus CSW could not leave phone message for client.  CSW had talked previously with [name] [name], daughter of client, regarding financial needs of client. [name] [name] has said that client had previously been on Medicaid but was not on Medicaid at present. Client does receive food stamps benefit of $16.00 per month.   CSW to communicate, as needed, with RN [name] [name], to discuss current needs of client.  Plan: CSW to call client/[name] [name] (daughter of client) in two weeks to assess needs of client. Client to take medications as prescribed and to attend scheduled medical appointments.  [name] [name].[name] [name] MSW, LCSW Licensed Clinical Social Worker [facility] Care Management [phone]

## 2014-07-05 ENCOUNTER — Encounter: Payer: Self-pay | Admitting: Licensed Clinical Social Worker

## 2014-07-05 NOTE — Patient Outreach (Signed)
[employer] [facility]) Care Management  [facility] Social Work  07/05/2014  [name] [name] [birth year] [id number]  Subjective:    Objective:   Current Medications:  Current Outpatient Prescriptions  Medication Sig Dispense Refill  . amiodarone (PACERONE) 200 MG tablet Take 200 mg by mouth daily.     . colchicine 0.6 MG tablet Take 0.5 tablets (0.3 mg total) by mouth daily. 30 tablet 0  . cyanocobalamin (,VITAMIN B-12,) 1000 MCG/ML injection Inject 1 mL into the muscle every 30 (thirty) days.  12  . cyclobenzaprine (FLEXERIL) 10 MG tablet Take 10 mg by mouth 3 (three) times daily as needed for muscle spasms.    Marland Kitchen HYDROcodone-acetaminophen (NORCO/VICODIN) 5-325 MG per tablet Take 1 tablet by mouth 4 (four) times daily as needed for moderate pain.     Marland Kitchen levothyroxine (SYNTHROID, LEVOTHROID) 50 MCG tablet Take 50 mcg by mouth daily before breakfast.    . metolazone (ZAROXOLYN) 2.5 MG tablet Take 1 tablet (2.5 mg total) by mouth as directed. TAKE 1 TABLET AS NEEDED ONLY IF YOUR WEIGHT INCREASES 2-3 POUNDS IN 24 HOURS. (Patient taking differently: Take 2.5 mg by mouth daily. Marland Kitchen)    . omeprazole (PRILOSEC) 20 MG capsule Take 1 capsule (20 mg total) by mouth daily. Medicine to help protect your stomach lining from ulcers while on coumadin 30 capsule 3  . potassium chloride SA (K-DUR,KLOR-CON) 20 MEQ tablet Take 1 tablet (20 mEq total) by mouth daily. 60 tablet 6  . simvastatin (ZOCOR) 10 MG tablet TAKE ONE TABLET BY MOUTH EVERY EVENING. 30 tablet 3  . tolterodine (DETROL LA) 4 MG 24 hr capsule Take 4 mg by mouth daily.    Marland Kitchen torsemide (DEMADEX) 100 MG tablet STARTING TOMORROW, TAKE A HALF A TABLET DAILY FOR 3 DAYS AND THEN RESTART ONE TABLET DAILY THEREAFTER. (Patient taking differently: Take 100 mg by mouth every morning. )    . warfarin (COUMADIN) 2.5 MG tablet Take 1.25-2.5 mg by mouth daily at 6 PM. Take 1 tab (2.5mg ) on Tues, Thurs, Sat, Sun and take 1/2 tab (1.25mg ) on Mon, Wed, Fri      No current facility-administered medications for this visit.    Functional Status:  In your present state of health, do you have any difficulty performing the following activities: 06/07/2014 05/08/2014  Hearing? Y N  Vision? N N  Difficulty concentrating or making decisions? N N  Walking or climbing stairs? Y Y  Dressing or bathing? Y N  Doing errands, shopping? [name]  Preparing Food and eating ? N -  Using the Toilet? N -  In the past six months, have you accidently leaked urine? Y -  Do you have problems with loss of bowel control? N -  Managing your Medications? Y -  Managing your Finances? N -  Housekeeping or managing your Housekeeping? Y -    Fall/Depression Screening:  PHQ 2/9 Scores 06/07/2014  PHQ - 2 Score 0    Assessment:   Previous documentation in [facility] record.  CSW completed chart review on client on 07/05/14. CSW updated care plan information for client on 07/05/14.    Plan:   Client to take medications as prescribed and to attend scheduled medical appointments. Client to communicate with RN [name], as needed, to address nursing needs of client. CSW to call client/[name] [name] (daughter of client) in two weeks to assess needs of client at that time.   [name].Basia Mcginty MSW, LCSW Licensed Clinical Social Worker [facility] Care Management [phone]

## 2014-07-09 ENCOUNTER — Other Ambulatory Visit: Payer: Self-pay | Admitting: *Deleted

## 2014-07-09 ENCOUNTER — Encounter: Payer: Self-pay | Admitting: *Deleted

## 2014-07-09 NOTE — Patient Outreach (Signed)
[facility]) Care Management   07/09/2014  GRACELYN COVENTRY 11/07/1940 [id number]  [name] [name] is an 74 y.o. female  Subjective: Routine home visit with pt, HIPAA verified, pt reports the following:  Lost weight fluid and has cut back portion sizes, weight yesterday 151, problems with scales today so did not weigh today.  Pt states " I wonder when home health is giving me B12 shot".  Physical therapist discharged, pt states this helped her to be able to lift her legs to get in the car.  Reports [employer] nurse still calls monthly.  Objective:   Filed Vitals:   07/09/14 1221  BP: 118/60  Pulse: 72  Resp: 16  weight yesterday 151 pounds ROS  Physical Exam  Constitutional: She is oriented to person, place, and time. She appears well-developed and well-nourished.  HENT:  Head: Normocephalic.  Neck: Normal range of motion.  Cardiovascular: Normal rate, regular rhythm and normal heart sounds.   Respiratory: Breath sounds normal.  GI: Soft. Bowel sounds are normal.  Musculoskeletal: She exhibits edema.  1+ edema lower extremity  Neurological: She is alert and oriented to person, place, and time.  Skin: Skin is warm and dry.  Psychiatric: She has a normal mood and affect. Her behavior is normal. Judgment and thought content normal.    Current Medications:   Current Outpatient Prescriptions  Medication Sig Dispense Refill  . amiodarone (PACERONE) 200 MG tablet Take 200 mg by mouth daily.     . colchicine 0.6 MG tablet Take 0.5 tablets (0.3 mg total) by mouth daily. 30 tablet 0  . cyanocobalamin (,VITAMIN B-12,) 1000 MCG/ML injection Inject 1 mL into the muscle every 30 (thirty) days.  12  . cyclobenzaprine (FLEXERIL) 10 MG tablet Take 10 mg by mouth 3 (three) times daily as needed for muscle spasms.    Marland Kitchen HYDROcodone-acetaminophen (NORCO/VICODIN) 5-325 MG per tablet Take 1 tablet by mouth 4 (four) times daily as needed for moderate pain.     Marland Kitchen levothyroxine  (SYNTHROID, LEVOTHROID) 50 MCG tablet Take 50 mcg by mouth daily before breakfast.    . metolazone (ZAROXOLYN) 2.5 MG tablet Take 1 tablet (2.5 mg total) by mouth as directed. TAKE 1 TABLET AS NEEDED ONLY IF YOUR WEIGHT INCREASES 2-3 POUNDS IN 24 HOURS. (Patient taking differently: Take 2.5 mg by mouth daily. Marland Kitchen)    . omeprazole (PRILOSEC) 20 MG capsule Take 1 capsule (20 mg total) by mouth daily. Medicine to help protect your stomach lining from ulcers while on coumadin 30 capsule 3  . potassium chloride SA (K-DUR,KLOR-CON) 20 MEQ tablet Take 1 tablet (20 mEq total) by mouth daily. 60 tablet 6  . simvastatin (ZOCOR) 10 MG tablet TAKE ONE TABLET BY MOUTH EVERY EVENING. 30 tablet 3  . tolterodine (DETROL LA) 4 MG 24 hr capsule Take 4 mg by mouth daily.    Marland Kitchen torsemide (DEMADEX) 100 MG tablet STARTING TOMORROW, TAKE A HALF A TABLET DAILY FOR 3 DAYS AND THEN RESTART ONE TABLET DAILY THEREAFTER. (Patient taking differently: Take 100 mg by mouth every morning. )    . warfarin (COUMADIN) 2.5 MG tablet Take 1.25-2.5 mg by mouth daily at 6 PM. Take 1 tab (2.67m) on Tues, Thurs, Sat, Sun and take 1/2 tab (1.23m on Mon, Wed, Fri     No current facility-administered medications for this visit.    Functional Status:   In your present state of health, do you have any difficulty performing the following activities: 06/07/2014 05/08/2014  Hearing? Y N  Vision? N N  Difficulty concentrating or making decisions? N N  Walking or climbing stairs? Y Y  Dressing or bathing? Y N  Doing errands, shopping? [name]  Preparing Food and eating ? N -  Using the Toilet? N -  In the past six months, have you accidently leaked urine? Y -  Do you have problems with loss of bowel control? N -  Managing your Medications? Y -  Managing your Finances? N -  Housekeeping or managing your Housekeeping? Y -    Fall/Depression Screening:    PHQ 2/9 Scores 06/07/2014  PHQ - 2 Score 0    Assessment:  RN CM called [facility],  left voicemail with Duwayne Heck RN about B12 injection, Anderson Malta called pt back and reported pt will need to get B12 injection at MD office as home health no longer will be giving the injection,  Pt says she will go into [facility] office for this.  Pt to have protime/ INR checked this Friday.  RN CM reviewed all medication bottles and reviewed with pt.  RN CM troubleshooted scales and they do work now, pt will get new batteries today to have on hand. Pt continues weighing daily and recording, family assists with medications.  RN CM discussed discharge plan with pt and pt agreeable to be transferred to [facility] telephonic RN for monthly outreach.  RN CM reviewed CHF action plan/ zones and when to call MD.  RN CM reviewed low salt diet and limiting eating out at restaurants. [facility] LCSW still involved with pt.  [facility] CM Care Plan        Most Recent Value   Problem One    Care Plan Problem One  knowledge deficit related to Heart Failure clinic protocols   Role Documenting the Problem One  Care Management Coordinator   [facility] CM Care Plan Problem One     Care Plan for Problem One  Active   Patient Has Long Term Goal?  Yes   [facility]N Long Term Goal (31-90 days)  Pt and family will understand when to call Heart Failure clinic with weight gain or symptoms exacerbated within 90 days   THN Long Term Goal Start Date  05/14/14   Problem One Short Term Goals    Short Term Goal #1    [facility] CM Short Term Goal #1 (0-30 days)  pt and family with verbalize understanding of prescribed medications for heart failure within 30 days.   [facility] CM Short Term Goal #1 Start Date  05/07/14   [facility] CM Short Term Goal #1 Met Date  06/07/14   Interventions for Short Term Goal #1  RN CM reviewed all medication bottles and purpose of medications with pt.   Short Term Goal #2    [facility] CM Short Term Goal #2 (0-30 days)  pt and family will verbalize CHF zones within 30 days.   [facility] CM Short Term Goal #2 Start Date  05/14/14   [facility] CM Short Term Goal #2 Met  Date  07/09/14   Interventions for Short Term Goal #2  reviewed CHF zones and action plan   Short Term Goal #3    Short Term Goal #4    Short Tern Goal #5    Problem Two    Care Plan Problem Two  Decreased endurance related to disease process CHF [From [facility] system]   Role Documenting the Problem Two  Care Management Coordinator   EMMI for Problem Two  No   [facility] CM Care Plan Problem Two    Care Plan for Problem Two  Active   THN Long Term Goal (31-90) days  Pt will be able to tolerate activity with increased endurance within 60 days   THN Long Term Goal Start Date  04/09/14   Problem Two Short Term Goals    Short Term Goal #1    [facility] CM Short Term Goal #1 (0-30 days)  Pt will work with physical therapist for increased endurance and be able to lift legs within 30 days   [facility] CM Short Term Goal #1 Start Date  05/07/14   [facility] CM Short Term Goal #1 Met Date   07/09/14   Short Term Goal #2    Short Term Goal #3    Short Term Goal #4    Short Term Goal #5    Problem Three    THN CM Care Plan Problem Three    Problem Three Short Term Goals    Short Term Goal #1    Short Term Goal #2    Short Term Goal #3    Short Term Goal #4    Short Term Goal #5       Plan: transfer care to [facility] telephonic RN, sent In Basket to Lurline Del and requested RN to be assigned call for report, notified [facility] LCSW Theadore Nan of community care manager discharge and transfer to telephonic RN.  [name] [facility], [place] Coordinator [phone]

## 2014-07-10 ENCOUNTER — Encounter: Payer: Self-pay | Admitting: *Deleted

## 2014-07-10 NOTE — Patient Outreach (Signed)
[facility] [facility]) Care Management  07/10/2014  [name] [name] 1940-12-26 [id number]   07/10/14- Care transferred and report given to telephonic RN [name], reviewed plan of care, daily weights, medication adherence and making sure pt is keeping all appointments.  [name] [facility], [place] Coordinator [phone]

## 2014-07-12 ENCOUNTER — Other Ambulatory Visit: Payer: Self-pay

## 2014-07-12 DIAGNOSIS — D519 Vitamin B12 deficiency anemia, unspecified: Secondary | ICD-10-CM | POA: Diagnosis not present

## 2014-07-12 NOTE — Patient Outreach (Signed)
[facility]) Care Management  07/12/2014  ADRAIN BUTRICK [birth year] [id number]   Telephone call to patient for introduction.  No answer.  States VM not set up.  Plan: RN health coach will attempt again within 1 week.    [name], RN, MSN [place] Management [phone]

## 2014-07-16 ENCOUNTER — Ambulatory Visit (INDEPENDENT_AMBULATORY_CARE_PROVIDER_SITE_OTHER): Payer: Commercial Managed Care - HMO | Admitting: *Deleted

## 2014-07-16 DIAGNOSIS — Z9889 Other specified postprocedural states: Secondary | ICD-10-CM

## 2014-07-16 DIAGNOSIS — I5043 Acute on chronic combined systolic (congestive) and diastolic (congestive) heart failure: Secondary | ICD-10-CM | POA: Diagnosis not present

## 2014-07-16 DIAGNOSIS — Z7901 Long term (current) use of anticoagulants: Secondary | ICD-10-CM

## 2014-07-16 DIAGNOSIS — I5033 Acute on chronic diastolic (congestive) heart failure: Secondary | ICD-10-CM

## 2014-07-16 DIAGNOSIS — I639 Cerebral infarction, unspecified: Secondary | ICD-10-CM

## 2014-07-16 DIAGNOSIS — Z8679 Personal history of other diseases of the circulatory system: Secondary | ICD-10-CM

## 2014-07-16 DIAGNOSIS — Z953 Presence of xenogenic heart valve: Secondary | ICD-10-CM

## 2014-07-16 DIAGNOSIS — I4891 Unspecified atrial fibrillation: Secondary | ICD-10-CM

## 2014-07-16 LAB — POCT INR: INR: 2.5

## 2014-07-17 ENCOUNTER — Other Ambulatory Visit: Payer: Self-pay

## 2014-07-17 NOTE — Patient Outreach (Signed)
[facility]) Care Management  07/17/2014  [name] [name] Apr 29, 1940 [id number]     Subjective: Telephone outreach to patient to introduce self to patient and health coach role.  Patient states she is doing well and receptive to outreach.  She reports going to get her coumadin checked yesterday and level was great.  To return for recheck in one month.  Patient reports she is keeping her weight down.    Plan: RN Health Coach will reach out to patient within one month and patient agrees to next outreach.   [name], RN, MSN [place] Management [phone]

## 2014-07-17 NOTE — Addendum Note (Signed)
Addended by: [name] on: 07/17/2014 11:08 AM   Modules accepted: Miquel Dunn

## 2014-07-18 NOTE — Addendum Note (Signed)
Addended by: [name] on: 07/18/2014 03:19 PM   Modules accepted: Level of Service, SmartSet

## 2014-07-18 NOTE — Progress Notes (Signed)
This encounter was created in error - please disregard.

## 2014-07-22 DIAGNOSIS — I509 Heart failure, unspecified: Secondary | ICD-10-CM | POA: Diagnosis not present

## 2014-07-29 DIAGNOSIS — R531 Weakness: Secondary | ICD-10-CM | POA: Diagnosis not present

## 2014-08-01 ENCOUNTER — Ambulatory Visit (INDEPENDENT_AMBULATORY_CARE_PROVIDER_SITE_OTHER): Payer: Commercial Managed Care - HMO | Admitting: Cardiology

## 2014-08-01 ENCOUNTER — Encounter: Payer: Self-pay | Admitting: Cardiology

## 2014-08-01 VITALS — BP 136/68 | HR 69 | Ht 62.0 in | Wt 150.0 lb

## 2014-08-01 DIAGNOSIS — I4891 Unspecified atrial fibrillation: Secondary | ICD-10-CM | POA: Diagnosis not present

## 2014-08-01 DIAGNOSIS — I5022 Chronic systolic (congestive) heart failure: Secondary | ICD-10-CM | POA: Diagnosis not present

## 2014-08-01 DIAGNOSIS — I5032 Chronic diastolic (congestive) heart failure: Secondary | ICD-10-CM | POA: Diagnosis not present

## 2014-08-01 NOTE — Patient Instructions (Signed)
Your physician wants you to follow-up in: 6 months with Dr. Bryna Colander will receive a reminder letter in the mail two months in advance. If you don't receive a letter, please call our office to schedule the follow-up appointment.  Your physician recommends that you continue on your current medications as directed. Please refer to the Current Medication list given to you today.  Your physician recommends that you return for lab work CMP/CBC/TSH/LIPIDS/MAG  Thank you for choosing [facility]!!

## 2014-08-01 NOTE — Progress Notes (Signed)
Clinical Summary [name] [name] is a 74 y.o.female seen today for follow up of the following medical problems.   1. Mitral regurgitation  - hx of rheumatic fever, developed severe MR  - MVR (74mm Edwards Magna Mitral bovine bioprosthetic tissue valve) 05/16/2013 with combined MAZE procedure  - echo 06/05/13 with LVEF 40-45%, normal functioning MVR   - weight had been stable at home, no significant SOB.   2. Combined Systolic/ diastolic heart failure  - she is not on ACE-I due to CKD  .  - denies any SOB or DOE. No LE edema. Weight continues to drop, down 20 lbs since dishcarge 05/2014.    3. Paroxysmal afib  -  MAZE procedure during MVR 05/16/2013  - after Maze procedure episode of aflutter, underwent DCCV 06/06/13 for aflutter, remains on amio and coumadin.  - . Has had some troubles with nosebleeds requiring cautery by ENT in the past. Most recently see in ER 06/2014 with nose bleed, INR was 1.92 at that time.  - denies any palpitations   4. CKD Stage IV -most recent Cr 05/13/14 was 1.93, GFR of 25   Past Medical History  Diagnosis Date  . COPD (chronic obstructive pulmonary disease)   . History of TIA (transient ischemic attack)     Diagnosed 2003  . History of stroke     2000 [place]  . Obesity   . Juvenile rheumatic fever     Age 22  . Essential hypertension, benign   . Hyperlipidemia   . Mitral regurgitation     Status post bioprosthetic MVR (05/2013)  . CKD (chronic kidney disease) stage 4, GFR 15-29 ml/min   . Paroxysmal atrial fibrillation     s/p MAZE (05/2013)  . Chronic diastolic congestive heart failure     a) ECHO (09/2013) EF 50-55%  . Arthritis   . Aortic insufficiency due to bicuspid aortic valve     Moderate by TEE  . 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 S/P Maze operation for atrial  fibrillation 05/16/2013    Complete bilateral atrial lesion set using cryothermy and bipolar radiofrequency ablation with oversewing of LA appendage  . NICM (nonischemic cardiomyopathy)     a) LHC (04/2013) no significant CAD     Allergies  Allergen Reactions  . Indomethacin Other (See Comments)    dizziness  . Norvasc [Amlodipine Besylate] Cough     Current Outpatient Prescriptions  Medication Sig Dispense Refill  . amiodarone (PACERONE) 200 MG tablet Take 200 mg by mouth daily.     . colchicine 0.6 MG tablet Take 0.5 tablets (0.3 mg total) by mouth daily. 30 tablet 0  . cyanocobalamin (,VITAMIN B-12,) 1000 MCG/ML injection Inject 1 mL into the muscle every 30 (thirty) days.  12  . cyclobenzaprine (FLEXERIL) 10 MG tablet Take 10 mg by mouth 3 (three) times daily as needed for muscle spasms.    Marland Kitchen HYDROcodone-acetaminophen (NORCO/VICODIN) 5-325 MG per tablet Take 1 tablet by mouth 4 (four) times daily as needed for moderate pain.     Marland Kitchen levothyroxine (SYNTHROID, LEVOTHROID) 50 MCG tablet Take 50 mcg by mouth daily before breakfast.    . metolazone (ZAROXOLYN) 2.5 MG tablet Take 1 tablet (2.5 mg total) by mouth as directed. TAKE 1 TABLET AS NEEDED ONLY IF YOUR WEIGHT INCREASES 2-3 POUNDS IN 24 HOURS. (Patient taking  differently: Take 2.5 mg by mouth daily. Marland Kitchen)    . omeprazole (PRILOSEC) 20 MG capsule Take 1 capsule (20 mg total) by mouth daily. Medicine to help protect your stomach lining from ulcers while on coumadin 30 capsule 3  . potassium chloride SA (K-DUR,KLOR-CON) 20 MEQ tablet Take 1 tablet (20 mEq total) by mouth daily. 60 tablet 6  . simvastatin (ZOCOR) 10 MG tablet TAKE ONE TABLET BY MOUTH EVERY EVENING. 30 tablet 3  . tolterodine (DETROL LA) 4 MG 24 hr capsule Take 4 mg by mouth daily.    Marland Kitchen torsemide (DEMADEX) 100 MG tablet STARTING TOMORROW, TAKE A HALF A TABLET DAILY FOR 3 DAYS AND THEN RESTART ONE TABLET DAILY THEREAFTER. (Patient taking differently: Take 100 mg by mouth every  morning. )    . warfarin (COUMADIN) 2.5 MG tablet Take 1.25-2.5 mg by mouth daily at 6 PM. Take 1 tab (2.5mg ) on Tues, Thurs, Sat, Sun and take 1/2 tab (1.25mg ) on Mon, Wed, Fri     No current facility-administered medications for this visit.     Past Surgical History  Procedure Laterality Date  . Total knee arthroplasty Right   . Abdominal hysterectomy      Cervical Cancer  . Parathyroid/thyroid surgery      Tumor  . Tee without cardioversion N/A 04/06/2013    Procedure: TRANSESOPHAGEAL ECHOCARDIOGRAM (TEE);  Surgeon: [name], MD;  [facility];  Service: Cardiology;  Laterality: N/A;  . Minimally invasive maze procedure N/A 05/16/2013    Procedure: MINIMALLY INVASIVE MAZE PROCEDURE;  Surgeon: Rexene Alberts, MD;  Location: [place];  Service: Open Heart Surgery;  Laterality: N/A;  . Intraoperative transesophageal echocardiogram N/A 05/16/2013    Procedure: INTRAOPERATIVE TRANSESOPHAGEAL ECHOCARDIOGRAM;  Surgeon: Rexene Alberts, MD;  Location: [place];  Service: Open Heart Surgery;  Laterality: N/A;  . Mitral valve replacement Right 05/16/2013    Procedure: MINIMALLY INVASIVE MITRAL VALVE (MV) REPLACEMENT;  Surgeon: Rexene Alberts, MD;  Location: [place];  Service: Open Heart Surgery;  Laterality: Right;  . Tee without cardioversion N/A 06/06/2013    Procedure: TRANSESOPHAGEAL ECHOCARDIOGRAM (TEE);  Surgeon: Larey Dresser, MD;  Location: [place];  Service: Cardiovascular;  Laterality: N/A;  . Cardioversion N/A 06/06/2013    Procedure: CARDIOVERSION;  Surgeon: Larey Dresser, MD;  Location: [facility] ENDOSCOPY;  Service: Cardiovascular;  Laterality: N/A;  . Left and right heart catheterization with coronary angiogram N/A 05/04/2013    Procedure: LEFT AND RIGHT HEART CATHETERIZATION WITH CORONARY ANGIOGRAM;  Surgeon: Jettie Booze, MD;  Location: [facility] CATH LAB;  Service: Cardiovascular;  Laterality: N/A;     Allergies  Allergen Reactions  . Indomethacin Other (See  Comments)    dizziness  . Norvasc [Amlodipine Besylate] Cough      Family History  Problem Relation Age of Onset  . Heart failure Father   . Heart attack Brother      Social History Ms. Kincannon reports that she has never smoked. She has never used smokeless tobacco. Ms. Waguespack reports that she does not drink alcohol.   Review of Systems CONSTITUTIONAL: No weight loss, fever, chills, weakness or fatigue.  HEENT: Eyes: No visual loss, blurred vision, double vision or yellow sclerae.No hearing loss, sneezing, congestion, runny nose or sore throat.  SKIN: No rash or itching.  CARDIOVASCULAR: per HPI RESPIRATORY: No shortness of breath, cough or sputum.  GASTROINTESTINAL: No anorexia, nausea, vomiting or diarrhea. No abdominal pain or blood.  GENITOURINARY: No burning on  urination, no polyuria NEUROLOGICAL: No headache, dizziness, syncope, paralysis, ataxia, numbness or tingling in the extremities. No change in bowel or bladder control.  MUSCULOSKELETAL: No muscle, back pain, joint pain or stiffness.  LYMPHATICS: No enlarged nodes. No history of splenectomy.  PSYCHIATRIC: No history of depression or anxiety.  ENDOCRINOLOGIC: No reports of sweating, cold or heat intolerance. No polyuria or polydipsia.  Marland Kitchen   Physical Examination Filed Vitals:   08/01/14 1032  BP: 136/68  Pulse: 69   Filed Vitals:   08/01/14 1032  Height: 5\' 2"  (1.575 m)  Weight: 150 lb (68.04 kg)    Gen: resting comfortably, no acute distress HEENT: no scleral icterus, pupils equal round and reactive, no palptable cervical adenopathy,  CV: RRR, no m/r/g, no JVD Resp: Clear to auscultation bilaterally GI: abdomen is soft, non-tender, non-distended, normal bowel sounds, no hepatosplenomegaly MSK: extremities are warm, no edema.  Skin: warm, no rash Neuro:  no focal deficits Psych: appropriate affect   Diagnostic Studies 06/05/13 Echo  Procedure narrative: Transthoracic echocardiography.  Poor endocardial definition. Intravenous contrast (Definity) was administered. - Left ventricle: The cavity size was normal. Wall thickness was normal. Systolic function was mildly to moderately reduced. The estimated ejection fraction was in the range of 40% to 45%. LV diastolic function cannot be assessed due to the prosthetic mitral valve. - Aortic valve: Poorly visualized. Mildly calcified leaflets. Transvalvular velocity was minimally increased. Mild regurgitation. - Mitral valve: Bioprosthetic mitral valve. Leaflets not well-visualized. Appears to be stable. Peak and mean gradients of 14 and 6 mmHg across the valve. Trivial regurgitation. Valve area by continuity equation (using LVOT flow): 1.33cm^2. - Right ventricle: The cavity size was mildly dilated. Systolic function is mildly reduced. - Right atrium: The atrium was normal in size. - Tricuspid valve: Mild regurgitation. - Pulmonary arteries: PA peak pressure: 68mm Hg (S). - Inferior vena cava: The vessel was dilated; the respirophasic diameter changes were blunted (< 50%); findings are consistent with elevated central venous pressure. - Pericardium, extracardiac: There was no pericardial effusion.  06/06/13 TEE  Study Conclusions  - Left ventricle: The cavity size was normal. Wall thickness was increased in a pattern of mild LVH. The estimated ejection fraction was 45%. Diffuse hypokinesis. - Aortic valve: Bicuspid. There was no stenosis. Mild to moderate regurgitation. - Mitral valve: Bioprosthetic mitral valve with trivial regurgitation and no significant stenosis. Pressure half-time: 26ms. - Left atrium: Ligated at prior surgery but there was still some flow into the appendage. No thrombus noted in appendage. The atrium was mildly dilated. - Right ventricle: The cavity size was normal. Systolic function was mildly reduced. - Right atrium: No evidence of thrombus in the atrial cavity or appendage. - Tricuspid  valve: Moderate regurgitation. Peak RV-RA gradient: 8mm Hg (S). Impressions:  - May proceed to DCCV.        Assessment and Plan  1. Mitral regurigation - s/p MVR with tissue valve. - no current symptoms, continue current meds  2. Chronic systolic/diastolic heart failure - medical therapy limited due to low blood pressures, not on beta blocker. No ACE due to severe CKD - appears euvolemic, continue current diuretic.   3. Afib - no current symptoms. Continue amio and coumadin. Will check TSH and liver labs on amio  4. CKD IV - will repeat labs    F/u 6 months  [name], M.D.

## 2014-08-08 ENCOUNTER — Ambulatory Visit: Payer: Commercial Managed Care - HMO

## 2014-08-08 ENCOUNTER — Other Ambulatory Visit: Payer: Self-pay

## 2014-08-08 NOTE — Patient Outreach (Signed)
[facility]) Care Management  08/08/2014  [name] [name] 09-24-1940 [id number]   Telephone call to patient for disease management follow up.  Unable to reach.  States voicemail not set up.     Plan: RN Health Coach will attempt telephone outreach within 1 week.    [name], RN, MSN [place] [phone]

## 2014-08-12 ENCOUNTER — Other Ambulatory Visit: Payer: Self-pay

## 2014-08-12 ENCOUNTER — Ambulatory Visit: Payer: Commercial Managed Care - HMO

## 2014-08-12 NOTE — Patient Outreach (Signed)
[name]) Care Management  08/12/2014  [name] [name] [birth year] [id number]   Telephone call to patient for monthly outreach.  No answer unable to leave voice mail as it states voice mail not set up.    Plan: RN Health Coach will attempt outreach within the next week.    [name], RN, MSN [place] [phone]

## 2014-08-13 ENCOUNTER — Ambulatory Visit (INDEPENDENT_AMBULATORY_CARE_PROVIDER_SITE_OTHER): Payer: Commercial Managed Care - HMO | Admitting: *Deleted

## 2014-08-13 DIAGNOSIS — Z8679 Personal history of other diseases of the circulatory system: Secondary | ICD-10-CM

## 2014-08-13 DIAGNOSIS — I639 Cerebral infarction, unspecified: Secondary | ICD-10-CM | POA: Diagnosis not present

## 2014-08-13 DIAGNOSIS — Z9889 Other specified postprocedural states: Secondary | ICD-10-CM

## 2014-08-13 DIAGNOSIS — Z953 Presence of xenogenic heart valve: Secondary | ICD-10-CM

## 2014-08-13 DIAGNOSIS — I5043 Acute on chronic combined systolic (congestive) and diastolic (congestive) heart failure: Secondary | ICD-10-CM | POA: Diagnosis not present

## 2014-08-13 DIAGNOSIS — R5383 Other fatigue: Secondary | ICD-10-CM | POA: Diagnosis not present

## 2014-08-13 DIAGNOSIS — E780 Pure hypercholesterolemia: Secondary | ICD-10-CM | POA: Diagnosis not present

## 2014-08-13 DIAGNOSIS — I5033 Acute on chronic diastolic (congestive) heart failure: Secondary | ICD-10-CM | POA: Diagnosis not present

## 2014-08-13 DIAGNOSIS — I10 Essential (primary) hypertension: Secondary | ICD-10-CM | POA: Diagnosis not present

## 2014-08-13 DIAGNOSIS — Z7901 Long term (current) use of anticoagulants: Secondary | ICD-10-CM | POA: Diagnosis not present

## 2014-08-13 DIAGNOSIS — I4891 Unspecified atrial fibrillation: Secondary | ICD-10-CM

## 2014-08-13 LAB — POCT INR: INR: 3.4

## 2014-08-19 ENCOUNTER — Other Ambulatory Visit: Payer: Self-pay

## 2014-08-19 ENCOUNTER — Other Ambulatory Visit: Payer: Self-pay | Admitting: Licensed Clinical Social Worker

## 2014-08-19 NOTE — Patient Outreach (Signed)
[facility]) Care Management  [place]  08/19/2014   [name] [name] Nov 03, 1940 [id number]  Subjective:  Telephone call to patient she reports she is doing good.  Patient reports being out and active visiting with friends at nursing home etc.  Patient reports weight today was 156.  Patient reports she is trying to stay away from salt and states she is green heart failure zone today.  Patient ambulates with walker( Rollator) and still drives.  Patient reports she is to see the eye doctor this month for an exam.  Patient reports she has her medicaid back and has some questions about getting her depends back.  Advised that health coach would have SW get in contact with her.  She verbalized understanding.      Current Medications:  Current Outpatient Prescriptions  Medication Sig Dispense Refill  . amiodarone (PACERONE) 200 MG tablet Take 100 mg by mouth daily.     . colchicine 0.6 MG tablet Take 0.5 tablets (0.3 mg total) by mouth daily. 30 tablet 0  . cyanocobalamin (,VITAMIN B-12,) 1000 MCG/ML injection Inject 1 mL into the muscle every 30 (thirty) days.  12  . ferrous sulfate 325 (65 FE) MG tablet Take 325 mg by mouth 2 (two) times daily with a meal.    . levothyroxine (SYNTHROID, LEVOTHROID) 50 MCG tablet Take 50 mcg by mouth daily before breakfast.    . metolazone (ZAROXOLYN) 2.5 MG tablet Take 1 tablet (2.5 mg total) by mouth as directed. TAKE 1 TABLET AS NEEDED ONLY IF YOUR WEIGHT INCREASES 2-3 POUNDS IN 24 HOURS. (Patient taking differently: Take 2.5 mg by mouth daily. Marland Kitchen)    . omeprazole (PRILOSEC) 20 MG capsule Take 1 capsule (20 mg total) by mouth daily. Medicine to help protect your stomach lining from ulcers while on coumadin 30 capsule 3  . potassium chloride SA (K-DUR,KLOR-CON) 20 MEQ tablet Take 1 tablet (20 mEq total) by mouth daily. 60 tablet 6  . simvastatin (ZOCOR) 10 MG tablet TAKE ONE TABLET BY MOUTH EVERY EVENING. 30 tablet 3  . tolterodine  (DETROL LA) 4 MG 24 hr capsule Take 4 mg by mouth daily.    Marland Kitchen torsemide (DEMADEX) 100 MG tablet STARTING TOMORROW, TAKE A HALF A TABLET DAILY FOR 3 DAYS AND THEN RESTART ONE TABLET DAILY THEREAFTER. (Patient taking differently: Take 100 mg by mouth every morning. )    . warfarin (COUMADIN) 2.5 MG tablet Take 1.25-2.5 mg by mouth daily at 6 PM. As directed per Lattie Haw     No current facility-administered medications for this visit.    Functional Status:  In your present state of health, do you have any difficulty performing the following activities: 08/19/2014 06/07/2014  Hearing? N Y  Vision? Y N  Difficulty concentrating or making decisions? N N  Walking or climbing stairs? Y Y  Dressing or bathing? Y Y  Doing errands, shopping? [name]  Preparing Food and eating ? N N  Using the Toilet? N N  In the past six months, have you accidently leaked urine? Y Y  Do you have problems with loss of bowel control? N N  Managing your Medications? Y Y  Managing your Finances? Y N  Housekeeping or managing your Housekeeping? [name]    Fall/Depression Screening: PHQ 2/9 Scores 08/19/2014 06/07/2014  PHQ - 2 Score 0 0   TH[facility] CM Care Plan Problem One        Patient Outreach Telephone from 08/19/2014 in [employer]  [facility]   Care Plan Problem One  knowledge deficit related to Heart Failure clinic protocols   Care Plan for Problem One  Active   [facility]N CM Short Term Goal #1 (0-30 days)  pt and family with verbalize understanding of prescribed medications for heart failure within 30 days.   [facility]N CM Short Term Goal #1 Met Date  06/07/14   [facility] CM Short Term Goal #2 (0-30 days)  Patient will be able to verbalize heart failure zones within 30 days.     THN CM Short Term Goal #2 Start Date  08/19/14   Interventions for Short Term Goal #2  Reviewed with patient green and yellow zones of heart failure. Also discussed low salt diet with patient.      [place] Plan Problem Two        Patient Outreach Telephone from  08/19/2014 in [place] Problem Two  Decreased endurance related to disease process CHF [From [facility] system]   Care Plan for Problem Two  Not Active   THN CM Short Term Goal #1 (0-30 days)  Pt will work with physical therapist for increased endurance and be able to lift legs within 30 days   THN CM Short Term Goal #1 Met Date   07/09/14    [facility] CM Care Plan Problem Three        Patient Outreach Telephone from 08/19/2014 in [place] Problem Three  Patient unable to perform activities of daily living  independently, needs home care services/support assistance   Care Plan for Problem Three  Active   THN CM Short Term Goal #1 (0-30 days)    Client to cooperate with in home care service providers assisting client with activities of daily living       Assessment: Patient will continue to benefit from health coach monthly calls for education and reminders for self management of heart failure.     Plan: RN Health Coach will contact patient within one month and patient agrees to next outreach.  RN Health Coach will also send update to primary physician.    [name], RN, MSN [place] [phone]

## 2014-08-19 NOTE — Patient Outreach (Signed)
Assessment: CSW called client on 08/19/14 and spoke via phone with client on 08/19/14.   CSW verified identity of client.  Client and CSW spoke of client's current status related to Medicaid eligibility.  Client said she had received letter recently stating she would receive a $243.00 refund.  Client was not sure of the source of this letter.  Client had questions about her Medicaid eligibility.  Client has received Medicaid benefit in the past; however, her daughter, [name], informed CSW in April of 2016 that client was no longer receiving Medicaid.  CSW informed client that there were several types of Medicaid benefits.  CSW encouraged client to call her Medicaid caseworker at [facility] in [place], [place] to ask the [facility] caseworker client's questions regarding client's Medicaid eligibility. Client said she would call her Medicaid caseworker soon to speak with her about Medicaid questions of client.   Plan:  Client to take medications as prescribed and to attend scheduled medical appointments. Client to cooperate with in home care providers for client. CSW to call client in three weeks to assess needs of client.  [name].Disha Cottam MSW, LCSW Licensed Clinical Social Worker [facility] Care Management [phone]

## 2014-08-20 DIAGNOSIS — R5383 Other fatigue: Secondary | ICD-10-CM | POA: Diagnosis not present

## 2014-08-20 DIAGNOSIS — E876 Hypokalemia: Secondary | ICD-10-CM | POA: Diagnosis not present

## 2014-08-20 DIAGNOSIS — I10 Essential (primary) hypertension: Secondary | ICD-10-CM | POA: Diagnosis not present

## 2014-08-20 DIAGNOSIS — I5022 Chronic systolic (congestive) heart failure: Secondary | ICD-10-CM | POA: Diagnosis not present

## 2014-08-20 DIAGNOSIS — N184 Chronic kidney disease, stage 4 (severe): Secondary | ICD-10-CM | POA: Diagnosis not present

## 2014-08-20 DIAGNOSIS — I482 Chronic atrial fibrillation: Secondary | ICD-10-CM | POA: Diagnosis not present

## 2014-08-20 DIAGNOSIS — D649 Anemia, unspecified: Secondary | ICD-10-CM | POA: Diagnosis not present

## 2014-08-20 DIAGNOSIS — R32 Unspecified urinary incontinence: Secondary | ICD-10-CM | POA: Diagnosis not present

## 2014-08-21 DIAGNOSIS — I10 Essential (primary) hypertension: Secondary | ICD-10-CM | POA: Diagnosis not present

## 2014-08-22 DIAGNOSIS — I509 Heart failure, unspecified: Secondary | ICD-10-CM | POA: Diagnosis not present

## 2014-08-29 DIAGNOSIS — R531 Weakness: Secondary | ICD-10-CM | POA: Diagnosis not present

## 2014-09-02 DIAGNOSIS — Z0100 Encounter for examination of eyes and vision without abnormal findings: Secondary | ICD-10-CM | POA: Diagnosis not present

## 2014-09-02 DIAGNOSIS — H2510 Age-related nuclear cataract, unspecified eye: Secondary | ICD-10-CM | POA: Diagnosis not present

## 2014-09-05 DIAGNOSIS — D519 Vitamin B12 deficiency anemia, unspecified: Secondary | ICD-10-CM | POA: Diagnosis not present

## 2014-09-10 ENCOUNTER — Ambulatory Visit (INDEPENDENT_AMBULATORY_CARE_PROVIDER_SITE_OTHER): Payer: Commercial Managed Care - HMO | Admitting: *Deleted

## 2014-09-10 DIAGNOSIS — I5043 Acute on chronic combined systolic (congestive) and diastolic (congestive) heart failure: Secondary | ICD-10-CM | POA: Diagnosis not present

## 2014-09-10 DIAGNOSIS — I639 Cerebral infarction, unspecified: Secondary | ICD-10-CM

## 2014-09-10 DIAGNOSIS — I4891 Unspecified atrial fibrillation: Secondary | ICD-10-CM

## 2014-09-10 DIAGNOSIS — Z9889 Other specified postprocedural states: Secondary | ICD-10-CM

## 2014-09-10 DIAGNOSIS — I5033 Acute on chronic diastolic (congestive) heart failure: Secondary | ICD-10-CM | POA: Diagnosis not present

## 2014-09-10 DIAGNOSIS — Z953 Presence of xenogenic heart valve: Secondary | ICD-10-CM

## 2014-09-10 DIAGNOSIS — Z8679 Personal history of other diseases of the circulatory system: Secondary | ICD-10-CM

## 2014-09-10 DIAGNOSIS — Z7901 Long term (current) use of anticoagulants: Secondary | ICD-10-CM

## 2014-09-10 LAB — POCT INR: INR: 2.9

## 2014-09-16 ENCOUNTER — Ambulatory Visit: Payer: Commercial Managed Care - HMO

## 2014-09-16 ENCOUNTER — Other Ambulatory Visit: Payer: Self-pay | Admitting: Licensed Clinical Social Worker

## 2014-09-16 ENCOUNTER — Other Ambulatory Visit: Payer: Self-pay

## 2014-09-16 NOTE — Patient Outreach (Signed)
[facility]) Care Management  09/16/2014  [name] [name] 1941-01-21 [id number]  Telephone call to patient for disease management follow up.  Unable to reach.  HIPPA compliant voice message left.  Plan: RN Health Coach will attempt telephone outreach within 1 week.    [name], RN, MSN [place] [phone]

## 2014-09-16 NOTE — Patient Outreach (Signed)
Assessment:  CSW called client home phone number on 09/16/14.  CSW verified identity of client. CSW spoke with client via phone on 09/16/14 to discuss current needs of client.  Client said that she has partial Medicaid and it only covers payment of her Medicare premium each month.  She said she has [employer]. She receives her prescribed medications from [facility] in [place], [place].  She said it is difficult to pay for the briefs she needs each month.  She said she pays for monthly briefs at a cost of about $35.00 per month.  She said she has no family members who can help with this expense.  She said she has a new lift chair that her sister purchased for client; client said lift chair works well.  Client said she has a rollator walker to use to help her ambulate.  She said she has adequate food supply.  She said she goes periodically to local food pantry to obtain needed staple food items.  She said she has her prescribed medications.  She said her family helped transport her to and from  medical appointments.  CSW informed client of [employer] [facility] [employer] agency in [place], [place] (phone number: [phone]).  CSW encouraged [place] to call that agency to seek food support/assist as needed and also to talk with agency representative about other helping services available through that agency.  [place] was appreciative of phone call by CSW on 09/16/14.   Plan: Client to take medications as prescribed and to attend scheduled medical appointments.  Client to communicate with her primary doctor as needed to discuss medical needs of client. CSW to call client in 4 weeks to assess needs of client at that time.  [name].Zakariah Urwin MSW, LCSW Licensed Clinical Social Worker [facility] Care Management [phone]

## 2014-09-20 DIAGNOSIS — D519 Vitamin B12 deficiency anemia, unspecified: Secondary | ICD-10-CM | POA: Diagnosis not present

## 2014-09-21 DIAGNOSIS — I509 Heart failure, unspecified: Secondary | ICD-10-CM | POA: Diagnosis not present

## 2014-09-23 ENCOUNTER — Other Ambulatory Visit: Payer: Self-pay

## 2014-09-23 NOTE — Patient Outreach (Signed)
[place] [facility]) Care Management  09/23/2014  GRENDA LORA Mar 23, 1940 [id number]   Telephone call to patient for monthly outreach. Patient reports she is doing fine.  Saw PCP about two weeks ago.  Patient abruptly hung telephone up. Attempted patient again but no answer.    Plan: RN Health Coach will attempt patient within the next 1-2 weeks for outreach.  [name], RN, MSN [place] [phone]

## 2014-09-28 DIAGNOSIS — R531 Weakness: Secondary | ICD-10-CM | POA: Diagnosis not present

## 2014-09-30 ENCOUNTER — Ambulatory Visit: Payer: Commercial Managed Care - HMO

## 2014-09-30 ENCOUNTER — Other Ambulatory Visit: Payer: Self-pay

## 2014-09-30 NOTE — Patient Outreach (Signed)
[place] [facility]) Care Management  09/30/2014  [name] [name] [birth year] [id number]   Telephone call for monthly outreach. No answer.  States voicemail not set up.  Plan: RN Health Coach will attempt patient within 1-2 weeks.   [name], RN, MSN [place] [phone]

## 2014-10-03 ENCOUNTER — Encounter: Payer: Self-pay | Admitting: Licensed Clinical Social Worker

## 2014-10-03 ENCOUNTER — Other Ambulatory Visit: Payer: Self-pay | Admitting: Licensed Clinical Social Worker

## 2014-10-03 NOTE — Patient Outreach (Signed)
Assessment: CSW completed chart review on client on 10/03/14.  CSW called home phone number of client on 10/03/14.  CSW verified identity of client.  CSW spoke via phone with client on 10/03/14.  CSW informed client of company called [employer] at phone number [phone] (fax number of [phone]). CSW informed client that [employer] sometimes does work in [employer] (once proper doctor's orders are received) related to pullups or diapers needed for clients related to incontinency issues of such clients. CSW encouraged Fraya to call [employer] company at [phone] to inquire about possible resources for client through this company.  [place] informed CSW that she has partial Medicaid and not full Medicaid benefit.  CSW and client spoke of food supply for client. She said she has adequate food supply. She said she has been in communication with local food pantry in past 30 days.  She has all equipment needed. She has all medications prescribed. She has met her social work goals.  CSW congratulated client on meeting her social work goals and Building surveyor for working well with [place] support over the past months. CSW informed client on 10/03/14 that [place] would discharge client from [place] services on 10/03/14 since social work goals for client had been met. Client will continue to receive [facility] support through [name], [facility] RN HealthCoach.  Client agreed to this plan.  CSW thanked client for phone conversation on 10/03/14  Client said she had enjoyed working with [place] during recent months and thought support of [facility] CSW had been helpful to her.  Plan: CSW is discharging Kamarah Bilotta from [place] services on 10/03/14 since social work goals for client have been met. CSW to communicate with Lurline Del on 10/03/14 to inform Lattie Haw that [place] discharged client on 10/03/14. CSW sent letter to Dr. Quintin Alto on 10/03/14 informing Dr. Quintin Alto that [place] discharged client on 10/03/14 due to social work goals  for client were met..In letter to Dr. Quintin Alto, [place] informed Dr. Quintin Alto that [place] will continue to provide [facility] telephonic support for client.     [name].Ravis Herne MSW, LCSW Licensed Clinical Social Worker [facility] Care Management [phone]

## 2014-10-08 ENCOUNTER — Ambulatory Visit (INDEPENDENT_AMBULATORY_CARE_PROVIDER_SITE_OTHER): Payer: Commercial Managed Care - HMO | Admitting: *Deleted

## 2014-10-08 DIAGNOSIS — Z9889 Other specified postprocedural states: Secondary | ICD-10-CM

## 2014-10-08 DIAGNOSIS — Z8679 Personal history of other diseases of the circulatory system: Secondary | ICD-10-CM

## 2014-10-08 DIAGNOSIS — I4891 Unspecified atrial fibrillation: Secondary | ICD-10-CM

## 2014-10-08 DIAGNOSIS — I5043 Acute on chronic combined systolic (congestive) and diastolic (congestive) heart failure: Secondary | ICD-10-CM

## 2014-10-08 DIAGNOSIS — Z953 Presence of xenogenic heart valve: Secondary | ICD-10-CM | POA: Diagnosis not present

## 2014-10-08 DIAGNOSIS — I5033 Acute on chronic diastolic (congestive) heart failure: Secondary | ICD-10-CM | POA: Diagnosis not present

## 2014-10-08 DIAGNOSIS — Z7901 Long term (current) use of anticoagulants: Secondary | ICD-10-CM | POA: Diagnosis not present

## 2014-10-08 DIAGNOSIS — I639 Cerebral infarction, unspecified: Secondary | ICD-10-CM

## 2014-10-08 LAB — POCT INR: INR: 2.2

## 2014-10-10 ENCOUNTER — Other Ambulatory Visit: Payer: Self-pay | Admitting: *Deleted

## 2014-10-10 MED ORDER — AMIODARONE HCL 200 MG PO TABS: 100.0000 mg | ORAL_TABLET | Freq: Every day | ORAL | Status: AC

## 2014-10-11 ENCOUNTER — Ambulatory Visit: Payer: Commercial Managed Care - HMO

## 2014-10-11 ENCOUNTER — Other Ambulatory Visit: Payer: Self-pay

## 2014-10-11 NOTE — Patient Outreach (Signed)
[place] [facility]) Care Management  10/11/2014  DARCEY CARDY 1940-03-22 [id number]   Telephone call to patient for disease management follow up.  Unable to reach.  Telephone states voicemail not set up.  Plan: RN Health Coach will attempt telephone outreach within 1 week.    [name], RN, MSN [place] [phone]

## 2014-10-18 ENCOUNTER — Other Ambulatory Visit: Payer: Self-pay

## 2014-10-18 ENCOUNTER — Ambulatory Visit: Payer: Commercial Managed Care - HMO

## 2014-10-18 DIAGNOSIS — I509 Heart failure, unspecified: Secondary | ICD-10-CM

## 2014-10-18 NOTE — Patient Outreach (Signed)
[place] [facility]) Care Management  10/18/2014  YAILIN BIEDERMAN 12-19-1940 [id number]   Third telephone call to patient for monthly outreach.  No answer.  States voicemail not set up.      Plan: RN will send outreach letter to attempt contact.    [name], RN, MSN [place] [phone]

## 2014-10-21 DIAGNOSIS — D519 Vitamin B12 deficiency anemia, unspecified: Secondary | ICD-10-CM | POA: Diagnosis not present

## 2014-10-22 DIAGNOSIS — I509 Heart failure, unspecified: Secondary | ICD-10-CM | POA: Diagnosis not present

## 2014-10-29 DIAGNOSIS — R531 Weakness: Secondary | ICD-10-CM | POA: Diagnosis not present

## 2014-11-02 ENCOUNTER — Encounter (HOSPITAL_COMMUNITY): Payer: Self-pay | Admitting: Emergency Medicine

## 2014-11-02 ENCOUNTER — Emergency Department (HOSPITAL_COMMUNITY): Payer: Commercial Managed Care - HMO

## 2014-11-02 ENCOUNTER — Inpatient Hospital Stay (HOSPITAL_COMMUNITY)
Admission: EM | Admit: 2014-11-02 | Discharge: 2014-11-04 | DRG: 291 | Disposition: A | Discharge status: Home / Self Care | Payer: Commercial Managed Care - HMO | Attending: Internal Medicine | Admitting: Internal Medicine

## 2014-11-02 DIAGNOSIS — E785 Hyperlipidemia, unspecified: Secondary | ICD-10-CM | POA: Diagnosis present

## 2014-11-02 DIAGNOSIS — R0602 Shortness of breath: Secondary | ICD-10-CM | POA: Diagnosis present

## 2014-11-02 DIAGNOSIS — Z9981 Dependence on supplemental oxygen: Secondary | ICD-10-CM | POA: Diagnosis not present

## 2014-11-02 DIAGNOSIS — Z8673 Personal history of transient ischemic attack (TIA), and cerebral infarction without residual deficits: Secondary | ICD-10-CM

## 2014-11-02 DIAGNOSIS — I480 Paroxysmal atrial fibrillation: Secondary | ICD-10-CM | POA: Diagnosis present

## 2014-11-02 DIAGNOSIS — Z8249 Family history of ischemic heart disease and other diseases of the circulatory system: Secondary | ICD-10-CM | POA: Diagnosis not present

## 2014-11-02 DIAGNOSIS — Z8541 Personal history of malignant neoplasm of cervix uteri: Secondary | ICD-10-CM

## 2014-11-02 DIAGNOSIS — E039 Hypothyroidism, unspecified: Secondary | ICD-10-CM | POA: Diagnosis present

## 2014-11-02 DIAGNOSIS — R0902 Hypoxemia: Secondary | ICD-10-CM | POA: Diagnosis not present

## 2014-11-02 DIAGNOSIS — Z96651 Presence of right artificial knee joint: Secondary | ICD-10-CM | POA: Diagnosis present

## 2014-11-02 DIAGNOSIS — Z7901 Long term (current) use of anticoagulants: Secondary | ICD-10-CM

## 2014-11-02 DIAGNOSIS — I4821 Permanent atrial fibrillation: Secondary | ICD-10-CM | POA: Diagnosis present

## 2014-11-02 DIAGNOSIS — N184 Chronic kidney disease, stage 4 (severe): Secondary | ICD-10-CM | POA: Diagnosis not present

## 2014-11-02 DIAGNOSIS — J449 Chronic obstructive pulmonary disease, unspecified: Secondary | ICD-10-CM | POA: Diagnosis not present

## 2014-11-02 DIAGNOSIS — Z953 Presence of xenogenic heart valve: Secondary | ICD-10-CM | POA: Diagnosis not present

## 2014-11-02 DIAGNOSIS — I10 Essential (primary) hypertension: Secondary | ICD-10-CM | POA: Diagnosis not present

## 2014-11-02 DIAGNOSIS — R32 Unspecified urinary incontinence: Secondary | ICD-10-CM | POA: Diagnosis present

## 2014-11-02 DIAGNOSIS — I5033 Acute on chronic diastolic (congestive) heart failure: Secondary | ICD-10-CM | POA: Diagnosis not present

## 2014-11-02 DIAGNOSIS — J189 Pneumonia, unspecified organism: Secondary | ICD-10-CM

## 2014-11-02 DIAGNOSIS — I129 Hypertensive chronic kidney disease with stage 1 through stage 4 chronic kidney disease, or unspecified chronic kidney disease: Secondary | ICD-10-CM | POA: Diagnosis present

## 2014-11-02 DIAGNOSIS — Z952 Presence of prosthetic heart valve: Secondary | ICD-10-CM

## 2014-11-02 DIAGNOSIS — I509 Heart failure, unspecified: Secondary | ICD-10-CM | POA: Diagnosis not present

## 2014-11-02 LAB — CBC WITH DIFFERENTIAL/PLATELET
Basophils Absolute: 0 10*3/uL (ref 0.0–0.1)
Basophils Relative: 0 % (ref 0–1)
EOS ABS: 0.3 10*3/uL (ref 0.0–0.7)
EOS PCT: 5 % (ref 0–5)
HCT: 34.1 % — ABNORMAL LOW (ref 36.0–46.0)
HEMOGLOBIN: 10.9 g/dL — AB (ref 12.0–15.0)
LYMPHS ABS: 0.8 10*3/uL (ref 0.7–4.0)
LYMPHS PCT: 17 % (ref 12–46)
MCH: 31 pg (ref 26.0–34.0)
MCHC: 32 g/dL (ref 30.0–36.0)
MCV: 96.9 fL (ref 78.0–100.0)
MONOS PCT: 12 % (ref 3–12)
Monocytes Absolute: 0.6 10*3/uL (ref 0.1–1.0)
Neutro Abs: 3.2 10*3/uL (ref 1.7–7.7)
Neutrophils Relative %: 66 % (ref 43–77)
PLATELETS: 158 10*3/uL (ref 150–400)
RBC: 3.52 MIL/uL — AB (ref 3.87–5.11)
RDW: 14.1 % (ref 11.5–15.5)
WBC: 4.9 10*3/uL (ref 4.0–10.5)

## 2014-11-02 LAB — BASIC METABOLIC PANEL
Anion gap: 6 (ref 5–15)
BUN: 21 mg/dL — AB (ref 6–20)
CHLORIDE: 102 mmol/L (ref 101–111)
CO2: 34 mmol/L — ABNORMAL HIGH (ref 22–32)
CREATININE: 1.59 mg/dL — AB (ref 0.44–1.00)
Calcium: 9 mg/dL (ref 8.9–10.3)
GFR calc Af Amer: 36 mL/min — ABNORMAL LOW (ref >60)
GFR calc non Af Amer: 31 mL/min — ABNORMAL LOW (ref >60)
GLUCOSE: 98 mg/dL (ref 65–99)
POTASSIUM: 3.5 mmol/L (ref 3.5–5.1)
SODIUM: 142 mmol/L (ref 135–145)

## 2014-11-02 LAB — BRAIN NATRIURETIC PEPTIDE: B Natriuretic Peptide: 1152 pg/mL — ABNORMAL HIGH (ref 0.0–100.0)

## 2014-11-02 LAB — PROTIME-INR
INR: 2.41 — AB (ref 0.00–1.49)
PROTHROMBIN TIME: 26 s — AB (ref 11.6–15.2)

## 2014-11-02 LAB — TROPONIN I
TROPONIN I: 0.03 ng/mL (ref <0.031)
TROPONIN I: 0.03 ng/mL (ref <0.031)
Troponin I: 0.03 ng/mL (ref <0.031)
Troponin I: <0.03 ng/mL (ref <0.031)

## 2014-11-02 MED ORDER — ALBUTEROL SULFATE (2.5 MG/3ML) 0.083% IN NEBU: 2.5000 mg | INHALATION_SOLUTION | RESPIRATORY_TRACT | Status: DC | PRN

## 2014-11-02 MED ORDER — PANTOPRAZOLE SODIUM 40 MG PO TBEC
40.0000 mg | DELAYED_RELEASE_TABLET | Freq: Every day | ORAL | Status: DC
  Administered 2014-11-02 – 2014-11-04 (×3): 40 mg via ORAL
  Filled 2014-11-02 (×3): qty 1

## 2014-11-02 MED ORDER — ONDANSETRON HCL 4 MG/2ML IJ SOLN: 4.0000 mg | Freq: Four times a day (QID) | INTRAMUSCULAR | Status: DC | PRN

## 2014-11-02 MED ORDER — WARFARIN - PHARMACIST DOSING INPATIENT
Freq: Every day | Status: DC
  Administered 2014-11-02: 1
  Administered 2014-11-03 – 2014-11-04 (×2)

## 2014-11-02 MED ORDER — SODIUM CHLORIDE 0.9 % IJ SOLN: 3.0000 mL | INTRAMUSCULAR | Status: DC | PRN

## 2014-11-02 MED ORDER — DEXTROSE 5 % IV SOLN
1.0000 g | Freq: Once | INTRAVENOUS | Status: AC
  Administered 2014-11-02: 1 g via INTRAVENOUS
  Filled 2014-11-02: qty 10

## 2014-11-02 MED ORDER — POTASSIUM CHLORIDE CRYS ER 20 MEQ PO TBCR
20.0000 meq | EXTENDED_RELEASE_TABLET | Freq: Every day | ORAL | Status: DC
  Administered 2014-11-02 – 2014-11-04 (×3): 20 meq via ORAL
  Filled 2014-11-02 (×3): qty 1

## 2014-11-02 MED ORDER — SODIUM CHLORIDE 0.9 % IV SOLN: 250.0000 mL | INTRAVENOUS | Status: DC | PRN

## 2014-11-02 MED ORDER — SODIUM CHLORIDE 0.9 % IJ SOLN
3.0000 mL | Freq: Two times a day (BID) | INTRAMUSCULAR | Status: DC
  Administered 2014-11-02 – 2014-11-03 (×2): 3 mL via INTRAVENOUS

## 2014-11-02 MED ORDER — DEXTROSE 5 % IV SOLN
500.0000 mg | INTRAVENOUS | Status: DC
  Administered 2014-11-02 – 2014-11-04 (×3): 500 mg via INTRAVENOUS
  Filled 2014-11-02 (×4): qty 500

## 2014-11-02 MED ORDER — ENOXAPARIN SODIUM 40 MG/0.4ML ~~LOC~~ SOLN: 40.0000 mg | SUBCUTANEOUS | Status: DC

## 2014-11-02 MED ORDER — WARFARIN SODIUM 2.5 MG PO TABS
1.2500 mg | ORAL_TABLET | ORAL | Status: DC
  Administered 2014-11-02 – 2014-11-03 (×2): 1.25 mg via ORAL
  Filled 2014-11-02 (×2): qty 1

## 2014-11-02 MED ORDER — LEVOTHYROXINE SODIUM 50 MCG PO TABS
50.0000 ug | ORAL_TABLET | Freq: Every day | ORAL | Status: DC
  Administered 2014-11-02 – 2014-11-04 (×3): 50 ug via ORAL
  Filled 2014-11-02 (×3): qty 1

## 2014-11-02 MED ORDER — ACETAMINOPHEN 650 MG RE SUPP: 650.0000 mg | Freq: Four times a day (QID) | RECTAL | Status: DC | PRN

## 2014-11-02 MED ORDER — AMIODARONE HCL 200 MG PO TABS
100.0000 mg | ORAL_TABLET | Freq: Every day | ORAL | Status: DC
  Administered 2014-11-02 – 2014-11-04 (×3): 100 mg via ORAL
  Filled 2014-11-02 (×3): qty 1

## 2014-11-02 MED ORDER — DEXTROSE 5 % IV SOLN
1.0000 g | INTRAVENOUS | Status: DC
  Administered 2014-11-02 – 2014-11-04 (×3): 1 g via INTRAVENOUS
  Filled 2014-11-02 (×3): qty 10

## 2014-11-02 MED ORDER — FUROSEMIDE 10 MG/ML IJ SOLN
20.0000 mg | Freq: Two times a day (BID) | INTRAMUSCULAR | Status: DC
  Administered 2014-11-02 – 2014-11-04 (×6): 20 mg via INTRAVENOUS
  Filled 2014-11-02 (×5): qty 2

## 2014-11-02 MED ORDER — ONDANSETRON HCL 4 MG PO TABS: 4.0000 mg | ORAL_TABLET | Freq: Four times a day (QID) | ORAL | Status: DC | PRN

## 2014-11-02 MED ORDER — ACETAMINOPHEN 325 MG PO TABS: 650.0000 mg | ORAL_TABLET | Freq: Four times a day (QID) | ORAL | Status: DC | PRN

## 2014-11-02 MED ORDER — WARFARIN SODIUM 5 MG PO TABS
2.5000 mg | ORAL_TABLET | ORAL | Status: DC
  Administered 2014-11-04: 2.5 mg via ORAL
  Filled 2014-11-02: qty 1

## 2014-11-02 NOTE — Progress Notes (Signed)
ANTICOAGULATION/ANTIBIOTIC CONSULT NOTE - Initial Consult  Pharmacy Consult for COUMADIN, CEFTRIAXONE Indication: atrial fibrillation, CAP  Allergies  Allergen Reactions  . Indomethacin Other (See Comments)    dizziness  . Norvasc [Amlodipine Besylate] Cough    Patient Measurements: Height: 5\' 2"  (157.5 cm) Weight: 156 lb (70.761 kg) IBW/kg (Calculated) : 50.1   Vital Signs: Temp: 97.9 F (36.6 C) (08/27 0059) Temp Source: Oral (08/27 0059) BP: 117/64 mmHg (08/27 0530) Pulse Rate: 61 (08/27 0530)  Labs:  Recent Labs  11/02/14 0115 11/02/14 0851  HGB 10.9*  --   HCT 34.1*  --   PLT 158  --   LABPROT  --  26.0*  INR  --  2.41*  CREATININE 1.59*  --   TROPONINI 0.03  --     Estimated Creatinine Clearance: 29.1 mL/min (by C-G formula based on Cr of 1.59).   Medical History: Past Medical History  Diagnosis Date  . COPD (chronic obstructive pulmonary disease)   . History of TIA (transient ischemic attack)     Diagnosed 2003  . History of stroke     2000 [place]  . Obesity   . Juvenile rheumatic fever     Age 31  . Essential hypertension, benign   . Hyperlipidemia   . Mitral regurgitation     Status post bioprosthetic MVR (05/2013)  . CKD (chronic kidney disease) stage 4, GFR 15-29 ml/min   . Paroxysmal atrial fibrillation     s/p MAZE (05/2013)  . Chronic diastolic congestive heart failure     a) ECHO (09/2013) EF 50-55%  . Arthritis   . Aortic insufficiency due to bicuspid aortic valve     Moderate by TEE  . 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 S/P Maze operation for atrial fibrillation 05/16/2013    Complete bilateral atrial lesion set using cryothermy and bipolar radiofrequency ablation with oversewing of LA appendage  . NICM (nonischemic cardiomyopathy)     a) LHC (04/2013) no significant CAD     Medications:  Prescriptions prior to admission  Medication Sig Dispense Refill Last Dose  . amiodarone (PACERONE) 200 MG tablet Take 0.5 tablets (100 mg total) by mouth daily. 30 tablet 6 11/01/2014 at Unknown time  . colchicine 0.6 MG tablet Take 0.5 tablets (0.3 mg total) by mouth daily. 30 tablet 0 11/01/2014 at Unknown time  . cyanocobalamin (,VITAMIN B-12,) 1000 MCG/ML injection Inject 1 mL into the muscle every 30 (thirty) days.  12 Past Month at Unknown time  . ferrous sulfate 325 (65 FE) MG tablet Take 325 mg by mouth 2 (two) times daily with a meal.   11/01/2014 at Unknown time  . levothyroxine (SYNTHROID, LEVOTHROID) 50 MCG tablet Take 50 mcg by mouth daily before breakfast.   11/01/2014 at Unknown time  . omeprazole (PRILOSEC) 20 MG capsule Take 1 capsule (20 mg total) by mouth daily. Medicine to help protect your stomach lining from ulcers while on coumadin 30 capsule 3 11/01/2014 at Unknown time  . potassium chloride SA (K-DUR,KLOR-CON) 20 MEQ tablet Take 1 tablet (20 mEq total) by mouth daily. 60 tablet 6 11/01/2014 at Unknown time  . simvastatin (ZOCOR) 10 MG tablet TAKE ONE TABLET BY MOUTH EVERY EVENING. 30 tablet 3 11/01/2014 at Unknown time  . tolterodine (DETROL LA) 4 MG 24 hr capsule Take 4 mg by mouth daily.  11/01/2014 at Unknown time  . torsemide (DEMADEX) 100 MG tablet STARTING TOMORROW, TAKE A HALF A TABLET DAILY FOR 3 DAYS AND THEN RESTART ONE TABLET DAILY THEREAFTER. (Patient taking differently: Take 100 mg by mouth every morning. )   11/01/2014 at Unknown time  . warfarin (COUMADIN) 2.5 MG tablet Take 1.25-2.5 mg by mouth daily at 6 PM. Take 2.5mg  on Mondays, Wednesday, Friday. 1.25 mg on Tuesday,Thursday,Saturday,Sunday   11/01/2014 at 1800  . metolazone (ZAROXOLYN) 2.5 MG tablet Take 1 tablet (2.5 mg total) by mouth as directed. TAKE 1 TABLET AS NEEDED ONLY IF YOUR WEIGHT INCREASES 2-3 POUNDS IN 24 HOURS. (Patient taking differently: Take 2.5 mg by mouth daily. Marland Kitchen)   More than  a month at Unknown time    Assessment: 74 yo lady to continue coumadin for afib and rocephin for CAP.  Her admission INR is therapeutic. Goal of Therapy:  INR 2-3 Monitor platelets by anticoagulation protocol: Yes  Eradication of infection   Plan:  Cont home dose of coumadin Cont rocephin 1 gm IV q24 hours Daily PT/INR F/u cultures and clinical course  Nakhia Levitan [place] 11/02/2014,9:39 AM

## 2014-11-02 NOTE — Progress Notes (Signed)
Patient briefly seen and examined. Discussed with significant other Dwayne at bedside. Patient admitted this am for SOB. Found to have acute CHF as well as CAP. Strict Is and Os, daily weights, lasix, abx. Will continue to follow closely. Is a heavy smoker.  Domingo Mend, MD [facility] Pager: [phone]

## 2014-11-02 NOTE — H&P (Signed)
PCP:   [name], MD   Chief Complaint:  Shortness of breath  HPI: 74 year old female who   has a past medical history of COPD (chronic obstructive pulmonary disease); History of TIA (transient ischemic attack); History of stroke; Obesity; Juvenile rheumatic fever; Essential hypertension, benign; Hyperlipidemia; Mitral regurgitation; CKD (chronic kidney disease) stage 4, GFR 15-29 ml/min; Paroxysmal atrial fibrillation; Chronic diastolic congestive heart failure; Arthritis; Aortic insufficiency due to bicuspid aortic valve; S/P minimally invasive mitral valve replacement with bioprosthetic valve and maze procedure (05/16/2013); S/P Maze operation for atrial fibrillation (05/16/2013); and NICM (nonischemic cardiomyopathy). Today presents to the hospital with chief data shortness of breath which started yesterday morning. Patient was feeling very weak, she wears oxygen all the time was not able to ambulate much yesterday because of dyspnea. She denies nausea vomiting or diarrhea. No fever. In the ED patient was found to have CHF exacerbation as well as  Pneumonia per chest x-ray. Patient started on IV antibiotics   Allergies:   Allergies  Allergen Reactions  . Indomethacin Other (See Comments)    dizziness  . Norvasc [Amlodipine Besylate] Cough      Past Medical History  Diagnosis Date  . COPD (chronic obstructive pulmonary disease)   . History of TIA (transient ischemic attack)     Diagnosed 2003  . History of stroke     2000 [place]  . Obesity   . Juvenile rheumatic fever     Age 89  . Essential hypertension, benign   . Hyperlipidemia   . Mitral regurgitation     Status post bioprosthetic MVR (05/2013)  . CKD (chronic kidney disease) stage 4, GFR 15-29 ml/min   . Paroxysmal atrial fibrillation     s/p MAZE (05/2013)  . Chronic diastolic congestive heart failure     a) ECHO (09/2013) EF 50-55%  . Arthritis   . Aortic insufficiency due to bicuspid aortic valve     Moderate by  TEE  . 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 S/P Maze operation for atrial fibrillation 05/16/2013    Complete bilateral atrial lesion set using cryothermy and bipolar radiofrequency ablation with oversewing of LA appendage  . NICM (nonischemic cardiomyopathy)     a) LHC (04/2013) no significant CAD    Past Surgical History  Procedure Laterality Date  . Total knee arthroplasty Right   . Abdominal hysterectomy      Cervical Cancer  . Parathyroid/thyroid surgery      Tumor  . Tee without cardioversion N/A 04/06/2013    Procedure: TRANSESOPHAGEAL ECHOCARDIOGRAM (TEE);  Surgeon: [name], MD;  [facility];  Service: Cardiology;  Laterality: N/A;  . Minimally invasive maze procedure N/A 05/16/2013    Procedure: MINIMALLY INVASIVE MAZE PROCEDURE;  Surgeon: Rexene Alberts, MD;  Location: [place];  Service: Open Heart Surgery;  Laterality: N/A;  . Intraoperative transesophageal echocardiogram N/A 05/16/2013    Procedure: INTRAOPERATIVE TRANSESOPHAGEAL ECHOCARDIOGRAM;  Surgeon: Rexene Alberts, MD;  Location: [place];  Service: Open Heart Surgery;  Laterality: N/A;  . Mitral valve replacement Right 05/16/2013    Procedure: MINIMALLY INVASIVE MITRAL VALVE (MV) REPLACEMENT;  Surgeon: Rexene Alberts, MD;  Location: [place];  Service: Open Heart Surgery;  Laterality: Right;  . Tee without cardioversion N/A 06/06/2013    Procedure: TRANSESOPHAGEAL ECHOCARDIOGRAM (TEE);  Surgeon: Larey Dresser, MD;  Location: [facility];  Service: Cardiovascular;  Laterality: N/A;  . Cardioversion N/A 06/06/2013    Procedure: CARDIOVERSION;  Surgeon: Larey Dresser, MD;  Location: [facility];  Service: Cardiovascular;  Laterality: N/A;  . Left and right heart catheterization with coronary angiogram N/A 05/04/2013    Procedure: LEFT AND RIGHT HEART  CATHETERIZATION WITH CORONARY ANGIOGRAM;  Surgeon: Jettie Booze, MD;  Location: [facility] C[facility] LAB;  Service: Cardiovascular;  Laterality: N/A;    Prior to Admission medications   Medication Sig Start Date End Date Taking? Authorizing Provider  amiodarone (PACERONE) 200 MG tablet Take 0.5 tablets (100 mg total) by mouth daily. 10/10/14   [name], MD  colchicine 0.6 MG tablet Take 0.5 tablets (0.3 mg total) by mouth daily. 10/11/13   Rande Brunt, NP  cyanocobalamin (,VITAMIN B-12,) 1000 MCG/ML injection Inject 1 mL into the muscle every 30 (thirty) days. 06/14/14   Historical Provider, MD  ferrous sulfate 325 (65 FE) MG tablet Take 325 mg by mouth 2 (two) times daily with a meal.    Historical Provider, MD  levothyroxine (SYNTHROID, LEVOTHROID) 50 MCG tablet Take 50 mcg by mouth daily before breakfast.    Historical Provider, MD  metolazone (ZAROXOLYN) 2.5 MG tablet Take 1 tablet (2.5 mg total) by mouth as directed. TAKE 1 TABLET AS NEEDED ONLY IF YOUR WEIGHT INCREASES 2-3 POUNDS IN 24 HOURS. Patient taking differently: Take 2.5 mg by mouth daily. . 05/13/14   Rexene Alberts, MD  omeprazole (PRILOSEC) 20 MG capsule Take 1 capsule (20 mg total) by mouth daily. Medicine to help protect your stomach lining from ulcers while on coumadin 05/13/14   Rexene Alberts, MD  potassium chloride SA (K-DUR,KLOR-CON) 20 MEQ tablet Take 1 tablet (20 mEq total) by mouth daily. 05/30/13   Donielle Liston Alba, PA-C  simvastatin (ZOCOR) 10 MG tablet TAKE ONE TABLET BY MOUTH EVERY EVENING. 12/10/13   Tiffany L Reed, DO  tolterodine (DETROL LA) 4 MG 24 hr capsule Take 4 mg by mouth daily.    Historical Provider, MD  torsemide (DEMADEX) 100 MG tablet STARTING TOMORROW, TAKE A HALF A TABLET DAILY FOR 3 DAYS AND THEN RESTART ONE TABLET DAILY THEREAFTER. Patient taking differently: Take 100 mg by mouth every morning.  05/13/14   Rexene Alberts, MD  warfarin (COUMADIN) 2.5 MG tablet Take 1.25-2.5 mg by mouth daily at 6 PM. As  directed per [place] Provider, MD    Social History:  reports that she has never smoked. She has never used smokeless tobacco. She reports that she does not drink alcohol or use illicit drugs.  Family History  Problem Relation Age of Onset  . Heart failure Father   . Heart attack Brother     [employer]   11/02/14 0059  Weight: 70.761 kg (156 lb)    All the positives are listed in BOLD  Review of Systems:  HEENT: Headache, blurred vision, runny nose, sore throat Neck: Hypothyroidism, hyperthyroidism,,lymphadenopathy Chest : Shortness of breath, history of COPD, Asthma Heart : Chest pain, history of coronary arterey disease GI:  Nausea, vomiting, diarrhea, constipation, GERD GU: Dysuria, urgency, frequency of urination, hematuria Neuro: Stroke, seizures, syncope Psych: Depression, anxiety, hallucinations   Physical Exam: Blood pressure 128/55, pulse 69, temperature 97.9 F (36.6 C), temperature source Oral, resp. rate 29, height 5\' 2"  (1.575 m), weight 70.761 kg (156 lb), SpO2 96 %. Constitutional:   Patient is a well-developed and well-nourished female* in no acute distress and  cooperative with exam. Head: Normocephalic and atraumatic Mouth: Mucus membranes moist Eyes: PERRL, EOMI, conjunctivae normal Neck: Supple, No Thyromegaly Cardiovascular: RRR, S1 normal, S2 normal Pulmonary/Chest: Bibasilar crackles Abdominal: Soft. Non-tender, non-distended, bowel sounds are normal, no masses, organomegaly, or guarding present.  Neurological: A&O x3, Strength is normal and symmetric bilaterally, cranial nerve II-XII are grossly intact, no focal motor deficit, sensory intact to light touch bilaterally.  Extremities : No Cyanosis, Clubbing or Edema. Bilateral lower extremity erythema, warm to touch.  Labs on Admission:  Basic Metabolic Panel:  Recent Labs Lab 11/02/14 0115  NA 142  K 3.5  CL 102  CO2 34*  GLUCOSE 98  BUN 21*  CREATININE 1.59*  CALCIUM 9.0     CBC:  Recent Labs Lab 11/02/14 0115  WBC 4.9  NEUTROABS 3.2  HGB 10.9*  HCT 34.1*  MCV 96.9  PLT 158   Cardiac Enzymes:  Recent Labs Lab 11/02/14 0115  TROPONINI 0.03    BNP (last 3 results)  Recent Labs  11/02/14 0115  BNP 1152.0*    ProBNP (last 3 results)  Recent Labs  11/29/13 0950 02/15/14 1040  PROBNP 2233.0* 2802.0*    CBG: No results for input(s): GLUCAP in the last 168 hours.  Radiological Exams on Admission: Dg Chest 2 View  11/02/2014   CLINICAL DATA:  Shortness of breath. Decreased oxygen saturation since yesterday evening.  EXAM: CHEST  2 VIEW  COMPARISON:  05/08/2014  FINDINGS: Cardiac valve prosthesis. Shallow inspiration. Cardiac enlargement with mild pulmonary vascular congestion. Mild perihilar infiltration suggesting edema. Edema and consolidation pattern are improved since previous study. There is a small right pleural effusion with focal airspace disease in the right lung base which could represent superimposed pneumonia. No pneumothorax. Degenerative changes in the spine. Compression of an upper lumbar vertebra.  IMPRESSION: Cardiac enlargement with improved pulmonary congestion and edema compared with previous study. There is a small right pleural effusion with vague airspace disease suggested in the right lung base. This may indicate superimposed pneumonia.   Electronically Signed   By: Lucienne Capers M.D.   On: 11/02/2014 02:44    EKG: Independently reviewed. 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 BNP 1152 Straight I's and O's  Community acquired pneumonia Chest x-ray shows pneumonia, Rocephin and Zithromax has been started in the ED Follow blood culture results.  History of paroxysmal A. Fib Continue Coumadin per pharmacy Heart rate is controlled  Status post MVR, bioprosthetic valve Stable  ? Bilateral lower extremity cellulitis Patient has marked erythema as well as warmth in the lower extremities Continue ceftriaxone, if no improvement consider vancomycin   Code status: Partial code, DO NOT INTUBATE  Family discussion: Admission, patients condition and plan of care including tests being ordered have been discussed with the patient and *her granddaughter at bedside* who indicate understanding and agree with the plan and Code Status.   Time Spent on Admission: 60 min  [facility] Pager: [phone] 11/02/2014, 5:39 AM  If 7PM-7AM, please contact night-coverage  [email or web address]  Password [email or web address]

## 2014-11-02 NOTE — ED Notes (Signed)
Placed patient on 2L O2, oxygen saturation increased to 94%.

## 2014-11-02 NOTE — Progress Notes (Signed)
UR Completed. Tineka Uriegas, RN, BSN.  [phone] 

## 2014-11-02 NOTE — ED Provider Notes (Addendum)
CSN: [id number]     Arrival date & time 11/02/14  0043 History   First MD Initiated Contact with Patient 11/02/14 0131     Chief Complaint  Patient presents with  . Shortness of Breath     (Consider location/radiation/quality/duration/timing/severity/associated sxs/prior Treatment) HPI patient has a history of COPD, aortic insufficiency, mitral regurgitation status post mitral valve replacement, reports that she did not feel well at all today. She states she just felt weak and tired. She has oxygen to use at home when necessary when she feels short of breath. She states she used her oxygen all day today without feeling better. She states she felt short of breath even when she was using the oxygen. She states she has had a cough but cannot tell me how long it has been there or when it got worse. She denies chest pain. She states she has swelling in her legs but her daughter notes that her swelling is much improved. Patient states normally her left leg is more swollen than the right. She denies any fever, wheezing, nausea, vomiting. She states she did have some diarrhea earlier today but not later in the day. She states she normally has to take care of her invalid husband which she has been able to do today however it is made her exhausted.  PCP Dr Quintin Alto in [facility] Cardiology Dr Harl Bowie  Past Medical History  Diagnosis Date  . COPD (chronic obstructive pulmonary disease)   . History of TIA (transient ischemic attack)     Diagnosed 2003  . History of stroke     2000 [place]  . Obesity   . Juvenile rheumatic fever     Age 11  . Essential hypertension, benign   . Hyperlipidemia   . Mitral regurgitation     Status post bioprosthetic MVR (05/2013)  . CKD (chronic kidney disease) stage 4, GFR 15-29 ml/min   . Paroxysmal atrial fibrillation     s/p MAZE (05/2013)  . Chronic diastolic congestive heart failure     a) ECHO (09/2013) EF 50-55%  . Arthritis   . Aortic insufficiency due to bicuspid aortic  valve     Moderate by TEE  . 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 S/P Maze operation for atrial fibrillation 05/16/2013    Complete bilateral atrial lesion set using cryothermy and bipolar radiofrequency ablation with oversewing of LA appendage  . NICM (nonischemic cardiomyopathy)     a) LHC (04/2013) no significant CAD   Past Surgical History  Procedure Laterality Date  . Total knee arthroplasty Right   . Abdominal hysterectomy      Cervical Cancer  . Parathyroid/thyroid surgery      Tumor  . Tee without cardioversion N/A 04/06/2013    Procedure: TRANSESOPHAGEAL ECHOCARDIOGRAM (TEE);  Surgeon: [name], MD;  [facility];  Service: Cardiology;  Laterality: N/A;  . Minimally invasive maze procedure N/A 05/16/2013    Procedure: MINIMALLY INVASIVE MAZE PROCEDURE;  Surgeon: Rexene Alberts, MD;  Location: [place];  Service: Open Heart Surgery;  Laterality: N/A;  . Intraoperative transesophageal echocardiogram N/A 05/16/2013    Procedure: INTRAOPERATIVE TRANSESOPHAGEAL ECHOCARDIOGRAM;  Surgeon: Rexene Alberts, MD;  Location: [place];  Service: Open Heart Surgery;  Laterality: N/A;  . Mitral valve replacement Right 05/16/2013    Procedure: MINIMALLY INVASIVE MITRAL VALVE (MV) REPLACEMENT;  Surgeon: Rexene Alberts, MD;  Location: [place];  Service: Open Heart Surgery;  Laterality: Right;  . Tee without cardioversion N/A 06/06/2013    Procedure: TRANSESOPHAGEAL ECHOCARDIOGRAM (TEE);  Surgeon: Larey Dresser, MD;  Location: [place];  Service: Cardiovascular;  Laterality: N/A;  . Cardioversion N/A 06/06/2013    Procedure: CARDIOVERSION;  Surgeon: Larey Dresser, MD;  Location: [facility] ENDOSCOPY;  Service: Cardiovascular;  Laterality: N/A;  . Left and right heart catheterization with coronary angiogram N/A 05/04/2013    Procedure:  LEFT AND RIGHT HEART CATHETERIZATION WITH CORONARY ANGIOGRAM;  Surgeon: Jettie Booze, MD;  Location: [facility] CATH LAB;  Service: Cardiovascular;  Laterality: N/A;   Family History  Problem Relation Age of Onset  . Heart failure Father   . Heart attack Brother    Social History  Substance Use Topics  . Smoking status: Never Smoker   . Smokeless tobacco: Never Used  . Alcohol Use: No   Patient's son lives with her Uses a walker Uses oxygen 2 lpm  prn  OB History    No data available     Review of Systems  All other systems reviewed and are negative.     Allergies  Indomethacin and Norvasc  Home Medications   Prior to Admission medications   Medication Sig Start Date End Date Taking? Authorizing Provider  amiodarone (PACERONE) 200 MG tablet Take 0.5 tablets (100 mg total) by mouth daily. 10/10/14  Yes [name], MD  colchicine 0.6 MG tablet Take 0.5 tablets (0.3 mg total) by mouth daily. 10/11/13  Yes Rande Brunt, NP  cyanocobalamin (,VITAMIN B-12,) 1000 MCG/ML injection Inject 1 mL into the muscle every 30 (thirty) days. 06/14/14  Yes Historical Provider, MD  ferrous sulfate 325 (65 FE) MG tablet Take 325 mg by mouth 2 (two) times daily with a meal.   Yes Historical Provider, MD  levothyroxine (SYNTHROID, LEVOTHROID) 50 MCG tablet Take 50 mcg by mouth daily before breakfast.   Yes Historical Provider, MD  omeprazole (PRILOSEC) 20 MG capsule Take 1 capsule (20 mg total) by mouth daily. Medicine to help protect your stomach lining from ulcers while on coumadin 05/13/14  Yes Rexene Alberts, MD  potassium chloride SA (K-DUR,KLOR-CON) 20 MEQ tablet Take 1 tablet (20 mEq total) by mouth daily. 05/30/13  Yes Donielle Liston Alba, PA-C  simvastatin (ZOCOR) 10 MG tablet TAKE ONE TABLET BY MOUTH EVERY EVENING. 12/10/13  Yes Tiffany L Reed, DO  tolterodine (DETROL LA) 4 MG 24 hr capsule Take 4 mg by mouth daily.   Yes Historical Provider, MD  torsemide (DEMADEX) 100 MG tablet  STARTING TOMORROW, TAKE A HALF A TABLET DAILY FOR 3 DAYS AND THEN RESTART ONE TABLET DAILY THEREAFTER. Patient taking differently: Take 100 mg by mouth every morning.  05/13/14  Yes Rexene Alberts, MD  warfarin (COUMADIN) 2.5 MG tablet Take 1.25-2.5 mg by mouth daily at 6 PM. Take 2.5mg  on Mondays, Wednesday, Friday. 1.25 mg on Tuesday,Thursday,Saturday,Sunday   Yes Historical Provider, MD  metolazone (ZAROXOLYN) 2.5 MG tablet Take 1 tablet (2.5 mg total) by mouth as directed. TAKE 1 TABLET AS NEEDED ONLY IF YOUR WEIGHT INCREASES 2-3 POUNDS IN 24 HOURS. Patient taking differently: Take 2.5 mg by mouth daily. . 05/13/14   Rexene Alberts, MD   BP 117/64 mmHg  Pulse 61  Temp(Src) 97.9 F (36.6 C) (Oral)  Resp 22  Ht 5\' 2"  (1.575 m)  Wt 156 lb (70.761 kg)  BMI 28.53 kg/m2  SpO2 94%  Vital signs normal   Physical Exam  Constitutional: She is oriented to person, place, and time. She appears well-developed and well-nourished.  Non-toxic appearance. She does not appear ill. No distress.  HENT:  Head: Normocephalic and atraumatic.  Right Ear: External ear normal.  Left Ear: External ear normal.  Nose: Nose normal. No mucosal edema or rhinorrhea.  Mouth/Throat: Oropharynx is clear and moist and mucous membranes are normal. No dental abscesses or uvula swelling.  Eyes: Conjunctivae and EOM are normal. Pupils are equal, round, and reactive to light.  Neck: Normal range of motion and full passive range of motion without pain. Neck supple.  Cardiovascular: Normal rate, regular rhythm and normal heart sounds.  Exam reveals no gallop and no friction rub.   No murmur heard. Pulmonary/Chest: Effort normal and breath sounds normal. No respiratory distress. She has no wheezes. She has no rhonchi. She has no rales. She exhibits no tenderness and no crepitus.  Abdominal: Soft. Normal appearance and bowel sounds are normal. She exhibits no distension. There is no tenderness. There is no rebound and no guarding.   Musculoskeletal: Normal range of motion. She exhibits no edema or tenderness.  Moves all extremities well. Patient has diffuse hyperpigmentation of both lower extremities with a mild underlying tone of redness which the patient and her daughter state is chronic.  Neurological: She is alert and oriented to person, place, and time. She has normal strength. No cranial nerve deficit.  Skin: Skin is warm, dry and intact. No rash noted. No erythema. No pallor.  Psychiatric: She has a normal mood and affect. Her speech is normal and behavior is normal. Her mood appears not anxious.  Nursing note and vitals reviewed.      ED Course  Procedures (including critical care time)  Medications  azithromycin (ZITHROMAX) 500 mg in dextrose 5 % 250 mL IVPB (500 mg Intravenous New Bag/Given 11/02/14 0529)  cefTRIAXone (ROCEPHIN) 1 g in dextrose 5 % 50 mL IVPB (0 g Intravenous Stopped 11/02/14 0528)   Patient was noted to be hypoxic when she was in triage with pulse ox 87%. She was placed on oxygen 2 L/m nasal cannula. It should be noted when patient went to x-ray the oxygen tank on her stretcher became empty and when she returned from radiology her pulse ox was 78% on room air. Her oxygenation improved rapidly when she was placed back on her nasal cannula. Patient states she did not feel worse when she returned from x-ray. After reviewing her x-ray patient was started on community-acquired pneumonia antibiotics for possible new infiltrate seen on the right. Due to her underlying congestive heart failure and hypoxia she was offered admission and she is agreeable.  04:32 Dr Darrick Meigs, admit to tele  Labs Review Results for orders placed or performed during the hospital encounter of 11/02/14  CBC with Differential  Result Value Ref Range   WBC 4.9 4.0 - 10.5 K/uL   RBC 3.52 (L) 3.87 - 5.11 MIL/uL   Hemoglobin 10.9 (L) 12.0 - 15.0 g/dL   HCT 34.1 (L) 36.0 - 46.0 %   MCV 96.9 78.0 - 100.0 fL   MCH 31.0 26.0 - 34.0  pg   MCHC 32.0 30.0 - 36.0 g/dL   RDW 14.1 11.5 - 15.5 %   Platelets 158 150 - 400 K/uL   Neutrophils Relative % 66 43 - 77 %   Neutro Abs 3.2 1.7 - 7.7 K/uL   Lymphocytes Relative 17 12 - 46 %   Lymphs  Abs 0.8 0.7 - 4.0 K/uL   Monocytes Relative 12 3 - 12 %   Monocytes Absolute 0.6 0.1 - 1.0 K/uL   Eosinophils Relative 5 0 - 5 %   Eosinophils Absolute 0.3 0.0 - 0.7 K/uL   Basophils Relative 0 0 - 1 %   Basophils Absolute 0.0 0.0 - 0.1 K/uL  Basic metabolic panel  Result Value Ref Range   Sodium 142 135 - 145 mmol/L   Potassium 3.5 3.5 - 5.1 mmol/L   Chloride 102 101 - 111 mmol/L   CO2 34 (H) 22 - 32 mmol/L   Glucose, Bld 98 65 - 99 mg/dL   BUN 21 (H) 6 - 20 mg/dL   Creatinine, Ser 1.59 (H) 0.44 - 1.00 mg/dL   Calcium 9.0 8.9 - 10.3 mg/dL   GFR calc non Af Amer 31 (L) >60 mL/min   GFR calc Af Amer 36 (L) >60 mL/min   Anion gap 6 5 - 15  Brain natriuretic peptide  Result Value Ref Range   B Natriuretic Peptide 1152.0 (H) 0.0 - 100.0 pg/mL  Troponin I  Result Value Ref Range   Troponin I 0.03 <0.031 ng/mL   Laboratory interpretation all normal except renal insufficiency, elevation of her BNP, mild anemia   Imaging Review Dg Chest 2 View  11/02/2014   CLINICAL DATA:  Shortness of breath. Decreased oxygen saturation since yesterday evening.  EXAM: CHEST  2 VIEW  COMPARISON:  05/08/2014  FINDINGS: Cardiac valve prosthesis. Shallow inspiration. Cardiac enlargement with mild pulmonary vascular congestion. Mild perihilar infiltration suggesting edema. Edema and consolidation pattern are improved since previous study. There is a small right pleural effusion with focal airspace disease in the right lung base which could represent superimposed pneumonia. No pneumothorax. Degenerative changes in the spine. Compression of an upper lumbar vertebra.  IMPRESSION: Cardiac enlargement with improved pulmonary congestion and edema compared with previous study. There is a small right pleural  effusion with vague airspace disease suggested in the right lung base. This may indicate superimposed pneumonia.   Electronically Signed   By: Lucienne Capers M.D.   On: 11/02/2014 02:44   I have personally reviewed and evaluated these images and lab results as part of my medical decision-making.   EKG Interpretation   Date/Time:  Saturday November 02 2014 01:05:41 EDT Ventricular Rate:  58 PR Interval:  202 QRS Duration: 125 QT Interval:  496 QTC Calculation: 487 R Axis:   -43 Text Interpretation:  Sinus rhythm Left bundle branch block Baseline  wander in lead(s) II No significant change since last tracing 08 May 2014  Confirmed by Arriana Lohmann  MD-I, Nykayla Marcelli ([id number]) on 11/02/2014 1:31:09 AM      MDM   Final diagnoses:  CAP (community acquired pneumonia)  Hypoxia    Plan admission  Rolland Porter, MD, Barbette Or, MD 11/02/14 [place], MD 11/02/14 2259

## 2014-11-02 NOTE — Progress Notes (Signed)
Dr. Jerilee Hoh notified that the patient is incontinent of urine so it will be difficult to do accurate output.  This is through via text.

## 2014-11-02 NOTE — ED Notes (Signed)
Patient complaining of shortness of breath starting yesterday. Denies chest pain.

## 2014-11-03 DIAGNOSIS — I509 Heart failure, unspecified: Secondary | ICD-10-CM

## 2014-11-03 LAB — COMPREHENSIVE METABOLIC PANEL
ALK PHOS: 113 U/L (ref 38–126)
ALT: 9 U/L — AB (ref 14–54)
AST: 18 U/L (ref 15–41)
Albumin: 2.9 g/dL — ABNORMAL LOW (ref 3.5–5.0)
Anion gap: 6 (ref 5–15)
BUN: 20 mg/dL (ref 6–20)
CALCIUM: 8.7 mg/dL — AB (ref 8.9–10.3)
CHLORIDE: 101 mmol/L (ref 101–111)
CO2: 33 mmol/L — AB (ref 22–32)
CREATININE: 1.42 mg/dL — AB (ref 0.44–1.00)
GFR calc Af Amer: 41 mL/min — ABNORMAL LOW (ref >60)
GFR calc non Af Amer: 36 mL/min — ABNORMAL LOW (ref >60)
Glucose, Bld: 109 mg/dL — ABNORMAL HIGH (ref 65–99)
Potassium: 3.5 mmol/L (ref 3.5–5.1)
SODIUM: 140 mmol/L (ref 135–145)
Total Bilirubin: 0.8 mg/dL (ref 0.3–1.2)
Total Protein: 6.3 g/dL — ABNORMAL LOW (ref 6.5–8.1)

## 2014-11-03 LAB — PROTIME-INR
INR: 2.66 — ABNORMAL HIGH (ref 0.00–1.49)
Prothrombin Time: 28 seconds — ABNORMAL HIGH (ref 11.6–15.2)

## 2014-11-03 LAB — CBC
HCT: 32.1 % — ABNORMAL LOW (ref 36.0–46.0)
HEMOGLOBIN: 10.2 g/dL — AB (ref 12.0–15.0)
MCH: 31.1 pg (ref 26.0–34.0)
MCHC: 31.8 g/dL (ref 30.0–36.0)
MCV: 97.9 fL (ref 78.0–100.0)
PLATELETS: 142 10*3/uL — AB (ref 150–400)
RBC: 3.28 MIL/uL — AB (ref 3.87–5.11)
RDW: 14.1 % (ref 11.5–15.5)
WBC: 6 10*3/uL (ref 4.0–10.5)

## 2014-11-03 MED ORDER — INFLUENZA VAC SPLIT QUAD 0.5 ML IM SUSY
0.5000 mL | PREFILLED_SYRINGE | INTRAMUSCULAR | Status: AC
  Administered 2014-11-04: 0.5 mL via INTRAMUSCULAR
  Filled 2014-11-03: qty 0.5

## 2014-11-03 NOTE — Progress Notes (Signed)
[facility] PROGRESS NOTE  [name] [name] PIR:[id number] DOB: Jul 11, 1940 DOA: 11/02/2014 PCP: [name], MD  Assessment/Plan: Acute on Chronic Diastolic CHF -Still appears some volume overloaded on exam with crackles on lung auscultation and LE edema. -Continue lasix 20 mg IV BID for now. -Is and Os are inaccurate as patient is incontinent of urine. -ECHO 3/16: Study Conclusions  - Left ventricle: The cavity size was normal. There was mild concentric hypertrophy. Systolic function was normal. The estimated ejection fraction was in the range of 50% to 55%. Images were inadequate for LV wall motion assessment. Diastolic dysfunction, indeterminate grade.  CAP -Continue rocephin/azithro. -All cx data is negative to date.  H/o A Fib -Rate controlled. -Continue coumadin.  S/p MVR -Bioprosthetic valve.  Acute on CKD Stage IV -Baseline Cr around 1.7-1.9. -Cr is better than baseline at 1.4 today.  Code Status: Partial Family Communication: Patient only  Disposition Plan: Home when medically ready, likely 24-48 hours.   Consultants:  None   Antibiotics:  Rocephin  Azithomycin   Subjective: Feels much better, less SOB  Objective: Filed Vitals:   11/02/14 0530 11/02/14 1359 11/02/14 2313 11/03/14 0653  BP: 117/64 129/65 165/75 173/67  Pulse: 61 72 81 77  Temp:  98.5 F (36.9 C) 98.6 F (37 C) 98.3 F (36.8 C)  TempSrc:   Oral Oral  Resp: 22 22 20 20   Height:      Weight:    70.36 kg (155 lb 1.9 oz)  SpO2: 94% 95% 92% 93%    Intake/Output Summary (Last 24 hours) at 11/03/14 1127 Last data filed at 11/02/14 1638  Gross per 24 hour  Intake    650 ml  Output      0 ml  Net    650 ml   Filed Weights   11/02/14 0059 11/03/14 0653  Weight: 70.761 kg (156 lb) 70.36 kg (155 lb 1.9 oz)    Exam:   General:  AA Ox3  Cardiovascular: RRR  Respiratory: bilateral crackles, more prominent at the bases  Abdomen:  S/NT/ND/+BS  Extremities: 2+ edema bilaterally; chronic venous insufficiency skin color changes.   Neurologic:  Grossly intact and non-focal.  Data Reviewed: Basic Metabolic Panel:  Recent Labs Lab 11/02/14 0115 11/03/14 0556  NA 142 140  K 3.5 3.5  CL 102 101  CO2 34* 33*  GLUCOSE 98 109*  BUN 21* 20  CREATININE 1.59* 1.42*  CALCIUM 9.0 8.7*   Liver Function Tests:  Recent Labs Lab 11/03/14 0556  AST 18  ALT 9*  ALKPHOS 113  BILITOT 0.8  PROT 6.3*  ALBUMIN 2.9*   No results for input(s): LIPASE, AMYLASE in the last 168 hours. No results for input(s): AMMONIA in the last 168 hours. CBC:  Recent Labs Lab 11/02/14 0115 11/03/14 0556  WBC 4.9 6.0  NEUTROABS 3.2  --   HGB 10.9* 10.2*  HCT 34.1* 32.1*  MCV 96.9 97.9  PLT 158 142*   Cardiac Enzymes:  Recent Labs Lab 11/02/14 0115 11/02/14 0851 11/02/14 1328 11/02/14 1941  TROPONINI 0.03 0.03 0.03 <0.03   BNP (last 3 results)  Recent Labs  11/02/14 0115  BNP 1152.0*    ProBNP (last 3 results)  Recent Labs  11/29/13 0950 02/15/14 1040  PROBNP 2233.0* 2802.0*    CBG: No results for input(s): GLUCAP in the last 168 hours.  Recent Results (from the past 240 hour(s))  Culture, blood (routine x 2)     Status: None (Preliminary result)  Collection Time: 11/02/14  6:50 AM  Result Value Ref Range Status   Specimen Description LEFT ANTECUBITAL  Final   Special Requests   Final    BOTTLES DRAWN AEROBIC AND ANAEROBIC  AEB 6CC ANA 10CC   Culture NO GROWTH < 24 HOURS  Final   Report Status PENDING  Incomplete  Culture, blood (routine x 2)     Status: None (Preliminary result)   Collection Time: 11/02/14  6:54 AM  Result Value Ref Range Status   Specimen Description BLOOD LEFT HAND  Final   Special Requests BOTTLES DRAWN AEROBIC AND ANAEROBIC 8CC  Final   Culture NO GROWTH < 24 HOURS  Final   Report Status PENDING  Incomplete     Studies: Dg Chest 2 View  11/02/2014   CLINICAL DATA:   Shortness of breath. Decreased oxygen saturation since yesterday evening.  EXAM: CHEST  2 VIEW  COMPARISON:  05/08/2014  FINDINGS: Cardiac valve prosthesis. Shallow inspiration. Cardiac enlargement with mild pulmonary vascular congestion. Mild perihilar infiltration suggesting edema. Edema and consolidation pattern are improved since previous study. There is a small right pleural effusion with focal airspace disease in the right lung base which could represent superimposed pneumonia. No pneumothorax. Degenerative changes in the spine. Compression of an upper lumbar vertebra.  IMPRESSION: Cardiac enlargement with improved pulmonary congestion and edema compared with previous study. There is a small right pleural effusion with vague airspace disease suggested in the right lung base. This may indicate superimposed pneumonia.   Electronically Signed   By: Lucienne Capers M.D.   On: 11/02/2014 02:44    Scheduled Meds: . amiodarone  100 mg Oral Daily  . azithromycin  500 mg Intravenous Q24H  . cefTRIAXone (ROCEPHIN)  IV  1 g Intravenous Q24H  . furosemide  20 mg Intravenous Q12H  . [START ON 11/04/2014] Influenza vac split quadrivalent PF  0.5 mL Intramuscular Tomorrow-1000  . levothyroxine  50 mcg Oral QAC breakfast  . pantoprazole  40 mg Oral Daily  . potassium chloride SA  20 mEq Oral Daily  . sodium chloride  3 mL Intravenous Q12H  . sodium chloride  3 mL Intravenous Q12H  . warfarin  1.25 mg Oral Once per day on Sun Tue Thu Sat  . [START ON 11/04/2014] warfarin  2.5 mg Oral Once per day on Mon Wed Fri  . 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 Greater than 50% of this time was spent in direct contact with the patient coordinating care.    Lelon Frohlich  [facility] Pager [phone]  If 7PM-7AM, please contact night-coverage at [email or web address], password [facility] 11/03/2014, 11:27 AM  LOS: 1 day

## 2014-11-04 DIAGNOSIS — J449 Chronic obstructive pulmonary disease, unspecified: Secondary | ICD-10-CM | POA: Diagnosis not present

## 2014-11-04 DIAGNOSIS — I5033 Acute on chronic diastolic (congestive) heart failure: Secondary | ICD-10-CM | POA: Diagnosis not present

## 2014-11-04 DIAGNOSIS — R0902 Hypoxemia: Secondary | ICD-10-CM | POA: Diagnosis not present

## 2014-11-04 DIAGNOSIS — R0602 Shortness of breath: Secondary | ICD-10-CM

## 2014-11-04 DIAGNOSIS — Z9981 Dependence on supplemental oxygen: Secondary | ICD-10-CM | POA: Diagnosis not present

## 2014-11-04 DIAGNOSIS — N184 Chronic kidney disease, stage 4 (severe): Secondary | ICD-10-CM | POA: Diagnosis not present

## 2014-11-04 DIAGNOSIS — Z8249 Family history of ischemic heart disease and other diseases of the circulatory system: Secondary | ICD-10-CM | POA: Diagnosis not present

## 2014-11-04 DIAGNOSIS — I480 Paroxysmal atrial fibrillation: Secondary | ICD-10-CM | POA: Diagnosis not present

## 2014-11-04 DIAGNOSIS — Z953 Presence of xenogenic heart valve: Secondary | ICD-10-CM | POA: Diagnosis not present

## 2014-11-04 DIAGNOSIS — J189 Pneumonia, unspecified organism: Secondary | ICD-10-CM | POA: Diagnosis not present

## 2014-11-04 LAB — PROTIME-INR
INR: 2.67 — ABNORMAL HIGH (ref 0.00–1.49)
Prothrombin Time: 28 seconds — ABNORMAL HIGH (ref 11.6–15.2)

## 2014-11-04 MED ORDER — AMOXICILLIN-POT CLAVULANATE 500-125 MG PO TABS: 1.0000 | ORAL_TABLET | Freq: Three times a day (TID) | ORAL | Status: DC

## 2014-11-04 NOTE — Discharge Summary (Signed)
Physician Discharge Summary  JANDA CARGO LPF:[id number] DOB: 1940-03-30 DOA: 11/02/2014  PCP: [name], MD  Admit date: 11/02/2014 Discharge date: 11/04/2014  Time spent: 45 minutes  Recommendations for Outpatient Follow-up:  -Will be discharged home today. -Advised to follow up with PCP in no more than 2 weeks.   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 Darrick Meigs 11/02/2014: Today presents to the hospital with chief data shortness of breath which started yesterday morning. Patient was feeling very weak, she wears oxygen all the time was not able to ambulate much yesterday because of dyspnea. She denies nausea vomiting or diarrhea. No fever. In the ED patient was found to have CHF exacerbation as well as Pneumonia per chest x-ray. Patient started on IV antibiotics   Hospital Course:   Acute on Chronic Diastolic CHF -No longer volume overloaded on exam today. -Will revert back to home regimen of torsemide 100 mg daily and PRN metalazone as instructed by her cardiologist in the OP setting. -Is and Os are inaccurate as patient is incontinent of urine. -ECHO 3/16: Study Conclusions  Left ventricle: The cavity size was normal. There was mild concentric hypertrophy. Systolic function was normal. The estimated ejection fraction was in the range of 50% to 55%. Images were inadequate for LV wall motion assessment.  Diastolic dysfunction, indeterminate grade.  CAP -Has been afebrile. -All cx data is negative to date. -Will transition to 5 days of augmentin on DC which should not interfere with her coumadin.  H/o A Fib -Rate controlled. -Continue coumadin.  S/p MVR -Bioprosthetic valve.  Acute on CKD Stage IV -Baseline Cr around 1.7-1.9. -Cr is better than baseline at 1.4 today.   Procedures:  None   Consultations:  None  Discharge Instructions  Discharge Instructions    Diet - low sodium heart healthy    Complete by:  As directed      Increase activity slowly    Complete by:  As directed             Medication List    TAKE these medications        amiodarone 200 MG tablet  Commonly known as:  PACERONE  Take 0.5 tablets (100 mg total) by mouth daily.     amoxicillin-clavulanate 500-125 MG per tablet  Commonly known as:  AUGMENTIN  Take 1 tablet (500 mg total) by mouth 3 (three) times daily.     colchicine 0.6 MG tablet  Take 0.5 tablets (0.3 mg total) by mouth daily.     cyanocobalamin 1000 MCG/ML injection  Commonly known as:  (VITAMIN B-12)  Inject 1 mL into the muscle every 30 (thirty) days.     ferrous sulfate 325 (65 FE) MG tablet  Take 325 mg by mouth 2 (two) times daily with a meal.     levothyroxine 50 MCG tablet  Commonly known as:  SYNTHROID, LEVOTHROID  Take 50  mcg by mouth daily before breakfast.     metolazone 2.5 MG tablet  Commonly known as:  ZAROXOLYN  Take 1 tablet (2.5 mg total) by mouth as directed. TAKE 1 TABLET AS NEEDED ONLY IF YOUR WEIGHT INCREASES 2-3 POUNDS IN 24 HOURS.     omeprazole 20 MG capsule  Commonly known as:  PRILOSEC  Take 1 capsule (20 mg total) by mouth daily. Medicine to help protect your stomach lining from ulcers while on coumadin     potassium chloride SA 20 MEQ tablet  Commonly known as:  K-DUR,KLOR-CON  Take 1 tablet (20 mEq total) by mouth daily.     simvastatin 10 MG tablet  Commonly known as:  ZOCOR    TAKE ONE TABLET BY MOUTH EVERY EVENING.     tolterodine 4 MG 24 hr capsule  Commonly known as:  DETROL LA  Take 4 mg by mouth daily.     torsemide 100 MG tablet  Commonly known as:  DEMADEX  STARTING TOMORROW, TAKE A HALF A TABLET DAILY FOR 3 DAYS AND THEN RESTART ONE TABLET DAILY THEREAFTER.     warfarin 2.5 MG tablet  Commonly known as:  COUMADIN  Take 1.25-2.5 mg by mouth daily at 6 PM. Take 2.5mg  on Mondays, Wednesday, Friday. 1.25 mg on Tuesday,Thursday,Saturday,Sunday       Allergies  Allergen Reactions  . Indomethacin Other (See Comments)    dizziness  . Norvasc [Amlodipine Besylate] Cough       Follow-up Information    Follow up with [name], MD.   Specialty:  Family Medicine   Why:  as scheduled in 2 weeks   Contact information:   [place] [address] [phone]        The results of significant diagnostics from this hospitalization (including imaging, microbiology, ancillary and laboratory) are listed below for reference.    Significant Diagnostic Studies: Dg Chest 2 View  11/02/2014   CLINICAL DATA:  Shortness of breath. Decreased oxygen saturation since yesterday evening.  EXAM: CHEST  2 VIEW  COMPARISON:  05/08/2014  FINDINGS: Cardiac valve prosthesis. Shallow inspiration. Cardiac enlargement with mild pulmonary vascular congestion. Mild perihilar infiltration suggesting edema. Edema and consolidation pattern are improved since previous study. There is a small right pleural effusion with focal airspace disease in the right lung base which could represent superimposed pneumonia. No pneumothorax. Degenerative changes in the spine. Compression of an upper lumbar vertebra.  IMPRESSION: Cardiac enlargement with improved pulmonary congestion and edema compared with previous study. There is a small right pleural effusion with vague airspace disease suggested in the right lung base. This may indicate superimposed pneumonia.   Electronically Signed    By: Lucienne Capers M.D.   On: 11/02/2014 02:44    Microbiology: Recent Results (from the past 240 hour(s))  Culture, blood (routine x 2)     Status: None (Preliminary result)   Collection Time: 11/02/14  6:50 AM  Result Value Ref Range Status   Specimen Description LEFT ANTECUBITAL  Final   Special Requests   Final    BOTTLES DRAWN AEROBIC AND ANAEROBIC  AEB 6CC ANA 10CC   Culture NO GROWTH 2 DAYS  Final   Report Status PENDING  Incomplete  Culture, blood (routine x 2)     Status: None (Preliminary result)   Collection Time: 11/02/14  6:54 AM  Result Value Ref Range Status   Specimen Description BLOOD LEFT HAND  Final   Special Requests BOTTLES DRAWN AEROBIC AND  ANAEROBIC 8CC  Final   Culture NO GROWTH 2 DAYS  Final   Report Status PENDING  Incomplete     Labs: Basic Metabolic Panel:  Recent Labs Lab 11/02/14 0115 11/03/14 0556  NA 142 140  K 3.5 3.5  CL 102 101  CO2 34* 33*  GLUCOSE 98 109*  BUN 21* 20  CREATININE 1.59* 1.42*  CALCIUM 9.0 8.7*   Liver Function Tests:  Recent Labs Lab 11/03/14 0556  AST 18  ALT 9*  ALKPHOS 113  BILITOT 0.8  PROT 6.3*  ALBUMIN 2.9*   No results for input(s): LIPASE, AMYLASE in the last 168 hours. No results for input(s): AMMONIA in the last 168 hours. CBC:  Recent Labs Lab 11/02/14 0115 11/03/14 0556  WBC 4.9 6.0  NEUTROABS 3.2  --   HGB 10.9* 10.2*  HCT 34.1* 32.1*  MCV 96.9 97.9  PLT 158 142*   Cardiac Enzymes:  Recent Labs Lab 11/02/14 0115 11/02/14 0851 11/02/14 1328 11/02/14 1941  TROPONINI 0.03 0.03 0.03 <0.03   BNP: BNP (last 3 results)  Recent Labs  11/02/14 0115  BNP 1152.0*    ProBNP (last 3 results)  Recent Labs  11/29/13 0950 02/15/14 1040  PROBNP 2233.0* 2802.0*    CBG: No results for input(s): GLUCAP in the last 168 hours.     SignedLelon Frohlich  [facility] Pager: [phone] 11/04/2014, 11:41 AM

## 2014-11-04 NOTE — Progress Notes (Signed)
   11/04/14 1457  Vitals  Temp 98.9 F (37.2 C)  Temp Source Oral  BP (!) 148/56 mmHg  BP Location Left Arm  BP Method Automatic  Patient Position (if appropriate) Lying  Pulse Rate 81  Pulse Rate Source Dinamap  Resp (!) 22  Oxygen Therapy  SpO2 94 %  O2 Device Nasal Cannula  O2 Flow Rate (L/min) 2 L/min

## 2014-11-04 NOTE — Progress Notes (Signed)
Talking with the patient she states that she has an artificial knee and that it is stiff due to being in the bed.  She states that it will get better once she gets home moving around.

## 2014-11-04 NOTE — Care Management Note (Signed)
Case Management Note  Patient Details  Name: [name] [name] MRN: [id number] Date of Birth: Jul 07, 1940  Expected Discharge Date:  11/04/14               Expected Discharge Plan:  Home/Self Care  In-House Referral:  NA  Discharge planning Services  CM Consult  Post Acute Care Choice:  NA Choice offered to:  NA  DME Arranged:    DME Agency:     HH Arranged:    [place] Agency:     Status of Service:  Completed, signed off  Medicare Important Message Given:    Date Medicare IM Given:    Medicare IM give by:    Date Additional Medicare IM Given:    Additional Medicare Important Message give by:     If discussed at [place] of Stay Meetings, dates discussed:    Additional Comments: Pt admitted with CHF exacerbation. Pt is form home, lives with her grandson and is independent with ADL's. Pt uses a walker and has home O2 through [facility]. Pt plans to discharge home (hopefully today) with self care. No CM needs noted.  Sherald Barge, RN 11/04/2014, 11:39 AM

## 2014-11-04 NOTE — Progress Notes (Signed)
ANTICOAGULATION/ANTIBIOTIC CONSULT NOTE   Pharmacy Consult for COUMADIN, CEFTRIAXONE Indication: atrial fibrillation, CAP  Allergies  Allergen Reactions  . Indomethacin Other (See Comments)    dizziness  . Norvasc [Amlodipine Besylate] Cough    Patient Measurements: Height: 5\' 2"  (157.5 cm) Weight: 155 lb 1.9 oz (70.36 kg) IBW/kg (Calculated) : 50.1   Vital Signs: Temp: 99.1 F (37.3 C) (08/29 0540) Temp Source: Oral (08/29 0540) BP: 112/46 mmHg (08/29 0540) Pulse Rate: 63 (08/29 0540)  Labs:  Recent Labs  11/02/14 0115 11/02/14 0851 11/02/14 1328 11/02/14 1941 11/03/14 0556 11/04/14 0526  HGB 10.9*  --   --   --  10.2*  --   HCT 34.1*  --   --   --  32.1*  --   PLT 158  --   --   --  142*  --   LABPROT  --  26.0*  --   --  28.0* 28.0*  INR  --  2.41*  --   --  2.66* 2.67*  CREATININE 1.59*  --   --   --  1.42*  --   TROPONINI 0.03 0.03 0.03 <0.03  --   --     Estimated Creatinine Clearance: 32.4 mL/min (by C-G formula based on Cr of 1.42).   Medical History: Past Medical History  Diagnosis Date  . COPD (chronic obstructive pulmonary disease)   . History of TIA (transient ischemic attack)     Diagnosed 2003  . History of stroke     2000 [place]  . Obesity   . Juvenile rheumatic fever     Age 30  . Essential hypertension, benign   . Hyperlipidemia   . Mitral regurgitation     Status post bioprosthetic MVR (05/2013)  . CKD (chronic kidney disease) stage 4, GFR 15-29 ml/min   . Paroxysmal atrial fibrillation     s/p MAZE (05/2013)  . Chronic diastolic congestive heart failure     a) ECHO (09/2013) EF 50-55%  . Arthritis   . Aortic insufficiency due to bicuspid aortic valve     Moderate by TEE  . 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 S/P Maze operation for atrial fibrillation 05/16/2013    Complete  bilateral atrial lesion set using cryothermy and bipolar radiofrequency ablation with oversewing of LA appendage  . NICM (nonischemic cardiomyopathy)     a) LHC (04/2013) no significant CAD    Medications:  Prescriptions prior to admission  Medication Sig Dispense Refill Last Dose  . amiodarone (PACERONE) 200 MG tablet Take 0.5 tablets (100 mg total) by mouth daily. 30 tablet 6 11/01/2014 at Unknown time  . colchicine 0.6 MG tablet Take 0.5 tablets (0.3 mg total) by mouth daily. 30 tablet 0 11/01/2014 at Unknown time  . cyanocobalamin (,VITAMIN B-12,) 1000 MCG/ML injection Inject 1 mL into the muscle every 30 (thirty) days.  12 Past Month at Unknown time  . ferrous sulfate 325 (65 FE) MG tablet Take 325 mg by mouth 2 (two) times daily with a meal.   11/01/2014 at Unknown time  . levothyroxine (SYNTHROID, LEVOTHROID) 50 MCG tablet Take 50 mcg by mouth daily before breakfast.   11/01/2014 at Unknown time  . omeprazole (PRILOSEC) 20 MG capsule Take 1 capsule (20 mg total) by mouth daily. Medicine to help protect your stomach lining from ulcers while on coumadin 30  capsule 3 11/01/2014 at Unknown time  . potassium chloride SA (K-DUR,KLOR-CON) 20 MEQ tablet Take 1 tablet (20 mEq total) by mouth daily. 60 tablet 6 11/01/2014 at Unknown time  . simvastatin (ZOCOR) 10 MG tablet TAKE ONE TABLET BY MOUTH EVERY EVENING. 30 tablet 3 11/01/2014 at Unknown time  . tolterodine (DETROL LA) 4 MG 24 hr capsule Take 4 mg by mouth daily.   11/01/2014 at Unknown time  . torsemide (DEMADEX) 100 MG tablet STARTING TOMORROW, TAKE A HALF A TABLET DAILY FOR 3 DAYS AND THEN RESTART ONE TABLET DAILY THEREAFTER. (Patient taking differently: Take 100 mg by mouth every morning. )   11/01/2014 at Unknown time  . warfarin (COUMADIN) 2.5 MG tablet Take 1.25-2.5 mg by mouth daily at 6 PM. Take 2.5mg  on Mondays, Wednesday, Friday. 1.25 mg on Tuesday,Thursday,Saturday,Sunday   11/01/2014 at 1800  . metolazone (ZAROXOLYN) 2.5 MG tablet Take 1  tablet (2.5 mg total) by mouth as directed. TAKE 1 TABLET AS NEEDED ONLY IF YOUR WEIGHT INCREASES 2-3 POUNDS IN 24 HOURS. (Patient taking differently: Take 2.5 mg by mouth daily. Marland Kitchen)   More than a month at Unknown time    Assessment: 74 yo lady to continue coumadin for afib and rocephin for CAP.  Her INR remains therapeutic on home coumadin regimen.   Goal of Therapy:  INR 2-3 Monitor platelets by anticoagulation protocol: Yes  Eradication of infection   Plan:  Cont home dose of coumadin Cont rocephin 1 gm IV q24 hours Daily PT/INR F/u cultures and clinical course  Kaliq Lege P[place]et 11/04/2014,8:17 AM

## 2014-11-04 NOTE — Patient Outreach (Signed)
[facility]) Care Management  11/04/2014  [name] [name] 18-Apr-1940 [id number]   No response from patient after 3 outreach calls and letter.  Plan: RN Health Coach will forward patient information to Lurline Del for case closure.   RN Health Coach will send letter to primary care physician notifying of case closure.  [name], RN, MSN [place] [phone]

## 2014-11-04 NOTE — Progress Notes (Signed)
Notified Dr. Jerilee Hoh of the patients daughter calling and voicing more concerns about the patient being discharged.  She is concerned about the patient being too weak to go home. She requested the patient have a recheck of her xray. The patient was ambulated approx. 10 feet and she stated that she could not go any further at this time as she stated her knees felt that they would "lock up."  She has her O2 on at 2 LPM and had no c/o dizziness or SOB at the time of ambulation.  Voiced to the MD the most recent vital signs which included the O2 saturation.  The MD stated that the patient is medically stable to go home.  She states that according to her her exam this am that she did not need another xray.  She voices that due her age she may have some weakness, but according to the patient and her sister she is at baseline.  MD states that if the family is concerns about the patients physical strength that we could have physical therapy to come see her to see if she could benefit from rehab at an SNF.  I verbalized understanding then notified the patients daughter [name] [name] and voiced to her the conversation that was had between me and the doctor.  [name] [name] verbalized understanding and stated that she would have her daughter come pick up the patient.

## 2014-11-04 NOTE — Patient Outreach (Signed)
[place] [facility]) Care Management  11/04/2014  [name] [name] [birth year] [id number]   Patient is currently hospitalized and [facility] Case will not closed at this time.    Plan: Will send message to CMA to notify of hospitalization.  Patient will need to be referred to community case management of transition of care.    [name], RN, MSN [place] [phone]

## 2014-11-04 NOTE — Progress Notes (Signed)
Notified Dr. Jerilee Hoh of the patients family c/o the patient still being weak and of the patient not eating since yesterday morning.  The MD states that she spoke with the patient and the patients sister and explained the plan of care and they voiced no further complaints or concerns at that time.  She says that the patient states she has no dizziness with ambulation.  The patient expressed to me this am that she wanted to go home.  Suggestion from the MD to ambulate the patient to see how she does.  The patient will be ambulated on O2 because she is on 2 LPM at home.

## 2014-11-04 NOTE — Patient Outreach (Signed)
[place]) Care Management  11/04/2014  [name] [name] [birth year] [id number]   Request from [name], RN to assign Community RN due patient admitted to [facility], assigned [name], RN.  Thanks, [name]. [place], [place] Assistant Phone: [phone] Fax: [phone]

## 2014-11-04 NOTE — Addendum Note (Signed)
Addended by: [name] on: 11/04/2014 03:32 PM   Modules accepted: Orders

## 2014-11-05 ENCOUNTER — Other Ambulatory Visit: Payer: Self-pay | Admitting: *Deleted

## 2014-11-05 NOTE — Patient Outreach (Signed)
11/05/14-Telephone call to patient for transition of care week 1, spoke with pt, HIPAA verified, pt reports she has all medications and taking as prescribed and her granddaughter prefills medications and oversees, pt states she cannot tell RN CM exactly what she is taking because her granddaughter is not present, pt states at home visit next week she will have all the medication for RN CM to see.  Pt reports she is weighing daily.  RN CM reviewed CHF action plan and when to call MD.  [facility] CM Care Plan Problem One        Most Recent Value   Care Plan Problem One  Knowledge deficity related to CHF disease process   Role Documenting the Problem One  Care Management Coordinator   Care Plan for Problem One  Active   [facility]N Long Term Goal (31-90 days)  pt will have no admissions related to heart failure within 90 days.   THN Long Term Goal Start Date  11/05/14   Interventions for Problem One Long Term Goal  RN CM reviewed importance of calling MD early for change in health status and being mindful of weight gain, edema, dyspnea.   [facility]N CM Short Term Goal #1 (0-30 days)  pt will follow up with Dr. Quintin Alto within 2 weeks   [facility] CM Short Term Goal #1 Start Date  11/05/14   [facility] CM Short Term Goal #2 (0-30 days)  RN CM reviewed importance of seeing primary care provider post hospital discharge within 7-10 days    RN CM faxed transition of care to Dr. Quintin Alto.  PLAN See pt for initial home visit 11/15/14 (transition of care week 2).  [name] [facility], [place] Coordinator [phone]

## 2014-11-07 LAB — CULTURE, BLOOD (ROUTINE X 2)
CULTURE: NO GROWTH
CULTURE: NO GROWTH

## 2014-11-15 ENCOUNTER — Encounter: Payer: Self-pay | Admitting: *Deleted

## 2014-11-15 ENCOUNTER — Other Ambulatory Visit: Payer: Self-pay | Admitting: *Deleted

## 2014-11-15 NOTE — Patient Outreach (Signed)
[name]) Care Management   11/15/2014  [name] [name] 1941/01/16 [id number]  [name] [name] is an 74 y.o. female  Subjective: Initial home visit with pt, HIIPA verified, pt reports " I'm managing pretty well here at home and this is where I want to stay"  Granddaughter and daughter assist pt as needed, granddaughter prefills med box.  Pt states her financial situation is a little better and if no major life crisis happens, can manage "pretty good" but verbalizes she knows her situation can change if she gets sick or goes to [name] or "something breaks down at my house".   Objective:   Filed Vitals:   11/15/14 1157  BP: 126/62  Pulse: 76  Resp: 16  Height: 1.575 m (5\' 2" )  Weight: 159 lb (72.122 kg)  SpO2: 93%   ROS  Physical Exam  Constitutional: She is oriented to person, place, and time. She appears well-developed and well-nourished.  HENT:  Head: Normocephalic.  Neck: Normal range of motion.  Cardiovascular: Normal rate and regular rhythm.   Respiratory: Effort normal and breath sounds normal.  GI: Soft. Bowel sounds are normal.  Musculoskeletal: Normal range of motion. She exhibits edema.  1+ edema lower extremities bil, legs tight.  Neurological: She is alert and oriented to person, place, and time.  Skin: Skin is warm and dry.  Psychiatric: She has a normal mood and affect. Her behavior is normal. Judgment and thought content normal.    Current Medications:   Current Outpatient Prescriptions  Medication Sig Dispense Refill  . amiodarone (PACERONE) 200 MG tablet Take 0.5 tablets (100 mg total) by mouth daily. 30 tablet 6  . colchicine 0.6 MG tablet Take 0.5 tablets (0.3 mg total) by mouth daily. 30 tablet 0  . cyanocobalamin (,VITAMIN B-12,) 1000 MCG/ML injection Inject 1 mL into the muscle every 30 (thirty) days.  12  . ferrous sulfate 325 (65 FE) MG tablet Take 325 mg by mouth 2 (two) times daily with a meal.    . levothyroxine  (SYNTHROID, LEVOTHROID) 50 MCG tablet Take 50 mcg by mouth daily before breakfast.    . metolazone (ZAROXOLYN) 2.5 MG tablet Take 1 tablet (2.5 mg total) by mouth as directed. TAKE 1 TABLET AS NEEDED ONLY IF YOUR WEIGHT INCREASES 2-3 POUNDS IN 24 HOURS. (Patient taking differently: Take 2.5 mg by mouth daily. Marland Kitchen)    . omeprazole (PRILOSEC) 20 MG capsule Take 1 capsule (20 mg total) by mouth daily. Medicine to help protect your stomach lining from ulcers while on coumadin 30 capsule 3  . potassium chloride SA (K-DUR,KLOR-CON) 20 MEQ tablet Take 1 tablet (20 mEq total) by mouth daily. 60 tablet 6  . simvastatin (ZOCOR) 10 MG tablet TAKE ONE TABLET BY MOUTH EVERY EVENING. 30 tablet 3  . tolterodine (DETROL LA) 4 MG 24 hr capsule Take 4 mg by mouth daily.    Marland Kitchen torsemide (DEMADEX) 100 MG tablet STARTING TOMORROW, TAKE A HALF A TABLET DAILY FOR 3 DAYS AND THEN RESTART ONE TABLET DAILY THEREAFTER. (Patient taking differently: Take 100 mg by mouth every morning. )    . warfarin (COUMADIN) 2.5 MG tablet Take 1.25-2.5 mg by mouth daily at 6 PM. Take 2.5mg  on Mondays, Wednesday, Friday. 1.25 mg on Tuesday,Thursday,Saturday,Sunday    . amoxicillin-clavulanate (AUGMENTIN) 500-125 MG per tablet Take 1 tablet (500 mg total) by mouth 3 (three) times daily. (Patient not taking: Reported on 11/15/2014) 5 tablet 0   No current facility-administered medications for this visit.  Functional Status:   In your present state of health, do you have any difficulty performing the following activities: 11/15/2014 11/02/2014  Hearing? N N  Vision? N N  Difficulty concentrating or making decisions? N N  Walking or climbing stairs? Y Y  Dressing or bathing? Y N  Doing errands, shopping? [name] [name]  Preparing Food and eating ? N -  Using the Toilet? N -  In the past six months, have you accidently leaked urine? Y -  Do you have problems with loss of bowel control? N -  Managing your Medications? Y -  Managing your Finances? N -   Housekeeping or managing your Housekeeping? Y -    Fall/Depression Screening:    PHQ 2/9 Scores 11/15/2014 08/19/2014 06/07/2014  PHQ - 2 Score 0 0 0   Fall Risk  11/15/2014 08/19/2014 07/02/2014 06/07/2014  Falls in the past year? No No No (No Data)  Number falls in past yr: - - (No Data) -  Injury with Fall? - - No -  Risk for fall due to : Impaired balance/gait - Impaired balance/gait -   Assessment:  RN CM faxed today's initial home visit and barrier letter to primary MD, Dr. Quintin Alto, RN CM reviewed all medications/ bottles and torsemide and prilosec bottle not in with other medications, telephone call to [name] [name], spoke with [name] [name] who reports both these medications being filled today and sent to patient's home, pt granddaughter has medication in prefilled med box so pt has not been without any of her medication.  RN CM provided 24 hour nurse line magnet and reminded pt of her resources to call for questions, change in health status.  Pt desires to be discharged from [name] [name] services citing she has no further needs, pt doing well with finances (has enough for mortgage, food, power bill, etc) but pt does not have any extra money left over each month.  RN CM sent In Basket to [name] [name] to inform.  [facility] CM Care Plan Problem One        Most Recent Value   Care Plan Problem One  Knowledge deficity related to CHF disease process   Role Documenting the Problem One  Care Management Coordinator   Care Plan for Problem One  Active   THN Long Term Goal (31-90 days)  pt will have no admissions related to heart failure within 90 days.   THN Long Term Goal Start Date  11/05/14   Interventions for Problem One Long Term Goal  RN CM reinforced importance of calling MD early for change in health status and being mindful of weight gain, edema, dyspnea.   [facility] CM Short Term Goal #1 (0-30 days)  pt will follow up with Dr. Quintin [name] within 2 weeks   [name] CM Short Term Goal #1 Start Date  11/05/14   Interventions  for Short Term Goal #1  RN CM reminded pt she is to see primary MD 11/18/14   [facility] CM Short Term Goal #2 (0-30 days)  pt will verbalize signs/ symptoms pneumonia/ exacerbation within 30 days.   [facility]N CM Short Term Goal #2 Start Date  11/15/14   Interventions for Short Term Goal #2  RN CM reviewed EMMI handout "Pneumonia Aftercare" and signs/ symptoms pneumonia, prevention.      Plan: continue weekly transition of care calls Follow up with home visit 12/12/14  [name] [facility], [place] Coordinator [phone]

## 2014-11-18 DIAGNOSIS — H527 Unspecified disorder of refraction: Secondary | ICD-10-CM | POA: Diagnosis not present

## 2014-11-18 DIAGNOSIS — Z961 Presence of intraocular lens: Secondary | ICD-10-CM | POA: Diagnosis not present

## 2014-11-18 DIAGNOSIS — H26491 Other secondary cataract, right eye: Secondary | ICD-10-CM | POA: Diagnosis not present

## 2014-11-18 DIAGNOSIS — H02834 Dermatochalasis of left upper eyelid: Secondary | ICD-10-CM | POA: Diagnosis not present

## 2014-11-18 DIAGNOSIS — H25812 Combined forms of age-related cataract, left eye: Secondary | ICD-10-CM | POA: Diagnosis not present

## 2014-11-18 DIAGNOSIS — H02831 Dermatochalasis of right upper eyelid: Secondary | ICD-10-CM | POA: Diagnosis not present

## 2014-11-19 ENCOUNTER — Ambulatory Visit (INDEPENDENT_AMBULATORY_CARE_PROVIDER_SITE_OTHER): Payer: Commercial Managed Care - HMO | Admitting: *Deleted

## 2014-11-19 DIAGNOSIS — I5033 Acute on chronic diastolic (congestive) heart failure: Secondary | ICD-10-CM | POA: Diagnosis not present

## 2014-11-19 DIAGNOSIS — Z9889 Other specified postprocedural states: Secondary | ICD-10-CM | POA: Diagnosis not present

## 2014-11-19 DIAGNOSIS — I639 Cerebral infarction, unspecified: Secondary | ICD-10-CM

## 2014-11-19 DIAGNOSIS — I4891 Unspecified atrial fibrillation: Secondary | ICD-10-CM

## 2014-11-19 DIAGNOSIS — Z8679 Personal history of other diseases of the circulatory system: Secondary | ICD-10-CM

## 2014-11-19 DIAGNOSIS — Z7901 Long term (current) use of anticoagulants: Secondary | ICD-10-CM

## 2014-11-19 DIAGNOSIS — I5043 Acute on chronic combined systolic (congestive) and diastolic (congestive) heart failure: Secondary | ICD-10-CM | POA: Diagnosis not present

## 2014-11-19 DIAGNOSIS — Z953 Presence of xenogenic heart valve: Secondary | ICD-10-CM | POA: Diagnosis not present

## 2014-11-19 LAB — POCT INR: INR: 3.2

## 2014-11-21 DIAGNOSIS — I10 Essential (primary) hypertension: Secondary | ICD-10-CM | POA: Diagnosis not present

## 2014-11-22 ENCOUNTER — Other Ambulatory Visit: Payer: Self-pay | Admitting: *Deleted

## 2014-11-22 DIAGNOSIS — I509 Heart failure, unspecified: Secondary | ICD-10-CM | POA: Diagnosis not present

## 2014-11-22 NOTE — Patient Outreach (Signed)
11/22/14- telephone call to [name] [name] for transition of care week 3, spoke with [name] [name], HIIPA verified, [name] [name] states she saw Dr. Quintin Alto 11/18/14 and no changes made, weight today 157 pounds, denies dyspnea, has slight edema, [name] [name] to have cataract removed 12/05/14 and may be staying with her daughter after this because eye drops need to be given QID.  [name] [name] states she has no new needs or concerns.  [facility] CM Care Plan Problem One        Most Recent Value   Care Plan Problem One  Knowledge deficity related to CHF disease process   Role Documenting the Problem One  Care Management Coordinator   Care Plan for Problem One  Active   THN Long Term Goal (31-90 days)  [name] [name] will have no admissions related to heart failure within 90 days.   THN Long Term Goal Start Date  11/05/14   Interventions for Problem One Long Term Goal  RN CM reiterated importance of calling MD early for change in health status and being mindful of weight gain, edema, dyspnea.   [facility] CM Short Term Goal #1 (0-30 days)  [name] [name] will follow up with Dr. Quintin Alto within 2 weeks   [facility] CM Short Term Goal #1 Start Date  11/05/14   [facility] CM Short Term Goal #1 Met Date  11/22/14 [[name] [name] saw MD 11/18/14]   [facility]N CM Short Term Goal #2 (0-30 days)  [name] [name] will verbalize signs/ symptoms pneumonia/ exacerbation within 30 days.   [facility] CM Short Term Goal #2 Start Date  11/15/14   Interventions for Short Term Goal #2  RN CM reviewed signs/ symptoms pneumonia exacerbation     PLAN Continue weekly transition of care call See [name] [name] for home visit 12/12/14  [name] [facility], [place] Coordinator [phone]

## 2014-11-26 DIAGNOSIS — Z8701 Personal history of pneumonia (recurrent): Secondary | ICD-10-CM | POA: Diagnosis not present

## 2014-11-26 DIAGNOSIS — I4891 Unspecified atrial fibrillation: Secondary | ICD-10-CM | POA: Diagnosis not present

## 2014-11-26 DIAGNOSIS — I5033 Acute on chronic diastolic (congestive) heart failure: Secondary | ICD-10-CM | POA: Diagnosis not present

## 2014-11-26 DIAGNOSIS — I10 Essential (primary) hypertension: Secondary | ICD-10-CM | POA: Diagnosis not present

## 2014-11-27 DIAGNOSIS — I5033 Acute on chronic diastolic (congestive) heart failure: Secondary | ICD-10-CM | POA: Diagnosis not present

## 2014-11-27 DIAGNOSIS — Z8701 Personal history of pneumonia (recurrent): Secondary | ICD-10-CM | POA: Diagnosis not present

## 2014-11-27 DIAGNOSIS — I4891 Unspecified atrial fibrillation: Secondary | ICD-10-CM | POA: Diagnosis not present

## 2014-11-27 DIAGNOSIS — I10 Essential (primary) hypertension: Secondary | ICD-10-CM | POA: Diagnosis not present

## 2014-11-28 DIAGNOSIS — Z8701 Personal history of pneumonia (recurrent): Secondary | ICD-10-CM | POA: Diagnosis not present

## 2014-11-28 DIAGNOSIS — I5033 Acute on chronic diastolic (congestive) heart failure: Secondary | ICD-10-CM | POA: Diagnosis not present

## 2014-11-28 DIAGNOSIS — I4891 Unspecified atrial fibrillation: Secondary | ICD-10-CM | POA: Diagnosis not present

## 2014-11-28 DIAGNOSIS — I10 Essential (primary) hypertension: Secondary | ICD-10-CM | POA: Diagnosis not present

## 2014-11-29 ENCOUNTER — Other Ambulatory Visit: Payer: Self-pay | Admitting: *Deleted

## 2014-11-29 ENCOUNTER — Ambulatory Visit: Payer: Commercial Managed Care - HMO | Admitting: *Deleted

## 2014-11-29 DIAGNOSIS — R531 Weakness: Secondary | ICD-10-CM | POA: Diagnosis not present

## 2014-11-29 NOTE — Patient Outreach (Signed)
11/29/14- Telephone call to patient for transition of care week 4, no answer to home phone and no option to leave voicemail,  Called cell phone and received message "voicemail not set up".  PLAN See pt for scheduled home visit on 12/12/14  [name] [facility], [place] Coordinator [phone]

## 2014-12-03 DIAGNOSIS — Z8701 Personal history of pneumonia (recurrent): Secondary | ICD-10-CM | POA: Diagnosis not present

## 2014-12-03 DIAGNOSIS — I5033 Acute on chronic diastolic (congestive) heart failure: Secondary | ICD-10-CM | POA: Diagnosis not present

## 2014-12-03 DIAGNOSIS — I10 Essential (primary) hypertension: Secondary | ICD-10-CM | POA: Diagnosis not present

## 2014-12-03 DIAGNOSIS — I4891 Unspecified atrial fibrillation: Secondary | ICD-10-CM | POA: Diagnosis not present

## 2014-12-04 DIAGNOSIS — I10 Essential (primary) hypertension: Secondary | ICD-10-CM | POA: Diagnosis not present

## 2014-12-04 DIAGNOSIS — J189 Pneumonia, unspecified organism: Secondary | ICD-10-CM | POA: Diagnosis not present

## 2014-12-04 DIAGNOSIS — I4891 Unspecified atrial fibrillation: Secondary | ICD-10-CM | POA: Diagnosis not present

## 2014-12-04 DIAGNOSIS — Z8701 Personal history of pneumonia (recurrent): Secondary | ICD-10-CM | POA: Diagnosis not present

## 2014-12-04 DIAGNOSIS — I482 Chronic atrial fibrillation: Secondary | ICD-10-CM | POA: Diagnosis not present

## 2014-12-04 DIAGNOSIS — I5033 Acute on chronic diastolic (congestive) heart failure: Secondary | ICD-10-CM | POA: Diagnosis not present

## 2014-12-05 DIAGNOSIS — Z888 Allergy status to other drugs, medicaments and biological substances status: Secondary | ICD-10-CM | POA: Diagnosis not present

## 2014-12-05 DIAGNOSIS — H25812 Combined forms of age-related cataract, left eye: Secondary | ICD-10-CM | POA: Diagnosis not present

## 2014-12-05 DIAGNOSIS — M1990 Unspecified osteoarthritis, unspecified site: Secondary | ICD-10-CM | POA: Diagnosis not present

## 2014-12-05 DIAGNOSIS — E039 Hypothyroidism, unspecified: Secondary | ICD-10-CM | POA: Diagnosis not present

## 2014-12-05 DIAGNOSIS — Z8673 Personal history of transient ischemic attack (TIA), and cerebral infarction without residual deficits: Secondary | ICD-10-CM | POA: Diagnosis not present

## 2014-12-05 DIAGNOSIS — I10 Essential (primary) hypertension: Secondary | ICD-10-CM | POA: Diagnosis not present

## 2014-12-05 DIAGNOSIS — I509 Heart failure, unspecified: Secondary | ICD-10-CM | POA: Diagnosis not present

## 2014-12-05 DIAGNOSIS — E785 Hyperlipidemia, unspecified: Secondary | ICD-10-CM | POA: Diagnosis not present

## 2014-12-05 DIAGNOSIS — J449 Chronic obstructive pulmonary disease, unspecified: Secondary | ICD-10-CM | POA: Diagnosis not present

## 2014-12-06 DIAGNOSIS — I4891 Unspecified atrial fibrillation: Secondary | ICD-10-CM | POA: Diagnosis not present

## 2014-12-06 DIAGNOSIS — Z8701 Personal history of pneumonia (recurrent): Secondary | ICD-10-CM | POA: Diagnosis not present

## 2014-12-06 DIAGNOSIS — I5033 Acute on chronic diastolic (congestive) heart failure: Secondary | ICD-10-CM | POA: Diagnosis not present

## 2014-12-06 DIAGNOSIS — I10 Essential (primary) hypertension: Secondary | ICD-10-CM | POA: Diagnosis not present

## 2014-12-11 DIAGNOSIS — I10 Essential (primary) hypertension: Secondary | ICD-10-CM | POA: Diagnosis not present

## 2014-12-11 DIAGNOSIS — I4891 Unspecified atrial fibrillation: Secondary | ICD-10-CM | POA: Diagnosis not present

## 2014-12-11 DIAGNOSIS — I5033 Acute on chronic diastolic (congestive) heart failure: Secondary | ICD-10-CM | POA: Diagnosis not present

## 2014-12-11 DIAGNOSIS — Z8701 Personal history of pneumonia (recurrent): Secondary | ICD-10-CM | POA: Diagnosis not present

## 2014-12-12 ENCOUNTER — Encounter: Payer: Self-pay | Admitting: *Deleted

## 2014-12-12 ENCOUNTER — Other Ambulatory Visit: Payer: Self-pay | Admitting: *Deleted

## 2014-12-12 NOTE — Patient Outreach (Signed)
[place] [facility]) Care Management   12/12/2014  [name] [name] 22-Feb-1941 [id number]  [name] [name] is an 74 y.o. female  Subjective: Initial home visit with pt, HIPAA verified, pt reports she gained up to 164 pounds but took her diuretics as instructed and weight is back down to 160 pounds.  Pt states she had cataract removed 12/05/14 and is using drops and cream in her left eye.  Pt states she did receive flu vaccine, patient's daughter present and states pt is weighing but not always recording.  Pt states home health discharged last week.  Objective:   Filed Vitals:   12/12/14 1346  BP: 128/58  Pulse: 56  Resp: 16  Weight: 160 lb (72.576 kg)  SpO2: 98%   ROS  Physical Exam  Constitutional: She is oriented to person, place, and time. She appears well-developed and well-nourished.  HENT:  Head: Normocephalic.  Neck: Normal range of motion. Neck supple.  Cardiovascular: Normal rate and regular rhythm.   Respiratory: Effort normal and breath sounds normal.  Dyspnea with exertion  GI: Soft. Bowel sounds are normal.  Musculoskeletal: Normal range of motion. She exhibits edema.  1+ edema lower extremities bil.  Neurological: She is alert and oriented to person, place, and time.  Skin: Skin is warm and dry.  Psychiatric: She has a normal mood and affect. Her behavior is normal. Judgment and thought content normal.    Current Medications:   Current Outpatient Prescriptions  Medication Sig Dispense Refill  . amiodarone (PACERONE) 200 MG tablet Take 0.5 tablets (100 mg total) by mouth daily. 30 tablet 6  . colchicine 0.6 MG tablet Take 0.5 tablets (0.3 mg total) by mouth daily. 30 tablet 0  . cyanocobalamin (,VITAMIN B-12,) 1000 MCG/ML injection Inject 1 mL into the muscle every 30 (thirty) days.  12  . ferrous sulfate 325 (65 FE) MG tablet Take 325 mg by mouth 2 (two) times daily with a meal.    . levothyroxine (SYNTHROID, LEVOTHROID) 50 MCG tablet Take  50 mcg by mouth daily before breakfast.    . metolazone (ZAROXOLYN) 2.5 MG tablet Take 1 tablet (2.5 mg total) by mouth as directed. TAKE 1 TABLET AS NEEDED ONLY IF YOUR WEIGHT INCREASES 2-3 POUNDS IN 24 HOURS. (Patient taking differently: Take 2.5 mg by mouth daily. Marland Kitchen)    . omeprazole (PRILOSEC) 20 MG capsule Take 1 capsule (20 mg total) by mouth daily. Medicine to help protect your stomach lining from ulcers while on coumadin 30 capsule 3  . potassium chloride SA (K-DUR,KLOR-CON) 20 MEQ tablet Take 1 tablet (20 mEq total) by mouth daily. 60 tablet 6  . simvastatin (ZOCOR) 10 MG tablet TAKE ONE TABLET BY MOUTH EVERY EVENING. 30 tablet 3  . tolterodine (DETROL LA) 4 MG 24 hr capsule Take 4 mg by mouth daily.    Marland Kitchen torsemide (DEMADEX) 100 MG tablet STARTING TOMORROW, TAKE A HALF A TABLET DAILY FOR 3 DAYS AND THEN RESTART ONE TABLET DAILY THEREAFTER. (Patient taking differently: Take 100 mg by mouth every morning. )    . warfarin (COUMADIN) 2.5 MG tablet Take 1.25-2.5 mg by mouth daily at 6 PM. Take 2.59m on Mondays, Wednesday, Friday. 1.25 mg on Tuesday,Thursday,Saturday,Sunday    . amoxicillin-clavulanate (AUGMENTIN) 500-125 MG per tablet Take 1 tablet (500 mg total) by mouth 3 (three) times daily. (Patient not taking: Reported on 11/15/2014) 5 tablet 0   No current facility-administered medications for this visit.    Functional Status:   In your  present state of health, do you have any difficulty performing the following activities: 11/15/2014 11/02/2014  Hearing? N N  Vision? N N  Difficulty concentrating or making decisions? N N  Walking or climbing stairs? Y Y  Dressing or bathing? Y N  Doing errands, shopping? [name]  Preparing Food and eating ? N -  Using the Toilet? N -  In the past six months, have you accidently leaked urine? Y -  Do you have problems with loss of bowel control? N -  Managing your Medications? Y -  Managing your Finances? N -  Housekeeping or managing your Housekeeping? Y  -    Fall/Depression Screening:    PHQ 2/9 Scores 11/15/2014 08/19/2014 06/07/2014  PHQ - 2 Score 0 0 0    Assessment:  RN CM continues to reinforce management of CHF, importance of daily weights and recording.  RN CM reviewed EMMI handouts with pt- HF: Working with MD, What is HF?  [facility] CM Care Plan Problem One        Most Recent Value   Care Plan Problem One  Knowledge deficity related to CHF disease process   Role Documenting the Problem One  Care Management Coordinator   Care Plan for Problem One  Active   THN Long Term Goal (31-90 days)  pt will have no admissions related to heart failure within 90 days.   THN Long Term Goal Start Date  11/05/14   Interventions for Problem One Long Term Goal  RN CM reviewed with pt and daughter importance of calling MD early for change in health status and being mindful of weight gain, edema, dyspnea.   [facility] CM Short Term Goal #1 Met Date  -- [pt saw MD 11/18/14]   [facility]N CM Short Term Goal #2 (0-30 days)  pt will verbalize signs/ symptoms pneumonia/ exacerbation within 30 days.   [facility]N CM Short Term Goal #2 Start Date  12/12/14 [goal restarted- pt needs review]   Interventions for Short Term Goal #2  RN CM reiterated signs/ symptoms pneumonia exacerbation      Plan:  Follow up with home visit 01/15/15 Assess weight, edema Continue reinforcement CHF/ pneumonia  [name] [facility], [place] Coordinator [phone]

## 2014-12-13 DIAGNOSIS — H25812 Combined forms of age-related cataract, left eye: Secondary | ICD-10-CM | POA: Diagnosis not present

## 2014-12-13 DIAGNOSIS — Z961 Presence of intraocular lens: Secondary | ICD-10-CM | POA: Diagnosis not present

## 2014-12-13 DIAGNOSIS — I10 Essential (primary) hypertension: Secondary | ICD-10-CM | POA: Diagnosis not present

## 2014-12-13 DIAGNOSIS — I4891 Unspecified atrial fibrillation: Secondary | ICD-10-CM | POA: Diagnosis not present

## 2014-12-13 DIAGNOSIS — I5033 Acute on chronic diastolic (congestive) heart failure: Secondary | ICD-10-CM | POA: Diagnosis not present

## 2014-12-13 DIAGNOSIS — Z8701 Personal history of pneumonia (recurrent): Secondary | ICD-10-CM | POA: Diagnosis not present

## 2014-12-16 DIAGNOSIS — Z8701 Personal history of pneumonia (recurrent): Secondary | ICD-10-CM | POA: Diagnosis not present

## 2014-12-16 DIAGNOSIS — I10 Essential (primary) hypertension: Secondary | ICD-10-CM | POA: Diagnosis not present

## 2014-12-16 DIAGNOSIS — I4891 Unspecified atrial fibrillation: Secondary | ICD-10-CM | POA: Diagnosis not present

## 2014-12-16 DIAGNOSIS — I5033 Acute on chronic diastolic (congestive) heart failure: Secondary | ICD-10-CM | POA: Diagnosis not present

## 2014-12-17 ENCOUNTER — Ambulatory Visit (INDEPENDENT_AMBULATORY_CARE_PROVIDER_SITE_OTHER): Payer: Commercial Managed Care - HMO | Admitting: *Deleted

## 2014-12-17 DIAGNOSIS — I5033 Acute on chronic diastolic (congestive) heart failure: Secondary | ICD-10-CM

## 2014-12-17 DIAGNOSIS — Z7901 Long term (current) use of anticoagulants: Secondary | ICD-10-CM | POA: Diagnosis not present

## 2014-12-17 DIAGNOSIS — Z953 Presence of xenogenic heart valve: Secondary | ICD-10-CM

## 2014-12-17 DIAGNOSIS — Z8679 Personal history of other diseases of the circulatory system: Secondary | ICD-10-CM

## 2014-12-17 DIAGNOSIS — Z9889 Other specified postprocedural states: Secondary | ICD-10-CM | POA: Diagnosis not present

## 2014-12-17 DIAGNOSIS — I4891 Unspecified atrial fibrillation: Secondary | ICD-10-CM

## 2014-12-17 LAB — POCT INR: INR: 2.8

## 2014-12-18 DIAGNOSIS — I5033 Acute on chronic diastolic (congestive) heart failure: Secondary | ICD-10-CM | POA: Diagnosis not present

## 2014-12-18 DIAGNOSIS — E876 Hypokalemia: Secondary | ICD-10-CM | POA: Diagnosis not present

## 2014-12-18 DIAGNOSIS — I4891 Unspecified atrial fibrillation: Secondary | ICD-10-CM | POA: Diagnosis not present

## 2014-12-18 DIAGNOSIS — Z23 Encounter for immunization: Secondary | ICD-10-CM | POA: Diagnosis not present

## 2014-12-18 DIAGNOSIS — I5022 Chronic systolic (congestive) heart failure: Secondary | ICD-10-CM | POA: Diagnosis not present

## 2014-12-18 DIAGNOSIS — I10 Essential (primary) hypertension: Secondary | ICD-10-CM | POA: Diagnosis not present

## 2014-12-18 DIAGNOSIS — J189 Pneumonia, unspecified organism: Secondary | ICD-10-CM | POA: Diagnosis not present

## 2014-12-18 DIAGNOSIS — Z8701 Personal history of pneumonia (recurrent): Secondary | ICD-10-CM | POA: Diagnosis not present

## 2014-12-18 DIAGNOSIS — I482 Chronic atrial fibrillation: Secondary | ICD-10-CM | POA: Diagnosis not present

## 2014-12-18 DIAGNOSIS — D519 Vitamin B12 deficiency anemia, unspecified: Secondary | ICD-10-CM | POA: Diagnosis not present

## 2014-12-18 DIAGNOSIS — R5383 Other fatigue: Secondary | ICD-10-CM | POA: Diagnosis not present

## 2014-12-21 DIAGNOSIS — I10 Essential (primary) hypertension: Secondary | ICD-10-CM | POA: Diagnosis not present

## 2014-12-22 DIAGNOSIS — I509 Heart failure, unspecified: Secondary | ICD-10-CM | POA: Diagnosis not present

## 2014-12-23 DIAGNOSIS — H25812 Combined forms of age-related cataract, left eye: Secondary | ICD-10-CM | POA: Diagnosis not present

## 2014-12-23 DIAGNOSIS — Z961 Presence of intraocular lens: Secondary | ICD-10-CM | POA: Diagnosis not present

## 2014-12-29 DIAGNOSIS — R531 Weakness: Secondary | ICD-10-CM | POA: Diagnosis not present

## 2015-01-09 DIAGNOSIS — J158 Pneumonia due to other specified bacteria: Secondary | ICD-10-CM | POA: Diagnosis not present

## 2015-01-13 DIAGNOSIS — I482 Chronic atrial fibrillation: Secondary | ICD-10-CM | POA: Diagnosis not present

## 2015-01-13 DIAGNOSIS — I10 Essential (primary) hypertension: Secondary | ICD-10-CM | POA: Diagnosis not present

## 2015-01-13 DIAGNOSIS — I5022 Chronic systolic (congestive) heart failure: Secondary | ICD-10-CM | POA: Diagnosis not present

## 2015-01-14 ENCOUNTER — Ambulatory Visit (INDEPENDENT_AMBULATORY_CARE_PROVIDER_SITE_OTHER): Payer: Commercial Managed Care - HMO | Admitting: *Deleted

## 2015-01-14 DIAGNOSIS — I4891 Unspecified atrial fibrillation: Secondary | ICD-10-CM

## 2015-01-14 DIAGNOSIS — Z9889 Other specified postprocedural states: Secondary | ICD-10-CM

## 2015-01-14 DIAGNOSIS — Z953 Presence of xenogenic heart valve: Secondary | ICD-10-CM | POA: Diagnosis not present

## 2015-01-14 DIAGNOSIS — I5043 Acute on chronic combined systolic (congestive) and diastolic (congestive) heart failure: Secondary | ICD-10-CM

## 2015-01-14 DIAGNOSIS — Z7901 Long term (current) use of anticoagulants: Secondary | ICD-10-CM

## 2015-01-14 DIAGNOSIS — Z8679 Personal history of other diseases of the circulatory system: Secondary | ICD-10-CM

## 2015-01-14 DIAGNOSIS — I5033 Acute on chronic diastolic (congestive) heart failure: Secondary | ICD-10-CM | POA: Diagnosis not present

## 2015-01-14 LAB — POCT INR: INR: 2.1

## 2015-01-15 ENCOUNTER — Encounter: Payer: Self-pay | Admitting: *Deleted

## 2015-01-15 ENCOUNTER — Other Ambulatory Visit: Payer: Self-pay | Admitting: *Deleted

## 2015-01-15 NOTE — Patient Outreach (Signed)
[place] [name]) Care Management   01/15/2015  [name] [name] Jul 28, 1940 [id number]  [name] [name] is an 74 y.o. female  Subjective: Initial home visit with pt, HIPAA verified, pt reports she saw primary MD Monday 01/13/15 and no changes made, MD instructed for pt to call his office every Monday and report her weight.  Pt to see eye doctor today for follow up regarding cataract removal.  Pt states " I've been doing good with keeping my weight down"  "granddaughter still prefills my meds".  Objective:   Filed Vitals:   01/15/15 1154  BP: 118/60  Pulse: 67  Resp: 16  Weight: 158 lb (71.668 kg)  SpO2: 96%   ROS  Physical Exam  Constitutional: She is oriented to person, place, and time. She appears well-developed and well-nourished.  HENT:  Head: Normocephalic.  Neck: Normal range of motion.  Cardiovascular: Normal rate and regular rhythm.   Respiratory: Effort normal and breath sounds normal.  GI: Soft. Bowel sounds are normal.  Musculoskeletal: Normal range of motion. She exhibits edema.  2+ edema lower extremity bil 1+ edema lower extremity bil  Neurological: She is alert and oriented to person, place, and time.  Skin: Skin is warm and dry.  Psychiatric: She has a normal mood and affect. Her behavior is normal. Judgment and thought content normal.    Current Medications:   Current Outpatient Prescriptions  Medication Sig Dispense Refill  . amiodarone (PACERONE) 200 MG tablet Take 0.5 tablets (100 mg total) by mouth daily. 30 tablet 6  . colchicine 0.6 MG tablet Take 0.5 tablets (0.3 mg total) by mouth daily. 30 tablet 0  . cyanocobalamin (,VITAMIN B-12,) 1000 MCG/ML injection Inject 1 mL into the muscle every 30 (thirty) days.  12  . ferrous sulfate 325 (65 FE) MG tablet Take 325 mg by mouth 2 (two) times daily with a meal.    . levothyroxine (SYNTHROID, LEVOTHROID) 50 MCG tablet Take 50 mcg by mouth daily before breakfast.    . metolazone  (ZAROXOLYN) 2.5 MG tablet Take 1 tablet (2.5 mg total) by mouth as directed. TAKE 1 TABLET AS NEEDED ONLY IF YOUR WEIGHT INCREASES 2-3 POUNDS IN 24 HOURS. (Patient taking differently: Take 2.5 mg by mouth daily. Marland Kitchen)    . omeprazole (PRILOSEC) 20 MG capsule Take 1 capsule (20 mg total) by mouth daily. Medicine to help protect your stomach lining from ulcers while on coumadin 30 capsule 3  . potassium chloride SA (K-DUR,KLOR-CON) 20 MEQ tablet Take 1 tablet (20 mEq total) by mouth daily. 60 tablet 6  . simvastatin (ZOCOR) 10 MG tablet TAKE ONE TABLET BY MOUTH EVERY EVENING. 30 tablet 3  . tolterodine (DETROL LA) 4 MG 24 hr capsule Take 4 mg by mouth daily.    Marland Kitchen torsemide (DEMADEX) 100 MG tablet STARTING TOMORROW, TAKE A HALF A TABLET DAILY FOR 3 DAYS AND THEN RESTART ONE TABLET DAILY THEREAFTER. (Patient taking differently: Take 100 mg by mouth every morning. )    . warfarin (COUMADIN) 2.5 MG tablet Take 1.25-2.5 mg by mouth daily at 6 PM. Take 2.5mg  on Mondays, Wednesday, Friday. 1.25 mg on Tuesday,Thursday,Saturday,Sunday     No current facility-administered medications for this visit.    Functional Status:   In your present state of health, do you have any difficulty performing the following activities: 11/15/2014 11/02/2014  Hearing? N N  Vision? N N  Difficulty concentrating or making decisions? N N  Walking or climbing stairs? [name]  Dressing  or bathing? Y N  Doing errands, shopping? [name]  Preparing Food and eating ? N -  Using the Toilet? N -  In the past six months, have you accidently leaked urine? Y -  Do you have problems with loss of bowel control? N -  Managing your Medications? Y -  Managing your Finances? N -  Housekeeping or managing your Housekeeping? Y -    Fall/Depression Screening:    PHQ 2/9 Scores 11/15/2014 08/19/2014 06/07/2014  PHQ - 2 Score 0 0 0    Assessment:  RN CM continues CHF teaching and reinforcement/ action plan, reviewed discharge plan with pt and may transfer  to health coach in December.  RN CM reviewed and observed all medication bottles with pt.    [facility] CM Care Plan Problem One        Most Recent Value   Care Plan Problem One  Knowledge deficity related to CHF disease process   Role Documenting the Problem One  Care Management Coordinator   Care Plan for Problem One  Active   THN Long Term Goal (31-90 days)  pt will have no admissions related to heart failure within 90 days.   THN Long Term Goal Start Date  11/05/14   Interventions for Problem One Long Term Goal  RN CM reitereated with pt importance of calling MD early for change in health status and being mindful of weight gain, edema, dyspnea.   [facility]N CM Short Term Goal #2 (0-30 days)  pt will verbalize signs/ symptoms pneumonia/ exacerbation within 30 days.   [facility]N CM Short Term Goal #2 Start Date  12/12/14   Interventions for Short Term Goal #2  RN CM reviewed signs/ symptoms pneumonia exacerbation    [facility]N CM Care Plan Problem Two        Most Recent Value   Care Plan for Problem Two  Active   [facility]N CM Short Term Goal #1 (0-30 days)  Pt not consistently recording daily weights.   [facility]N CM Short Term Goal #1 Start Date  01/15/15   Interventions for Short Term Goal #2   RN CM ask pt to continue weighing daily and recording (pt has now only been recording for 3 days), reminded pt to call primary MD q Monday and report weight as per MD request      Plan: follow up with home visit on 02/13/15 Assess weight log Continue CHF teaching, reinforcement  [name] [name] [name], [place] Coordinator [phone]

## 2015-01-21 DIAGNOSIS — I10 Essential (primary) hypertension: Secondary | ICD-10-CM | POA: Diagnosis not present

## 2015-01-22 DIAGNOSIS — I509 Heart failure, unspecified: Secondary | ICD-10-CM | POA: Diagnosis not present

## 2015-01-29 DIAGNOSIS — R531 Weakness: Secondary | ICD-10-CM | POA: Diagnosis not present

## 2015-02-10 ENCOUNTER — Ambulatory Visit (INDEPENDENT_AMBULATORY_CARE_PROVIDER_SITE_OTHER): Payer: Commercial Managed Care - HMO | Admitting: Cardiology

## 2015-02-10 ENCOUNTER — Encounter: Payer: Self-pay | Admitting: Cardiology

## 2015-02-10 ENCOUNTER — Ambulatory Visit (INDEPENDENT_AMBULATORY_CARE_PROVIDER_SITE_OTHER): Payer: Commercial Managed Care - HMO | Admitting: *Deleted

## 2015-02-10 VITALS — BP 119/62 | HR 75 | Ht 62.0 in | Wt 158.0 lb

## 2015-02-10 DIAGNOSIS — Z9889 Other specified postprocedural states: Secondary | ICD-10-CM | POA: Diagnosis not present

## 2015-02-10 DIAGNOSIS — Z953 Presence of xenogenic heart valve: Secondary | ICD-10-CM

## 2015-02-10 DIAGNOSIS — I639 Cerebral infarction, unspecified: Secondary | ICD-10-CM

## 2015-02-10 DIAGNOSIS — I5043 Acute on chronic combined systolic (congestive) and diastolic (congestive) heart failure: Secondary | ICD-10-CM | POA: Diagnosis not present

## 2015-02-10 DIAGNOSIS — I5022 Chronic systolic (congestive) heart failure: Secondary | ICD-10-CM | POA: Diagnosis not present

## 2015-02-10 DIAGNOSIS — I4891 Unspecified atrial fibrillation: Secondary | ICD-10-CM

## 2015-02-10 DIAGNOSIS — Z8679 Personal history of other diseases of the circulatory system: Secondary | ICD-10-CM

## 2015-02-10 DIAGNOSIS — I5033 Acute on chronic diastolic (congestive) heart failure: Secondary | ICD-10-CM

## 2015-02-10 DIAGNOSIS — Z7901 Long term (current) use of anticoagulants: Secondary | ICD-10-CM | POA: Diagnosis not present

## 2015-02-10 DIAGNOSIS — Z954 Presence of other heart-valve replacement: Secondary | ICD-10-CM | POA: Diagnosis not present

## 2015-02-10 DIAGNOSIS — Z952 Presence of prosthetic heart valve: Secondary | ICD-10-CM

## 2015-02-10 NOTE — Patient Instructions (Signed)
Your physician wants you to follow-up in: 6 months with Dr. Bryna Colander will receive a reminder letter in the mail two months in advance. If you don't receive a letter, please call our office to schedule the follow-up appointment.  Your physician recommends that you continue on your current medications as directed. Please refer to the Current Medication list given to you today.  Your physician recommends that you return for lab work CMP/MG  Thank you for choosing [facility]!!

## 2015-02-10 NOTE — Progress Notes (Signed)
[name], female   DOB: 1940/11/04, 74 y.o.   MRN: [id number]     Clinical Summary Ms. Deutschman is a 74 y.o.female seen today for follow up of the following medical problems.   1. Mitral regurgitation  - hx of rheumatic fever, developed severe MR  - MVR (32mm Edwards Magna Mitral bovine bioprosthetic tissue valve) 05/16/2013 with combined MAZE procedure  - echo 06/05/13 with LVEF 40-45%, normal functioning MVR  - echo 05/2014 LVEF 50-55%, normal functioning MVR, mild AS, mod MR  - denies any SOB or DOE. No LE edema     2. Combined Systolic/ diastolic heart failure  - echo last year showed mildly LV systolic dysfunction, repeat 05/2014 shows low normal function at 50-55%. Abnormal diastolic function, indeterminate grade.  - she is not on ACE-I due to CKD   - compliant with meds. Limiting sodium intake. Weights stable at 154 lbs     3. Paroxysmal afib  - MAZE procedure during MVR 05/16/2013  - after Maze procedure episode of aflutter, underwent DCCV 06/06/13 for aflutter, remains on amio and coumadin.  - . Has had some troubles with nosebleeds requiring cautery by ENT in the past. Most recently see in ER 06/2014 with nose bleed, INR was 1.92 at that time.   - denies any palpitations - denies any bleeding issues on anticoag  4. CKD Stage IV  Past Medical History  Diagnosis Date  . COPD (chronic obstructive pulmonary disease) ([place])   . History of TIA (transient ischemic attack)     Diagnosed 2003  . History of stroke     2000 [place]  . Obesity   . Juvenile rheumatic fever     Age 8  . Essential hypertension, benign   . Hyperlipidemia   . Mitral regurgitation     Status post bioprosthetic MVR (05/2013)  . CKD (chronic kidney disease) stage 4, GFR 15-29 ml/min (HCC)   . Paroxysmal atrial fibrillation (HCC)     s/p MAZE (05/2013)  . Chronic diastolic congestive heart failure ([place])     a) ECHO (09/2013) EF 50-55%  . Arthritis   . Aortic  insufficiency due to bicuspid aortic valve     Moderate by TEE  . 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 S/P Maze operation for atrial fibrillation 05/16/2013    Complete bilateral atrial lesion set using cryothermy and bipolar radiofrequency ablation with oversewing of LA appendage  . NICM (nonischemic cardiomyopathy) ([place])     a) LHC (04/2013) no significant CAD     Allergies  Allergen Reactions  . Indomethacin Other (See Comments)    dizziness  . Norvasc [Amlodipine Besylate] Cough     Current Outpatient Prescriptions  Medication Sig Dispense Refill  . amiodarone (PACERONE) 200 MG tablet Take 0.5 tablets (100 mg total) by mouth daily. 30 tablet 6  . colchicine 0.6 MG tablet Take 0.5 tablets (0.3 mg total) by mouth daily. 30 tablet 0  . cyanocobalamin (,VITAMIN B-12,) 1000 MCG/ML injection Inject 1 mL into the muscle every 30 (thirty) days.  12  . ferrous sulfate 325 (65 FE) MG tablet Take 325 mg by mouth 2 (two) times daily with a meal.    . levothyroxine (SYNTHROID, LEVOTHROID) 50 MCG tablet Take 50 mcg by mouth daily before breakfast.    . metolazone (ZAROXOLYN) 2.5 MG tablet Take 1  tablet (2.5 mg total) by mouth as directed. TAKE 1 TABLET AS NEEDED ONLY IF YOUR WEIGHT INCREASES 2-3 POUNDS IN 24 HOURS. (Patient taking differently: Take 2.5 mg by mouth daily. Marland Kitchen)    . omeprazole (PRILOSEC) 20 MG capsule Take 1 capsule (20 mg total) by mouth daily. Medicine to help protect your stomach lining from ulcers while on coumadin 30 capsule 3  . potassium chloride SA (K-DUR,KLOR-CON) 20 MEQ tablet Take 1 tablet (20 mEq total) by mouth daily. 60 tablet 6  . simvastatin (ZOCOR) 10 MG tablet TAKE ONE TABLET BY MOUTH EVERY EVENING. 30 tablet 3  . tolterodine (DETROL LA) 4 MG 24 hr capsule Take 4 mg by mouth daily.    Marland Kitchen torsemide (DEMADEX)  100 MG tablet STARTING TOMORROW, TAKE A HALF A TABLET DAILY FOR 3 DAYS AND THEN RESTART ONE TABLET DAILY THEREAFTER. (Patient taking differently: Take 100 mg by mouth every morning. )    . warfarin (COUMADIN) 2.5 MG tablet Take 1.25-2.5 mg by mouth daily at 6 PM. Take 2.5mg  on Mondays, Wednesday, Friday. 1.25 mg on Tuesday,Thursday,Saturday,Sunday     No current facility-administered medications for this visit.     Past Surgical History  Procedure Laterality Date  . Total knee arthroplasty Right   . Abdominal hysterectomy      Cervical Cancer  . Parathyroid/thyroid surgery      Tumor  . Tee without cardioversion N/A 04/06/2013    Procedure: TRANSESOPHAGEAL ECHOCARDIOGRAM (TEE);  Surgeon: [name], MD;  [facility];  Service: Cardiology;  Laterality: N/A;  . Minimally invasive maze procedure N/A 05/16/2013    Procedure: MINIMALLY INVASIVE MAZE PROCEDURE;  Surgeon: Rexene Alberts, MD;  Location: [place];  Service: Open Heart Surgery;  Laterality: N/A;  . Intraoperative transesophageal echocardiogram N/A 05/16/2013    Procedure: INTRAOPERATIVE TRANSESOPHAGEAL ECHOCARDIOGRAM;  Surgeon: Rexene Alberts, MD;  Location: [place];  Service: Open Heart Surgery;  Laterality: N/A;  . Mitral valve replacement Right 05/16/2013    Procedure: MINIMALLY INVASIVE MITRAL VALVE (MV) REPLACEMENT;  Surgeon: Rexene Alberts, MD;  Location: [place] [facility];  Service: Open Heart Surgery;  Laterality: Right;  . Tee without cardioversion N/A 06/06/2013    Procedure: TRANSESOPHAGEAL ECHOCARDIOGRAM (TEE);  Surgeon: Larey Dresser, MD;  Location: [place];  Service: Cardiovascular;  Laterality: N/A;  . Cardioversion N/A 06/06/2013    Procedure: CARDIOVERSION;  Surgeon: Larey Dresser, MD;  Location: [facility] ENDOSCOPY;  Service: Cardiovascular;  Laterality: N/A;  . Left and right heart catheterization with coronary angiogram N/A 05/04/2013    Procedure: LEFT AND RIGHT HEART CATHETERIZATION WITH CORONARY ANGIOGRAM;   Surgeon: Jettie Booze, MD;  Location: [facility] CATH LAB;  Service: Cardiovascular;  Laterality: N/A;     Allergies  Allergen Reactions  . Indomethacin Other (See Comments)    dizziness  . Norvasc [Amlodipine Besylate] Cough      Family History  Problem Relation Age of Onset  . Heart failure Father   . Heart attack Brother      Social History Ms. Friley reports that she has never smoked. She has never used smokeless tobacco. Ms. Mileto reports that she does not drink alcohol.   Review of Systems CONSTITUTIONAL: No weight loss, fever, chills, weakness or fatigue.  HEENT: Eyes: No visual loss, blurred vision, double vision or yellow sclerae.No hearing loss, sneezing, congestion, runny nose or sore throat.  SKIN: No rash or itching.  CARDIOVASCULAR: per hpi RESPIRATORY: No shortness of breath, cough or sputum.  GASTROINTESTINAL: No anorexia, nausea, vomiting or diarrhea. No abdominal pain or blood.  GENITOURINARY: No burning on urination, no polyuria NEUROLOGICAL: No headache, dizziness, syncope, paralysis, ataxia, numbness or tingling in the extremities. No change in bowel or bladder control.  MUSCULOSKELETAL: No muscle, back pain, joint pain or stiffness.  LYMPHATICS: No enlarged nodes. No history of splenectomy.  PSYCHIATRIC: No history of depression or anxiety.  ENDOCRINOLOGIC: No reports of sweating, cold or heat intolerance. No polyuria or polydipsia.  Marland Kitchen   Physical Examination Filed Vitals:   02/10/15 1330  BP: 119/62  Pulse: 75   Filed Vitals:   02/10/15 1330  Height: 5\' 2"  (1.575 m)  Weight: 158 lb (71.668 kg)    Gen: resting comfortably, no acute distress HEENT: no scleral icterus, pupils equal round and reactive, no palptable cervical adenopathy,  CV: RRR, 2/6 diastolic murmur RSUB, no jvd Resp: Clear to auscultation bilaterally GI: abdomen is soft, non-tender, non-distended, normal bowel sounds, no hepatosplenomegaly MSK: extremities are warm, no  edema.  Skin: warm, no rash Neuro:  no focal deficits Psych: appropriate affect   Diagnostic Studies 06/05/13 Echo  Procedure narrative: Transthoracic echocardiography. Poor endocardial definition. Intravenous contrast (Definity) was administered. - Left ventricle: The cavity size was normal. Wall thickness was normal. Systolic function was mildly to moderately reduced. The estimated ejection fraction was in the range of 40% to 45%. LV diastolic function cannot be assessed due to the prosthetic mitral valve. - Aortic valve: Poorly visualized. Mildly calcified leaflets. Transvalvular velocity was minimally increased. Mild regurgitation. - Mitral valve: Bioprosthetic mitral valve. Leaflets not well-visualized. Appears to be stable. Peak and mean gradients of 14 and 6 mmHg across the valve. Trivial regurgitation. Valve area by continuity equation (using LVOT flow): 1.33cm^2. - Right ventricle: The cavity size was mildly dilated. Systolic function is mildly reduced. - Right atrium: The atrium was normal in size. - Tricuspid valve: Mild regurgitation. - Pulmonary arteries: PA peak pressure: 43mm Hg (S). - Inferior vena cava: The vessel was dilated; the respirophasic diameter changes were blunted (< 50%); findings are consistent with elevated central venous pressure. - Pericardium, extracardiac: There was no pericardial effusion.  06/06/13 TEE  Study Conclusions  - Left ventricle: The cavity size was normal. Wall thickness was increased in a pattern of mild LVH. The estimated ejection fraction was 45%. Diffuse hypokinesis. - Aortic valve: Bicuspid. There was no stenosis. Mild to moderate regurgitation. - Mitral valve: Bioprosthetic mitral valve with trivial regurgitation and no significant stenosis. Pressure half-time: 67ms. - Left atrium: Ligated at prior surgery but there was still some flow into the appendage. No thrombus noted in appendage. The atrium was mildly  dilated. - Right ventricle: The cavity size was normal. Systolic function was mildly reduced. - Right atrium: No evidence of thrombus in the atrial cavity or appendage. - Tricuspid valve: Moderate regurgitation. Peak RV-RA gradient: 49mm Hg (S). Impressions:  - May proceed to [place].   05/2014 echo Study Conclusions  - Left ventricle: The cavity size was normal. There was mild concentric hypertrophy. Systolic function was normal. The estimated ejection fraction was in the range of 50% to 55%. Images were inadequate for LV wall motion assessment. Diastolic dysfunction, indeterminate grade. - Aortic valve: Mildly calcified annulus. Trileaflet; mildly thickened, mildly calcified leaflets. Mild aortic stenosis. Peak velocity 2.28 m/s. Mean gradient 12 mmHg. There was moderate regurgitation. - Mitral valve: Normally functioning bioprosthetic mitral valve. There was no significant regurgitation. - Left atrium: The atrium was moderately dilated. Volume/bsa, S: 38.2 ml/m^2. -  Right ventricle: Systolic function was mildly reduced. - Right atrium: The atrium was mildly dilated. - Tricuspid valve: There was mild-moderate regurgitation. - Pulmonary arteries: PA peak pressure: 38 mm Hg (S). Mildly elevated pulmonary pressures.   Assessment and Plan   1. Mitral regurigation - s/p MVR with tissue valve. - no current symptoms. Continue current therapy.   2. Chronic systolic/diastolic heart failure - medical therapy limited due to low blood pressures, not on beta blocker. No ACE due to severe CKD - appears euvolemic, weights stable - continue current meds  3. Afib - no current symptoms.  - Continue amio and coumadin.   F/u 6 months   [name], M.D.

## 2015-02-13 ENCOUNTER — Other Ambulatory Visit: Payer: Self-pay | Admitting: *Deleted

## 2015-02-13 ENCOUNTER — Ambulatory Visit: Payer: Commercial Managed Care - HMO | Admitting: *Deleted

## 2015-02-13 NOTE — Patient Outreach (Signed)
02/13/15- Telephone call to remind pt of scheduled home visit, spoke with daughter [name] who reports "she's with me today, we're at our church and going around doing errands, she won't be home"  Daughter ask that RN CM call back at later date to reschedule home visit.  [name] [facility], [place] Coordinator [phone]

## 2015-02-17 ENCOUNTER — Other Ambulatory Visit (HOSPITAL_COMMUNITY): Payer: Self-pay | Admitting: Cardiology

## 2015-02-17 ENCOUNTER — Other Ambulatory Visit: Payer: Self-pay | Admitting: *Deleted

## 2015-02-17 NOTE — Patient Outreach (Signed)
02/17/15-Telephone call to patient to reschedule home visit, spoke with pt, HIPAA verified, pt reports she is doing well at present, continues weighing daily, 153.2 pounds today, has all medications, taking as prescribed, no changes, concerns or issues reported.  Pt states due to holiday season she is very busy, RN CM scheduled home visit to accomodate patient's schedule on 03/19/15.  RN CM reminded pt of CHF action plan and being careful of sodium intake during holiday season.  PLAN See pt for home visit 03/19/15  [name] [facility], [place] Coordinator [phone]

## 2015-02-18 DIAGNOSIS — I5022 Chronic systolic (congestive) heart failure: Secondary | ICD-10-CM | POA: Diagnosis not present

## 2015-02-18 LAB — POCT INR
INR: 2.2
INR: 2.2

## 2015-02-20 DIAGNOSIS — I10 Essential (primary) hypertension: Secondary | ICD-10-CM | POA: Diagnosis not present

## 2015-02-21 DIAGNOSIS — I509 Heart failure, unspecified: Secondary | ICD-10-CM | POA: Diagnosis not present

## 2015-02-28 DIAGNOSIS — R531 Weakness: Secondary | ICD-10-CM | POA: Diagnosis not present

## 2015-03-06 ENCOUNTER — Encounter: Payer: Self-pay | Admitting: *Deleted

## 2015-03-10 IMAGING — CR DG CHEST 1V PORT
1 series · 1 of 1 positions shown · non-contrast
Comparison: May 17, 2013.

CLINICAL DATA: Status post intubation.

EXAM:
PORTABLE CHEST - 1 VIEW

[AP]
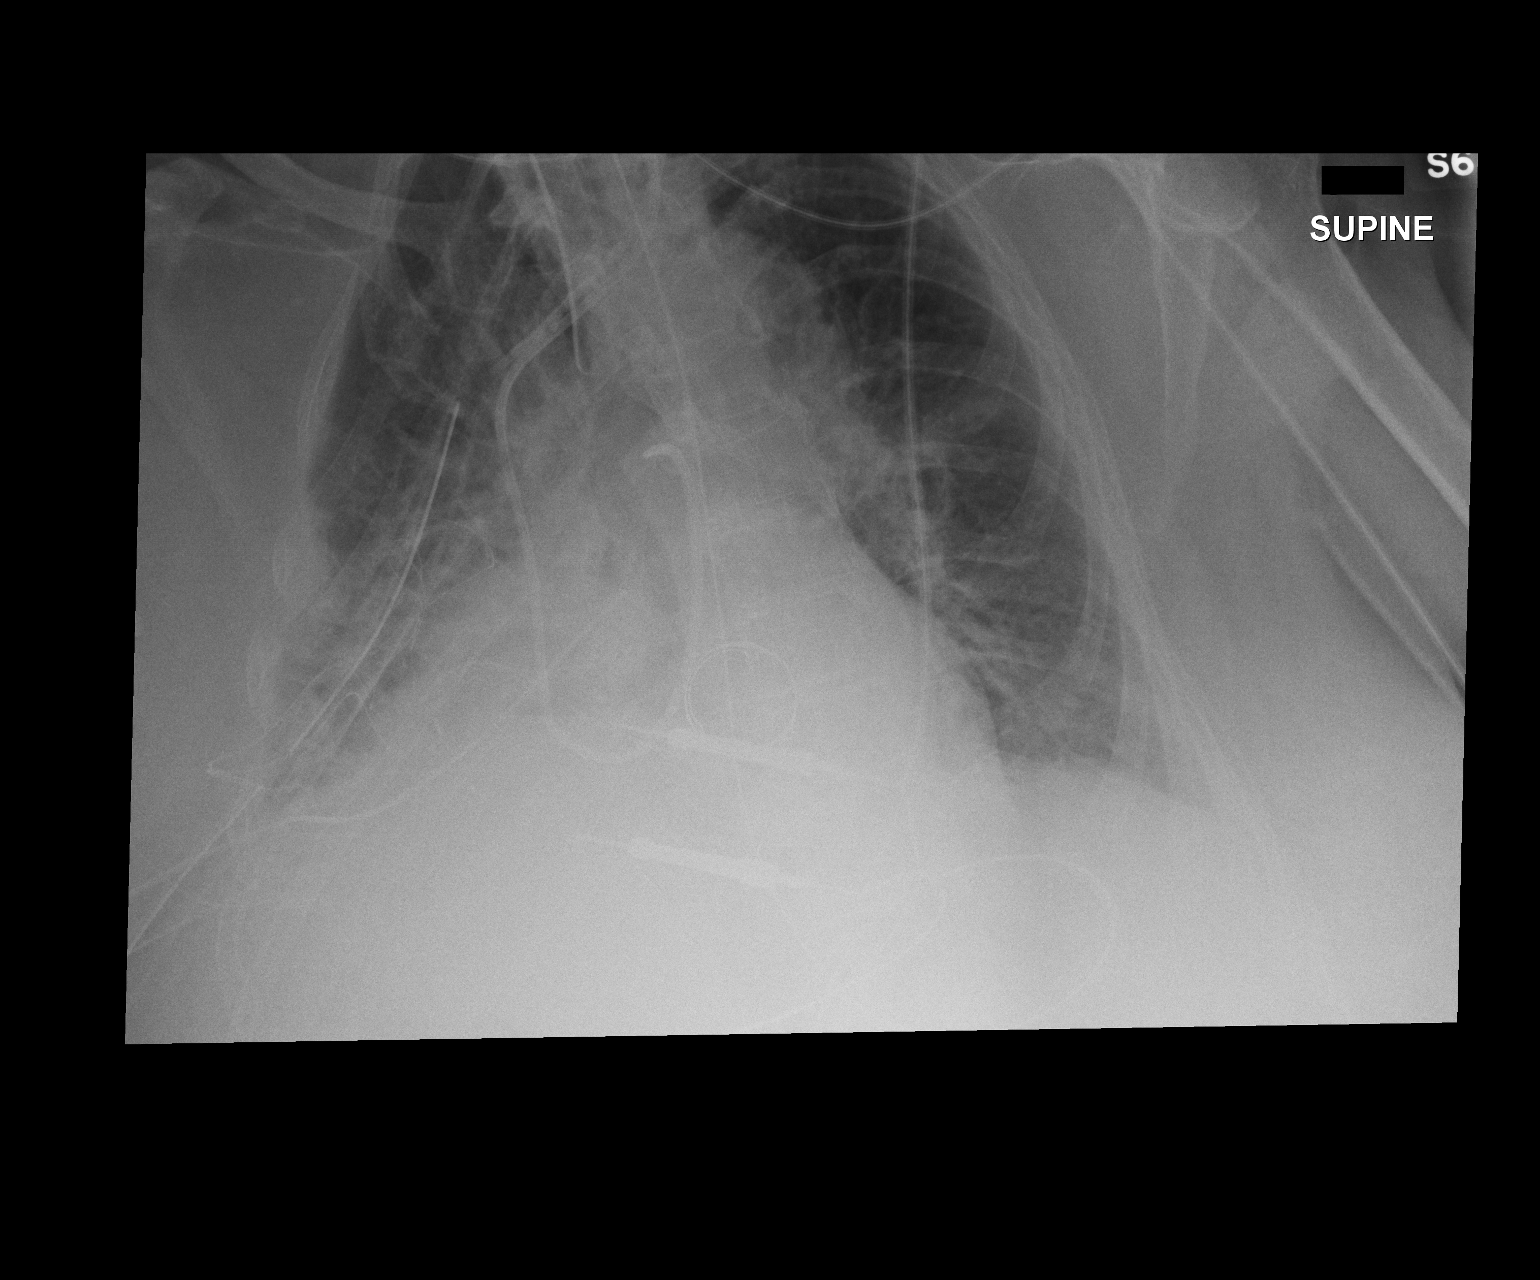

[1 of 1 positions shown; findings below may reference images not displayed]

FINDINGS: Endotracheal tube is in grossly good position with distal tip 2 cm
above the carina. Nasogastric tube passes through the esophagus and
into the stomach. At least 2 right-sided chest tubes are noted
without evidence of pneumothorax. Right base opacity is slightly
improved consistent with atelectasis, pneumonia or effusion. Left
lung is clear. Left internal jugular Swan-Ganz catheter is noted
with tip directed toward right pulmonary artery. Another left
internal jugular catheter is noted with distal tip at the SVC.
IMPRESSION: Endotracheal tube in grossly good position. Right basilar opacity is
slightly improved compared to prior exam. No pneumothorax is seen.

## 2015-03-10 IMAGING — CR DG CHEST 1V PORT
1 series · 1 of 1 positions shown · non-contrast
Comparison: none

[AP]
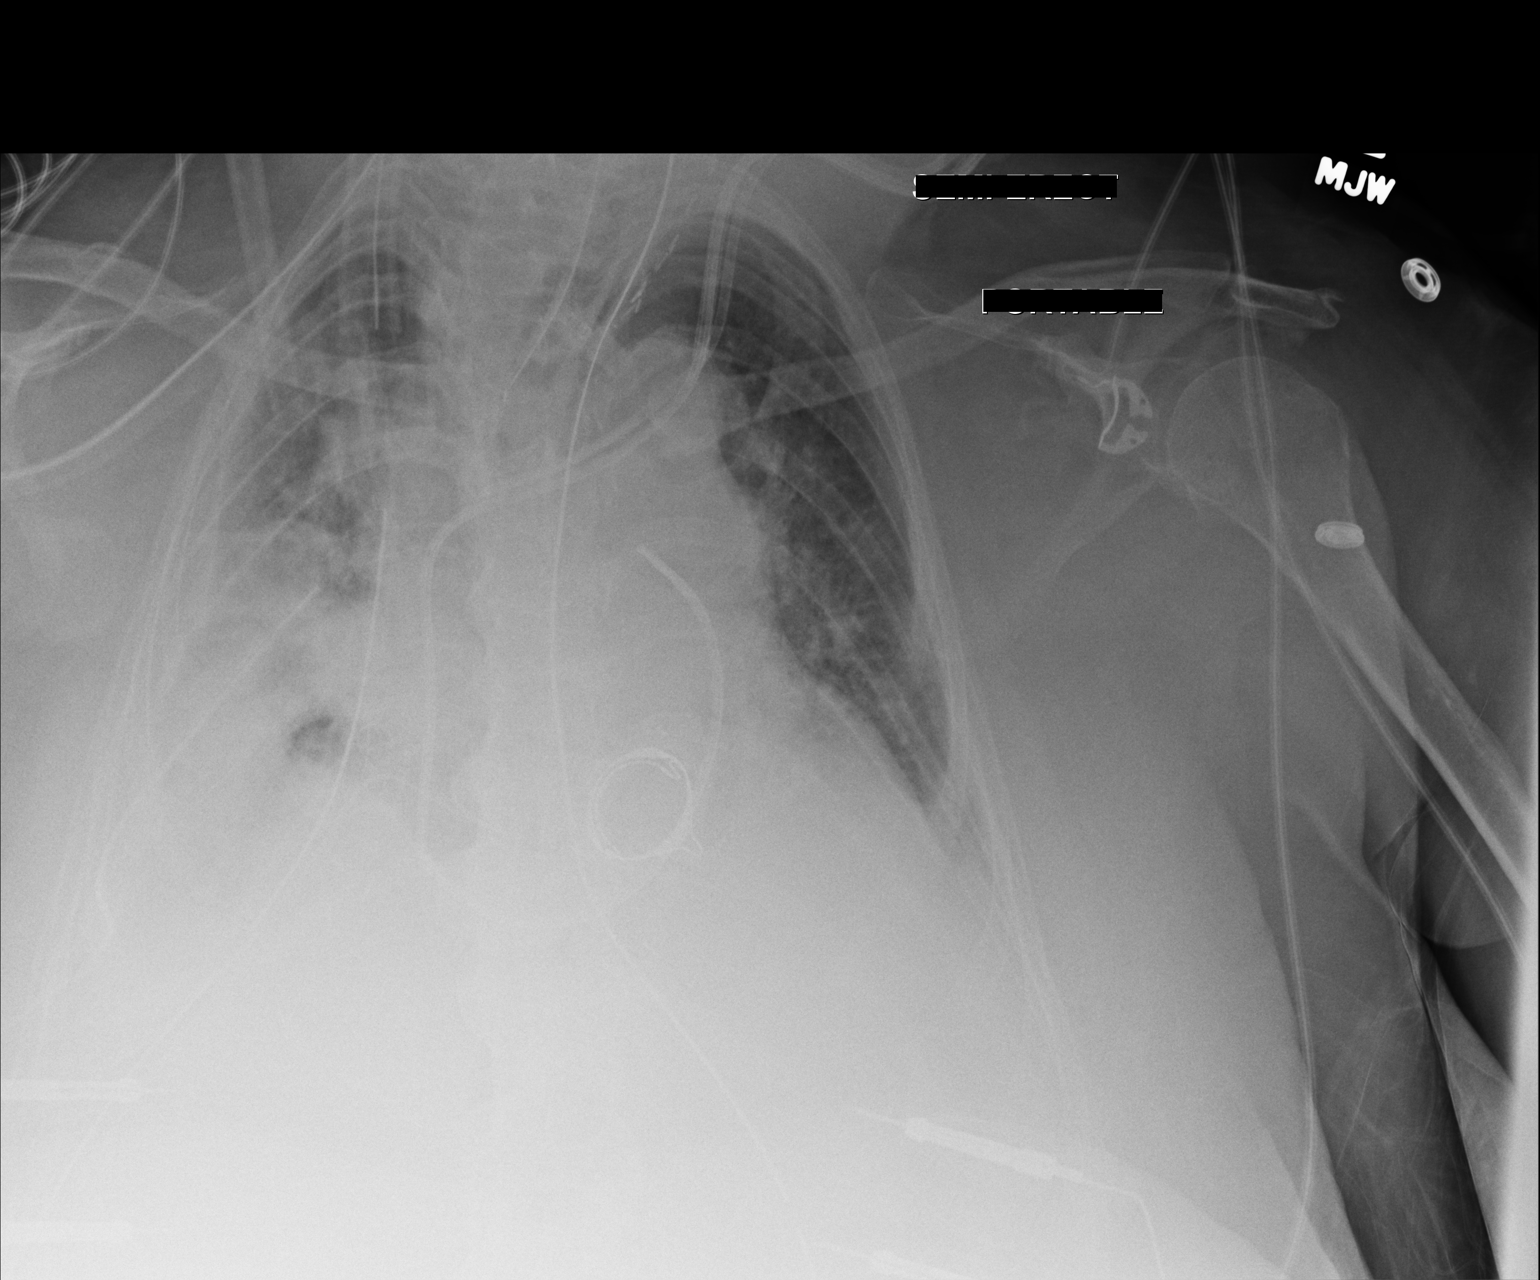

[1 of 1 positions shown; findings below may reference images not displayed]

CLINICAL DATA
Mitral valve disorder, postop film

EXAM
PORTABLE CHEST - 1 VIEW

COMPARISON
DG CHEST 1V PORT dated 05/16/2013; DG CHEST 2 VIEW dated 05/13/2013; CT
ANGIO CHEST AORTA W/CM & WO/CM dated 05/07/2013

FINDINGS
Support devices in unchanged position. Three right-sided chest tubes
now present. Mitral valve replacement noted. Significant cardiac
enlargement stable. Extensive pleural-parenchymal opacity right mid
to lower lung zone, unchanged. Increased left lower lobe
consolidation. Stable mild interstitial pulmonary edema.

IMPRESSION
Severe stable pleural parenchymal opacity right mid to lower lung
zone. Increase severity of left lower lobe consolidation and
possible left effusion.

SIGNATURE

## 2015-03-11 IMAGING — CR DG CHEST 1V PORT
1 series · 1 of 1 positions shown · non-contrast
Comparison: 05/17/2013

CLINICAL DATA: Followup atelectasis.  CHF.

EXAM:
PORTABLE CHEST - 1 VIEW

[AP]
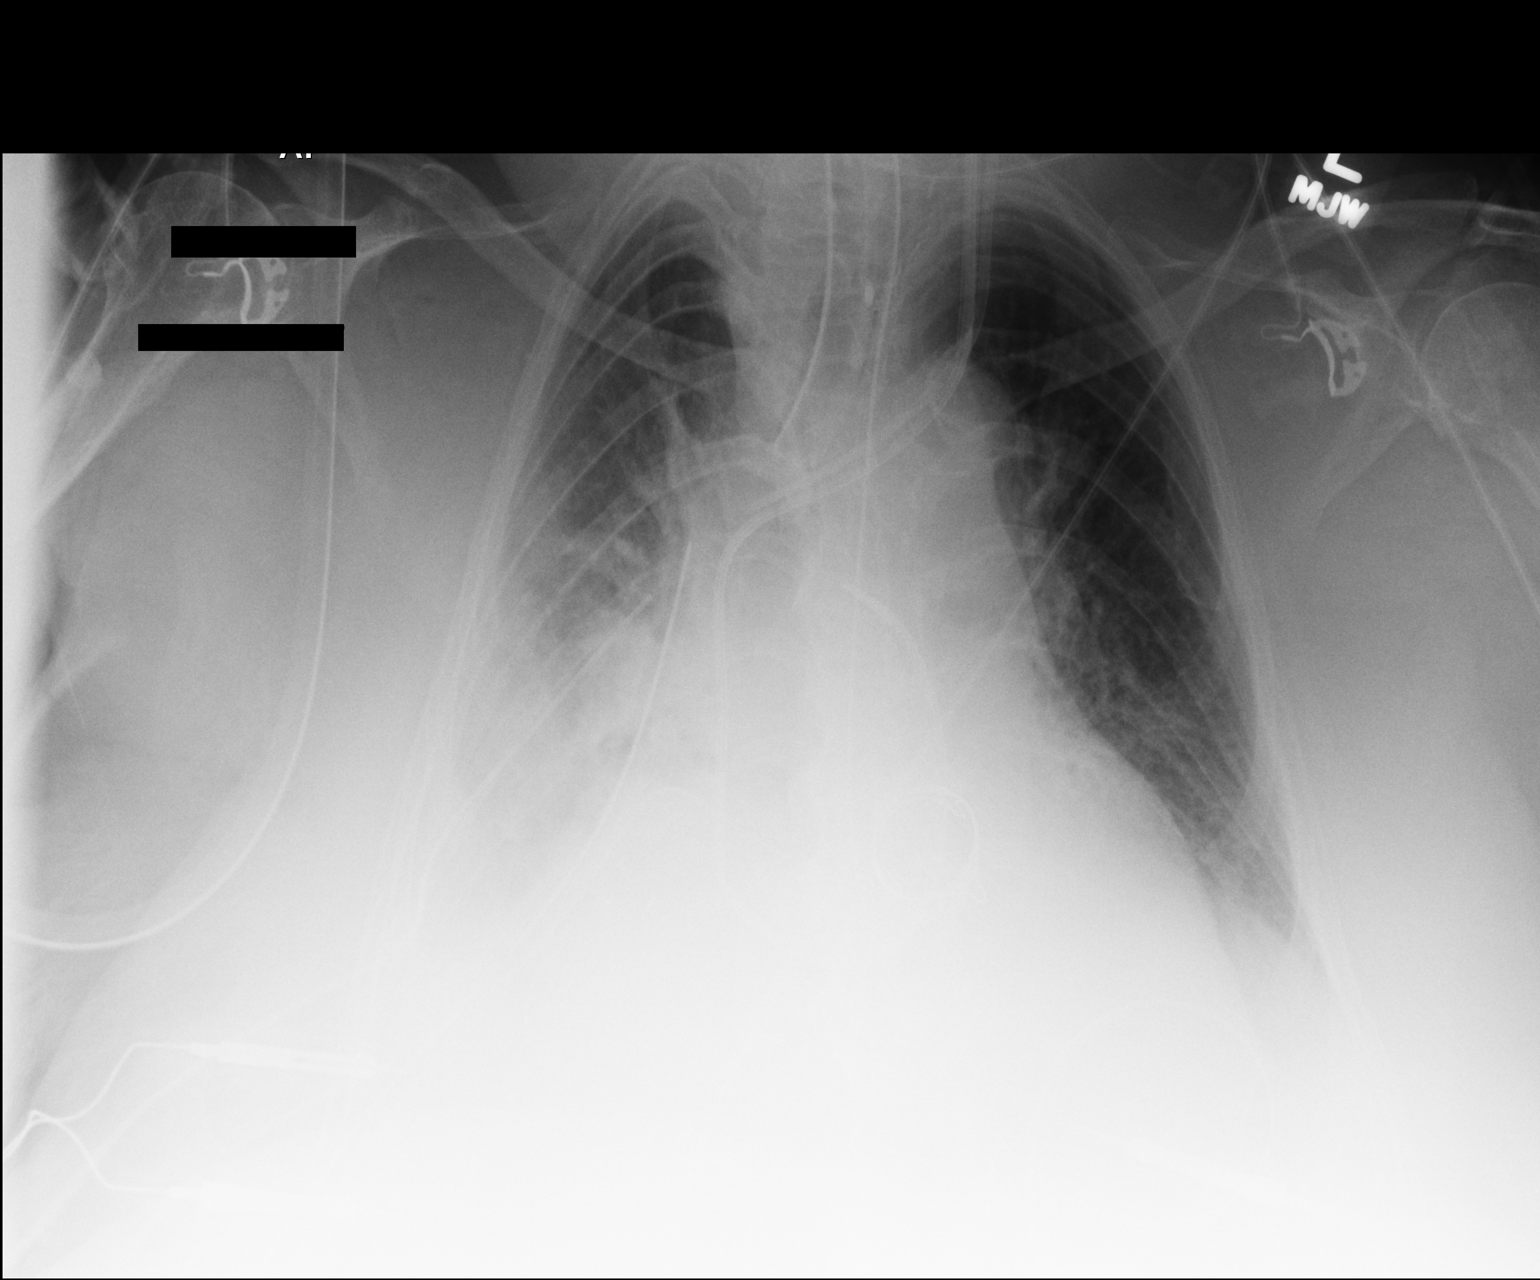

[1 of 1 positions shown; findings below may reference images not displayed]

FINDINGS: Support devices are unchanged. Patchy right lung and left basilar
airspace opacities are again noted, not significantly changed.
Suspect small right effusion. Mild cardiomegaly. No acute bony
abnormality.
IMPRESSION: No significant change since prior study.

## 2015-03-12 IMAGING — CR DG CHEST 1V PORT
2 series · 2 of 2 positions shown · non-contrast
Comparison: DG CHEST 1V PORT dated 05/18/2013;

CLINICAL DATA: ETT placement

EXAM:
PORTABLE CHEST - 1 VIEW

[AP (1 of 2)]
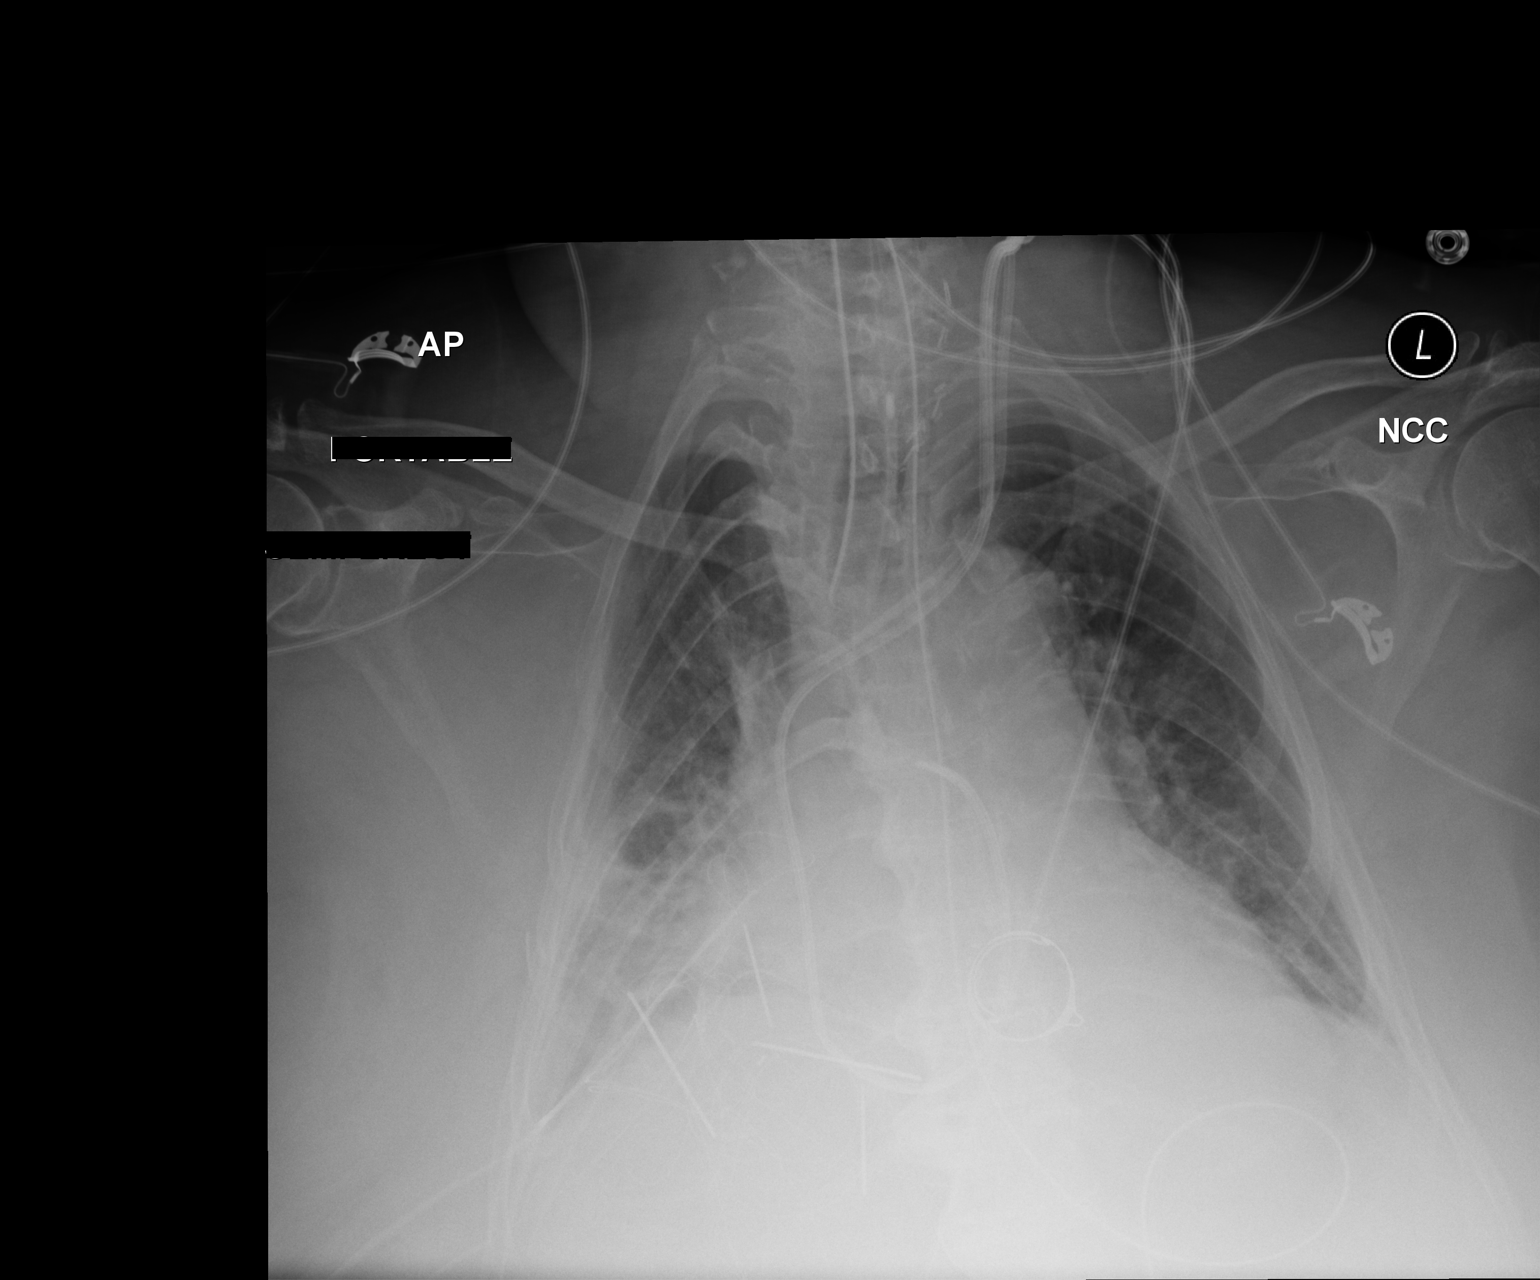

[AP (2 of 2)]
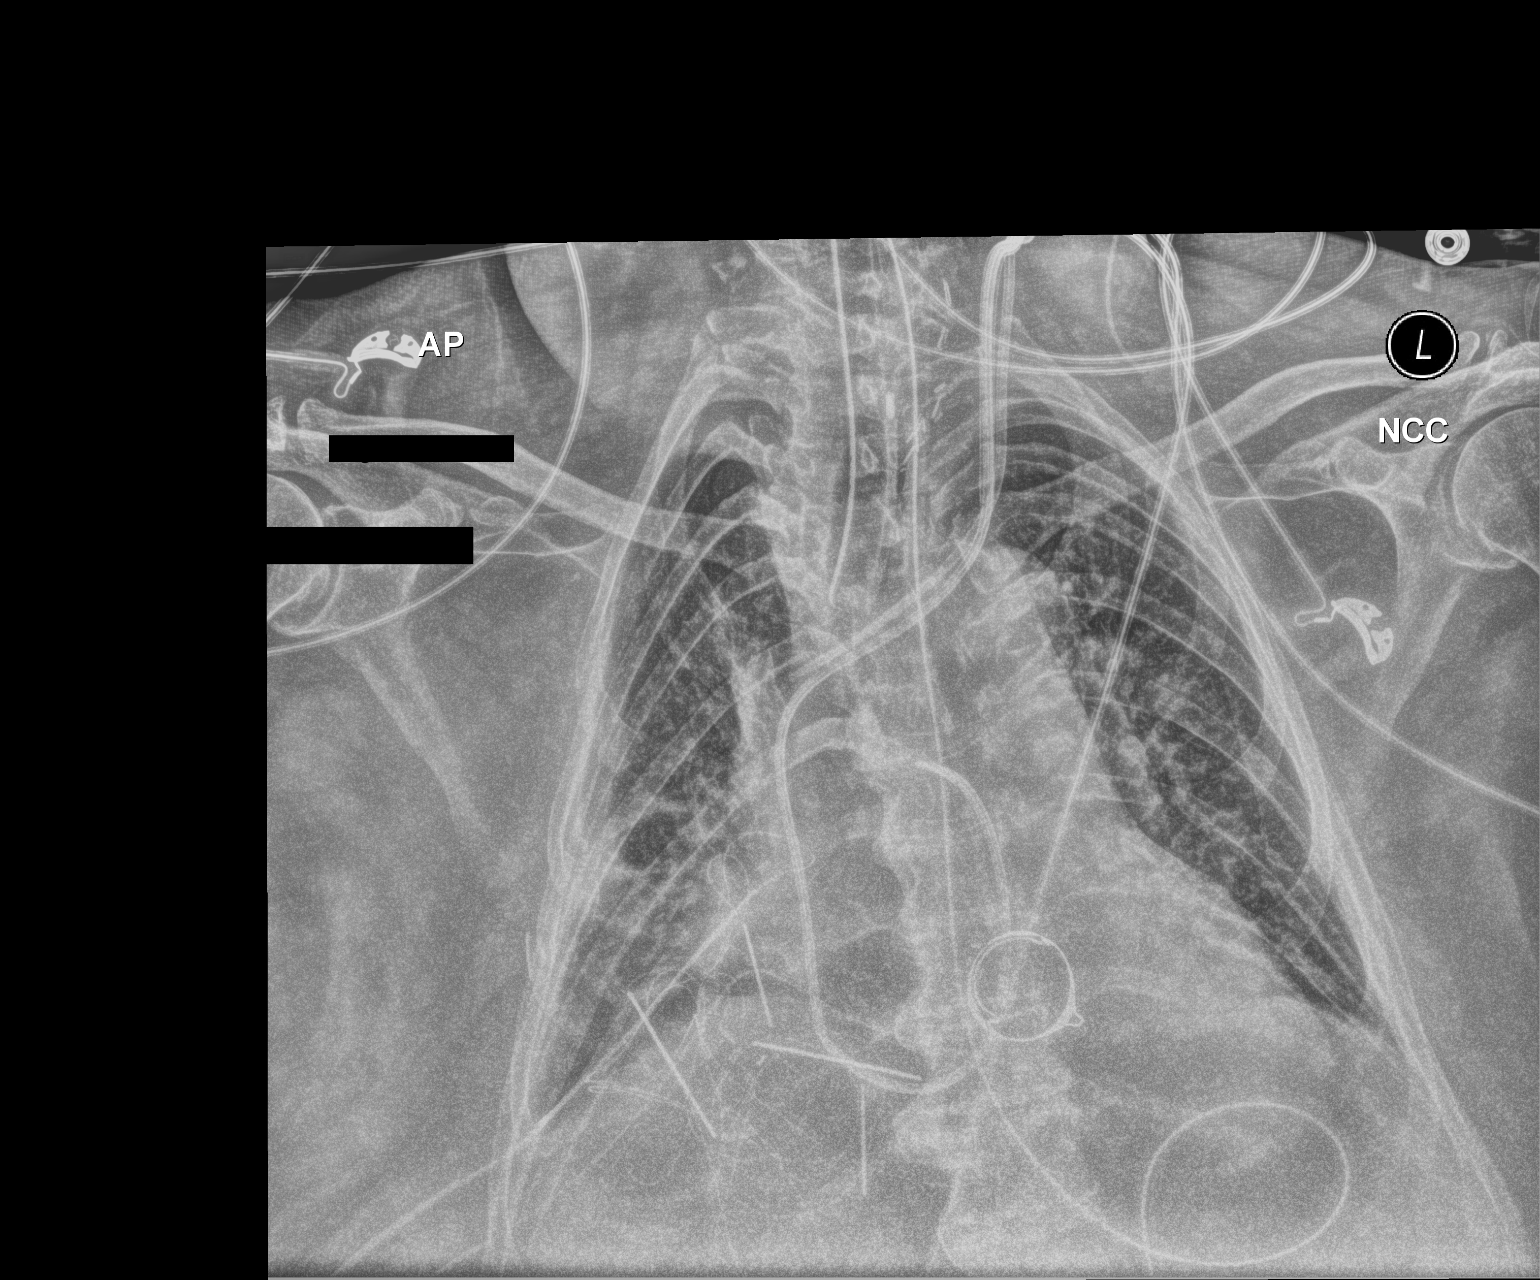

[2 of 2 positions shown; findings below may reference images not displayed]

CT ANGIO CHEST AORTA
W/CM & WO/CM dated 05/07/2013; DG CHEST 1V PORT dated 05/17/2013
FINDINGS: Endotracheal tube ends in the lower thoracic trachea. Left IJ
approach catheters in unchanged position. The Swan-Ganz catheter is
still directed towards the right main pulmonary artery. Two
right-sided thoracic drains, with the upper tube shorter today.
Unchanged Heart size and mediastinal contours. Lung volumes remain
low with right more than left base opacities. No evidence of
pneumothorax. Lateral right rib fracture, likely 6.
IMPRESSION: 1. The upper right thoracic drain is mildly retracted. Other tubes
and lines in unchanged position.
2. Unchanged postoperative opacification of the lower lungs.
3. Lateral right rib fracture, likely the 6th.

## 2015-03-13 IMAGING — CR DG CHEST 1V PORT
1 series · 1 of 1 positions shown · non-contrast
Comparison: 05/19/2013

CLINICAL DATA: Previous cardiac surgery

EXAM:
PORTABLE CHEST - 1 VIEW

[AP]
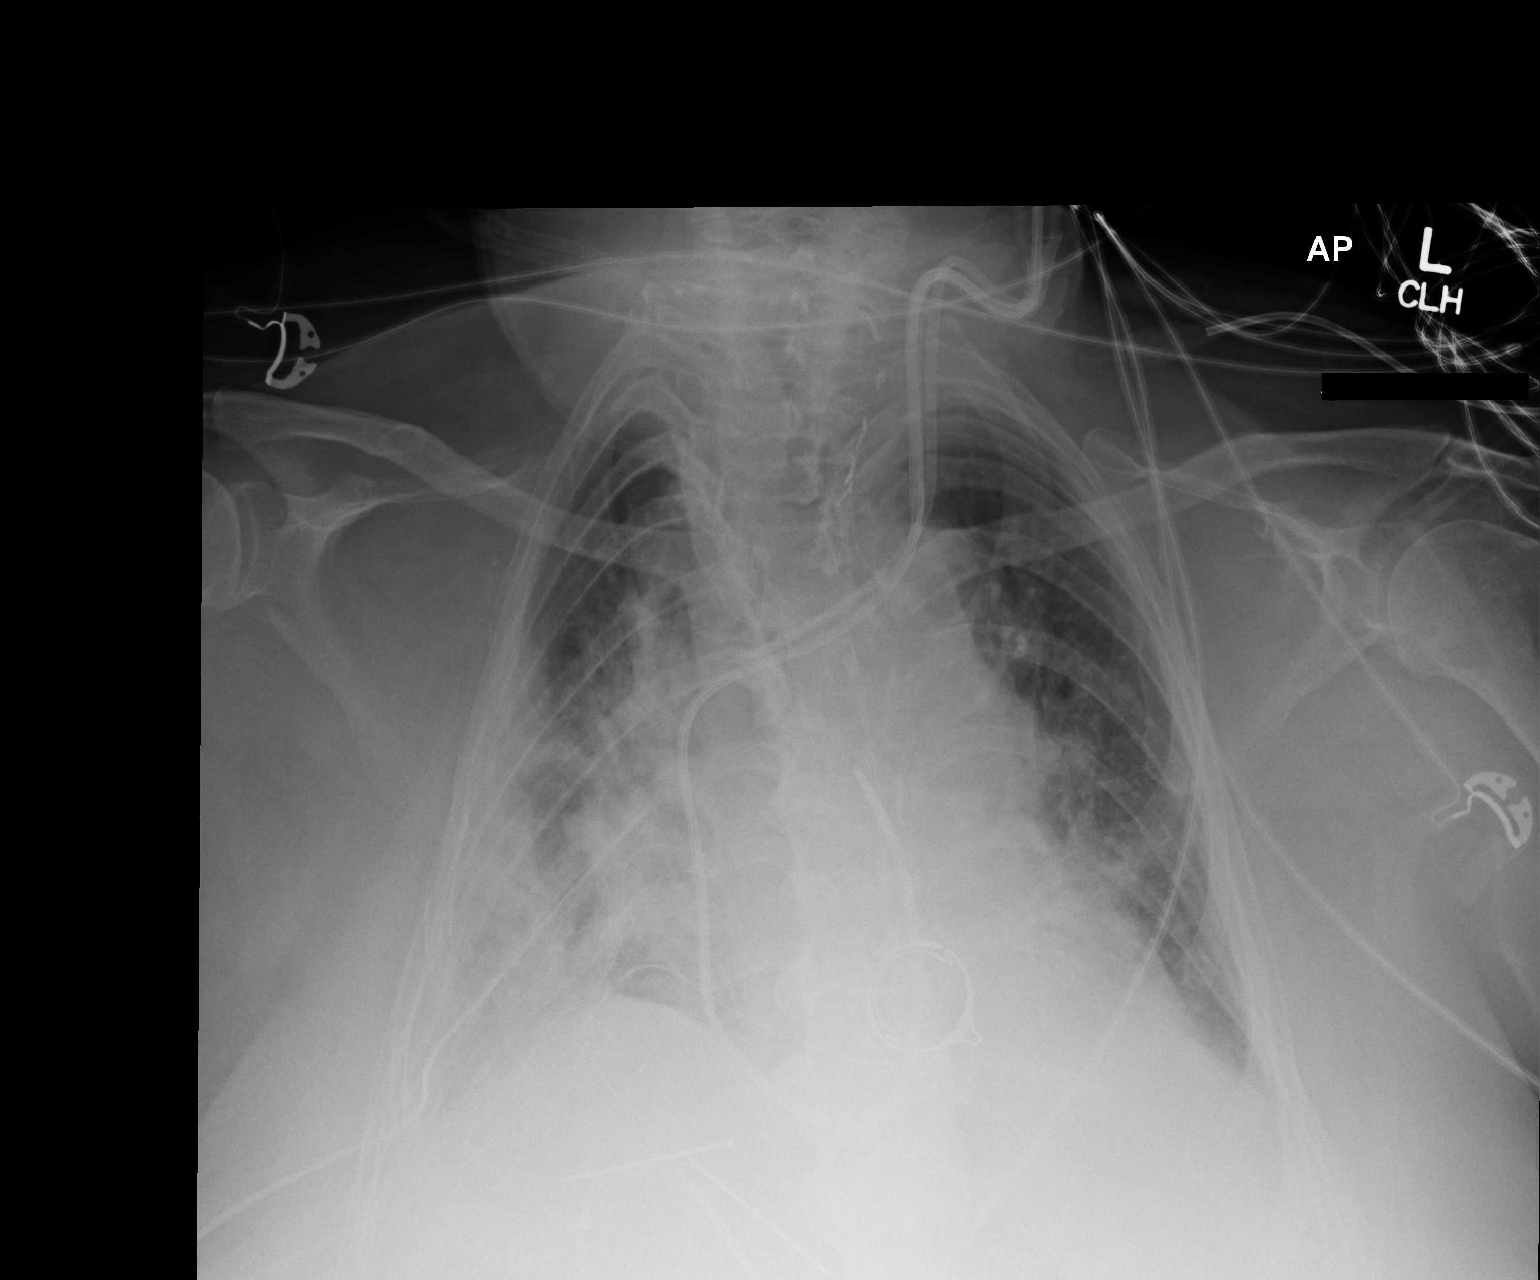

[1 of 1 positions shown; findings below may reference images not displayed]

FINDINGS: Cardiac shadow is stable. A Swan-Ganz catheter is again noted in the
pulmonary outflow tract. The second left jugular line is noted. The
endotracheal tube and nasogastric catheter been removed in the
interval. Thoracostomy catheters remain on the right. Persistent
right basilar density is seen. No pneumothorax is noted. The
previously seen right rib fractures are less well visualized on the
current study.
IMPRESSION: Stable changes in the right lung base.

## 2015-03-19 ENCOUNTER — Encounter: Payer: Self-pay | Admitting: *Deleted

## 2015-03-19 ENCOUNTER — Other Ambulatory Visit: Payer: Self-pay | Admitting: *Deleted

## 2015-03-19 NOTE — Patient Outreach (Signed)
[facility]) Care Management   03/19/2015  [name] [name] 04-27-1940 [id number]  [name] [name] is an 75 y.o., pt female  Subjective: Routine home visit with pt, HIPAA verified, pt reports she is doing well and states " I gained maybe 3-4 pounds over the holidays so I did pretty well"  Pt states she is weighing daily and recording and her daughter and granddaughter assist with medication oversight, transportation to appointments and all aspects of care, no new problems or concerns reported.  Objective:   Filed Vitals:   03/19/15 1420  BP: 132/64  Pulse: 62  Resp: 16  Weight: 157 lb (71.215 kg)  SpO2: 97%   ROS  Physical Exam  Constitutional: She is oriented to person, place, and time. She appears well-developed and well-nourished.  HENT:  Head: Normocephalic.  Neck: Normal range of motion. Neck supple.  Cardiovascular: Normal rate and regular rhythm.   Respiratory: Effort normal and breath sounds normal.  GI: Soft. Bowel sounds are normal.  Musculoskeletal: Normal range of motion. She exhibits edema.  2+ left lower extremity 1+ right lower extremity  Neurological: She is alert and oriented to person, place, and time.  Skin: Skin is warm and dry.  Psychiatric: She has a normal mood and affect. Her behavior is normal. Judgment and thought content normal.    Current Medications:   Current Outpatient Prescriptions  Medication Sig Dispense Refill  . amiodarone (PACERONE) 200 MG tablet Take 0.5 tablets (100 mg total) by mouth daily. 30 tablet 6  . colchicine 0.6 MG tablet Take 0.5 tablets (0.3 mg total) by mouth daily. 30 tablet 0  . cyanocobalamin (,VITAMIN B-12,) 1000 MCG/ML injection Inject 1 mL into the muscle every 30 (thirty) days.  12  . ferrous sulfate 325 (65 FE) MG tablet Take 325 mg by mouth 2 (two) times daily with a meal.    . levothyroxine (SYNTHROID, LEVOTHROID) 50 MCG tablet Take 50 mcg by mouth daily before breakfast.    .  metolazone (ZAROXOLYN) 2.5 MG tablet Take 1 tablet (2.5 mg total) by mouth as directed. TAKE 1 TABLET AS NEEDED ONLY IF YOUR WEIGHT INCREASES 2-3 POUNDS IN 24 HOURS. (Patient taking differently: Take 2.5 mg by mouth daily. Marland Kitchen)    . omeprazole (PRILOSEC) 20 MG capsule Take 1 capsule (20 mg total) by mouth daily. Medicine to help protect your stomach lining from ulcers while on coumadin 30 capsule 3  . potassium chloride SA (K-DUR,KLOR-CON) 20 MEQ tablet Take 1 tablet (20 mEq total) by mouth daily. 60 tablet 6  . simvastatin (ZOCOR) 10 MG tablet TAKE ONE TABLET BY MOUTH EVERY EVENING. 30 tablet 3  . tolterodine (DETROL LA) 4 MG 24 hr capsule Take 4 mg by mouth daily.    Marland Kitchen torsemide (DEMADEX) 100 MG tablet TAKE ONE TABLET BY MOUTH EVERY MORNING 90 tablet 3  . warfarin (COUMADIN) 2.5 MG tablet Take 1.25-2.5 mg by mouth daily at 6 PM. Take 2.86m on Mondays, Wednesday, Friday. 1.25 mg on Tuesday,Thursday,Saturday,Sunday    . torsemide (DEMADEX) 100 MG tablet STARTING TOMORROW, TAKE A HALF A TABLET DAILY FOR 3 DAYS AND THEN RESTART ONE TABLET DAILY THEREAFTER. (Patient taking differently: Take 100 mg by mouth every morning. )     No current facility-administered medications for this visit.    Functional Status:   In your present state of health, do you have any difficulty performing the following activities: 11/15/2014 11/02/2014  Hearing? N N  Vision? N N  Difficulty concentrating  or making decisions? N N  Walking or climbing stairs? Y Y  Dressing or bathing? Y N  Doing errands, shopping? [name]  Preparing Food and eating ? N -  Using the Toilet? N -  In the past six months, have you accidently leaked urine? Y -  Do you have problems with loss of bowel control? N -  Managing your Medications? Y -  Managing your Finances? N -  Housekeeping or managing your Housekeeping? Y -    Fall/Depression Screening:    PHQ 2/9 Scores 11/15/2014 08/19/2014 06/07/2014  PHQ - 2 Score 0 0 0   Fall Risk  03/19/2015  12/12/2014 12/12/2014 11/15/2014 08/19/2014  Falls in the past year? No No - No No  Number falls in past yr: - - - - -  Injury with Fall? - - - - -  Risk for fall due to : Impaired balance/gait Impaired balance/gait Impaired balance/gait Impaired balance/gait -   Assessment:  Pt is managing well at home with the assistance of family members who check on pt daily, RN CM praised pt for weighing daily, reviewed CHF action plan, RN CM discussed discharge plan with pt and offered [facility] RN health coach (as pt had before) and pt declines this time citing she feels confident she knows CHF action plan and importance of daily weights and calling MD for changes, RN CM reviewed benefits of having health coach contact her at least  monthly but pt refuses.  RN CM faxed case closure letter to primary MD Dr. Quintin Alto and mailed case closure letter to patient's home.  [facility] CM Care Plan Problem One        Most Recent Value   Care Plan Problem One  Knowledge deficity related to CHF disease process   Role Documenting the Problem One  Care Management Coordinator   Care Plan for Problem One  Active   [facility]N Long Term Goal (31-90 days)  pt will have no admissions related to heart failure within 90 days.   THN Long Term Goal Start Date  11/05/14   [facility]N Long Term Goal Met Date  03/19/15   Interventions for Problem One Long Term Goal  RN CM reinforced with pt importance of calling MD early for change in health status and being mindful of weight gain, edema, dyspnea.   [facility]N CM Short Term Goal #2 (0-30 days)  pt will verbalize signs/ symptoms pneumonia/ exacerbation within 30 days.   [facility]N CM Short Term Goal #2 Start Date  12/12/14   [facility] CM Short Term Goal #2 Met Date  03/19/15   Interventions for Short Term Goal #2  RN CM reinforced signs/ symptoms pneumonia exacerbation    THN CM Care Plan Problem Two        Most Recent Value   Care Plan for Problem Two  Active   THN CM Short Term Goal #1 (0-30 days)  Pt not consistently recording  daily weights.   THN CM Short Term Goal #1 Start Date  01/15/15   [facility] CM Short Term Goal #1 Met Date   03/19/15 [pt is recording weight daily]   Interventions for Short Term Goal #2   RN CM ask pt to record weight in [facility] calendar, new [facility] 2017 calendar given and reviewed with pt, gave pt copy of Nondiscrimination and Accessibility Statement.      Plan: discharge today  [name] [facility], BSN [place] Coordinator [phone]

## 2015-03-24 DIAGNOSIS — I509 Heart failure, unspecified: Secondary | ICD-10-CM | POA: Diagnosis not present

## 2015-03-31 DIAGNOSIS — R531 Weakness: Secondary | ICD-10-CM | POA: Diagnosis not present

## 2015-04-01 ENCOUNTER — Ambulatory Visit (INDEPENDENT_AMBULATORY_CARE_PROVIDER_SITE_OTHER): Payer: Commercial Managed Care - HMO | Admitting: *Deleted

## 2015-04-01 DIAGNOSIS — I5033 Acute on chronic diastolic (congestive) heart failure: Secondary | ICD-10-CM

## 2015-04-01 LAB — POCT INR: INR: 2.4

## 2015-04-10 DIAGNOSIS — D519 Vitamin B12 deficiency anemia, unspecified: Secondary | ICD-10-CM | POA: Diagnosis not present

## 2015-04-10 DIAGNOSIS — M25511 Pain in right shoulder: Secondary | ICD-10-CM | POA: Diagnosis not present

## 2015-04-15 DIAGNOSIS — R531 Weakness: Secondary | ICD-10-CM | POA: Diagnosis not present

## 2015-04-15 DIAGNOSIS — D649 Anemia, unspecified: Secondary | ICD-10-CM | POA: Diagnosis not present

## 2015-04-15 DIAGNOSIS — I482 Chronic atrial fibrillation: Secondary | ICD-10-CM | POA: Diagnosis not present

## 2015-04-15 DIAGNOSIS — E876 Hypokalemia: Secondary | ICD-10-CM | POA: Diagnosis not present

## 2015-04-15 DIAGNOSIS — I10 Essential (primary) hypertension: Secondary | ICD-10-CM | POA: Diagnosis not present

## 2015-04-15 DIAGNOSIS — N184 Chronic kidney disease, stage 4 (severe): Secondary | ICD-10-CM | POA: Diagnosis not present

## 2015-04-15 DIAGNOSIS — I5022 Chronic systolic (congestive) heart failure: Secondary | ICD-10-CM | POA: Diagnosis not present

## 2015-04-15 DIAGNOSIS — R5383 Other fatigue: Secondary | ICD-10-CM | POA: Diagnosis not present

## 2015-04-24 DIAGNOSIS — I509 Heart failure, unspecified: Secondary | ICD-10-CM | POA: Diagnosis not present

## 2015-05-01 DIAGNOSIS — R531 Weakness: Secondary | ICD-10-CM | POA: Diagnosis not present

## 2015-05-22 DIAGNOSIS — I509 Heart failure, unspecified: Secondary | ICD-10-CM | POA: Diagnosis not present

## 2015-05-27 ENCOUNTER — Ambulatory Visit (INDEPENDENT_AMBULATORY_CARE_PROVIDER_SITE_OTHER): Payer: Commercial Managed Care - HMO | Admitting: *Deleted

## 2015-05-27 DIAGNOSIS — I5043 Acute on chronic combined systolic (congestive) and diastolic (congestive) heart failure: Secondary | ICD-10-CM

## 2015-05-27 DIAGNOSIS — Z8679 Personal history of other diseases of the circulatory system: Secondary | ICD-10-CM

## 2015-05-27 DIAGNOSIS — I6309 Cerebral infarction due to thrombosis of other precerebral artery: Secondary | ICD-10-CM

## 2015-05-27 DIAGNOSIS — Z953 Presence of xenogenic heart valve: Secondary | ICD-10-CM | POA: Diagnosis not present

## 2015-05-27 DIAGNOSIS — Z9889 Other specified postprocedural states: Secondary | ICD-10-CM | POA: Diagnosis not present

## 2015-05-27 DIAGNOSIS — I5033 Acute on chronic diastolic (congestive) heart failure: Secondary | ICD-10-CM

## 2015-05-27 DIAGNOSIS — I4891 Unspecified atrial fibrillation: Secondary | ICD-10-CM

## 2015-05-27 DIAGNOSIS — Z7901 Long term (current) use of anticoagulants: Secondary | ICD-10-CM

## 2015-05-27 LAB — POCT INR: INR: 1.9

## 2015-06-05 DIAGNOSIS — M7551 Bursitis of right shoulder: Secondary | ICD-10-CM | POA: Diagnosis not present

## 2015-06-12 ENCOUNTER — Encounter (HOSPITAL_COMMUNITY): Payer: Self-pay | Admitting: Emergency Medicine

## 2015-06-12 ENCOUNTER — Telehealth: Payer: Self-pay | Admitting: Cardiology

## 2015-06-12 ENCOUNTER — Emergency Department (HOSPITAL_COMMUNITY)
Admission: EM | Admit: 2015-06-12 | Discharge: 2015-06-12 | Disposition: A | Discharge status: Home / Self Care | Payer: Commercial Managed Care - HMO | Attending: Emergency Medicine | Admitting: Emergency Medicine

## 2015-06-12 DIAGNOSIS — I5032 Chronic diastolic (congestive) heart failure: Secondary | ICD-10-CM | POA: Insufficient documentation

## 2015-06-12 DIAGNOSIS — I130 Hypertensive heart and chronic kidney disease with heart failure and stage 1 through stage 4 chronic kidney disease, or unspecified chronic kidney disease: Secondary | ICD-10-CM | POA: Diagnosis not present

## 2015-06-12 DIAGNOSIS — Z79899 Other long term (current) drug therapy: Secondary | ICD-10-CM | POA: Insufficient documentation

## 2015-06-12 DIAGNOSIS — E669 Obesity, unspecified: Secondary | ICD-10-CM | POA: Insufficient documentation

## 2015-06-12 DIAGNOSIS — N184 Chronic kidney disease, stage 4 (severe): Secondary | ICD-10-CM | POA: Insufficient documentation

## 2015-06-12 DIAGNOSIS — T148 Other injury of unspecified body region: Secondary | ICD-10-CM | POA: Diagnosis not present

## 2015-06-12 DIAGNOSIS — J449 Chronic obstructive pulmonary disease, unspecified: Secondary | ICD-10-CM | POA: Insufficient documentation

## 2015-06-12 DIAGNOSIS — E785 Hyperlipidemia, unspecified: Secondary | ICD-10-CM | POA: Diagnosis not present

## 2015-06-12 DIAGNOSIS — T148XXA Other injury of unspecified body region, initial encounter: Secondary | ICD-10-CM

## 2015-06-12 DIAGNOSIS — N939 Abnormal uterine and vaginal bleeding, unspecified: Secondary | ICD-10-CM | POA: Diagnosis present

## 2015-06-12 DIAGNOSIS — S30814A Abrasion of vagina and vulva, initial encounter: Secondary | ICD-10-CM | POA: Diagnosis not present

## 2015-06-12 LAB — BASIC METABOLIC PANEL
ANION GAP: 10 (ref 5–15)
BUN: 49 mg/dL — AB (ref 6–20)
CO2: 31 mmol/L (ref 22–32)
CREATININE: 1.8 mg/dL — AB (ref 0.44–1.00)
Calcium: 9.2 mg/dL (ref 8.9–10.3)
Chloride: 102 mmol/L (ref 101–111)
GFR calc non Af Amer: 27 mL/min — ABNORMAL LOW (ref >60)
GFR, EST AFRICAN AMERICAN: 31 mL/min — AB (ref >60)
GLUCOSE: 95 mg/dL (ref 65–99)
POTASSIUM: 3.9 mmol/L (ref 3.5–5.1)
Sodium: 143 mmol/L (ref 135–145)

## 2015-06-12 LAB — URINALYSIS, ROUTINE W REFLEX MICROSCOPIC
Bilirubin Urine: NEGATIVE
GLUCOSE, UA: NEGATIVE mg/dL
Hgb urine dipstick: NEGATIVE
KETONES UR: NEGATIVE mg/dL
NITRITE: NEGATIVE
PROTEIN: NEGATIVE mg/dL
Specific Gravity, Urine: <1.005 — ABNORMAL LOW (ref 1.005–1.030)
pH: 7 (ref 5.0–8.0)

## 2015-06-12 LAB — CBC WITH DIFFERENTIAL/PLATELET
BASOS ABS: 0 10*3/uL (ref 0.0–0.1)
BASOS PCT: 1 %
Eosinophils Absolute: 0.1 10*3/uL (ref 0.0–0.7)
Eosinophils Relative: 2 %
HEMATOCRIT: 37.6 % (ref 36.0–46.0)
Hemoglobin: 12.1 g/dL (ref 12.0–15.0)
Lymphocytes Relative: 16 %
Lymphs Abs: 0.6 10*3/uL — ABNORMAL LOW (ref 0.7–4.0)
MCH: 31.5 pg (ref 26.0–34.0)
MCHC: 32.2 g/dL (ref 30.0–36.0)
MCV: 97.9 fL (ref 78.0–100.0)
MONO ABS: 0.4 10*3/uL (ref 0.1–1.0)
Monocytes Relative: 11 %
NEUTROS ABS: 2.6 10*3/uL (ref 1.7–7.7)
Neutrophils Relative %: 70 %
PLATELETS: 139 10*3/uL — AB (ref 150–400)
RBC: 3.84 MIL/uL — AB (ref 3.87–5.11)
RDW: 14.3 % (ref 11.5–15.5)
WBC: 3.6 10*3/uL — AB (ref 4.0–10.5)

## 2015-06-12 LAB — PROTIME-INR
INR: 2.37 — ABNORMAL HIGH (ref 0.00–1.49)
PROTHROMBIN TIME: 25.6 s — AB (ref 11.6–15.2)

## 2015-06-12 LAB — URINE MICROSCOPIC-ADD ON
BACTERIA UA: NONE SEEN
RBC / HPF: NONE SEEN RBC/hpf (ref 0–5)

## 2015-06-12 NOTE — Discharge Instructions (Signed)
Take your usual prescriptions as previously directed.  Wash the abraded area with soap and water at least twice a day, especially after urinating or stooling. Your INR today was 2.37.   Call your regular medical doctor today to schedule a follow up appointment for a recheck within the next 24 to 48 hours.  Return to the Emergency Department immediately if worsening.

## 2015-06-12 NOTE — ED Notes (Signed)
MD at bedside. 

## 2015-06-12 NOTE — ED Provider Notes (Signed)
CSN: [id number]     Arrival date & time 06/12/15  1100 History   First MD Initiated Contact with Patient 06/12/15 1138     Chief Complaint  Patient presents with  . Vaginal Bleeding      HPI Pt was seen at 1145. Per pt and her family, c/o gradual onset and persistence of constant perineal "bleeding" that began this morning. Pt states she was wiping herself after urinating when she "noticed blood on the toilet paper." Pt is unsure where the bleeding is coming from; states "it might be from my vagina." Pt denies injury. Denies fevers, no dysuria, no N/V/D, no abd pain.   Past Medical History  Diagnosis Date  . COPD (chronic obstructive pulmonary disease) ([place])   . History of TIA (transient ischemic attack)     Diagnosed 2003  . History of stroke     2000 [place]  . Obesity   . Juvenile rheumatic fever     Age 18  . Essential hypertension, benign   . Hyperlipidemia   . Mitral regurgitation     Status post bioprosthetic MVR (05/2013)  . CKD (chronic kidney disease) stage 4, GFR 15-29 ml/min (HCC)   . Paroxysmal atrial fibrillation (HCC)     s/p MAZE (05/2013)  . Chronic diastolic congestive heart failure ([place])     a) ECHO (09/2013) EF 50-55%  . Arthritis   . Aortic insufficiency due to bicuspid aortic valve     Moderate by TEE  . 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 S/P Maze operation for atrial fibrillation 05/16/2013    Complete bilateral atrial lesion set using cryothermy and bipolar radiofrequency ablation with oversewing of LA appendage  . NICM (nonischemic cardiomyopathy) ([place])     a) LHC (04/2013) no significant CAD   Past Surgical History  Procedure Laterality Date  . Total knee arthroplasty Right   . Abdominal hysterectomy      Cervical Cancer  . Parathyroid/thyroid surgery      Tumor  . Tee without cardioversion  N/A 04/06/2013    Procedure: TRANSESOPHAGEAL ECHOCARDIOGRAM (TEE);  Surgeon: [name], MD;  [facility];  Service: Cardiology;  Laterality: N/A;  . Minimally invasive maze procedure N/A 05/16/2013    Procedure: MINIMALLY INVASIVE MAZE PROCEDURE;  Surgeon: Rexene Alberts, MD;  Location: [place];  Service: Open Heart Surgery;  Laterality: N/A;  . Intraoperative transesophageal echocardiogram N/A 05/16/2013    Procedure: INTRAOPERATIVE TRANSESOPHAGEAL ECHOCARDIOGRAM;  Surgeon: Rexene Alberts, MD;  Location: [place];  Service: Open Heart Surgery;  Laterality: N/A;  . Mitral valve replacement Right 05/16/2013    Procedure: MINIMALLY INVASIVE MITRAL VALVE (MV) REPLACEMENT;  Surgeon: Rexene Alberts, MD;  Location: [place];  Service: Open Heart Surgery;  Laterality: Right;  . Tee without cardioversion N/A 06/06/2013    Procedure: TRANSESOPHAGEAL ECHOCARDIOGRAM (TEE);  Surgeon: Larey Dresser, MD;  Location: [place];  Service: Cardiovascular;  Laterality: N/A;  . Cardioversion N/A 06/06/2013    Procedure: CARDIOVERSION;  Surgeon: Larey Dresser, MD;  Location: [facility] ENDOSCOPY;  Service: Cardiovascular;  Laterality: N/A;  . Left and right heart catheterization with coronary angiogram N/A 05/04/2013    Procedure: LEFT AND RIGHT HEART CATHETERIZATION WITH CORONARY ANGIOGRAM;  Surgeon: Jettie Booze, MD;  Location: [facility] CATH LAB;  Service: Cardiovascular;  Laterality: N/A;   Family History  Problem Relation Age of Onset  . Heart failure Father   . Heart attack Brother    Social History  Substance Use Topics  . Smoking status: Never Smoker   . Smokeless tobacco: Never Used  . Alcohol Use: No    Review of Systems ROS: Statement: All systems negative except as marked or noted in the HPI; Constitutional: Negative for fever and chills. ; ; Eyes: Negative for eye pain, redness and discharge. ; ; ENMT: Negative for ear pain, hoarseness, nasal congestion, sinus pressure and sore throat. ; ;  Cardiovascular: Negative for chest pain, palpitations, diaphoresis, dyspnea and peripheral edema. ; ; Respiratory: Negative for cough, wheezing and stridor. ; ; Gastrointestinal: Negative for nausea, vomiting, diarrhea, abdominal pain, blood in stool, hematemesis, jaundice and rectal bleeding. . ; ; Genitourinary: Negative for dysuria, flank pain and hematuria. ; ; Musculoskeletal: Negative for back pain and neck pain. Negative for swelling and trauma.; ; Skin: +"bleeding." Negative for pruritus, rash, blisters, bruising and skin lesion.; ; Neuro: Negative for headache, lightheadedness and neck stiffness. Negative for weakness, altered level of consciousness , altered mental status, extremity weakness, paresthesias, involuntary movement, seizure and syncope.      Allergies  Indomethacin and Norvasc  Home Medications   Prior to Admission medications   Medication Sig Start Date End Date Taking? Authorizing Provider  amiodarone (PACERONE) 200 MG tablet Take 0.5 tablets (100 mg total) by mouth daily. 10/10/14  Yes [name], MD  colchicine 0.6 MG tablet Take 0.5 tablets (0.3 mg total) by mouth daily. 10/11/13  Yes Rande Brunt, NP  ferrous sulfate 325 (65 FE) MG tablet Take 325 mg by mouth 2 (two) times daily with a meal.   Yes Historical Provider, MD  levothyroxine (SYNTHROID, LEVOTHROID) 50 MCG tablet Take 50 mcg by mouth daily before breakfast.   Yes Historical Provider, MD  metolazone (ZAROXOLYN) 2.5 MG tablet Take 1 tablet (2.5 mg total) by mouth as directed. TAKE 1 TABLET AS NEEDED ONLY IF YOUR WEIGHT INCREASES 2-3 POUNDS IN 24 HOURS. Patient taking differently: Take 2.5 mg by mouth daily. . 05/13/14  Yes Rexene Alberts, MD  omeprazole (PRILOSEC) 20 MG capsule Take 1 capsule (20 mg total) by mouth daily. Medicine to help protect your stomach lining from ulcers while on coumadin 05/13/14  Yes Rexene Alberts, MD  potassium chloride SA (K-DUR,KLOR-CON) 20 MEQ tablet Take 1 tablet (20 mEq total) by  mouth daily. 05/30/13  Yes Donielle Liston Alba, PA-C  simvastatin (ZOCOR) 10 MG tablet TAKE ONE TABLET BY MOUTH EVERY EVENING. 12/10/13  Yes Tiffany L Reed, DO  tolterodine (DETROL LA) 4 MG 24 hr capsule Take 4 mg by mouth daily.   Yes Historical Provider, MD  torsemide (DEMADEX) 100 MG tablet TAKE ONE TABLET BY MOUTH EVERY MORNING 02/17/15  Yes Larey Dresser, MD  warfarin (COUMADIN) 2.5 MG tablet Take 2.5 mg by mouth daily. At 1700   Yes Historical Provider, MD  torsemide (DEMADEX) 100 MG tablet STARTING TOMORROW, TAKE A HALF A TABLET DAILY FOR 3 DAYS AND THEN RESTART ONE TABLET DAILY THEREAFTER. Patient not taking: Reported on 06/12/2015 05/13/14   Rexene Alberts, MD   BP 147/59 mmHg  Pulse 66  Temp(Src) 97.8 F (36.6 C) (Oral)  Resp 16  Ht 5' (1.524 m)  Wt 151 lb (68.493 kg)  BMI 29.49 kg/m2  SpO2 96% Physical Exam  1150: Physical examination:  Nursing notes reviewed; Vital signs and O2 SAT reviewed;  Constitutional: Well developed, Well nourished,  Well hydrated, In no acute distress; Head:  Normocephalic, atraumatic; Eyes: EOMI, PERRL, No scleral icterus; ENMT: Mouth and pharynx normal, Mucous membranes moist; Neck: Supple, Full range of motion, No lymphadenopathy; Cardiovascular: Regular rate and rhythm, No gallop; Respiratory: Breath sounds clear & equal bilaterally, No wheezes.  Speaking full sentences with ease, Normal respiratory effort/excursion; Chest: Nontender, Movement normal; Abdomen: Soft, Nontender, Nondistended, Normal bowel sounds; Genitourinary: No CVA tenderness. Pelvic exam performed with permission of pt and female ED tech assist during exam.  External genitalia w/o lesions. Vaginal vault without discharge, no bleeding. No obvious urethral or rectal bleeding. Punctate skin abrasion left labial area with small amount of oozing; pressure held with improvement. No ecchymosis, no soft tissue crepitus, no erythema.; Extremities: Pulses normal, No tenderness, No edema, No calf edema  or asymmetry.; Neuro: AA&Ox3, Major CN grossly intact.  Speech clear. No gross focal motor or sensory deficits in extremities.; Skin: Color normal, Warm, Dry.     ED Course  Procedures (including critical care time) Labs Review   Imaging Review  I have personally reviewed and evaluated these images and lab results as part of my medical decision-making.   EKG Interpretation None      MDM  MDM Reviewed: nursing note, previous chart and vitals Reviewed previous: labs Interpretation: labs      Results for orders placed or performed during the hospital encounter of 06/12/15  Urinalysis, Routine w reflex microscopic (not at [facility])  Result Value Ref Range   Color, Urine YELLOW YELLOW   APPearance CLEAR CLEAR   Specific Gravity, Urine <1.005 (L) 1.005 - 1.030   pH 7.0 5.0 - 8.0   Glucose, UA NEGATIVE NEGATIVE mg/dL   Hgb urine dipstick NEGATIVE NEGATIVE   Bilirubin Urine NEGATIVE NEGATIVE   Ketones, ur NEGATIVE NEGATIVE mg/dL   Protein, ur NEGATIVE NEGATIVE mg/dL   Nitrite NEGATIVE NEGATIVE   Leukocytes, UA TRACE (A) NEGATIVE  CBC with Differential  Result Value Ref Range   WBC 3.6 (L) 4.0 - 10.5 K/uL   RBC 3.84 (L) 3.87 - 5.11 MIL/uL   Hemoglobin 12.1 12.0 - 15.0 g/dL   HCT 37.6 36.0 - 46.0 %   MCV 97.9 78.0 - 100.0 fL   MCH 31.5 26.0 - 34.0 pg   MCHC 32.2 30.0 - 36.0 g/dL   RDW 14.3 11.5 - 15.5 %   Platelets 139 (L) 150 - 400 K/uL   Neutrophils Relative % 70 %   Neutro Abs 2.6 1.7 - 7.7 K/uL   Lymphocytes Relative 16 %   Lymphs Abs 0.6 (L) 0.7 - 4.0 K/uL   Monocytes Relative 11 %   Monocytes Absolute 0.4 0.1 - 1.0 K/uL   Eosinophils Relative 2 %   Eosinophils Absolute 0.1 0.0 - 0.7 K/uL   Basophils Relative 1 %   Basophils Absolute 0.0 0.0 - 0.1 K/uL  Basic metabolic panel  Result Value Ref Range   Sodium 143 135 - 145 mmol/L   Potassium 3.9 3.5 - 5.1 mmol/L   Chloride 102 101 - 111 mmol/L   CO2 31 22 - 32 mmol/L   Glucose, Bld 95 65 - 99 mg/dL   BUN 49  (H) 6 - 20 mg/dL   Creatinine, Ser 1.80 (H) 0.44 - 1.00 mg/dL   Calcium 9.2 8.9 - 10.3 mg/dL   GFR calc non Af Amer 27 (L) >60 mL/min   GFR calc Af Amer 31 (L) >60 mL/min   Anion gap 10 5 - 15  Protime-INR  Result  Value Ref Range   Prothrombin Time 25.6 (H) 11.6 - 15.2 seconds   INR 2.37 (H) 0.00 - 1.49  Urine microscopic-add on  Result Value Ref Range   Squamous Epithelial / LPF 0-5 (A) NONE SEEN   WBC, UA 0-5 0 - 5 WBC/hpf   RBC / HPF NONE SEEN 0 - 5 RBC/hpf   Bacteria, UA NONE SEEN NONE SEEN    Results for BEHATI, NEWSUM (MRN [id number]) as of 06/12/2015 14:16  Ref. Range 05/09/2014 11:35 05/10/2014 06:51 05/11/2014 06:19 05/12/2014 06:21 05/13/2014 06:25 11/02/2014 01:15 11/03/2014 05:56 06/12/2015 11:28  BUN Latest Ref Range: 6-20 mg/dL 19 21 21 24  (H) 27 (H) 21 (H) 20 49 (H)  Creatinine Latest Ref Range: 0.44-1.00 mg/dL 1.89 (H) 1.83 (H) 1.79 (H) 1.79 (H) 1.93 (H) 1.59 (H) 1.42 (H) 1.80 (H)    1415:  BUN/Cr and platelets per baseline. Bleeding is from punctate superficial skin abrasion left labial area; pressure held with improvement. No urethral, vaginal or obvious rectal bleeding. Pt states she is ready to go home now. Tx symptomatically. Dx and testing d/w pt and family.  Questions answered.  Verb understanding, agreeable to d/c home with outpt f/u.   Francine Graven, DO 06/16/15 [phone]

## 2015-06-12 NOTE — Telephone Encounter (Signed)
I spoke with Ms. McDaniels daughter and instructed her to take her Mom to the ER at [facility] per Lattie Haw, [place].

## 2015-06-12 NOTE — ED Notes (Addendum)
Pt reports urinating this morning. States she went to wipe herself and noticed a lot of blood. Pt unsure if blood is in her urine or if it is vaginal bleeding. Pt is on Coumadin. Denies abdominal pain or urinary symptoms. Sats 88% in triage. Pt is on 2L at home. Did not bring O2 tank.

## 2015-06-14 LAB — URINE CULTURE

## 2015-06-15 ENCOUNTER — Telehealth: Payer: Self-pay

## 2015-06-15 NOTE — Telephone Encounter (Signed)
Post ED Visit - Positive Culture Follow-up  Culture report reviewed by antimicrobial stewardship pharmacist:  []  Elenor Quinones, Pharm.D. []  Heide Guile, Pharm.D., BCPS []  Parks Neptune, Pharm.D. []  Alycia Rossetti, Pharm.D., BCPS []  [place], Pharm.D., BCPS, AAHIVP []  Legrand Como, Pharm.D., BCPS, AAHIVP []  Milus Glazier, Pharm.D. []  Stephens November, Pharm.D. Cruz Condon Pharm D Positive Urine culture No treatment and no further patient follow-up is required at this time.  Genia Del 06/15/2015, 1:35 PM

## 2015-06-22 DIAGNOSIS — I509 Heart failure, unspecified: Secondary | ICD-10-CM | POA: Diagnosis not present

## 2015-06-24 ENCOUNTER — Emergency Department (HOSPITAL_COMMUNITY): Payer: Commercial Managed Care - HMO

## 2015-06-24 ENCOUNTER — Observation Stay (HOSPITAL_COMMUNITY)
Admission: EM | Admit: 2015-06-24 | Discharge: 2015-06-27 | Disposition: A | Discharge status: Home / Self Care | Payer: Commercial Managed Care - HMO | Attending: Internal Medicine | Admitting: Internal Medicine

## 2015-06-24 ENCOUNTER — Encounter (HOSPITAL_COMMUNITY): Payer: Self-pay | Admitting: Emergency Medicine

## 2015-06-24 DIAGNOSIS — M1990 Unspecified osteoarthritis, unspecified site: Secondary | ICD-10-CM | POA: Diagnosis not present

## 2015-06-24 DIAGNOSIS — J189 Pneumonia, unspecified organism: Principal | ICD-10-CM | POA: Insufficient documentation

## 2015-06-24 DIAGNOSIS — J449 Chronic obstructive pulmonary disease, unspecified: Secondary | ICD-10-CM | POA: Insufficient documentation

## 2015-06-24 DIAGNOSIS — E038 Other specified hypothyroidism: Secondary | ICD-10-CM | POA: Diagnosis not present

## 2015-06-24 DIAGNOSIS — R0682 Tachypnea, not elsewhere classified: Secondary | ICD-10-CM | POA: Diagnosis not present

## 2015-06-24 DIAGNOSIS — R531 Weakness: Secondary | ICD-10-CM | POA: Diagnosis not present

## 2015-06-24 DIAGNOSIS — J9601 Acute respiratory failure with hypoxia: Secondary | ICD-10-CM | POA: Diagnosis present

## 2015-06-24 DIAGNOSIS — Z952 Presence of prosthetic heart valve: Secondary | ICD-10-CM

## 2015-06-24 DIAGNOSIS — N179 Acute kidney failure, unspecified: Secondary | ICD-10-CM | POA: Diagnosis present

## 2015-06-24 DIAGNOSIS — Z954 Presence of other heart-valve replacement: Secondary | ICD-10-CM

## 2015-06-24 DIAGNOSIS — R404 Transient alteration of awareness: Secondary | ICD-10-CM | POA: Diagnosis not present

## 2015-06-24 DIAGNOSIS — N289 Disorder of kidney and ureter, unspecified: Secondary | ICD-10-CM | POA: Insufficient documentation

## 2015-06-24 DIAGNOSIS — M79606 Pain in leg, unspecified: Secondary | ICD-10-CM | POA: Diagnosis present

## 2015-06-24 DIAGNOSIS — Z7901 Long term (current) use of anticoagulants: Secondary | ICD-10-CM | POA: Insufficient documentation

## 2015-06-24 DIAGNOSIS — N184 Chronic kidney disease, stage 4 (severe): Secondary | ICD-10-CM | POA: Diagnosis not present

## 2015-06-24 DIAGNOSIS — E785 Hyperlipidemia, unspecified: Secondary | ICD-10-CM | POA: Insufficient documentation

## 2015-06-24 DIAGNOSIS — I130 Hypertensive heart and chronic kidney disease with heart failure and stage 1 through stage 4 chronic kidney disease, or unspecified chronic kidney disease: Secondary | ICD-10-CM | POA: Insufficient documentation

## 2015-06-24 DIAGNOSIS — N183 Chronic kidney disease, stage 3 unspecified: Secondary | ICD-10-CM | POA: Diagnosis present

## 2015-06-24 DIAGNOSIS — I4821 Permanent atrial fibrillation: Secondary | ICD-10-CM | POA: Diagnosis present

## 2015-06-24 DIAGNOSIS — Z79899 Other long term (current) drug therapy: Secondary | ICD-10-CM | POA: Insufficient documentation

## 2015-06-24 DIAGNOSIS — I639 Cerebral infarction, unspecified: Secondary | ICD-10-CM | POA: Diagnosis not present

## 2015-06-24 DIAGNOSIS — I00 Rheumatic fever without heart involvement: Secondary | ICD-10-CM | POA: Diagnosis present

## 2015-06-24 DIAGNOSIS — E039 Hypothyroidism, unspecified: Secondary | ICD-10-CM | POA: Diagnosis present

## 2015-06-24 DIAGNOSIS — R0902 Hypoxemia: Secondary | ICD-10-CM | POA: Diagnosis not present

## 2015-06-24 DIAGNOSIS — I5032 Chronic diastolic (congestive) heart failure: Secondary | ICD-10-CM | POA: Insufficient documentation

## 2015-06-24 DIAGNOSIS — R918 Other nonspecific abnormal finding of lung field: Secondary | ICD-10-CM | POA: Diagnosis not present

## 2015-06-24 LAB — CBC WITH DIFFERENTIAL/PLATELET
BASOS ABS: 0 10*3/uL (ref 0.0–0.1)
Basophils Relative: 0 %
EOS ABS: 0 10*3/uL (ref 0.0–0.7)
EOS PCT: 0 %
HCT: 39.1 % (ref 36.0–46.0)
Hemoglobin: 12.4 g/dL (ref 12.0–15.0)
LYMPHS PCT: 12 %
Lymphs Abs: 1 10*3/uL (ref 0.7–4.0)
MCH: 30.8 pg (ref 26.0–34.0)
MCHC: 31.7 g/dL (ref 30.0–36.0)
MCV: 97 fL (ref 78.0–100.0)
Monocytes Absolute: 0.7 10*3/uL (ref 0.1–1.0)
Monocytes Relative: 7 %
Neutro Abs: 7.1 10*3/uL (ref 1.7–7.7)
Neutrophils Relative %: 81 %
PLATELETS: 111 10*3/uL — AB (ref 150–400)
RBC: 4.03 MIL/uL (ref 3.87–5.11)
RDW: 14.1 % (ref 11.5–15.5)
WBC: 8.8 10*3/uL (ref 4.0–10.5)

## 2015-06-24 LAB — COMPREHENSIVE METABOLIC PANEL
ALT: 29 U/L (ref 14–54)
AST: 43 U/L — AB (ref 15–41)
Albumin: 3.5 g/dL (ref 3.5–5.0)
Alkaline Phosphatase: 107 U/L (ref 38–126)
Anion gap: 10 (ref 5–15)
BILIRUBIN TOTAL: 1.3 mg/dL — AB (ref 0.3–1.2)
BUN: 40 mg/dL — AB (ref 6–20)
CO2: 31 mmol/L (ref 22–32)
CREATININE: 1.91 mg/dL — AB (ref 0.44–1.00)
Calcium: 8.8 mg/dL — ABNORMAL LOW (ref 8.9–10.3)
Chloride: 99 mmol/L — ABNORMAL LOW (ref 101–111)
GFR calc Af Amer: 29 mL/min — ABNORMAL LOW (ref >60)
GFR, EST NON AFRICAN AMERICAN: 25 mL/min — AB (ref >60)
Glucose, Bld: 107 mg/dL — ABNORMAL HIGH (ref 65–99)
POTASSIUM: 4 mmol/L (ref 3.5–5.1)
Sodium: 140 mmol/L (ref 135–145)
TOTAL PROTEIN: 7.5 g/dL (ref 6.5–8.1)

## 2015-06-24 LAB — URINALYSIS, ROUTINE W REFLEX MICROSCOPIC
Bilirubin Urine: NEGATIVE
Glucose, UA: NEGATIVE mg/dL
Ketones, ur: NEGATIVE mg/dL
LEUKOCYTES UA: NEGATIVE
NITRITE: NEGATIVE
PROTEIN: NEGATIVE mg/dL
Specific Gravity, Urine: <1.005 — ABNORMAL LOW (ref 1.005–1.030)
pH: 5.5 (ref 5.0–8.0)

## 2015-06-24 LAB — URINE MICROSCOPIC-ADD ON

## 2015-06-24 LAB — PROTIME-INR
INR: 2.49 — AB (ref 0.00–1.49)
PROTHROMBIN TIME: 26.6 s — AB (ref 11.6–15.2)

## 2015-06-24 MED ORDER — POTASSIUM CHLORIDE CRYS ER 20 MEQ PO TBCR
20.0000 meq | EXTENDED_RELEASE_TABLET | Freq: Every day | ORAL | Status: DC
  Administered 2015-06-25 – 2015-06-27 (×3): 20 meq via ORAL
  Filled 2015-06-24 (×3): qty 1

## 2015-06-24 MED ORDER — DEXTROSE 5 % IV SOLN
500.0000 mg | INTRAVENOUS | Status: DC
  Administered 2015-06-24 – 2015-06-26 (×3): 500 mg via INTRAVENOUS
  Filled 2015-06-24 (×6): qty 500

## 2015-06-24 MED ORDER — COLCHICINE 0.6 MG PO TABS
0.3000 mg | ORAL_TABLET | Freq: Every day | ORAL | Status: DC
  Administered 2015-06-25 – 2015-06-27 (×3): 0.3 mg via ORAL
  Filled 2015-06-24 (×3): qty 1

## 2015-06-24 MED ORDER — WARFARIN SODIUM 5 MG PO TABS
2.5000 mg | ORAL_TABLET | Freq: Once | ORAL | Status: AC
  Administered 2015-06-24: 2.5 mg via ORAL

## 2015-06-24 MED ORDER — SODIUM CHLORIDE 0.9 % IV SOLN
INTRAVENOUS | Status: DC
  Administered 2015-06-24 – 2015-06-26 (×2): via INTRAVENOUS

## 2015-06-24 MED ORDER — DEXTROSE 5 % IV SOLN
1.0000 g | Freq: Once | INTRAVENOUS | Status: AC
  Administered 2015-06-24: 1 g via INTRAVENOUS
  Filled 2015-06-24: qty 10

## 2015-06-24 MED ORDER — SODIUM CHLORIDE 0.9 % IV BOLUS (SEPSIS)
500.0000 mL | Freq: Once | INTRAVENOUS | Status: AC
  Administered 2015-06-24: 500 mL via INTRAVENOUS

## 2015-06-24 MED ORDER — WARFARIN - PHARMACIST DOSING INPATIENT
Freq: Every day | Status: DC
  Administered 2015-06-25 – 2015-06-26 (×2)

## 2015-06-24 MED ORDER — DEXTROSE 5 % IV SOLN: 500.0000 mg | INTRAVENOUS | Status: DC

## 2015-06-24 MED ORDER — PANTOPRAZOLE SODIUM 40 MG PO TBEC
40.0000 mg | DELAYED_RELEASE_TABLET | Freq: Every day | ORAL | Status: DC
  Administered 2015-06-25 – 2015-06-27 (×3): 40 mg via ORAL
  Filled 2015-06-24 (×3): qty 1

## 2015-06-24 MED ORDER — AZITHROMYCIN 500 MG IV SOLR
INTRAVENOUS | Status: AC
  Filled 2015-06-24: qty 500

## 2015-06-24 MED ORDER — ACETAMINOPHEN 325 MG PO TABS
650.0000 mg | ORAL_TABLET | Freq: Once | ORAL | Status: AC
  Administered 2015-06-24: 650 mg via ORAL
  Filled 2015-06-24: qty 2

## 2015-06-24 MED ORDER — DEXTROSE 5 % IV SOLN
1.0000 g | INTRAVENOUS | Status: DC
  Administered 2015-06-25 – 2015-06-26 (×2): 1 g via INTRAVENOUS
  Filled 2015-06-24 (×5): qty 10

## 2015-06-24 MED ORDER — AMIODARONE HCL 200 MG PO TABS
100.0000 mg | ORAL_TABLET | Freq: Every day | ORAL | Status: DC
  Administered 2015-06-25 – 2015-06-27 (×3): 100 mg via ORAL
  Filled 2015-06-24 (×3): qty 1

## 2015-06-24 MED ORDER — FESOTERODINE FUMARATE ER 4 MG PO TB24
8.0000 mg | ORAL_TABLET | Freq: Every day | ORAL | Status: DC
  Administered 2015-06-25 – 2015-06-27 (×3): 8 mg via ORAL
  Filled 2015-06-24 (×2): qty 2
  Filled 2015-06-24: qty 1
  Filled 2015-06-24 (×3): qty 2

## 2015-06-24 MED ORDER — WARFARIN SODIUM 5 MG PO TABS
2.5000 mg | ORAL_TABLET | Freq: Every day | ORAL | Status: DC
Start: 2015-06-25 — End: 2015-06-27
  Administered 2015-06-25 – 2015-06-26 (×2): 2.5 mg via ORAL
  Filled 2015-06-24 (×2): qty 1

## 2015-06-24 MED ORDER — SIMVASTATIN 10 MG PO TABS
10.0000 mg | ORAL_TABLET | Freq: Every day | ORAL | Status: DC
  Administered 2015-06-24 – 2015-06-26 (×3): 10 mg via ORAL
  Filled 2015-06-24 (×3): qty 1

## 2015-06-24 MED ORDER — LEVOTHYROXINE SODIUM 50 MCG PO TABS
50.0000 ug | ORAL_TABLET | Freq: Every day | ORAL | Status: DC
Start: 2015-06-25 — End: 2015-06-27
  Administered 2015-06-25 – 2015-06-27 (×3): 50 ug via ORAL
  Filled 2015-06-24 (×3): qty 1

## 2015-06-24 NOTE — Progress Notes (Signed)
Pharmacy Antibiotic Note  [name] [name] is a 75 y.o. female admitted on 06/24/2015 with pneumonia.  Pharmacy has been consulted for Ceftriaxone dosing.  Plan: Ceftriaxone 1gm IV every 24 hours  Height: 5\' 2"  (157.5 cm) Weight: 146 lb (66.225 kg) IBW/kg (Calculated) : 50.1  Temp (24hrs), Avg:100.6 F (38.1 C), Min:99.8 F (37.7 C), Max:101.8 F (38.8 C)   Recent Labs Lab 06/24/15 1549  WBC 8.8  CREATININE 1.91*    Estimated Creatinine Clearance: 23 mL/min (by C-G formula based on Cr of 1.91).    Allergies  Allergen Reactions  . Indomethacin Other (See Comments)    dizziness  . Norvasc [Amlodipine Besylate] Cough    Antimicrobials this admission: Ceftriaxone 4/18 >>  Zithromax 4/18 >>   Microbiology results: 4/18 BCx: pending 4/18 UCx: pending  Thank you for allowing pharmacy to be a part of this patient's care.  Isac Sarna, BS [place], [place] Clinical Pharmacist Pager [phone] 06/24/2015 8:59 PM

## 2015-06-24 NOTE — ED Provider Notes (Addendum)
CSN: [id number]     Arrival date & time 06/24/15  1518 History   First MD Initiated Contact with Patient 06/24/15 1521     Chief Complaint  Patient presents with  . Leg Pain     (Consider location/radiation/quality/duration/timing/severity/associated sxs/prior Treatment) HPI...... patient states she is so weak she is unable to get up and walk today. Low-grade fever. No prodromal illnesses. No dysuria, cough, chest pain, nausea, vomiting, diarrhea. Severity is moderate. Nothing makes symptoms better or worse.  Past Medical History  Diagnosis Date  . COPD (chronic obstructive pulmonary disease) ([place])   . History of TIA (transient ischemic attack)     Diagnosed 2003  . History of stroke     2000 [place]  . Obesity   . Juvenile rheumatic fever     Age 21  . Essential hypertension, benign   . Hyperlipidemia   . Mitral regurgitation     Status post bioprosthetic MVR (05/2013)  . CKD (chronic kidney disease) stage 4, GFR 15-29 ml/min (HCC)   . Paroxysmal atrial fibrillation (HCC)     s/p MAZE (05/2013)  . Chronic diastolic congestive heart failure ([place])     a) ECHO (09/2013) EF 50-55%  . Arthritis   . Aortic insufficiency due to bicuspid aortic valve     Moderate by TEE  . 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 S/P Maze operation for atrial fibrillation 05/16/2013    Complete bilateral atrial lesion set using cryothermy and bipolar radiofrequency ablation with oversewing of LA appendage  . NICM (nonischemic cardiomyopathy) ([place])     a) LHC (04/2013) no significant CAD   Past Surgical History  Procedure Laterality Date  . Total knee arthroplasty Right   . Abdominal hysterectomy      Cervical Cancer  . Parathyroid/thyroid surgery      Tumor  . Tee without cardioversion N/A 04/06/2013    Procedure: TRANSESOPHAGEAL ECHOCARDIOGRAM  (TEE);  Surgeon: [name], MD;  [facility];  Service: Cardiology;  Laterality: N/A;  . Minimally invasive maze procedure N/A 05/16/2013    Procedure: MINIMALLY INVASIVE MAZE PROCEDURE;  Surgeon: Rexene Alberts, MD;  Location: [place];  Service: Open Heart Surgery;  Laterality: N/A;  . Intraoperative transesophageal echocardiogram N/A 05/16/2013    Procedure: INTRAOPERATIVE TRANSESOPHAGEAL ECHOCARDIOGRAM;  Surgeon: Rexene Alberts, MD;  Location: [place];  Service: Open Heart Surgery;  Laterality: N/A;  . Mitral valve replacement Right 05/16/2013    Procedure: MINIMALLY INVASIVE MITRAL VALVE (MV) REPLACEMENT;  Surgeon: Rexene Alberts, MD;  Location: [place];  Service: Open Heart Surgery;  Laterality: Right;  . Tee without cardioversion N/A 06/06/2013    Procedure: TRANSESOPHAGEAL ECHOCARDIOGRAM (TEE);  Surgeon: Larey Dresser, MD;  Location: [place];  Service: Cardiovascular;  Laterality: N/A;  . Cardioversion N/A 06/06/2013    Procedure: CARDIOVERSION;  Surgeon: Larey Dresser, MD;  Location: [facility] ENDOSCOPY;  Service: Cardiovascular;  Laterality: N/A;  . Left and right heart catheterization with coronary angiogram N/A 05/04/2013    Procedure: LEFT AND RIGHT HEART CATHETERIZATION WITH CORONARY ANGIOGRAM;  Surgeon: Jettie Booze, MD;  Location: [facility] CATH LAB;  Service: Cardiovascular;  Laterality: N/A;   Family History  Problem Relation Age of Onset  . Heart failure Father   . Heart attack Brother    Social History  Substance Use Topics  .  Smoking status: Never Smoker   . Smokeless tobacco: Never Used  . Alcohol Use: No   OB History    No data available     Review of Systems  All other systems reviewed and are negative.     Allergies  Indomethacin and Norvasc  Home Medications   Prior to Admission medications   Medication Sig Start Date End Date Taking? Authorizing Provider  amiodarone (PACERONE) 200 MG tablet Take 0.5 tablets (100 mg total) by mouth daily.  10/10/14  Yes [name], MD  colchicine 0.6 MG tablet Take 0.5 tablets (0.3 mg total) by mouth daily. 10/11/13  Yes Rande Brunt, NP  ferrous sulfate 325 (65 FE) MG tablet Take 325 mg by mouth 2 (two) times daily with a meal.   Yes Historical Provider, MD  levothyroxine (SYNTHROID, LEVOTHROID) 50 MCG tablet Take 50 mcg by mouth daily before breakfast.   Yes Historical Provider, MD  metolazone (ZAROXOLYN) 2.5 MG tablet Take 1 tablet (2.5 mg total) by mouth as directed. TAKE 1 TABLET AS NEEDED ONLY IF YOUR WEIGHT INCREASES 2-3 POUNDS IN 24 HOURS. Patient taking differently: Take 2.5 mg by mouth daily. . 05/13/14  Yes Rexene Alberts, MD  omeprazole (PRILOSEC) 20 MG capsule Take 1 capsule (20 mg total) by mouth daily. Medicine to help protect your stomach lining from ulcers while on coumadin 05/13/14  Yes Rexene Alberts, MD  potassium chloride SA (K-DUR,KLOR-CON) 20 MEQ tablet Take 1 tablet (20 mEq total) by mouth daily. 05/30/13  Yes Donielle Liston Alba, PA-C  simvastatin (ZOCOR) 10 MG tablet TAKE ONE TABLET BY MOUTH EVERY EVENING. 12/10/13  Yes Tiffany L Reed, DO  tolterodine (DETROL LA) 4 MG 24 hr capsule Take 4 mg by mouth daily.   Yes Historical Provider, MD  torsemide (DEMADEX) 100 MG tablet TAKE ONE TABLET BY MOUTH EVERY MORNING 02/17/15  Yes Larey Dresser, MD  warfarin (COUMADIN) 2.5 MG tablet Take 2.5 mg by mouth daily. At 1700   Yes Historical Provider, MD  torsemide (DEMADEX) 100 MG tablet STARTING TOMORROW, TAKE A HALF A TABLET DAILY FOR 3 DAYS AND THEN RESTART ONE TABLET DAILY THEREAFTER. Patient not taking: Reported on 06/12/2015 05/13/14   Rexene Alberts, MD   BP 135/62 mmHg  Pulse 73  Temp(Src) 100.2 F (37.9 C) (Oral)  Resp 16  Ht 5\' 2"  (1.575 m)  Wt 146 lb (66.225 kg)  BMI 26.70 kg/m2  SpO2 96% Physical Exam  Constitutional: She is oriented to person, place, and time.  No acute distress.  HENT:  Head: Normocephalic and atraumatic.  Eyes: Conjunctivae and EOM are normal. Pupils  are equal, round, and reactive to light.  Neck: Normal range of motion. Neck supple.  Cardiovascular: Normal rate and regular rhythm.   Pulmonary/Chest: Effort normal and breath sounds normal.  Abdominal: Soft. Bowel sounds are normal.  Musculoskeletal: Normal range of motion.  Neurological: She is alert and oriented to person, place, and time.  Skin: Skin is warm and dry.  Psychiatric: She has a normal mood and affect. Her behavior is normal.  Nursing note and vitals reviewed.   ED Course  Procedures (including critical care time) Labs Review Labs Reviewed  COMPREHENSIVE METABOLIC PANEL - Abnormal; Notable for the following:    Chloride 99 (*)    Glucose, Bld 107 (*)    BUN 40 (*)    Creatinine, Ser 1.91 (*)    Calcium 8.8 (*)    AST 43 (*)    Total Bilirubin  1.3 (*)    GFR calc non Af Amer 25 (*)    GFR calc Af Amer 29 (*)    All other components within normal limits  CBC WITH DIFFERENTIAL/PLATELET - Abnormal; Notable for the following:    Platelets 111 (*)    All other components within normal limits  URINALYSIS, ROUTINE W REFLEX MICROSCOPIC (NOT AT [facility])    Imaging Review Dg Chest 2 View  06/24/2015  CLINICAL DATA:  Sudden onset weakness.  History of COPD. EXAM: CHEST  2 VIEW COMPARISON:  01/09/2015 FINDINGS: The heart is enlarged. Prosthetic valve. There is right upper lobe infiltrate consistent with infectious process. Chronic bibasilar changes are present. There is perihilar peribronchial thickening. IMPRESSION: 1. Right upper lobe infiltrate. 2. Followup PA and lateral chest X-ray is recommended in 3-4 weeks following trial of antibiotic therapy to ensure resolution and exclude underlying malignancy. 3. Cardiomegaly without pulmonary edema. Electronically Signed   By: Nolon Nations M.D.   On: 06/24/2015 16:31   I have personally reviewed and evaluated these images and lab results as part of my medical decision-making.   EKG Interpretation None      MDM   Final  diagnoses:  CAP (community acquired pneumonia)  Renal insufficiency    Chest x-ray reviewed reveals a right upper lobe infiltrate. IV Zithromax, IV Rocephin. Admit to general medicine.    [name], MD 06/24/15 [place], MD 06/24/15 [place], MD 06/24/15 [phone]

## 2015-06-24 NOTE — H&P (Signed)
PCP:   [name], MD   Chief Complaint:  Generalized  weakness  HPI:  75 year old female who  has a past medical history of COPD (chronic obstructive pulmonary disease) ([place]); History of TIA (transient ischemic attack); History of stroke; Obesity; Juvenile rheumatic fever; Essential hypertension, benign; Hyperlipidemia; Mitral regurgitation; CKD (chronic kidney disease) stage 4, GFR 15-29 ml/min ([place]); Paroxysmal atrial fibrillation ([place]); Chronic diastolic congestive heart failure ([place]); Arthritis; Aortic insufficiency due to bicuspid aortic valve; S/P minimally invasive mitral valve replacement with bioprosthetic valve and maze procedure (05/16/2013); S/P Maze operation for atrial fibrillation (05/16/2013); and NICM (nonischemic cardiomyopathy) ([place]). Today was brought to the hospital for generalized weakness, patient denies any symptoms. As per daughter patient was found to have generalized weakness over the past 2-3 days. Though she denies any complaints of chest pain or shortness of breath. No cough. Patient usually able to carry out her ADLs and IADLs, she lives by herself and is pretty functional. Today in the ED chest x-ray showed right upper lobe infiltrate. Patient started on Rocephin and Zithromax for community-acquired pneumonia.   Allergies:   Allergies  Allergen Reactions  . Indomethacin Other (See Comments)    dizziness  . Norvasc [Amlodipine Besylate] Cough      Past Medical History  Diagnosis Date  . COPD (chronic obstructive pulmonary disease) ([place])   . History of TIA (transient ischemic attack)     Diagnosed 2003  . History of stroke     2000 [place]  . Obesity   . Juvenile rheumatic fever     Age 70  . Essential hypertension, benign   . Hyperlipidemia   . Mitral regurgitation     Status post bioprosthetic MVR (05/2013)  . CKD (chronic kidney disease) stage 4, GFR 15-29 ml/min (HCC)   . Paroxysmal atrial fibrillation (HCC)     s/p MAZE (05/2013)  . Chronic  diastolic congestive heart failure ([place])     a) ECHO (09/2013) EF 50-55%  . Arthritis   . Aortic insufficiency due to bicuspid aortic valve     Moderate by TEE  . 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 S/P Maze operation for atrial fibrillation 05/16/2013    Complete bilateral atrial lesion set using cryothermy and bipolar radiofrequency ablation with oversewing of LA appendage  . NICM (nonischemic cardiomyopathy) ([place])     a) LHC (04/2013) no significant CAD    Past Surgical History  Procedure Laterality Date  . Total knee arthroplasty Right   . Abdominal hysterectomy      Cervical Cancer  . Parathyroid/thyroid surgery      Tumor  . Tee without cardioversion N/A 04/06/2013    Procedure: TRANSESOPHAGEAL ECHOCARDIOGRAM (TEE);  Surgeon: [name], MD;  [facility];  Service: Cardiology;  Laterality: N/A;  . Minimally invasive maze procedure N/A 05/16/2013    Procedure: MINIMALLY INVASIVE MAZE PROCEDURE;  Surgeon: Rexene Alberts, MD;  Location: [place];  Service: Open Heart Surgery;  Laterality: N/A;  . Intraoperative transesophageal echocardiogram N/A 05/16/2013    Procedure: INTRAOPERATIVE TRANSESOPHAGEAL ECHOCARDIOGRAM;  Surgeon: Rexene Alberts, MD;  Location: [place];  Service: Open Heart Surgery;  Laterality: N/A;  . Mitral valve replacement Right 05/16/2013    Procedure: MINIMALLY INVASIVE MITRAL VALVE (MV) REPLACEMENT;  Surgeon: Rexene Alberts, MD;  Location: [place];  Service: Open Heart Surgery;  Laterality: Right;  . Tee without cardioversion N/A 06/06/2013    Procedure: TRANSESOPHAGEAL ECHOCARDIOGRAM (TEE);  Surgeon: Larey Dresser, MD;  Location: [place];  Service: Cardiovascular;  Laterality: N/A;  . Cardioversion N/A 06/06/2013    Procedure: CARDIOVERSION;  Surgeon: Larey Dresser, MD;  Location: [facility]  ENDOSCOPY;  Service: Cardiovascular;  Laterality: N/A;  . Left and right heart catheterization with coronary angiogram N/A 05/04/2013    Procedure: LEFT AND RIGHT HEART CATHETERIZATION WITH CORONARY ANGIOGRAM;  Surgeon: Jettie Booze, MD;  Location: [facility] CATH LAB;  Service: Cardiovascular;  Laterality: N/A;    Prior to Admission medications   Medication Sig Start Date End Date Taking? Authorizing Provider  amiodarone (PACERONE) 200 MG tablet Take 0.5 tablets (100 mg total) by mouth daily. 10/10/14  Yes [name], MD  colchicine 0.6 MG tablet Take 0.5 tablets (0.3 mg total) by mouth daily. 10/11/13  Yes Rande Brunt, NP  ferrous sulfate 325 (65 FE) MG tablet Take 325 mg by mouth 2 (two) times daily with a meal.   Yes Historical Provider, MD  levothyroxine (SYNTHROID, LEVOTHROID) 50 MCG tablet Take 50 mcg by mouth daily before breakfast.   Yes Historical Provider, MD  metolazone (ZAROXOLYN) 2.5 MG tablet Take 1 tablet (2.5 mg total) by mouth as directed. TAKE 1 TABLET AS NEEDED ONLY IF YOUR WEIGHT INCREASES 2-3 POUNDS IN 24 HOURS. Patient taking differently: Take 2.5 mg by mouth daily. . 05/13/14  Yes Rexene Alberts, MD  omeprazole (PRILOSEC) 20 MG capsule Take 1 capsule (20 mg total) by mouth daily. Medicine to help protect your stomach lining from ulcers while on coumadin 05/13/14  Yes Rexene Alberts, MD  potassium chloride SA (K-DUR,KLOR-CON) 20 MEQ tablet Take 1 tablet (20 mEq total) by mouth daily. 05/30/13  Yes Donielle Liston Alba, PA-C  simvastatin (ZOCOR) 10 MG tablet TAKE ONE TABLET BY MOUTH EVERY EVENING. 12/10/13  Yes Tiffany L Reed, DO  tolterodine (DETROL LA) 4 MG 24 hr capsule Take 4 mg by mouth daily.   Yes Historical Provider, MD  torsemide (DEMADEX) 100 MG tablet TAKE ONE TABLET BY MOUTH EVERY MORNING 02/17/15  Yes Larey Dresser, MD  warfarin (COUMADIN) 2.5 MG tablet Take 2.5 mg by mouth daily. At 1700   Yes Historical Provider, MD  torsemide (DEMADEX) 100 MG tablet STARTING  TOMORROW, TAKE A HALF A TABLET DAILY FOR 3 DAYS AND THEN RESTART ONE TABLET DAILY THEREAFTER. Patient not taking: Reported on 06/12/2015 05/13/14   Rexene Alberts, MD    Social History:  reports that she has never smoked. She has never used smokeless tobacco. She reports that she does not drink alcohol or use illicit drugs.  Family History  Problem Relation Age of Onset  . Heart failure Father   . Heart attack Brother     [employer]   06/24/15 1538  Weight: 66.225 kg (146 lb)    All the positives are listed in BOLD  Review of Systems:  HEENT: Headache, blurred vision, runny nose, sore throat Neck: Hypothyroidism, hyperthyroidism,,lymphadenopathy Chest : Shortness of breath, history of COPD, Asthma Heart : Chest pain, history of coronary arterey disease GI:  Nausea, vomiting, diarrhea, constipation, GERD GU: Dysuria, urgency, frequency of urination, hematuria Neuro: Stroke, seizures, syncope Psych: Depression, anxiety, hallucinations   Physical Exam: Blood pressure 135/62, pulse 73, temperature 100.2 F (37.9 C), temperature source Oral, resp. rate 16, height 5\' 2"  (1.575 m), weight 66.225 kg (146 lb), SpO2 96 %. Constitutional:   Patient is  a well-developed and well-nourished  femalein no acute distress and cooperative with exam. Head: Normocephalic and atraumatic Mouth: Mucus membranes moist Eyes: PERRL, EOMI, conjunctivae normal Neck: Supple, No Thyromegaly Cardiovascular: RRR, S1 normal, S2 normal Pulmonary/Chest: clear to auscultation bilaterally  Abdominal: Soft. Non-tender, non-distended, bowel sounds are normal, no masses, organomegaly, or guarding present.  Neurological: A&O x3, Strength is normal and symmetric bilaterally, cranial nerve II-XII are grossly intact, no focal motor deficit, sensory intact to light touch bilaterally.  Extremities : No Cyanosis, Clubbing or Edema  Labs on Admission:  Basic Metabolic Panel:  Recent Labs Lab 06/24/15 1549  NA 140  K  4.0  CL 99*  CO2 31  GLUCOSE 107*  BUN 40*  CREATININE 1.91*  CALCIUM 8.8*   Liver Function Tests:  Recent Labs Lab 06/24/15 1549  AST 43*  ALT 29  ALKPHOS 107  BILITOT 1.3*  PROT 7.5  ALBUMIN 3.5   No results for input(s): LIPASE, AMYLASE in the last 168 hours. No results for input(s): AMMONIA in the last 168 hours. CBC:  Recent Labs Lab 06/24/15 1549  WBC 8.8  NEUTROABS 7.1  HGB 12.4  HCT 39.1  MCV 97.0  PLT 111*   Cardiac Enzymes: No results for input(s): CKTOTAL, CKMB, CKMBINDEX, TROPONINI in the last 168 hours.  BNP (last 3 results)  Recent Labs  11/02/14 0115  BNP 1152.0*     Radiological Exams on Admission: Dg Chest 2 View  06/24/2015  CLINICAL DATA:  Sudden onset weakness.  History of COPD. EXAM: CHEST  2 VIEW COMPARISON:  01/09/2015 FINDINGS: The heart is enlarged. Prosthetic valve. There is right upper lobe infiltrate consistent with infectious process. Chronic bibasilar changes are present. There is perihilar peribronchial thickening. IMPRESSION: 1. Right upper lobe infiltrate. 2. Followup PA and lateral chest X-ray is recommended in 3-4 weeks following trial of antibiotic therapy to ensure resolution and exclude underlying malignancy. 3. Cardiomegaly without pulmonary edema. Electronically Signed   By: Nolon Nations M.D.   On: 06/24/2015 16:31      Assessment/Plan Active Problems:   Juvenile rheumatic fever   Paroxysmal atrial fibrillation (HCC)   S/P MVR (mitral valve replacement)   Long term current use of anticoagulant therapy   Hypothyroidism   CAP (community acquired pneumonia)   Community acquired pneumonia Admit the patient under observation. Start Zithromax and Rocephin Will obtain blood cultures 2, urinary strep pneumo antigen.  Paroxysmal atrial fibrillation Chads VASC score is 4 Will obtain EKG, continue anticoagulation with Coumadin per pharmacy. Continue amiodarone We'll monitor the patient in telemetry  Status post  mitral valve replacement Stable Patient has history of rheumatic fever as a child, and has bioprosthetic mitral valve  History of congestive heart failure Chest x-ray does not show any fluid overload Patient seems to be euvolemic, will hold diuretics at this time Start gentle IV hydration. We will need to carefully monitor the I's and O's.     Code status: DO NOT INTUBATE  Family discussion: Admission, patients condition and plan of care including tests being ordered have been discussed with the patient and *her daughter at bedside who indicate understanding and agree with the plan and Code Status.   Time Spent on Admission: 60 min  [facility] Pager: [phone] 06/24/2015, 7:01 PM  If 7PM-7AM, please contact night-coverage  [email or web address]  Password [email or web address]

## 2015-06-24 NOTE — Progress Notes (Signed)
ANTICOAGULATION CONSULT NOTE - Initial Consult  Pharmacy Consult for Coumadin Indication: atrial fibrillation  Allergies  Allergen Reactions  . Indomethacin Other (See Comments)    dizziness  . Norvasc [Amlodipine Besylate] Cough    Patient Measurements: Height: 5\' 2"  (157.5 cm) Weight: 146 lb (66.225 kg) IBW/kg (Calculated) : 50.1  Vital Signs: Temp: 99.8 F (37.7 C) (04/18 2025) Temp Source: Oral (04/18 2025) BP: 128/38 mmHg (04/18 2025) Pulse Rate: 77 (04/18 2025)  Labs:  Recent Labs  06/24/15 1549  HGB 12.4  HCT 39.1  PLT 111*  LABPROT 26.6*  INR 2.49*  CREATININE 1.91*    Estimated Creatinine Clearance: 23 mL/min (by C-G formula based on Cr of 1.91).   Medical History: Past Medical History  Diagnosis Date  . COPD (chronic obstructive pulmonary disease) ([place])   . History of TIA (transient ischemic attack)     Diagnosed 2003  . History of stroke     2000 [place]  . Obesity   . Juvenile rheumatic fever     Age 43  . Essential hypertension, benign   . Hyperlipidemia   . Mitral regurgitation     Status post bioprosthetic MVR (05/2013)  . CKD (chronic kidney disease) stage 4, GFR 15-29 ml/min (HCC)   . Paroxysmal atrial fibrillation (HCC)     s/p MAZE (05/2013)  . Chronic diastolic congestive heart failure ([place])     a) ECHO (09/2013) EF 50-55%  . Arthritis   . Aortic insufficiency due to bicuspid aortic valve     Moderate by TEE  . 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 S/P Maze operation for atrial fibrillation 05/16/2013    Complete bilateral atrial lesion set using cryothermy and bipolar radiofrequency ablation with oversewing of LA appendage  . NICM (nonischemic cardiomyopathy) ([place])     a) LHC (04/2013) no significant CAD    Medications:  Prescriptions prior to admission  Medication Sig  Dispense Refill Last Dose  . amiodarone (PACERONE) 200 MG tablet Take 0.5 tablets (100 mg total) by mouth daily. 30 tablet 6 06/24/2015 at Unknown time  . colchicine 0.6 MG tablet Take 0.5 tablets (0.3 mg total) by mouth daily. 30 tablet 0 06/24/2015 at Unknown time  . ferrous sulfate 325 (65 FE) MG tablet Take 325 mg by mouth 2 (two) times daily with a meal.   06/24/2015 at Unknown time  . levothyroxine (SYNTHROID, LEVOTHROID) 50 MCG tablet Take 50 mcg by mouth daily before breakfast.   06/24/2015 at Unknown time  . metolazone (ZAROXOLYN) 2.5 MG tablet Take 1 tablet (2.5 mg total) by mouth as directed. TAKE 1 TABLET AS NEEDED ONLY IF YOUR WEIGHT INCREASES 2-3 POUNDS IN 24 HOURS. (Patient taking differently: Take 2.5 mg by mouth daily. Marland Kitchen)   06/24/2015 at Unknown time  . omeprazole (PRILOSEC) 20 MG capsule Take 1 capsule (20 mg total) by mouth daily. Medicine to help protect your stomach lining from ulcers while on coumadin 30 capsule 3 06/24/2015 at Unknown time  . potassium chloride SA (K-DUR,KLOR-CON) 20 MEQ tablet Take 1 tablet (20 mEq total) by mouth daily. 60 tablet 6 06/24/2015 at Unknown time  . simvastatin (ZOCOR) 10 MG tablet TAKE ONE TABLET BY MOUTH EVERY EVENING. 30 tablet 3 06/23/2015 at Unknown time  . tolterodine (DETROL LA) 4 MG 24 hr capsule Take 4 mg by mouth daily.   06/24/2015  at Unknown time  . torsemide (DEMADEX) 100 MG tablet TAKE ONE TABLET BY MOUTH EVERY MORNING 90 tablet 3 06/24/2015 at Unknown time  . warfarin (COUMADIN) 2.5 MG tablet Take 2.5 mg by mouth daily. At 1700   06/23/2015 at 1700  . torsemide (DEMADEX) 100 MG tablet STARTING TOMORROW, TAKE A HALF A TABLET DAILY FOR 3 DAYS AND THEN RESTART ONE TABLET DAILY THEREAFTER. (Patient not taking: Reported on 06/12/2015)   Taking    Assessment: 75 yo female presents with generalized weakness. Chest x-ray shows RUL infiltrate. Patient has history of TIA, stroke, COPD, CKD, CHF, and atrial fib. Had mitral valve replacement with  bioprothetic valve and maze procedure(05/16/2013) . Patient therapeutic INR 2.49  Goal of Therapy:  INR 2-3 Monitor platelets by anticoagulation protocol: Yes   Plan:  Continue home dose of coumadin 2.5mg  daily Daily PT/INR Monitor for S/S of bleeding  Isac Sarna, BS Vena Austria, BCPS Clinical Pharmacist Pager [phone] 06/24/2015,9:05 PM

## 2015-06-24 NOTE — ED Notes (Signed)
Pt says she is not able to get up on legs and walk.  Denies any pain or discomfort.  History of gout and using walker at home.  VSS per EMS.

## 2015-06-25 DIAGNOSIS — J189 Pneumonia, unspecified organism: Principal | ICD-10-CM

## 2015-06-25 DIAGNOSIS — N179 Acute kidney failure, unspecified: Secondary | ICD-10-CM | POA: Diagnosis present

## 2015-06-25 DIAGNOSIS — N183 Chronic kidney disease, stage 3 unspecified: Secondary | ICD-10-CM | POA: Diagnosis present

## 2015-06-25 DIAGNOSIS — J9601 Acute respiratory failure with hypoxia: Secondary | ICD-10-CM | POA: Diagnosis not present

## 2015-06-25 LAB — CBC
HEMATOCRIT: 36 % (ref 36.0–46.0)
HEMOGLOBIN: 12.1 g/dL (ref 12.0–15.0)
MCH: 32.5 pg (ref 26.0–34.0)
MCHC: 33.6 g/dL (ref 30.0–36.0)
MCV: 96.8 fL (ref 78.0–100.0)
Platelets: 96 10*3/uL — ABNORMAL LOW (ref 150–400)
RBC: 3.72 MIL/uL — ABNORMAL LOW (ref 3.87–5.11)
RDW: 14.3 % (ref 11.5–15.5)
WBC: 9.8 10*3/uL (ref 4.0–10.5)

## 2015-06-25 LAB — COMPREHENSIVE METABOLIC PANEL
ALBUMIN: 2.9 g/dL — AB (ref 3.5–5.0)
ALK PHOS: 88 U/L (ref 38–126)
ALT: 25 U/L (ref 14–54)
ANION GAP: 8 (ref 5–15)
AST: 34 U/L (ref 15–41)
BILIRUBIN TOTAL: 1.6 mg/dL — AB (ref 0.3–1.2)
BUN: 38 mg/dL — AB (ref 6–20)
CALCIUM: 8.9 mg/dL (ref 8.9–10.3)
CO2: 29 mmol/L (ref 22–32)
CREATININE: 1.56 mg/dL — AB (ref 0.44–1.00)
Chloride: 100 mmol/L — ABNORMAL LOW (ref 101–111)
GFR calc Af Amer: 37 mL/min — ABNORMAL LOW (ref >60)
GFR calc non Af Amer: 32 mL/min — ABNORMAL LOW (ref >60)
GLUCOSE: 97 mg/dL (ref 65–99)
Potassium: 3.7 mmol/L (ref 3.5–5.1)
Sodium: 137 mmol/L (ref 135–145)
TOTAL PROTEIN: 6.7 g/dL (ref 6.5–8.1)

## 2015-06-25 LAB — PROTIME-INR
INR: 2.25 — ABNORMAL HIGH (ref 0.00–1.49)
Prothrombin Time: 24.7 seconds — ABNORMAL HIGH (ref 11.6–15.2)

## 2015-06-25 MED ORDER — GUAIFENESIN ER 600 MG PO TB12
1200.0000 mg | ORAL_TABLET | Freq: Two times a day (BID) | ORAL | Status: DC
  Administered 2015-06-25 – 2015-06-27 (×5): 1200 mg via ORAL
  Filled 2015-06-25 (×5): qty 2

## 2015-06-25 MED ORDER — IPRATROPIUM-ALBUTEROL 0.5-2.5 (3) MG/3ML IN SOLN
3.0000 mL | Freq: Four times a day (QID) | RESPIRATORY_TRACT | Status: DC
  Administered 2015-06-25: 3 mL via RESPIRATORY_TRACT
  Filled 2015-06-25 (×3): qty 3

## 2015-06-25 MED ORDER — ALBUTEROL SULFATE (2.5 MG/3ML) 0.083% IN NEBU
2.5000 mg | INHALATION_SOLUTION | RESPIRATORY_TRACT | Status: DC | PRN
Start: 2015-06-25 — End: 2015-06-27

## 2015-06-25 MED ORDER — ACETAMINOPHEN 325 MG PO TABS
650.0000 mg | ORAL_TABLET | Freq: Four times a day (QID) | ORAL | Status: DC | PRN
  Administered 2015-06-25 – 2015-06-26 (×2): 650 mg via ORAL
  Filled 2015-06-25 (×2): qty 2

## 2015-06-25 NOTE — Evaluation (Signed)
Physical Therapy Evaluation Patient Details Name: [name] [name] MRN: [id number] DOB: [birth year] Today's Date: 06/25/2015   History of Present Illness  Today was brought to the hospital for generalized weakness, patient denies any symptoms. As per daughter patient was found to have generalized weakness over the past 2-3 days. Though she denies any complaints of chest pain or shortness of breath. No cough. Patient usually able to carry out her ADLs and IADLs, she lives by herself and is pretty functional..  CXR shows CAP.  PMH: COPD ; History of TIA ; History of stroke; Obesity; Juvenile rheumatic fever; Essential hypertension, benign; Hyperlipidemia; Mitral regurgitation; CKD stage 4, GFR 15-29 ml/min; Paroxysmal atrial fibrillation; Chronic diastolic congestive heart failure; Arthritis; Aortic insufficiency due to bicuspid aortic valve; S/P minimally invasive mitral valve replacement with bioprosthetic valve and maze procedure (05/16/2013); S/P Maze operation for atrial fibrillation (05/16/2013); and NICM (nonischemic cardiomyopathy  Clinical Impression  Pt received in bed, dtr present, and was agreeable to PT evaluation.  Pt required Mod A for transfer supine<>sit, and Mod A for transfer sit<>stand.  Pt was able to take a few steps from the bed<>chair with RW and Mod A due to poor step initiation.  Pt lives alone, and was receiving occasional assistance from dtr for bathing, and running errands.  she normally ambulates with a rollator.  At this time, PT recommends SNF at d/c, however after discussion with pt and dtr, that is not an option due to less than optimal previous experiences.  Therefore, pt will need HHPT, and 24/7 supervision at d/c.      Follow Up Recommendations SNF;Home health PT;Supervision/Assistance - 24 hour    Equipment Recommendations  None recommended by PT    Recommendations for Other Services       Precautions / Restrictions Precautions Precautions:  Fall Restrictions Weight Bearing Restrictions: No      Mobility  Bed Mobility Overal bed mobility: Needs Assistance Bed Mobility: Supine to Sit     Supine to sit: Mod assist;HOB elevated     General bed mobility comments: increased time, and use of bed pad to assist pt's hips to the EOB.   Transfers Overall transfer level: Needs assistance Equipment used: Rolling walker (2 wheeled) Transfers: Sit to/from Stand Sit to Stand: Min assist;Mod assist            Ambulation/Gait Ambulation/Gait assistance: Mod assist Ambulation Distance (Feet): 2 Feet Assistive device: Rolling walker (2 wheeled) Gait Pattern/deviations: Step-to pattern;Decreased stride length;Trunk flexed     General Gait Details: Pt requires 1-step commands for stepping (ie: right foot, left foot).  Pt demonstrates extremely slow cadence, but was able to take a few steps from the bed<>chair.   Stairs            Wheelchair Mobility    Modified Rankin (Stroke Patients Only)       Balance Overall balance assessment: Needs assistance Sitting-balance support: Bilateral upper extremity supported Sitting balance-[name] Scale: Fair     Standing balance support: Bilateral upper extremity supported Standing balance-[name] Scale: Fair                               Pertinent Vitals/Pain Pain Assessment: No/denies pain    Home Living Family/patient expects to be discharged to:: Private residence Living Arrangements: Alone Available Help at Discharge: Family (Dtr and granddaughter assist with driving pt to groceries. ) Type of Home: House Home Access: Ramped entrance  Home Layout: One level Home Equipment: Walker - 2 wheels;Walker - 4 wheels;Hospital bed;Other (comment);Bedside commode (O2)      Prior Function     Gait / Transfers Assistance Needed: Pt normally uses a rollator.   ADL's / Homemaking Assistance Needed: Pt states she normally takes a sponge bath, but if she gets  in the shower, the dtr is there to assist.  Pt is able to get herself dressed.         Hand Dominance        Extremity/Trunk Assessment                         Communication      Cognition Arousal/Alertness: Awake/alert Behavior During Therapy: WFL for tasks assessed/performed Overall Cognitive Status: Impaired/Different from baseline Area of Impairment: Safety/judgement;Memory     Memory: Decreased recall of precautions;Decreased short-term memory (pt states that she uses a cane for gait, when she uses a RW. )   Safety/Judgement: Decreased awareness of safety;Decreased awareness of deficits          General Comments      Exercises        Assessment/Plan    PT Assessment Patient needs continued PT services  PT Diagnosis Difficulty walking;Generalized weakness   PT Problem List Decreased strength;Decreased activity tolerance;Decreased balance;Decreased mobility;Decreased cognition;Decreased knowledge of use of DME;Decreased safety awareness;Decreased knowledge of precautions;Cardiopulmonary status limiting activity  PT Treatment Interventions Gait training;DME instruction;Functional mobility training;Therapeutic activities;Therapeutic exercise;Balance training;Cognitive remediation;Patient/family education   PT Goals (Current goals can be found in the Care Plan section) Acute Rehab PT Goals Patient Stated Goal: To go back home PT Goal Formulation: With patient/family Time For Goal Achievement: 07/09/15 Potential to Achieve Goals: Fair    Frequency Min 5X/week   Barriers to discharge        Co-evaluation               End of Session Equipment Utilized During Treatment: Gait belt Activity Tolerance: Patient limited by fatigue Patient left: in chair;with family/visitor present;with call bell/phone within reach      Functional Assessment Tool Used: Clinical Judgement Functional Limitation: Mobility: Walking and moving around Mobility:  Walking and Moving Around Current Status [phone]): At least 40 percent but less than 60 percent impaired, limited or restricted Mobility: Walking and Moving Around Goal Status [phone]): At least 20 percent but less than 40 percent impaired, limited or restricted    Time: [id number] PT Time Calculation (min) (ACUTE ONLY): 32 min   Charges:   PT Evaluation $PT Eval Moderate Complexity: 1 Procedure PT Treatments $Therapeutic Activity: 8-22 mins   PT G Codes:   PT G-Codes **NOT FOR INPATIENT CLASS** Functional Assessment Tool Used: Clinical Judgement Functional Limitation: Mobility: Walking and moving around Mobility: Walking and Moving Around Current Status [id number]): At least 40 percent but less than 60 percent impaired, limited or restricted Mobility: Walking and Moving Around Goal Status [phone]): At least 20 percent but less than 40 percent impaired, limited or restricted    Tacy Learn, PT, DPT X: [id number]   06/25/2015, 3:54 PM

## 2015-06-25 NOTE — Care Management Note (Signed)
Case Management Note  Patient Details  Name: [name] [name] MRN: [id number] Date of Birth: May 07, 1940  Subjective/Objective:     Patient is from home alert and oriented but very weak as of late. Has daughter that lives in the community and can assist. Does not drive. Has lift chair, walker, and rollator. No difficulty with medications . Has home O2 but cannot remember the provider.  PT evaluation placed due to new complaint of inability to walk due to weakness.             Action/Plan: Home with Home Health awaiting PT evaluation.    Expected Discharge Date:                  Expected Discharge Plan:  [place]  In-House Referral:     Discharge planning Services  CM Consult  Post Acute Care Choice:    Choice offered to:     DME Arranged:    DME Agency:     HH Arranged:    [place] Agency:     Status of Service:  In process, will continue to follow  Medicare Important Message Given:    Date Medicare IM Given:    Medicare IM give by:    Date Additional Medicare IM Given:    Additional Medicare Important Message give by:     If discussed at [place] of Stay Meetings, dates discussed:    Additional Comments:  Alvie Heidelberg, RN 06/25/2015, 10:25 AM

## 2015-06-25 NOTE — Progress Notes (Signed)
PROGRESS NOTE    [name] [name]  [id number] DOB: 12/25/1940 DOA: 06/24/2015 PCP: [name], MD   Outpatient Specialists: Cardiology-Dr. Harl Bowie   Brief Narrative: 57 yof with past medical history of COPD, Essential HTN, HLD, chronic diastolic heart failure and aortic insufficiency due to bicuspid valve aoritic valve s/p minimally invasive mitral valve replacement with bioprosthetic valve and maze procedure (05/16/2013); S/P Maze operation for atrial fibrillation (05/16/2013); and NICM. Presented with complaints of generalized weakness. While in the ED chest x-ray showed right upper lobe infiltrate. Patient started on Rocephin and Zithromax and admitted for further treatment    Assessment & Plan: 1. CAP, revealed on CXR. Started on IV Zithromax and Rocephin. BC in process. Noted to have intermittent fevers. WBC WNL. Gentle hydration in the setting of chronic CHF.  Continue IV abx for now. Encourage pulmonary hygiene  2. Acute respiratory failure with hypoxia. Related to pneumonia. Wean oxygen as tolerated.  3. AKI on CKD Stage III. Related to dehydration. Creatinine improving with hydration.  4. Paroxysmal atrial fibrillation, CHADS VACS score 4. Anticoagulated on Warfarin. Currently in SR.  5. Thrombocytopenia. platelet 96. Likely related to infectious process.Continue to monitor.  6. S/p mitral valve replacement. Stable. Patient has history of rheumatic fever as a child, and has bioprosthetic mitral valve 7. Hypothyroidism, continue synthroid.  8. Chronic diastolic congestive heart failure. Appears compensated.     Active Problems:   Juvenile rheumatic fever   Paroxysmal atrial fibrillation (HCC)   S/P MVR (mitral valve replacement)   Long term current use of anticoagulant therapy   Hypothyroidism   CAP (community acquired pneumonia)   DVT prophylaxis: Warfarin  Code Status: DNR  Family Communication: Daughter bedside.  Disposition Plan: Anticipate discharge in 1-2  days.   Consultants:   None  Procedures:   None  Antimicrobials:   Zithromax 4/18  Rocephin 4/18>>   Subjective: Feeling pretty good. Still has a mild nonproductive cough   Objective: Filed Vitals:   06/24/15 1929 06/24/15 1930 06/24/15 2025 06/25/15 0554  BP: 139/52 139/52 128/38 141/52  Pulse: 78 78 77 78  Temp: 101.8 F (38.8 C)  99.8 F (37.7 C) 101.6 F (38.7 C)  TempSrc: Rectal  Oral Oral  Resp: 20 20 20 20   Height:      Weight:      SpO2: 92%  97% 93%   No intake or output data in the 24 hours ending 06/25/15 1146 Filed Weights   06/24/15 1538  Weight: 66.225 kg (146 lb)    Examination:  General exam: Appears calm and comfortable  Respiratory system: Rhonchi bilaterally Cardiovascular system: RRR. No JVD, murmurs, rubs, gallops or clicks. No pedal edema. Gastrointestinal system: Abdomen is nondistended, soft and nontender. No organomegaly or masses felt. Normal bowel sounds heard. Central nervous system: Alert and oriented. No focal neurological deficits. Extremities: Symmetric 5 x 5 power. Skin: No rashes, lesions or ulcers Psychiatry: Judgement and insight appear normal. Mood & affect appropriate.     Data Reviewed: I have personally reviewed following labs and imaging studies  CBC:  Recent Labs Lab 06/24/15 1549 06/25/15 0541  WBC 8.8 9.8  NEUTROABS 7.1  --   HGB 12.4 12.1  HCT 39.1 36.0  MCV 97.0 96.8  PLT 111* 96*   Basic Metabolic Panel:  Recent Labs Lab 06/24/15 1549 06/25/15 0541  NA 140 137  K 4.0 3.7  CL 99* 100*  CO2 31 29  GLUCOSE 107* 97  BUN 40* 38*  CREATININE 1.91* 1.56*  CALCIUM 8.8* 8.9   Liver Function Tests:  Recent Labs Lab 06/24/15 1549 06/25/15 0541  AST 43* 34  ALT 29 25  ALKPHOS 107 88  BILITOT 1.3* 1.6*  PROT 7.5 6.7  ALBUMIN 3.5 2.9*  Coagulation Profile:  Recent Labs Lab 06/24/15 1549 06/25/15 0541  INR 2.49* 2.25*   Urine analysis:    Component Value Date/Time   COLORURINE  YELLOW 06/24/2015 1828   APPEARANCEUR CLEAR 06/24/2015 1828   LABSPEC <1.005* 06/24/2015 1828   PHURINE 5.5 06/24/2015 1828   GLUCOSEU NEGATIVE 06/24/2015 1828   HGBUR TRACE* 06/24/2015 1828   BILIRUBINUR NEGATIVE 06/24/2015 1828   KETONESUR NEGATIVE 06/24/2015 1828   PROTEINUR NEGATIVE 06/24/2015 1828   UROBILINOGEN 0.2 05/09/2014 1455   NITRITE NEGATIVE 06/24/2015 1828   LEUKOCYTESUR NEGATIVE 06/24/2015 1828   Sepsis Labs: @LABRCNTIP (procalcitonin:4,lacticidven:4)  ) Recent Results (from the past 240 hour(s))  Culture, blood (Routine X 2) w Reflex to ID Panel     Status: None (Preliminary result)   Collection Time: 06/24/15  7:20 PM  Result Value Ref Range Status   Specimen Description LEFT ANTECUBITAL  Final   Special Requests BOTTLES DRAWN AEROBIC AND ANAEROBIC 6CC  Final   Culture PENDING  Incomplete   Report Status PENDING  Incomplete  Culture, blood (Routine X 2) w Reflex to ID Panel     Status: None (Preliminary result)   Collection Time: 06/24/15  7:26 PM  Result Value Ref Range Status   Specimen Description BLOOD RIGHT HAND  Final   Special Requests BOTTLES DRAWN AEROBIC AND ANAEROBIC [facility]  Final   Culture PENDING  Incomplete   Report Status PENDING  Incomplete         Radiology Studies: Dg Chest 2 View  06/24/2015  CLINICAL DATA:  Sudden onset weakness.  History of COPD. EXAM: CHEST  2 VIEW COMPARISON:  01/09/2015 FINDINGS: The heart is enlarged. Prosthetic valve. There is right upper lobe infiltrate consistent with infectious process. Chronic bibasilar changes are present. There is perihilar peribronchial thickening. IMPRESSION: 1. Right upper lobe infiltrate. 2. Followup PA and lateral chest X-ray is recommended in 3-4 weeks following trial of antibiotic therapy to ensure resolution and exclude underlying malignancy. 3. Cardiomegaly without pulmonary edema. Electronically Signed   By: Nolon Nations M.D.   On: 06/24/2015 16:31     Scheduled Meds: .  amiodarone  100 mg Oral Daily  . azithromycin  500 mg Intravenous Q24H  . cefTRIAXone (ROCEPHIN)  IV  1 g Intravenous Q24H  . colchicine  0.3 mg Oral Daily  . fesoterodine  8 mg Oral Daily  . levothyroxine  50 mcg Oral QAC breakfast  . pantoprazole  40 mg Oral Daily  . potassium chloride SA  20 mEq Oral Daily  . simvastatin  10 mg Oral q1800  . warfarin  2.5 mg Oral Q supper  . Warfarin - Pharmacist Dosing Inpatient   Does not apply q1800   Continuous Infusions: . sodium chloride 50 mL/hr at 06/24/15 2140     LOS: 1 day    Time spent: 25 minutes   Kathie Dike, MD  [facility] Pager [phone]  If 7PM-7AM, please contact night-coverage [email or web address] Password [email or web address] 06/25/2015, 11:46 AM    By signing my name below, I, Rennis Harding, attest that this documentation has been prepared under the direction and in the presence of Kathie Dike, MD. Electronically signed: Rennis Harding, Scribe. 06/25/2015 1:22pm   I, Dr. Kathie Dike, personally performed the services described in this documentaiton.  All medical record entries made by the scribe were at my direction and in my presence. I have reviewed the chart and agree that the record reflects my personal performance and is accurate and complete  Kathie Dike, MD, 06/25/2015 1:39 PM

## 2015-06-25 NOTE — Care Management Obs Status (Signed)
[facility] NOTIFICATION   Patient Details  Name: [name] [name] MRN: [id number] Date of Birth: 08/09/1940   Medicare Observation Status Notification Given:  Yes    Alvie Heidelberg, RN 06/25/2015, 12:43 PM

## 2015-06-25 NOTE — Progress Notes (Signed)
ANTICOAGULATION CONSULT NOTE - Initial Consult  Pharmacy Consult for Coumadin Indication: atrial fibrillation  Allergies  Allergen Reactions  . Indomethacin Other (See Comments)    dizziness  . Norvasc [Amlodipine Besylate] Cough    Patient Measurements: Height: 5\' 2"  (157.5 cm) Weight: 146 lb (66.225 kg) IBW/kg (Calculated) : 50.1  Vital Signs: Temp: 101.6 F (38.7 C) (04/19 0554) Temp Source: Oral (04/19 0554) BP: 141/52 mmHg (04/19 0554) Pulse Rate: 78 (04/19 0554)  Labs:  Recent Labs  06/24/15 1549 06/25/15 0541  HGB 12.4 12.1  HCT 39.1 36.0  PLT 111* 96*  LABPROT 26.6* 24.7*  INR 2.49* 2.25*  CREATININE 1.91* 1.56*    Estimated Creatinine Clearance: 28.2 mL/min (by C-G formula based on Cr of 1.56).   Medical History: Past Medical History  Diagnosis Date  . COPD (chronic obstructive pulmonary disease) ([place])   . History of TIA (transient ischemic attack)     Diagnosed 2003  . History of stroke     2000 [place]  . Obesity   . Juvenile rheumatic fever     Age 85  . Essential hypertension, benign   . Hyperlipidemia   . Mitral regurgitation     Status post bioprosthetic MVR (05/2013)  . CKD (chronic kidney disease) stage 4, GFR 15-29 ml/min (HCC)   . Paroxysmal atrial fibrillation (HCC)     s/p MAZE (05/2013)  . Chronic diastolic congestive heart failure ([place])     a) ECHO (09/2013) EF 50-55%  . Arthritis   . Aortic insufficiency due to bicuspid aortic valve     Moderate by TEE  . 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 S/P Maze operation for atrial fibrillation 05/16/2013    Complete bilateral atrial lesion set using cryothermy and bipolar radiofrequency ablation with oversewing of LA appendage  . NICM (nonischemic cardiomyopathy) ([place])     a) LHC (04/2013) no significant CAD    Medications:   Prescriptions prior to admission  Medication Sig Dispense Refill Last Dose  . amiodarone (PACERONE) 200 MG tablet Take 0.5 tablets (100 mg total) by mouth daily. 30 tablet 6 06/24/2015 at Unknown time  . colchicine 0.6 MG tablet Take 0.5 tablets (0.3 mg total) by mouth daily. 30 tablet 0 06/24/2015 at Unknown time  . ferrous sulfate 325 (65 FE) MG tablet Take 325 mg by mouth 2 (two) times daily with a meal.   06/24/2015 at Unknown time  . levothyroxine (SYNTHROID, LEVOTHROID) 50 MCG tablet Take 50 mcg by mouth daily before breakfast.   06/24/2015 at Unknown time  . metolazone (ZAROXOLYN) 2.5 MG tablet Take 1 tablet (2.5 mg total) by mouth as directed. TAKE 1 TABLET AS NEEDED ONLY IF YOUR WEIGHT INCREASES 2-3 POUNDS IN 24 HOURS. (Patient taking differently: Take 2.5 mg by mouth daily. Marland Kitchen)   06/24/2015 at Unknown time  . omeprazole (PRILOSEC) 20 MG capsule Take 1 capsule (20 mg total) by mouth daily. Medicine to help protect your stomach lining from ulcers while on coumadin 30 capsule 3 06/24/2015 at Unknown time  . potassium chloride SA (K-DUR,KLOR-CON) 20 MEQ tablet Take 1 tablet (20 mEq total) by mouth daily. 60 tablet 6 06/24/2015 at Unknown time  . simvastatin (ZOCOR) 10 MG tablet TAKE ONE TABLET BY MOUTH EVERY EVENING. 30 tablet 3 06/23/2015 at Unknown time  . tolterodine (DETROL LA) 4 MG 24 hr capsule Take  4 mg by mouth daily.   06/24/2015 at Unknown time  . torsemide (DEMADEX) 100 MG tablet TAKE ONE TABLET BY MOUTH EVERY MORNING 90 tablet 3 06/24/2015 at Unknown time  . warfarin (COUMADIN) 2.5 MG tablet Take 2.5 mg by mouth daily. At 1700   06/23/2015 at 1700  . torsemide (DEMADEX) 100 MG tablet STARTING TOMORROW, TAKE A HALF A TABLET DAILY FOR 3 DAYS AND THEN RESTART ONE TABLET DAILY THEREAFTER. (Patient not taking: Reported on 06/12/2015)   Taking    Assessment: 75 yo female presents with generalized weakness. Chest x-ray shows RUL infiltrate. Patient has history of TIA, stroke, COPD, CKD, CHF, and  atrial fib. Had mitral valve replacement with bioprothetic valve and maze procedure(05/16/2013) . Patient therapeutic INR 2.25 today, continue home dose.   Goal of Therapy:  INR 2-3 Monitor platelets by anticoagulation protocol: Yes   Plan:  Continue home dose of coumadin 2.5mg  daily Daily PT/INR Monitor for S/S of bleeding  Isac Sarna, BS Vena [place], BCPS Clinical Pharmacist Pager [phone] 06/25/2015,12:21 PM

## 2015-06-26 DIAGNOSIS — E038 Other specified hypothyroidism: Secondary | ICD-10-CM

## 2015-06-26 DIAGNOSIS — N179 Acute kidney failure, unspecified: Secondary | ICD-10-CM | POA: Diagnosis not present

## 2015-06-26 DIAGNOSIS — I480 Paroxysmal atrial fibrillation: Secondary | ICD-10-CM

## 2015-06-26 DIAGNOSIS — N183 Chronic kidney disease, stage 3 (moderate): Secondary | ICD-10-CM | POA: Diagnosis not present

## 2015-06-26 DIAGNOSIS — J189 Pneumonia, unspecified organism: Secondary | ICD-10-CM | POA: Diagnosis not present

## 2015-06-26 DIAGNOSIS — J9601 Acute respiratory failure with hypoxia: Secondary | ICD-10-CM | POA: Diagnosis not present

## 2015-06-26 LAB — CBC
HCT: 33.5 % — ABNORMAL LOW (ref 36.0–46.0)
Hemoglobin: 11 g/dL — ABNORMAL LOW (ref 12.0–15.0)
MCH: 31.9 pg (ref 26.0–34.0)
MCHC: 32.8 g/dL (ref 30.0–36.0)
MCV: 97.1 fL (ref 78.0–100.0)
PLATELETS: 86 10*3/uL — AB (ref 150–400)
RBC: 3.45 MIL/uL — AB (ref 3.87–5.11)
RDW: 14.4 % (ref 11.5–15.5)
WBC: 8.8 10*3/uL (ref 4.0–10.5)

## 2015-06-26 LAB — BASIC METABOLIC PANEL
Anion gap: 9 (ref 5–15)
BUN: 36 mg/dL — AB (ref 6–20)
CALCIUM: 8.9 mg/dL (ref 8.9–10.3)
CO2: 27 mmol/L (ref 22–32)
CREATININE: 1.66 mg/dL — AB (ref 0.44–1.00)
Chloride: 101 mmol/L (ref 101–111)
GFR calc Af Amer: 34 mL/min — ABNORMAL LOW (ref >60)
GFR, EST NON AFRICAN AMERICAN: 29 mL/min — AB (ref >60)
Glucose, Bld: 93 mg/dL (ref 65–99)
POTASSIUM: 3.9 mmol/L (ref 3.5–5.1)
SODIUM: 137 mmol/L (ref 135–145)

## 2015-06-26 LAB — PROTIME-INR
INR: 2.98 — ABNORMAL HIGH (ref 0.00–1.49)
PROTHROMBIN TIME: 30.4 s — AB (ref 11.6–15.2)

## 2015-06-26 LAB — STREP PNEUMONIAE URINARY ANTIGEN: STREP PNEUMO URINARY ANTIGEN: NEGATIVE

## 2015-06-26 MED ORDER — IPRATROPIUM-ALBUTEROL 0.5-2.5 (3) MG/3ML IN SOLN
3.0000 mL | Freq: Three times a day (TID) | RESPIRATORY_TRACT | Status: DC
  Administered 2015-06-26 – 2015-06-27 (×4): 3 mL via RESPIRATORY_TRACT
  Filled 2015-06-26 (×4): qty 3

## 2015-06-26 NOTE — Progress Notes (Signed)
Physical Therapy Treatment Patient Details Name: [name] [name] MRN: [id number] DOB: December 26, 1940 Today's Date: 06/26/2015    History of Present Illness Today was brought to the hospital for generalized weakness, patient denies any symptoms. As per daughter patient was found to have generalized weakness over the past 2-3 days. Though she denies any complaints of chest pain or shortness of breath. No cough. Patient usually able to carry out her ADLs and IADLs, she lives by herself and is pretty functional..  CXR shows CAP.  PMH: COPD ; History of TIA ; History of stroke; Obesity; Juvenile rheumatic fever; Essential hypertension, benign; Hyperlipidemia; Mitral regurgitation; CKD stage 4, GFR 15-29 ml/min; Paroxysmal atrial fibrillation; Chronic diastolic congestive heart failure; Arthritis; Aortic insufficiency due to bicuspid aortic valve; S/P minimally invasive mitral valve replacement with bioprosthetic valve and maze procedure (05/16/2013); S/P Maze operation for atrial fibrillation (05/16/2013); and NICM (nonischemic cardiomyopathy    PT Comments    Pt sitting in chair upon therapist entrance.  Pt pleasant and eager to participate with therapy.  Pt presents with improved mobility and increased activity tolerance with ability to complete sit to stand and increased distance with gait training this session.  Upon standing noted bowel movement in diaper daughter had supplied.  Ambulated to restroom and aide assisted with cleaning.  Pt able to stand stable with RW during cleaning.  Gait training with RW with increased distance, pt demonstrated extremely slow cadence though stable with UE A no LOB episodes during session.  Pt left in chair with call bell within reach, chair alarm set and staff for breathing treatment.  No reports of pain through session.    Follow Up Recommendations        Equipment Recommendations       Recommendations for Other Services       Precautions / Restrictions  Precautions Precautions: Fall Restrictions Weight Bearing Restrictions: No    Mobility  Bed Mobility                  Transfers Overall transfer level: Needs assistance Equipment used: Rolling walker (2 wheeled) Transfers: Sit to/from Stand Sit to Stand: Min assist         General transfer comment: Cueing for hand placement to sit to stand  Ambulation/Gait Ambulation/Gait assistance: Min assist Ambulation Distance (Feet): 36 Feet Assistive device: Rolling walker (2 wheeled) Gait Pattern/deviations: Step-through pattern;Decreased stride length;Trunk flexed   Gait velocity interpretation: Below normal speed for age/gender General Gait Details: Demonstrates extremely slow cadence though stable with RW assistabce   Stairs            Wheelchair Mobility    Modified Rankin (Stroke Patients Only)       Balance                                    Cognition Arousal/Alertness: Awake/alert Behavior During Therapy: WFL for tasks assessed/performed Overall Cognitive Status: Impaired/Different from baseline Area of Impairment: Safety/judgement;Memory     Memory: Decreased recall of precautions;Decreased short-term memory   Safety/Judgement: Decreased awareness of safety;Decreased awareness of deficits          Exercises Total Joint Exercises Ankle Circles/Pumps: AROM;Both;10 reps;Seated Long Arc Quad: AROM;Both;10 reps;Seated    General Comments        Pertinent Vitals/Pain Pain Assessment: No/denies pain    Home Living  Prior Function            PT Goals (current goals can now be found in the care plan section) Acute Rehab PT Goals Patient Stated Goal: To go back home PT Goal Formulation: With patient/family Progress towards PT goals: Progressing toward goals    Frequency       PT Plan Current plan remains appropriate    Co-evaluation             End of Session Equipment Utilized  During Treatment: Gait belt Activity Tolerance: Patient limited by fatigue Patient left: in chair;with call bell/phone within reach;with chair alarm set;with nursing/sitter in room     Time: 1348-1420 PT Time Calculation (min) (ACUTE ONLY): 32 min  Charges:  $Gait Training: 8-22 mins $Therapeutic Activity: 8-22 mins                    G Codes:      Aldona Lento 06/26/2015, 2:53 PM

## 2015-06-26 NOTE — Progress Notes (Signed)
[place] for Coumadin Indication: atrial fibrillation  Allergies  Allergen Reactions  . Indomethacin Other (See Comments)    dizziness  . Norvasc [Amlodipine Besylate] Cough    Patient Measurements: Height: 5\' 2"  (157.5 cm) Weight: 146 lb (66.225 kg) IBW/kg (Calculated) : 50.1  Vital Signs: Temp: 99.3 F (37.4 C) (04/20 0500) Temp Source: Oral (04/20 0500) BP: 109/88 mmHg (04/20 0500) Pulse Rate: 88 (04/20 0500)  Labs:  Recent Labs  06/24/15 1549 06/25/15 0541 06/26/15 0636  HGB 12.4 12.1 11.0*  HCT 39.1 36.0 33.5*  PLT 111* 96* 86*  LABPROT 26.6* 24.7* 30.4*  INR 2.49* 2.25* 2.98*  CREATININE 1.91* 1.56* 1.66*    Estimated Creatinine Clearance: 26.5 mL/min (by C-G formula based on Cr of 1.66).   Medical History: Past Medical History  Diagnosis Date  . COPD (chronic obstructive pulmonary disease) ([place])   . History of TIA (transient ischemic attack)     Diagnosed 2003  . History of stroke     2000 [place]  . Obesity   . Juvenile rheumatic fever     Age 58  . Essential hypertension, benign   . Hyperlipidemia   . Mitral regurgitation     Status post bioprosthetic MVR (05/2013)  . CKD (chronic kidney disease) stage 4, GFR 15-29 ml/min (HCC)   . Paroxysmal atrial fibrillation (HCC)     s/p MAZE (05/2013)  . Chronic diastolic congestive heart failure ([place])     a) ECHO (09/2013) EF 50-55%  . Arthritis   . Aortic insufficiency due to bicuspid aortic valve     Moderate by TEE  . 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 S/P Maze operation for atrial fibrillation 05/16/2013    Complete bilateral atrial lesion set using cryothermy and bipolar radiofrequency ablation with oversewing of LA appendage  . NICM (nonischemic cardiomyopathy) ([place])     a) LHC (04/2013) no significant  CAD    Medications:  Prescriptions prior to admission  Medication Sig Dispense Refill Last Dose  . amiodarone (PACERONE) 200 MG tablet Take 0.5 tablets (100 mg total) by mouth daily. 30 tablet 6 06/24/2015 at Unknown time  . colchicine 0.6 MG tablet Take 0.5 tablets (0.3 mg total) by mouth daily. 30 tablet 0 06/24/2015 at Unknown time  . ferrous sulfate 325 (65 FE) MG tablet Take 325 mg by mouth 2 (two) times daily with a meal.   06/24/2015 at Unknown time  . levothyroxine (SYNTHROID, LEVOTHROID) 50 MCG tablet Take 50 mcg by mouth daily before breakfast.   06/24/2015 at Unknown time  . metolazone (ZAROXOLYN) 2.5 MG tablet Take 1 tablet (2.5 mg total) by mouth as directed. TAKE 1 TABLET AS NEEDED ONLY IF YOUR WEIGHT INCREASES 2-3 POUNDS IN 24 HOURS. (Patient taking differently: Take 2.5 mg by mouth daily. Marland Kitchen)   06/24/2015 at Unknown time  . omeprazole (PRILOSEC) 20 MG capsule Take 1 capsule (20 mg total) by mouth daily. Medicine to help protect your stomach lining from ulcers while on coumadin 30 capsule 3 06/24/2015 at Unknown time  . potassium chloride SA (K-DUR,KLOR-CON) 20 MEQ tablet Take 1 tablet (20 mEq total) by mouth daily. 60 tablet 6 06/24/2015 at Unknown time  . simvastatin (ZOCOR) 10 MG tablet TAKE ONE TABLET BY MOUTH EVERY EVENING. 30 tablet 3 06/23/2015 at Unknown time  . tolterodine (DETROL LA)  4 MG 24 hr capsule Take 4 mg by mouth daily.   06/24/2015 at Unknown time  . torsemide (DEMADEX) 100 MG tablet TAKE ONE TABLET BY MOUTH EVERY MORNING 90 tablet 3 06/24/2015 at Unknown time  . warfarin (COUMADIN) 2.5 MG tablet Take 2.5 mg by mouth daily. At 1700   06/23/2015 at 1700  . torsemide (DEMADEX) 100 MG tablet STARTING TOMORROW, TAKE A HALF A TABLET DAILY FOR 3 DAYS AND THEN RESTART ONE TABLET DAILY THEREAFTER. (Patient not taking: Reported on 06/12/2015)   Taking    Assessment: 75 yo female presents with generalized weakness. Chest x-ray shows RUL infiltrate. Patient has history of TIA, stroke,  COPD, CKD, CHF, and atrial fib. Had mitral valve replacement with bioprothetic valve and maze procedure(05/16/2013) . Patient therapeutic INR 2.98 today, continue home dose.  Increase in INR maybe related to azithromycin, continue to monitor. Hgb of 11 maybe dilutional, f/u and trend.   Goal of Therapy:  INR 2-3 Monitor platelets by anticoagulation protocol: Yes   Plan:  Continue home dose of coumadin 2.5mg  daily Daily PT/INR Monitor for S/S of bleeding  Isac Sarna, BS Vena Austria, BCPS Clinical Pharmacist Pager [phone] 06/26/2015,8:24 AM

## 2015-06-26 NOTE — Consult Note (Signed)
   [facility] Inpatient Consult   06/26/2015  MERSAYDES OBST November 06, 1940 [id number]   Spoke with patient at bedside regarding the restart of services with [facility]. Patient verbalized  interest in [facility] services. Patient signed consent form on file. Patient will receive post hospital follow up calls and be assessed for home visits.  Of note, [facility] Care Management services does not replace or interfere with any services that are arranged by inpatient case management or social work.  For questions, please contact:  Royetta Crochet. Laymond Purser, RN, BSN, [place] [phone]) Business Cell  [phone]) Toll Free Office

## 2015-06-26 NOTE — Care Management Note (Signed)
Case Management Note  Patient Details  Name: [name] [name] MRN: [id number] Date of Birth: 1940-07-12  Subjective/Objective: Spoke with patient again for continued assessment. Patient does not want to go to SNF for [facility]. Wants to go home with home health. Patient has been open to [facility] prior and would like to continue.  Will need RN and PT  . Patient stated that she is ready to go home.  Referral placed with [facility].             Action/Plan:Hme with Home Health.   Expected Discharge Date:                  Expected Discharge Plan:  [place]  In-House Referral:     Discharge planning Services  CM Consult  Post Acute Care Choice:    Choice offered to:  Patient  DME Arranged:    DME Agency:     HH Arranged:  RN, PT [place] Agency:  [place]  Status of Service:  In process, will continue to follow  Medicare Important Message Given:    Date Medicare IM Given:    Medicare IM give by:    Date Additional Medicare IM Given:    Additional Medicare Important Message give by:     If discussed at [place] of Stay Meetings, dates discussed:    Additional Comments:  Alvie Heidelberg, RN 06/26/2015, 4:29 PM

## 2015-06-26 NOTE — Progress Notes (Signed)
PROGRESS NOTE    [name] [name]  [id number] DOB: 1940/06/15 DOA: 06/24/2015 PCP: [name], MD   Outpatient Specialists: Cardiology-Dr. Harl Bowie   Brief Narrative: 32 yof with past medical history of COPD, Essential HTN, HLD, chronic diastolic heart failure and aortic insufficiency due to bicuspid valve aoritic valve s/p minimally invasive mitral valve replacement with bioprosthetic valve and maze procedure (05/16/2013); S/P Maze operation for atrial fibrillation (05/16/2013); and NICM. Presented with complaints of generalized weakness. While in the ED chest x-ray showed right upper lobe infiltrate. Patient started on Rocephin and Zithromax and admitted for further treatment    Assessment & Plan: 1. CAP, revealed on CXR. Continue IV Zithromax and Rocephin. BC in process. Fever curve improving. WBC WNL.  Continue IV abx for now with possible transition to po abx in am. Encourage pulmonary hygiene  2. Acute respiratory failure with hypoxia. Related to pneumonia. Wean oxygen as tolerated.  3. AKI on CKD Stage III. Related to dehydration. Creatinine improving with hydration, 1.66 today.  4. Paroxysmal atrial fibrillation, CHADS VASC score 4. Anticoagulated on Warfarin. Currently in SR.  5. Thrombocytopenia. platelet 86. Likely related to infectious process. Continue to monitor.  6. S/p mitral valve replacement. Stable. Patient has history of rheumatic fever as a child, and has bioprosthetic mitral valve 7. Hypothyroidism, continue synthroid.  8. Chronic diastolic congestive heart failure. Appears compensated.     Active Problems:   Juvenile rheumatic fever   Paroxysmal atrial fibrillation (HCC)   S/P MVR (mitral valve replacement)   Long term current use of anticoagulant therapy   Acute respiratory failure with hypoxia (HCC)   Hypothyroidism   CAP (community acquired pneumonia)   CKD (chronic kidney disease) stage 3, GFR 30-59 ml/min   AKI (acute kidney injury) ([place])   DVT  prophylaxis: Warfarin  Code Status: partial code, no intubation Family Communication: Family at bedisde Disposition Plan: Anticipate discharge in 24 hrs.   Consultants:   None  Procedures:   None  Antimicrobials:   Zithromax 4/18  Rocephin 4/18>>   Subjective: Feeling better. Reports continued cough. Overall shortness of breath is improving.   Objective: Filed Vitals:   06/25/15 1545 06/25/15 2026 06/25/15 2211 06/26/15 0500  BP:   120/56 109/88  Pulse:  84 74 88  Temp: 99.9 F (37.7 C)  98.2 F (36.8 C) 99.3 F (37.4 C)  TempSrc: Oral  Oral Oral  Resp:  16 20 18   Height:      Weight:      SpO2:  94% 97% 92%    Intake/Output Summary (Last 24 hours) at 06/26/15 0754 Last data filed at 06/25/15 1700  Gross per 24 hour  Intake 1553.33 ml  Output    450 ml  Net 1103.33 ml   Filed Weights   06/24/15 1538  Weight: 66.225 kg (146 lb)    Examination:   General exam: Appears calm and comfortable  Respiratory system: scattered rhonchi. Respiratory effort normal. Cardiovascular system: S1 & S2 heard, RRR. No JVD, murmurs, rubs, gallops or clicks. No pedal edema. Gastrointestinal system: Abdomen is nondistended, soft and nontender. No organomegaly or masses felt. Normal bowel sounds heard. Central nervous system: Alert and oriented. No focal neurological deficits. Extremities: Symmetric 5 x 5 power. Skin: venous stasis changes in LE bilaterally Psychiatry: Judgement and insight appear normal. Mood & affect appropriate.     Data Reviewed: I have personally reviewed following labs and imaging studies  CBC:  Recent Labs Lab 06/24/15 1549 06/25/15 0541 06/26/15 0636  WBC 8.8 9.8 8.8  NEUTROABS 7.1  --   --   HGB 12.4 12.1 11.0*  HCT 39.1 36.0 33.5*  MCV 97.0 96.8 97.1  PLT 111* 96* 86*   Basic Metabolic Panel:  Recent Labs Lab 06/24/15 1549 06/25/15 0541 06/26/15 0636  NA 140 137 137  K 4.0 3.7 3.9  CL 99* 100* 101  CO2 31 29 27   GLUCOSE  107* 97 93  BUN 40* 38* 36*  CREATININE 1.91* 1.56* 1.66*  CALCIUM 8.8* 8.9 8.9   Liver Function Tests:  Recent Labs Lab 06/24/15 1549 06/25/15 0541  AST 43* 34  ALT 29 25  ALKPHOS 107 88  BILITOT 1.3* 1.6*  PROT 7.5 6.7  ALBUMIN 3.5 2.9*  Coagulation Profile:  Recent Labs Lab 06/24/15 1549 06/25/15 0541 06/26/15 0636  INR 2.49* 2.25* 2.98*   Urine analysis:    Component Value Date/Time   COLORURINE YELLOW 06/24/2015 1828   APPEARANCEUR CLEAR 06/24/2015 1828   LABSPEC <1.005* 06/24/2015 1828   PHURINE 5.5 06/24/2015 1828   GLUCOSEU NEGATIVE 06/24/2015 1828   HGBUR TRACE* 06/24/2015 1828   BILIRUBINUR NEGATIVE 06/24/2015 1828   KETONESUR NEGATIVE 06/24/2015 1828   PROTEINUR NEGATIVE 06/24/2015 1828   UROBILINOGEN 0.2 05/09/2014 1455   NITRITE NEGATIVE 06/24/2015 1828   LEUKOCYTESUR NEGATIVE 06/24/2015 1828   Sepsis Labs: @LABRCNTIP (procalcitonin:4,lacticidven:4)  ) Recent Results (from the past 240 hour(s))  Culture, blood (Routine X 2) w Reflex to ID Panel     Status: None (Preliminary result)   Collection Time: 06/24/15  7:20 PM  Result Value Ref Range Status   Specimen Description LEFT ANTECUBITAL  Final   Special Requests BOTTLES DRAWN AEROBIC AND ANAEROBIC 6CC  Final   Culture PENDING  Incomplete   Report Status PENDING  Incomplete  Culture, blood (Routine X 2) w Reflex to ID Panel     Status: None (Preliminary result)   Collection Time: 06/24/15  7:26 PM  Result Value Ref Range Status   Specimen Description BLOOD RIGHT HAND  Final   Special Requests BOTTLES DRAWN AEROBIC AND ANAEROBIC [facility]  Final   Culture PENDING  Incomplete   Report Status PENDING  Incomplete         Radiology Studies: Dg Chest 2 View  06/24/2015  CLINICAL DATA:  Sudden onset weakness.  History of COPD. EXAM: CHEST  2 VIEW COMPARISON:  01/09/2015 FINDINGS: The heart is enlarged. Prosthetic valve. There is right upper lobe infiltrate consistent with infectious process. Chronic  bibasilar changes are present. There is perihilar peribronchial thickening. IMPRESSION: 1. Right upper lobe infiltrate. 2. Followup PA and lateral chest X-ray is recommended in 3-4 weeks following trial of antibiotic therapy to ensure resolution and exclude underlying malignancy. 3. Cardiomegaly without pulmonary edema. Electronically Signed   By: Nolon Nations M.D.   On: 06/24/2015 16:31     Scheduled Meds: . amiodarone  100 mg Oral Daily  . azithromycin  500 mg Intravenous Q24H  . cefTRIAXone (ROCEPHIN)  IV  1 g Intravenous Q24H  . colchicine  0.3 mg Oral Daily  . fesoterodine  8 mg Oral Daily  . guaiFENesin  1,200 mg Oral BID  . ipratropium-albuterol  3 mL Nebulization Q6H  . levothyroxine  50 mcg Oral QAC breakfast  . pantoprazole  40 mg Oral Daily  . potassium chloride SA  20 mEq Oral Daily  . simvastatin  10 mg Oral q1800  . warfarin  2.5 mg Oral Q supper  . Warfarin - Pharmacist Dosing Inpatient  Does not apply q1800   Continuous Infusions: . sodium chloride 50 mL/hr at 06/24/15 2140     LOS: 2 days    Time spent: 25 minutes   Kathie Dike, MD  [facility] Pager [phone]  If 7PM-7AM, please contact night-coverage [email or web address] Password [facility] 06/26/2015, 7:54 AM

## 2015-06-27 DIAGNOSIS — J9601 Acute respiratory failure with hypoxia: Secondary | ICD-10-CM | POA: Diagnosis not present

## 2015-06-27 DIAGNOSIS — N183 Chronic kidney disease, stage 3 (moderate): Secondary | ICD-10-CM | POA: Diagnosis not present

## 2015-06-27 DIAGNOSIS — N179 Acute kidney failure, unspecified: Secondary | ICD-10-CM | POA: Diagnosis not present

## 2015-06-27 DIAGNOSIS — J189 Pneumonia, unspecified organism: Secondary | ICD-10-CM | POA: Diagnosis not present

## 2015-06-27 LAB — CBC
HCT: 31.4 % — ABNORMAL LOW (ref 36.0–46.0)
Hemoglobin: 10.4 g/dL — ABNORMAL LOW (ref 12.0–15.0)
MCH: 31.9 pg (ref 26.0–34.0)
MCHC: 33.1 g/dL (ref 30.0–36.0)
MCV: 96.3 fL (ref 78.0–100.0)
PLATELETS: 83 10*3/uL — AB (ref 150–400)
RBC: 3.26 MIL/uL — ABNORMAL LOW (ref 3.87–5.11)
RDW: 14.4 % (ref 11.5–15.5)
WBC: 5.2 10*3/uL (ref 4.0–10.5)

## 2015-06-27 LAB — PROTIME-INR
INR: 4.04 — ABNORMAL HIGH (ref 0.00–1.49)
PROTHROMBIN TIME: 38.3 s — AB (ref 11.6–15.2)

## 2015-06-27 MED ORDER — AZITHROMYCIN 250 MG PO TABS: 500.0000 mg | ORAL_TABLET | Freq: Every day | ORAL | Status: DC

## 2015-06-27 MED ORDER — GUAIFENESIN ER 600 MG PO TB12: 600.0000 mg | ORAL_TABLET | Freq: Two times a day (BID) | ORAL | Status: DC

## 2015-06-27 MED ORDER — AMOXICILLIN-POT CLAVULANATE 875-125 MG PO TABS: 1.0000 | ORAL_TABLET | Freq: Two times a day (BID) | ORAL | Status: DC

## 2015-06-27 NOTE — Progress Notes (Signed)
[place] for Coumadin Indication: atrial fibrillation  Allergies  Allergen Reactions  . Indomethacin Other (See Comments)    dizziness  . Norvasc [Amlodipine Besylate] Cough   Patient Measurements: Height: 5\' 2"  (157.5 cm) Weight: 146 lb (66.225 kg) IBW/kg (Calculated) : 50.1  Vital Signs: Temp: 97.6 F (36.4 C) (04/21 0522) Temp Source: Oral (04/21 0522) BP: 128/53 mmHg (04/21 0522) Pulse Rate: 75 (04/21 0522)  Labs:  Recent Labs  06/24/15 1549 06/25/15 0541 06/26/15 0636 06/27/15 0548  HGB 12.4 12.1 11.0* 10.4*  HCT 39.1 36.0 33.5* 31.4*  PLT 111* 96* 86* 83*  LABPROT 26.6* 24.7* 30.4* 38.3*  INR 2.49* 2.25* 2.98* 4.04*  CREATININE 1.91* 1.56* 1.66*  --    Estimated Creatinine Clearance: 26.5 mL/min (by C-G formula based on Cr of 1.66).  Medical History: Past Medical History  Diagnosis Date  . COPD (chronic obstructive pulmonary disease) ([place])   . History of TIA (transient ischemic attack)     Diagnosed 2003  . History of stroke     [address] [place]  . Obesity   . Juvenile rheumatic fever     Age 36  . Essential hypertension, benign   . Hyperlipidemia   . Mitral regurgitation     Status post bioprosthetic MVR (05/2013)  . CKD (chronic kidney disease) stage 4, GFR 15-29 ml/min (HCC)   . Paroxysmal atrial fibrillation (HCC)     s/p MAZE (05/2013)  . Chronic diastolic congestive heart failure ([place])     a) ECHO (09/2013) EF 50-55%  . Arthritis   . Aortic insufficiency due to bicuspid aortic valve     Moderate by TEE  . 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 S/P Maze operation for atrial fibrillation 05/16/2013    Complete bilateral atrial lesion set using cryothermy and bipolar radiofrequency ablation with oversewing of LA appendage  . NICM (nonischemic cardiomyopathy)  ([place])     a) LHC (04/2013) no significant CAD    Medications:  Prescriptions prior to admission  Medication Sig Dispense Refill Last Dose  . amiodarone (PACERONE) 200 MG tablet Take 0.5 tablets (100 mg total) by mouth daily. 30 tablet 6 06/24/2015 at Unknown time  . colchicine 0.6 MG tablet Take 0.5 tablets (0.3 mg total) by mouth daily. 30 tablet 0 06/24/2015 at Unknown time  . ferrous sulfate 325 (65 FE) MG tablet Take 325 mg by mouth 2 (two) times daily with a meal.   06/24/2015 at Unknown time  . levothyroxine (SYNTHROID, LEVOTHROID) 50 MCG tablet Take 50 mcg by mouth daily before breakfast.   06/24/2015 at Unknown time  . metolazone (ZAROXOLYN) 2.5 MG tablet Take 1 tablet (2.5 mg total) by mouth as directed. TAKE 1 TABLET AS NEEDED ONLY IF YOUR WEIGHT INCREASES 2-3 POUNDS IN 24 HOURS. (Patient taking differently: Take 2.5 mg by mouth daily. Marland Kitchen)   06/24/2015 at Unknown time  . omeprazole (PRILOSEC) 20 MG capsule Take 1 capsule (20 mg total) by mouth daily. Medicine to help protect your stomach lining from ulcers while on coumadin 30 capsule 3 06/24/2015 at Unknown time  . potassium chloride SA (K-DUR,KLOR-CON) 20 MEQ tablet Take 1 tablet (20 mEq total) by mouth daily. 60 tablet 6 06/24/2015 at Unknown time  . simvastatin (ZOCOR) 10 MG tablet TAKE ONE TABLET BY MOUTH EVERY EVENING. 30 tablet 3 06/23/2015 at  Unknown time  . tolterodine (DETROL LA) 4 MG 24 hr capsule Take 4 mg by mouth daily.   06/24/2015 at Unknown time  . torsemide (DEMADEX) 100 MG tablet TAKE ONE TABLET BY MOUTH EVERY MORNING 90 tablet 3 06/24/2015 at Unknown time  . warfarin (COUMADIN) 2.5 MG tablet Take 2.5 mg by mouth daily. At 1700   06/23/2015 at 1700  . torsemide (DEMADEX) 100 MG tablet STARTING TOMORROW, TAKE A HALF A TABLET DAILY FOR 3 DAYS AND THEN RESTART ONE TABLET DAILY THEREAFTER. (Patient not taking: Reported on 06/12/2015)   Taking    Assessment: 75 yo female presents with generalized weakness. Chest x-ray shows RUL  infiltrate. Patient has history of TIA, stroke, COPD, CKD, CHF, and atrial fib. Had mitral valve replacement with bioprothetic valve and maze procedure(05/16/2013) .  INR > 4 today, SUPRAtherapeutic. Hold dose today.  Increase in INR maybe related to azithromycin, continue to monitor.  Monitor CBC  Goal of Therapy:  INR 2-3 Monitor platelets by anticoagulation protocol: Yes   Plan:  HOLD coumadin dose today Daily PT/INR Monitor for S/S of bleeding  Hart Robinsons, PharmD Clinical Pharmacist 06/27/2015,10:57 AM

## 2015-06-27 NOTE — Progress Notes (Signed)
Pharmacy Antibiotic Note  [name] [name] is a 75 y.o. female admitted on 06/24/2015 with pneumonia.  Pharmacy has been consulted for Ceftriaxone dosing.  Plan: Ceftriaxone 1gm IV every 24 hours  PHARMACIST - PHYSICIAN COMMUNICATION CONCERNING: Antibiotic IV to Oral Route Change Policy  RECOMMENDATION: This patient is receiving ZITHROMAX by the intravenous route.  Based on criteria approved by the Pharmacy and Therapeutics Committee, the antibiotic(s) is/are being converted to the equivalent oral dose form(s).  DESCRIPTION: These criteria include:  Patient being treated for a respiratory tract infection, urinary tract infection, cellulitis or clostridium difficile associated diarrhea if on metronidazole  The patient is not neutropenic and does not exhibit a GI malabsorption state  The patient is eating (either orally or via tube) and/or has been taking other orally administered medications for a least 24 hours  The patient is improving clinically and has a Tmax < 100.5  If you have questions about this conversion, please contact the Pharmacy Department  [x]   [phone] )  [facility] []   [phone] )  [facility] []   [phone] )  [facility] []   [phone] )  [facility] []   [phone] )  [facility]   Height: 5\' 2"  (157.5 cm) Weight: 146 lb (66.225 kg) IBW/kg (Calculated) : 50.1  Temp (24hrs), Avg:98.4 F (36.9 C), Min:97.6 F (36.4 C), Max:99.2 F (37.3 C)   Recent Labs Lab 06/24/15 1549 06/25/15 0541 06/26/15 0636 06/27/15 0548  WBC 8.8 9.8 8.8 5.2  CREATININE 1.91* 1.56* 1.66*  --     Estimated Creatinine Clearance: 26.5 mL/min (by C-G formula based on Cr of 1.66).    Allergies  Allergen Reactions  . Indomethacin Other (See Comments)    dizziness  . Norvasc [Amlodipine Besylate] Cough   Antimicrobials this admission: Ceftriaxone 4/18 >>  Zithromax 4/18 >>   Microbiology results: 4/18 BCx: pending 4/18  UCx: pending  Thank you for allowing pharmacy to be a part of this patient's care.  Hart Robinsons, PharmD Clinical Pharmacist 06/27/2015

## 2015-06-27 NOTE — Progress Notes (Signed)
Notified Dr. Roderic Palau that the patients family wanted to know when he would be rounding to discuss if the patient would be discharged today or not.  MD verbalized understanding.  I notified the patient to let her know that I had spoken with the MD.

## 2015-06-27 NOTE — Care Management Note (Signed)
Case Management Note  Patient Details  Name: [name] [name] MRN: [id number] Date of Birth: 07/04/1940  Expected Discharge Date:     06/27/2015             Expected Discharge Plan:  [place]  In-House Referral:     Discharge planning Services  CM Consult  Post Acute Care Choice:    Choice offered to:  Patient  DME Arranged:    DME Agency:     HH Arranged:  RN, PT [place] Agency:  [place]  Status of Service:  In process, will continue to follow  Medicare Important Message Given:    Date Medicare IM Given:    Medicare IM give by:    Date Additional Medicare IM Given:    Additional Medicare Important Message give by:     If discussed at [place] of Stay Meetings, dates discussed:    Additional Comments: Pt discharging home today with [facility] services. Pt is aware HH has 48 hours to make their first visit. [facility] is aware of pt's DC today. Pt has all necessary DME. No further CM needs.   Sherald Barge, RN 06/27/2015, 3:13 PM

## 2015-06-27 NOTE — Progress Notes (Signed)
Physical Therapy Treatment Patient Details Name: [name] [name] MRN: [id number] DOB: Aug 28, 1940 Today's Date: 06/27/2015    History of Present Illness Today was brought to the hospital for generalized weakness, patient denies any symptoms. As per daughter patient was found to have generalized weakness over the past 2-3 days. Though she denies any complaints of chest pain or shortness of breath. No cough. Patient usually able to carry out her ADLs and IADLs, she lives by herself and is pretty functional..  CXR shows CAP.  PMH: COPD ; History of TIA ; History of stroke; Obesity; Juvenile rheumatic fever; Essential hypertension, benign; Hyperlipidemia; Mitral regurgitation; CKD stage 4, GFR 15-29 ml/min; Paroxysmal atrial fibrillation; Chronic diastolic congestive heart failure; Arthritis; Aortic insufficiency due to bicuspid aortic valve; S/P minimally invasive mitral valve replacement with bioprosthetic valve and maze procedure (05/16/2013); S/P Maze operation for atrial fibrillation (05/16/2013); and NICM (nonischemic cardiomyopathy    PT Comments    Pt received in bed, and was eager to participate in PT.  Pt expressed that she wanted to get home, and is waiting for the doctor to release her.  Pt demonstrated significant improvement in all functional mobility today.  She ambulated 422ft with RW and CGA with 3 short standing recovery periods, and 2L of O2.  Continue to recommend HHPT to optimize pt's return to PLOF.   Follow Up Recommendations  SNF;Home health PT;Supervision/Assistance - 24 hour     Equipment Recommendations  None recommended by PT    Recommendations for Other Services       Precautions / Restrictions Precautions Precautions: Fall Restrictions Weight Bearing Restrictions: No    Mobility  Bed Mobility Overal bed mobility: Needs Assistance Bed Mobility: Supine to Sit     Supine to sit: Min assist     General bed mobility comments: increased time, and use of  bed pad to assist pt's hips to the EOB.   Transfers Overall transfer level: Needs assistance Equipment used: Rolling walker (2 wheeled) Transfers: Sit to/from Stand Sit to Stand: Min guard         General transfer comment: vc's for hand placement to push up from the chair.   Ambulation/Gait Ambulation/Gait assistance: Min guard Ambulation Distance (Feet): 400 Feet Assistive device: Rolling walker (2 wheeled) (2L O2) Gait Pattern/deviations: Step-through pattern     General Gait Details: Pt still with slow cadence, but improved.  Pt required 3 short standing recovery periods due to fatigue.   Stairs            Wheelchair Mobility    Modified Rankin (Stroke Patients Only)       Balance           Standing balance support: Bilateral upper extremity supported Standing balance-[name] Scale: Fair                      Cognition Arousal/Alertness: Awake/alert Behavior During Therapy: WFL for tasks assessed/performed Overall Cognitive Status: Within Functional Limits for tasks assessed                      Exercises      General Comments        Pertinent Vitals/Pain Pain Assessment: No/denies pain    Home Living                      Prior Function            PT Goals (current goals can now be found  in the care plan section) Acute Rehab PT Goals Patient Stated Goal: To go back home PT Goal Formulation: With patient/family Time For Goal Achievement: 07/09/15 Potential to Achieve Goals: Fair Progress towards PT goals: Progressing toward goals    Frequency  Min 5X/week    PT Plan Current plan remains appropriate    Co-evaluation             End of Session Equipment Utilized During Treatment: Gait belt;Oxygen Activity Tolerance: Patient limited by fatigue Patient left: in chair;with call bell/phone within reach;with chair alarm set;with nursing/sitter in room     Time: [id number] PT Time Calculation (min) (ACUTE  ONLY): 24 min  Charges:  $Gait Training: 23-37 mins                    G Codes:  Functional Assessment Tool Used: Clinical Judgement   Beth Sarenity Ramaker, PT, DPT X: [phone]  06/27/2015, 3:03 PM

## 2015-06-27 NOTE — Progress Notes (Signed)
IV's removed. Discharge instructions reviewed with patient and daughter. Understanding verbalized. Patient on oxygen at home, but does not have portable tank at home. Patients daughter stated that she does not travel with oxygen and that they were going straight home.  O2 sat at this time 100% on 2 liters by [place]. O2 removed temporarily and patients O2 sat maintained 99-100% at rest. Patient daughter stated that she wanted to take her home and that she would be fine without oxygen.

## 2015-06-27 NOTE — Discharge Summary (Signed)
Physician Discharge Summary  [name] [name] [id number] DOB: 05/11/1940 DOA: 06/24/2015  PCP: [name], MD  Admit date: 06/24/2015 Discharge date: 06/27/2015  Time spent: 35 minutes  Recommendations for Outpatient Follow-up:  1. Follow up with PCP in 1-2 weeks for resolution of CAP.  2. Discharge with HHPT and 24/7 supervision.  3. Repeat CBC in one week to ensure resolution of thrombocytopenia.  4. Follow CXR in 4 weeks for resolution of pneumonia.   Discharge Diagnoses:  Active Problems:   Juvenile rheumatic fever   Paroxysmal atrial fibrillation (HCC)   S/P MVR (mitral valve replacement)   Long term current use of anticoagulant therapy   Acute respiratory failure with hypoxia (HCC)   Hypothyroidism   CAP (community acquired pneumonia)   CKD (chronic kidney disease) stage 3, GFR 30-59 ml/min   AKI (acute kidney injury) ([place])   Discharge Condition: Improved   Diet recommendation: Heart healthy   Filed Weights   06/24/15 1538  Weight: 66.225 kg (146 lb)    History of present illness:  59 yof with past medical history of COPD, Essential HTN, HLD, chronic diastolic heart failure and aortic insufficiency due to bicuspid valve aoritic valve s/p minimally invasive mitral valve replacement with bioprosthetic valve and maze procedure (05/16/2013); S/P Maze operation for atrial fibrillation (05/16/2013); and NICM. Presented with complaints of generalized weakness. While in the ED chest x-ray showed right upper lobe infiltrate. Patient started on Rocephin and Zithromax and admitted for further treatment   Hospital Course:  Patient admitted for pneumonia, which was noted to clinically improve with IV abx and pulmonary hygiene. Intermittent fevers have resolved and she remained afebrile for 48 hours on discharge. Transitioned to PO abx on discharge.  Due to complaints of weakness and poor oral intake, PT evaluated and recommended SNF, but patient reported this was not an option  due to previous experiences. Patient will be discharged instead with HHPT and 24/7 supervision.  She will need to repeat CXR in 4 weeks. Respiratory status appears to  Be back to baseline.  1. Acute respiratory failure with hypoxia. Related to pneumonia. Resolved.   2. AKI on CKD Stage III. Related to dehydration. Creatinine improving with hydration. 3. Paroxysmal atrial fibrillation, CHADS VASC score 4. Anticoagulated on Warfarin. Currently in SR.  4. Thrombocytopenia. Likely related to infectious process. Platelet count is still low although remains stable. Repeat CBC in 1 week to ensure recovery. 5. S/p mitral valve replacement. Stable. Patient has history of rheumatic fever as a child, and has bioprosthetic mitral valve 6. Hypothyroidism, continue synthroid.  7. Chronic diastolic congestive heart failure. Appears compensated.  Procedures:  None  Consultations:  None  Discharge Exam: Filed Vitals:   06/27/15 0522 06/27/15 1416  BP: 128/53 114/51  Pulse: 75 79  Temp: 97.6 F (36.4 C) 98 F (36.7 C)  Resp: 20 20     General: NAD  Cardiovascular: RRR, S1, S2   Respiratory: Crackles at bases.   Abdomen: soft, non tender, no distention , bowel sounds normal  Musculoskeletal: No edema b/l  Discharge Instructions   Discharge Instructions    AMB Referral to [facility]    Complete by:  As directed   Please refer to [facility] for transition of care program and restart of [facility] community Consent on file.  Please call with any questions or concerns.  Royetta Crochet. Laymond Purser, RN, BSN, [place] [phone]) Business Cell  [phone]) Toll Free Office  Reason for consult:  THN active  Diagnoses of:  COPD/ Pneumonia  Expected date of contact:  1-3 days (reserved for hospital discharges)     Diet - low sodium heart healthy    Complete by:  As directed      Increase activity slowly    Complete by:  As  directed           Current Discharge Medication List    START taking these medications   Details  amoxicillin-clavulanate (AUGMENTIN) 875-125 MG tablet Take 1 tablet by mouth 2 (two) times daily. Qty: 4 tablet, Refills: 0    guaiFENesin (MUCINEX) 600 MG 12 hr tablet Take 1 tablet (600 mg total) by mouth 2 (two) times daily. Qty: 30 tablet, Refills: 1      CONTINUE these medications which have NOT CHANGED   Details  amiodarone (PACERONE) 200 MG tablet Take 0.5 tablets (100 mg total) by mouth daily. Qty: 30 tablet, Refills: 6    colchicine 0.6 MG tablet Take 0.5 tablets (0.3 mg total) by mouth daily. Qty: 30 tablet, Refills: 0    ferrous sulfate 325 (65 FE) MG tablet Take 325 mg by mouth 2 (two) times daily with a meal.    levothyroxine (SYNTHROID, LEVOTHROID) 50 MCG tablet Take 50 mcg by mouth daily before breakfast.    metolazone (ZAROXOLYN) 2.5 MG tablet Take 1 tablet (2.5 mg total) by mouth as directed. TAKE 1 TABLET AS NEEDED ONLY IF YOUR WEIGHT INCREASES 2-3 POUNDS IN 24 HOURS.    omeprazole (PRILOSEC) 20 MG capsule Take 1 capsule (20 mg total) by mouth daily. Medicine to help protect your stomach lining from ulcers while on coumadin Qty: 30 capsule, Refills: 3    potassium chloride SA (K-DUR,KLOR-CON) 20 MEQ tablet Take 1 tablet (20 mEq total) by mouth daily. Qty: 60 tablet, Refills: 6    simvastatin (ZOCOR) 10 MG tablet TAKE ONE TABLET BY MOUTH EVERY EVENING. Qty: 30 tablet, Refills: 3    tolterodine (DETROL LA) 4 MG 24 hr capsule Take 4 mg by mouth daily.    torsemide (DEMADEX) 100 MG tablet TAKE ONE TABLET BY MOUTH EVERY MORNING Qty: 90 tablet, Refills: 3    warfarin (COUMADIN) 2.5 MG tablet Take 2.5 mg by mouth daily. At 1700       Allergies  Allergen Reactions  . Indomethacin Other (See Comments)    dizziness  . Norvasc [Amlodipine Besylate] Cough      The results of significant diagnostics from this hospitalization (including imaging, microbiology,  ancillary and laboratory) are listed below for reference.    Significant Diagnostic Studies: Dg Chest 2 View  06/24/2015  CLINICAL DATA:  Sudden onset weakness.  History of COPD. EXAM: CHEST  2 VIEW COMPARISON:  01/09/2015 FINDINGS: The heart is enlarged. Prosthetic valve. There is right upper lobe infiltrate consistent with infectious process. Chronic bibasilar changes are present. There is perihilar peribronchial thickening. IMPRESSION: 1. Right upper lobe infiltrate. 2. Followup PA and lateral chest X-ray is recommended in 3-4 weeks following trial of antibiotic therapy to ensure resolution and exclude underlying malignancy. 3. Cardiomegaly without pulmonary edema. Electronically Signed   By: Nolon Nations M.D.   On: 06/24/2015 16:31    Microbiology: Recent Results (from the past 240 hour(s))  Culture, blood (Routine X 2) w Reflex to ID Panel     Status: None (Preliminary result)   Collection Time: 06/24/15  7:20 PM  Result Value Ref Range Status   Specimen Description LEFT ANTECUBITAL  Final  Special Requests BOTTLES DRAWN AEROBIC AND ANAEROBIC 6CC  Final   Culture NO GROWTH 3 DAYS  Final   Report Status PENDING  Incomplete  Culture, blood (Routine X 2) w Reflex to ID Panel     Status: None (Preliminary result)   Collection Time: 06/24/15  7:26 PM  Result Value Ref Range Status   Specimen Description BLOOD RIGHT HAND  Final   Special Requests BOTTLES DRAWN AEROBIC AND ANAEROBIC 6CC  Final   Culture NO GROWTH 3 DAYS  Final   Report Status PENDING  Incomplete     Labs: Basic Metabolic Panel:  Recent Labs Lab 06/24/15 1549 06/25/15 0541 06/26/15 0636  NA 140 137 137  K 4.0 3.7 3.9  CL 99* 100* 101  CO2 31 29 27   GLUCOSE 107* 97 93  BUN 40* 38* 36*  CREATININE 1.91* 1.56* 1.66*  CALCIUM 8.8* 8.9 8.9   Liver Function Tests:  Recent Labs Lab 06/24/15 1549 06/25/15 0541  AST 43* 34  ALT 29 25  ALKPHOS 107 88  BILITOT 1.3* 1.6*  PROT 7.5 6.7  ALBUMIN 3.5 2.9*    CBC:  Recent Labs Lab 06/24/15 1549 06/25/15 0541 06/26/15 0636 06/27/15 0548  WBC 8.8 9.8 8.8 5.2  NEUTROABS 7.1  --   --   --   HGB 12.4 12.1 11.0* 10.4*  HCT 39.1 36.0 33.5* 31.4*  MCV 97.0 96.8 97.1 96.3  PLT 111* 96* 86* 83*    Recent Labs  11/02/14 0115  BNP 1152.0*    Signed:  Kathie Dike, MD  [facility] 06/27/2015, 2:31 PM    By signing my name below, I, Rennis Harding, attest that this documentation has been prepared under the direction and in the presence of Kathie Dike, MD. Electronically signed: Rennis Harding, Scribe. 06/27/2015 2:20pm   I, Dr. Kathie Dike, personally performed the services described in this documentaiton. All medical record entries made by the scribe were at my direction and in my presence. I have reviewed the chart and agree that the record reflects my personal performance and is accurate and complete  Kathie Dike, MD, 06/27/2015 2:31 PM

## 2015-06-30 ENCOUNTER — Other Ambulatory Visit: Payer: Self-pay | Admitting: *Deleted

## 2015-06-30 ENCOUNTER — Encounter: Payer: Self-pay | Admitting: *Deleted

## 2015-06-30 LAB — CULTURE, BLOOD (ROUTINE X 2)
CULTURE: NO GROWTH
Culture: NO GROWTH

## 2015-06-30 NOTE — Patient Outreach (Signed)
06/30/15- Patient hospitalized 4/18-4/21/17 for pneumonia, telephone call to patient for transition of care week 1, spoke with pt, HIPAA verified, pt reports her daughter and granddaughter oversee medications, transportation and other aspects of care.  Home health to see see pt 07/01/15 for start of care.  RN CM reviewed discharge instructions with pt and reviewed chest xray in 4 weeks, CBC in one week for thrombocytopenia, pt is weighing daily and weight today 151 pounds. Pt agreeable to weekly transition of care calls.  RN CM faxed barrier letter to primary MD Dr. Quintin Alto.  THN CM Care Plan Problem One        Most Recent Value   Care Plan Problem One  Knowledge deficity related to CHF disease process   Role Documenting the Problem One  Care Management Coordinator   Care Plan for Problem One  Active   THN Long Term Goal (31-90 days)  pt will have no admissions related to heart failure within 90 days.   THN Long Term Goal Start Date  06/30/15   Interventions for Problem One Long Term Goal  RN CM reviewed discharge instructions and medications with patient, reviewed importance of symptom management, signs/ symptoms pneumonia exacerbation   THN CM Short Term Goal #1 (0-30 days)  pt will follow up with primary care MD on 07/09/15   [facility] CM Short Term Goal #1 Start Date  06/30/15   Interventions for Short Term Goal #1  RN CM reviewed pt of importance of seeing primary care MD on 07/09/15, reminded of blood work due in one week.      PLAN Follow up with initial home visit 07/02/15  [name] [facility], [place] Coordinator [phone]

## 2015-07-01 DIAGNOSIS — I5032 Chronic diastolic (congestive) heart failure: Secondary | ICD-10-CM | POA: Diagnosis not present

## 2015-07-01 DIAGNOSIS — J440 Chronic obstructive pulmonary disease with acute lower respiratory infection: Secondary | ICD-10-CM | POA: Diagnosis not present

## 2015-07-01 DIAGNOSIS — N183 Chronic kidney disease, stage 3 (moderate): Secondary | ICD-10-CM | POA: Diagnosis not present

## 2015-07-01 DIAGNOSIS — J449 Chronic obstructive pulmonary disease, unspecified: Secondary | ICD-10-CM | POA: Diagnosis not present

## 2015-07-01 DIAGNOSIS — I480 Paroxysmal atrial fibrillation: Secondary | ICD-10-CM | POA: Diagnosis not present

## 2015-07-01 DIAGNOSIS — J189 Pneumonia, unspecified organism: Secondary | ICD-10-CM | POA: Diagnosis not present

## 2015-07-01 DIAGNOSIS — I061 Rheumatic aortic insufficiency: Secondary | ICD-10-CM | POA: Diagnosis not present

## 2015-07-01 DIAGNOSIS — E785 Hyperlipidemia, unspecified: Secondary | ICD-10-CM | POA: Diagnosis not present

## 2015-07-01 DIAGNOSIS — J9601 Acute respiratory failure with hypoxia: Secondary | ICD-10-CM | POA: Diagnosis not present

## 2015-07-02 ENCOUNTER — Encounter: Payer: Self-pay | Admitting: *Deleted

## 2015-07-02 ENCOUNTER — Other Ambulatory Visit: Payer: Self-pay | Admitting: *Deleted

## 2015-07-02 NOTE — Patient Outreach (Signed)
[name] [name]) Care Management   07/02/2015  [name] [name] 1940-09-25 [id number]  [name] [name] is an 75 y.o. female  Subjective: Initial home visit with pt, HIPAA verified, pt reports she was in [name] with pneumonia and had no symptoms letting her know she had pneumonia.  Pt states she could benefit from teaching about pneumonia. Pt feels she understands CHF action plan but is receptive to reinforcement/ reminders at home visits.  Pt states " I'm still weighing everyday and trying to watch my salt intake"  Pt lives alone, her family assists her daily and oversee medications, pt states " my daughter has my medicine bottles"  Pt reports home health is working with her now and made home visit yesterday.  Objective:   Filed Vitals:   07/02/15 1220  BP: 120/58  Pulse: 74  Resp: 18  Height: 1.575 m (5\' 2" )  Weight: 152 lb (68.947 kg)  SpO2: 98%   ROS  Physical Exam  Constitutional: She is oriented to person, place, and time. She appears well-developed and well-nourished.  HENT:  Head: Normocephalic.  Neck: Normal range of motion. Neck supple.  Cardiovascular: Normal rate and regular rhythm.   Respiratory: Effort normal and breath sounds normal.  GI: Soft. Bowel sounds are normal.  Musculoskeletal: Normal range of motion. She exhibits edema.  2+ edema left lower extremity 1+ edema right lower extremity  Neurological: She is alert and oriented to person, place, and time.  Skin: Skin is warm and dry.  Psychiatric: She has a normal mood and affect. Her behavior is normal. Thought content normal.    Encounter Medications:   Outpatient Encounter Prescriptions as of 07/02/2015  Medication Sig  . amiodarone (PACERONE) 200 MG tablet Take 0.5 tablets (100 mg total) by mouth daily.  Marland Kitchen amoxicillin-clavulanate (AUGMENTIN) 875-125 MG tablet Take 1 tablet by mouth 2 (two) times daily.  . colchicine 0.6 MG tablet Take 0.5 tablets (0.3 mg total) by mouth daily.   . ferrous sulfate 325 (65 FE) MG tablet Take 325 mg by mouth 2 (two) times daily with a meal.  . guaiFENesin (MUCINEX) 600 MG 12 hr tablet Take 1 tablet (600 mg total) by mouth 2 (two) times daily.  Marland Kitchen levothyroxine (SYNTHROID, LEVOTHROID) 50 MCG tablet Take 50 mcg by mouth daily before breakfast.  . metolazone (ZAROXOLYN) 2.5 MG tablet Take 1 tablet (2.5 mg total) by mouth as directed. TAKE 1 TABLET AS NEEDED ONLY IF YOUR WEIGHT INCREASES 2-3 POUNDS IN 24 HOURS. (Patient taking differently: Take 2.5 mg by mouth daily. Marland Kitchen)  . omeprazole (PRILOSEC) 20 MG capsule Take 1 capsule (20 mg total) by mouth daily. Medicine to help protect your stomach lining from ulcers while on coumadin  . potassium chloride SA (K-DUR,KLOR-CON) 20 MEQ tablet Take 1 tablet (20 mEq total) by mouth daily.  . simvastatin (ZOCOR) 10 MG tablet TAKE ONE TABLET BY MOUTH EVERY EVENING.  Marland Kitchen tolterodine (DETROL LA) 4 MG 24 hr capsule Take 4 mg by mouth daily.  Marland Kitchen torsemide (DEMADEX) 100 MG tablet TAKE ONE TABLET BY MOUTH EVERY MORNING  . warfarin (COUMADIN) 2.5 MG tablet Take 2.5 mg by mouth daily. At 1700   No facility-administered encounter medications on file as of 07/02/2015.    Functional Status:   In your present state of health, do you have any difficulty performing the following activities: 07/02/2015 06/24/2015  Hearing? N N  Vision? N N  Difficulty concentrating or making decisions? N N  Walking or climbing stairs? Darreld Mclean  Y  Dressing or bathing? Y N  Doing errands, shopping? [name]  Preparing Food and eating ? N -  Using the Toilet? N -  In the past six months, have you accidently leaked urine? Y -  Do you have problems with loss of bowel control? N -  Managing your Medications? Y -  Managing your Finances? N -  Housekeeping or managing your Housekeeping? Y -    Fall/Depression Screening:    PHQ 2/9 Scores 07/02/2015 11/15/2014 08/19/2014 06/07/2014  PHQ - 2 Score 0 0 0 0   Fall Risk  07/02/2015 03/19/2015 12/12/2014 12/12/2014  11/15/2014  Falls in the past year? No No No - No  Number falls in past yr: - - - - -  Injury with Fall? - - - - -  Risk for fall due to : Medication side effect Impaired balance/gait Impaired balance/gait Impaired balance/gait Impaired balance/gait    Assessment:  RN CM focus of teaching is pneumonia with emphasis also on CHF/ action plan and reinforcement.  RN CM called patient's daughter [name] and she reports her granddaughter has medication bottles and is at work and unable to talk at present, [name] states the medication list from [name] is correct and is what pt is taking.  Pt already has [facility] calendar, has consent form already on file and does not wish to make any changes.  RN CM reviewed safety precautions with pt (being careful with 02 tubing, keeping pathways clear and asking for assistance as needed).  RN CM faxed initial home visit and barrier letter to primary care MD Dr. Consuello Masse.  THN CM Care Plan Problem One        Most Recent Value   Care Plan Problem One  Knowledge deficity related to CHF disease process   Role Documenting the Problem One  Care Management Coordinator   Care Plan for Problem One  Active   THN Long Term Goal (31-90 days)  pt will have no admissions related to heart failure or pneumonia within 90 days.   THN Long Term Goal Start Date  06/30/15   Interventions for Problem One Long Term Goal  RN CM reviewed CHF zones with pt although pt states she feels she knows the action plan, RN praised and encouraged pt for weighing daily, reviewed importance of calling MD early for change in health status/ symptoms.  Pt agreeable for reinforcement at visits.   THN CM Short Term Goal #1 (0-30 days)  pt will follow up with primary care MD on 07/09/15   [facility] CM Short Term Goal #1 Start Date  06/30/15   Interventions for Short Term Goal #1  RN CM reviewed upcoming appointments with pt and importance of attending.    THN CM Care Plan Problem Two        Most Recent Value   Care  Plan Problem Two  Knowledge deficit related to pneumonia (recognizing symptoms, causes)   Role Documenting the Problem Two  Care Management Coordinator   Care Plan for Problem Two  Active   THN CM Short Term Goal #1 (0-30 days)  Pt will verbalize early warning signs and symptom management related to pneumonia   [facility]N CM Short Term Goal #1 Start Date  07/02/15   Interventions for Short Term Goal #2   RN CM reviewed pneumonia EMMI handouts, reviewed signs/ symptoms pneumonia and importance of calling MD early for change in symptoms (cough, change in mucus, fever, etc.)    [facility] [name] CM Care  Plan Problem Three        Most Recent Value   Care Plan Problem Three  .      Plan: follow up with home visit 07/21/15 Assess weight Continue teaching pneumonia, CHF/ symptom management  [name] [facility], [place] Coordinator [phone]

## 2015-07-03 DIAGNOSIS — J449 Chronic obstructive pulmonary disease, unspecified: Secondary | ICD-10-CM | POA: Diagnosis not present

## 2015-07-03 DIAGNOSIS — J440 Chronic obstructive pulmonary disease with acute lower respiratory infection: Secondary | ICD-10-CM | POA: Diagnosis not present

## 2015-07-03 DIAGNOSIS — I480 Paroxysmal atrial fibrillation: Secondary | ICD-10-CM | POA: Diagnosis not present

## 2015-07-03 DIAGNOSIS — J189 Pneumonia, unspecified organism: Secondary | ICD-10-CM | POA: Diagnosis not present

## 2015-07-03 DIAGNOSIS — N183 Chronic kidney disease, stage 3 (moderate): Secondary | ICD-10-CM | POA: Diagnosis not present

## 2015-07-03 DIAGNOSIS — I5032 Chronic diastolic (congestive) heart failure: Secondary | ICD-10-CM | POA: Diagnosis not present

## 2015-07-03 DIAGNOSIS — I061 Rheumatic aortic insufficiency: Secondary | ICD-10-CM | POA: Diagnosis not present

## 2015-07-03 DIAGNOSIS — E785 Hyperlipidemia, unspecified: Secondary | ICD-10-CM | POA: Diagnosis not present

## 2015-07-03 DIAGNOSIS — J9601 Acute respiratory failure with hypoxia: Secondary | ICD-10-CM | POA: Diagnosis not present

## 2015-07-07 ENCOUNTER — Other Ambulatory Visit: Payer: Self-pay | Admitting: *Deleted

## 2015-07-07 DIAGNOSIS — I5032 Chronic diastolic (congestive) heart failure: Secondary | ICD-10-CM | POA: Diagnosis not present

## 2015-07-07 DIAGNOSIS — I061 Rheumatic aortic insufficiency: Secondary | ICD-10-CM | POA: Diagnosis not present

## 2015-07-07 DIAGNOSIS — J189 Pneumonia, unspecified organism: Secondary | ICD-10-CM | POA: Diagnosis not present

## 2015-07-07 DIAGNOSIS — I480 Paroxysmal atrial fibrillation: Secondary | ICD-10-CM | POA: Diagnosis not present

## 2015-07-07 DIAGNOSIS — E785 Hyperlipidemia, unspecified: Secondary | ICD-10-CM | POA: Diagnosis not present

## 2015-07-07 DIAGNOSIS — J9601 Acute respiratory failure with hypoxia: Secondary | ICD-10-CM | POA: Diagnosis not present

## 2015-07-07 DIAGNOSIS — J440 Chronic obstructive pulmonary disease with acute lower respiratory infection: Secondary | ICD-10-CM | POA: Diagnosis not present

## 2015-07-07 DIAGNOSIS — N183 Chronic kidney disease, stage 3 (moderate): Secondary | ICD-10-CM | POA: Diagnosis not present

## 2015-07-07 DIAGNOSIS — J449 Chronic obstructive pulmonary disease, unspecified: Secondary | ICD-10-CM | POA: Diagnosis not present

## 2015-07-07 NOTE — Patient Outreach (Signed)
07/07/15- Telephone call for transition of care week 2, spoke with pt, HIPAA verified, pt reports her weight is 152 pounds and she continues weighing daily, home health continues seeing pt weekly, pt to see primary care on 07/09/15, pt states " edema is about the same"  Pt agreeable to continue weekly transition of care calls.  [facility]N CM Care Plan Problem One        Most Recent Value   Care Plan Problem One  Knowledge deficity related to CHF disease process   Role Documenting the Problem One  Care Management Coordinator   Care Plan for Problem One  Active   THN Long Term Goal (31-90 days)  pt will have no admissions related to heart failure or pneumonia within 90 days.   THN Long Term Goal Start Date  06/30/15   Interventions for Problem One Long Term Goal  RN CM reinforced CHF zones/ action plan   [facility]N CM Short Term Goal #1 (0-30 days)  pt will follow up with primary care MD on 07/09/15   [facility] CM Short Term Goal #1 Start Date  06/30/15   Interventions for Short Term Goal #1  RN CM reviewed with pt all upcoming appointments    [facility] CM Care Plan Problem Two        Most Recent Value   Care Plan Problem Two  Knowledge deficit related to pneumonia (recognizing symptoms, causes)   Role Documenting the Problem Two  Care Management Coordinator   Care Plan for Problem Two  Active   THN CM Short Term Goal #1 (0-30 days)  Pt will verbalize early warning signs and symptom management related to pneumonia   [facility]N CM Short Term Goal #1 Start Date  07/02/15   Interventions for Short Term Goal #2   RN CM reviewed pneumonia exacerbation    [facility]N CM Care Plan Problem Three        Most Recent Value   Care Plan Problem Three  .      PLAN Continue weekly transition of care calls  [name] [facility], [place] Coordinator [phone]

## 2015-07-08 DIAGNOSIS — I5032 Chronic diastolic (congestive) heart failure: Secondary | ICD-10-CM | POA: Diagnosis not present

## 2015-07-08 DIAGNOSIS — I480 Paroxysmal atrial fibrillation: Secondary | ICD-10-CM | POA: Diagnosis not present

## 2015-07-08 DIAGNOSIS — J9601 Acute respiratory failure with hypoxia: Secondary | ICD-10-CM | POA: Diagnosis not present

## 2015-07-08 DIAGNOSIS — J440 Chronic obstructive pulmonary disease with acute lower respiratory infection: Secondary | ICD-10-CM | POA: Diagnosis not present

## 2015-07-08 DIAGNOSIS — J449 Chronic obstructive pulmonary disease, unspecified: Secondary | ICD-10-CM | POA: Diagnosis not present

## 2015-07-08 DIAGNOSIS — N183 Chronic kidney disease, stage 3 (moderate): Secondary | ICD-10-CM | POA: Diagnosis not present

## 2015-07-08 DIAGNOSIS — I501 Left ventricular failure: Secondary | ICD-10-CM | POA: Diagnosis not present

## 2015-07-08 DIAGNOSIS — J189 Pneumonia, unspecified organism: Secondary | ICD-10-CM | POA: Diagnosis not present

## 2015-07-08 DIAGNOSIS — E785 Hyperlipidemia, unspecified: Secondary | ICD-10-CM | POA: Diagnosis not present

## 2015-07-08 DIAGNOSIS — I061 Rheumatic aortic insufficiency: Secondary | ICD-10-CM | POA: Diagnosis not present

## 2015-07-09 DIAGNOSIS — N183 Chronic kidney disease, stage 3 (moderate): Secondary | ICD-10-CM | POA: Diagnosis not present

## 2015-07-09 DIAGNOSIS — I5032 Chronic diastolic (congestive) heart failure: Secondary | ICD-10-CM | POA: Diagnosis not present

## 2015-07-09 DIAGNOSIS — I482 Chronic atrial fibrillation: Secondary | ICD-10-CM | POA: Diagnosis not present

## 2015-07-09 DIAGNOSIS — J440 Chronic obstructive pulmonary disease with acute lower respiratory infection: Secondary | ICD-10-CM | POA: Diagnosis not present

## 2015-07-09 DIAGNOSIS — E785 Hyperlipidemia, unspecified: Secondary | ICD-10-CM | POA: Diagnosis not present

## 2015-07-09 DIAGNOSIS — J9601 Acute respiratory failure with hypoxia: Secondary | ICD-10-CM | POA: Diagnosis not present

## 2015-07-09 DIAGNOSIS — I5022 Chronic systolic (congestive) heart failure: Secondary | ICD-10-CM | POA: Diagnosis not present

## 2015-07-09 DIAGNOSIS — I061 Rheumatic aortic insufficiency: Secondary | ICD-10-CM | POA: Diagnosis not present

## 2015-07-09 DIAGNOSIS — J158 Pneumonia due to other specified bacteria: Secondary | ICD-10-CM | POA: Diagnosis not present

## 2015-07-09 DIAGNOSIS — J189 Pneumonia, unspecified organism: Secondary | ICD-10-CM | POA: Diagnosis not present

## 2015-07-09 DIAGNOSIS — I480 Paroxysmal atrial fibrillation: Secondary | ICD-10-CM | POA: Diagnosis not present

## 2015-07-09 DIAGNOSIS — I10 Essential (primary) hypertension: Secondary | ICD-10-CM | POA: Diagnosis not present

## 2015-07-09 DIAGNOSIS — J449 Chronic obstructive pulmonary disease, unspecified: Secondary | ICD-10-CM | POA: Diagnosis not present

## 2015-07-10 DIAGNOSIS — J189 Pneumonia, unspecified organism: Secondary | ICD-10-CM | POA: Diagnosis not present

## 2015-07-10 DIAGNOSIS — I061 Rheumatic aortic insufficiency: Secondary | ICD-10-CM | POA: Diagnosis not present

## 2015-07-10 DIAGNOSIS — I480 Paroxysmal atrial fibrillation: Secondary | ICD-10-CM | POA: Diagnosis not present

## 2015-07-10 DIAGNOSIS — J440 Chronic obstructive pulmonary disease with acute lower respiratory infection: Secondary | ICD-10-CM | POA: Diagnosis not present

## 2015-07-10 DIAGNOSIS — I5032 Chronic diastolic (congestive) heart failure: Secondary | ICD-10-CM | POA: Diagnosis not present

## 2015-07-10 DIAGNOSIS — J9601 Acute respiratory failure with hypoxia: Secondary | ICD-10-CM | POA: Diagnosis not present

## 2015-07-10 DIAGNOSIS — J449 Chronic obstructive pulmonary disease, unspecified: Secondary | ICD-10-CM | POA: Diagnosis not present

## 2015-07-10 DIAGNOSIS — E785 Hyperlipidemia, unspecified: Secondary | ICD-10-CM | POA: Diagnosis not present

## 2015-07-10 DIAGNOSIS — N183 Chronic kidney disease, stage 3 (moderate): Secondary | ICD-10-CM | POA: Diagnosis not present

## 2015-07-14 ENCOUNTER — Other Ambulatory Visit: Payer: Self-pay | Admitting: *Deleted

## 2015-07-14 DIAGNOSIS — J189 Pneumonia, unspecified organism: Secondary | ICD-10-CM | POA: Diagnosis not present

## 2015-07-14 DIAGNOSIS — I480 Paroxysmal atrial fibrillation: Secondary | ICD-10-CM | POA: Diagnosis not present

## 2015-07-14 DIAGNOSIS — I5032 Chronic diastolic (congestive) heart failure: Secondary | ICD-10-CM | POA: Diagnosis not present

## 2015-07-14 DIAGNOSIS — I061 Rheumatic aortic insufficiency: Secondary | ICD-10-CM | POA: Diagnosis not present

## 2015-07-14 DIAGNOSIS — J449 Chronic obstructive pulmonary disease, unspecified: Secondary | ICD-10-CM | POA: Diagnosis not present

## 2015-07-14 DIAGNOSIS — E785 Hyperlipidemia, unspecified: Secondary | ICD-10-CM | POA: Diagnosis not present

## 2015-07-14 DIAGNOSIS — N183 Chronic kidney disease, stage 3 (moderate): Secondary | ICD-10-CM | POA: Diagnosis not present

## 2015-07-14 DIAGNOSIS — J9601 Acute respiratory failure with hypoxia: Secondary | ICD-10-CM | POA: Diagnosis not present

## 2015-07-14 DIAGNOSIS — J440 Chronic obstructive pulmonary disease with acute lower respiratory infection: Secondary | ICD-10-CM | POA: Diagnosis not present

## 2015-07-14 NOTE — Patient Outreach (Addendum)
07/14/15- Telephone call to patient for transition of care week 3, spoke with pt, HIPAA verified, pt reports she continues weighing daily, weight today is 152.8 pounds, denies dyspnea.  Home health continues seeing pt, pt saw primary care MD on 07/09/15 and "no changes made, everything is good"  [facility] CM Care Plan Problem One        Most Recent Value   Care Plan Problem One  Knowledge deficity related to CHF disease process   Role Documenting the Problem One  Care Management Coordinator   Care Plan for Problem One  Active   THN Long Term Goal (31-90 days)  pt will have no admissions related to heart failure or pneumonia within 90 days.   THN Long Term Goal Start Date  06/30/15   Interventions for Problem One Long Term Goal  RN CM reminded pt of importance of daily weights and reviewed action plan.     PLAN Follow up with home visit transition of care week 4 on 07/21/15   [name] [facility], [place] Coordinator [phone]

## 2015-07-16 DIAGNOSIS — I061 Rheumatic aortic insufficiency: Secondary | ICD-10-CM | POA: Diagnosis not present

## 2015-07-16 DIAGNOSIS — I480 Paroxysmal atrial fibrillation: Secondary | ICD-10-CM | POA: Diagnosis not present

## 2015-07-16 DIAGNOSIS — J440 Chronic obstructive pulmonary disease with acute lower respiratory infection: Secondary | ICD-10-CM | POA: Diagnosis not present

## 2015-07-16 DIAGNOSIS — J9601 Acute respiratory failure with hypoxia: Secondary | ICD-10-CM | POA: Diagnosis not present

## 2015-07-16 DIAGNOSIS — E785 Hyperlipidemia, unspecified: Secondary | ICD-10-CM | POA: Diagnosis not present

## 2015-07-16 DIAGNOSIS — J449 Chronic obstructive pulmonary disease, unspecified: Secondary | ICD-10-CM | POA: Diagnosis not present

## 2015-07-16 DIAGNOSIS — N183 Chronic kidney disease, stage 3 (moderate): Secondary | ICD-10-CM | POA: Diagnosis not present

## 2015-07-16 DIAGNOSIS — I5032 Chronic diastolic (congestive) heart failure: Secondary | ICD-10-CM | POA: Diagnosis not present

## 2015-07-16 DIAGNOSIS — J189 Pneumonia, unspecified organism: Secondary | ICD-10-CM | POA: Diagnosis not present

## 2015-07-17 ENCOUNTER — Ambulatory Visit (INDEPENDENT_AMBULATORY_CARE_PROVIDER_SITE_OTHER): Payer: Commercial Managed Care - HMO | Admitting: *Deleted

## 2015-07-17 DIAGNOSIS — I5043 Acute on chronic combined systolic (congestive) and diastolic (congestive) heart failure: Secondary | ICD-10-CM

## 2015-07-17 DIAGNOSIS — Z23 Encounter for immunization: Secondary | ICD-10-CM | POA: Diagnosis not present

## 2015-07-17 DIAGNOSIS — S8991XA Unspecified injury of right lower leg, initial encounter: Secondary | ICD-10-CM | POA: Diagnosis not present

## 2015-07-17 DIAGNOSIS — S81811A Laceration without foreign body, right lower leg, initial encounter: Secondary | ICD-10-CM | POA: Diagnosis not present

## 2015-07-17 DIAGNOSIS — I5033 Acute on chronic diastolic (congestive) heart failure: Secondary | ICD-10-CM

## 2015-07-17 DIAGNOSIS — Z8673 Personal history of transient ischemic attack (TIA), and cerebral infarction without residual deficits: Secondary | ICD-10-CM | POA: Diagnosis not present

## 2015-07-17 DIAGNOSIS — Z9889 Other specified postprocedural states: Secondary | ICD-10-CM

## 2015-07-17 DIAGNOSIS — Z8679 Personal history of other diseases of the circulatory system: Secondary | ICD-10-CM

## 2015-07-17 DIAGNOSIS — Z79899 Other long term (current) drug therapy: Secondary | ICD-10-CM | POA: Diagnosis not present

## 2015-07-17 DIAGNOSIS — Z953 Presence of xenogenic heart valve: Secondary | ICD-10-CM | POA: Diagnosis not present

## 2015-07-17 DIAGNOSIS — I10 Essential (primary) hypertension: Secondary | ICD-10-CM | POA: Diagnosis not present

## 2015-07-17 DIAGNOSIS — Z7901 Long term (current) use of anticoagulants: Secondary | ICD-10-CM

## 2015-07-17 DIAGNOSIS — I4891 Unspecified atrial fibrillation: Secondary | ICD-10-CM

## 2015-07-17 DIAGNOSIS — Z8542 Personal history of malignant neoplasm of other parts of uterus: Secondary | ICD-10-CM | POA: Diagnosis not present

## 2015-07-17 LAB — POCT INR: INR: 3.7

## 2015-07-18 DIAGNOSIS — J449 Chronic obstructive pulmonary disease, unspecified: Secondary | ICD-10-CM | POA: Diagnosis not present

## 2015-07-18 DIAGNOSIS — I061 Rheumatic aortic insufficiency: Secondary | ICD-10-CM | POA: Diagnosis not present

## 2015-07-18 DIAGNOSIS — J9601 Acute respiratory failure with hypoxia: Secondary | ICD-10-CM | POA: Diagnosis not present

## 2015-07-18 DIAGNOSIS — I480 Paroxysmal atrial fibrillation: Secondary | ICD-10-CM | POA: Diagnosis not present

## 2015-07-18 DIAGNOSIS — E785 Hyperlipidemia, unspecified: Secondary | ICD-10-CM | POA: Diagnosis not present

## 2015-07-18 DIAGNOSIS — J440 Chronic obstructive pulmonary disease with acute lower respiratory infection: Secondary | ICD-10-CM | POA: Diagnosis not present

## 2015-07-18 DIAGNOSIS — I5032 Chronic diastolic (congestive) heart failure: Secondary | ICD-10-CM | POA: Diagnosis not present

## 2015-07-18 DIAGNOSIS — J189 Pneumonia, unspecified organism: Secondary | ICD-10-CM | POA: Diagnosis not present

## 2015-07-18 DIAGNOSIS — N183 Chronic kidney disease, stage 3 (moderate): Secondary | ICD-10-CM | POA: Diagnosis not present

## 2015-07-21 ENCOUNTER — Other Ambulatory Visit: Payer: Self-pay | Admitting: *Deleted

## 2015-07-21 ENCOUNTER — Encounter: Payer: Self-pay | Admitting: *Deleted

## 2015-07-21 DIAGNOSIS — S81811A Laceration without foreign body, right lower leg, initial encounter: Secondary | ICD-10-CM | POA: Diagnosis not present

## 2015-07-21 NOTE — Patient Outreach (Signed)
[facility]) Care Management   07/21/2015  [name] [name] [birth year] [id number]  [name] [name] is an 75 y.o. female  Subjective: Routine home visit with pt, HIPAA verified, pt reports she cut her leg on car door and went to ED last Thursday and "had it glued back together and I'm seeing Dr. Quintin Alto today and he'll take the bandage off and make sure everything looks okay"  Pt reports she continues to weigh daily and take medication as prescribed, pt states home health continues to see her.  Objective:   Filed Vitals:   07/21/15 1256  BP: 110/60  Pulse: 76  Resp: 18  Weight: 154 lb 3.2 oz (69.945 kg)  SpO2: 93%   ROS  Physical Exam  Constitutional: She is oriented to person, place, and time. She appears well-nourished.  HENT:  Head: Normocephalic.  Neck: Normal range of motion. Neck supple.  Cardiovascular: Regular rhythm.   Respiratory: Breath sounds normal.  GI: Soft. Bowel sounds are normal.  Musculoskeletal: Normal range of motion. She exhibits edema.  1-2+ edema lower extremities bil- legs feel hard, tight  Neurological: She is alert and oriented to person, place, and time.  Skin: Skin is warm and dry.  Psychiatric: She has a normal mood and affect. Her behavior is normal. Judgment and thought content normal.    Encounter Medications:   Outpatient Encounter Prescriptions as of 07/21/2015  Medication Sig  . amiodarone (PACERONE) 200 MG tablet Take 0.5 tablets (100 mg total) by mouth daily.  . colchicine 0.6 MG tablet Take 0.5 tablets (0.3 mg total) by mouth daily.  . ferrous sulfate 325 (65 FE) MG tablet Take 325 mg by mouth 2 (two) times daily with a meal.  . guaiFENesin (MUCINEX) 600 MG 12 hr tablet Take 1 tablet (600 mg total) by mouth 2 (two) times daily.  Marland Kitchen levothyroxine (SYNTHROID, LEVOTHROID) 50 MCG tablet Take 50 mcg by mouth daily before breakfast.  . metolazone (ZAROXOLYN) 2.5 MG tablet Take 1 tablet (2.5 mg total) by mouth as  directed. TAKE 1 TABLET AS NEEDED ONLY IF YOUR WEIGHT INCREASES 2-3 POUNDS IN 24 HOURS. (Patient taking differently: Take 2.5 mg by mouth daily. Marland Kitchen)  . omeprazole (PRILOSEC) 20 MG capsule Take 1 capsule (20 mg total) by mouth daily. Medicine to help protect your stomach lining from ulcers while on coumadin  . potassium chloride SA (K-DUR,KLOR-CON) 20 MEQ tablet Take 1 tablet (20 mEq total) by mouth daily.  . simvastatin (ZOCOR) 10 MG tablet TAKE ONE TABLET BY MOUTH EVERY EVENING.  Marland Kitchen tolterodine (DETROL LA) 4 MG 24 hr capsule Take 4 mg by mouth daily.  Marland Kitchen torsemide (DEMADEX) 100 MG tablet TAKE ONE TABLET BY MOUTH EVERY MORNING  . warfarin (COUMADIN) 2.5 MG tablet Take 2.5 mg by mouth daily. At 1700  . [DISCONTINUED] amoxicillin-clavulanate (AUGMENTIN) 875-125 MG tablet Take 1 tablet by mouth 2 (two) times daily.   No facility-administered encounter medications on file as of 07/21/2015.    Functional Status:   In your present state of health, do you have any difficulty performing the following activities: 07/02/2015 06/24/2015  Hearing? N N  Vision? N N  Difficulty concentrating or making decisions? N N  Walking or climbing stairs? Y Y  Dressing or bathing? Y N  Doing errands, shopping? [name]  Preparing Food and eating ? N -  Using the Toilet? N -  In the past six months, have you accidently leaked urine? Y -  Do you have problems  with loss of bowel control? N -  Managing your Medications? Y -  Managing your Finances? N -  Housekeeping or managing your Housekeeping? Y -    Fall/Depression Screening:    PHQ 2/9 Scores 07/02/2015 11/15/2014 08/19/2014 06/07/2014  PHQ - 2 Score 0 0 0 0    Assessment:  Pt to see primary care MD today and has on bandage she was told not to remove. Weight range 151-154 pounds, RN CM continues to focus on CHF management, pneumonia teaching.  [facility] CM Care Plan Problem One        Most Recent Value   Care Plan Problem One  Knowledge deficity related to CHF disease  process   Role Documenting the Problem One  Care Management Coordinator   Care Plan for Problem One  Active   THN Long Term Goal (31-90 days)  pt will have no admissions related to heart failure or pneumonia within 90 days.   [facility]N Long Term Goal Start Date  06/30/15   Interventions for Problem One Long Term Goal  RN CM reviewed CHF zones/ action plan   [facility] CM Short Term Goal #1 (0-30 days)  pt will follow up with primary care MD on 07/09/15   [facility] CM Short Term Goal #1 Start Date  06/30/15   [facility] CM Short Term Goal #1 Met Date  07/21/15    [facility] CM Care Plan Problem Two        Most Recent Value   Care Plan Problem Two  Knowledge deficit related to pneumonia (recognizing symptoms, causes)   Role Documenting the Problem Two  Care Management Coordinator   Care Plan for Problem Two  Active   [facility] CM Short Term Goal #1 (0-30 days)  Pt will verbalize early warning signs and symptom management related to pneumonia   [facility] CM Short Term Goal #1 Start Date  07/02/15   Interventions for Short Term Goal #2   RN CM reinforced signs/ symptoms pneumonia exacerbation    [facility]N CM Care Plan Problem Three        Most Recent Value   Care Plan Problem Three  Pt has new wound to RLE   Role Documenting the Problem Three  Care Management Coordinator   Care Plan for Problem Three  Active   [facility]N CM Short Term Goal #1 (0-30 days)  Pt will see primary care MD 07/21/15 regarding wound RLE and follow instructions for proper wound healing within 30 days   [facility] CM Short Term Goal #1 Start Date  07/21/15   Interventions for Short Term Goal #1  RN CM reviewed signs/ symptoms infection and proper diet with adequate protein for wound healing      Plan: follow up with home visit 08/19/15 Assess weight and medications Wound RLE  [name] [facility], [place] Coordinator [phone]

## 2015-07-22 DIAGNOSIS — I509 Heart failure, unspecified: Secondary | ICD-10-CM | POA: Diagnosis not present

## 2015-07-28 DIAGNOSIS — J9601 Acute respiratory failure with hypoxia: Secondary | ICD-10-CM | POA: Diagnosis not present

## 2015-07-28 DIAGNOSIS — J189 Pneumonia, unspecified organism: Secondary | ICD-10-CM | POA: Diagnosis not present

## 2015-07-28 DIAGNOSIS — I10 Essential (primary) hypertension: Secondary | ICD-10-CM | POA: Diagnosis not present

## 2015-07-28 DIAGNOSIS — E785 Hyperlipidemia, unspecified: Secondary | ICD-10-CM | POA: Diagnosis not present

## 2015-07-28 DIAGNOSIS — N183 Chronic kidney disease, stage 3 (moderate): Secondary | ICD-10-CM | POA: Diagnosis not present

## 2015-07-28 DIAGNOSIS — J158 Pneumonia due to other specified bacteria: Secondary | ICD-10-CM | POA: Diagnosis not present

## 2015-07-28 DIAGNOSIS — I5022 Chronic systolic (congestive) heart failure: Secondary | ICD-10-CM | POA: Diagnosis not present

## 2015-07-28 DIAGNOSIS — I061 Rheumatic aortic insufficiency: Secondary | ICD-10-CM | POA: Diagnosis not present

## 2015-07-28 DIAGNOSIS — I482 Chronic atrial fibrillation: Secondary | ICD-10-CM | POA: Diagnosis not present

## 2015-07-28 DIAGNOSIS — I5032 Chronic diastolic (congestive) heart failure: Secondary | ICD-10-CM | POA: Diagnosis not present

## 2015-07-28 DIAGNOSIS — J449 Chronic obstructive pulmonary disease, unspecified: Secondary | ICD-10-CM | POA: Diagnosis not present

## 2015-07-28 DIAGNOSIS — I480 Paroxysmal atrial fibrillation: Secondary | ICD-10-CM | POA: Diagnosis not present

## 2015-07-28 DIAGNOSIS — J440 Chronic obstructive pulmonary disease with acute lower respiratory infection: Secondary | ICD-10-CM | POA: Diagnosis not present

## 2015-07-30 DIAGNOSIS — I482 Chronic atrial fibrillation: Secondary | ICD-10-CM | POA: Diagnosis not present

## 2015-07-30 DIAGNOSIS — E039 Hypothyroidism, unspecified: Secondary | ICD-10-CM | POA: Diagnosis not present

## 2015-07-30 DIAGNOSIS — D649 Anemia, unspecified: Secondary | ICD-10-CM | POA: Diagnosis not present

## 2015-07-30 DIAGNOSIS — J158 Pneumonia due to other specified bacteria: Secondary | ICD-10-CM | POA: Diagnosis not present

## 2015-07-30 DIAGNOSIS — I10 Essential (primary) hypertension: Secondary | ICD-10-CM | POA: Diagnosis not present

## 2015-07-30 DIAGNOSIS — E7800 Pure hypercholesterolemia, unspecified: Secondary | ICD-10-CM | POA: Diagnosis not present

## 2015-07-30 DIAGNOSIS — N184 Chronic kidney disease, stage 4 (severe): Secondary | ICD-10-CM | POA: Diagnosis not present

## 2015-08-05 ENCOUNTER — Ambulatory Visit (INDEPENDENT_AMBULATORY_CARE_PROVIDER_SITE_OTHER): Payer: Commercial Managed Care - HMO | Admitting: *Deleted

## 2015-08-05 DIAGNOSIS — Z9889 Other specified postprocedural states: Secondary | ICD-10-CM

## 2015-08-05 DIAGNOSIS — Z7901 Long term (current) use of anticoagulants: Secondary | ICD-10-CM

## 2015-08-05 DIAGNOSIS — I5033 Acute on chronic diastolic (congestive) heart failure: Secondary | ICD-10-CM | POA: Diagnosis not present

## 2015-08-05 DIAGNOSIS — I4891 Unspecified atrial fibrillation: Secondary | ICD-10-CM

## 2015-08-05 DIAGNOSIS — Z953 Presence of xenogenic heart valve: Secondary | ICD-10-CM

## 2015-08-05 DIAGNOSIS — Z8679 Personal history of other diseases of the circulatory system: Secondary | ICD-10-CM

## 2015-08-05 DIAGNOSIS — I5043 Acute on chronic combined systolic (congestive) and diastolic (congestive) heart failure: Secondary | ICD-10-CM | POA: Diagnosis not present

## 2015-08-05 LAB — POCT INR: INR: 1.9

## 2015-08-06 DIAGNOSIS — N184 Chronic kidney disease, stage 4 (severe): Secondary | ICD-10-CM | POA: Diagnosis not present

## 2015-08-06 DIAGNOSIS — I10 Essential (primary) hypertension: Secondary | ICD-10-CM | POA: Diagnosis not present

## 2015-08-06 DIAGNOSIS — I482 Chronic atrial fibrillation: Secondary | ICD-10-CM | POA: Diagnosis not present

## 2015-08-06 DIAGNOSIS — R531 Weakness: Secondary | ICD-10-CM | POA: Diagnosis not present

## 2015-08-06 DIAGNOSIS — E876 Hypokalemia: Secondary | ICD-10-CM | POA: Diagnosis not present

## 2015-08-06 DIAGNOSIS — R5383 Other fatigue: Secondary | ICD-10-CM | POA: Diagnosis not present

## 2015-08-06 DIAGNOSIS — I5022 Chronic systolic (congestive) heart failure: Secondary | ICD-10-CM | POA: Diagnosis not present

## 2015-08-06 DIAGNOSIS — D649 Anemia, unspecified: Secondary | ICD-10-CM | POA: Diagnosis not present

## 2015-08-13 DIAGNOSIS — I061 Rheumatic aortic insufficiency: Secondary | ICD-10-CM | POA: Diagnosis not present

## 2015-08-13 DIAGNOSIS — I480 Paroxysmal atrial fibrillation: Secondary | ICD-10-CM | POA: Diagnosis not present

## 2015-08-13 DIAGNOSIS — J440 Chronic obstructive pulmonary disease with acute lower respiratory infection: Secondary | ICD-10-CM | POA: Diagnosis not present

## 2015-08-13 DIAGNOSIS — E785 Hyperlipidemia, unspecified: Secondary | ICD-10-CM | POA: Diagnosis not present

## 2015-08-13 DIAGNOSIS — J9601 Acute respiratory failure with hypoxia: Secondary | ICD-10-CM | POA: Diagnosis not present

## 2015-08-13 DIAGNOSIS — I5032 Chronic diastolic (congestive) heart failure: Secondary | ICD-10-CM | POA: Diagnosis not present

## 2015-08-13 DIAGNOSIS — J449 Chronic obstructive pulmonary disease, unspecified: Secondary | ICD-10-CM | POA: Diagnosis not present

## 2015-08-13 DIAGNOSIS — N183 Chronic kidney disease, stage 3 (moderate): Secondary | ICD-10-CM | POA: Diagnosis not present

## 2015-08-13 DIAGNOSIS — J189 Pneumonia, unspecified organism: Secondary | ICD-10-CM | POA: Diagnosis not present

## 2015-08-19 ENCOUNTER — Encounter: Payer: Self-pay | Admitting: *Deleted

## 2015-08-19 ENCOUNTER — Other Ambulatory Visit: Payer: Self-pay | Admitting: *Deleted

## 2015-08-19 NOTE — Patient Outreach (Signed)
[facility]) Care Management   08/19/2015  ARACELLI WOLOSZYN 12-16-1940 [id number]  KENZLEIGH SEDAM is an 75 y.o. female  Subjective: RN CM called pt to remind her of today's scheduled home visit and pt states she will not be home at all today, will be gone with her daughter, pt prefers telephonic assessment.  Pt reports she is eating well, weight 157 pounds, has gained a few pounds over the course of one month.  Pt reports home health continues to see her. Pt reports " I don't have any swelling" , states " no medicine changes"  Pt saw primary MD on 07/21/15 and to follow up in 4 months, pt reports wound to right lower extremity is healed.  Objective:   Filed Vitals:   08/19/15 1531  Weight: 157 lb (71.215 kg)   ROS  Physical Exam  Encounter Medications:   Outpatient Encounter Prescriptions as of 08/19/2015  Medication Sig  . amiodarone (PACERONE) 200 MG tablet Take 0.5 tablets (100 mg total) by mouth daily.  . colchicine 0.6 MG tablet Take 0.5 tablets (0.3 mg total) by mouth daily.  . ferrous sulfate 325 (65 FE) MG tablet Take 325 mg by mouth 2 (two) times daily with a meal.  . guaiFENesin (MUCINEX) 600 MG 12 hr tablet Take 1 tablet (600 mg total) by mouth 2 (two) times daily.  Marland Kitchen levothyroxine (SYNTHROID, LEVOTHROID) 50 MCG tablet Take 50 mcg by mouth daily before breakfast.  . metolazone (ZAROXOLYN) 2.5 MG tablet Take 1 tablet (2.5 mg total) by mouth as directed. TAKE 1 TABLET AS NEEDED ONLY IF YOUR WEIGHT INCREASES 2-3 POUNDS IN 24 HOURS. (Patient taking differently: Take 2.5 mg by mouth daily. Marland Kitchen)  . omeprazole (PRILOSEC) 20 MG capsule Take 1 capsule (20 mg total) by mouth daily. Medicine to help protect your stomach lining from ulcers while on coumadin  . potassium chloride SA (K-DUR,KLOR-CON) 20 MEQ tablet Take 1 tablet (20 mEq total) by mouth daily.  . simvastatin (ZOCOR) 10 MG tablet TAKE ONE TABLET BY MOUTH EVERY EVENING.  Marland Kitchen tolterodine (DETROL LA) 4 MG  24 hr capsule Take 4 mg by mouth daily.  Marland Kitchen torsemide (DEMADEX) 100 MG tablet TAKE ONE TABLET BY MOUTH EVERY MORNING  . warfarin (COUMADIN) 2.5 MG tablet Take 2.5 mg by mouth daily. At 1700   No facility-administered encounter medications on file as of 08/19/2015.    Functional Status:   In your present state of health, do you have any difficulty performing the following activities: 07/02/2015 06/24/2015  Hearing? N N  Vision? N N  Difficulty concentrating or making decisions? N N  Walking or climbing stairs? Y Y  Dressing or bathing? Y N  Doing errands, shopping? [name]  Preparing Food and eating ? N -  Using the Toilet? N -  In the past six months, have you accidently leaked urine? Y -  Do you have problems with loss of bowel control? N -  Managing your Medications? Y -  Managing your Finances? N -  Housekeeping or managing your Housekeeping? Y -    Fall/Depression Screening:    PHQ 2/9 Scores 07/02/2015 11/15/2014 08/19/2014 06/07/2014  PHQ - 2 Score 0 0 0 0    Assessment:  Pt managing well at home with support of home health.  RN CM discussed discharge plan and tentative transfer to RN health coach at next visit.  [facility] CM Care Plan Problem One        Most Recent Value  Care Plan Problem One  Knowledge deficity related to CHF disease process   Role Documenting the Problem One  Care Management Coordinator   Care Plan for Problem One  Active   [facility]N Long Term Goal (31-90 days)  pt will have no admissions related to heart failure or pneumonia within 90 days.   THN Long Term Goal Start Date  06/30/15   Interventions for Problem One Long Term Goal  RN CM reminded pt of  CHF zones/ action plan   [facility] CM Short Term Goal #1 (0-30 days)  pt will call MD for any change in condition, health status, weight gain related to CHF action plan within 30 days.   [facility] CM Short Term Goal #1 Start Date  08/19/15   Interventions for Short Term Goal #1  RN CM reviewed importance of callaing MD early for change in  health status, receiving early treatment.    [facility]N CM Care Plan Problem Two        Most Recent Value   Care Plan Problem Two  Knowledge deficit related to pneumonia (recognizing symptoms, causes)   Role Documenting the Problem Two  Care Management Coordinator   Care Plan for Problem Two  Active   [facility] CM Short Term Goal #1 (0-30 days)  Pt will verbalize early warning signs and symptom management related to pneumonia   [facility] CM Short Term Goal #1 Start Date  07/02/15   [facility] CM Short Term Goal #1 Met Date   08/19/15 [goal met]   Interventions for Short Term Goal #2   RN CM reiterated signs/ symptoms pneumonia exacerbation    [facility] CM Care Plan Problem Three        Most Recent Value   Care Plan Problem Three  Pt has new wound to RLE   Role Documenting the Problem Three  Care Management Coordinator   Care Plan for Problem Three  Active   [facility] CM Short Term Goal #1 (0-30 days)  Pt will see primary care MD 07/21/15 regarding wound RLE and follow instructions for proper wound healing within 30 days   [facility]N CM Short Term Goal #1 Start Date  07/21/15   [facility] CM Short Term Goal #1 Met Date  08/19/15 [pt saw primary care and reports wound is healed]   Interventions for Short Term Goal #1  RN CM reinforced signs/ symptoms infection       Plan: follow up with home visit 09/12/15 Assess weight, medications  Julie Farmer RNC, BSN [facility] Community Care Coordinator [phone]        

## 2015-08-22 DIAGNOSIS — I509 Heart failure, unspecified: Secondary | ICD-10-CM | POA: Diagnosis not present

## 2015-08-26 ENCOUNTER — Ambulatory Visit: Payer: Commercial Managed Care - HMO | Admitting: Cardiology

## 2015-08-27 DIAGNOSIS — I480 Paroxysmal atrial fibrillation: Secondary | ICD-10-CM | POA: Diagnosis not present

## 2015-08-27 DIAGNOSIS — J9601 Acute respiratory failure with hypoxia: Secondary | ICD-10-CM | POA: Diagnosis not present

## 2015-08-27 DIAGNOSIS — E785 Hyperlipidemia, unspecified: Secondary | ICD-10-CM | POA: Diagnosis not present

## 2015-08-27 DIAGNOSIS — I061 Rheumatic aortic insufficiency: Secondary | ICD-10-CM | POA: Diagnosis not present

## 2015-08-27 DIAGNOSIS — J449 Chronic obstructive pulmonary disease, unspecified: Secondary | ICD-10-CM | POA: Diagnosis not present

## 2015-08-27 DIAGNOSIS — J189 Pneumonia, unspecified organism: Secondary | ICD-10-CM | POA: Diagnosis not present

## 2015-08-27 DIAGNOSIS — N183 Chronic kidney disease, stage 3 (moderate): Secondary | ICD-10-CM | POA: Diagnosis not present

## 2015-08-27 DIAGNOSIS — I5032 Chronic diastolic (congestive) heart failure: Secondary | ICD-10-CM | POA: Diagnosis not present

## 2015-08-27 DIAGNOSIS — J440 Chronic obstructive pulmonary disease with acute lower respiratory infection: Secondary | ICD-10-CM | POA: Diagnosis not present

## 2015-08-29 ENCOUNTER — Encounter: Payer: Self-pay | Admitting: *Deleted

## 2015-08-29 ENCOUNTER — Ambulatory Visit (INDEPENDENT_AMBULATORY_CARE_PROVIDER_SITE_OTHER): Payer: Commercial Managed Care - HMO | Admitting: Cardiology

## 2015-08-29 VITALS — BP 149/72 | HR 72 | Ht 62.0 in | Wt 157.0 lb

## 2015-08-29 DIAGNOSIS — I5022 Chronic systolic (congestive) heart failure: Secondary | ICD-10-CM | POA: Diagnosis not present

## 2015-08-29 DIAGNOSIS — Z954 Presence of other heart-valve replacement: Secondary | ICD-10-CM | POA: Diagnosis not present

## 2015-08-29 DIAGNOSIS — I340 Nonrheumatic mitral (valve) insufficiency: Secondary | ICD-10-CM | POA: Diagnosis not present

## 2015-08-29 DIAGNOSIS — I4891 Unspecified atrial fibrillation: Secondary | ICD-10-CM

## 2015-08-29 DIAGNOSIS — N184 Chronic kidney disease, stage 4 (severe): Secondary | ICD-10-CM

## 2015-08-29 DIAGNOSIS — Z952 Presence of prosthetic heart valve: Secondary | ICD-10-CM

## 2015-08-29 NOTE — Patient Instructions (Signed)
Your physician wants you to follow-up in: 4 MONTHS WITH DR BRANCH You will receive a reminder letter in the mail two months in advance. If you don't receive a letter, please call our office to schedule the follow-up appointment.  Your physician recommends that you continue on your current medications as directed. Please refer to the Current Medication list given to you today.  Thank you for choosing  [facility]!!   

## 2015-08-29 NOTE — Progress Notes (Signed)
Clinical Summary Ms. Bankes is a 75 y.o.female seen today for follow up of the following medical problems.   1. Mitral regurgitation  - hx of rheumatic fever, developed severe MR  - MVR (27mm Edwards Magna Mitral bovine bioprosthetic tissue valve) 05/16/2013 with combined MAZE procedure  - echo 06/05/13 with LVEF 40-45%, normal functioning MVR  - echo 05/2014 LVEF 50-55%, normal functioning MVR, mild AS, mod MR   - no SOB or DOE. Denies any LE edema. Home weights at home 154 and stable.  - compliant with meds    2. Combined Systolic/ diastolic heart failure  - echo last year showed mildly LV systolic dysfunction, repeat 05/2014 shows low normal function at 50-55%. Abnormal diastolic function, indeterminate grade.  - she is not on ACE-I due to CKD   - compliant with meds. Limiting sodium intake. Weights stable at 154 lbs    3. Paroxysmal afib  - MAZE procedure during MVR 05/16/2013  - after Maze procedure episode of aflutter, underwent DCCV 06/06/13 for aflutter, remains on amio and coumadin.  - . Has had some troubles with nosebleeds requiring cautery by ENT in the past. Most recently see in ER 06/2014 with nose bleed, INR was 1.92 at that time.   - denies any palpitations since last visit.  - denies any bleeding issues on anticoag  4. CKD Stage IV  5. HTN - has not taken meds yet today.  - does not check bp regularly at home  Past Medical History  Diagnosis Date  . COPD (chronic obstructive pulmonary disease) ([place])   . History of TIA (transient ischemic attack)     Diagnosed 2003  . History of stroke     2000 [place]  . Obesity   . Juvenile rheumatic fever     Age 12  . Essential hypertension, benign   . Hyperlipidemia   . Mitral regurgitation     Status post bioprosthetic MVR (05/2013)  . CKD (chronic kidney disease) stage 4, GFR 15-29 ml/min (HCC)   . Paroxysmal atrial fibrillation (HCC)     s/p MAZE (05/2013)  . Chronic diastolic congestive heart  failure ([place])     a) ECHO (09/2013) EF 50-55%  . Arthritis   . Aortic insufficiency due to bicuspid aortic valve     Moderate by TEE  . 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 S/P Maze operation for atrial fibrillation 05/16/2013    Complete bilateral atrial lesion set using cryothermy and bipolar radiofrequency ablation with oversewing of LA appendage  . NICM (nonischemic cardiomyopathy) ([place])     a) LHC (04/2013) no significant CAD     Allergies  Allergen Reactions  . Indomethacin Other (See Comments)    dizziness  . Norvasc [Amlodipine Besylate] Cough     Current Outpatient Prescriptions  Medication Sig Dispense Refill  . amiodarone (PACERONE) 200 MG tablet Take 0.5 tablets (100 mg total) by mouth daily. 30 tablet 6  . colchicine 0.6 MG tablet Take 0.5 tablets (0.3 mg total) by mouth daily. 30 tablet 0  . ferrous sulfate 325 (65 FE) MG tablet Take 325 mg by mouth 2 (two) times daily with a meal.    . guaiFENesin (MUCINEX) 600 MG 12 hr tablet Take 1 tablet (600 mg total) by mouth 2 (two) times daily. 30 tablet 1  . levothyroxine (SYNTHROID, LEVOTHROID) 50  MCG tablet Take 50 mcg by mouth daily before breakfast.    . metolazone (ZAROXOLYN) 2.5 MG tablet Take 1 tablet (2.5 mg total) by mouth as directed. TAKE 1 TABLET AS NEEDED ONLY IF YOUR WEIGHT INCREASES 2-3 POUNDS IN 24 HOURS. (Patient taking differently: Take 2.5 mg by mouth daily. Marland Kitchen)    . omeprazole (PRILOSEC) 20 MG capsule Take 1 capsule (20 mg total) by mouth daily. Medicine to help protect your stomach lining from ulcers while on coumadin 30 capsule 3  . potassium chloride SA (K-DUR,KLOR-CON) 20 MEQ tablet Take 1 tablet (20 mEq total) by mouth daily. 60 tablet 6  . simvastatin (ZOCOR) 10 MG tablet TAKE ONE TABLET BY MOUTH EVERY EVENING. 30 tablet 3  . tolterodine (DETROL  LA) 4 MG 24 hr capsule Take 4 mg by mouth daily.    Marland Kitchen torsemide (DEMADEX) 100 MG tablet TAKE ONE TABLET BY MOUTH EVERY MORNING 90 tablet 3  . warfarin (COUMADIN) 2.5 MG tablet Take 2.5 mg by mouth daily. At 1700     No current facility-administered medications for this visit.     Past Surgical History  Procedure Laterality Date  . Total knee arthroplasty Right   . Abdominal hysterectomy      Cervical Cancer  . Parathyroid/thyroid surgery      Tumor  . Tee without cardioversion N/A 04/06/2013    Procedure: TRANSESOPHAGEAL ECHOCARDIOGRAM (TEE);  Surgeon: [name], MD;  [facility];  Service: Cardiology;  Laterality: N/A;  . Minimally invasive maze procedure N/A 05/16/2013    Procedure: MINIMALLY INVASIVE MAZE PROCEDURE;  Surgeon: Rexene Alberts, MD;  Location: [place];  Service: Open Heart Surgery;  Laterality: N/A;  . Intraoperative transesophageal echocardiogram N/A 05/16/2013    Procedure: INTRAOPERATIVE TRANSESOPHAGEAL ECHOCARDIOGRAM;  Surgeon: Rexene Alberts, MD;  Location: [place];  Service: Open Heart Surgery;  Laterality: N/A;  . Mitral valve replacement Right 05/16/2013    Procedure: MINIMALLY INVASIVE MITRAL VALVE (MV) REPLACEMENT;  Surgeon: Rexene Alberts, MD;  Location: [place];  Service: Open Heart Surgery;  Laterality: Right;  . Tee without cardioversion N/A 06/06/2013    Procedure: TRANSESOPHAGEAL ECHOCARDIOGRAM (TEE);  Surgeon: Larey Dresser, MD;  Location: [place];  Service: Cardiovascular;  Laterality: N/A;  . Cardioversion N/A 06/06/2013    Procedure: CARDIOVERSION;  Surgeon: Larey Dresser, MD;  Location[facility];  Service: Cardiovascular;  Laterality: N/A;  . Left and right heart catheterization with coronary angiogram N/A 05/04/2013    Procedure: LEFT AND RIGHT HEART CATHETERIZATION WITH CORONARY ANGIOGRAM;  Surgeon: Jettie Booze, MD;  Location: [facility] CATH LAB;  Service: Cardiovascular;  Laterality: N/A;     Allergies  Allergen Reactions    . Indomethacin Other (See Comments)    dizziness  . Norvasc [Amlodipine Besylate] Cough      Family History  Problem Relation Age of Onset  . Heart failure Father   . Heart attack Brother      Social History Ms. Poppert reports that she has never smoked. She has never used smokeless tobacco. Ms. Peron reports that she does not drink alcohol.   Review of Systems CONSTITUTIONAL: No weight loss, fever, chills, weakness or fatigue.  HEENT: Eyes: No visual loss, blurred vision, double vision or yellow sclerae.No hearing loss, sneezing, congestion, runny nose or sore throat.  SKIN: No rash or itching.  CARDIOVASCULAR: per HPI RESPIRATORY: No shortness of breath, cough or sputum.  GASTROINTESTINAL: No anorexia, nausea, vomiting or diarrhea. No abdominal pain  or blood.  GENITOURINARY: No burning on urination, no polyuria NEUROLOGICAL: No headache, dizziness, syncope, paralysis, ataxia, numbness or tingling in the extremities. No change in bowel or bladder control.  MUSCULOSKELETAL: No muscle, back pain, joint pain or stiffness.  LYMPHATICS: No enlarged nodes. No history of splenectomy.  PSYCHIATRIC: No history of depression or anxiety.  ENDOCRINOLOGIC: No reports of sweating, cold or heat intolerance. No polyuria or polydipsia.  Marland Kitchen   Physical Examination Filed Vitals:   08/29/15 1038  BP: 149/72  Pulse: 72   Filed Vitals:   08/29/15 1038  Height: 5\' 2"  (1.575 m)  Weight: 157 lb (71.215 kg)    Gen: resting comfortably, no acute distress HEENT: no scleral icterus, pupils equal round and reactive, no palptable cervical adenopathy,  CV: RRR, 2/6 systolic murmur RUSB, no jvd Resp: Clear to auscultation bilaterally GI: abdomen is soft, non-tender, non-distended, normal bowel sounds, no hepatosplenomegaly MSK: extremities are warm, no edema.  Skin: warm, no rash Neuro:  no focal deficits Psych: appropriate affect   Diagnostic Studies 06/05/13 Echo  Procedure  narrative: Transthoracic echocardiography. Poor endocardial definition. Intravenous contrast (Definity) was administered. - Left ventricle: The cavity size was normal. Wall thickness was normal. Systolic function was mildly to moderately reduced. The estimated ejection fraction was in the range of 40% to 45%. LV diastolic function cannot be assessed due to the prosthetic mitral valve. - Aortic valve: Poorly visualized. Mildly calcified leaflets. Transvalvular velocity was minimally increased. Mild regurgitation. - Mitral valve: Bioprosthetic mitral valve. Leaflets not well-visualized. Appears to be stable. Peak and mean gradients of 14 and 6 mmHg across the valve. Trivial regurgitation. Valve area by continuity equation (using LVOT flow): 1.33cm^2. - Right ventricle: The cavity size was mildly dilated. Systolic function is mildly reduced. - Right atrium: The atrium was normal in size. - Tricuspid valve: Mild regurgitation. - Pulmonary arteries: PA peak pressure: 75mm Hg (S). - Inferior vena cava: The vessel was dilated; the respirophasic diameter changes were blunted (< 50%); findings are consistent with elevated central venous pressure. - Pericardium, extracardiac: There was no pericardial effusion.  06/06/13 TEE  Study Conclusions  - Left ventricle: The cavity size was normal. Wall thickness was increased in a pattern of mild LVH. The estimated ejection fraction was 45%. Diffuse hypokinesis. - Aortic valve: Bicuspid. There was no stenosis. Mild to moderate regurgitation. - Mitral valve: Bioprosthetic mitral valve with trivial regurgitation and no significant stenosis. Pressure half-time: 65ms. - Left atrium: Ligated at prior surgery but there was still some flow into the appendage. No thrombus noted in appendage. The atrium was mildly dilated. - Right ventricle: The cavity size was normal. Systolic function was mildly reduced. - Right atrium: No evidence of thrombus in  the atrial cavity or appendage. - Tricuspid valve: Moderate regurgitation. Peak RV-RA gradient: 46mm Hg (S). Impressions:  - May proceed to [place].   05/2014 echo Study Conclusions  - Left ventricle: The cavity size was normal. There was mild concentric hypertrophy. Systolic function was normal. The estimated ejection fraction was in the range of 50% to 55%. Images were inadequate for LV wall motion assessment. Diastolic dysfunction, indeterminate grade. - Aortic valve: Mildly calcified annulus. Trileaflet; mildly thickened, mildly calcified leaflets. Mild aortic stenosis. Peak velocity 2.28 m/s. Mean gradient 12 mmHg. There was moderate regurgitation. - Mitral valve: Normally functioning bioprosthetic mitral valve. There was no significant regurgitation. - Left atrium: The atrium was moderately dilated. Volume/bsa, S: 38.2 ml/m^2. - Right ventricle: Systolic function was mildly reduced. - Right  atrium: The atrium was mildly dilated. - Tricuspid valve: There was mild-moderate regurgitation. - Pulmonary arteries: PA peak pressure: 38 mm Hg (S). Mildly elevated pulmonary pressures.    Assessment and Plan  1. Mitral regurigation - s/p MVR with tissue valve. - no current symptoms.  - we will continue current meds  2. Chronic systolic/diastolic heart failure - medical therapy limited due to low blood pressures, not on beta blocker. No ACE due to severe CKD - appears euvolemic, weights stable at home without significant symptoms - continue current meds  3. Afib - no current symptoms.  - Continue amio and coumadin. CHADS2Vasc score of 4 along with afib in setting of prosthetic MV    F/u 4 months  [name], M.D.

## 2015-09-02 ENCOUNTER — Ambulatory Visit (INDEPENDENT_AMBULATORY_CARE_PROVIDER_SITE_OTHER): Payer: Commercial Managed Care - HMO | Admitting: *Deleted

## 2015-09-02 DIAGNOSIS — Z9889 Other specified postprocedural states: Secondary | ICD-10-CM

## 2015-09-02 DIAGNOSIS — Z953 Presence of xenogenic heart valve: Secondary | ICD-10-CM | POA: Diagnosis not present

## 2015-09-02 DIAGNOSIS — Z8679 Personal history of other diseases of the circulatory system: Secondary | ICD-10-CM

## 2015-09-02 DIAGNOSIS — I4891 Unspecified atrial fibrillation: Secondary | ICD-10-CM

## 2015-09-02 DIAGNOSIS — I639 Cerebral infarction, unspecified: Secondary | ICD-10-CM

## 2015-09-02 DIAGNOSIS — I5043 Acute on chronic combined systolic (congestive) and diastolic (congestive) heart failure: Secondary | ICD-10-CM | POA: Diagnosis not present

## 2015-09-02 DIAGNOSIS — Z7901 Long term (current) use of anticoagulants: Secondary | ICD-10-CM | POA: Diagnosis not present

## 2015-09-02 DIAGNOSIS — I5033 Acute on chronic diastolic (congestive) heart failure: Secondary | ICD-10-CM

## 2015-09-02 LAB — POCT INR: INR: 2.3

## 2015-09-12 ENCOUNTER — Encounter: Payer: Self-pay | Admitting: *Deleted

## 2015-09-12 ENCOUNTER — Other Ambulatory Visit: Payer: Self-pay | Admitting: *Deleted

## 2015-09-12 NOTE — Patient Outreach (Signed)
[facility]) Care Management   09/12/2015  [name] [name] 1940/06/11 [id number]  [name] [name] is an 75 y.o. female  Subjective: Routine home visit with pt, HIPAA verified, pt reports she saw primary MD and cardiologist in the past few weeks and to follow up with both in 4 months.  Pt reports " I got good report from everybody, no changes"  Pt states she continues weighing daily, family continues to assist pt, pt goes out for social events such as bingo, church, Social research officer, government.  Objective:   Filed Vitals:   09/12/15 1421  BP: 116/60  Pulse: 62  Resp: 16  Weight: 158 lb (71.668 kg)  SpO2: 98%   ROS  Physical Exam  Constitutional: She is oriented to person, place, and time. She appears well-developed and well-nourished.  HENT:  Head: Normocephalic.  Neck: Normal range of motion. Neck supple.  Cardiovascular: Normal rate and regular rhythm.   Respiratory: Effort normal and breath sounds normal.  GI: Soft. Bowel sounds are normal.  Musculoskeletal: Normal range of motion. She exhibits edema.  1+ edema lower extremities bil.  Neurological: She is alert and oriented to person, place, and time.  Skin: Skin is warm and dry.  Psychiatric: She has a normal mood and affect. Her behavior is normal. Judgment and thought content normal.    Encounter Medications:   Outpatient Encounter Prescriptions as of 09/12/2015  Medication Sig  . amiodarone (PACERONE) 200 MG tablet Take 0.5 tablets (100 mg total) by mouth daily.  . colchicine 0.6 MG tablet Take 0.5 tablets (0.3 mg total) by mouth daily.  . ferrous sulfate 325 (65 FE) MG tablet Take 325 mg by mouth 2 (two) times daily with a meal.  . guaiFENesin (MUCINEX) 600 MG 12 hr tablet Take 1 tablet (600 mg total) by mouth 2 (two) times daily.  Marland Kitchen levothyroxine (SYNTHROID, LEVOTHROID) 50 MCG tablet Take 50 mcg by mouth daily before breakfast.  . metolazone (ZAROXOLYN) 2.5 MG tablet Take 1 tablet (2.5 mg total) by mouth as  directed. TAKE 1 TABLET AS NEEDED ONLY IF YOUR WEIGHT INCREASES 2-3 POUNDS IN 24 HOURS. (Patient taking differently: Take 2.5 mg by mouth daily. Marland Kitchen)  . omeprazole (PRILOSEC) 20 MG capsule Take 1 capsule (20 mg total) by mouth daily. Medicine to help protect your stomach lining from ulcers while on coumadin  . potassium chloride SA (K-DUR,KLOR-CON) 20 MEQ tablet Take 1 tablet (20 mEq total) by mouth daily.  . simvastatin (ZOCOR) 10 MG tablet TAKE ONE TABLET BY MOUTH EVERY EVENING.  Marland Kitchen tolterodine (DETROL LA) 4 MG 24 hr capsule Take 4 mg by mouth daily.  Marland Kitchen torsemide (DEMADEX) 100 MG tablet TAKE ONE TABLET BY MOUTH EVERY MORNING  . warfarin (COUMADIN) 2.5 MG tablet Take 2.5 mg by mouth daily. At 1700   No facility-administered encounter medications on file as of 09/12/2015.    Functional Status:   In your present state of health, do you have any difficulty performing the following activities: 07/02/2015 06/24/2015  Hearing? N N  Vision? N N  Difficulty concentrating or making decisions? N N  Walking or climbing stairs? Y Y  Dressing or bathing? Y N  Doing errands, shopping? [name]  Preparing Food and eating ? N -  Using the Toilet? N -  In the past six months, have you accidently leaked urine? Y -  Do you have problems with loss of bowel control? N -  Managing your Medications? Y -  Managing your Finances? N -  Housekeeping or managing your Housekeeping? Y -    Fall/Depression Screening:    PHQ 2/9 Scores 07/02/2015 11/15/2014 08/19/2014 06/07/2014  PHQ - 2 Score 0 0 0 0    Assessment:  Pt managing well at home with assistance of family, pt agreeable to discharge and declines RN health coach services. RN CM faxed case closure letter to primary MD Dr. Quintin Alto, mailed case closure letter to pt.  [facility] CM Care Plan Problem One        Most Recent Value   Care Plan Problem One  Knowledge deficity related to CHF disease process   Role Documenting the Problem One  Care Management Coordinator   Care Plan  for Problem One  Active   [facility] Long Term Goal (31-90 days)  pt will have no admissions related to heart failure or pneumonia within 90 days.   [facility] Long Term Goal Start Date  06/30/15   [facility] Long Term Goal Met Date  09/12/15   Interventions for Problem One Long Term Goal  RN CM reinforced CHF zones/ action plan   [facility] CM Short Term Goal #1 (0-30 days)  pt will call MD for any change in condition, health status, weight gain related to CHF action plan within 30 days.   [facility] CM Short Term Goal #1 Start Date  08/19/15   [facility] CM Short Term Goal #1 Met Date  09/12/15   Interventions for Short Term Goal #1  RN CM reviewed calling MD early for change in symptoms, health status, importance of action plan    [facility] CM Care Plan Problem Two        Most Recent Value   Care Plan Problem Two  Knowledge deficit related to pneumonia (recognizing symptoms, causes)   Role Documenting the Problem Two  Care Management [place] for Problem Two  Active   [facility]N CM Short Term Goal #1 (0-30 days)  Pt will verbalize early warning signs and symptom management related to pneumonia   [facility] CM Short Term Goal #1 Start Date  07/02/15   [facility] CM Short Term Goal #1 Met Date   08/19/15 Barrie Folk met]    [facility] CM Care Plan Problem Three        Most Recent Value   [facility]N CM Short Term Goal #1 Met Date  -- [pt saw primary care and reports wound is healed]      Plan: Discharge today  [name] [facility], BSN [place] Coordinator [phone]

## 2015-09-21 DIAGNOSIS — I509 Heart failure, unspecified: Secondary | ICD-10-CM | POA: Diagnosis not present

## 2015-09-30 ENCOUNTER — Ambulatory Visit (INDEPENDENT_AMBULATORY_CARE_PROVIDER_SITE_OTHER): Payer: Commercial Managed Care - HMO | Admitting: *Deleted

## 2015-09-30 DIAGNOSIS — Z953 Presence of xenogenic heart valve: Secondary | ICD-10-CM | POA: Diagnosis not present

## 2015-09-30 DIAGNOSIS — Z8679 Personal history of other diseases of the circulatory system: Secondary | ICD-10-CM

## 2015-09-30 DIAGNOSIS — I639 Cerebral infarction, unspecified: Secondary | ICD-10-CM

## 2015-09-30 DIAGNOSIS — I4891 Unspecified atrial fibrillation: Secondary | ICD-10-CM

## 2015-09-30 DIAGNOSIS — Z9889 Other specified postprocedural states: Secondary | ICD-10-CM

## 2015-09-30 DIAGNOSIS — Z7901 Long term (current) use of anticoagulants: Secondary | ICD-10-CM

## 2015-09-30 DIAGNOSIS — I5043 Acute on chronic combined systolic (congestive) and diastolic (congestive) heart failure: Secondary | ICD-10-CM

## 2015-09-30 DIAGNOSIS — I5033 Acute on chronic diastolic (congestive) heart failure: Secondary | ICD-10-CM

## 2015-09-30 LAB — POCT INR: INR: 2.4

## 2015-10-22 ENCOUNTER — Other Ambulatory Visit: Payer: Self-pay | Admitting: Cardiology

## 2015-10-22 DIAGNOSIS — I509 Heart failure, unspecified: Secondary | ICD-10-CM | POA: Diagnosis not present

## 2015-10-29 DIAGNOSIS — R531 Weakness: Secondary | ICD-10-CM | POA: Diagnosis not present

## 2015-11-04 ENCOUNTER — Ambulatory Visit (INDEPENDENT_AMBULATORY_CARE_PROVIDER_SITE_OTHER): Payer: Commercial Managed Care - HMO | Admitting: *Deleted

## 2015-11-04 DIAGNOSIS — Z953 Presence of xenogenic heart valve: Secondary | ICD-10-CM

## 2015-11-04 DIAGNOSIS — I5033 Acute on chronic diastolic (congestive) heart failure: Secondary | ICD-10-CM

## 2015-11-04 DIAGNOSIS — Z7901 Long term (current) use of anticoagulants: Secondary | ICD-10-CM | POA: Diagnosis not present

## 2015-11-04 DIAGNOSIS — Z8679 Personal history of other diseases of the circulatory system: Secondary | ICD-10-CM

## 2015-11-04 DIAGNOSIS — Z9889 Other specified postprocedural states: Secondary | ICD-10-CM | POA: Diagnosis not present

## 2015-11-04 DIAGNOSIS — I5043 Acute on chronic combined systolic (congestive) and diastolic (congestive) heart failure: Secondary | ICD-10-CM | POA: Diagnosis not present

## 2015-11-04 DIAGNOSIS — I639 Cerebral infarction, unspecified: Secondary | ICD-10-CM

## 2015-11-04 DIAGNOSIS — I4891 Unspecified atrial fibrillation: Secondary | ICD-10-CM

## 2015-11-04 LAB — POCT INR: INR: 1.7

## 2015-11-22 DIAGNOSIS — I509 Heart failure, unspecified: Secondary | ICD-10-CM | POA: Diagnosis not present

## 2015-11-25 ENCOUNTER — Ambulatory Visit (INDEPENDENT_AMBULATORY_CARE_PROVIDER_SITE_OTHER): Payer: Commercial Managed Care - HMO | Admitting: *Deleted

## 2015-11-25 DIAGNOSIS — I5043 Acute on chronic combined systolic (congestive) and diastolic (congestive) heart failure: Secondary | ICD-10-CM | POA: Diagnosis not present

## 2015-11-25 DIAGNOSIS — Z9889 Other specified postprocedural states: Secondary | ICD-10-CM

## 2015-11-25 DIAGNOSIS — Z7901 Long term (current) use of anticoagulants: Secondary | ICD-10-CM

## 2015-11-25 DIAGNOSIS — Z953 Presence of xenogenic heart valve: Secondary | ICD-10-CM

## 2015-11-25 DIAGNOSIS — I4891 Unspecified atrial fibrillation: Secondary | ICD-10-CM

## 2015-11-25 DIAGNOSIS — Z8679 Personal history of other diseases of the circulatory system: Secondary | ICD-10-CM

## 2015-11-25 DIAGNOSIS — I5033 Acute on chronic diastolic (congestive) heart failure: Secondary | ICD-10-CM

## 2015-11-25 LAB — POCT INR: INR: 1.6

## 2015-11-29 DIAGNOSIS — R531 Weakness: Secondary | ICD-10-CM | POA: Diagnosis not present

## 2015-12-01 DIAGNOSIS — N184 Chronic kidney disease, stage 4 (severe): Secondary | ICD-10-CM | POA: Diagnosis not present

## 2015-12-01 DIAGNOSIS — E039 Hypothyroidism, unspecified: Secondary | ICD-10-CM | POA: Diagnosis not present

## 2015-12-01 DIAGNOSIS — R739 Hyperglycemia, unspecified: Secondary | ICD-10-CM | POA: Diagnosis not present

## 2015-12-01 DIAGNOSIS — I482 Chronic atrial fibrillation: Secondary | ICD-10-CM | POA: Diagnosis not present

## 2015-12-01 DIAGNOSIS — E7800 Pure hypercholesterolemia, unspecified: Secondary | ICD-10-CM | POA: Diagnosis not present

## 2015-12-01 DIAGNOSIS — I10 Essential (primary) hypertension: Secondary | ICD-10-CM | POA: Diagnosis not present

## 2015-12-01 DIAGNOSIS — D649 Anemia, unspecified: Secondary | ICD-10-CM | POA: Diagnosis not present

## 2015-12-01 DIAGNOSIS — E876 Hypokalemia: Secondary | ICD-10-CM | POA: Diagnosis not present

## 2015-12-04 DIAGNOSIS — E876 Hypokalemia: Secondary | ICD-10-CM | POA: Diagnosis not present

## 2015-12-04 DIAGNOSIS — Z6831 Body mass index (BMI) 31.0-31.9, adult: Secondary | ICD-10-CM | POA: Diagnosis not present

## 2015-12-04 DIAGNOSIS — Z23 Encounter for immunization: Secondary | ICD-10-CM | POA: Diagnosis not present

## 2015-12-04 DIAGNOSIS — I10 Essential (primary) hypertension: Secondary | ICD-10-CM | POA: Diagnosis not present

## 2015-12-04 DIAGNOSIS — R531 Weakness: Secondary | ICD-10-CM | POA: Diagnosis not present

## 2015-12-04 DIAGNOSIS — D649 Anemia, unspecified: Secondary | ICD-10-CM | POA: Diagnosis not present

## 2015-12-04 DIAGNOSIS — I482 Chronic atrial fibrillation: Secondary | ICD-10-CM | POA: Diagnosis not present

## 2015-12-04 DIAGNOSIS — I5022 Chronic systolic (congestive) heart failure: Secondary | ICD-10-CM | POA: Diagnosis not present

## 2015-12-05 ENCOUNTER — Encounter: Payer: Self-pay | Admitting: *Deleted

## 2015-12-08 ENCOUNTER — Ambulatory Visit (INDEPENDENT_AMBULATORY_CARE_PROVIDER_SITE_OTHER): Payer: Commercial Managed Care - HMO | Admitting: Cardiology

## 2015-12-08 VITALS — BP 120/74 | HR 66 | Ht 62.0 in | Wt 158.0 lb

## 2015-12-08 DIAGNOSIS — I4891 Unspecified atrial fibrillation: Secondary | ICD-10-CM | POA: Diagnosis not present

## 2015-12-08 DIAGNOSIS — I5043 Acute on chronic combined systolic (congestive) and diastolic (congestive) heart failure: Secondary | ICD-10-CM

## 2015-12-08 DIAGNOSIS — I340 Nonrheumatic mitral (valve) insufficiency: Secondary | ICD-10-CM

## 2015-12-08 DIAGNOSIS — I10 Essential (primary) hypertension: Secondary | ICD-10-CM

## 2015-12-08 NOTE — Patient Instructions (Signed)
Your physician recommends that you schedule a follow-up appointment in: TO BE DETERMINED AFTER LAB RESULTS  Your physician recommends that you continue on your current medications as directed. Please refer to the Current Medication list given to you today.  Your physician recommends that you return for lab work TOMORROW BMP/MG   Thank you for choosing [facility]!!

## 2015-12-08 NOTE — Progress Notes (Signed)
Clinical Summary [name] [name] is a 75 y.o.female seen today for follow up of the following medical problems.   1. Mitral regurgitation  - hx of rheumatic fever, developed severe MR  - MVR (39mm Edwards Magna Mitral bovine bioprosthetic tissue valve) 05/16/2013 with combined MAZE procedure  - echo 06/05/13 with LVEF 40-45%, normal functioning MVR  - echo 05/2014 LVEF 50-55%, normal functioning MVR, mild AS, mod MR  - she reports recent LE edema    2. Combined Systolic/ diastolic heart failure  - echo last year showed mildly LV systolic dysfunction, repeat 05/2014 shows low normal function at 50-55%. Abnormal diastolic function, indeterminate grade.  - she is not on ACE-I due to CKD   - recent LE edema. started about 2 weeks ago. Gained 10-12 lbs (usually 160 at home, went up to 170 lbs at home). Had some LE edema. No SOB or DOE.  - compliant with meds.  - since Fri has taken 2.5mg  of metolazone daily, stayed on torsemide 100mg  daily per pcp - Cr 2, BUN 30, GFR 24. Cr 1.4 and BUN 19 in May.    3. Paroxysmal afib  - MAZE procedure during MVR 05/16/2013  - after Maze procedure episode of aflutter, underwent DCCV 06/06/13 for aflutter, remains on amio and coumadin.  - . Has had some troubles with nosebleeds requiring cautery by ENT in the past. Most recently see in ER 06/2014 with nose bleed, INR was 1.92 at that time.   - denies any palpitations since last visit.  - denies any bleeding issues on anticoag   4. CKD Stage IV  5. HTN - compliant with meds  Past Medical History:  Diagnosis Date  . Aortic insufficiency due to bicuspid aortic valve    Moderate by TEE  . Arthritis   . Chronic diastolic congestive heart failure ([place])    a) ECHO (09/2013) EF 50-55%  . CKD (chronic kidney disease) stage 4, GFR 15-29 ml/min (HCC)   . COPD (chronic obstructive pulmonary disease) ([place])   . Essential hypertension, benign   . History of stroke    2000 [place]  . History  of TIA (transient ischemic attack)    Diagnosed 2003  . Hyperlipidemia   . Juvenile rheumatic fever    Age 37  . 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 NICM (nonischemic cardiomyopathy) ([place])    a) LHC (04/2013) no significant CAD  . Obesity   . Paroxysmal atrial fibrillation (HCC)    s/p MAZE (05/2013)  . S/P Maze operation for atrial fibrillation 05/16/2013   Complete bilateral atrial lesion set using cryothermy and bipolar radiofrequency ablation with oversewing of LA appendage  . 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 Indomethacin Other (See Comments)    dizziness  . Norvasc [Amlodipine Besylate] Cough     Current Outpatient Prescriptions  Medication Sig Dispense Refill  . amiodarone (PACERONE) 200 MG tablet Take 0.5 tablets (100 mg total) by mouth daily. 30 tablet 6  . colchicine 0.6 MG tablet Take 0.5 tablets (0.3 mg total) by mouth daily. 30 tablet 0  . ferrous sulfate 325 (65 FE) MG tablet Take 325 mg by mouth 2 (two) times daily with a meal.    . guaiFENesin (MUCINEX) 600 MG 12 hr tablet Take 1 tablet (  600 mg total) by mouth 2 (two) times daily. 30 tablet 1  . levothyroxine (SYNTHROID, LEVOTHROID) 50 MCG tablet Take 50 mcg by mouth daily before breakfast.    . metolazone (ZAROXOLYN) 2.5 MG tablet Take 1 tablet (2.5 mg total) by mouth as directed. TAKE 1 TABLET AS NEEDED ONLY IF YOUR WEIGHT INCREASES 2-3 POUNDS IN 24 HOURS. (Patient taking differently: Take 2.5 mg by mouth daily. Marland Kitchen)    . omeprazole (PRILOSEC) 20 MG capsule Take 1 capsule (20 mg total) by mouth daily. Medicine to help protect your stomach lining from ulcers while on coumadin 30 capsule 3  . potassium chloride SA (K-DUR,KLOR-CON) 20 MEQ tablet Take 1 tablet (20 mEq total) by mouth daily. 60 tablet  6  . simvastatin (ZOCOR) 10 MG tablet TAKE ONE TABLET BY MOUTH EVERY EVENING. 30 tablet 3  . tolterodine (DETROL LA) 4 MG 24 hr capsule Take 4 mg by mouth daily.    Marland Kitchen torsemide (DEMADEX) 100 MG tablet TAKE ONE TABLET BY MOUTH EVERY MORNING 90 tablet 3  . warfarin (COUMADIN) 2.5 MG tablet Take 2.5 mg by mouth daily. At 1700     No current facility-administered medications for this visit.      Past Surgical History:  Procedure Laterality Date  . ABDOMINAL HYSTERECTOMY     Cervical Cancer  . CARDIOVERSION N/A 06/06/2013   Procedure: CARDIOVERSION;  Surgeon: Larey Dresser, MD;  Location: [place];  Service: Cardiovascular;  Laterality: N/A;  . INTRAOPERATIVE TRANSESOPHAGEAL ECHOCARDIOGRAM N/A 05/16/2013   Procedure: INTRAOPERATIVE TRANSESOPHAGEAL ECHOCARDIOGRAM;  Surgeon: Rexene Alberts, MD;  Location: [place];  Service: Open Heart Surgery;  Laterality: N/A;  . LEFT AND RIGHT HEART CATHETERIZATION WITH CORONARY ANGIOGRAM N/A 05/04/2013   Procedure: LEFT AND RIGHT HEART CATHETERIZATION WITH CORONARY ANGIOGRAM;  Surgeon: Jettie Booze, MD;  Location: [facility] CATH LAB;  Service: Cardiovascular;  Laterality: N/A;  . MINIMALLY INVASIVE MAZE PROCEDURE N/A 05/16/2013   Procedure: MINIMALLY INVASIVE MAZE PROCEDURE;  Surgeon: Rexene Alberts, MD;  Location: [place];  Service: Open Heart Surgery;  Laterality: N/A;  . MITRAL VALVE REPLACEMENT Right 05/16/2013   Procedure: MINIMALLY INVASIVE MITRAL VALVE (MV) REPLACEMENT;  Surgeon: Rexene Alberts, MD;  Location: [place];  Service: Open Heart Surgery;  Laterality: Right;  . Parathyroid/Thyroid surgery     Tumor  . TEE WITHOUT CARDIOVERSION N/A 04/06/2013   Procedure: TRANSESOPHAGEAL ECHOCARDIOGRAM (TEE);  Surgeon: [name], MD;  [facility];  Service: Cardiology;  Laterality: N/A;  . TEE WITHOUT CARDIOVERSION N/A 06/06/2013   Procedure: TRANSESOPHAGEAL ECHOCARDIOGRAM (TEE);  Surgeon: Larey Dresser, MD;  Location: [place];  Service:  Cardiovascular;  Laterality: N/A;  . TOTAL KNEE ARTHROPLASTY Right      Allergies  Allergen Reactions  . Indomethacin Other (See Comments)    dizziness  . Norvasc [Amlodipine Besylate] Cough      Family History  Problem Relation Age of Onset  . Heart failure Father   . Heart attack Brother      Social History Ms. Rannow reports that she has never smoked. She has never used smokeless tobacco. Ms. Ericsson reports that she does not drink alcohol.   Review of Systems CONSTITUTIONAL: No weight loss, fever, chills, weakness or fatigue.  HEENT: Eyes: No visual loss, blurred vision, double vision or yellow sclerae.No hearing loss, sneezing, congestion, runny nose or sore throat.  SKIN: No rash or itching.  CARDIOVASCULAR: per hpi RESPIRATORY: No shortness of breath, cough or sputum.  GASTROINTESTINAL:  No anorexia, nausea, vomiting or diarrhea. No abdominal pain or blood.  GENITOURINARY: No burning on urination, no polyuria NEUROLOGICAL: No headache, dizziness, syncope, paralysis, ataxia, numbness or tingling in the extremities. No change in bowel or bladder control.  MUSCULOSKELETAL: No muscle, back pain, joint pain or stiffness.  LYMPHATICS: No enlarged nodes. No history of splenectomy.  PSYCHIATRIC: No history of depression or anxiety.  ENDOCRINOLOGIC: No reports of sweating, cold or heat intolerance. No polyuria or polydipsia.  Marland Kitchen   Physical Examination Vitals:   12/08/15 1506  BP: 120/74  Pulse: 66   Vitals:   12/08/15 1506  Weight: 158 lb (71.7 kg)  Height: 5\' 2"  (1.575 m)    Gen: resting comfortably, no acute distress HEENT: no scleral icterus, pupils equal round and reactive, no palptable cervical adenopathy,  CV: RRR, no m/r/g, no jvd Resp: Clear to auscultation bilaterally GI: abdomen is soft, non-tender, non-distended, normal bowel sounds, no hepatosplenomegaly MSK: extremities are warm, trace bilateral edema Skin: warm, no rash Neuro:  no focal  deficits Psych: appropriate affect   Diagnostic Studies 06/05/13 Echo  Procedure narrative: Transthoracic echocardiography. Poor endocardial definition. Intravenous contrast (Definity) was administered. - Left ventricle: The cavity size was normal. Wall thickness was normal. Systolic function was mildly to moderately reduced. The estimated ejection fraction was in the range of 40% to 45%. LV diastolic function cannot be assessed due to the prosthetic mitral valve. - Aortic valve: Poorly visualized. Mildly calcified leaflets. Transvalvular velocity was minimally increased. Mild regurgitation. - Mitral valve: Bioprosthetic mitral valve. Leaflets not well-visualized. Appears to be stable. Peak and mean gradients of 14 and 6 mmHg across the valve. Trivial regurgitation. Valve area by continuity equation (using LVOT flow): 1.33cm^2. - Right ventricle: The cavity size was mildly dilated. Systolic function is mildly reduced. - Right atrium: The atrium was normal in size. - Tricuspid valve: Mild regurgitation. - Pulmonary arteries: PA peak pressure: 81mm Hg (S). - Inferior vena cava: The vessel was dilated; the respirophasic diameter changes were blunted (< 50%); findings are consistent with elevated central venous pressure. - Pericardium, extracardiac: There was no pericardial effusion.  06/06/13 TEE  Study Conclusions  - Left ventricle: The cavity size was normal. Wall thickness was increased in a pattern of mild LVH. The estimated ejection fraction was 45%. Diffuse hypokinesis. - Aortic valve: Bicuspid. There was no stenosis. Mild to moderate regurgitation. - Mitral valve: Bioprosthetic mitral valve with trivial regurgitation and no significant stenosis. Pressure half-time: 71ms. - Left atrium: Ligated at prior surgery but there was still some flow into the appendage. No thrombus noted in appendage. The atrium was mildly dilated. - Right ventricle: The cavity size was normal.  Systolic function was mildly reduced. - Right atrium: No evidence of thrombus in the atrial cavity or appendage. - Tricuspid valve: Moderate regurgitation. Peak RV-RA gradient: 65mm Hg (S). Impressions:  - May proceed to [place].   05/2014 echo Study Conclusions  - Left ventricle: The cavity size was normal. There was mild concentric hypertrophy. Systolic function was normal. The estimated ejection fraction was in the range of 50% to 55%. Images were inadequate for LV wall motion assessment. Diastolic dysfunction, indeterminate grade. - Aortic valve: Mildly calcified annulus. Trileaflet; mildly thickened, mildly calcified leaflets. Mild aortic stenosis. Peak velocity 2.28 m/s. Mean gradient 12 mmHg. There was moderate regurgitation. - Mitral valve: Normally functioning bioprosthetic mitral valve. There was no significant regurgitation. - Left atrium: The atrium was moderately dilated. Volume/bsa, S: 38.2 ml/m^2. - Right ventricle: Systolic function  was mildly reduced. - Right atrium: The atrium was mildly dilated. - Tricuspid valve: There was mild-moderate regurgitation. - Pulmonary arteries: PA peak pressure: 38 mm Hg (S). Mildly elevated pulmonary pressures.     Assessment and Plan   1. Mitral regurigation - s/p MVR with tissue valve. - continue to monitor  2. Chronic systolic/diastolic heart failure - medical therapy limited due to low blood pressures, not on beta blocker. No ACE due to severe CKD - recent weight gain and edema - diuretics recently increased by pcp. Appears volume status has improved. We will recheck her renal function, hold metolazone for now and continue toresmide.   3. Afib - no  symptoms.  - Continue amio and coumadin, CHADS2Vasc score of 4 along with afib in setting of prosthetic MV   4. HTN  at goal,continue current meds  [name], M.D.

## 2015-12-09 ENCOUNTER — Ambulatory Visit (INDEPENDENT_AMBULATORY_CARE_PROVIDER_SITE_OTHER): Payer: Commercial Managed Care - HMO | Admitting: *Deleted

## 2015-12-09 DIAGNOSIS — I5033 Acute on chronic diastolic (congestive) heart failure: Secondary | ICD-10-CM

## 2015-12-09 DIAGNOSIS — Z7901 Long term (current) use of anticoagulants: Secondary | ICD-10-CM

## 2015-12-09 DIAGNOSIS — I4891 Unspecified atrial fibrillation: Secondary | ICD-10-CM

## 2015-12-09 DIAGNOSIS — I10 Essential (primary) hypertension: Secondary | ICD-10-CM | POA: Diagnosis not present

## 2015-12-09 DIAGNOSIS — Z953 Presence of xenogenic heart valve: Secondary | ICD-10-CM | POA: Diagnosis not present

## 2015-12-09 DIAGNOSIS — Z9889 Other specified postprocedural states: Secondary | ICD-10-CM

## 2015-12-09 DIAGNOSIS — I5043 Acute on chronic combined systolic (congestive) and diastolic (congestive) heart failure: Secondary | ICD-10-CM

## 2015-12-09 DIAGNOSIS — Z8679 Personal history of other diseases of the circulatory system: Secondary | ICD-10-CM

## 2015-12-09 LAB — POCT INR: INR: 2.2

## 2015-12-10 ENCOUNTER — Encounter: Payer: Self-pay | Admitting: Cardiology

## 2015-12-10 ENCOUNTER — Encounter: Payer: Self-pay | Admitting: *Deleted

## 2015-12-16 ENCOUNTER — Telehealth: Payer: Self-pay | Admitting: *Deleted

## 2015-12-16 NOTE — Telephone Encounter (Signed)
Pt granddaughter made aware - f/u appt made

## 2015-12-16 NOTE — Telephone Encounter (Signed)
-----   Message from [name], MD sent at 12/15/2015 10:31 AM EDT ----- Labs lok good, kidney function is not normal but stable from last check  Zandra Abts MD

## 2015-12-22 DIAGNOSIS — I509 Heart failure, unspecified: Secondary | ICD-10-CM | POA: Diagnosis not present

## 2015-12-29 DIAGNOSIS — R531 Weakness: Secondary | ICD-10-CM | POA: Diagnosis not present

## 2016-01-06 ENCOUNTER — Ambulatory Visit (INDEPENDENT_AMBULATORY_CARE_PROVIDER_SITE_OTHER): Payer: Commercial Managed Care - HMO | Admitting: *Deleted

## 2016-01-06 DIAGNOSIS — Z953 Presence of xenogenic heart valve: Secondary | ICD-10-CM | POA: Diagnosis not present

## 2016-01-06 DIAGNOSIS — I5043 Acute on chronic combined systolic (congestive) and diastolic (congestive) heart failure: Secondary | ICD-10-CM

## 2016-01-06 DIAGNOSIS — I5033 Acute on chronic diastolic (congestive) heart failure: Secondary | ICD-10-CM

## 2016-01-06 DIAGNOSIS — Z7901 Long term (current) use of anticoagulants: Secondary | ICD-10-CM

## 2016-01-06 DIAGNOSIS — Z9889 Other specified postprocedural states: Secondary | ICD-10-CM | POA: Diagnosis not present

## 2016-01-06 DIAGNOSIS — I4891 Unspecified atrial fibrillation: Secondary | ICD-10-CM

## 2016-01-06 DIAGNOSIS — Z8679 Personal history of other diseases of the circulatory system: Secondary | ICD-10-CM

## 2016-01-06 LAB — POCT INR: INR: 1.8

## 2016-01-07 DIAGNOSIS — E7800 Pure hypercholesterolemia, unspecified: Secondary | ICD-10-CM | POA: Diagnosis not present

## 2016-01-07 DIAGNOSIS — D649 Anemia, unspecified: Secondary | ICD-10-CM | POA: Diagnosis not present

## 2016-01-07 DIAGNOSIS — I10 Essential (primary) hypertension: Secondary | ICD-10-CM | POA: Diagnosis not present

## 2016-01-07 DIAGNOSIS — D519 Vitamin B12 deficiency anemia, unspecified: Secondary | ICD-10-CM | POA: Diagnosis not present

## 2016-01-08 ENCOUNTER — Encounter: Payer: Self-pay | Admitting: Physician Assistant

## 2016-01-08 ENCOUNTER — Ambulatory Visit: Payer: Commercial Managed Care - HMO | Admitting: Physician Assistant

## 2016-01-08 NOTE — Progress Notes (Deleted)
Cardiology Office Note    Date:  01/08/2016   ID:  SHIRONDA CAPONIGRO, DOB [birth year], MRN [id number]  PCP:  [name], MD  Cardiologist: Dr. Harl Bowie   No chief complaint on file.   History of Present Illness:  [name] [name] is a 75 y.o. female with history of severe MR secondary to rheumatic fever status post MVR with the bovine bioprosthetic tissue valve 05/16/13 with combined maze procedure. Last echo 05/2014 LVEF 50-55% with normal functioning MVR, mild AF and moderate MR. She also has combined systolic and diastolic heart failure and is not on an ACE inhibitor due to CKD. History of PAF status post maze procedure and then had an episode of atrial flutter and underwent DC CV 06/06/13 and remains on amiodarone and Coumadin. Has hypertension and CK D stage IV had trouble with nosebleeds requiring cautery.   She last saw Dr. Harl Bowie 12/08/15 complaining of lower extremity edema and 10-12 pound weight gain. No shortness of breath or dyspnea on exertion. Her PCP had increased her diuretics and added metolazone. Her volume status had improved. Dr. branch told her to hold metolazone    Past Medical History:  Diagnosis Date  . Aortic insufficiency due to bicuspid aortic valve    Moderate by TEE  . Arthritis   . Chronic diastolic congestive heart failure ([place])    a) ECHO (09/2013) EF 50-55%  . CKD (chronic kidney disease) stage 4, GFR 15-29 ml/min (HCC)   . COPD (chronic obstructive pulmonary disease) ([place])   . Essential hypertension, benign   . History of stroke    2000 [place]  . History of TIA (transient ischemic attack)    Diagnosed 2003  . Hyperlipidemia   . Juvenile rheumatic fever    Age 3  . 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 NICM (nonischemic cardiomyopathy) ([place])    a) LHC (04/2013) no significant CAD  . Obesity   . Paroxysmal atrial fibrillation (HCC)    s/p MAZE (05/2013)  . S/P Maze operation for atrial fibrillation 05/16/2013   Complete bilateral atrial lesion set using cryothermy and bipolar radiofrequency ablation with oversewing of LA appendage  . S/P minimally invasive mitral valve replacement with bioprosthetic valve and maze procedure 05/16/2013   27 mm Edwards magna mitral bovine bioprosthetic tissue valve placed via right thoracotomy    Past Surgical History:  Procedure Laterality Date  . ABDOMINAL HYSTERECTOMY     Cervical Cancer  . CARDIOVERSION N/A 06/06/2013   Procedure: CARDIOVERSION;  Surgeon: Larey Dresser, MD;  Location: [facility];  Service: Cardiovascular;  Laterality: N/A;  . INTRAOPERATIVE TRANSESOPHAGEAL ECHOCARDIOGRAM N/A 05/16/2013   Procedure: INTRAOPERATIVE TRANSESOPHAGEAL ECHOCARDIOGRAM;  Surgeon: Rexene Alberts, MD;  Location: [facility];  Service: Open Heart Surgery;  Laterality: N/A;  . LEFT AND RIGHT HEART CATHETERIZATION WITH CORONARY ANGIOGRAM N/A 05/04/2013   Procedure: LEFT AND RIGHT HEART CATHETERIZATION WITH CORONARY ANGIOGRAM;  Surgeon: Jettie Booze, MD;  Location: [facility] CATH LAB;  Service: Cardiovascular;  Laterality: N/A;  . MINIMALLY INVASIVE MAZE PROCEDURE N/A 05/16/2013   Procedure: MINIMALLY INVASIVE MAZE PROCEDURE;  Surgeon: Rexene Alberts, MD;  Location: [facility];  Service: Open Heart Surgery;  Laterality: N/A;  . MITRAL VALVE REPLACEMENT Right 05/16/2013   Procedure: MINIMALLY INVASIVE MITRAL VALVE (MV) REPLACEMENT;  Surgeon: Rexene Alberts, MD;  Location: [facility];  Service: Open Heart Surgery;  Laterality: Right;  . Parathyroid/Thyroid surgery     Tumor  . TEE WITHOUT  CARDIOVERSION N/A 04/06/2013   Procedure: TRANSESOPHAGEAL ECHOCARDIOGRAM (TEE);  Surgeon: [name], MD;  [facility];  Service: Cardiology;  Laterality: N/A;  . TEE WITHOUT CARDIOVERSION N/A 06/06/2013   Procedure: TRANSESOPHAGEAL ECHOCARDIOGRAM (TEE);  Surgeon: Larey Dresser, MD;  Location: [facility];  Service: Cardiovascular;  Laterality: N/A;  . TOTAL KNEE ARTHROPLASTY Right      Current Medications: Outpatient Medications Prior to Visit  Medication Sig Dispense Refill  . amiodarone (PACERONE) 200 MG tablet Take 0.5 tablets (100 mg total) by mouth daily. 30 tablet 6  . colchicine 0.6 MG tablet Take 0.5 tablets (0.3 mg total) by mouth daily. 30 tablet 0  . ferrous sulfate 325 (65 FE) MG tablet Take 325 mg by mouth 2 (two) times daily with a meal.    . levothyroxine (SYNTHROID, LEVOTHROID) 50 MCG tablet Take 50 mcg by mouth daily before breakfast.    . metolazone (ZAROXOLYN) 2.5 MG tablet Take 1 tablet (2.5 mg total) by mouth as directed. TAKE 1 TABLET AS NEEDED ONLY IF YOUR WEIGHT INCREASES 2-3 POUNDS IN 24 HOURS. (Patient taking differently: Take 2.5 mg by mouth daily. Marland Kitchen)    . omeprazole (PRILOSEC) 20 MG capsule Take 1 capsule (20 mg total) by mouth daily. Medicine to help protect your stomach lining from ulcers while on coumadin 30 capsule 3  . potassium chloride SA (K-DUR,KLOR-CON) 20 MEQ tablet Take 1 tablet (20 mEq total) by mouth daily. 60 tablet 6  . simvastatin (ZOCOR) 10 MG tablet TAKE ONE TABLET BY MOUTH EVERY EVENING. 30 tablet 3  . tolterodine (DETROL LA) 4 MG 24 hr capsule Take 4 mg by mouth daily.    Marland Kitchen torsemide (DEMADEX) 100 MG tablet TAKE ONE TABLET BY MOUTH EVERY MORNING 90 tablet 3  . warfarin (COUMADIN) 2.5 MG tablet Take 2.5 mg by mouth daily. At 1700     No facility-administered medications prior to visit.      Allergies:   Indomethacin and Norvasc [amlodipine besylate]   Social History   Social History  . Marital status: Widowed    Spouse name: N/A  . Number of children: N/A  . Years of education: N/A   Social History Main Topics  . Smoking status: Never Smoker  . Smokeless tobacco: Never Used  . Alcohol use No  . Drug use: No  . Sexual activity: Not on file   Other Topics Concern  . 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   ROS:   Please see the history of present illness.    Review of Systems  Constitution: Negative.  HENT: Negative.   Eyes: Negative.   Cardiovascular: Negative.   Respiratory: Negative.   Hematologic/Lymphatic: Negative.   Musculoskeletal: Negative.  Negative for joint pain.  Gastrointestinal: Negative.   Genitourinary: Negative.   Neurological: Negative.    All other systems reviewed and are negative.   PHYSICAL EXAM:   VS:  There were no vitals taken for this visit.  Physical Exam  GEN: Well nourished, well developed, in no acute distress  HEENT: normal  Neck: no JVD, carotid bruits, or masses Cardiac:RRR; no murmurs, rubs, or gallops  Respiratory:  clear to auscultation bilaterally, normal work of breathing GI:  soft, nontender, nondistended, + BS Ext: without cyanosis, clubbing, or edema, Good distal pulses bilaterally MS: no deformity or atrophy  Skin: warm and dry, no rash Neuro:  Alert and Oriented x 3, Strength and sensation are intact Psych: euthymic mood, full affect  Wt Readings from Last 3 Encounters:  12/08/15 158 lb (71.7 kg)  09/12/15 158 lb (71.7 kg)  08/29/15 157 lb (71.2 kg)      Studies/Labs Reviewed:   EKG:  EKG is*** ordered today.  The ekg ordered today demonstrates ***  Recent Labs: 06/25/2015: ALT 25 06/26/2015: BUN 36; Creatinine, Ser 1.66; Potassium 3.9; Sodium 137 06/27/2015: Hemoglobin 10.4; Platelets 83   Lipid Panel    Component Value Date/Time   CHOL 89 05/23/2013 0240   TRIG 134 05/23/2013 0240   HDL 27 (L) 05/23/2013 0240   CHOLHDL 3.3 05/23/2013 0240   VLDL 27 05/23/2013 0240   LDLCALC 35 05/23/2013 0240    Additional studies/ records that were reviewed today include:    06/05/13 Echo   Procedure narrative: Transthoracic echocardiography. Poor endocardial definition. Intravenous contrast (Definity) was  administered. - Left ventricle: The cavity size was normal. Wall thickness was normal. Systolic function was mildly to moderately reduced. The estimated ejection fraction was in the range of 40% to 45%. LV diastolic function cannot be assessed due to the prosthetic mitral valve. - Aortic valve: Poorly visualized. Mildly calcified leaflets. Transvalvular velocity was minimally increased. Mild regurgitation. - Mitral valve: Bioprosthetic mitral valve. Leaflets not well-visualized. Appears to be stable. Peak and mean gradients of 14 and 6 mmHg across the valve. Trivial regurgitation. Valve area by continuity equation (using LVOT flow): 1.33cm^2. - Right ventricle: The cavity size was mildly dilated. Systolic function is mildly reduced. - Right atrium: The atrium was normal in size. - Tricuspid valve: Mild regurgitation. - Pulmonary arteries: PA peak pressure: 12mm Hg (S). - Inferior vena cava: The vessel was dilated; the respirophasic diameter changes were blunted (< 50%); findings are consistent with elevated central venous pressure. - Pericardium, extracardiac: There was no pericardial effusion.  06/06/13 TEE   Study Conclusions  - Left ventricle: The cavity size was normal. Wall thickness was increased in a pattern of mild LVH. The estimated ejection fraction was 45%. Diffuse hypokinesis. - Aortic valve: Bicuspid. There was no stenosis. Mild to moderate regurgitation. - Mitral valve: Bioprosthetic mitral valve with trivial regurgitation and no significant stenosis. Pressure half-time: 51ms. - Left atrium: Ligated at prior surgery but there was still some flow into the appendage. No thrombus noted in appendage. The atrium was mildly dilated. - Right ventricle: The cavity size was normal. Systolic function was mildly reduced. - Right atrium: No evidence of thrombus in the atrial cavity or appendage. - Tricuspid valve: Moderate regurgitation. Peak RV-RA gradient: 43mm Hg  (S). Impressions:  - May proceed to [place].     05/2014 echo Study Conclusions  - Left ventricle: The cavity size was normal. There was mild   concentric hypertrophy. Systolic function was normal. The   estimated ejection fraction was in the range of 50% to 55%.   Images were inadequate for LV wall motion assessment. Diastolic   dysfunction, indeterminate grade. - Aortic valve: Mildly calcified annulus. Trileaflet; mildly   thickened, mildly calcified leaflets. Mild aortic stenosis. Peak   velocity 2.28 m/s. Mean gradient 12 mmHg. There was moderate   regurgitation. - Mitral valve: Normally functioning bioprosthetic mitral valve.   There was no significant regurgitation. - Left atrium: The atrium was moderately dilated.  Volume/bsa, S:   38.2 ml/m^2. - Right ventricle: Systolic function was mildly reduced. - Right atrium: The atrium was mildly dilated. - Tricuspid valve: There was mild-moderate regurgitation. - Pulmonary arteries: PA peak pressure: 38 mm Hg (S). Mildly   elevated pulmonary pressures.         ASSESSMENT:    No diagnosis found.   PLAN:  In order of problems listed above:      Medication Adjustments/Labs and Tests Ordered: Current medicines are reviewed at length with the patient today.  Concerns regarding medicines are outlined above.  Medication changes, Labs and Tests ordered today are listed in the Patient Instructions below. There are no Patient Instructions on file for this visit.   [name] Boast, PA-C  01/08/2016 12:31 PM    [facility] [address], [place], [place]  [address] Phone: [phone]; Fax: [phone]

## 2016-01-12 DIAGNOSIS — E876 Hypokalemia: Secondary | ICD-10-CM | POA: Diagnosis not present

## 2016-01-12 DIAGNOSIS — I482 Chronic atrial fibrillation: Secondary | ICD-10-CM | POA: Diagnosis not present

## 2016-01-12 DIAGNOSIS — Z23 Encounter for immunization: Secondary | ICD-10-CM | POA: Diagnosis not present

## 2016-01-12 DIAGNOSIS — N184 Chronic kidney disease, stage 4 (severe): Secondary | ICD-10-CM | POA: Diagnosis not present

## 2016-01-12 DIAGNOSIS — Z6830 Body mass index (BMI) 30.0-30.9, adult: Secondary | ICD-10-CM | POA: Diagnosis not present

## 2016-01-12 DIAGNOSIS — R5383 Other fatigue: Secondary | ICD-10-CM | POA: Diagnosis not present

## 2016-01-12 DIAGNOSIS — I10 Essential (primary) hypertension: Secondary | ICD-10-CM | POA: Diagnosis not present

## 2016-01-12 DIAGNOSIS — I5022 Chronic systolic (congestive) heart failure: Secondary | ICD-10-CM | POA: Diagnosis not present

## 2016-01-22 DIAGNOSIS — I509 Heart failure, unspecified: Secondary | ICD-10-CM | POA: Diagnosis not present

## 2016-01-29 DIAGNOSIS — R531 Weakness: Secondary | ICD-10-CM | POA: Diagnosis not present

## 2016-02-05 ENCOUNTER — Ambulatory Visit (INDEPENDENT_AMBULATORY_CARE_PROVIDER_SITE_OTHER): Payer: Commercial Managed Care - HMO | Admitting: *Deleted

## 2016-02-05 DIAGNOSIS — Z7901 Long term (current) use of anticoagulants: Secondary | ICD-10-CM

## 2016-02-05 DIAGNOSIS — I5033 Acute on chronic diastolic (congestive) heart failure: Secondary | ICD-10-CM

## 2016-02-05 DIAGNOSIS — Z9889 Other specified postprocedural states: Secondary | ICD-10-CM

## 2016-02-05 DIAGNOSIS — I5043 Acute on chronic combined systolic (congestive) and diastolic (congestive) heart failure: Secondary | ICD-10-CM

## 2016-02-05 DIAGNOSIS — I4891 Unspecified atrial fibrillation: Secondary | ICD-10-CM

## 2016-02-05 DIAGNOSIS — Z8679 Personal history of other diseases of the circulatory system: Secondary | ICD-10-CM

## 2016-02-05 DIAGNOSIS — Z953 Presence of xenogenic heart valve: Secondary | ICD-10-CM | POA: Diagnosis not present

## 2016-02-05 LAB — POCT INR: INR: 1.8

## 2016-02-11 ENCOUNTER — Ambulatory Visit: Payer: Commercial Managed Care - HMO | Admitting: Cardiology

## 2016-02-21 DIAGNOSIS — I509 Heart failure, unspecified: Secondary | ICD-10-CM | POA: Diagnosis not present

## 2016-03-05 ENCOUNTER — Other Ambulatory Visit (HOSPITAL_COMMUNITY): Payer: Self-pay | Admitting: Cardiology

## 2016-03-16 ENCOUNTER — Ambulatory Visit (INDEPENDENT_AMBULATORY_CARE_PROVIDER_SITE_OTHER): Payer: Medicare HMO | Admitting: *Deleted

## 2016-03-16 DIAGNOSIS — Z7901 Long term (current) use of anticoagulants: Secondary | ICD-10-CM

## 2016-03-16 DIAGNOSIS — I5033 Acute on chronic diastolic (congestive) heart failure: Secondary | ICD-10-CM

## 2016-03-16 DIAGNOSIS — I5043 Acute on chronic combined systolic (congestive) and diastolic (congestive) heart failure: Secondary | ICD-10-CM | POA: Diagnosis not present

## 2016-03-16 DIAGNOSIS — Z9889 Other specified postprocedural states: Secondary | ICD-10-CM | POA: Diagnosis not present

## 2016-03-16 DIAGNOSIS — Z953 Presence of xenogenic heart valve: Secondary | ICD-10-CM

## 2016-03-16 DIAGNOSIS — I4891 Unspecified atrial fibrillation: Secondary | ICD-10-CM

## 2016-03-16 DIAGNOSIS — Z8679 Personal history of other diseases of the circulatory system: Secondary | ICD-10-CM

## 2016-03-16 LAB — POCT INR: INR: 2.6

## 2016-03-23 DIAGNOSIS — I509 Heart failure, unspecified: Secondary | ICD-10-CM | POA: Diagnosis not present

## 2016-04-09 ENCOUNTER — Inpatient Hospital Stay (HOSPITAL_COMMUNITY)
Admission: EM | Admit: 2016-04-09 | Discharge: 2016-04-10 | DRG: 948 | Disposition: A | Discharge status: Skilled Nursing Facility | Payer: Medicare Other | Attending: Internal Medicine | Admitting: Internal Medicine

## 2016-04-09 ENCOUNTER — Inpatient Hospital Stay (HOSPITAL_COMMUNITY): Payer: Medicare Other

## 2016-04-09 ENCOUNTER — Encounter (HOSPITAL_COMMUNITY): Payer: Self-pay | Admitting: Emergency Medicine

## 2016-04-09 ENCOUNTER — Emergency Department (HOSPITAL_COMMUNITY): Payer: Medicare Other

## 2016-04-09 DIAGNOSIS — I428 Other cardiomyopathies: Secondary | ICD-10-CM | POA: Diagnosis not present

## 2016-04-09 DIAGNOSIS — E669 Obesity, unspecified: Secondary | ICD-10-CM | POA: Diagnosis present

## 2016-04-09 DIAGNOSIS — E785 Hyperlipidemia, unspecified: Secondary | ICD-10-CM | POA: Diagnosis present

## 2016-04-09 DIAGNOSIS — Z8673 Personal history of transient ischemic attack (TIA), and cerebral infarction without residual deficits: Secondary | ICD-10-CM

## 2016-04-09 DIAGNOSIS — M6281 Muscle weakness (generalized): Secondary | ICD-10-CM

## 2016-04-09 DIAGNOSIS — Q231 Congenital insufficiency of aortic valve: Secondary | ICD-10-CM

## 2016-04-09 DIAGNOSIS — I639 Cerebral infarction, unspecified: Secondary | ICD-10-CM | POA: Diagnosis not present

## 2016-04-09 DIAGNOSIS — J449 Chronic obstructive pulmonary disease, unspecified: Secondary | ICD-10-CM | POA: Diagnosis present

## 2016-04-09 DIAGNOSIS — R4182 Altered mental status, unspecified: Secondary | ICD-10-CM | POA: Diagnosis not present

## 2016-04-09 DIAGNOSIS — Z79899 Other long term (current) drug therapy: Secondary | ICD-10-CM

## 2016-04-09 DIAGNOSIS — E039 Hypothyroidism, unspecified: Secondary | ICD-10-CM | POA: Diagnosis not present

## 2016-04-09 DIAGNOSIS — I272 Pulmonary hypertension, unspecified: Secondary | ICD-10-CM | POA: Diagnosis not present

## 2016-04-09 DIAGNOSIS — I5032 Chronic diastolic (congestive) heart failure: Secondary | ICD-10-CM | POA: Diagnosis not present

## 2016-04-09 DIAGNOSIS — G459 Transient cerebral ischemic attack, unspecified: Secondary | ICD-10-CM

## 2016-04-09 DIAGNOSIS — N189 Chronic kidney disease, unspecified: Secondary | ICD-10-CM | POA: Diagnosis present

## 2016-04-09 DIAGNOSIS — N183 Chronic kidney disease, stage 3 (moderate): Secondary | ICD-10-CM | POA: Diagnosis not present

## 2016-04-09 DIAGNOSIS — W19XXXA Unspecified fall, initial encounter: Secondary | ICD-10-CM

## 2016-04-09 DIAGNOSIS — W19XXXS Unspecified fall, sequela: Secondary | ICD-10-CM | POA: Diagnosis not present

## 2016-04-09 DIAGNOSIS — Z7901 Long term (current) use of anticoagulants: Secondary | ICD-10-CM | POA: Diagnosis not present

## 2016-04-09 DIAGNOSIS — Y92099 Unspecified place in other non-institutional residence as the place of occurrence of the external cause: Secondary | ICD-10-CM

## 2016-04-09 DIAGNOSIS — R404 Transient alteration of awareness: Secondary | ICD-10-CM | POA: Diagnosis not present

## 2016-04-09 DIAGNOSIS — Z66 Do not resuscitate: Secondary | ICD-10-CM | POA: Diagnosis present

## 2016-04-09 DIAGNOSIS — S8992XA Unspecified injury of left lower leg, initial encounter: Secondary | ICD-10-CM | POA: Diagnosis not present

## 2016-04-09 DIAGNOSIS — Z952 Presence of prosthetic heart valve: Secondary | ICD-10-CM

## 2016-04-09 DIAGNOSIS — I4821 Permanent atrial fibrillation: Secondary | ICD-10-CM | POA: Diagnosis present

## 2016-04-09 DIAGNOSIS — Z6829 Body mass index (BMI) 29.0-29.9, adult: Secondary | ICD-10-CM | POA: Diagnosis not present

## 2016-04-09 DIAGNOSIS — R531 Weakness: Secondary | ICD-10-CM | POA: Diagnosis not present

## 2016-04-09 DIAGNOSIS — J9611 Chronic respiratory failure with hypoxia: Secondary | ICD-10-CM | POA: Diagnosis present

## 2016-04-09 DIAGNOSIS — Z8541 Personal history of malignant neoplasm of cervix uteri: Secondary | ICD-10-CM

## 2016-04-09 DIAGNOSIS — N184 Chronic kidney disease, stage 4 (severe): Secondary | ICD-10-CM | POA: Diagnosis not present

## 2016-04-09 DIAGNOSIS — Z96651 Presence of right artificial knee joint: Secondary | ICD-10-CM | POA: Diagnosis present

## 2016-04-09 DIAGNOSIS — I6523 Occlusion and stenosis of bilateral carotid arteries: Secondary | ICD-10-CM | POA: Diagnosis not present

## 2016-04-09 DIAGNOSIS — I480 Paroxysmal atrial fibrillation: Secondary | ICD-10-CM | POA: Diagnosis present

## 2016-04-09 DIAGNOSIS — I5189 Other ill-defined heart diseases: Secondary | ICD-10-CM | POA: Diagnosis present

## 2016-04-09 DIAGNOSIS — D696 Thrombocytopenia, unspecified: Secondary | ICD-10-CM | POA: Diagnosis present

## 2016-04-09 DIAGNOSIS — R29898 Other symptoms and signs involving the musculoskeletal system: Secondary | ICD-10-CM

## 2016-04-09 DIAGNOSIS — I351 Nonrheumatic aortic (valve) insufficiency: Secondary | ICD-10-CM | POA: Diagnosis not present

## 2016-04-09 DIAGNOSIS — Z9981 Dependence on supplemental oxygen: Secondary | ICD-10-CM

## 2016-04-09 DIAGNOSIS — N179 Acute kidney failure, unspecified: Secondary | ICD-10-CM | POA: Diagnosis present

## 2016-04-09 DIAGNOSIS — R5381 Other malaise: Principal | ICD-10-CM | POA: Diagnosis present

## 2016-04-09 DIAGNOSIS — Z953 Presence of xenogenic heart valve: Secondary | ICD-10-CM

## 2016-04-09 DIAGNOSIS — I10 Essential (primary) hypertension: Secondary | ICD-10-CM | POA: Diagnosis not present

## 2016-04-09 DIAGNOSIS — D631 Anemia in chronic kidney disease: Secondary | ICD-10-CM | POA: Diagnosis present

## 2016-04-09 DIAGNOSIS — Y92009 Unspecified place in unspecified non-institutional (private) residence as the place of occurrence of the external cause: Secondary | ICD-10-CM

## 2016-04-09 HISTORY — DX: Chronic respiratory failure with hypoxia: J96.11

## 2016-04-09 HISTORY — DX: Other ill-defined heart diseases: I51.89

## 2016-04-09 HISTORY — DX: Pulmonary hypertension, unspecified: I27.20

## 2016-04-09 LAB — COMPREHENSIVE METABOLIC PANEL
ALT: 10 U/L — AB (ref 14–54)
ANION GAP: 10 (ref 5–15)
AST: 27 U/L (ref 15–41)
Albumin: 3.3 g/dL — ABNORMAL LOW (ref 3.5–5.0)
Alkaline Phosphatase: 127 U/L — ABNORMAL HIGH (ref 38–126)
BUN: 33 mg/dL — ABNORMAL HIGH (ref 6–20)
CHLORIDE: 98 mmol/L — AB (ref 101–111)
CO2: 32 mmol/L (ref 22–32)
Calcium: 9.2 mg/dL (ref 8.9–10.3)
Creatinine, Ser: 1.92 mg/dL — ABNORMAL HIGH (ref 0.44–1.00)
GFR calc Af Amer: 28 mL/min — ABNORMAL LOW (ref >60)
GFR, EST NON AFRICAN AMERICAN: 24 mL/min — AB (ref >60)
GLUCOSE: 113 mg/dL — AB (ref 65–99)
POTASSIUM: 3.9 mmol/L (ref 3.5–5.1)
Sodium: 140 mmol/L (ref 135–145)
Total Bilirubin: 1.2 mg/dL (ref 0.3–1.2)
Total Protein: 6.9 g/dL (ref 6.5–8.1)

## 2016-04-09 LAB — ECHOCARDIOGRAM COMPLETE
AV Area VTI index: 1.07 cm2/m2
AV Area VTI: 2.26 cm2
AV Area mean vel: 1.68 cm2
AV Mean grad: 11 mmHg
AV pk vel: 216 cm/s
AVAREAMEANVIN: 0.96 cm2/m2
AVCELMEANRAT: 0.59
AVPG: 19 mmHg
Ao pk vel: 0.8 m/s
CHL CUP AV PEAK INDEX: 1.29
CHL CUP AV VEL: 1.88
CHL CUP LVOT MV VTI: 2.19
CHL CUP MV DEC (S): 299
CHL CUP MV M VEL: 70
CHL CUP RV SYS PRESS: 66 mmHg
CHL CUP TV REG PEAK VELOCITY: 357 cm/s
DOP CAL AO MEAN VELOCITY: 163 cm/s
EERAT: 37.56
EWDT: 299 ms
FS: 18 % — AB (ref 28–44)
Height: 62 in
IV/PV OW: 1
LA diam end sys: 32 mm
LA diam index: 1.83 cm/m2
LA vol index: 37.1 mL/m2
LASIZE: 32 mm
LAVOL: 64.9 mL
LAVOLA4C: 56.1 mL
LV e' LATERAL: 3.94 cm/s
LVEEAVG: 37.56
LVEEMED: 37.56
LVOT MV VTI INDEX: 1.25 cm2/m2
LVOT SV: 83 mL
LVOT VTI: 29.1 cm
LVOT area: 2.84 cm2
LVOT peak grad rest: 12 mmHg
LVOT peak vel: 172 cm/s
LVOTD: 19 mm
LVOTVTI: 0.66 cm
Lateral S' vel: 7.71 cm/s
MV pk A vel: 70.4 m/s
MV pk E vel: 148 m/s
MVANNULUSVTI: 37.7 cm
MVAP: 1.96 cm2
MVG: 3 mmHg
MVPG: 9 mmHg
MVSPHT: 110 ms
P 1/2 time: 456 ms
PW: 12.9 mm — AB (ref 0.6–1.1)
TDI e' lateral: 3.94
TDI e' medial: 4.33
TR max vel: 357 cm/s
VTI: 44 cm
Valve area index: 1.07
Valve area: 1.88 cm2
Weight: 2592 oz

## 2016-04-09 LAB — APTT: aPTT: 39 seconds — ABNORMAL HIGH (ref 24–36)

## 2016-04-09 LAB — CBC
HCT: 35.1 % — ABNORMAL LOW (ref 36.0–46.0)
HEMOGLOBIN: 11.4 g/dL — AB (ref 12.0–15.0)
MCH: 31.8 pg (ref 26.0–34.0)
MCHC: 32.5 g/dL (ref 30.0–36.0)
MCV: 98 fL (ref 78.0–100.0)
Platelets: 112 10*3/uL — ABNORMAL LOW (ref 150–400)
RBC: 3.58 MIL/uL — ABNORMAL LOW (ref 3.87–5.11)
RDW: 15.5 % (ref 11.5–15.5)
WBC: 4.7 10*3/uL (ref 4.0–10.5)

## 2016-04-09 LAB — TSH: TSH: 4.713 u[IU]/mL — AB (ref 0.350–4.500)

## 2016-04-09 LAB — DIFFERENTIAL
BASOS PCT: 0 %
Basophils Absolute: 0 10*3/uL (ref 0.0–0.1)
Eosinophils Absolute: 0.1 10*3/uL (ref 0.0–0.7)
Eosinophils Relative: 3 %
LYMPHS ABS: 0.4 10*3/uL — AB (ref 0.7–4.0)
Lymphocytes Relative: 8 %
MONO ABS: 0.4 10*3/uL (ref 0.1–1.0)
MONOS PCT: 9 %
NEUTROS ABS: 3.8 10*3/uL (ref 1.7–7.7)
NEUTROS PCT: 80 %
Smear Review: ADEQUATE

## 2016-04-09 LAB — LIPID PANEL
CHOL/HDL RATIO: 1.9 ratio
Cholesterol: 95 mg/dL (ref 0–200)
HDL: 50 mg/dL (ref >40)
LDL CALC: 28 mg/dL (ref 0–99)
Triglycerides: 83 mg/dL (ref <150)
VLDL: 17 mg/dL (ref 0–40)

## 2016-04-09 LAB — I-STAT TROPONIN, ED: Troponin i, poc: 0.02 ng/mL (ref 0.00–0.08)

## 2016-04-09 LAB — PROTIME-INR
INR: 2.77
PROTHROMBIN TIME: 29.8 s — AB (ref 11.4–15.2)

## 2016-04-09 MED ORDER — SIMVASTATIN 10 MG PO TABS
10.0000 mg | ORAL_TABLET | Freq: Every evening | ORAL | Status: DC
  Administered 2016-04-09: 10 mg via ORAL
  Filled 2016-04-09: qty 1

## 2016-04-09 MED ORDER — ACETAMINOPHEN 325 MG PO TABS
650.0000 mg | ORAL_TABLET | ORAL | Status: DC | PRN
  Administered 2016-04-09 – 2016-04-10 (×4): 650 mg via ORAL
  Filled 2016-04-09 (×5): qty 2

## 2016-04-09 MED ORDER — SENNOSIDES-DOCUSATE SODIUM 8.6-50 MG PO TABS: 1.0000 | ORAL_TABLET | Freq: Every evening | ORAL | Status: DC | PRN

## 2016-04-09 MED ORDER — AMIODARONE HCL 200 MG PO TABS
100.0000 mg | ORAL_TABLET | Freq: Every day | ORAL | Status: DC
  Administered 2016-04-09 – 2016-04-10 (×2): 100 mg via ORAL
  Filled 2016-04-09 (×2): qty 1

## 2016-04-09 MED ORDER — STROKE: EARLY STAGES OF RECOVERY BOOK
Freq: Once | Status: AC
  Administered 2016-04-09: 10:00:00
  Filled 2016-04-09 (×2): qty 1

## 2016-04-09 MED ORDER — WARFARIN - PHARMACIST DOSING INPATIENT
Freq: Every day | Status: DC
  Administered 2016-04-09: 18:00:00

## 2016-04-09 MED ORDER — WARFARIN SODIUM 2 MG PO TABS
2.0000 mg | ORAL_TABLET | Freq: Once | ORAL | Status: AC
  Administered 2016-04-09: 2 mg via ORAL
  Filled 2016-04-09: qty 1

## 2016-04-09 MED ORDER — ACETAMINOPHEN 160 MG/5ML PO SOLN: 650.0000 mg | ORAL | Status: DC | PRN

## 2016-04-09 MED ORDER — FERROUS SULFATE 325 (65 FE) MG PO TABS
325.0000 mg | ORAL_TABLET | Freq: Two times a day (BID) | ORAL | Status: DC
  Administered 2016-04-09 – 2016-04-10 (×3): 325 mg via ORAL
  Filled 2016-04-09 (×3): qty 1

## 2016-04-09 MED ORDER — ACETAMINOPHEN 650 MG RE SUPP: 650.0000 mg | RECTAL | Status: DC | PRN

## 2016-04-09 MED ORDER — ORAL CARE MOUTH RINSE
15.0000 mL | Freq: Two times a day (BID) | OROMUCOSAL | Status: DC
  Administered 2016-04-09 – 2016-04-10 (×3): 15 mL via OROMUCOSAL

## 2016-04-09 MED ORDER — PANTOPRAZOLE SODIUM 40 MG PO TBEC
40.0000 mg | DELAYED_RELEASE_TABLET | Freq: Every day | ORAL | Status: DC
  Administered 2016-04-09 – 2016-04-10 (×2): 40 mg via ORAL
  Filled 2016-04-09 (×2): qty 1

## 2016-04-09 MED ORDER — LEVOTHYROXINE SODIUM 50 MCG PO TABS
50.0000 ug | ORAL_TABLET | Freq: Every day | ORAL | Status: DC
  Administered 2016-04-09 – 2016-04-10 (×2): 50 ug via ORAL
  Filled 2016-04-09 (×2): qty 1

## 2016-04-09 NOTE — Care Management Important Message (Signed)
Important Message  Patient Details  Name: [name] [name] MRN: [id number] Date of Birth: Aug 31, 1940   Medicare Important Message Given:  Yes    Sherald Barge, RN 04/09/2016, 12:19 PM

## 2016-04-09 NOTE — Progress Notes (Signed)
SLP Cancellation Note  Patient Details Name: SHERILYN HAUSWIRTH MRN: [id number] DOB: 09/24/1940   Cancelled treatment:       Reason Eval/Treat Not Completed: SLP screened, no needs identified, will sign off  Thank you,  Genene Churn, [place]  Markise Haymer 04/09/2016, 11:26 AM

## 2016-04-09 NOTE — H&P (Signed)
History and Physical    [name] [name] [id number] DOB: [birth year] DOA: 04/09/2016  PCP: [name] Hilding, MD  Patient coming from: Home.   Chief Complaint: Left leg weakness.   HPI: [name] [name] is an 76 y.o. female with hx of PAF on coumadin, s/p MAZE, hx of prior TIA, s/p bioprosthetic MR, hx of COPD, CKD, chronic diastolic CHF, lives alone and ambulates with a rolling walker, presented to the ER as she was having acute left lower extremity weakness.  By the time she gotten into the ER, it resolved. She had no HA, nausea, vomtiing, upper extremity weakness, or facial droop.  No slurred speech.  Work up in the ER included a Cr of 1.9,  EKG showed NSR, no leukocytosis, and INR of 2.7.  Her UA was negative.  Hospitalist was asked to admit her for TIA/CVA work up.    ED Course:  See above.  Rewiew of Systems:  Constitutional: Negative for malaise, fever and chills. No significant weight loss or weight gain Eyes: Negative for eye pain, redness and discharge, diplopia, visual changes, or flashes of light. ENMT: Negative for ear pain, hoarseness, nasal congestion, sinus pressure and sore throat. No headaches; tinnitus, drooling, or problem swallowing. Cardiovascular: Negative for chest pain, palpitations, diaphoresis, dyspnea and peripheral edema. ; No orthopnea, PND Respiratory: Negative for cough, hemoptysis, wheezing and stridor. No pleuritic chestpain. Gastrointestinal: Negative for diarrhea, constipation,  melena, blood in stool, hematemesis, jaundice and rectal bleeding.    Genitourinary: Negative for frequency, dysuria, incontinence,flank pain and hematuria; Musculoskeletal: Negative for back pain and neck pain. Negative for swelling and trauma.;  Skin: . Negative for pruritus, rash, abrasions, bruising and skin lesion.; ulcerations Neuro: Negative for headache, lightheadedness and neck stiffness. Negative for weakness, altered level of consciousness , altered mental status,  burning feet, involuntary movement, seizure and syncope.  Psych: negative for anxiety, depression, insomnia, tearfulness, panic attacks, hallucinations, paranoia, suicidal or homicidal ideation    Past Medical History:  Diagnosis Date  . Aortic insufficiency due to bicuspid aortic valve    Moderate by TEE  . Arthritis   . Chronic diastolic congestive heart failure ([place])    a) ECHO (09/2013) EF 50-55%  . CKD (chronic kidney disease) stage 4, GFR 15-29 ml/min (HCC)   . COPD (chronic obstructive pulmonary disease) ([place])   . Essential hypertension, benign   . History of stroke    2000 [place]  . History of TIA (transient ischemic attack)    Diagnosed 2003  . Hyperlipidemia   . Juvenile rheumatic fever    Age 53  . 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 NICM (nonischemic cardiomyopathy) ([place])    a) LHC (04/2013) no significant CAD  . Obesity   . Paroxysmal atrial fibrillation (HCC)    s/p MAZE (05/2013)  . S/P Maze operation for atrial fibrillation 05/16/2013   Complete bilateral atrial lesion set using cryothermy and bipolar radiofrequency ablation with oversewing of LA appendage  . S/P minimally invasive mitral valve replacement with bioprosthetic valve and maze procedure 05/16/2013   27 mm Edwards magna mitral bovine bioprosthetic tissue valve placed via right thoracotomy    Past Surgical History:  Procedure Laterality Date  . ABDOMINAL HYSTERECTOMY     Cervical Cancer  . CARDIOVERSION N/A 06/06/2013   Procedure: CARDIOVERSION;  Surgeon: Larey Dresser, MD;  Location: [facility] ENDOSCOPY;  Service: Cardiovascular;  Laterality: N/A;  . INTRAOPERATIVE TRANSESOPHAGEAL ECHOCARDIOGRAM N/A 05/16/2013   Procedure: INTRAOPERATIVE TRANSESOPHAGEAL ECHOCARDIOGRAM;  Surgeon: Rexene Alberts, MD;  Location: [place];  Service: Open Heart Surgery;  Laterality: N/A;  . LEFT AND RIGHT HEART CATHETERIZATION WITH CORONARY ANGIOGRAM N/A 05/04/2013   Procedure: LEFT AND RIGHT HEART  CATHETERIZATION WITH CORONARY ANGIOGRAM;  Surgeon: Jettie Booze, MD;  [facility] CATH LAB;  Service: Cardiovascular;  Laterality: N/A;  . MINIMALLY INVASIVE MAZE PROCEDURE N/A 05/16/2013   Procedure: MINIMALLY INVASIVE MAZE PROCEDURE;  Surgeon: Rexene Alberts, MD;  Location: [employer];  Service: Open Heart Surgery;  Laterality: N/A;  . MITRAL VALVE REPLACEMENT Right 05/16/2013   Procedure: MINIMALLY INVASIVE MITRAL VALVE (MV) REPLACEMENT;  Surgeon: Rexene Alberts, MD;  Location: [place];  Service: Open Heart Surgery;  Laterality: Right;  . Parathyroid/Thyroid surgery     Tumor  . TEE WITHOUT CARDIOVERSION N/A 04/06/2013   Procedure: TRANSESOPHAGEAL ECHOCARDIOGRAM (TEE);  Surgeon: [name], MD;  [facility];  Service: Cardiology;  Laterality: N/A;  . TEE WITHOUT CARDIOVERSION N/A 06/06/2013   Procedure: TRANSESOPHAGEAL ECHOCARDIOGRAM (TEE);  Surgeon: Larey Dresser, MD;  Location: [facility];  Service: Cardiovascular;  Laterality: N/A;  . TOTAL KNEE ARTHROPLASTY Right      reports that she has never smoked. She has never used smokeless tobacco. She reports that she does not drink alcohol or use drugs.  Allergies  Allergen Reactions  . Indomethacin Other (See Comments)    dizziness  . Norvasc [Amlodipine Besylate] Cough    Family History  Problem Relation Age of Onset  . Heart failure Father   . Heart attack Brother      Prior to Admission medications   Medication Sig Start Date End Date Taking? Authorizing Provider  amiodarone (PACERONE) 200 MG tablet Take 0.5 tablets (100 mg total) by mouth daily. 10/10/14   [name], MD  colchicine 0.6 MG tablet Take 0.5 tablets (0.3 mg total) by mouth daily. 10/11/13   Rande Brunt, NP  ferrous sulfate 325 (65 FE) MG tablet Take 325 mg by mouth 2 (two) times daily with a meal.    Historical Provider, MD  levothyroxine (SYNTHROID, LEVOTHROID) 50 MCG tablet Take 50 mcg by mouth daily before breakfast.    Historical  Provider, MD  metolazone (ZAROXOLYN) 2.5 MG tablet Take 1 tablet (2.5 mg total) by mouth as directed. TAKE 1 TABLET AS NEEDED ONLY IF YOUR WEIGHT INCREASES 2-3 POUNDS IN 24 HOURS. Patient taking differently: Take 2.5 mg by mouth daily. . 05/13/14   Rexene Alberts, MD  omeprazole (PRILOSEC) 20 MG capsule Take 1 capsule (20 mg total) by mouth daily. Medicine to help protect your stomach lining from ulcers while on coumadin 05/13/14   Rexene Alberts, MD  potassium chloride SA (K-DUR,KLOR-CON) 20 MEQ tablet Take 1 tablet (20 mEq total) by mouth daily. 05/30/13   Donielle Liston Alba, PA-C  simvastatin (ZOCOR) 10 MG tablet TAKE ONE TABLET BY MOUTH EVERY EVENING. 12/10/13   Tiffany L Reed, DO  tolterodine (DETROL LA) 4 MG 24 hr capsule Take 4 mg by mouth daily.    Historical Provider, MD  torsemide (DEMADEX) 100 MG tablet TAKE ONE TABLET BY MOUTH EVERY MORNING 03/05/16   [name], MD  warfarin (COUMADIN) 2.5 MG tablet Take 2.5 mg by mouth daily. At 1700    Historical Provider, MD    Physical Exam: Vitals:   04/09/16 0400 04/09/16 0415 04/09/16 0431 04/09/16 0445  BP: (!) 124/51 116/65 (!) 109/50 109/69  Pulse: 64 63 63 62  Resp: 18 18  21 16  Temp:      TempSrc:      SpO2: 96% 99% 95% 97%  Weight:      Height:          Constitutional: NAD, calm, comfortable Vitals:   04/09/16 0400 04/09/16 0415 04/09/16 0431 04/09/16 0445  BP: (!) 124/51 116/65 (!) 109/50 109/69  Pulse: 64 63 63 62  Resp: 18 18 21 16   Temp:      TempSrc:      SpO2: 96% 99% 95% 97%  Weight:      Height:       Eyes: PERRL, lids and conjunctivae normal ENMT: Mucous membranes are moist. Posterior pharynx clear of any exudate or lesions.Normal dentition.  Neck: normal, supple, no masses, no thyromegaly Respiratory: clear to auscultation bilaterally, no wheezing, no crackles. Normal respiratory effort. No accessory muscle use.  Cardiovascular: Regular rate and rhythm, no murmurs / rubs / gallops. No extremity edema. 2+  pedal pulses. No carotid bruits.  Abdomen: no tenderness, no masses palpated. No hepatosplenomegaly. Bowel sounds positive.  Musculoskeletal: no clubbing / cyanosis. No joint deformity upper and lower extremities. Good ROM, no contractures. Normal muscle tone.  Skin: no rashes, lesions, ulcers. No induration Neurologic: CN 2-12 grossly intact. Sensation intact, DTR normal. Strength 5/5 in all 4.  Psychiatric: Normal judgment and insight. Alert and oriented x 3. Normal mood.   Labs on Admission: I have personally reviewed following labs and imaging studies  CBC:  Recent Labs Lab 04/09/16 0229  WBC 4.7  NEUTROABS 3.8  HGB 11.4*  HCT 35.1*  MCV 98.0  PLT [id number]*   Basic Metabolic Panel:  Recent Labs Lab 04/09/16 0229  NA 140  K 3.9  CL 98*  CO2 32  GLUCOSE 113*  BUN 33*  CREATININE 1.92*  CALCIUM 9.2   GFR: Estimated Creatinine Clearance: 23.8 mL/min (by C-G formula based on SCr of 1.92 mg/dL (H)). Liver Function Tests:  Recent Labs Lab 04/09/16 0229  AST 27  ALT 10*  ALKPHOS 127*  BILITOT 1.2  PROT 6.9  ALBUMIN 3.3*   Coagulation Profile:  Recent Labs Lab 04/09/16 0229  INR 2.77   Cardiac Enzymes: Urine analysis:    Component Value Date/Time   COLORURINE YELLOW 06/24/2015 1828   APPEARANCEUR CLEAR 06/24/2015 1828   LABSPEC <1.005 (L) 06/24/2015 1828   PHURINE 5.5 06/24/2015 1828   GLUCOSEU NEGATIVE 06/24/2015 1828   HGBUR TRACE (A) 06/24/2015 1828   BILIRUBINUR NEGATIVE 06/24/2015 1828   KETONESUR NEGATIVE 06/24/2015 1828   PROTEINUR NEGATIVE 06/24/2015 1828   UROBILINOGEN 0.2 05/09/2014 1455   NITRITE NEGATIVE 06/24/2015 1828   LEUKOCYTESUR NEGATIVE 06/24/2015 1828   Radiological Exams on Admission: Ct Head Wo Contrast  Result Date: 04/09/2016 CLINICAL DATA:  Weakness and left lower extremity.  Now resolved. EXAM: CT HEAD WITHOUT CONTRAST TECHNIQUE: Contiguous axial images were obtained from the base of the skull through the vertex without  intravenous contrast. COMPARISON:  Head CT 11/15/2013 FINDINGS: Brain: No evidence of acute infarction, hemorrhage, hydrocephalus, extra-axial collection or mass lesion/mass effect. Moderate cerebral atrophy and chronic small vessel ischemia. Remote lacunar infarcts in the basal ganglia bilaterally. Vascular: Atherosclerosis of skullbase vasculature without hyperdense vessel or abnormal calcification. Skull: Normal. Negative for fracture or focal lesion. Sinuses/Orbits: Paranasal sinuses and mastoid air cells are clear. The visualized orbits are unremarkable. Bilateral cataract resection. Other: None. IMPRESSION: No acute intracranial abnormality. Atrophy, remote and chronic small vessel ischemic change. Electronically Signed   By: Jeb Levering  M.D.   On: 04/09/2016 03:07    EKG: Independently reviewed.  Assessment/Plan Active Problems:   HTN (hypertension)   Chronic kidney disease   Paroxysmal atrial fibrillation (HCC)   Aortic insufficiency due to bicuspid aortic valve   S/P MVR (mitral valve replacement)   Long term current use of anticoagulant therapy   Acute on chronic diastolic heart failure (HCC)   Anemia in chronic kidney disease   DNR (do not resuscitate)   Fall at home   Hypothyroidism   TIA (transient ischemic attack)    PLAN:   TIA/CVA:  I suspect she may have had a internal capsule TIA affecting only her lower extremity.  Given her ABCD2 score of 3, will admit her for stroke work up, though I suspect no new intervention will result.  Will obtain MRI/MRA, doppler carotid and ECHO.  Will continue with her coumadin, as I think her CVA, if she has one, would be quite small.  Will order PT to be sure she is safe for discharge.  CKD:  Stable, avoid nephrotoxic drug.   Hypothyrodism:  Will continue with supplement, check TSH.  PAF:  Will continue with Coumadin per [facility] dosing.   Hx of CHF, mostly diastolic.  Well compensated.  Hold Diuretics for now.  WIll need to resume them  upon discharge.    DVT prophylaxis: Coumadin.  Code Status: DNR  Confirmed with her and her sister.  Family Communication: Sister at bedside.  Disposition Plan: To home, likely.  Consults called: None.  Admission status:OBS>    Kendrea Cerritos MD FACP. [facility]  If 7PM-7AM, please contact night-coverage [email or web address] Password [email or web address]  04/09/2016, 5:02 AM

## 2016-04-09 NOTE — Clinical Social Work Note (Signed)
Clinical Social Work Assessment  Patient Details  Name: [name] [name] MRN: [id number] Date of Birth: 1940-12-27  Date of referral:  04/09/16               Reason for consult:  Discharge Planning                Permission sought to share information with:    Permission granted to share information::     Name::        Agency::     Relationship::     Contact Information:     Housing/Transportation Living arrangements for the past 2 months:  Single Family Home Source of Information:  Patient Patient Interpreter Needed:  None Criminal Activity/Legal Involvement Pertinent to Current Situation/Hospitalization:  No - Comment as needed Significant Relationships:  Adult Children, Other Family Members Lives with:  Self Do you feel safe going back to the place where you live?   (needs rehab first) Need for family participation in patient care:  Yes (Comment)  Care giving concerns:  Pt lives alone and is not at baseline.    Social Worker assessment / plan:  CSW met with pt at bedside. Pt alert and oriented and reports she lives alone. Pt is independent at baseline and ambulates with walker. Discussed PT recommendation for SNF. Pt has been to SNF before. She requests [facility], [facility], or [place]. Five day letter of guarantee approved by supervisor. Discussed this with pt who reports understanding. CSW awaiting call from daughter to discuss d/c planning as well.   Employment status:  Retired Nurse, adult PT Recommendations:  [place] / Referral to community resources:  [place]  Patient/Family's Response to care:  Pt requests short term rehab prior to return home.    Patient/Family's Understanding of and Emotional Response to Diagnosis, Current Treatment, and Prognosis:  Pt appears to be aware of admission diagnosis and treatment plan.   Emotional Assessment Appearance:  Appears stated  age Attitude/Demeanor/Rapport:  Other (Pleasant) Affect (typically observed):  Accepting Orientation:  Oriented to Self, Oriented to Place, Oriented to  Time, Oriented to Situation Alcohol / Substance use:  Not Applicable Psych involvement (Current and /or in the community):  No (Comment)  Discharge Needs  Concerns to be addressed:  Discharge Planning Concerns Readmission within the last 30 days:  No Current discharge risk:  Lives alone, Physical Impairment Barriers to Discharge:  Continued Medical Work up   [employer], [place] 04/09/2016, 12:33 PM [phone]

## 2016-04-09 NOTE — NC FL2 (Signed)
[place] LEVEL OF CARE SCREENING TOOL     IDENTIFICATION  Patient Name: [name] [name] Birthdate: 09-Oct-1940 Sex: female Admission Date (Current Location): 04/09/2016  [facility] and [place] Number:  [employer] and Address:  [place] [address] [place]      Provider Number: [id number]  Attending Physician Name and Address:  Rexene Alberts, MD  Relative Name and Phone Number:       Current Level of Care: Hospital Recommended Level of Care: [place] Prior Approval Number:    Date Approved/Denied:   PASRR Number: [id number]  Discharge Plan: SNF    Current Diagnoses: Patient Active Problem List   Diagnosis Date Noted  . TIA (transient ischemic attack) 04/09/2016  . CKD (chronic kidney disease) stage 3, GFR 30-59 ml/min 06/25/2015  . AKI (acute kidney injury) ([place]) 06/25/2015  . Hypoxia 11/02/2014  . Diastolic CHF, acute on chronic (HCC) 11/02/2014  . CAP (community acquired pneumonia) 11/02/2014  . Anemia, iron deficiency 05/09/2014  . UTI (urinary tract infection) 05/09/2014  . Obesity 05/08/2014  . Elevated troponin 05/08/2014  . Hypothyroidism 05/08/2014  . Acute respiratory failure with hypoxia ([place]) 11/14/2013  . Fx lumbar vertebra-closed ([place]) 11/12/2013  . Lumbar vertebral fracture ([place]) 11/11/2013  . Fall at home 11/11/2013  . DNR (do not resuscitate) 10/23/2013  . Anemia in chronic kidney disease 10/20/2013  . Acute on chronic diastolic heart failure ([place]) 10/14/2013  . Streptococcal bacteremia 10/08/2013  . Congenital bicuspid aortic valve 10/08/2013  . Acute on chronic systolic heart failure ([place]) 10/03/2013  . Acute on chronic renal failure ([place]) 10/03/2013  . Long term current use of anticoagulant therapy 06/22/2013  . Chronic systolic heart failure ([place]) 06/18/2013  . S/P MVR (mitral valve replacement) 06/04/2013  . CVA (cerebral infarction) 05/21/2013  . Acute pulmonary  edema ([place]) 05/18/2013  . 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 S/P Maze operation for atrial fibrillation 05/16/2013  . Aortic insufficiency due to bicuspid aortic valve   . Chronic kidney disease   . Paroxysmal atrial fibrillation (HCC)   . Chronic diastolic congestive heart failure ([place])   . Arthritis   . Acute respiratory failure ([place]) 05/02/2013  . Atrial fibrillation with RVR ([place]) 04/29/2013  . Shortness of breath   . Juvenile rheumatic fever   . Mitral regurgitation 02/16/2013  . HTN (hypertension) 02/15/2011    Orientation RESPIRATION BLADDER Height & Weight     Self, Time, Situation, Place  Normal Incontinent Weight: 162 lb (73.5 kg) Height:  5\' 2"  (157.5 cm)  BEHAVIORAL SYMPTOMS/MOOD NEUROLOGICAL BOWEL NUTRITION STATUS  Other (Comment) (none)  (n/a) Incontinent Diet (Heart healthy)  AMBULATORY STATUS COMMUNICATION OF NEEDS Skin   Total Care Verbally Skin abrasions, Bruising                       Personal Care Assistance Level of Assistance  Bathing, Feeding, Dressing Bathing Assistance: Maximum assistance Feeding assistance: Limited assistance Dressing Assistance: Maximum assistance     Functional Limitations Info  Sight, Hearing, Speech Sight Info: Impaired Hearing Info: Impaired Speech Info: Adequate    SPECIAL CARE FACTORS FREQUENCY  PT (By licensed PT), OT (By licensed OT)     PT Frequency: 5 OT Frequency: 5            Contractures      Additional Factors Info  Code Status, Allergies Code Status Info:  DNR Allergies Info: Indomethacin, Norvasc (Amlodipine Besylate)           Current Medications (04/09/2016):  This is the current hospital active medication list Current Facility-Administered Medications  Medication Dose Route Frequency Provider Last Rate Last Dose  .  stroke: mapping our early stages of recovery book   Does not apply Once Orvan Falconer, MD      .  acetaminophen (TYLENOL) tablet 650 mg  650 mg Oral Q4H PRN Orvan Falconer, MD   650 mg at 04/09/16 [id number]   Or  . acetaminophen (TYLENOL) solution 650 mg  650 mg Per Tube Q4H PRN Orvan Falconer, MD       Or  . acetaminophen (TYLENOL) suppository 650 mg  650 mg Rectal Q4H PRN Orvan Falconer, MD      . amiodarone (PACERONE) tablet 100 mg  100 mg Oral Daily Orvan Falconer, MD   100 mg at 04/09/16 0908  . ferrous sulfate tablet 325 mg  325 mg Oral BID WC Orvan Falconer, MD   325 mg at 04/09/16 0907  . levothyroxine (SYNTHROID, LEVOTHROID) tablet 50 mcg  50 mcg Oral QAC breakfast Orvan Falconer, MD   50 mcg at 04/09/16 0908  . MEDLINE mouth rinse  15 mL Mouth Rinse BID Nita Sells, MD      . pantoprazole (PROTONIX) EC tablet 40 mg  40 mg Oral Daily Orvan Falconer, MD   40 mg at 04/09/16 0908  . senna-docusate (Senokot-S) tablet 1 tablet  1 tablet Oral QHS PRN Orvan Falconer, MD      . simvastatin (ZOCOR) tablet 10 mg  10 mg Oral QPM Orvan Falconer, MD      . warfarin (COUMADIN) tablet 2 mg  2 mg Oral Once Rexene Alberts, MD      . Warfarin - Pharmacist Dosing Inpatient   Does not apply [id number] Rexene Alberts, MD         Discharge Medications: Please see discharge summary for a list of discharge medications.  Relevant Imaging Results:  Relevant Lab Results:   Additional Information SSN: [id number]. 5 day LOG.   Benay Pike [place], [place]

## 2016-04-09 NOTE — Progress Notes (Signed)
Patient is a 76 year old woman with a history of PAF on Coumadin, status post replacement of mitral valve with bioprosthetic valve and Maze procedure, chronic thrombocytopenia, chronic diastolic congestive heart failure, hypertension, and CKD. She was admitted this morning by Dr. Marin Comment for left leg weakness. In the ED, she was afebrile and hemodynamically stable. CT scan of the head revealed no acute intracranial abnormality. Her creatinine was 1.92. Her INR was therapeutic at 2.7.  -Patient was seen and briefly examined. Her chart, vital signs, laboratory studies were reviewed. -MRI of the brain revealed no acute intracranial abnormality, but with chronic small vessel disease. MRA of her brain was essentially negative. Her carotid ultrasound revealed some artherosclerotic vascular disease bilaterally, but no significant ICA stenosis. 2-D echo is pending.  -PT evaluated the patient and recommended SNF. -Patient appears to be extremely deconditioned which is likely the etiology of her weakness. She also has underlying DJD which could be contributing. Radiographically, she did not have a stroke and less likely TIA. -We'll order vitamin B12 level to assess her weakness and to assess chronic thrombocytopenia. -Her TSH was slightly elevated at 4.7. Will check a free T4 and continue Synthroid as ordered.

## 2016-04-09 NOTE — Progress Notes (Signed)
  Echocardiogram 2D Echocardiogram has been performed.  [name] [name] 04/09/2016, 3:28 PM

## 2016-04-09 NOTE — Clinical Social Work Placement (Addendum)
Pt's daughter to pay bill at [facility] this afternoon. Anticipate d/c tomorrow. PNC to start authorization.  CLINICAL SOCIAL WORK PLACEMENT  NOTE  Date:  04/09/2016  Patient Details  Name: [name] [name] MRN: [id number] Date of Birth: [birth year]  Clinical Social Work is seeking post-discharge placement for this patient at the [place] level of care (*CSW will initial, date and re-position this form in  chart as items are completed):  Yes   Patient/family provided with [place] Work Department's list of facilities offering this level of care within the geographic area requested by the patient (or if unable, by the patient's family).  Yes   Patient/family informed of their freedom to choose among providers that offer the needed level of care, that participate in Medicare, Medicaid or managed care program needed by the patient, have an available bed and are willing to accept the patient.  Yes   Patient/family informed of [facility]'s ownership interest in [facility] and [facility], as well as of the fact that they are under no obligation to receive care at these facilities.  PASRR submitted to EDS on       PASRR number received on       Existing PASRR number confirmed on 04/09/16     FL2 transmitted to all facilities in geographic area requested by pt/family on 04/09/16     FL2 transmitted to all facilities within larger geographic area on       Patient informed that his/her managed care company has contracts with or will negotiate with certain facilities, including the following:        Yes   Patient/family informed of bed offers received.  Patient chooses bed at [facility]     Physician recommends and patient chooses bed at      Patient to be transferred to [facility] on  .  Patient to be transferred to facility by       Patient family notified on   of transfer.  Name of family member notified:         PHYSICIAN       Additional Comment:    _______________________________________________ Salome Arnt, Fernando Salinas 04/09/2016, 1:53 PM [phone]

## 2016-04-09 NOTE — Evaluation (Signed)
Physical Therapy Evaluation Patient Details Name: [name] [name] MRN: [id number] DOB: 05/09/1940 Today's Date: 04/09/2016   History of Present Illness  76 y.o. female with hx of PAF on coumadin, s/p MAZE, hx of prior TIA, s/p bioprosthetic MR, hx of COPD, CKD, chronic diastolic CHF, lives alone and ambulates with a rolling walker, presented to the ER as she was having acute left lower extremity weakness.  By the time she gotten into the ER, it resolved. She had no HA, nausea, vomtiing, upper extremity weakness, or facial droop.  No slurred speech.  Work up in the ER included a Cr of 1.9,  EKG showed NSR, no leukocytosis, and INR of 2.7.  Her UA was negative.  Hospitalist was asked to admit her for TIA/CVA work up.  MRI/MRA negative.    Clinical Impression  Pt received in bed, sister present, and pt is agreeable to PT evaluation.  Pt states that she is normally modified independent with gait using a rollator walker.  She is also independent with dressing, and bathing.  Although she lives alone, her sister stays with her for a few days at a time and her granddaughter also assists her occasionally.  During PT evaluation she required Mod A +2 for supine<>sit, and sit<>stand<>transfer to the chair with RW.  Due to high risk for recurrent falls, and needing increased level of assistance she will need SNF to optimize her return to PLOF.      Follow Up Recommendations SNF (Pt states she does not want to go to the [facility], but she is willing to go to the [facility] or somewhere near [place]. )    Equipment Recommendations  None recommended by PT    Recommendations for Other Services       Precautions / Restrictions Precautions Precautions: Fall Precaution Comments: Fall during episode of weakness just prior to admission.   Restrictions Weight Bearing Restrictions: No      Mobility  Bed Mobility Overal bed mobility: Needs Assistance Bed Mobility: Supine to Sit     Supine to sit:  HOB elevated;Mod assist;+2 for physical assistance        Transfers Overall transfer level: Needs assistance Equipment used: Rolling walker (2 wheeled) Transfers: Sit to/from [facility] Sit to Stand: Mod assist;+2 physical assistance (vc's for hand placement and assistance for power up.  Pt expressed that her knees were painful. ) Stand pivot transfers: Mod assist (During transfer pt required assistance for weight shifting each direction in order to slide feet and take steps to transfer.  Pt has greater difficulty weight bearing through the L LE in order to move the R LE.  )       General transfer comment: Upon standing, pt required manual assist for upright posture, and assist for weight shift in order to slide R foot underneath the patient and gain a base of support.    Ambulation/Gait Ambulation/Gait assistance:  (NA due to pt's poor ability to transfer. )              Stairs            Wheelchair Mobility    Modified Rankin (Stroke Patients Only)       Balance Overall balance assessment: History of Falls;Needs assistance Sitting-balance support: Bilateral upper extremity supported;Feet supported Sitting balance-[name] Scale: Good     Standing balance support: Bilateral upper extremity supported Standing balance-[name] Scale: Poor  Pertinent Vitals/Pain Pain Assessment: 0-10 Pain Location: R hand - states it is due to arthritis Pain Intervention(s): Limited activity within patient's tolerance;Monitored during session;Patient requesting pain meds-RN notified    Home Living Family/patient expects to be discharged to:: Private residence Living Arrangements: Alone Available Help at Discharge: Family;Available PRN/intermittently (sister comes and stays sometimes.  Granddaughter also assists)   Home Access: Ramped entrance     Home Layout: One level Home Equipment: Other (comment);Walker - 2  wheels;Walker - 4 wheels (O2)      Prior Function Level of Independence: Needs assistance   Gait / Transfers Assistance Needed: Pt normally uses a rollator for functional mobility  ADL's / Homemaking Assistance Needed: Pt sister stays 2-3 days per week and assists with housework. Pt is incontinent, reports she is able to clean herself up; reports independence in bathing and dressing        Hand Dominance   Dominant Hand: Right    Extremity/Trunk Assessment   Upper Extremity Assessment Upper Extremity Assessment: Defer to OT evaluation    Lower Extremity Assessment Lower Extremity Assessment: Generalized weakness (While H&P states she has L LE weakness, she is actually weaker on the R LE compared to the left during MMT, but has difficulty with accepting weight through the L LE when transferring bed<>chair)    Cervical / Trunk Assessment Cervical / Trunk Assessment: Kyphotic  Communication   Communication: No difficulties  Cognition Arousal/Alertness: Awake/alert Behavior During Therapy: WFL for tasks assessed/performed Overall Cognitive Status: Within Functional Limits for tasks assessed                      General Comments      Exercises     Assessment/Plan    PT Assessment Patient needs continued PT services  PT Problem List Decreased strength;Decreased activity tolerance;Decreased balance;Decreased mobility;Decreased safety awareness;Decreased cognition;Decreased knowledge of use of DME;Decreased knowledge of precautions;Obesity          PT Treatment Interventions DME instruction;Gait training;Functional mobility training;Therapeutic activities;Therapeutic exercise;Balance training;Neuromuscular re-education;Wheelchair mobility training;Patient/family education    PT Goals (Current goals can be found in the Care Plan section)  Acute Rehab PT Goals Patient Stated Goal: To get stronger PT Goal Formulation: With patient/family Time For Goal Achievement:  04/16/16 Potential to Achieve Goals: Fair    Frequency Min 3X/week   Barriers to discharge Decreased caregiver support Pt lives alone    Co-evaluation PT/OT/SLP Co-Evaluation/Treatment: Yes Reason for Co-Treatment: For patient/therapist safety;To address functional/ADL transfers;Necessary to address cognition/behavior during functional activity PT goals addressed during session: Mobility/safety with mobility;Balance;Proper use of DME;Strengthening/ROM         End of Session Equipment Utilized During Treatment: Gait belt;Oxygen Activity Tolerance: Patient limited by pain;Patient limited by fatigue Patient left: in chair;with call bell/phone within reach;with family/visitor present Nurse Communication: Mobility status;Need for lift equipment Lattie Haw, RN present in the room during evaluation, and recommended to use STEDY For transfer back to bed.  )    Functional Assessment Tool Used: [place] "6-clicks"  Functional Limitation: Mobility: Walking and moving around Mobility: Walking and Moving Around Current Status [phone]): At least 40 percent but less than 60 percent impaired, limited or restricted Mobility: Walking and Moving Around Goal Status [phone]): At least 20 percent but less than 40 percent impaired, limited or restricted    Time: [phone] PT Time Calculation (min) (ACUTE ONLY): 36 min   Charges:   PT Evaluation $PT Eval Low Complexity: 1 Procedure PT Treatments $Therapeutic Activity: 8-22 mins  PT G Codes:   PT G-Codes **NOT FOR INPATIENT CLASS** Functional Assessment Tool Used: [employer] "6-clicks"  Functional Limitation: Mobility: Walking and moving around Mobility: Walking and Moving Around Current Status [phone]): At least 40 percent but less than 60 percent impaired, limited or restricted Mobility: Walking and Moving Around Goal Status [phone]): At least 20 percent but less than 40 percent impaired, limited or restricted    Beth  Shella Lahman, PT, DPT X: [phone]

## 2016-04-09 NOTE — ED Triage Notes (Signed)
Patient brought in by EMS from home. Complaining of swelling to lower leg and weakness in lower left leg. States she was ambulating to bathroom with sister's help when her left leg gave out and she scraped it against furniture. Patient has small laceration noted to lower left leg. Bleeding controlled at this time. States patient did not fall completely due to sister holding her up.

## 2016-04-09 NOTE — Progress Notes (Signed)
ANTICOAGULATION CONSULT NOTE - Initial Consult  Pharmacy Consult for coumadin Indication: atrial fibrillation  Allergies  Allergen Reactions  . Indomethacin Other (See Comments)    dizziness  . Norvasc [Amlodipine Besylate] Cough    Patient Measurements: Height: 5\' 2"  (157.5 cm) Weight: 162 lb (73.5 kg) IBW/kg (Calculated) : 50.1   Vital Signs: Temp: 97.8 F (36.6 C) (02/02 0748) Temp Source: Oral (02/02 0748) BP: 118/68 (02/02 0748) Pulse Rate: 64 (02/02 0748)  Labs:  Recent Labs  04/09/16 0229  HGB 11.4*  HCT 35.1*  PLT 112*  APTT 39*  LABPROT 29.8*  INR 2.77  CREATININE 1.92*    Estimated Creatinine Clearance: 23.8 mL/min (by C-G formula based on SCr of 1.92 mg/dL (H)).   Medical History: Past Medical History:  Diagnosis Date  . Aortic insufficiency due to bicuspid aortic valve    Moderate by TEE  . Arthritis   . Chronic diastolic congestive heart failure ([place])    a) ECHO (09/2013) EF 50-55%  . CKD (chronic kidney disease) stage 4, GFR 15-29 ml/min (HCC)   . COPD (chronic obstructive pulmonary disease) ([place])   . Essential hypertension, benign   . History of stroke    2000 [place]  . History of TIA (transient ischemic attack)    Diagnosed 2003  . Hyperlipidemia   . Juvenile rheumatic fever    Age 76  . 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 NICM (nonischemic cardiomyopathy) ([place])    a) LHC (04/2013) no significant CAD  . Obesity   . Paroxysmal atrial fibrillation (HCC)    s/p MAZE (05/2013)  . S/P Maze operation for atrial fibrillation 05/16/2013   Complete bilateral atrial lesion set using cryothermy and bipolar radiofrequency ablation with oversewing of LA appendage  . S/P minimally invasive mitral valve replacement with bioprosthetic valve and maze procedure 05/16/2013   27 mm Edwards magna mitral bovine bioprosthetic tissue valve placed via right thoracotomy    Medications:  Prescriptions Prior to Admission  Medication  Sig Dispense Refill Last Dose  . amiodarone (PACERONE) 200 MG tablet Take 0.5 tablets (100 mg total) by mouth daily. 30 tablet 6 Taking  . colchicine 0.6 MG tablet Take 0.5 tablets (0.3 mg total) by mouth daily. 30 tablet 0 Taking  . ferrous sulfate 325 (65 FE) MG tablet Take 325 mg by mouth 2 (two) times daily with a meal.   Taking  . levothyroxine (SYNTHROID, LEVOTHROID) 50 MCG tablet Take 50 mcg by mouth daily before breakfast.   Taking  . metolazone (ZAROXOLYN) 2.5 MG tablet Take 1 tablet (2.5 mg total) by mouth as directed. TAKE 1 TABLET AS NEEDED ONLY IF YOUR WEIGHT INCREASES 2-3 POUNDS IN 24 HOURS. (Patient taking differently: Take 2.5 mg by mouth daily. [place] Kitchen)   Taking  . omeprazole (PRILOSEC) 20 MG capsule Take 1 capsule (20 mg total) by mouth daily. Medicine to help protect your stomach lining from ulcers while on coumadin 30 capsule 3 Taking  . potassium chloride SA (K-DUR,KLOR-CON) 20 MEQ tablet Take 1 tablet (20 mEq total) by mouth daily. 60 tablet 6 Taking  . simvastatin (ZOCOR) 10 MG tablet TAKE ONE TABLET BY MOUTH EVERY EVENING. 30 tablet 3 Taking  . tolterodine (DETROL LA) 4 MG 24 hr capsule Take 4 mg by mouth daily.   Taking  . torsemide (DEMADEX) 100 MG tablet TAKE ONE TABLET BY MOUTH EVERY MORNING 90 tablet 3   . warfarin (COUMADIN) 2.5 MG tablet Take 2.5 mg by  mouth daily. At 1700   Taking    Assessment: 76 yo lady admitted with potential TIA to continue coumadin.  INR is therapeutic Goal of Therapy:  INR 2-3 Monitor platelets by anticoagulation protocol: Yes   Plan:  Coumadin 2 mg po today Daily PT/INR  [name] [name] 04/09/2016,10:24 AM

## 2016-04-09 NOTE — Evaluation (Signed)
Occupational Therapy Evaluation Patient Details Name: [name] [name] MRN: [id number] DOB: 1940-12-02 Today's Date: 04/09/2016    History of Present Illness 76 y.o. female with hx of PAF on coumadin, s/p MAZE, hx of prior TIA, s/p bioprosthetic MR, hx of COPD, CKD, chronic diastolic CHF, lives alone and ambulates with a rolling walker, presented to the ER as she was having acute left lower extremity weakness.  By the time she gotten into the ER, it resolved. She had no HA, nausea, vomtiing, upper extremity weakness, or facial droop.  No slurred speech.  Work up in the ER included a Cr of 1.9,  EKG showed NSR, no leukocytosis, and INR of 2.7.  Her UA was negative.  Hospitalist was asked to admit her for TIA/CVA work up.  MRI/MRA negative.     Clinical Impression   Pt received in bed, agreeable to OT/PT evaluation, sister present. PTA pt and sister report pt living alone and independent in B/ADL tasks, sister stays 1-3 days per week and assists with housekeeping tasks. Pt demonstrates significant weakness during evaluation, requiring mod-max assistance for all mobility tasks and ADL completion. Recommend SNF on discharge due to increased need for assistance with all B/IADL tasks and functional mobility tasks.     Follow Up Recommendations  SNF    Equipment Recommendations  None recommended by OT       Precautions / Restrictions Precautions Precautions: Fall Precaution Comments: Fall during episode of weakness just prior to admission.   Restrictions Weight Bearing Restrictions: No      Mobility Bed Mobility Overal bed mobility: Needs Assistance Bed Mobility: Supine to Sit     Supine to sit: HOB elevated;Mod assist;+2 for physical assistance        Transfers Overall transfer level: Needs assistance Equipment used: Rolling walker (2 wheeled) Transfers: Sit to/from [facility] Sit to Stand: Mod assist;+2 physical assistance (vc's for hand placement and  assistance for power up.  Pt expressed that her knees were painful. ) Stand pivot transfers: Mod assist (During transfer pt required assistance for weight shifting each direction in order to slide feet and take steps to transfer.  Pt has greater difficulty weight bearing through the L LE in order to move the R LE.  )       General transfer comment: Upon standing, pt required manual assist for upright posture, and assist for weight shift in order to slide R foot underneath the patient and gain a base of support.           ADL Overall ADL's : Needs assistance/impaired Eating/Feeding: Set up;Bed level               Upper Body Dressing : Maximal assistance;Sitting (straightening gown and managing ties)   Lower Body Dressing: Maximal assistance;Sitting/lateral leans                 General ADL Comments: Pt requiring significantly increased time for ADL completion and functional mobility tasks. During functional mobility pt shuffling feet with difficulty weightshifting and alternating steps.     Vision Vision Assessment?: No apparent visual deficits          Pertinent Vitals/Pain Pain Assessment: 0-10 Pain Location: R hand - states it is due to arthritis Pain Intervention(s): Limited activity within patient's tolerance;Monitored during session;Patient requesting pain meds-RN notified     Hand Dominance Right   Extremity/Trunk Assessment Upper Extremity Assessment Upper Extremity Assessment: Defer to OT evaluation   Lower Extremity Assessment Lower Extremity  Assessment: Generalized weakness (While H&P states she has L LE weakness, she is actually weaker on the R LE compared to the left during MMT, but has difficulty with accepting weight through the L LE when transferring bed<>chair)   Cervical / Trunk Assessment Cervical / Trunk Assessment: Kyphotic   Communication Communication Communication: No difficulties   Cognition Arousal/Alertness: Awake/alert Behavior  During Therapy: WFL for tasks assessed/performed Overall Cognitive Status: Within Functional Limits for tasks assessed                                Home Living Family/patient expects to be discharged to:: Private residence Living Arrangements: Alone Available Help at Discharge: Family;Available PRN/intermittently (sister comes and stays sometimes.  Granddaughter also assists)   Home Access: Ramped entrance     Home Layout: One level               Home Equipment: Other (comment);Walker - 2 wheels;Walker - 4 wheels (O2)          Prior Functioning/Environment Level of Independence: Needs assistance  Gait / Transfers Assistance Needed: Pt normally uses a rollator for functional mobility ADL's / Homemaking Assistance Needed: Pt sister stays 2-3 days per week and assists with housework. Pt is incontinent, reports she is able to clean herself up; reports independence in bathing and dressing            OT Problem List: Decreased strength;Decreased activity tolerance;Impaired balance (sitting and/or standing);Decreased safety awareness;Impaired UE functional use;Pain                    Co-evaluation PT/OT/SLP Co-Evaluation/Treatment: Yes Reason for Co-Treatment: For patient/therapist safety;To address functional/ADL transfers;Necessary to address cognition/behavior during functional activity PT goals addressed during session: Mobility/safety with mobility;Balance;Proper use of DME;Strengthening/ROM OT goals addressed during session: ADL's and self-care;Proper use of Adaptive equipment and DME;Other (comment) (functional mobility and transfer tasks)      End of Session Equipment Utilized During Treatment: Gait belt;Rolling walker  Activity Tolerance: Patient tolerated treatment well Patient left: in chair;with call bell/phone within reach;with family/visitor present   Time: [id number] OT Time Calculation (min): 33 min Charges:  OT General Charges $OT  Visit: 1 Procedure OT Evaluation $OT Eval Low Complexity: 1 Procedure   [name] [name], OTR/L  [phone] 04/09/2016, 9:58 AM

## 2016-04-09 NOTE — Clinical Social Work Placement (Signed)
   CLINICAL SOCIAL WORK PLACEMENT  NOTE  Date:  04/09/2016  Patient Details  Name: [name] [name] MRN: [id number] Date of Birth: [birth year]  Clinical Social Work is seeking post-discharge placement for this patient at the [place] level of care (*CSW will initial, date and re-position this form in  chart as items are completed):  Yes   Patient/family provided with [facility] Work Department's list of facilities offering this level of care within the geographic area requested by the patient (or if unable, by the patient's family).  Yes   Patient/family informed of their freedom to choose among providers that offer the needed level of care, that participate in Medicare, Medicaid or managed care program needed by the patient, have an available bed and are willing to accept the patient.  Yes   Patient/family informed of [facility]'s ownership interest in [facility] and [facility], as well as of the fact that they are under no obligation to receive care at these facilities.  PASRR submitted to EDS on       PASRR number received on       Existing PASRR number confirmed on 04/09/16     FL2 transmitted to all facilities in geographic area requested by pt/family on 04/09/16     FL2 transmitted to all facilities within larger geographic area on       Patient informed that his/her managed care company has contracts with or will negotiate with certain facilities, including the following:            Patient/family informed of bed offers received.  Patient chooses bed at       Physician recommends and patient chooses bed at      Patient to be transferred to   on  .  Patient to be transferred to facility by       Patient family notified on   of transfer.  Name of family member notified:        PHYSICIAN       Additional Comment:    _______________________________________________ Salome Arnt, LCSW 04/09/2016, 1:20  PM [phone]

## 2016-04-09 NOTE — ED Provider Notes (Signed)
[facility] DEPT Provider Note   CSN: [id number] Arrival date & time: 04/09/16  0144  Time seen 02:00 AM   History   Chief Complaint Chief Complaint  Patient presents with  . Fall    HPI [name] [name] is a 76 y.o. female.  HPI  patient uses a walker and had gone to the bathroom with her walker. This was around midnight. However when she was coming out she states her left foot wouldn't move and she couldn't pick it up. She called to her sister who is in the living room for help. When her sister got there she said the patient's Gilford Rile has wheels and it was just sliding. She states she went over to help her sister and her sister slowly went down to the floor. She did hit her left leg on the door jam. She has some bleeding from that. Otherwise she did not hit the ground hard. She did not hit her head or have loss of consciousness. They state that the weakness in her left leg lasted until after EMS arrived. She states now it seems improved. She denies shortness of breath or chest pain but does states she's has more fluid in her legs than normal. Patient is on Coumadin because she has a porcine heart valve.  PCP [name], MD   Past Medical History:  Diagnosis Date  . Aortic insufficiency due to bicuspid aortic valve    Moderate by TEE  . Arthritis   . Chronic diastolic congestive heart failure ([place])    a) ECHO (09/2013) EF 50-55%  . CKD (chronic kidney disease) stage 4, GFR 15-29 ml/min (HCC)   . COPD (chronic obstructive pulmonary disease) ([place])   . Essential hypertension, benign   . History of stroke    2000 [place]  . History of TIA (transient ischemic attack)    Diagnosed 2003  . Hyperlipidemia   . Juvenile rheumatic fever    Age 72  . 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 NICM (nonischemic cardiomyopathy) ([place])    a) LHC (04/2013) no significant CAD  . Obesity   . Paroxysmal atrial fibrillation (HCC)    s/p MAZE (05/2013)  . S/P Maze  operation for atrial fibrillation 05/16/2013   Complete bilateral atrial lesion set using cryothermy and bipolar radiofrequency ablation with oversewing of LA appendage  . S/P minimally invasive mitral valve replacement with bioprosthetic valve and maze procedure 05/16/2013   27 mm Edwards magna mitral bovine bioprosthetic tissue valve placed via right thoracotomy    Patient Active Problem List   Diagnosis Date Noted  . CKD (chronic kidney disease) stage 3, GFR 30-59 ml/min 06/25/2015  . AKI (acute kidney injury) ([place]) 06/25/2015  . Hypoxia 11/02/2014  . Diastolic CHF, acute on chronic (HCC) 11/02/2014  . CAP (community acquired pneumonia) 11/02/2014  . Anemia, iron deficiency 05/09/2014  . UTI (urinary tract infection) 05/09/2014  . Obesity 05/08/2014  . Elevated troponin 05/08/2014  . Hypothyroidism 05/08/2014  . Acute respiratory failure with hypoxia ([place]) 11/14/2013  . Fx lumbar vertebra-closed ([place]) 11/12/2013  . Lumbar vertebral fracture ([place]) 11/11/2013  . Fall at home 11/11/2013  . DNR (do not resuscitate) 10/23/2013  . Anemia in chronic kidney disease 10/20/2013  . Acute on chronic diastolic heart failure ([place]) 10/14/2013  . Streptococcal bacteremia 10/08/2013  . Congenital bicuspid aortic valve 10/08/2013  . Acute on chronic systolic heart failure ([place]) 10/03/2013  . Acute on chronic renal failure ([place]) 10/03/2013  .  Long term current use of anticoagulant therapy 06/22/2013  . Chronic systolic heart failure ([place]) 06/18/2013  . S/P MVR (mitral valve replacement) 06/04/2013  . CVA (cerebral infarction) 05/21/2013  . Acute pulmonary edema ([place]) 05/18/2013  . 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 S/P Maze operation for atrial fibrillation 05/16/2013  . Aortic insufficiency due to bicuspid aortic valve   . Chronic kidney disease   . Paroxysmal atrial fibrillation (HCC)   . Chronic diastolic congestive heart  failure ([place])   . Arthritis   . Acute respiratory failure ([place]) 05/02/2013  . Atrial fibrillation with RVR ([place]) 04/29/2013  . Shortness of breath   . Juvenile rheumatic fever   . Mitral regurgitation 02/16/2013  . HTN (hypertension) 02/15/2011    Past Surgical History:  Procedure Laterality Date  . ABDOMINAL HYSTERECTOMY     Cervical Cancer  . CARDIOVERSION N/A 06/06/2013   Procedure: CARDIOVERSION;  Surgeon: Larey Dresser, MD;  Location: [facility];  Service: Cardiovascular;  Laterality: N/A;  . INTRAOPERATIVE TRANSESOPHAGEAL ECHOCARDIOGRAM N/A 05/16/2013   Procedure: INTRAOPERATIVE TRANSESOPHAGEAL ECHOCARDIOGRAM;  Surgeon: Rexene Alberts, MD;  Location: [facility];  Service: Open Heart Surgery;  Laterality: N/A;  . LEFT AND RIGHT HEART CATHETERIZATION WITH CORONARY ANGIOGRAM N/A 05/04/2013   Procedure: LEFT AND RIGHT HEART CATHETERIZATION WITH CORONARY ANGIOGRAM;  Surgeon: Jettie Booze, MD;  Location: [facility] CATH LAB;  Service: Cardiovascular;  Laterality: N/A;  . MINIMALLY INVASIVE MAZE PROCEDURE N/A 05/16/2013   Procedure: MINIMALLY INVASIVE MAZE PROCEDURE;  Surgeon: Rexene Alberts, MD;  Location: [facility];  Service: Open Heart Surgery;  Laterality: N/A;  . MITRAL VALVE REPLACEMENT Right 05/16/2013   Procedure: MINIMALLY INVASIVE MITRAL VALVE (MV) REPLACEMENT;  Surgeon: Rexene Alberts, MD;  Location: [facility];  Service: Open Heart Surgery;  Laterality: Right;  . Parathyroid/Thyroid surgery     Tumor  . TEE WITHOUT CARDIOVERSION N/A 04/06/2013   Procedure: TRANSESOPHAGEAL ECHOCARDIOGRAM (TEE);  Surgeon: [name], MD;  [facility];  Service: Cardiology;  Laterality: N/A;  . TEE WITHOUT CARDIOVERSION N/A 06/06/2013   Procedure: TRANSESOPHAGEAL ECHOCARDIOGRAM (TEE);  Surgeon: Larey Dresser, MD;  Location: [facility];  Service: Cardiovascular;  Laterality: N/A;  . TOTAL KNEE ARTHROPLASTY Right     OB History    No data available       Home Medications    Prior  to Admission medications   Medication Sig Start Date End Date Taking? Authorizing Provider  amiodarone (PACERONE) 200 MG tablet Take 0.5 tablets (100 mg total) by mouth daily. 10/10/14   [name], MD  colchicine 0.6 MG tablet Take 0.5 tablets (0.3 mg total) by mouth daily. 10/11/13   Rande Brunt, NP  ferrous sulfate 325 (65 FE) MG tablet Take 325 mg by mouth 2 (two) times daily with a meal.    Historical Provider, MD  levothyroxine (SYNTHROID, LEVOTHROID) 50 MCG tablet Take 50 mcg by mouth daily before breakfast.    Historical Provider, MD  metolazone (ZAROXOLYN) 2.5 MG tablet Take 1 tablet (2.5 mg total) by mouth as directed. TAKE 1 TABLET AS NEEDED ONLY IF YOUR WEIGHT INCREASES 2-3 POUNDS IN 24 HOURS. Patient taking differently: Take 2.5 mg by mouth daily. . 05/13/14   Rexene Alberts, MD  omeprazole (PRILOSEC) 20 MG capsule Take 1 capsule (20 mg total) by mouth daily. Medicine to help protect your stomach lining from ulcers while on coumadin 05/13/14   Rexene Alberts,  MD  potassium chloride SA (K-DUR,KLOR-CON) 20 MEQ tablet Take 1 tablet (20 mEq total) by mouth daily. 05/30/13   Donielle Liston Alba, PA-C  simvastatin (ZOCOR) 10 MG tablet TAKE ONE TABLET BY MOUTH EVERY EVENING. 12/10/13   Tiffany L Reed, DO  tolterodine (DETROL LA) 4 MG 24 hr capsule Take 4 mg by mouth daily.    Historical Provider, MD  torsemide (DEMADEX) 100 MG tablet TAKE ONE TABLET BY MOUTH EVERY MORNING 03/05/16   [name], MD  warfarin (COUMADIN) 2.5 MG tablet Take 2.5 mg by mouth daily. At 1700    Historical Provider, MD    Family History Family History  Problem Relation Age of Onset  . Heart failure Father   . Heart attack Brother     Social History Social History  Substance Use Topics  . Smoking status: Never Smoker  . Smokeless tobacco: Never Used  . Alcohol use No  lives at home Lives alone (sister was visiting tonight) Uses a walker On oxygen 2 lpm [place] 24/7   Allergies   Indomethacin and  Norvasc [amlodipine besylate]   Review of Systems Review of Systems  All other systems reviewed and are negative.    Physical Exam Updated Vital Signs BP (!) 124/46 (BP Location: Left Arm)   Pulse 66   Temp 97.7 F (36.5 C) (Oral)   Resp 18   Ht 5\' 2"  (1.575 m)   Wt 162 lb (73.5 kg)   SpO2 97%   BMI 29.63 kg/m   Vital signs normal    Physical Exam  Constitutional: She is oriented to person, place, and time. She appears well-developed and well-nourished.  Non-toxic appearance. She does not appear ill. No distress.  HENT:  Head: Normocephalic and atraumatic.  Right Ear: External ear normal.  Left Ear: External ear normal.  Nose: Nose normal. No mucosal edema or rhinorrhea.  Mouth/Throat: Oropharynx is clear and moist and mucous membranes are normal. No dental abscesses or uvula swelling.  Eyes: Conjunctivae and EOM are normal. Pupils are equal, round, and reactive to light.  Neck: Normal range of motion and full passive range of motion without pain. Neck supple.  Cardiovascular: Normal rate, regular rhythm and normal heart sounds.  Exam reveals no gallop and no friction rub.   No murmur heard. Pulmonary/Chest: Effort normal and breath sounds normal. No respiratory distress. She has no wheezes. She has no rhonchi. She has no rales. She exhibits no tenderness and no crepitus.  Abdominal: Soft. Normal appearance and bowel sounds are normal. She exhibits no distension. There is no tenderness. There is no rebound and no guarding.  Musculoskeletal: Normal range of motion. She exhibits no edema or tenderness.  Moves all extremities well.   Neurological: She is alert and oriented to person, place, and time. She has normal strength. No cranial nerve deficit.  No pronator drift, grips are equal, patient can flex both knees, she is unable to lift her right leg straight off the stretcher. She can do it with her left leg. She has intact dorsiflexion and plantar flexion of her left foot  now. Although patient keeps staying her foot would not move, her sister said her whole left leg would not move earlier.  Skin: Skin is warm, dry and intact. No rash noted. No erythema. No pallor.  Patient has thickening and discoloration of her lower extremities. There is a half centimeter abrasion over the proximal left lower extremity with mild bleeding.  Psychiatric: She has a normal mood and  affect. Her speech is normal and behavior is normal. Her mood appears not anxious.  Nursing note and vitals reviewed.    ED Treatments / Results  Labs (all labs ordered are listed, but only abnormal results are displayed) Results for orders placed or performed during the hospital encounter of 04/09/16  Protime-INR  Result Value Ref Range   Prothrombin Time 29.8 (H) 11.4 - 15.2 seconds   INR 2.77   APTT  Result Value Ref Range   aPTT 39 (H) 24 - 36 seconds  CBC  Result Value Ref Range   WBC 4.7 4.0 - 10.5 K/uL   RBC 3.58 (L) 3.87 - 5.11 MIL/uL   Hemoglobin 11.4 (L) 12.0 - 15.0 g/dL   HCT 35.1 (L) 36.0 - 46.0 %   MCV 98.0 78.0 - 100.0 fL   MCH 31.8 26.0 - 34.0 pg   MCHC 32.5 30.0 - 36.0 g/dL   RDW 15.5 11.5 - 15.5 %   Platelets 112 (L) 150 - 400 K/uL  Differential  Result Value Ref Range   Neutrophils Relative % 80 %   Neutro Abs 3.8 1.7 - 7.7 K/uL   Lymphocytes Relative 8 %   Lymphs Abs 0.4 (L) 0.7 - 4.0 K/uL   Monocytes Relative 9 %   Monocytes Absolute 0.4 0.1 - 1.0 K/uL   Eosinophils Relative 3 %   Eosinophils Absolute 0.1 0.0 - 0.7 K/uL   Basophils Relative 0 %   Basophils Absolute 0.0 0.0 - 0.1 K/uL   RBC Morphology ELLIPTOCYTES    Smear Review PLATELETS APPEAR ADEQUATE   Comprehensive metabolic panel  Result Value Ref Range   Sodium 140 135 - 145 mmol/L   Potassium 3.9 3.5 - 5.1 mmol/L   Chloride 98 (L) 101 - 111 mmol/L   CO2 32 22 - 32 mmol/L   Glucose, Bld 113 (H) 65 - 99 mg/dL   BUN 33 (H) 6 - 20 mg/dL   Creatinine, Ser 1.92 (H) 0.44 - 1.00 mg/dL   Calcium 9.2 8.9  - 10.3 mg/dL   Total Protein 6.9 6.5 - 8.1 g/dL   Albumin 3.3 (L) 3.5 - 5.0 g/dL   AST 27 15 - 41 U/L   ALT 10 (L) 14 - 54 U/L   Alkaline Phosphatase 127 (H) 38 - 126 U/L   Total Bilirubin 1.2 0.3 - 1.2 mg/dL   GFR calc non Af Amer 24 (L) >60 mL/min   GFR calc Af Amer 28 (L) >60 mL/min   Anion gap 10 5 - 15  I-stat troponin, ED (not at [facility])  Result Value Ref Range   Troponin i, poc 0.02 0.00 - 0.08 ng/mL   Comment 3           Laboratory interpretation all normal except stable renal insufficiency, therapeutic INR, mild anemia    EKG  EKG Interpretation  Date/Time:  Friday April 09 2016 03:00:12 EST Ventricular Rate:  64 PR Interval:    QRS Duration: 122 QT Interval:  481 QTC Calculation: 497 R Axis:   -32 Text Interpretation:  Junctional rhythm Left bundle branch block Since last tracing rate slower 24 Jun 2015 Confirmed by Caelynn Marshman  MD-I, Darianny Momon ([id number]) on 04/09/2016 3:06:28 AM       Radiology Ct Head Wo Contrast  Result Date: 04/09/2016 CLINICAL DATA:  Weakness and left lower extremity.  Now resolved. EXAM: CT HEAD WITHOUT CONTRAST TECHNIQUE: Contiguous axial images were obtained from the base of the skull through the vertex without intravenous contrast.  COMPARISON:  Head CT 11/15/2013 FINDINGS: Brain: No evidence of acute infarction, hemorrhage, hydrocephalus, extra-axial collection or mass lesion/mass effect. Moderate cerebral atrophy and chronic small vessel ischemia. Remote lacunar infarcts in the basal ganglia bilaterally. Vascular: Atherosclerosis of skullbase vasculature without hyperdense vessel or abnormal calcification. Skull: Normal. Negative for fracture or focal lesion. Sinuses/Orbits: Paranasal sinuses and mastoid air cells are clear. The visualized orbits are unremarkable. Bilateral cataract resection. Other: None. IMPRESSION: No acute intracranial abnormality. Atrophy, remote and chronic small vessel ischemic change. Electronically Signed   By: Jeb Levering  M.D.   On: 04/09/2016 03:07    Procedures Procedures (including critical care time)  Medications Ordered in ED Medications - No data to display   Initial Impression / Assessment and Plan / ED Course  I have reviewed the triage vital signs and the nursing notes.  Pertinent labs & imaging results that were available during my care of the patient were reviewed by me and considered in my medical decision making (see chart for details).    Not sure if patient may have had a TIA, although she is on coumadin. CODE Stroke orders followed, but code stroke not called.   Review of her prior studies shows her last echocardiogram was 2 years ago in March, she has not had Doppler ultrasounds of her carotids done.  ABCD2 score is 4  04:00 AM Dr Marin Comment, Hospitalist, will admit    Final Clinical Impressions(s) / ED Diagnoses   Final diagnoses:  Transient cerebral ischemia, unspecified type  Transient weakness of left lower extremity    Plan admission  Rolland Porter, MD, Barbette Or, MD 04/09/16 [phone]

## 2016-04-10 ENCOUNTER — Encounter (HOSPITAL_COMMUNITY): Payer: Self-pay | Admitting: Internal Medicine

## 2016-04-10 ENCOUNTER — Inpatient Hospital Stay
Admission: RE | Admit: 2016-04-10 | Discharge: 2016-04-29 | Disposition: A | Discharge status: Nursing Home (Includes ICF) | Payer: Medicare HMO | Source: Ambulatory Visit | Attending: Internal Medicine | Admitting: Internal Medicine

## 2016-04-10 DIAGNOSIS — I272 Pulmonary hypertension, unspecified: Secondary | ICD-10-CM | POA: Diagnosis not present

## 2016-04-10 DIAGNOSIS — Z8673 Personal history of transient ischemic attack (TIA), and cerebral infarction without residual deficits: Secondary | ICD-10-CM | POA: Diagnosis not present

## 2016-04-10 DIAGNOSIS — I130 Hypertensive heart and chronic kidney disease with heart failure and stage 1 through stage 4 chronic kidney disease, or unspecified chronic kidney disease: Secondary | ICD-10-CM | POA: Diagnosis not present

## 2016-04-10 DIAGNOSIS — I480 Paroxysmal atrial fibrillation: Secondary | ICD-10-CM | POA: Diagnosis not present

## 2016-04-10 DIAGNOSIS — E784 Other hyperlipidemia: Secondary | ICD-10-CM | POA: Diagnosis not present

## 2016-04-10 DIAGNOSIS — I5033 Acute on chronic diastolic (congestive) heart failure: Secondary | ICD-10-CM | POA: Diagnosis not present

## 2016-04-10 DIAGNOSIS — E038 Other specified hypothyroidism: Secondary | ICD-10-CM | POA: Diagnosis not present

## 2016-04-10 DIAGNOSIS — R5381 Other malaise: Secondary | ICD-10-CM | POA: Diagnosis not present

## 2016-04-10 DIAGNOSIS — Z7901 Long term (current) use of anticoagulants: Secondary | ICD-10-CM | POA: Diagnosis not present

## 2016-04-10 DIAGNOSIS — Z5189 Encounter for other specified aftercare: Secondary | ICD-10-CM | POA: Diagnosis not present

## 2016-04-10 DIAGNOSIS — K922 Gastrointestinal hemorrhage, unspecified: Secondary | ICD-10-CM | POA: Diagnosis not present

## 2016-04-10 DIAGNOSIS — I4891 Unspecified atrial fibrillation: Secondary | ICD-10-CM | POA: Diagnosis not present

## 2016-04-10 DIAGNOSIS — I5022 Chronic systolic (congestive) heart failure: Secondary | ICD-10-CM | POA: Diagnosis not present

## 2016-04-10 DIAGNOSIS — R29898 Other symptoms and signs involving the musculoskeletal system: Secondary | ICD-10-CM | POA: Diagnosis not present

## 2016-04-10 DIAGNOSIS — K633 Ulcer of intestine: Secondary | ICD-10-CM | POA: Diagnosis not present

## 2016-04-10 DIAGNOSIS — D631 Anemia in chronic kidney disease: Secondary | ICD-10-CM

## 2016-04-10 DIAGNOSIS — Z66 Do not resuscitate: Secondary | ICD-10-CM

## 2016-04-10 DIAGNOSIS — Z953 Presence of xenogenic heart valve: Secondary | ICD-10-CM

## 2016-04-10 DIAGNOSIS — D61818 Other pancytopenia: Secondary | ICD-10-CM | POA: Diagnosis not present

## 2016-04-10 DIAGNOSIS — M1990 Unspecified osteoarthritis, unspecified site: Secondary | ICD-10-CM | POA: Diagnosis not present

## 2016-04-10 DIAGNOSIS — D696 Thrombocytopenia, unspecified: Secondary | ICD-10-CM | POA: Diagnosis not present

## 2016-04-10 DIAGNOSIS — J810 Acute pulmonary edema: Secondary | ICD-10-CM | POA: Diagnosis not present

## 2016-04-10 DIAGNOSIS — I5042 Chronic combined systolic (congestive) and diastolic (congestive) heart failure: Secondary | ICD-10-CM | POA: Diagnosis not present

## 2016-04-10 DIAGNOSIS — J9611 Chronic respiratory failure with hypoxia: Secondary | ICD-10-CM | POA: Diagnosis not present

## 2016-04-10 DIAGNOSIS — J181 Lobar pneumonia, unspecified organism: Secondary | ICD-10-CM | POA: Diagnosis not present

## 2016-04-10 DIAGNOSIS — D509 Iron deficiency anemia, unspecified: Secondary | ICD-10-CM | POA: Diagnosis not present

## 2016-04-10 DIAGNOSIS — N3281 Overactive bladder: Secondary | ICD-10-CM | POA: Diagnosis not present

## 2016-04-10 DIAGNOSIS — M79642 Pain in left hand: Secondary | ICD-10-CM | POA: Diagnosis not present

## 2016-04-10 DIAGNOSIS — K2970 Gastritis, unspecified, without bleeding: Secondary | ICD-10-CM | POA: Diagnosis present

## 2016-04-10 DIAGNOSIS — I5032 Chronic diastolic (congestive) heart failure: Secondary | ICD-10-CM | POA: Diagnosis not present

## 2016-04-10 DIAGNOSIS — E785 Hyperlipidemia, unspecified: Secondary | ICD-10-CM | POA: Diagnosis present

## 2016-04-10 DIAGNOSIS — D124 Benign neoplasm of descending colon: Secondary | ICD-10-CM | POA: Diagnosis present

## 2016-04-10 DIAGNOSIS — K921 Melena: Secondary | ICD-10-CM | POA: Diagnosis not present

## 2016-04-10 DIAGNOSIS — Z8249 Family history of ischemic heart disease and other diseases of the circulatory system: Secondary | ICD-10-CM | POA: Diagnosis not present

## 2016-04-10 DIAGNOSIS — Z90710 Acquired absence of both cervix and uterus: Secondary | ICD-10-CM | POA: Diagnosis not present

## 2016-04-10 DIAGNOSIS — D649 Anemia, unspecified: Secondary | ICD-10-CM | POA: Diagnosis not present

## 2016-04-10 DIAGNOSIS — Q231 Congenital insufficiency of aortic valve: Secondary | ICD-10-CM | POA: Diagnosis not present

## 2016-04-10 DIAGNOSIS — I639 Cerebral infarction, unspecified: Secondary | ICD-10-CM | POA: Diagnosis not present

## 2016-04-10 DIAGNOSIS — I428 Other cardiomyopathies: Secondary | ICD-10-CM | POA: Diagnosis not present

## 2016-04-10 DIAGNOSIS — Z96651 Presence of right artificial knee joint: Secondary | ICD-10-CM | POA: Diagnosis present

## 2016-04-10 DIAGNOSIS — Z8541 Personal history of malignant neoplasm of cervix uteri: Secondary | ICD-10-CM | POA: Diagnosis not present

## 2016-04-10 DIAGNOSIS — I10 Essential (primary) hypertension: Secondary | ICD-10-CM | POA: Diagnosis not present

## 2016-04-10 DIAGNOSIS — Z9981 Dependence on supplemental oxygen: Secondary | ICD-10-CM | POA: Diagnosis not present

## 2016-04-10 DIAGNOSIS — I519 Heart disease, unspecified: Secondary | ICD-10-CM

## 2016-04-10 DIAGNOSIS — I509 Heart failure, unspecified: Secondary | ICD-10-CM | POA: Diagnosis not present

## 2016-04-10 DIAGNOSIS — I351 Nonrheumatic aortic (valve) insufficiency: Secondary | ICD-10-CM | POA: Diagnosis not present

## 2016-04-10 DIAGNOSIS — J449 Chronic obstructive pulmonary disease, unspecified: Secondary | ICD-10-CM | POA: Diagnosis present

## 2016-04-10 DIAGNOSIS — N184 Chronic kidney disease, stage 4 (severe): Secondary | ICD-10-CM | POA: Diagnosis not present

## 2016-04-10 DIAGNOSIS — N179 Acute kidney failure, unspecified: Secondary | ICD-10-CM | POA: Diagnosis not present

## 2016-04-10 DIAGNOSIS — E039 Hypothyroidism, unspecified: Secondary | ICD-10-CM | POA: Diagnosis not present

## 2016-04-10 DIAGNOSIS — K219 Gastro-esophageal reflux disease without esophagitis: Secondary | ICD-10-CM | POA: Diagnosis not present

## 2016-04-10 DIAGNOSIS — K625 Hemorrhage of anus and rectum: Secondary | ICD-10-CM | POA: Diagnosis not present

## 2016-04-10 DIAGNOSIS — N183 Chronic kidney disease, stage 3 (moderate): Secondary | ICD-10-CM | POA: Diagnosis not present

## 2016-04-10 DIAGNOSIS — I5189 Other ill-defined heart diseases: Secondary | ICD-10-CM | POA: Diagnosis present

## 2016-04-10 HISTORY — DX: Other ill-defined heart diseases: I51.89

## 2016-04-10 HISTORY — DX: Pulmonary hypertension, unspecified: I27.20

## 2016-04-10 HISTORY — DX: Chronic respiratory failure with hypoxia: J96.11

## 2016-04-10 LAB — PROTIME-INR
INR: 3.45
PROTHROMBIN TIME: 35.6 s — AB (ref 11.4–15.2)

## 2016-04-10 LAB — CBC
HCT: 32.9 % — ABNORMAL LOW (ref 36.0–46.0)
Hemoglobin: 10.4 g/dL — ABNORMAL LOW (ref 12.0–15.0)
MCH: 31.1 pg (ref 26.0–34.0)
MCHC: 31.6 g/dL (ref 30.0–36.0)
MCV: 98.5 fL (ref 78.0–100.0)
PLATELETS: 104 10*3/uL — AB (ref 150–400)
RBC: 3.34 MIL/uL — AB (ref 3.87–5.11)
RDW: 15.6 % — AB (ref 11.5–15.5)
WBC: 3 10*3/uL — AB (ref 4.0–10.5)

## 2016-04-10 LAB — BASIC METABOLIC PANEL
ANION GAP: 9 (ref 5–15)
BUN: 34 mg/dL — ABNORMAL HIGH (ref 6–20)
CALCIUM: 8.7 mg/dL — AB (ref 8.9–10.3)
CO2: 31 mmol/L (ref 22–32)
Chloride: 97 mmol/L — ABNORMAL LOW (ref 101–111)
Creatinine, Ser: 1.77 mg/dL — ABNORMAL HIGH (ref 0.44–1.00)
GFR, EST AFRICAN AMERICAN: 31 mL/min — AB (ref >60)
GFR, EST NON AFRICAN AMERICAN: 27 mL/min — AB (ref >60)
Glucose, Bld: 90 mg/dL (ref 65–99)
POTASSIUM: 3.6 mmol/L (ref 3.5–5.1)
SODIUM: 137 mmol/L (ref 135–145)

## 2016-04-10 LAB — T4, FREE: Free T4: 1.41 ng/dL — ABNORMAL HIGH (ref 0.61–1.12)

## 2016-04-10 LAB — HEMOGLOBIN A1C
HEMOGLOBIN A1C: 5.6 % (ref 4.8–5.6)
MEAN PLASMA GLUCOSE: 114 mg/dL

## 2016-04-10 LAB — VITAMIN B12: VITAMIN B 12: 219 pg/mL (ref 180–914)

## 2016-04-10 MED ORDER — LISINOPRIL 5 MG PO TABS: 2.5000 mg | ORAL_TABLET | Freq: Every day | ORAL | Status: DC

## 2016-04-10 MED ORDER — POTASSIUM CHLORIDE CRYS ER 20 MEQ PO TBCR
20.0000 meq | EXTENDED_RELEASE_TABLET | Freq: Every day | ORAL | Status: DC
  Administered 2016-04-10: 20 meq via ORAL
  Filled 2016-04-10: qty 1

## 2016-04-10 MED ORDER — TORSEMIDE 20 MG PO TABS
20.0000 mg | ORAL_TABLET | Freq: Every day | ORAL | Status: DC
  Administered 2016-04-10: 20 mg via ORAL
  Filled 2016-04-10: qty 1

## 2016-04-10 MED ORDER — WARFARIN SODIUM 2.5 MG PO TABS: ORAL_TABLET | ORAL | Status: DC

## 2016-04-10 MED ORDER — LISINOPRIL 2.5 MG PO TABS: 2.5000 mg | ORAL_TABLET | Freq: Every day | ORAL | Status: DC

## 2016-04-10 NOTE — Discharge Summary (Addendum)
Physician Discharge Summary  [name] [name] [id number] DOB: [birth year] DOA: 04/09/2016  PCP: [name], MD  Admit date: 04/09/2016 Discharge date: 04/10/2016  Time spent: Greater than 30 minutes  Recommendations for Outpatient Follow-up:  1. Recommend follow-up INR daily or per protocol by SNF.  2. Recommend follow-up with the patient's renal function in 5-7 days. 3. Free T4 and vitamin B12 level were pending at the time of discharge, recommend follow-up on the results. 4. Consider elective outpatient cardiology consult for worsened EF. 5. Patient is being discharged to the [facility].   Discharge Diagnoses:  1. Transient weakness of the left lower extremity, likely due to deconditioning. 2. Progressive defect conditioning with nontraumatic falls at home. 3. Chronic diastolic congestive heart failure. 4. Systolic dysfunction without active heart failure. EF was 35-40% per echo 04/09/16. 5. Pulmonary hypertension-66 mm Hg, per echo 04/09/16. Etiology, clinically undetermined.  6. Chronic hypoxic respiratory failure; the patient is on 2 L nasal cannula oxygen chronically.  7. History of-Status post mitral valve replacement with bioprosthetic valve and Maze procedure. 8. Paroxysmal atrial fibrillation, on chronic Coumadin.  9. Essential hypertension. 10. Hypothyroidism. 11. 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 12. Chronic thrombocytopenia of unknown etiology. 13. Anemia and chronic kidney disease. 14. DO NOT RESUSCITATE status.    Discharge Condition: Improved   Diet recommendation: Heart healthy   Filed Weights   04/09/16 0154  Weight: 73.5 kg (162 lb)    History of present illness:  Patient is a 76 year old woman with a history of PAF on Coumadin, status post replacement of mitral valve with bioprosthetic valve and Maze procedure, chronic thrombocytopenia, chronic diastolic congestive  heart failure, hypertension, and CKD. She was admitted for left leg weakness. In the ED, she was afebrile and hemodynamically stable. CT scan of the head revealed no acute intracranial abnormality. Her creatinine was 1.92. Her INR was therapeutic at 2.7.She was admitted for further evaluation and management.   Hospital Course:  1. Transient left lower extremity weakness. The patient was started on supportive treatment and gentle IV fluids. For evaluation, a number of studies were ordered. MRI of the brain revealed no acute intracranial abnormality, but with chronic small vessel disease. MRA of her brain was essentially negative. Her carotid ultrasound revealed some artherosclerotic vascular disease bilaterally, but no significant ICA stenosis. Her 2-D echocardiogram revealed an EF of 35-40% and pulmonary hypertension. Her TSH was slightly elevated at 4.7. Free T4 was ordered and was pending. Vitamin B12 level was ordered and the result was pending at the time of discharge. The physical therapist evaluated the patient and recommended short-term SNF for rehabilitation for which the patient and family agreed. -It was believed that the patient's lower extremity weakness which was bilateral in general, was likely due to physical deconditioning. Radiographically and clinically, she did not have a stroke or TIA.  Chronic diastolic heart failure with new systolic dysfunction without evidence of heart failure. Patient is treated chronically with torsemide and metolazone. Both were withheld on admission as the patient appeared to be dry. There was no clinical evidence of decompensated CHF. -For evaluation of possible TIA, 2-D echocardiogram was ordered and it revealed an EF of 35-40%, worsening hypokinesis in the inferior/inferior septal walls and pulmonary hypertension. Per the echo from 3/ 2016, her EF decreased from 50-55%. -ACE-I/ARB was avoided due to her CKD and her intermittently soft blood pressures. Beta  blocker was also considered but not added  due to her intermittent bradycardia. -Torsemide was restarted; metolazone restarted as needed for increase in edema. -Recommend outpatient consultation with cardiology for further management in the setting of holding ACE inhibitor and beta blocker due to CKD and intermittent bradycardia.  Paroxysmal atrial fibrillation-chronic. Patient is treated chronically with amiodarone and Coumadin for anticoagulation. Her INR was therapeutic on admission. She was continued on Coumadin. At the time of discharge, her INR is 3.45. The pharmacist recommended holding the Coumadin tonight, 04/10/16. -Restart Coumadin 2.5 mg daily or per the protocol by the SNF PCP on 04/11/16. Would recommend close monitoring of her INR.  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 Patient's creatinine was 1.92 on admission. Per chart review, her creatinine ranged from 1.56-1.91 nine months ago. -Torsemide and metolazone were withheld. She was started on gentle IV fluids. -Her creatinine improved to 1.77. -Torsemide was restarted, but metolazone was changed when necessary.  Chronic anemia and thrombocytopenia. Patient has a history of chronic anemia secondary to CK D and per chart review, it appears that she has chronic thrombocytopenia. Etiology of her thrombocytopenia is unknown, but could be in part due to Coumadin therapy. -Her hemoglobin ranged from 11.4-10.4. Her platelet count ranged from 104-112. -Vitamin B12 level was ordered for evaluation and was pending. -Recommend periodic monitoring.  Hypothyroidism. Patient was restarted on Synthroid. Her TSH was slightly elevated at 4.7. Free T4 was ordered but was pending at the time of discharge. Would recommend following up on the results and if it is low, consider increasing the dose of Synthroid.     Procedures: 2-D echo 04/09/16: Study Conclusions  - Left  ventricle: Poor acoustic windows limit study Endocardium is   diffficult to see. Hypokinesis appears to be worse in thei   inferior/inferoseptal walls. The cavity size was normal. Wall   thickness was increased in a pattern of mild LVH. Systolic   function was moderately reduced. The estimated ejection fraction   was in the range of 35% to 40%. - Aortic valve: AV is functionally bicuspid It is thickened,   calcified Peak and mean gradients through the valve are 18 and 11   mm Hg respectively. There was moderate regurgitation. - Mitral valve: MV prosthesis is well seated. Difficult to see   leaflets well. Peak and mean gradients through the valve are 12   and 3 mm Hg respectively MVA by P T1/2 is 2 cm2. Valve area by   continuity equation (using LVOT flow): 2.19 cm^2. - Left atrium: The atrium was mildly dilated. - Right ventricle: Systolic function was mildly to moderately   reduced. - Right atrium: The atrium was mildly dilated. - Pulmonary arteries: PA peak pressure: 66 mm Hg (S). Impressions:  - Compared to report from 2016, LVEF is now depressed.    Consultations:  None  Discharge Exam: Vitals:   04/09/16 2141 04/10/16 0400  BP: (!) 135/45 (!) 113/41  Pulse: 68 (!) 59  Resp: 20 18  Temp: 98.7 F (37.1 C) 97.9 F (36.6 C)    General: Pleasant alert 76 year old woman in no acute distress. Cardiovascular: S1, S2, with a soft systolic murmur. Respiratory: Decreased breath sounds at the bases and clear anteriorly. Extremities/skin: Trace of bilateral lower extremity edema, nonpitting. Hypertrophic thickening and discoloration of her lower extremities bilaterally. Mild abrasion over the proximal left lower extremity without bleeding.   Discharge Instructions   Discharge Instructions    Diet - low sodium heart healthy  Complete by:  As directed    Increase activity slowly    Complete by:  As directed      Current Discharge Medication List    CONTINUE these  medications which have CHANGED   Details  warfarin (COUMADIN) 2.5 MG tablet Starting tomorrow, 04/11/16, Take 2.5 mg tablet daily or as adjusted per the SNF physician.      CONTINUE these medications which have NOT CHANGED   Details  amiodarone (PACERONE) 200 MG tablet Take 0.5 tablets (100 mg total) by mouth daily. Qty: 30 tablet, Refills: 6    colchicine 0.6 MG tablet Take 0.5 tablets (0.3 mg total) by mouth daily. Qty: 30 tablet, Refills: 0    ferrous sulfate 325 (65 FE) MG tablet Take 325 mg by mouth 2 (two) times daily with a meal.    levothyroxine (SYNTHROID, LEVOTHROID) 50 MCG tablet Take 50 mcg by mouth daily before breakfast.    metolazone (ZAROXOLYN) 2.5 MG tablet Take 1 tablet (2.5 mg total) by mouth as directed. TAKE 1 TABLET AS NEEDED ONLY IF YOUR WEIGHT INCREASES 2-3 POUNDS IN 24 HOURS.    omeprazole (PRILOSEC) 20 MG capsule Take 20 mg by mouth daily.     potassium chloride SA (K-DUR,KLOR-CON) 20 MEQ tablet Take 1 tablet (20 mEq total) by mouth daily. Qty: 60 tablet, Refills: 6    simvastatin (ZOCOR) 10 MG tablet TAKE ONE TABLET BY MOUTH EVERY EVENING. Qty: 30 tablet, Refills: 3    tolterodine (DETROL LA) 4 MG 24 hr capsule Take 4 mg by mouth daily.    torsemide (DEMADEX) 100 MG tablet TAKE ONE TABLET BY MOUTH EVERY MORNING Qty: 90 tablet, Refills: 3       Allergies  Allergen Reactions  . Indomethacin Other (See Comments)    dizziness  . Norvasc [Amlodipine Besylate] Cough   Contact information for after-discharge care    [facility] .   Specialty:  Skilled Nursing Facility Contact information: [address] [place] [phone]               The results of significant diagnostics from this hospitalization (including imaging, microbiology, ancillary and laboratory) are listed below for reference.    Significant Diagnostic Studies: Ct Head Wo Contrast  Result Date: 04/09/2016 CLINICAL  DATA:  Weakness and left lower extremity.  Now resolved. EXAM: CT HEAD WITHOUT CONTRAST TECHNIQUE: Contiguous axial images were obtained from the base of the skull through the vertex without intravenous contrast. COMPARISON:  Head CT 11/15/2013 FINDINGS: Brain: No evidence of acute infarction, hemorrhage, hydrocephalus, extra-axial collection or mass lesion/mass effect. Moderate cerebral atrophy and chronic small vessel ischemia. Remote lacunar infarcts in the basal ganglia bilaterally. Vascular: Atherosclerosis of skullbase vasculature without hyperdense vessel or abnormal calcification. Skull: Normal. Negative for fracture or focal lesion. Sinuses/Orbits: Paranasal sinuses and mastoid air cells are clear. The visualized orbits are unremarkable. Bilateral cataract resection. Other: None. IMPRESSION: No acute intracranial abnormality. Atrophy, remote and chronic small vessel ischemic change. Electronically Signed   By: Jeb Levering M.D.   On: 04/09/2016 03:07   Mr Brain Wo Contrast  Result Date: 04/09/2016 CLINICAL DATA:  76 year old female with altered mental status. Weakness and recent fall. Initial encounter. EXAM: MRI HEAD WITHOUT CONTRAST TECHNIQUE: Multiplanar, multiecho pulse sequences of the brain and surrounding structures were obtained without intravenous contrast. COMPARISON:  Head CT without contrast 0244 hours today and earlier. FINDINGS: Brain: No restricted diffusion to suggest acute infarction. No midline  shift, mass effect, evidence of mass lesion, ventriculomegaly, extra-axial collection or acute intracranial hemorrhage. Cervicomedullary junction and pituitary are within normal limits. Patchy periventricular white matter T2 and FLAIR hyperintensity. Moderate T2 heterogeneity throughout the bilateral basal ganglia and thalami, but at least in part related to perivascular spaces. Small chronic lacunar infarct in the left cerebellum (series 7, image 5). Minimal T2 heterogeneity in the left  pons. No cortical encephalomalacia identified. There are occasional chronic micro hemorrhages in the brain, including the left occipital lobe (series 10, image 7). Vascular: Major intracranial vascular flow voids are preserved. Persistent right trigeminal artery, better demonstrated on today MRA. Skull and upper cervical spine: Negative. Sinuses/Orbits: Postoperative changes to both globes, otherwise negative. Other: Visible internal auditory structures appear normal. Mild left mastoid effusion is stable since 2015. Negative nasopharynx. Negative scalp soft tissues. IMPRESSION: 1. No acute intracranial abnormality. 2. Moderate for age signal changes, most pronounced in the cerebral white matter and deep gray matter nuclei, most compatible with chronic small vessel disease. Electronically Signed   By: Genevie Ann M.D.   On: 04/09/2016 09:06   US Carotid Bilateral (at [facility] Only)  Result Date: 04/09/2016 CLINICAL DATA:  Left lower extremity weakness. EXAM: BILATERAL CAROTID DUPLEX ULTRASOUND TECHNIQUE: Pearline Cables scale imaging, color Doppler and duplex ultrasound were performed of bilateral carotid and vertebral arteries in the neck. COMPARISON:  CT 04/09/2016 . FINDINGS: Criteria: Quantification of carotid stenosis is based on velocity parameters that correlate the residual internal carotid diameter with NASCET-based stenosis levels, using the diameter of the distal internal carotid lumen as the denominator for stenosis measurement. The following velocity measurements were obtained: RIGHT ICA:  65/14 cm/sec CCA:  [id number] cm/sec SYSTOLIC ICA/CCA RATIO:  0.7 DIASTOLIC ICA/CCA RATIO:  1.4 ECA:  141 cm/sec LEFT ICA:  54/8 cm/sec CCA:  [id number] cm/sec SYSTOLIC ICA/CCA RATIO:  0.7 DIASTOLIC ICA/CCA RATIO:  0.7 ECA:  126 cm/sec RIGHT CAROTID ARTERY: Mild right carotid bifurcation /proximal ICA atherosclerotic vascular disease. No flow limiting stenosis. RIGHT VERTEBRAL ARTERY:  Patent with antegrade flow. LEFT CAROTID ARTERY:  Mild left distal common carotid/carotid bifurcation atherosclerotic-disease. No flow limiting stenosis . LEFT VERTEBRAL ARTERY:  Patent with antegrade flow. IMPRESSION: 1. Mild right carotid bifurcation/ proximal ICA atherosclerotic vascular disease. No flow limiting stenosis. Degree of stenosis less than 50%. 2. Mild left distal common carotid/ carotid bifurcation atherosclerotic vascular disease. No flow limiting stenosis. Degree of stenosis less than 50%. 3.  Vertebral arteries are patent antegrade flow. Electronically Signed   By: Marcello Moores  Register   On: 04/09/2016 08:57   Mr Jodene Nam Head/brain Wo Cm  Result Date: 04/09/2016 CLINICAL DATA:  76 year old female with altered mental status. Weakness and recent fall. Initial encounter. EXAM: MRA HEAD WITHOUT CONTRAST TECHNIQUE: Angiographic images of the Circle of Willis were obtained using MRA technique without intravenous contrast. COMPARISON:  Brain MRI from today reported separately. Carotid Doppler ultrasound from today. FINDINGS: Antegrade flow in the posterior circulation. Mildly dominant distal left vertebral artery. No distal vertebral stenosis. The right PICA origin is normal. Patent vertebrobasilar junction. Persistent right side trigeminal artery (normal anatomic variant). SCA and PCA origins are normal. Posterior communicating arteries are diminutive or absent. Bilateral PCA branches are within normal limits. Antegrade flow in both ICA siphons. Mild siphon irregularity greater on the left with no associated siphon stenosis. Normal ophthalmic artery origins. Normal carotid termini, MCA and ACA origins. Diminutive or absent anterior communicating artery. Visualized ACA branches are normal. MCA M1 segments, bifurcations, and visualized bilateral  MCA branches are within normal limits. IMPRESSION: 1.  Negative intracranial MRA. 2. Persistent right trigeminal artery, an unusual normal anatomic variation. Electronically Signed   By: Genevie Ann M.D.   On: 04/09/2016  09:04    Microbiology: No results found for this or any previous visit (from the past 240 hour(s)).   Labs: Basic Metabolic Panel:  Recent Labs Lab 04/09/16 0229 04/10/16 0714  NA 140 137  K 3.9 3.6  CL 98* 97*  CO2 32 31  GLUCOSE 113* 90  BUN 33* 34*  CREATININE 1.92* 1.77*  CALCIUM 9.2 8.7*   Liver Function Tests:  Recent Labs Lab 04/09/16 0229  AST 27  ALT 10*  ALKPHOS 127*  BILITOT 1.2  PROT 6.9  ALBUMIN 3.3*   No results for input(s): LIPASE, AMYLASE in the last 168 hours. No results for input(s): AMMONIA in the last 168 hours. CBC:  Recent Labs Lab 04/09/16 0229 04/10/16 0714  WBC 4.7 3.0*  NEUTROABS 3.8  --   HGB 11.4* 10.4*  HCT 35.1* 32.9*  MCV 98.0 98.5  PLT 112* 104*   Cardiac Enzymes: No results for input(s): CKTOTAL, CKMB, CKMBINDEX, TROPONINI in the last 168 hours. BNP: BNP (last 3 results) No results for input(s): BNP in the last 8760 hours.  ProBNP (last 3 results) No results for input(s): PROBNP in the last 8760 hours.  CBG: No results for input(s): GLUCAP in the last 168 hours.     Signed:  Pattiann Solanki MD.  [facility] 04/10/2016, 10:13 AM

## 2016-04-10 NOTE — Clinical Social Work Placement (Signed)
   CLINICAL SOCIAL WORK PLACEMENT  NOTE  Date:  04/10/2016  Patient Details  Name: [name] [name] MRN: [id number] Date of Birth: Jun 10, 1940  Clinical Social Work is seeking post-discharge placement for this patient at the [place] level of care (*CSW will initial, date and re-position this form in  chart as items are completed):  Yes   Patient/family provided with [place] Work Department's list of facilities offering this level of care within the geographic area requested by the patient (or if unable, by the patient's family).  Yes   Patient/family informed of their freedom to choose among providers that offer the needed level of care, that participate in Medicare, Medicaid or managed care program needed by the patient, have an available bed and are willing to accept the patient.  Yes   Patient/family informed of [facility]'s ownership interest in [facility] and [facility], as well as of the fact that they are under no obligation to receive care at these facilities.  PASRR submitted to EDS on       PASRR number received on       Existing PASRR number confirmed on 04/09/16     FL2 transmitted to all facilities in geographic area requested by pt/family on 04/09/16     FL2 transmitted to all facilities within larger geographic area on       Patient informed that his/her managed care company has contracts with or will negotiate with certain facilities, including the following:        Yes   Patient/family informed of bed offers received.  Patient chooses bed at [facility]     Physician recommends and patient chooses bed at      Patient to be transferred to [facility] on  .  Patient to be transferred to facility by     ambulance  Patient family notified on   of transfer.04/10/16  Name of family member notified:      [facility]  PHYSICIAN       Additional Comment:     _______________________________________________ Roanna Raider, LCSW 04/10/2016, 12:54 PM

## 2016-04-10 NOTE — Progress Notes (Signed)
Pt has been accepted to [facility] by Lattie Haw.  D/c information sent by CSW via [place].  Pt going to Room 144 and Renee RN will be receiving her.  Pt's family at bedside aware of d/c plan.  Creta Levin, LCSW Weekend Coverage [phone]

## 2016-04-10 NOTE — Progress Notes (Signed)
[facility] for coumadin Indication: atrial fibrillation  Allergies  Allergen Reactions  . Indomethacin Other (See Comments)    dizziness  . Norvasc [Amlodipine Besylate] Cough    Patient Measurements: Height: 5\' 2"  (157.5 cm) Weight: 162 lb (73.5 kg) IBW/kg (Calculated) : 50.1   Vital Signs: Temp: 97.9 F (36.6 C) (02/03 0400) Temp Source: Oral (02/03 0400) BP: 113/41 (02/03 0400) Pulse Rate: 59 (02/03 0400)  Labs:  Recent Labs  04/09/16 0229 04/10/16 0714  HGB 11.4* 10.4*  HCT 35.1* 32.9*  PLT 112* 104*  APTT 39*  --   LABPROT 29.8* 35.6*  INR 2.77 3.45  CREATININE 1.92* 1.77*    Estimated Creatinine Clearance: 25.8 mL/min (by C-G formula based on SCr of 1.77 mg/dL (H)).   Medical History: Past Medical History:  Diagnosis Date  . Aortic insufficiency due to bicuspid aortic valve    Moderate by TEE  . Arthritis   . Chronic diastolic congestive heart failure ([place])    a) ECHO (09/2013) EF 50-55%  . CKD (chronic kidney disease) stage 4, GFR 15-29 ml/min (HCC)   . COPD (chronic obstructive pulmonary disease) ([place])   . Essential hypertension, benign   . History of stroke    2000 [place]  . History of TIA (transient ischemic attack)    Diagnosed 2003  . Hyperlipidemia   . Juvenile rheumatic fever    Age 24  . 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 NICM (nonischemic cardiomyopathy) ([place])    a) LHC (04/2013) no significant CAD  . Obesity   . Paroxysmal atrial fibrillation (HCC)    s/p MAZE (05/2013)  . Pulmonary hypertension 04/10/2016   Peak pressure 66 mmHg, Echo 04/09/2016  . S/P Maze operation for atrial fibrillation 05/16/2013   Complete bilateral atrial lesion set using cryothermy and bipolar radiofrequency ablation with oversewing of LA appendage  . S/P minimally invasive mitral valve replacement with bioprosthetic valve and maze procedure 05/16/2013   27 mm Edwards magna mitral bovine  bioprosthetic tissue valve placed via right thoracotomy  . Systolic dysfunction without heart failure 04/10/2016   EF 35-40%, ECHO 04/09/2016    Medications:  Prescriptions Prior to Admission  Medication Sig Dispense Refill Last Dose  . amiodarone (PACERONE) 200 MG tablet Take 0.5 tablets (100 mg total) by mouth daily. 30 tablet 6 04/08/2016 at Unknown time  . colchicine 0.6 MG tablet Take 0.5 tablets (0.3 mg total) by mouth daily. 30 tablet 0 04/08/2016 at Unknown time  . ferrous sulfate 325 (65 FE) MG tablet Take 325 mg by mouth 2 (two) times daily with a meal.   04/08/2016 at Unknown time  . levothyroxine (SYNTHROID, LEVOTHROID) 50 MCG tablet Take 50 mcg by mouth daily before breakfast.   04/08/2016 at Unknown time  . metolazone (ZAROXOLYN) 2.5 MG tablet Take 1 tablet (2.5 mg total) by mouth as directed. TAKE 1 TABLET AS NEEDED ONLY IF YOUR WEIGHT INCREASES 2-3 POUNDS IN 24 HOURS. (Patient taking differently: Take 2.5 mg by mouth daily. Marland Kitchen)   unknown  . omeprazole (PRILOSEC) 20 MG capsule Take 20 mg by mouth daily.    04/08/2016 at Unknown time  . potassium chloride SA (K-DUR,KLOR-CON) 20 MEQ tablet Take 1 tablet (20 mEq total) by mouth daily. 60 tablet 6 04/08/2016 at Unknown time  . simvastatin (ZOCOR) 10 MG tablet TAKE ONE TABLET BY MOUTH EVERY EVENING. 30 tablet 3 04/08/2016 at Unknown time  . tolterodine (DETROL LA) 4  MG 24 hr capsule Take 4 mg by mouth daily.   04/08/2016 at Unknown time  . torsemide (DEMADEX) 100 MG tablet TAKE ONE TABLET BY MOUTH EVERY MORNING 90 tablet 3 04/08/2016 at Unknown time  . warfarin (COUMADIN) 2.5 MG tablet Take 2.5 mg by mouth daily. At 1700   04/08/2016 at 1700    Assessment: 76 yo lady admitted with potential TIA to continue coumadin.  INR is 3.45 today Goal of Therapy:  INR 2-3 Monitor platelets by anticoagulation protocol: Yes   Plan:  Hold coumadin today Daily PT/INR  Areeba Sulser [place] 04/10/2016,9:14 AM

## 2016-04-11 ENCOUNTER — Encounter (HOSPITAL_COMMUNITY)
Admission: RE | Admit: 2016-04-11 | Discharge: 2016-04-11 | Disposition: A | Discharge status: Home / Self Care | Payer: Medicare HMO | Source: Skilled Nursing Facility | Attending: Internal Medicine | Admitting: Internal Medicine

## 2016-04-11 DIAGNOSIS — Z7901 Long term (current) use of anticoagulants: Secondary | ICD-10-CM | POA: Insufficient documentation

## 2016-04-11 LAB — PROTIME-INR
INR: 3.71
Prothrombin Time: 37.7 s — ABNORMAL HIGH (ref 11.4–15.2)

## 2016-04-12 ENCOUNTER — Non-Acute Institutional Stay (SKILLED_NURSING_FACILITY): Payer: Commercial Managed Care - HMO | Admitting: Internal Medicine

## 2016-04-12 ENCOUNTER — Encounter: Payer: Self-pay | Admitting: Internal Medicine

## 2016-04-12 ENCOUNTER — Encounter (HOSPITAL_COMMUNITY)
Admission: RE | Admit: 2016-04-12 | Discharge: 2016-04-12 | Disposition: A | Discharge status: Home / Self Care | Payer: Medicare HMO | Source: Skilled Nursing Facility | Attending: Internal Medicine | Admitting: Internal Medicine

## 2016-04-12 DIAGNOSIS — Z9181 History of falling: Secondary | ICD-10-CM | POA: Insufficient documentation

## 2016-04-12 DIAGNOSIS — I480 Paroxysmal atrial fibrillation: Secondary | ICD-10-CM

## 2016-04-12 DIAGNOSIS — I5022 Chronic systolic (congestive) heart failure: Secondary | ICD-10-CM

## 2016-04-12 DIAGNOSIS — D631 Anemia in chronic kidney disease: Secondary | ICD-10-CM | POA: Insufficient documentation

## 2016-04-12 DIAGNOSIS — D696 Thrombocytopenia, unspecified: Secondary | ICD-10-CM

## 2016-04-12 DIAGNOSIS — N183 Chronic kidney disease, stage 3 unspecified: Secondary | ICD-10-CM

## 2016-04-12 DIAGNOSIS — E039 Hypothyroidism, unspecified: Secondary | ICD-10-CM | POA: Insufficient documentation

## 2016-04-12 DIAGNOSIS — R29898 Other symptoms and signs involving the musculoskeletal system: Secondary | ICD-10-CM

## 2016-04-12 DIAGNOSIS — Z7901 Long term (current) use of anticoagulants: Secondary | ICD-10-CM | POA: Insufficient documentation

## 2016-04-12 DIAGNOSIS — E038 Other specified hypothyroidism: Secondary | ICD-10-CM

## 2016-04-12 DIAGNOSIS — Z5189 Encounter for other specified aftercare: Secondary | ICD-10-CM | POA: Insufficient documentation

## 2016-04-12 LAB — PROTIME-INR
INR: 3.35
PROTHROMBIN TIME: 34.7 s — AB (ref 11.4–15.2)

## 2016-04-12 NOTE — Progress Notes (Signed)
Provider:  Veleta Miners Location:   [facility] Room Number: 144/P Place of Service:  SNF (31)  PCP: [name], MD Patient Care Team: [name], MD as PCP - General (Cardiology)  Extended Emergency Contact Information Primary Emergency Contact: Hodges,[name] Address: [phone] [facility] [address], [place] [address] [place] Phone: [phone] Mobile Phone: [phone] Relation: Daughter Secondary Emergency Contact: Alyson Ingles, [place] [address] [place] of [place] Phone: [phone] Mobile Phone: [phone] Relation: Grandaughter  Code Status: DNR Goals of Care: Advanced Directive information Advanced Directives 04/12/2016  Does Patient Have a Medical Advance Directive? Yes  Type of Advance Directive Out of facility DNR (pink MOST or yellow form)  Does patient want to make changes to medical advance directive? No - Patient declined  Copy of [place] in Chart? -  Would patient like information on creating a medical advance directive? -  Pre-existing out of facility DNR order (yellow form or pink MOST form) -      Chief Complaint  Patient presents with  . New Admit To SNF    HPI: Patient is a 76 y.o. female seen today for admission to to SNF for therapy. Patient has multiple medical problems including PAF on Chronic coumadin, S/P Maze procedure, S/P Bioprosthetic Mitral valve, h/o TIA, Chronic Renal disease, And Diastolic CHF. She came to the ED with Sudden inset LLE weakness. She had extensive evaluation with Negative MRI for any acute process just with Small vessel disease.Carotid US was negative for any stenosis. Her weakness was thought to be due to generalized Deconditioning. She was dis charged to SNF for therapy. Patient is doing well . She is walking with the walker with therapist. She is on Chronic Oxygen at home. She lives alone and her Granddaughter and Sister help her. She  thinks her localized weakness is now better and Says probably, she had a sprain, She wants to go home  Past Medical History:  Diagnosis Date  . Aortic insufficiency due to bicuspid aortic valve    Moderate by TEE  . Arthritis   . Chronic diastolic congestive heart failure ([place])    a) ECHO (09/2013) EF 50-55%  . Chronic respiratory failure with hypoxia (HCC) 04/10/2016   On 2 L nasal cannula oxygen chronically.  . CKD (chronic kidney disease) stage 4, GFR 15-29 ml/min (HCC)   . COPD (chronic obstructive pulmonary disease) ([place])   . Essential hypertension, benign   . History of stroke    2000 [place]  . History of TIA (transient ischemic attack)    Diagnosed 2003  . Hyperlipidemia   . Juvenile rheumatic fever    Age 87  . 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 NICM (nonischemic cardiomyopathy) ([place])    a) LHC (04/2013) no significant CAD  . Obesity   . Paroxysmal atrial fibrillation (HCC)    s/p MAZE (05/2013)  . Pulmonary hypertension 04/10/2016   Peak pressure 66 mmHg, Echo 04/09/2016  . S/P Maze operation for atrial fibrillation 05/16/2013   Complete bilateral atrial lesion set using cryothermy and bipolar radiofrequency ablation with oversewing of LA appendage  . 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 Systolic dysfunction without heart failure 04/10/2016   EF  35-40%, ECHO 04/09/2016   Past Surgical History:  Procedure Laterality Date  . ABDOMINAL HYSTERECTOMY     Cervical Cancer  . CARDIOVERSION N/A 06/06/2013   Procedure: CARDIOVERSION;  Surgeon: Larey Dresser, MD;  Location: [facility];  Service: Cardiovascular;  Laterality: N/A;  . INTRAOPERATIVE TRANSESOPHAGEAL ECHOCARDIOGRAM N/A 05/16/2013   Procedure: INTRAOPERATIVE TRANSESOPHAGEAL ECHOCARDIOGRAM;  Surgeon: Rexene Alberts, MD;  Location: [facility];   Service: Open Heart Surgery;  Laterality: N/A;  . LEFT AND RIGHT HEART CATHETERIZATION WITH CORONARY ANGIOGRAM N/A 05/04/2013   Procedure: LEFT AND RIGHT HEART CATHETERIZATION WITH CORONARY ANGIOGRAM;  Surgeon: Jettie Booze, MD;  Location: [facility] CATH LAB;  Service: Cardiovascular;  Laterality: N/A;  . MINIMALLY INVASIVE MAZE PROCEDURE N/A 05/16/2013   Procedure: MINIMALLY INVASIVE MAZE PROCEDURE;  Surgeon: Rexene Alberts, MD;  Location: [facility];  Service: Open Heart Surgery;  Laterality: N/A;  . MITRAL VALVE REPLACEMENT Right 05/16/2013   Procedure: MINIMALLY INVASIVE MITRAL VALVE (MV) REPLACEMENT;  Surgeon: Rexene Alberts, MD;  Location: [facility];  Service: Open Heart Surgery;  Laterality: Right;  . Parathyroid/Thyroid surgery     Tumor  . TEE WITHOUT CARDIOVERSION N/A 04/06/2013   Procedure: TRANSESOPHAGEAL ECHOCARDIOGRAM (TEE);  Surgeon: [name], MD;  [facility];  Service: Cardiology;  Laterality: N/A;  . TEE WITHOUT CARDIOVERSION N/A 06/06/2013   Procedure: TRANSESOPHAGEAL ECHOCARDIOGRAM (TEE);  Surgeon: Larey Dresser, MD;  Location: [facility];  Service: Cardiovascular;  Laterality: N/A;  . TOTAL KNEE ARTHROPLASTY Right     reports that she has never smoked. She has never used smokeless tobacco. She reports that she does not drink alcohol or use drugs. Social History   Social History  . Marital status: Widowed    Spouse name: N/A  . Number of children: N/A  . Years of education: N/A   Occupational History  . Not on file.   Social History Main Topics  . Smoking status: Never Smoker  . Smokeless tobacco: Never Used  . Alcohol use No  . Drug use: No  . Sexual activity: Not on file   Other Topics Concern  . 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 Heart failure Father     . Heart attack Brother     Health Maintenance  Topic Date Due  . Samul Dada  01/02/1960  . COLONOSCOPY  01/02/1991  . ZOSTAVAX  01/01/2001  . DEXA SCAN  01/01/2006  . PNA vac Low Risk Adult (1 of 2 - PCV13) 02/05/2017 (Originally 01/01/2006)  . INFLUENZA VACCINE  Completed    Allergies  Allergen Reactions  . Indomethacin Other (See Comments)    dizziness  . Norvasc [Amlodipine Besylate] Cough    Allergies as of 04/12/2016      Reactions   Indomethacin Other (See Comments)   dizziness   Norvasc [amlodipine Besylate] Cough      Medication List       Accurate as of 04/12/16 11:14 AM. Always use your most recent med list.          acetaminophen 325 MG tablet Commonly known as:  TYLENOL Take 650 mg by mouth every 4 (four) hours as needed.   amiodarone 200 MG tablet Commonly known as:  PACERONE  Take 0.5 tablets (100 mg total) by mouth daily.   colchicine 0.6 MG tablet Take 0.5 tablets (0.3 mg total) by mouth daily.   ferrous sulfate 325 (65 FE) MG tablet Take 325 mg by mouth 2 (two) times daily with a meal.   levothyroxine 50 MCG tablet Commonly known as:  SYNTHROID, LEVOTHROID Take 50 mcg by mouth daily before breakfast.   metolazone 2.5 MG tablet Commonly known as:  ZAROXOLYN Take 1 tablet (2.5 mg total) by mouth as directed. TAKE 1 TABLET AS NEEDED ONLY IF YOUR WEIGHT INCREASES 2-3 POUNDS IN 24 HOURS.   omeprazole 20 MG capsule Commonly known as:  PRILOSEC Take 20 mg by mouth daily.   potassium chloride SA 20 MEQ tablet Commonly known as:  K-DUR,KLOR-CON Take 1 tablet (20 mEq total) by mouth daily.   simvastatin 10 MG tablet Commonly known as:  ZOCOR TAKE ONE TABLET BY MOUTH EVERY EVENING.   tolterodine 4 MG 24 hr capsule Commonly known as:  DETROL LA Take 4 mg by mouth daily.   torsemide 100 MG tablet Commonly known as:  DEMADEX TAKE ONE TABLET BY MOUTH EVERY MORNING       Review of Systems  Constitutional: Positive for activity change  and fatigue. Negative for appetite change, chills, diaphoresis and fever.  HENT: Negative for congestion, postnasal drip and rhinorrhea.   Respiratory: Positive for cough. Negative for apnea, chest tightness, shortness of breath and wheezing.   Cardiovascular: Positive for leg swelling. Negative for chest pain and palpitations.  Gastrointestinal: Negative.   Genitourinary: Negative for dysuria, frequency and urgency.  Musculoskeletal: Negative.   Skin: Negative.   Neurological: Positive for weakness. Negative for tremors, syncope, light-headedness and headaches.  Psychiatric/Behavioral: Positive for sleep disturbance. Negative for confusion, decreased concentration, dysphoric mood and hallucinations. The patient is not nervous/anxious and is not hyperactive.     Vitals:   04/12/16 1021  BP: (!) 146/72  Pulse: 62  Resp: 20  Temp: 97.3 F (36.3 C)  TempSrc: Oral  SpO2: 94%   There is no height or weight on file to calculate BMI. Physical Exam  Constitutional: She is oriented to person, place, and time. She appears well-developed and well-nourished.  HENT:  Head: Normocephalic.  Mouth/Throat: Oropharynx is clear and moist.  Eyes: Pupils are equal, round, and reactive to light.  Neck: Neck supple.  Cardiovascular: Normal rate and regular rhythm.   Murmur heard. Pulmonary/Chest: Effort normal. No respiratory distress. She has rales.  Rales more on left lower base then Right.  Abdominal: Soft. Bowel sounds are normal. She exhibits no distension. There is no tenderness. There is no rebound.  Musculoskeletal: She exhibits edema.  Lymphadenopathy:    She has no cervical adenopathy.  Neurological: She is alert and oriented to person, place, and time.  Patient had Good Strength in all extremities. Around 4/5 in all Extremities.  Skin:  Patient has Chronic venous changes in Both LE  Psychiatric: She has a normal mood and affect. Her behavior is normal. Thought content normal.    Labs  reviewed: Basic Metabolic Panel:  Recent Labs  06/26/15 0636 04/09/16 0229 04/10/16 0714  NA 137 140 137  K 3.9 3.9 3.6  CL 101 98* 97*  CO2 27 32 31  GLUCOSE 93 113* 90  BUN 36* 33* 34*  CREATININE 1.66* 1.92* 1.77*  CALCIUM 8.9 9.2 8.7*   Liver Function Tests:  Recent Labs  06/24/15 1549 06/25/15 0541 04/09/16 0229  AST 43* 34 27  ALT 29  25 10*  ALKPHOS 107 88 127*  BILITOT 1.3* 1.6* 1.2  PROT 7.5 6.7 6.9  ALBUMIN 3.5 2.9* 3.3*   No results for input(s): LIPASE, AMYLASE in the last 8760 hours. No results for input(s): AMMONIA in the last 8760 hours. CBC:  Recent Labs  06/12/15 1128 06/24/15 1549  06/27/15 0548 04/09/16 0229 04/10/16 0714  WBC 3.6* 8.8  < > 5.2 4.7 3.0*  NEUTROABS 2.6 7.1  --   --  3.8  --   HGB 12.1 12.4  < > 10.4* 11.4* 10.4*  HCT 37.6 39.1  < > 31.4* 35.1* 32.9*  MCV 97.9 97.0  < > 96.3 98.0 98.5  PLT 139* 111*  < > 83* 112* 104*  < > = values in this interval not displayed. Cardiac Enzymes: No results for input(s): CKTOTAL, CKMB, CKMBINDEX, TROPONINI in the last 8760 hours. BNP: Invalid input(s): POCBNP Lab Results  Component Value Date   HGBA1C 5.6 04/09/2016   Lab Results  Component Value Date   TSH 4.713 (H) 04/09/2016   Lab Results  Component Value Date   VITAMINB12 219 04/10/2016   No results found for: FOLATE Lab Results  Component Value Date   IRON 31 (L) 05/09/2014   TIBC 287 05/09/2014   FERRITIN 29 05/09/2014    Imaging and Procedures obtained prior to SNF admission: No results found.  Assessment/Plan  Transient weakness of left lower extremity At this time not sure of etiology but it can be TIA. Thought patient INR was therapeutic. Will continue Therapy. Patient wants to go home with help of her sister.   Chronic systolic heart failure [facility]) Patient had repeat Echo done this admission and she did have some worsening of her Ef. She is on torsemide and Metolazone PRN. Her weight is 168 lbs which is more  then her baseline weight of 162 lbs. Will check C ray. Do not see one from hospital. Continue to monitor Weigh and POX . Will probably need some extra diuresis.  Paroxysmal atrial fibrillation (HCC) Patient on Amiodarone for rate control and also on Coumadin Her Last INR was elevated holding Coumadin. Will repeat INR.   hypothyroidism TSH was high in hospital but Free T4 is actually elevated. Will recheck labs in week.  CKD (chronic kidney disease) stage 3, GFR 30-59 ml/min Creat is 1.7 Close to her baseline.  Thrombocytopenia ([place]) ? Coumadin related. Stable  Hypertension Slightly elevated today. Continue to monitor.  Hyperlipidemia Continue simvastatin   Family/ staff Communication:   Labs/tests ordered:CBC, CMP, Daily Weights, C Xray to Rule out Pulmonary edema. Total time spent in this patient care encounter was 45_ minutes; greater than 50% of the visit spent counseling patient and coordinating care for problems addressed at this encounter.

## 2016-04-13 ENCOUNTER — Non-Acute Institutional Stay (SKILLED_NURSING_FACILITY): Payer: Commercial Managed Care - HMO | Admitting: Internal Medicine

## 2016-04-13 ENCOUNTER — Encounter: Payer: Self-pay | Admitting: Internal Medicine

## 2016-04-13 DIAGNOSIS — I10 Essential (primary) hypertension: Secondary | ICD-10-CM

## 2016-04-13 DIAGNOSIS — I5022 Chronic systolic (congestive) heart failure: Secondary | ICD-10-CM

## 2016-04-13 DIAGNOSIS — J189 Pneumonia, unspecified organism: Secondary | ICD-10-CM

## 2016-04-13 DIAGNOSIS — J181 Lobar pneumonia, unspecified organism: Secondary | ICD-10-CM

## 2016-04-13 NOTE — Progress Notes (Signed)
Location:   [facility] Room Number: 144/P Place of Service:  SNF (31) Provider:  Aldean Baker, MD  Patient Care Team: [name], MD as PCP - General (Cardiology)  Extended Emergency Contact Information Primary Emergency Contact: Hodges,[name] Address: [phone] [facility] [address] [place] [address] [place] Phone: [phone] Mobile Phone: [phone] Relation: Daughter Secondary Emergency Contact: Alyson Ingles, [place] [address] [place] Phone: [phone] Mobile Phone: [phone] Relation: Grandaughter  Code Status:  DNR Goals of care: Advanced Directive information Advanced Directives 04/13/2016  Does Patient Have a Medical Advance Directive? Yes  Type of Advance Directive Out of facility DNR (pink MOST or yellow form)  Does patient want to make changes to medical advance directive? No - Patient declined  Copy of [place] in Chart? -  Would patient like information on creating a medical advance directive? -  Pre-existing out of facility DNR order (yellow form or pink MOST form) -     Chief Complaint  Patient presents with  . Acute Visit    HPI:  Pt is a 76 y.o. female seen today for an acute visit for C Xray shows Right Lower lobe Infiltrate and pulmonary Congestion.  Patient has multiple medical problems including PAF on Chronic coumadin, S/P Maze procedure, S/P Bioprosthetic Mitral valve, h/o TIA, Chronic Renal disease, And Diastolic CHF. She came to the ED with Sudden inset LLE weakness. She had extensive evaluation with Negative MRI for any acute process just with Small vessel disease.Carotid US was negative for any stenosis. Her weakness was thought to be due to generalized Deconditioning. She was dis charged to SNF for Therapy.  Patient continued to have cough and SOB when I saw her yesterday. Her weight was also up to 166 lbs. And Exam was positive for  rales. So Chest Xray was ordered which showed Pulmonary congestion and Right Lower lobe pneumonia.  She continues to be afebrile. Has cough but no sputum. Just feels congested in the chest. Continues to be SOB and PND.  Past Medical History:  Diagnosis Date  . Aortic insufficiency due to bicuspid aortic valve    Moderate by TEE  . Arthritis   . Chronic diastolic congestive heart failure ([place])    a) ECHO (09/2013) EF 50-55%  . Chronic respiratory failure with hypoxia (HCC) 04/10/2016   On 2 L nasal cannula oxygen chronically.  . CKD (chronic kidney disease) stage 4, GFR 15-29 ml/min (HCC)   . COPD (chronic obstructive pulmonary disease) ([place])   . Essential hypertension, benign   . History of stroke    2000 [place]  . History of TIA (transient ischemic attack)    Diagnosed 2003  . Hyperlipidemia   . Juvenile rheumatic fever    Age 76  . 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 NICM (nonischemic cardiomyopathy) ([place])    a) LHC (04/2013) no significant CAD  . Obesity   . Paroxysmal atrial fibrillation (HCC)    s/p MAZE (05/2013)  . Pulmonary hypertension 04/10/2016   Peak pressure 66 mmHg, Echo 04/09/2016  . S/P Maze operation for atrial fibrillation 05/16/2013   Complete bilateral atrial lesion set using cryothermy and bipolar radiofrequency ablation with oversewing of LA appendage  . S/P minimally invasive mitral valve replacement with bioprosthetic valve and maze procedure 05/16/2013   27 mm  Edwards magna mitral bovine bioprosthetic tissue valve placed via right thoracotomy  . Systolic dysfunction without heart failure 04/10/2016   EF 35-40%, ECHO 04/09/2016   Past Surgical History:  Procedure Laterality Date  . ABDOMINAL HYSTERECTOMY     Cervical Cancer  . CARDIOVERSION N/A 06/06/2013   Procedure: CARDIOVERSION;  Surgeon: Larey Dresser, MD;  Location: [facility];  Service: Cardiovascular;  Laterality: N/A;  . INTRAOPERATIVE TRANSESOPHAGEAL ECHOCARDIOGRAM N/A  05/16/2013   Procedure: INTRAOPERATIVE TRANSESOPHAGEAL ECHOCARDIOGRAM;  Surgeon: Rexene Alberts, MD;  Location: [facility];  Service: Open Heart Surgery;  Laterality: N/A;  . LEFT AND RIGHT HEART CATHETERIZATION WITH CORONARY ANGIOGRAM N/A 05/04/2013   Procedure: LEFT AND RIGHT HEART CATHETERIZATION WITH CORONARY ANGIOGRAM;  Surgeon: Jettie Booze, MD;  Location: [facility] CATH LAB;  Service: Cardiovascular;  Laterality: N/A;  . MINIMALLY INVASIVE MAZE PROCEDURE N/A 05/16/2013   Procedure: MINIMALLY INVASIVE MAZE PROCEDURE;  Surgeon: Rexene Alberts, MD;  Location: [facility];  Service: Open Heart Surgery;  Laterality: N/A;  . MITRAL VALVE REPLACEMENT Right 05/16/2013   Procedure: MINIMALLY INVASIVE MITRAL VALVE (MV) REPLACEMENT;  Surgeon: Rexene Alberts, MD;  Location: [facility];  Service: Open Heart Surgery;  Laterality: Right;  . Parathyroid/Thyroid surgery     Tumor  . TEE WITHOUT CARDIOVERSION N/A 04/06/2013   Procedure: TRANSESOPHAGEAL ECHOCARDIOGRAM (TEE);  Surgeon: [name], MD;  [facility];  Service: Cardiology;  Laterality: N/A;  . TEE WITHOUT CARDIOVERSION N/A 06/06/2013   Procedure: TRANSESOPHAGEAL ECHOCARDIOGRAM (TEE);  Surgeon: Larey Dresser, MD;  Location: [place];  Service: Cardiovascular;  Laterality: N/A;  . TOTAL KNEE ARTHROPLASTY Right     Allergies  Allergen Reactions  . Indomethacin Other (See Comments)    dizziness  . Norvasc [Amlodipine Besylate] Cough    Allergies as of 04/13/2016      Reactions   Indomethacin Other (See Comments)   dizziness   Norvasc [amlodipine Besylate] Cough      Medication List       Accurate as of 04/13/16  1:59 PM. Always use your most recent med list.          acetaminophen 325 MG tablet Commonly known as:  TYLENOL Take 650 mg by mouth every 4 (four) hours as needed.   amiodarone 200 MG tablet Commonly known as:  PACERONE Take 0.5 tablets (100 mg total) by mouth daily.   colchicine 0.6 MG tablet Take 0.5 tablets  (0.3 mg total) by mouth daily.   ferrous sulfate 325 (65 FE) MG tablet Take 325 mg by mouth 2 (two) times daily with a meal.   levothyroxine 50 MCG tablet Commonly known as:  SYNTHROID, LEVOTHROID Take 50 mcg by mouth daily before breakfast.   Melatonin 5 MG Tabs Take 5 mg by mouth at bedtime as needed. Take for 14 days for insomnia until 04/27/16   omeprazole 20 MG capsule Commonly known as:  PRILOSEC Take 20 mg by mouth daily.   potassium chloride SA 20 MEQ tablet Commonly known as:  K-DUR,KLOR-CON Take 1 tablet (20 mEq total) by mouth daily.   simvastatin 10 MG tablet Commonly known as:  ZOCOR TAKE ONE TABLET BY MOUTH EVERY EVENING.   tolterodine 4 MG 24 hr capsule Commonly known as:  DETROL LA Take 4 mg by mouth daily.   torsemide 100 MG tablet Commonly known as:  DEMADEX TAKE ONE TABLET BY MOUTH EVERY MORNING       Review of Systems  Constitutional: Positive for activity change.  Negative for appetite change, chills, diaphoresis, fatigue and fever.  HENT: Negative for postnasal drip, rhinorrhea and sore throat.   Respiratory: Positive for cough, chest tightness and shortness of breath. Negative for apnea.   Cardiovascular: Positive for leg swelling.  Gastrointestinal: Negative.   Musculoskeletal: Negative.   Neurological: Negative.   Psychiatric/Behavioral: Negative.     Immunization History  Administered Date(s) Administered  . Influenza,inj,Quad PF,36+ Mos 11/04/2014  . Influenza-Unspecified 12/08/2015   Pertinent  Health Maintenance Due  Topic Date Due  . COLONOSCOPY  01/02/1991  . DEXA SCAN  01/01/2006  . PNA vac Low Risk Adult (1 of 2 - PCV13) 02/05/2017 (Originally 01/01/2006)  . INFLUENZA VACCINE  Completed   Fall Risk  09/12/2015 07/02/2015 03/19/2015 12/12/2014 12/12/2014  Falls in the past year? No No No No -  Number falls in past yr: - - - - -  Injury with Fall? - - - - -  Risk for fall due to : Medication side effect Medication side effect  Impaired balance/gait Impaired balance/gait Impaired balance/gait   Functional Status Survey:    Vitals:   04/13/16 1358  BP: (!) 129/48  Pulse: 61  Resp: 20  Temp: 97.1 F (36.2 C)  TempSrc: Oral  SpO2: 95%   There is no height or weight on file to calculate BMI. Physical Exam  Constitutional: She is oriented to person, place, and time. She appears well-developed and well-nourished.  HENT:  Head: Normocephalic.  Mouth/Throat: Oropharynx is clear and moist.  Eyes: Pupils are equal, round, and reactive to light.  Neck: Neck supple.  Cardiovascular: Normal rate and regular rhythm.   Murmur heard. Pulmonary/Chest: Effort normal.  Bilateral Rales.  Abdominal: Soft. Bowel sounds are normal. She exhibits no distension. There is no tenderness. There is no rebound and no guarding.  Musculoskeletal:  Bilateral Edema with Chronic Venous changes.  Neurological: She is alert and oriented to person, place, and time.  Skin: Skin is warm and dry.  Psychiatric: She has a normal mood and affect. Her behavior is normal. Judgment and thought content normal.    Labs reviewed:  Recent Labs  06/26/15 0636 04/09/16 0229 04/10/16 0714  NA 137 140 137  K 3.9 3.9 3.6  CL 101 98* 97*  CO2 27 32 31  GLUCOSE 93 113* 90  BUN 36* 33* 34*  CREATININE 1.66* 1.92* 1.77*  CALCIUM 8.9 9.2 8.7*    Recent Labs  06/24/15 1549 06/25/15 0541 04/09/16 0229  AST 43* 34 27  ALT 29 25 10*  ALKPHOS 107 88 127*  BILITOT 1.3* 1.6* 1.2  PROT 7.5 6.7 6.9  ALBUMIN 3.5 2.9* 3.3*    Recent Labs  06/12/15 1128 06/24/15 1549  06/27/15 0548 04/09/16 0229 04/10/16 0714  WBC 3.6* 8.8  < > 5.2 4.7 3.0*  NEUTROABS 2.6 7.1  --   --  3.8  --   HGB 12.1 12.4  < > 10.4* 11.4* 10.4*  HCT 37.6 39.1  < > 31.4* 35.1* 32.9*  MCV 97.9 97.0  < > 96.3 98.0 98.5  PLT 139* 111*  < > 83* 112* 104*  < > = values in this interval not displayed. Lab Results  Component Value Date   TSH 4.713 (H) 04/09/2016   Lab  Results  Component Value Date   HGBA1C 5.6 04/09/2016   Lab Results  Component Value Date   CHOL 95 04/09/2016   HDL 50 04/09/2016   LDLCALC 28 04/09/2016   TRIG 83 04/09/2016  CHOLHDL 1.9 04/09/2016    Significant Diagnostic Results in last 30 days:  Ct Head Wo Contrast  Result Date: 04/09/2016 CLINICAL DATA:  Weakness and left lower extremity.  Now resolved. EXAM: CT HEAD WITHOUT CONTRAST TECHNIQUE: Contiguous axial images were obtained from the base of the skull through the vertex without intravenous contrast. COMPARISON:  Head CT 11/15/2013 FINDINGS: Brain: No evidence of acute infarction, hemorrhage, hydrocephalus, extra-axial collection or mass lesion/mass effect. Moderate cerebral atrophy and chronic small vessel ischemia. Remote lacunar infarcts in the basal ganglia bilaterally. Vascular: Atherosclerosis of skullbase vasculature without hyperdense vessel or abnormal calcification. Skull: Normal. Negative for fracture or focal lesion. Sinuses/Orbits: Paranasal sinuses and mastoid air cells are clear. The visualized orbits are unremarkable. Bilateral cataract resection. Other: None. IMPRESSION: No acute intracranial abnormality. Atrophy, remote and chronic small vessel ischemic change. Electronically Signed   By: Jeb Levering M.D.   On: 04/09/2016 03:07   Mr Brain Wo Contrast  Result Date: 04/09/2016 CLINICAL DATA:  76 year old female with altered mental status. Weakness and recent fall. Initial encounter. EXAM: MRI HEAD WITHOUT CONTRAST TECHNIQUE: Multiplanar, multiecho pulse sequences of the brain and surrounding structures were obtained without intravenous contrast. COMPARISON:  Head CT without contrast 0244 hours today and earlier. FINDINGS: Brain: No restricted diffusion to suggest acute infarction. No midline shift, mass effect, evidence of mass lesion, ventriculomegaly, extra-axial collection or acute intracranial hemorrhage. Cervicomedullary junction and pituitary are within  normal limits. Patchy periventricular white matter T2 and FLAIR hyperintensity. Moderate T2 heterogeneity throughout the bilateral basal ganglia and thalami, but at least in part related to perivascular spaces. Small chronic lacunar infarct in the left cerebellum (series 7, image 5). Minimal T2 heterogeneity in the left pons. No cortical encephalomalacia identified. There are occasional chronic micro hemorrhages in the brain, including the left occipital lobe (series 10, image 7). Vascular: Major intracranial vascular flow voids are preserved. Persistent right trigeminal artery, better demonstrated on today MRA. Skull and upper cervical spine: Negative. Sinuses/Orbits: Postoperative changes to both globes, otherwise negative. Other: Visible internal auditory structures appear normal. Mild left mastoid effusion is stable since 2015. Negative nasopharynx. Negative scalp soft tissues. IMPRESSION: 1. No acute intracranial abnormality. 2. Moderate for age signal changes, most pronounced in the cerebral white matter and deep gray matter nuclei, most compatible with chronic small vessel disease. Electronically Signed   By: Genevie Ann M.D.   On: 04/09/2016 09:06   US Carotid Bilateral (at [facility] And [facility] Only)  Result Date: 04/09/2016 CLINICAL DATA:  Left lower extremity weakness. EXAM: BILATERAL CAROTID DUPLEX ULTRASOUND TECHNIQUE: Pearline Cables scale imaging, color Doppler and duplex ultrasound were performed of bilateral carotid and vertebral arteries in the neck. COMPARISON:  CT 04/09/2016 . FINDINGS: Criteria: Quantification of carotid stenosis is based on velocity parameters that correlate the residual internal carotid diameter with NASCET-based stenosis levels, using the diameter of the distal internal carotid lumen as the denominator for stenosis measurement. The following velocity measurements were obtained: RIGHT ICA:  65/14 cm/sec CCA:  [id number] cm/sec SYSTOLIC ICA/CCA RATIO:  0.7 DIASTOLIC ICA/CCA RATIO:  1.4 ECA:  141 cm/sec  LEFT ICA:  54/8 cm/sec CCA:  [id number] cm/sec SYSTOLIC ICA/CCA RATIO:  0.7 DIASTOLIC ICA/CCA RATIO:  0.7 ECA:  126 cm/sec RIGHT CAROTID ARTERY: Mild right carotid bifurcation /proximal ICA atherosclerotic vascular disease. No flow limiting stenosis. RIGHT VERTEBRAL ARTERY:  Patent with antegrade flow. LEFT CAROTID ARTERY: Mild left distal common carotid/carotid bifurcation atherosclerotic-disease. No flow limiting stenosis . LEFT VERTEBRAL ARTERY:  Patent with antegrade  flow. IMPRESSION: 1. Mild right carotid bifurcation/ proximal ICA atherosclerotic vascular disease. No flow limiting stenosis. Degree of stenosis less than 50%. 2. Mild left distal common carotid/ carotid bifurcation atherosclerotic vascular disease. No flow limiting stenosis. Degree of stenosis less than 50%. 3.  Vertebral arteries are patent antegrade flow. Electronically Signed   By: Marcello Moores  Register   On: 04/09/2016 08:57   Mr Jodene Nam Head/brain Wo Cm  Result Date: 04/09/2016 CLINICAL DATA:  76 year old female with altered mental status. Weakness and recent fall. Initial encounter. EXAM: MRA HEAD WITHOUT CONTRAST TECHNIQUE: Angiographic images of the Circle of Willis were obtained using MRA technique without intravenous contrast. COMPARISON:  Brain MRI from today reported separately. Carotid Doppler ultrasound from today. FINDINGS: Antegrade flow in the posterior circulation. Mildly dominant distal left vertebral artery. No distal vertebral stenosis. The right PICA origin is normal. Patent vertebrobasilar junction. Persistent right side trigeminal artery (normal anatomic variant). SCA and PCA origins are normal. Posterior communicating arteries are diminutive or absent. Bilateral PCA branches are within normal limits. Antegrade flow in both ICA siphons. Mild siphon irregularity greater on the left with no associated siphon stenosis. Normal ophthalmic artery origins. Normal carotid termini, MCA and ACA origins. Diminutive or absent anterior  communicating artery. Visualized ACA branches are normal. MCA M1 segments, bifurcations, and visualized bilateral MCA branches are within normal limits. IMPRESSION: 1.  Negative intracranial MRA. 2. Persistent right trigeminal artery, an unusual normal anatomic variation. Electronically Signed   By: Genevie Ann M.D.   On: 04/09/2016 09:04    Assessment/Plan  Community Acquired RLL pneumonia Does explain the weakness patient had at home. Will start her on Levaquin 500 mg Qd for 10 days. Also start on Probiotics  Pulmonary Edema Will start her on Metalazone. Continue same dose of Torsemide. Follow clinically and will also follow weights. Repeat Labs to follow Potassium and renal function.  Family/ staff Communication:   Labs/tests ordered:

## 2016-04-14 ENCOUNTER — Encounter (HOSPITAL_COMMUNITY)
Admission: RE | Admit: 2016-04-14 | Discharge: 2016-04-14 | Disposition: A | Discharge status: Home / Self Care | Payer: Medicare HMO | Source: Skilled Nursing Facility | Attending: Internal Medicine | Admitting: Internal Medicine

## 2016-04-14 DIAGNOSIS — E039 Hypothyroidism, unspecified: Secondary | ICD-10-CM | POA: Insufficient documentation

## 2016-04-14 DIAGNOSIS — Z7901 Long term (current) use of anticoagulants: Secondary | ICD-10-CM | POA: Insufficient documentation

## 2016-04-14 DIAGNOSIS — Z5189 Encounter for other specified aftercare: Secondary | ICD-10-CM | POA: Insufficient documentation

## 2016-04-14 DIAGNOSIS — I480 Paroxysmal atrial fibrillation: Secondary | ICD-10-CM | POA: Insufficient documentation

## 2016-04-14 DIAGNOSIS — Z9181 History of falling: Secondary | ICD-10-CM | POA: Insufficient documentation

## 2016-04-14 DIAGNOSIS — D631 Anemia in chronic kidney disease: Secondary | ICD-10-CM | POA: Insufficient documentation

## 2016-04-14 DIAGNOSIS — D696 Thrombocytopenia, unspecified: Secondary | ICD-10-CM | POA: Insufficient documentation

## 2016-04-14 LAB — PROTIME-INR
INR: 2.34
Prothrombin Time: 26.1 seconds — ABNORMAL HIGH (ref 11.4–15.2)

## 2016-04-15 ENCOUNTER — Encounter (HOSPITAL_COMMUNITY)
Admission: RE | Admit: 2016-04-15 | Discharge: 2016-04-15 | Disposition: A | Discharge status: Home / Self Care | Payer: Medicare HMO | Source: Skilled Nursing Facility | Attending: Internal Medicine | Admitting: Internal Medicine

## 2016-04-15 DIAGNOSIS — Z9181 History of falling: Secondary | ICD-10-CM | POA: Insufficient documentation

## 2016-04-15 DIAGNOSIS — E039 Hypothyroidism, unspecified: Secondary | ICD-10-CM | POA: Insufficient documentation

## 2016-04-15 DIAGNOSIS — D696 Thrombocytopenia, unspecified: Secondary | ICD-10-CM | POA: Insufficient documentation

## 2016-04-15 DIAGNOSIS — D631 Anemia in chronic kidney disease: Secondary | ICD-10-CM | POA: Insufficient documentation

## 2016-04-15 DIAGNOSIS — Z5189 Encounter for other specified aftercare: Secondary | ICD-10-CM | POA: Insufficient documentation

## 2016-04-15 DIAGNOSIS — Z7901 Long term (current) use of anticoagulants: Secondary | ICD-10-CM | POA: Insufficient documentation

## 2016-04-15 LAB — BASIC METABOLIC PANEL
ANION GAP: 9 (ref 5–15)
BUN: 35 mg/dL — ABNORMAL HIGH (ref 6–20)
CHLORIDE: 98 mmol/L — AB (ref 101–111)
CO2: 31 mmol/L (ref 22–32)
CREATININE: 1.62 mg/dL — AB (ref 0.44–1.00)
Calcium: 8.9 mg/dL (ref 8.9–10.3)
GFR calc non Af Amer: 30 mL/min — ABNORMAL LOW (ref >60)
GFR, EST AFRICAN AMERICAN: 35 mL/min — AB (ref >60)
Glucose, Bld: 96 mg/dL (ref 65–99)
Potassium: 4 mmol/L (ref 3.5–5.1)
SODIUM: 138 mmol/L (ref 135–145)

## 2016-04-16 ENCOUNTER — Encounter (HOSPITAL_COMMUNITY)
Admission: RE | Admit: 2016-04-16 | Discharge: 2016-04-16 | Disposition: A | Discharge status: Home / Self Care | Payer: Medicare HMO | Source: Skilled Nursing Facility | Attending: Internal Medicine | Admitting: Internal Medicine

## 2016-04-16 DIAGNOSIS — K219 Gastro-esophageal reflux disease without esophagitis: Secondary | ICD-10-CM | POA: Insufficient documentation

## 2016-04-16 DIAGNOSIS — N3281 Overactive bladder: Secondary | ICD-10-CM | POA: Insufficient documentation

## 2016-04-16 DIAGNOSIS — I480 Paroxysmal atrial fibrillation: Secondary | ICD-10-CM | POA: Diagnosis not present

## 2016-04-16 DIAGNOSIS — E784 Other hyperlipidemia: Secondary | ICD-10-CM | POA: Insufficient documentation

## 2016-04-16 DIAGNOSIS — M1990 Unspecified osteoarthritis, unspecified site: Secondary | ICD-10-CM | POA: Insufficient documentation

## 2016-04-16 DIAGNOSIS — Z5189 Encounter for other specified aftercare: Secondary | ICD-10-CM | POA: Insufficient documentation

## 2016-04-16 LAB — PROTIME-INR
INR: 2.17
Prothrombin Time: 24.5 seconds — ABNORMAL HIGH (ref 11.4–15.2)

## 2016-04-19 ENCOUNTER — Encounter (HOSPITAL_COMMUNITY)
Admission: RE | Admit: 2016-04-19 | Discharge: 2016-04-19 | Disposition: A | Discharge status: Home / Self Care | Payer: Medicare HMO | Source: Skilled Nursing Facility | Attending: Internal Medicine | Admitting: Internal Medicine

## 2016-04-19 DIAGNOSIS — D696 Thrombocytopenia, unspecified: Secondary | ICD-10-CM | POA: Insufficient documentation

## 2016-04-19 DIAGNOSIS — E039 Hypothyroidism, unspecified: Secondary | ICD-10-CM | POA: Insufficient documentation

## 2016-04-19 DIAGNOSIS — Z9181 History of falling: Secondary | ICD-10-CM | POA: Insufficient documentation

## 2016-04-19 DIAGNOSIS — Z7901 Long term (current) use of anticoagulants: Secondary | ICD-10-CM | POA: Insufficient documentation

## 2016-04-19 DIAGNOSIS — D631 Anemia in chronic kidney disease: Secondary | ICD-10-CM | POA: Insufficient documentation

## 2016-04-19 DIAGNOSIS — Z5189 Encounter for other specified aftercare: Secondary | ICD-10-CM | POA: Insufficient documentation

## 2016-04-19 DIAGNOSIS — I10 Essential (primary) hypertension: Secondary | ICD-10-CM | POA: Insufficient documentation

## 2016-04-19 LAB — COMPREHENSIVE METABOLIC PANEL
ALBUMIN: 2.6 g/dL — AB (ref 3.5–5.0)
ALT: 13 U/L — ABNORMAL LOW (ref 14–54)
ANION GAP: 9 (ref 5–15)
AST: 27 U/L (ref 15–41)
Alkaline Phosphatase: 104 U/L (ref 38–126)
BILIRUBIN TOTAL: 0.5 mg/dL (ref 0.3–1.2)
BUN: 45 mg/dL — ABNORMAL HIGH (ref 6–20)
CHLORIDE: 92 mmol/L — AB (ref 101–111)
CO2: 35 mmol/L — ABNORMAL HIGH (ref 22–32)
Calcium: 8.7 mg/dL — ABNORMAL LOW (ref 8.9–10.3)
Creatinine, Ser: 2.05 mg/dL — ABNORMAL HIGH (ref 0.44–1.00)
GFR calc Af Amer: 26 mL/min — ABNORMAL LOW (ref >60)
GFR, EST NON AFRICAN AMERICAN: 23 mL/min — AB (ref >60)
Glucose, Bld: 91 mg/dL (ref 65–99)
Potassium: 3.2 mmol/L — ABNORMAL LOW (ref 3.5–5.1)
Sodium: 136 mmol/L (ref 135–145)
TOTAL PROTEIN: 6.1 g/dL — AB (ref 6.5–8.1)

## 2016-04-19 LAB — CBC
HEMATOCRIT: 31.7 % — AB (ref 36.0–46.0)
Hemoglobin: 9.9 g/dL — ABNORMAL LOW (ref 12.0–15.0)
MCH: 31.3 pg (ref 26.0–34.0)
MCHC: 31.2 g/dL (ref 30.0–36.0)
MCV: 100.3 fL — AB (ref 78.0–100.0)
PLATELETS: 115 10*3/uL — AB (ref 150–400)
RBC: 3.16 MIL/uL — ABNORMAL LOW (ref 3.87–5.11)
RDW: 15.7 % — ABNORMAL HIGH (ref 11.5–15.5)
WBC: 2.1 10*3/uL — ABNORMAL LOW (ref 4.0–10.5)

## 2016-04-19 LAB — PROTIME-INR
INR: 2.58
PROTHROMBIN TIME: 28.2 s — AB (ref 11.4–15.2)

## 2016-04-20 ENCOUNTER — Encounter: Payer: Self-pay | Admitting: Internal Medicine

## 2016-04-20 ENCOUNTER — Non-Acute Institutional Stay (SKILLED_NURSING_FACILITY): Payer: Commercial Managed Care - HMO | Admitting: Internal Medicine

## 2016-04-20 DIAGNOSIS — I480 Paroxysmal atrial fibrillation: Secondary | ICD-10-CM | POA: Diagnosis not present

## 2016-04-20 DIAGNOSIS — J181 Lobar pneumonia, unspecified organism: Secondary | ICD-10-CM | POA: Diagnosis not present

## 2016-04-20 DIAGNOSIS — R5381 Other malaise: Secondary | ICD-10-CM | POA: Diagnosis not present

## 2016-04-20 DIAGNOSIS — N183 Chronic kidney disease, stage 3 unspecified: Secondary | ICD-10-CM

## 2016-04-20 DIAGNOSIS — I5022 Chronic systolic (congestive) heart failure: Secondary | ICD-10-CM | POA: Diagnosis not present

## 2016-04-20 DIAGNOSIS — E038 Other specified hypothyroidism: Secondary | ICD-10-CM

## 2016-04-20 DIAGNOSIS — J189 Pneumonia, unspecified organism: Secondary | ICD-10-CM

## 2016-04-20 DIAGNOSIS — D696 Thrombocytopenia, unspecified: Secondary | ICD-10-CM

## 2016-04-20 NOTE — Progress Notes (Addendum)
Location:  [place] of Service:  SNF [phone]) Provider: Veleta Miners MD  [name], MD  Patient Care Team: [name], MD as PCP - General (Cardiology)  Extended Emergency Contact Information Primary Emergency Contact: Hodges,[name] Address: [phone] [facility] [address] [place] [address] Johnnette Litter of [place] Phone: [phone] Mobile Phone: [phone] Relation: Daughter Secondary Emergency Contact: Alyson Ingles, [place] [address] Johnnette Litter of [place] Phone: [phone] Mobile Phone: [phone] Relation: Grandaughter  Code Status:   Goals of care: Advanced Directive information Advanced Directives 04/13/2016  Does Patient Have a Medical Advance Directive? Yes  Type of Advance Directive Out of facility DNR (pink MOST or yellow form)  Does patient want to make changes to medical advance directive? No - Patient declined  Copy of [place] in Chart? -  Would patient like information on creating a medical advance directive? -  Pre-existing out of facility DNR order (yellow form or pink MOST form) -     Chief Complaint  Patient presents with  . Acute Visit    Follow up Of CHF    HPI:  Pt is a 76 y.o. female seen today for an acute visit for Follow up on CHF and Pneumonia.  Patient has multiple medical problems including PAF on Chronic coumadin, S/P Maze procedure, S/P Bioprosthetic Mitral valve, h/o TIA, Chronic Renal disease, And Syatolic and Diastolic CHF With Last 2D echo showed EF of 35-40%  Patient was admitted to SNF for therapy after hospitalization for weakness. Here her Chest X ray showed Pulmonary congestion and Right lower lobe Pneumonia. She was started on Augmentin and Metolazone 2.5 mg for 5 days. She is feeling much better today. She says she still has some SOB but doing well with Therapy. Her weakness is better. And just occasional cough. No fever or Chills. She wants to go home with help of  her sister and her grand daughter.   Past Medical History:  Diagnosis Date  . Aortic insufficiency due to bicuspid aortic valve    Moderate by TEE  . Arthritis   . Chronic diastolic congestive heart failure ([place])    a) ECHO (09/2013) EF 50-55%  . Chronic respiratory failure with hypoxia (HCC) 04/10/2016   On 2 L nasal cannula oxygen chronically.  . CKD (chronic kidney disease) stage 4, GFR 15-29 ml/min (HCC)   . COPD (chronic obstructive pulmonary disease) ([place])   . Essential hypertension, benign   . History of stroke    2000 [place]  . History of TIA (transient ischemic attack)    Diagnosed 2003  . Hyperlipidemia   . Juvenile rheumatic fever    Age 71  . 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 NICM (nonischemic cardiomyopathy) ([place])    a) LHC (04/2013) no significant CAD  . Obesity   . Paroxysmal atrial fibrillation (HCC)    s/p MAZE (05/2013)  . Pulmonary hypertension 04/10/2016   Peak pressure 66 mmHg, Echo 04/09/2016  . S/P Maze operation for atrial fibrillation 05/16/2013   Complete bilateral atrial lesion set using cryothermy and bipolar radiofrequency ablation with oversewing of LA appendage  . 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 Systolic dysfunction without heart failure 04/10/2016   EF  35-40%, ECHO 04/09/2016   Past Surgical History:  Procedure Laterality Date  . ABDOMINAL HYSTERECTOMY     Cervical Cancer  . CARDIOVERSION N/A 06/06/2013   Procedure: CARDIOVERSION;  Surgeon: Larey Dresser, MD;  Location: [facility];  Service: Cardiovascular;  Laterality: N/A;  . INTRAOPERATIVE TRANSESOPHAGEAL ECHOCARDIOGRAM N/A 05/16/2013   Procedure: INTRAOPERATIVE TRANSESOPHAGEAL ECHOCARDIOGRAM;  Surgeon: Rexene Alberts, MD;  Location: [facility];  Service: Open Heart Surgery;  Laterality: N/A;  . LEFT AND RIGHT  HEART CATHETERIZATION WITH CORONARY ANGIOGRAM N/A 05/04/2013   Procedure: LEFT AND RIGHT HEART CATHETERIZATION WITH CORONARY ANGIOGRAM;  Surgeon: Jettie Booze, MD;  Location: [facility] CATH LAB;  Service: Cardiovascular;  Laterality: N/A;  . MINIMALLY INVASIVE MAZE PROCEDURE N/A 05/16/2013   Procedure: MINIMALLY INVASIVE MAZE PROCEDURE;  Surgeon: Rexene Alberts, MD;  Location: [place];  Service: Open Heart Surgery;  Laterality: N/A;  . MITRAL VALVE REPLACEMENT Right 05/16/2013   Procedure: MINIMALLY INVASIVE MITRAL VALVE (MV) REPLACEMENT;  Surgeon: Rexene Alberts, MD;  Location: [facility];  Service: Open Heart Surgery;  Laterality: Right;  . Parathyroid/Thyroid surgery     Tumor  . TEE WITHOUT CARDIOVERSION N/A 04/06/2013   Procedure: TRANSESOPHAGEAL ECHOCARDIOGRAM (TEE);  Surgeon: [name], MD;  [facility];  Service: Cardiology;  Laterality: N/A;  . TEE WITHOUT CARDIOVERSION N/A 06/06/2013   Procedure: TRANSESOPHAGEAL ECHOCARDIOGRAM (TEE);  Surgeon: Larey Dresser, MD;  Location: [facility];  Service: Cardiovascular;  Laterality: N/A;  . TOTAL KNEE ARTHROPLASTY Right     Allergies  Allergen Reactions  . Indomethacin Other (See Comments)    dizziness  . Norvasc [Amlodipine Besylate] Cough    Allergies as of 04/20/2016      Reactions   Indomethacin Other (See Comments)   dizziness   Norvasc [amlodipine Besylate] Cough      Medication List      Medication Sig  . acetaminophen (TYLENOL) 325 MG tablet Take 650 mg by mouth every 4 (four) hours as needed.  Marland Kitchen amiodarone (PACERONE) 200 MG tablet Take 0.5 tablets (100 mg total) by mouth daily.  Marland Kitchen amoxicillin-clavulanate (AUGMENTIN) 500-125 MG tablet Take 1 tablet by mouth 3 (three) times daily.  . colchicine 0.6 MG tablet Take 0.5 tablets (0.3 mg total) by mouth daily.  . ferrous sulfate 325 (65 FE) MG tablet Take 325 mg by mouth 2 (two) times daily with a meal.  . levothyroxine (SYNTHROID, LEVOTHROID) 50 MCG tablet Take  50 mcg by mouth daily before breakfast.  . Melatonin 5 MG TABS Take 5 mg by mouth at bedtime as needed. Take for 14 days for insomnia until 04/27/16  . omeprazole (PRILOSEC) 20 MG capsule Take 20 mg by mouth daily.   . potassium chloride SA (K-DUR,KLOR-CON) 20 MEQ tablet Take 1 tablet (20 mEq total) by mouth daily.  . simvastatin (ZOCOR) 10 MG tablet TAKE ONE TABLET BY MOUTH EVERY EVENING.  Marland Kitchen tolterodine (DETROL LA) 4 MG 24 hr capsule Take 4 mg by mouth daily.  Marland Kitchen torsemide (DEMADEX) 100 MG tablet TAKE ONE TABLET BY MOUTH EVERY MORNING              Review of Systems  Review of Systems  Constitutional: Negative for activity change, appetite change, chills, diaphoresis, fatigue and fever.  HENT: Negative for mouth sores, postnasal drip, rhinorrhea, sinus pain and sore throat.   Respiratory: Negative for apnea, cough, chest tightness, shortness of breath and wheezing.   Cardiovascular: Negative for chest pain, palpitations and leg swelling.  Gastrointestinal: Negative for abdominal distention, abdominal pain, constipation, diarrhea, nausea and vomiting.  Genitourinary: Negative for dysuria and frequency.  Musculoskeletal: Negative for arthralgias, joint swelling and myalgias.  Skin: Negative for rash.  Neurological: Negative for dizziness, syncope, weakness, light-headedness and numbness.  Psychiatric/Behavioral: Negative for behavioral problems, confusion and sleep disturbance.    Immunization History  Administered Date(s) Administered  . Influenza,inj,Quad PF,36+ Mos 11/04/2014  . Influenza-Unspecified 12/08/2015   Pertinent  Health Maintenance Due  Topic Date Due  . COLONOSCOPY  01/02/1991  . DEXA SCAN  01/01/2006  . PNA vac Low Risk Adult (1 of 2 - PCV13) 02/05/2017 (Originally 01/01/2006)  . INFLUENZA VACCINE  Completed   Fall Risk  09/12/2015 07/02/2015 03/19/2015 12/12/2014 12/12/2014  Falls in the past year? No No No No -  Number falls in past yr: - - - - -  Injury with Fall?  - - - - -  Risk for fall due to : Medication side effect Medication side effect Impaired balance/gait Impaired balance/gait Impaired balance/gait   Functional Status Survey:    Vitals:   04/20/16 1843  BP: (!) 143/64  Pulse: 77  Temp: 97.5 F (36.4 C)  SpO2: 100%  Weight: 168 lb (76.2 kg)   Body mass index is 30.73 kg/m. Physical Exam  Constitutional: She is oriented to person, place, and time. She appears well-developed and well-nourished.  HENT:  Head: Normocephalic.  Mouth/Throat: Oropharynx is clear and moist.  Eyes: Pupils are equal, round, and reactive to light.  Neck: Neck supple.  Cardiovascular: Normal rate and regular rhythm.   Murmur heard. Pulmonary/Chest: Effort normal.  Few Rales in Right lower Lung  Abdominal: Soft. Bowel sounds are normal. She exhibits no distension. There is no tenderness. There is no rebound.  Neurological: She is alert and oriented to person, place, and time.  Skin:  Patient has Bilateral Moderate Edema in LE with Chronic Venous Changes.    Labs reviewed:  Recent Labs  04/10/16 0714 04/15/16 0700 04/19/16 0700  NA 137 138 136  K 3.6 4.0 3.2*  CL 97* 98* 92*  CO2 31 31 35*  GLUCOSE 90 96 91  BUN 34* 35* 45*  CREATININE 1.77* 1.62* 2.05*  CALCIUM 8.7* 8.9 8.7*    Recent Labs  06/25/15 0541 04/09/16 0229 04/19/16 0700  AST 34 27 27  ALT 25 10* 13*  ALKPHOS 88 127* 104  BILITOT 1.6* 1.2 0.5  PROT 6.7 6.9 6.1*  ALBUMIN 2.9* 3.3* 2.6*    Recent Labs  06/12/15 1128 06/24/15 1549  04/09/16 0229 04/10/16 0714 04/19/16 0700  WBC 3.6* 8.8  < > 4.7 3.0* 2.1*  NEUTROABS 2.6 7.1  --  3.8  --   --   HGB 12.1 12.4  < > 11.4* 10.4* 9.9*  HCT 37.6 39.1  < > 35.1* 32.9* 31.7*  MCV 97.9 97.0  < > 98.0 98.5 100.3*  PLT 139* 111*  < > 112* 104* 115*  < > = values in this interval not displayed. Lab Results  Component Value Date   TSH 4.713 (H) 04/09/2016   Lab Results  Component Value Date   HGBA1C 5.6 04/09/2016    Lab Results  Component Value Date   CHOL 95 04/09/2016   HDL 50 04/09/2016   LDLCALC 28 04/09/2016   TRIG 83 04/09/2016   CHOLHDL 1.9 04/09/2016    Significant Diagnostic Results in last 30 days:  Ct Head Wo Contrast  Result Date: 04/09/2016 CLINICAL DATA:  Weakness and left  lower extremity.  Now resolved. EXAM: CT HEAD WITHOUT CONTRAST TECHNIQUE: Contiguous axial images were obtained from the base of the skull through the vertex without intravenous contrast. COMPARISON:  Head CT 11/15/2013 FINDINGS: Brain: No evidence of acute infarction, hemorrhage, hydrocephalus, extra-axial collection or mass lesion/mass effect. Moderate cerebral atrophy and chronic small vessel ischemia. Remote lacunar infarcts in the basal ganglia bilaterally. Vascular: Atherosclerosis of skullbase vasculature without hyperdense vessel or abnormal calcification. Skull: Normal. Negative for fracture or focal lesion. Sinuses/Orbits: Paranasal sinuses and mastoid air cells are clear. The visualized orbits are unremarkable. Bilateral cataract resection. Other: None. IMPRESSION: No acute intracranial abnormality. Atrophy, remote and chronic small vessel ischemic change. Electronically Signed   By: Jeb Levering M.D.   On: 04/09/2016 03:07   Mr Brain Wo Contrast  Result Date: 04/09/2016 CLINICAL DATA:  75 year old female with altered mental status. Weakness and recent fall. Initial encounter. EXAM: MRI HEAD WITHOUT CONTRAST TECHNIQUE: Multiplanar, multiecho pulse sequences of the brain and surrounding structures were obtained without intravenous contrast. COMPARISON:  Head CT without contrast 0244 hours today and earlier. FINDINGS: Brain: No restricted diffusion to suggest acute infarction. No midline shift, mass effect, evidence of mass lesion, ventriculomegaly, extra-axial collection or acute intracranial hemorrhage. Cervicomedullary junction and pituitary are within normal limits. Patchy periventricular white matter T2 and  FLAIR hyperintensity. Moderate T2 heterogeneity throughout the bilateral basal ganglia and thalami, but at least in part related to perivascular spaces. Small chronic lacunar infarct in the left cerebellum (series 7, image 5). Minimal T2 heterogeneity in the left pons. No cortical encephalomalacia identified. There are occasional chronic micro hemorrhages in the brain, including the left occipital lobe (series 10, image 7). Vascular: Major intracranial vascular flow voids are preserved. Persistent right trigeminal artery, better demonstrated on today MRA. Skull and upper cervical spine: Negative. Sinuses/Orbits: Postoperative changes to both globes, otherwise negative. Other: Visible internal auditory structures appear normal. Mild left mastoid effusion is stable since 2015. Negative nasopharynx. Negative scalp soft tissues. IMPRESSION: 1. No acute intracranial abnormality. 2. Moderate for age signal changes, most pronounced in the cerebral white matter and deep gray matter nuclei, most compatible with chronic small vessel disease. Electronically Signed   By: Genevie Ann M.D.   On: 04/09/2016 09:06   US Carotid Bilateral (at [facility] And [facility] Only)  Result Date: 04/09/2016 CLINICAL DATA:  Left lower extremity weakness. EXAM: BILATERAL CAROTID DUPLEX ULTRASOUND TECHNIQUE: Pearline Cables scale imaging, color Doppler and duplex ultrasound were performed of bilateral carotid and vertebral arteries in the neck. COMPARISON:  CT 04/09/2016 . FINDINGS: Criteria: Quantification of carotid stenosis is based on velocity parameters that correlate the residual internal carotid diameter with NASCET-based stenosis levels, using the diameter of the distal internal carotid lumen as the denominator for stenosis measurement. The following velocity measurements were obtained: RIGHT ICA:  65/14 cm/sec CCA:  123456 cm/sec SYSTOLIC ICA/CCA RATIO:  0.7 DIASTOLIC ICA/CCA RATIO:  1.4 ECA:  141 cm/sec LEFT ICA:  54/8 cm/sec CCA:  [id number] cm/sec SYSTOLIC ICA/CCA  RATIO:  0.7 DIASTOLIC ICA/CCA RATIO:  0.7 ECA:  126 cm/sec RIGHT CAROTID ARTERY: Mild right carotid bifurcation /proximal ICA atherosclerotic vascular disease. No flow limiting stenosis. RIGHT VERTEBRAL ARTERY:  Patent with antegrade flow. LEFT CAROTID ARTERY: Mild left distal common carotid/carotid bifurcation atherosclerotic-disease. No flow limiting stenosis . LEFT VERTEBRAL ARTERY:  Patent with antegrade flow. IMPRESSION: 1. Mild right carotid bifurcation/ proximal ICA atherosclerotic vascular disease. No flow limiting stenosis. Degree of stenosis less than 50%. 2. Mild left distal common carotid/  carotid bifurcation atherosclerotic vascular disease. No flow limiting stenosis. Degree of stenosis less than 50%. 3.  Vertebral arteries are patent antegrade flow. Electronically Signed   By: Marcello Moores  Register   On: 04/09/2016 08:57   Mr Jodene Nam Head/brain Wo Cm  Result Date: 04/09/2016 CLINICAL DATA:  76 year old female with altered mental status. Weakness and recent fall. Initial encounter. EXAM: MRA HEAD WITHOUT CONTRAST TECHNIQUE: Angiographic images of the Circle of Willis were obtained using MRA technique without intravenous contrast. COMPARISON:  Brain MRI from today reported separately. Carotid Doppler ultrasound from today. FINDINGS: Antegrade flow in the posterior circulation. Mildly dominant distal left vertebral artery. No distal vertebral stenosis. The right PICA origin is normal. Patent vertebrobasilar junction. Persistent right side trigeminal artery (normal anatomic variant). SCA and PCA origins are normal. Posterior communicating arteries are diminutive or absent. Bilateral PCA branches are within normal limits. Antegrade flow in both ICA siphons. Mild siphon irregularity greater on the left with no associated siphon stenosis. Normal ophthalmic artery origins. Normal carotid termini, MCA and ACA origins. Diminutive or absent anterior communicating artery. Visualized ACA branches are normal. MCA M1  segments, bifurcations, and visualized bilateral MCA branches are within normal limits. IMPRESSION: 1.  Negative intracranial MRA. 2. Persistent right trigeminal artery, an unusual normal anatomic variation. Electronically Signed   By: Genevie Ann M.D.   On: 04/09/2016 09:04    Assessment/Plan Chronic systolic heart failure ([place]) Patient is off Metolazone only on torsemide. Continue to follow. Her weight is still higher then her Dry weight of 162 lbs. But clinically she is doing better.  Community acquired pneumonia of right lower lobe of lung ([place]) Afebrile and stable on Augmentin.  Paroxysmal atrial fibrillation (HCC) Coumadin restarted. INR therapeutic. Rate controlled.  Chronic Kidney disease. Mild worsening of her Creat to 2. She has been upto 1.9 before. Most likely due to Diuresis. Her Metolazone was Stopped. Repeat labs in few days.  Hypokalemia Potassium supplemented. Follow up labs  Hypothyroidism TSH mildly elevated in hospital but free t4 was normal. Follow up as put patint.  Thrombocytopenia ([place]) Platelets Stable   Physical deconditioning Doing well with therapy. Discharge Home with family help. Anemia due to chronic Renal disease. On iron supplement. HGB is stable Mild Leucopenia Will follow CBC.      Family/ staff Communication:  Labs/tests ordered:  CBC with Diff, CMP

## 2016-04-20 NOTE — Progress Notes (Signed)
Location:      Place of Service:    Provider:    [name], MD  Patient Care Team: [name], MD as PCP - General (Cardiology)  Extended Emergency Contact Information Primary Emergency Contact: Hodges,[name] Address: [phone] [facility] [address], [place] [address] [place] Phone: [phone] Mobile Phone: [phone] Relation: Daughter Secondary Emergency Contact: Alyson Ingles, [place] [address] [place] Phone: [phone] Mobile Phone: [phone] Relation: Grandaughter  Code Status:  Goals of care: Advanced Directive information Advanced Directives 04/13/2016  Does Patient Have a Medical Advance Directive? Yes  Type of Advance Directive Out of facility DNR (pink MOST or yellow form)  Does patient want to make changes to medical advance directive? No - Patient declined  Copy of [place] in Chart? -  Would patient like information on creating a medical advance directive? -  Pre-existing out of facility DNR order (yellow form or pink MOST form) -     No chief complaint on file.   HPI:  Pt is a 76 y.o. female seen today for an acute visit for    Past Medical History:  Diagnosis Date  . Aortic insufficiency due to bicuspid aortic valve    Moderate by TEE  . Arthritis   . Chronic diastolic congestive heart failure ([place])    a) ECHO (09/2013) EF 50-55%  . Chronic respiratory failure with hypoxia (HCC) 04/10/2016   On 2 L nasal cannula oxygen chronically.  . CKD (chronic kidney disease) stage 4, GFR 15-29 ml/min (HCC)   . COPD (chronic obstructive pulmonary disease) ([place])   . Essential hypertension, benign   . History of stroke    2000 [place]  . History of TIA (transient ischemic attack)    Diagnosed 2003  . Hyperlipidemia   . Juvenile rheumatic fever    Age 62  . 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 NICM (nonischemic cardiomyopathy) ([place])    a) LHC (04/2013) no  significant CAD  . Obesity   . Paroxysmal atrial fibrillation (HCC)    s/p MAZE (05/2013)  . Pulmonary hypertension 04/10/2016   Peak pressure 66 mmHg, Echo 04/09/2016  . S/P Maze operation for atrial fibrillation 05/16/2013   Complete bilateral atrial lesion set using cryothermy and bipolar radiofrequency ablation with oversewing of LA appendage  . 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 Systolic dysfunction without heart failure 04/10/2016   EF 35-40%, ECHO 04/09/2016   Past Surgical History:  Procedure Laterality Date  . ABDOMINAL HYSTERECTOMY     Cervical Cancer  . CARDIOVERSION N/A 06/06/2013   Procedure: CARDIOVERSION;  Surgeon: Larey Dresser, MD;  Location: [place];  Service: Cardiovascular;  Laterality: N/A;  . INTRAOPERATIVE TRANSESOPHAGEAL ECHOCARDIOGRAM N/A 05/16/2013   Procedure: INTRAOPERATIVE TRANSESOPHAGEAL ECHOCARDIOGRAM;  Surgeon: Rexene Alberts, MD;  Location: [place];  Service: Open Heart Surgery;  Laterality: N/A;  . LEFT AND RIGHT HEART CATHETERIZATION WITH CORONARY ANGIOGRAM N/A 05/04/2013   Procedure: LEFT AND RIGHT HEART CATHETERIZATION WITH CORONARY ANGIOGRAM;  Surgeon: Jettie Booze, MD;  Location: [facility] CATH LAB;  Service: Cardiovascular;  Laterality: N/A;  . MINIMALLY INVASIVE MAZE PROCEDURE N/A 05/16/2013   Procedure: MINIMALLY INVASIVE MAZE PROCEDURE;  Surgeon: Rexene Alberts,  MD;  Location: [facility];  Service: Open Heart Surgery;  Laterality: N/A;  . MITRAL VALVE REPLACEMENT Right 05/16/2013   Procedure: MINIMALLY INVASIVE MITRAL VALVE (MV) REPLACEMENT;  Surgeon: Rexene Alberts, MD;  Location: [place];  Service: Open Heart Surgery;  Laterality: Right;  . Parathyroid/Thyroid surgery     Tumor  . TEE WITHOUT CARDIOVERSION N/A 04/06/2013   Procedure: TRANSESOPHAGEAL ECHOCARDIOGRAM (TEE);  Surgeon: [name], MD;  [facility];  Service: Cardiology;  Laterality: N/A;  . TEE WITHOUT CARDIOVERSION N/A 06/06/2013   Procedure: TRANSESOPHAGEAL ECHOCARDIOGRAM (TEE);  Surgeon: Larey Dresser, MD;  Location: [place];  Service: Cardiovascular;  Laterality: N/A;  . TOTAL KNEE ARTHROPLASTY Right     Allergies  Allergen Reactions  . Indomethacin Other (See Comments)    dizziness  . Norvasc [Amlodipine Besylate] Cough    Allergies as of 04/20/2016      Reactions   Indomethacin Other (See Comments)   dizziness   Norvasc [amlodipine Besylate] Cough      Medication List    Notice   This visit is during an admission. Changes to the med list made in this visit will be reflected in the After Visit Summary of the admission.     Review of Systems  Immunization History  Administered Date(s) Administered  . Influenza,inj,Quad PF,36+ Mos 11/04/2014  . Influenza-Unspecified 12/08/2015   Pertinent  Health Maintenance Due  Topic Date Due  . COLONOSCOPY  01/02/1991  . DEXA SCAN  01/01/2006  . PNA vac Low Risk Adult (1 of 2 - PCV13) 02/05/2017 (Originally 01/01/2006)  . INFLUENZA VACCINE  Completed   Fall Risk  09/12/2015 07/02/2015 03/19/2015 12/12/2014 12/12/2014  Falls in the past year? No No No No -  Number falls in past yr: - - - - -  Injury with Fall? - - - - -  Risk for fall due to : Medication side effect Medication side effect Impaired balance/gait Impaired balance/gait Impaired balance/gait   Functional Status Survey:    There were no vitals filed for this visit. There is no height or weight on file to calculate BMI. Physical Exam  Labs reviewed:  Recent Labs  04/10/16 0714 04/15/16 0700 04/19/16 0700  NA 137 138 136  K 3.6 4.0 3.2*  CL 97* 98* 92*  CO2 31 31 35*  GLUCOSE 90 96 91  BUN 34* 35* 45*  CREATININE 1.77* 1.62* 2.05*  CALCIUM 8.7* 8.9 8.7*    Recent Labs  06/25/15 0541 04/09/16 0229 04/19/16 0700  AST 34 27 27  ALT 25 10* 13*  ALKPHOS 88  127* 104  BILITOT 1.6* 1.2 0.5  PROT 6.7 6.9 6.1*  ALBUMIN 2.9* 3.3* 2.6*    Recent Labs  06/12/15 1128 06/24/15 1549  04/09/16 0229 04/10/16 0714 04/19/16 0700  WBC 3.6* 8.8  < > 4.7 3.0* 2.1*  NEUTROABS 2.6 7.1  --  3.8  --   --   HGB 12.1 12.4  < > 11.4* 10.4* 9.9*  HCT 37.6 39.1  < > 35.1* 32.9* 31.7*  MCV 97.9 97.0  < > 98.0 98.5 100.3*  PLT 139* 111*  < > 112* 104* 115*  < > = values in this interval not displayed. Lab Results  Component Value Date   TSH 4.713 (H) 04/09/2016   Lab Results  Component Value Date   HGBA1C 5.6 04/09/2016   Lab Results  Component Value Date   CHOL 95 04/09/2016   HDL 50 04/09/2016  [place] 28 04/09/2016   TRIG 83 04/09/2016   CHOLHDL 1.9 04/09/2016    Significant Diagnostic Results in last 30 days:  Ct Head Wo Contrast  Result Date: 04/09/2016 CLINICAL DATA:  Weakness and left lower extremity.  Now resolved. EXAM: CT HEAD WITHOUT CONTRAST TECHNIQUE: Contiguous axial images were obtained from the base of the skull through the vertex without intravenous contrast. COMPARISON:  Head CT 11/15/2013 FINDINGS: Brain: No evidence of acute infarction, hemorrhage, hydrocephalus, extra-axial collection or mass lesion/mass effect. Moderate cerebral atrophy and chronic small vessel ischemia. Remote lacunar infarcts in the basal ganglia bilaterally. Vascular: Atherosclerosis of skullbase vasculature without hyperdense vessel or abnormal calcification. Skull: Normal. Negative for fracture or focal lesion. Sinuses/Orbits: Paranasal sinuses and mastoid air cells are clear. The visualized orbits are unremarkable. Bilateral cataract resection. Other: None. IMPRESSION: No acute intracranial abnormality. Atrophy, remote and chronic small vessel ischemic change. Electronically Signed   By: Jeb Levering M.D.   On: 04/09/2016 03:07   Mr Brain Wo Contrast  Result Date: 04/09/2016 CLINICAL DATA:  76 year old female with altered mental status. Weakness and recent  fall. Initial encounter. EXAM: MRI HEAD WITHOUT CONTRAST TECHNIQUE: Multiplanar, multiecho pulse sequences of the brain and surrounding structures were obtained without intravenous contrast. COMPARISON:  Head CT without contrast 0244 hours today and earlier. FINDINGS: Brain: No restricted diffusion to suggest acute infarction. No midline shift, mass effect, evidence of mass lesion, ventriculomegaly, extra-axial collection or acute intracranial hemorrhage. Cervicomedullary junction and pituitary are within normal limits. Patchy periventricular white matter T2 and FLAIR hyperintensity. Moderate T2 heterogeneity throughout the bilateral basal ganglia and thalami, but at least in part related to perivascular spaces. Small chronic lacunar infarct in the left cerebellum (series 7, image 5). Minimal T2 heterogeneity in the left pons. No cortical encephalomalacia identified. There are occasional chronic micro hemorrhages in the brain, including the left occipital lobe (series 10, image 7). Vascular: Major intracranial vascular flow voids are preserved. Persistent right trigeminal artery, better demonstrated on today MRA. Skull and upper cervical spine: Negative. Sinuses/Orbits: Postoperative changes to both globes, otherwise negative. Other: Visible internal auditory structures appear normal. Mild left mastoid effusion is stable since 2015. Negative nasopharynx. Negative scalp soft tissues. IMPRESSION: 1. No acute intracranial abnormality. 2. Moderate for age signal changes, most pronounced in the cerebral white matter and deep gray matter nuclei, most compatible with chronic small vessel disease. Electronically Signed   By: Genevie Ann M.D.   On: 04/09/2016 09:06   US Carotid Bilateral (at [facility] Only)  Result Date: 04/09/2016 CLINICAL DATA:  Left lower extremity weakness. EXAM: BILATERAL CAROTID DUPLEX ULTRASOUND TECHNIQUE: Pearline Cables scale imaging, color Doppler and duplex ultrasound were performed of bilateral carotid and  vertebral arteries in the neck. COMPARISON:  CT 04/09/2016 . FINDINGS: Criteria: Quantification of carotid stenosis is based on velocity parameters that correlate the residual internal carotid diameter with NASCET-based stenosis levels, using the diameter of the distal internal carotid lumen as the denominator for stenosis measurement. The following velocity measurements were obtained: RIGHT ICA:  65/14 cm/sec CCA:  123456 cm/sec SYSTOLIC ICA/CCA RATIO:  0.7 DIASTOLIC ICA/CCA RATIO:  1.4 ECA:  141 cm/sec LEFT ICA:  54/8 cm/sec CCA:  [id number] cm/sec SYSTOLIC ICA/CCA RATIO:  0.7 DIASTOLIC ICA/CCA RATIO:  0.7 ECA:  126 cm/sec RIGHT CAROTID ARTERY: Mild right carotid bifurcation /proximal ICA atherosclerotic vascular disease. No flow limiting stenosis. RIGHT VERTEBRAL ARTERY:  Patent with antegrade flow. LEFT CAROTID ARTERY: Mild left distal common carotid/carotid bifurcation atherosclerotic-disease. No flow  limiting stenosis . LEFT VERTEBRAL ARTERY:  Patent with antegrade flow. IMPRESSION: 1. Mild right carotid bifurcation/ proximal ICA atherosclerotic vascular disease. No flow limiting stenosis. Degree of stenosis less than 50%. 2. Mild left distal common carotid/ carotid bifurcation atherosclerotic vascular disease. No flow limiting stenosis. Degree of stenosis less than 50%. 3.  Vertebral arteries are patent antegrade flow. Electronically Signed   By: Marcello Moores  Register   On: 04/09/2016 08:57   Mr Jodene Nam Head/brain Wo Cm  Result Date: 04/09/2016 CLINICAL DATA:  76 year old female with altered mental status. Weakness and recent fall. Initial encounter. EXAM: MRA HEAD WITHOUT CONTRAST TECHNIQUE: Angiographic images of the Circle of Willis were obtained using MRA technique without intravenous contrast. COMPARISON:  Brain MRI from today reported separately. Carotid Doppler ultrasound from today. FINDINGS: Antegrade flow in the posterior circulation. Mildly dominant distal left vertebral artery. No distal vertebral stenosis.  The right PICA origin is normal. Patent vertebrobasilar junction. Persistent right side trigeminal artery (normal anatomic variant). SCA and PCA origins are normal. Posterior communicating arteries are diminutive or absent. Bilateral PCA branches are within normal limits. Antegrade flow in both ICA siphons. Mild siphon irregularity greater on the left with no associated siphon stenosis. Normal ophthalmic artery origins. Normal carotid termini, MCA and ACA origins. Diminutive or absent anterior communicating artery. Visualized ACA branches are normal. MCA M1 segments, bifurcations, and visualized bilateral MCA branches are within normal limits. IMPRESSION: 1.  Negative intracranial MRA. 2. Persistent right trigeminal artery, an unusual normal anatomic variation. Electronically Signed   By: Genevie Ann M.D.   On: 04/09/2016 09:04    Assessment/Plan There are no diagnoses linked to this encounter.   Family/ staff Communication:   Labs/tests ordered:

## 2016-04-22 ENCOUNTER — Encounter (HOSPITAL_COMMUNITY)
Admission: RE | Admit: 2016-04-22 | Discharge: 2016-04-22 | Disposition: A | Discharge status: Home / Self Care | Payer: Medicare HMO | Source: Skilled Nursing Facility | Attending: *Deleted | Admitting: *Deleted

## 2016-04-22 LAB — BASIC METABOLIC PANEL
ANION GAP: 9 (ref 5–15)
BUN: 51 mg/dL — AB (ref 6–20)
CHLORIDE: 91 mmol/L — AB (ref 101–111)
CO2: 37 mmol/L — AB (ref 22–32)
Calcium: 8.9 mg/dL (ref 8.9–10.3)
Creatinine, Ser: 1.74 mg/dL — ABNORMAL HIGH (ref 0.44–1.00)
GFR calc Af Amer: 32 mL/min — ABNORMAL LOW (ref >60)
GFR, EST NON AFRICAN AMERICAN: 27 mL/min — AB (ref >60)
GLUCOSE: 101 mg/dL — AB (ref 65–99)
POTASSIUM: 3.7 mmol/L (ref 3.5–5.1)
Sodium: 137 mmol/L (ref 135–145)

## 2016-04-24 ENCOUNTER — Non-Acute Institutional Stay (SKILLED_NURSING_FACILITY): Payer: Commercial Managed Care - HMO | Admitting: Internal Medicine

## 2016-04-24 DIAGNOSIS — M79642 Pain in left hand: Secondary | ICD-10-CM

## 2016-04-24 DIAGNOSIS — I5022 Chronic systolic (congestive) heart failure: Secondary | ICD-10-CM

## 2016-04-24 DIAGNOSIS — N183 Chronic kidney disease, stage 3 unspecified: Secondary | ICD-10-CM

## 2016-04-24 DIAGNOSIS — D696 Thrombocytopenia, unspecified: Secondary | ICD-10-CM | POA: Diagnosis not present

## 2016-04-25 NOTE — Progress Notes (Signed)
This is an acute visit.  Level care skilled.  Facility is [facility].  Chief complaint-acute visit secondary to left hand arthritic pain.  History of present illness.  Patient is a pleasant 76 year old female who is here for rehabilitation after hospitalization for weakness.  In the hospital chest x-ray did show pulmonary congestion right lower lobe pneumonia she was too with antibiotics and diuretics.  She is not complaining of any increased shortness of breath or cough today.  She does have a history of arthritis as well and currently is only receiving Tylenol-she says she has most prominently arthritic pain in her left hand-she says this is chronic-she says the Tylenol is not really helping this and would like something a little stronger.  Past Medical History:  Diagnosis Date  . Aortic insufficiency due to bicuspid aortic valve    Moderate by TEE  . Arthritis   . Chronic diastolic congestive heart failure ([place])    a) ECHO (09/2013) EF 50-55%  . Chronic respiratory failure with hypoxia (HCC) 04/10/2016   On 2 L nasal cannula oxygen chronically.  . CKD (chronic kidney disease) stage 4, GFR 15-29 ml/min (HCC)   . COPD (chronic obstructive pulmonary disease) ([place])   . Essential hypertension, benign   . History of stroke    2000 [place]  . History of TIA (transient ischemic attack)    Diagnosed 2003  . Hyperlipidemia   . Juvenile rheumatic fever    Age 31  . 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 NICM (nonischemic cardiomyopathy) ([place])    a) LHC (04/2013) no significant CAD  . Obesity   . Paroxysmal atrial fibrillation (HCC)    s/p MAZE (05/2013)  . Pulmonary hypertension 04/10/2016   Peak pressure 66 mmHg, Echo 04/09/2016  . S/P Maze operation for atrial fibrillation 05/16/2013   Complete bilateral atrial lesion set using cryothermy and bipolar radiofrequency ablation with oversewing of LA appendage  . S/P minimally invasive  mitral valve replacement with bioprosthetic valve and maze procedure 05/16/2013   27 mm Edwards magna mitral bovine bioprosthetic tissue valve placed via right thoracotomy  . Systolic dysfunction without heart failure 04/10/2016   EF 35-40%, ECHO 04/09/2016   Past Surgical History:  Procedure Laterality Date  . ABDOMINAL HYSTERECTOMY     Cervical Cancer  . CARDIOVERSION N/A 06/06/2013   Procedure: CARDIOVERSION;  Surgeon: Larey Dresser, MD;  Location: [place];  Service: Cardiovascular;  Laterality: N/A;  . INTRAOPERATIVE TRANSESOPHAGEAL ECHOCARDIOGRAM N/A 05/16/2013   Procedure: INTRAOPERATIVE TRANSESOPHAGEAL ECHOCARDIOGRAM;  Surgeon: Rexene Alberts, MD;  Location: [place];  Service: Open Heart Surgery;  Laterality: N/A;  . LEFT AND RIGHT HEART CATHETERIZATION WITH CORONARY ANGIOGRAM N/A 05/04/2013   Procedure: LEFT AND RIGHT HEART CATHETERIZATION WITH CORONARY ANGIOGRAM;  Surgeon: Jettie Booze, MD;  Location: [facility] CATH LAB;  Service: Cardiovascular;  Laterality: N/A;  . MINIMALLY INVASIVE MAZE PROCEDURE N/A 05/16/2013   Procedure: MINIMALLY INVASIVE MAZE PROCEDURE;  Surgeon: Rexene Alberts, MD;  Location: [place];  Service: Open Heart Surgery;  Laterality: N/A;  . MITRAL VALVE REPLACEMENT Right 05/16/2013   Procedure: MINIMALLY INVASIVE MITRAL VALVE (MV) REPLACEMENT;  Surgeon: Rexene Alberts, MD;  Location: [place];  Service: Open Heart Surgery;  Laterality: Right;  . Parathyroid/Thyroid surgery     Tumor  . TEE WITHOUT CARDIOVERSION N/A 04/06/2013   Procedure: TRANSESOPHAGEAL ECHOCARDIOGRAM (TEE);  Surgeon: [name], MD;  [facility];  Service: Cardiology;  Laterality:  N/A;  . TEE WITHOUT CARDIOVERSION N/A 06/06/2013   Procedure: TRANSESOPHAGEAL ECHOCARDIOGRAM (TEE);  Surgeon: Larey Dresser, MD;  Location: [facility];  Service: Cardiovascular;  Laterality: N/A;  . TOTAL KNEE ARTHROPLASTY Right          Allergies  Allergen Reactions  .  Indomethacin Other (See Comments)    dizziness  . Norvasc [Amlodipine Besylate] Cough        Allergies as of 04/20/2016      Reactions   Indomethacin Other (See Comments)   dizziness   Norvasc [amlodipine Besylate] Cough      Medication List         Medication Sig  . acetaminophen (TYLENOL) 325 MG tablet Take 650 mg by mouth every 4 (four) hours as needed.  Marland Kitchen amiodarone (PACERONE) 200 MG tablet Take 0.5 tablets (100 mg total) by mouth daily.  .  .  . colchicine 0.6 MG tablet Take 0.5 tablets (0.3 mg total) by mouth daily.  . ferrous sulfate 325 (65 FE) MG tablet Take 325 mg by mouth 2 (two) times daily with a meal.  . levothyroxine (SYNTHROID, LEVOTHROID) 50 MCG tablet Take 50 mcg by mouth daily before breakfast.  . Melatonin 5 MG TABS Take 5 mg by mouth at bedtime as needed. Take for 14 days for insomnia until 04/27/16  . omeprazole (PRILOSEC) 20 MG capsule Take 20 mg by mouth daily.   . potassium chloride SA (K-DUR,KLOR-CON) 20 MEQ tablet Take 1 tablet (20 mEq total) by mouth daily.  . simvastatin (ZOCOR) 10 MG tablet TAKE ONE TABLET BY MOUTH EVERY EVENING.  Marland Kitchen tolterodine (DETROL LA) 4 MG 24 hr capsule Take 4 mg by mouth daily.  Marland Kitchen torsemide (DEMADEX) 100 MG tablet TAKE ONE TABLET BY MOUTH EVERY MORNING              Review of Systems  Review of Systems  Constitutional: Negative for activity change, appetite change, chills, diaphoresis, fatigue and fever.  HENT: Negative for mouth sores, postnasal drip, rhinorrhea, sinus pain and sore throat.   Respiratory: Negative for apnea, cough, chest tightness, shortness of breath and wheezing.   Cardiovascular: Negative for chest pain, palpitations aHas venous stasis changes with bilateral lower extremity edema  Gastrointestinal: Negative for abdominal distention, abdominal pain, constipation, diarrhea, nausea and vomiting.  Genitourinary: Negative for dysuria and frequency.  Musculoskeletal: Is complaining of  left hand chronic arthritic pain Skin: Negative for rash.  Neurological: Negative for dizziness, syncope, weakness, light-headedness and numbness.  Psychiatric/Behavioral: Negative for behavioral problems, confusion and sleep disturbance.        Immunization History  Administered Date(s) Administered  . Influenza,inj,Quad PF,36+ Mos 11/04/2014  . Influenza-Unspecified 12/08/2015       Pertinent  Health Maintenance Due  Topic Date Due  . COLONOSCOPY  01/02/1991  . DEXA SCAN  01/01/2006  . PNA vac Low Risk Adult (1 of 2 - PCV13) 02/05/2017 (Originally 01/01/2006)  . INFLUENZA VACCINE  Completed   Fall Risk  09/12/2015 07/02/2015 03/19/2015 12/12/2014 12/12/2014  Falls in the past year? No No No No -  Number falls in past yr: - - - - -  Injury with Fall? - - - - -  Risk for fall due to : Medication side effect Medication side effect Impaired balance/gait Impaired balance/gait Impaired balance/gait   Temperature is 97.3 pulse 63 respirations 20 blood pressure 134/63 weight is 168.4 this appears to be stable Physical Exam  Constitutional: She is oriented to person, place, and time. She  appears well-developed and well-nourished.  HENT:  Head: Normocephalic.  Mouth/Throat: Oropharynx is clear and moist.  Eyes: Pupils are equal, round, and reactive to light.  Neck: Neck supple.  Cardiovascular: Normal rate and regular rhythm.   with occasional irregular beats Murmur heard. Pulmonary/Chest: Effort normal.   could not really appreciate any overt congestion does have reduced breath sounds Abdominal: Soft. Bowel sounds are normal. She exhibits no distension. There is no tenderness. There is no rebound.  Muscle skeletal is able to move all extremities 4 with lower extremity weakness she has arthritic changes of her hands bilaterally there is some tenderness to palpation of the palm of her left hand she says this is chronic.  Denies any pain with flexion and extension at the wrist    Neurological: She is alert and oriented to person, place, and time.  Skin:  Patient has Bilateral Moderate Edema in LE with Chronic Venous Changes--she does have some erythema of her upper left leg but she says this is been there for some time it is nontender not warm she says this is chronic    Labs reviewed:  Apr 22 2016.  Sodium 137 potassium 3.7 BUN 51 creatinine 1.74.  Feb 12th 2018.  INR-2.5.  WBC 2.1 Hemoglobin 9.9 platelets 115,000  Recent Labs (within last 365 days)   Recent Labs  04/10/16 0714 04/15/16 0700 04/19/16 0700  NA 137 138 136  K 3.6 4.0 3.2*  CL 97* 98* 92*  CO2 31 31 35*  GLUCOSE 90 96 91  BUN 34* 35* 45*  CREATININE 1.77* 1.62* 2.05*  CALCIUM 8.7* 8.9 8.7*      Recent Labs (within last 365 days)   Recent Labs  06/25/15 0541 04/09/16 0229 04/19/16 0700  AST 34 27 27  ALT 25 10* 13*  ALKPHOS 88 127* 104  BILITOT 1.6* 1.2 0.5  PROT 6.7 6.9 6.1*  ALBUMIN 2.9* 3.3* 2.6*      Recent Labs (within last 365 days)   Recent Labs  06/12/15 1128 06/24/15 1549  04/09/16 0229 04/10/16 0714 04/19/16 0700  WBC 3.6* 8.8  < > 4.7 3.0* 2.1*  NEUTROABS 2.6 7.1  --  3.8  --   --   HGB 12.1 12.4  < > 11.4* 10.4* 9.9*  HCT 37.6 39.1  < > 35.1* 32.9* 31.7*  MCV 97.9 97.0  < > 98.0 98.5 100.3*  PLT 139* 111*  < > 112* 104* 115*  < > = values in this interval not displayed.   Recent Labs       Lab Results  Component Value Date   TSH 4.713 (H) 04/09/2016     Recent Labs       Lab Results  Component Value Date   HGBA1C 5.6 04/09/2016     Recent Labs  .  Assessment plan.  #1 history of arthritic pain-she says this is chronic of her left hand but Tylenol was not effective-will try low-dose tramadol 50 mg every 6 hours as needed and monitor.  If pain persists consider obtaining an x-ray   #2-history of thrombocytopenia --with appears an element of leukopenia-will update CBC next laboratory day.  #3 history  of Systolic  CHF complicated with a history of renal insufficiency  she is on torsemide 100 milligrams a day as well as potassium supplementation-creatinine 1.74 on lab done February 15 actually shows improvement will have this updated as well and appears this has already been ordered for next Monday  She has edema venous  stasis changes but her weight has been stable she does not complain of any chest pain or increased shortness of breath but this will have to be monitored  [id number]   [phone]

## 2016-04-26 ENCOUNTER — Encounter (HOSPITAL_COMMUNITY)
Admission: RE | Admit: 2016-04-26 | Discharge: 2016-04-26 | Disposition: A | Discharge status: Home / Self Care | Payer: Medicare HMO | Source: Skilled Nursing Facility | Attending: Internal Medicine | Admitting: Internal Medicine

## 2016-04-26 ENCOUNTER — Encounter: Payer: Self-pay | Admitting: Internal Medicine

## 2016-04-26 ENCOUNTER — Non-Acute Institutional Stay (SKILLED_NURSING_FACILITY): Payer: Commercial Managed Care - HMO | Admitting: Internal Medicine

## 2016-04-26 DIAGNOSIS — I10 Essential (primary) hypertension: Secondary | ICD-10-CM | POA: Insufficient documentation

## 2016-04-26 DIAGNOSIS — K219 Gastro-esophageal reflux disease without esophagitis: Secondary | ICD-10-CM | POA: Insufficient documentation

## 2016-04-26 DIAGNOSIS — N3281 Overactive bladder: Secondary | ICD-10-CM | POA: Insufficient documentation

## 2016-04-26 DIAGNOSIS — I5032 Chronic diastolic (congestive) heart failure: Secondary | ICD-10-CM | POA: Insufficient documentation

## 2016-04-26 DIAGNOSIS — I480 Paroxysmal atrial fibrillation: Secondary | ICD-10-CM | POA: Insufficient documentation

## 2016-04-26 DIAGNOSIS — N183 Chronic kidney disease, stage 3 unspecified: Secondary | ICD-10-CM

## 2016-04-26 DIAGNOSIS — I5033 Acute on chronic diastolic (congestive) heart failure: Secondary | ICD-10-CM

## 2016-04-26 DIAGNOSIS — D631 Anemia in chronic kidney disease: Secondary | ICD-10-CM

## 2016-04-26 DIAGNOSIS — M1990 Unspecified osteoarthritis, unspecified site: Secondary | ICD-10-CM | POA: Insufficient documentation

## 2016-04-26 DIAGNOSIS — E784 Other hyperlipidemia: Secondary | ICD-10-CM | POA: Insufficient documentation

## 2016-04-26 DIAGNOSIS — Z5189 Encounter for other specified aftercare: Secondary | ICD-10-CM | POA: Insufficient documentation

## 2016-04-26 LAB — BASIC METABOLIC PANEL
ANION GAP: 7 (ref 5–15)
BUN: 62 mg/dL — ABNORMAL HIGH (ref 6–20)
CO2: 38 mmol/L — ABNORMAL HIGH (ref 22–32)
Calcium: 9.1 mg/dL (ref 8.9–10.3)
Chloride: 92 mmol/L — ABNORMAL LOW (ref 101–111)
Creatinine, Ser: 1.68 mg/dL — ABNORMAL HIGH (ref 0.44–1.00)
GFR, EST AFRICAN AMERICAN: 33 mL/min — AB (ref >60)
GFR, EST NON AFRICAN AMERICAN: 29 mL/min — AB (ref >60)
Glucose, Bld: 95 mg/dL (ref 65–99)
POTASSIUM: 4.1 mmol/L (ref 3.5–5.1)
Sodium: 137 mmol/L (ref 135–145)

## 2016-04-26 LAB — CBC WITH DIFFERENTIAL/PLATELET
BASOS ABS: 0 10*3/uL (ref 0.0–0.1)
BASOS PCT: 0 %
EOS ABS: 0.1 10*3/uL (ref 0.0–0.7)
EOS PCT: 3 %
HCT: 26.9 % — ABNORMAL LOW (ref 36.0–46.0)
Hemoglobin: 8.6 g/dL — ABNORMAL LOW (ref 12.0–15.0)
LYMPHS PCT: 22 %
Lymphs Abs: 0.8 10*3/uL (ref 0.7–4.0)
MCH: 32.1 pg (ref 26.0–34.0)
MCHC: 32 g/dL (ref 30.0–36.0)
MCV: 100.4 fL — ABNORMAL HIGH (ref 78.0–100.0)
Monocytes Absolute: 0.3 10*3/uL (ref 0.1–1.0)
Monocytes Relative: 8 %
Neutro Abs: 2.3 10*3/uL (ref 1.7–7.7)
Neutrophils Relative %: 67 %
PLATELETS: 103 10*3/uL — AB (ref 150–400)
RBC: 2.68 MIL/uL — AB (ref 3.87–5.11)
RDW: 16.2 % — ABNORMAL HIGH (ref 11.5–15.5)
WBC: 3.5 10*3/uL — AB (ref 4.0–10.5)

## 2016-04-26 LAB — PROTIME-INR
INR: 4.91 — AB
Prothrombin Time: 47.6 seconds — ABNORMAL HIGH (ref 11.4–15.2)

## 2016-04-26 NOTE — Progress Notes (Signed)
Location:   [facility] Room Number: 144/P Place of Service:  SNF (31) Provider:  Aldean Baker, MD  Patient Care Team: [name], MD as PCP - General (Cardiology)  Extended Emergency Contact Information Primary Emergency Contact: Hodges,[name] Address: [phone] [facility] [address] [place] [address] [place] Phone: [phone] Mobile Phone: [phone] Relation: Daughter Secondary Emergency Contact: Alyson Ingles, [place] [address] [place] of [place] Phone: [phone] Mobile Phone: [phone] Relation: Grandaughter  Code Status:  Full Code Goals of care: Advanced Directive information Advanced Directives 04/26/2016  Does Patient Have a Medical Advance Directive? Yes  Type of Advance Directive (No Data)  Does patient want to make changes to medical advance directive? No - Patient declined  Copy of [place] in Chart? -  Would patient like information on creating a medical advance directive? -  Pre-existing out of facility DNR order (yellow form or pink MOST form) -     Chief Complaint  Patient presents with  . Acute Visit    Increased BUN and Anemia    HPI:  Pt is a 76 y.o. female seen today for an acute visit for Worsening BUN and HGB.She also had elevated INR today. Patient has multiple medical problems including PAF on Chronic coumadin, S/P Maze procedure, S/P Bioprosthetic Mitral valve, h/o TIA, Chronic Renal disease, And Syatolic and Diastolic CHF With Last 2D echo showed EF of 35-40%  Patient was treated with Metolazone for few days for Pulmonary edema. She has been off it now for more then a week. But her BUN today was again elevated from 50 to 62 . Though Creat was at her Baseline of 1.68. Patient INR was also elevated to 4.91 on 2 mg of coumadin which has not been changed recently. Patient is doing well . She has good appetite with no nausea or vomiting. No  diarrhea. Her breathing is good and no cough or sputum. Her weight has been stable at 168 lbs . No recent change in the weight. Past Medical History:  Diagnosis Date  . Aortic insufficiency due to bicuspid aortic valve    Moderate by TEE  . Arthritis   . Chronic diastolic congestive heart failure ([place])    a) ECHO (09/2013) EF 50-55%  . Chronic respiratory failure with hypoxia (HCC) 04/10/2016   On 2 L nasal cannula oxygen chronically.  . CKD (chronic kidney disease) stage 4, GFR 15-29 ml/min (HCC)   . COPD (chronic obstructive pulmonary disease) ([place])   . Essential hypertension, benign   . History of stroke    2000 [place]  . History of TIA (transient ischemic attack)    Diagnosed 2003  . Hyperlipidemia   . Juvenile rheumatic fever    Age 28  . 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 NICM (nonischemic cardiomyopathy) ([place])    a) LHC (04/2013) no significant CAD  . Obesity   . Paroxysmal atrial fibrillation (HCC)    s/p MAZE (05/2013)  . Pulmonary hypertension 04/10/2016   Peak pressure 66 mmHg, Echo 04/09/2016  . S/P Maze operation for atrial fibrillation 05/16/2013   Complete bilateral atrial lesion set using cryothermy and bipolar radiofrequency ablation with oversewing of LA appendage  . S/P minimally invasive mitral valve replacement with bioprosthetic valve and maze procedure 05/16/2013   27 mm Edwards magna mitral bovine bioprosthetic  tissue valve placed via right thoracotomy  . Systolic dysfunction without heart failure 04/10/2016   EF 35-40%, ECHO 04/09/2016   Past Surgical History:  Procedure Laterality Date  . ABDOMINAL HYSTERECTOMY     Cervical Cancer  . CARDIOVERSION N/A 06/06/2013   Procedure: CARDIOVERSION;  Surgeon: Larey Dresser, MD;  Location: [facility];  Service: Cardiovascular;  Laterality: N/A;  . INTRAOPERATIVE TRANSESOPHAGEAL ECHOCARDIOGRAM N/A 05/16/2013   Procedure: INTRAOPERATIVE TRANSESOPHAGEAL ECHOCARDIOGRAM;  Surgeon: Rexene Alberts,  MD;  Location: [facility];  Service: Open Heart Surgery;  Laterality: N/A;  . LEFT AND RIGHT HEART CATHETERIZATION WITH CORONARY ANGIOGRAM N/A 05/04/2013   Procedure: LEFT AND RIGHT HEART CATHETERIZATION WITH CORONARY ANGIOGRAM;  Surgeon: Jettie Booze, MD;  Location: [facility] CATH LAB;  Service: Cardiovascular;  Laterality: N/A;  . MINIMALLY INVASIVE MAZE PROCEDURE N/A 05/16/2013   Procedure: MINIMALLY INVASIVE MAZE PROCEDURE;  Surgeon: Rexene Alberts, MD;  Location: [facility];  Service: Open Heart Surgery;  Laterality: N/A;  . MITRAL VALVE REPLACEMENT Right 05/16/2013   Procedure: MINIMALLY INVASIVE MITRAL VALVE (MV) REPLACEMENT;  Surgeon: Rexene Alberts, MD;  Location: [facility];  Service: Open Heart Surgery;  Laterality: Right;  . Parathyroid/Thyroid surgery     Tumor  . TEE WITHOUT CARDIOVERSION N/A 04/06/2013   Procedure: TRANSESOPHAGEAL ECHOCARDIOGRAM (TEE);  Surgeon: [name], MD;  [facility];  Service: Cardiology;  Laterality: N/A;  . TEE WITHOUT CARDIOVERSION N/A 06/06/2013   Procedure: TRANSESOPHAGEAL ECHOCARDIOGRAM (TEE);  Surgeon: Larey Dresser, MD;  Location: [facility];  Service: Cardiovascular;  Laterality: N/A;  . TOTAL KNEE ARTHROPLASTY Right     Allergies  Allergen Reactions  . Indomethacin Other (See Comments)    dizziness  . Norvasc [Amlodipine Besylate] Cough    Current Outpatient Prescriptions on File Prior to Visit  Medication Sig Dispense Refill  . acetaminophen (TYLENOL) 325 MG tablet Take 650 mg by mouth every 4 (four) hours as needed.    Marland Kitchen amiodarone (PACERONE) 200 MG tablet Take 0.5 tablets (100 mg total) by mouth daily. 30 tablet 6  . colchicine 0.6 MG tablet Take 0.5 tablets (0.3 mg total) by mouth daily. 30 tablet 0  . ferrous sulfate 325 (65 FE) MG tablet Take 325 mg by mouth 2 (two) times daily with a meal.    . levothyroxine (SYNTHROID, LEVOTHROID) 50 MCG tablet Take 50 mcg by mouth daily before breakfast.    . Melatonin 5 MG TABS Take 5 mg by  mouth at bedtime as needed. Take for 14 days for insomnia until 04/27/16    . omeprazole (PRILOSEC) 20 MG capsule Take 20 mg by mouth daily.     . simvastatin (ZOCOR) 10 MG tablet TAKE ONE TABLET BY MOUTH EVERY EVENING. 30 tablet 3  . tolterodine (DETROL LA) 4 MG 24 hr capsule Take 4 mg by mouth daily.    Marland Kitchen torsemide (DEMADEX) 100 MG tablet TAKE ONE TABLET BY MOUTH EVERY MORNING 90 tablet 3   No current facility-administered medications on file prior to visit.      Review of Systems  ROS Review of Systems  Constitutional: Negative for activity change, appetite change, chills, diaphoresis, fatigue and fever.  HENT: Negative for mouth sores, postnasal drip, rhinorrhea, sinus pain and sore throat.   Respiratory: Negative for apnea, occasional cough, No chest tightness, shortness of breath or wheezing.   Cardiovascular: Negative for chest pain, palpitations Gastrointestinal: Negative for abdominal distention, abdominal pain, constipation, diarrhea, nausea and vomiting.  Genitourinary: Negative for  dysuria and frequency.  Musculoskeletal: Negative for arthralgias, joint swelling and myalgias.  Skin: Negative for rash.  Neurological: Negative for dizziness, syncope, weakness, light-headedness and numbness.  Psychiatric/Behavioral: Negative for behavioral problems, confusion and sleep disturbance.    Immunization History  Administered Date(s) Administered  . Influenza,inj,Quad PF,36+ Mos 11/04/2014  . Influenza-Unspecified 12/08/2015   Pertinent  Health Maintenance Due  Topic Date Due  . COLONOSCOPY  01/02/1991  . DEXA SCAN  01/01/2006  . PNA vac Low Risk Adult (1 of 2 - PCV13) 02/05/2017 (Originally 01/01/2006)  . INFLUENZA VACCINE  Completed   Fall Risk  09/12/2015 07/02/2015 03/19/2015 12/12/2014 12/12/2014  Falls in the past year? No No No No -  Number falls in past yr: - - - - -  Injury with Fall? - - - - -  Risk for fall due to : Medication side effect Medication side effect Impaired  balance/gait Impaired balance/gait Impaired balance/gait   Functional Status Survey:    Vitals:   04/26/16 1144  BP: (!) 166/72  Pulse: (!) 56  Resp: 20  Temp: 98.3 F (36.8 C)  TempSrc: Oral  SpO2: 96%   There is no height or weight on file to calculate BMI. Physical Exam  Constitutional: She is oriented to person, place, and time. She appears well-developed and well-nourished.  HENT:  Head: Normocephalic.  Mouth/Throat: Oropharynx is clear and moist.  Eyes: Pupils are equal, round, and reactive to light.  Neck: Neck supple.  Cardiovascular: Normal rate.   Murmur heard. Pulmonary/Chest: Effort normal.  Few Crackles at the bases of lungs B/L  Abdominal: Soft. Bowel sounds are normal. She exhibits no distension. There is no tenderness. There is no rebound.  Musculoskeletal:  Chronic venous stasis Changes in Both LE.  Neurological: She is alert and oriented to person, place, and time.  Skin: Skin is warm and dry.  Psychiatric: She has a normal mood and affect. Her behavior is normal.    Labs reviewed:  Recent Labs  04/19/16 0700 04/22/16 0830 04/26/16 0700  NA 136 137 137  K 3.2* 3.7 4.1  CL 92* 91* 92*  CO2 35* 37* 38*  GLUCOSE 91 101* 95  BUN 45* 51* 62*  CREATININE 2.05* 1.74* 1.68*  CALCIUM 8.7* 8.9 9.1    Recent Labs  06/25/15 0541 04/09/16 0229 04/19/16 0700  AST 34 27 27  ALT 25 10* 13*  ALKPHOS 88 127* 104  BILITOT 1.6* 1.2 0.5  PROT 6.7 6.9 6.1*  ALBUMIN 2.9* 3.3* 2.6*    Recent Labs  06/24/15 1549  04/09/16 0229 04/10/16 0714 04/19/16 0700 04/26/16 0700  WBC 8.8  < > 4.7 3.0* 2.1* 3.5*  NEUTROABS 7.1  --  3.8  --   --  2.3  HGB 12.4  < > 11.4* 10.4* 9.9* 8.6*  HCT 39.1  < > 35.1* 32.9* 31.7* 26.9*  MCV 97.0  < > 98.0 98.5 100.3* 100.4*  PLT 111*  < > 112* 104* 115* 103*  < > = values in this interval not displayed. Lab Results  Component Value Date   TSH 4.713 (H) 04/09/2016   Lab Results  Component Value Date   HGBA1C 5.6  04/09/2016   Lab Results  Component Value Date   CHOL 95 04/09/2016   HDL 50 04/09/2016   LDLCALC 28 04/09/2016   TRIG 83 04/09/2016   CHOLHDL 1.9 04/09/2016    Significant Diagnostic Results in last 30 days:  Ct Head Wo Contrast  Result Date: 04/09/2016 CLINICAL  DATA:  Weakness and left lower extremity.  Now resolved. EXAM: CT HEAD WITHOUT CONTRAST TECHNIQUE: Contiguous axial images were obtained from the base of the skull through the vertex without intravenous contrast. COMPARISON:  Head CT 11/15/2013 FINDINGS: Brain: No evidence of acute infarction, hemorrhage, hydrocephalus, extra-axial collection or mass lesion/mass effect. Moderate cerebral atrophy and chronic small vessel ischemia. Remote lacunar infarcts in the basal ganglia bilaterally. Vascular: Atherosclerosis of skullbase vasculature without hyperdense vessel or abnormal calcification. Skull: Normal. Negative for fracture or focal lesion. Sinuses/Orbits: Paranasal sinuses and mastoid air cells are clear. The visualized orbits are unremarkable. Bilateral cataract resection. Other: None. IMPRESSION: No acute intracranial abnormality. Atrophy, remote and chronic small vessel ischemic change. Electronically Signed   By: Jeb Levering M.D.   On: 04/09/2016 03:07   Mr Brain Wo Contrast  Result Date: 04/09/2016 CLINICAL DATA:  76 year old female with altered mental status. Weakness and recent fall. Initial encounter. EXAM: MRI HEAD WITHOUT CONTRAST TECHNIQUE: Multiplanar, multiecho pulse sequences of the brain and surrounding structures were obtained without intravenous contrast. COMPARISON:  Head CT without contrast 0244 hours today and earlier. FINDINGS: Brain: No restricted diffusion to suggest acute infarction. No midline shift, mass effect, evidence of mass lesion, ventriculomegaly, extra-axial collection or acute intracranial hemorrhage. Cervicomedullary junction and pituitary are within normal limits. Patchy periventricular white  matter T2 and FLAIR hyperintensity. Moderate T2 heterogeneity throughout the bilateral basal ganglia and thalami, but at least in part related to perivascular spaces. Small chronic lacunar infarct in the left cerebellum (series 7, image 5). Minimal T2 heterogeneity in the left pons. No cortical encephalomalacia identified. There are occasional chronic micro hemorrhages in the brain, including the left occipital lobe (series 10, image 7). Vascular: Major intracranial vascular flow voids are preserved. Persistent right trigeminal artery, better demonstrated on today MRA. Skull and upper cervical spine: Negative. Sinuses/Orbits: Postoperative changes to both globes, otherwise negative. Other: Visible internal auditory structures appear normal. Mild left mastoid effusion is stable since 2015. Negative nasopharynx. Negative scalp soft tissues. IMPRESSION: 1. No acute intracranial abnormality. 2. Moderate for age signal changes, most pronounced in the cerebral white matter and deep gray matter nuclei, most compatible with chronic small vessel disease. Electronically Signed   By: Genevie Ann M.D.   On: 04/09/2016 09:06   US Carotid Bilateral (at [facility] And [facility] Only)  Result Date: 04/09/2016 CLINICAL DATA:  Left lower extremity weakness. EXAM: BILATERAL CAROTID DUPLEX ULTRASOUND TECHNIQUE: Pearline Cables scale imaging, color Doppler and duplex ultrasound were performed of bilateral carotid and vertebral arteries in the neck. COMPARISON:  CT 04/09/2016 . FINDINGS: Criteria: Quantification of carotid stenosis is based on velocity parameters that correlate the residual internal carotid diameter with NASCET-based stenosis levels, using the diameter of the distal internal carotid lumen as the denominator for stenosis measurement. The following velocity measurements were obtained: RIGHT ICA:  65/14 cm/sec CCA:  123456 cm/sec SYSTOLIC ICA/CCA RATIO:  0.7 DIASTOLIC ICA/CCA RATIO:  1.4 ECA:  141 cm/sec LEFT ICA:  54/8 cm/sec CCA:  [id number] cm/sec  SYSTOLIC ICA/CCA RATIO:  0.7 DIASTOLIC ICA/CCA RATIO:  0.7 ECA:  126 cm/sec RIGHT CAROTID ARTERY: Mild right carotid bifurcation /proximal ICA atherosclerotic vascular disease. No flow limiting stenosis. RIGHT VERTEBRAL ARTERY:  Patent with antegrade flow. LEFT CAROTID ARTERY: Mild left distal common carotid/carotid bifurcation atherosclerotic-disease. No flow limiting stenosis . LEFT VERTEBRAL ARTERY:  Patent with antegrade flow. IMPRESSION: 1. Mild right carotid bifurcation/ proximal ICA atherosclerotic vascular disease. No flow limiting stenosis. Degree of stenosis less than 50%. 2.  Mild left distal common carotid/ carotid bifurcation atherosclerotic vascular disease. No flow limiting stenosis. Degree of stenosis less than 50%. 3.  Vertebral arteries are patent antegrade flow. Electronically Signed   By: Marcello Moores  Register   On: 04/09/2016 08:57   Mr Jodene Nam Head/brain Wo Cm  Result Date: 04/09/2016 CLINICAL DATA:  76 year old female with altered mental status. Weakness and recent fall. Initial encounter. EXAM: MRA HEAD WITHOUT CONTRAST TECHNIQUE: Angiographic images of the Circle of Willis were obtained using MRA technique without intravenous contrast. COMPARISON:  Brain MRI from today reported separately. Carotid Doppler ultrasound from today. FINDINGS: Antegrade flow in the posterior circulation. Mildly dominant distal left vertebral artery. No distal vertebral stenosis. The right PICA origin is normal. Patent vertebrobasilar junction. Persistent right side trigeminal artery (normal anatomic variant). SCA and PCA origins are normal. Posterior communicating arteries are diminutive or absent. Bilateral PCA branches are within normal limits. Antegrade flow in both ICA siphons. Mild siphon irregularity greater on the left with no associated siphon stenosis. Normal ophthalmic artery origins. Normal carotid termini, MCA and ACA origins. Diminutive or absent anterior communicating artery. Visualized ACA branches are  normal. MCA M1 segments, bifurcations, and visualized bilateral MCA branches are within normal limits. IMPRESSION: 1.  Negative intracranial MRA. 2. Persistent right trigeminal artery, an unusual normal anatomic variation. Electronically Signed   By: Genevie Ann M.D.   On: 04/09/2016 09:04    Assessment/Plan Acute on chronic diastolic heart failure [facility]) Patient has increased BUN recently. Will decrease her dose of Torsemide as she looks little Dry. Will decrease it to 50 mg Qd. Repeat BMP on thurs. Will probably have to adjust the dose.  CKD (chronic kidney disease) stage 3, GFR 30-59 ml/min Some worsening of Renal function. Possible due to Dehydration. Will repeat after decreasing the dose of torsemide.  Anemia in stage 3 chronic kidney disease Hgb has been drifting down. Will check Stool for blood. Already on iron supplements and Prilosec.  Paroxysmal atrial fibrillation (HCC) INR suddenly high today on same dose of Cumin for past few weeks. Will hold Coumadin today.    Family/ staff Communication:  Labs/tests ordered:  Repeat CBC, CMP and INR

## 2016-04-27 ENCOUNTER — Encounter (HOSPITAL_COMMUNITY)
Admission: RE | Admit: 2016-04-27 | Discharge: 2016-04-27 | Disposition: A | Discharge status: Home / Self Care | Payer: Medicare HMO | Source: Skilled Nursing Facility | Attending: Internal Medicine | Admitting: Internal Medicine

## 2016-04-27 DIAGNOSIS — K219 Gastro-esophageal reflux disease without esophagitis: Secondary | ICD-10-CM | POA: Insufficient documentation

## 2016-04-27 DIAGNOSIS — E784 Other hyperlipidemia: Secondary | ICD-10-CM | POA: Insufficient documentation

## 2016-04-27 DIAGNOSIS — Z5189 Encounter for other specified aftercare: Secondary | ICD-10-CM | POA: Insufficient documentation

## 2016-04-27 DIAGNOSIS — M1990 Unspecified osteoarthritis, unspecified site: Secondary | ICD-10-CM | POA: Insufficient documentation

## 2016-04-27 DIAGNOSIS — I480 Paroxysmal atrial fibrillation: Secondary | ICD-10-CM | POA: Insufficient documentation

## 2016-04-27 DIAGNOSIS — N3281 Overactive bladder: Secondary | ICD-10-CM | POA: Insufficient documentation

## 2016-04-27 LAB — PROTIME-INR
INR: 4.55
Prothrombin Time: 43.9 seconds — ABNORMAL HIGH (ref 11.4–15.2)

## 2016-04-28 ENCOUNTER — Encounter: Payer: Self-pay | Admitting: Internal Medicine

## 2016-04-28 ENCOUNTER — Non-Acute Institutional Stay (SKILLED_NURSING_FACILITY): Payer: Commercial Managed Care - HMO | Admitting: Internal Medicine

## 2016-04-28 ENCOUNTER — Other Ambulatory Visit (HOSPITAL_COMMUNITY)
Admission: AD | Admit: 2016-04-28 | Discharge: 2016-04-28 | Disposition: A | Discharge status: Home / Self Care | Payer: Medicare HMO | Source: Skilled Nursing Facility | Attending: Internal Medicine | Admitting: Internal Medicine

## 2016-04-28 DIAGNOSIS — I480 Paroxysmal atrial fibrillation: Secondary | ICD-10-CM | POA: Insufficient documentation

## 2016-04-28 DIAGNOSIS — Z7901 Long term (current) use of anticoagulants: Secondary | ICD-10-CM | POA: Diagnosis not present

## 2016-04-28 DIAGNOSIS — I4891 Unspecified atrial fibrillation: Secondary | ICD-10-CM | POA: Diagnosis not present

## 2016-04-28 DIAGNOSIS — R5381 Other malaise: Secondary | ICD-10-CM

## 2016-04-28 DIAGNOSIS — I5042 Chronic combined systolic (congestive) and diastolic (congestive) heart failure: Secondary | ICD-10-CM

## 2016-04-28 DIAGNOSIS — D509 Iron deficiency anemia, unspecified: Secondary | ICD-10-CM | POA: Diagnosis not present

## 2016-04-28 LAB — PROTIME-INR
INR: 3.2
PROTHROMBIN TIME: 33.5 s — AB (ref 11.4–15.2)

## 2016-04-28 NOTE — Progress Notes (Signed)
Location:   [facility] Room Number: 144/P Place of Service:  SNF (31)  Provider: Granville Lewis  PCP: [name], MD Patient Care Team: [name], MD as PCP - General (Cardiology)  Extended Emergency Contact Information Primary Emergency Contact: Hodges,[name] Address: [phone] [facility] [address] [place] [address] [place] Phone: [phone] Mobile Phone: [phone] Relation: Daughter Secondary Emergency Contact: Alyson Ingles, [place] [address] [place] Phone: [phone] Mobile Phone: [phone] Relation: Grandaughter  Code Status: Full Code Goals of care:  Advanced Directive information Advanced Directives 04/28/2016  Does Patient Have a Medical Advance Directive? Yes  Type of Advance Directive (No Data)  Does patient want to make changes to medical advance directive? No - Patient declined  Copy of [place] in Chart? -  Would patient like information on creating a medical advance directive? -  Pre-existing out of facility DNR order (yellow form or pink MOST form) -     Allergies  Allergen Reactions  . Indomethacin Other (See Comments)    dizziness  . Norvasc [Amlodipine Besylate] Cough    Chief Complaint  Patient presents with  . Discharge Note    HPI:  76 y.o. female  seen today for discharge slated for tomorrow.  She was initially hospitalized for weakness and was here for rehabilitation.  Her chest x-ray also showed pulmonary congestion and a right lower lobe pneumonia and she has completed antibiotic.  She also was initially on metolazone for 5 days. She is now on torsemide.  -Her weight appears to be stable at 170 pounds she is oxygen dependent-but this has been stable during her stay here  Her BUN was recently elevated in the 60s and her torsemide was reduced down to 50 mg a day-she is also on Lasix every other day-with potassium supplementation-I note  dated BMP has been ordered for tomorrow before discharge.  She does have a history of chronic kidney disease last creatinine of 1.68 showed stability to some improvement again this will be updated tomorrow.  I did see her for some left hand pain last week she said this was chronic and did start low-dose tramadol she says this helps but she would like it more frequently and we will write an order for that.  Labs also showed her hemoglobin was trending somewhat down at 8.6 she is on iron stool guaiacs have been ordered so far no results-updated CBC is pending for tomorrow she says she actually feels stronger does not complain of increased weakness from baseline or shortness of breath.  She also has history of atrial fibrillation INR has been high actually was 4.55 yesterday and it was a bit higher than that the day before it has come down 2-3 0.2 on lab done today this will need to be rechecked tomorrow as well.  Her other medical issues appear to be stable she does have a history of gout has been on colchicine has not really had significant flares here.  She also has a history hypothyroidism she is on Synthroid TSH and T4 were mildly elevated on previous labs will see if we can add this to her labs for tomorrow with follow-up by primary care provider.  Currently she is sitting in her wheelchair comfortably has just had visitors appears to be good spirits is looking forward to going home she will be with her daughter she  will need continued PT and OT for strengthening as well as RN support for multiple medical issues she has all her DME including oxygen already at home.            Past Medical History:  Diagnosis Date  . Aortic insufficiency due to bicuspid aortic valve    Moderate by TEE  . Arthritis   . Chronic diastolic congestive heart failure ([place])    a) ECHO (09/2013) EF 50-55%  . Chronic respiratory failure with hypoxia (HCC) 04/10/2016   On 2 L nasal cannula oxygen chronically.    . CKD (chronic kidney disease) stage 4, GFR 15-29 ml/min (HCC)   . COPD (chronic obstructive pulmonary disease) ([place])   . Essential hypertension, benign   . History of stroke    2000 [place]  . History of TIA (transient ischemic attack)    Diagnosed 2003  . Hyperlipidemia   . Juvenile rheumatic fever    Age 81  . 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 NICM (nonischemic cardiomyopathy) ([place])    a) LHC (04/2013) no significant CAD  . Obesity   . Paroxysmal atrial fibrillation (HCC)    s/p MAZE (05/2013)  . Pulmonary hypertension 04/10/2016   Peak pressure 66 mmHg, Echo 04/09/2016  . S/P Maze operation for atrial fibrillation 05/16/2013   Complete bilateral atrial lesion set using cryothermy and bipolar radiofrequency ablation with oversewing of LA appendage  . 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 Systolic dysfunction without heart failure 04/10/2016   EF 35-40%, ECHO 04/09/2016    Past Surgical History:  Procedure Laterality Date  . ABDOMINAL HYSTERECTOMY     Cervical Cancer  . CARDIOVERSION N/A 06/06/2013   Procedure: CARDIOVERSION;  Surgeon: Larey Dresser, MD;  Location: [facility];  Service: Cardiovascular;  Laterality: N/A;  . INTRAOPERATIVE TRANSESOPHAGEAL ECHOCARDIOGRAM N/A 05/16/2013   Procedure: INTRAOPERATIVE TRANSESOPHAGEAL ECHOCARDIOGRAM;  Surgeon: Rexene Alberts, MD;  Location: [facility];  Service: Open Heart Surgery;  Laterality: N/A;  . LEFT AND RIGHT HEART CATHETERIZATION WITH CORONARY ANGIOGRAM N/A 05/04/2013   Procedure: LEFT AND RIGHT HEART CATHETERIZATION WITH CORONARY ANGIOGRAM;  Surgeon: Jettie Booze, MD;  [facility] C[facility];  Service: Cardiovascular;  Laterality: N/A;  . MINIMALLY INVASIVE MAZE PROCEDURE N/A 05/16/2013   Procedure: MINIMALLY INVASIVE MAZE PROCEDURE;  Surgeon:  Rexene Alberts, MD;  Location: [facility];  Service: Open Heart Surgery;  Laterality: N/A;  . MITRAL VALVE REPLACEMENT Right 05/16/2013   Procedure: MINIMALLY INVASIVE MITRAL VALVE (MV) REPLACEMENT;  Surgeon: Rexene Alberts, MD;  Location: [facility];  Service: Open Heart Surgery;  Laterality: Right;  . Parathyroid/Thyroid surgery     Tumor  . TEE WITHOUT CARDIOVERSION N/A 04/06/2013   Procedure: TRANSESOPHAGEAL ECHOCARDIOGRAM (TEE);  Surgeon: [name], MD;  [facility];  Service: Cardiology;  Laterality: N/A;  . TEE WITHOUT CARDIOVERSION N/A 06/06/2013   Procedure: TRANSESOPHAGEAL ECHOCARDIOGRAM (TEE);  Surgeon: Larey Dresser, MD;  Location: [facility];  Service: Cardiovascular;  Laterality: N/A;  . TOTAL KNEE ARTHROPLASTY Right       reports that she has never smoked. She has never used smokeless tobacco. She reports that she does not drink alcohol or use drugs. Social History   Social History  . Marital status: Widowed    Spouse name: N/A  . Number of children:  N/A  . Years of education: N/A   Occupational History  . Not on file.   Social History Main Topics  . Smoking status: Never Smoker  . Smokeless tobacco: Never Used  . Alcohol use No  . Drug use: No  . Sexual activity: Not on file   Other Topics Concern  . Not on file   Social History Narrative   Lives in 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 Indomethacin Other (See Comments)    dizziness  . Norvasc [Amlodipine Besylate] Cough    Pertinent  Health Maintenance Due  Topic Date Due  . COLONOSCOPY  01/02/1991  . DEXA SCAN  01/01/2006  . PNA vac Low Risk Adult (1 of 2 - PCV13) 02/05/2017 (Originally 01/01/2006)  . INFLUENZA VACCINE  Completed    Medications: Current Outpatient Prescriptions on File Prior to Visit  Medication Sig Dispense Refill  . acetaminophen (TYLENOL) 325 MG  tablet Take 650 mg by mouth every 4 (four) hours as needed.    [place] Kitchen amiodarone (PACERONE) 200 MG tablet Take 0.5 tablets (100 mg total) by mouth daily. 30 tablet 6  . colchicine 0.6 MG tablet Take 0.5 tablets (0.3 mg total) by mouth daily. 30 tablet 0  . ferrous sulfate 325 (65 FE) MG tablet Take 325 mg by mouth 2 (two) times daily with a meal.    . levothyroxine (SYNTHROID, LEVOTHROID) 50 MCG tablet Take 50 mcg by mouth daily before breakfast.    . omeprazole (PRILOSEC) 20 MG capsule Take 20 mg by mouth daily.     . potassium chloride SA (K-DUR,KLOR-CON) 20 MEQ tablet Take 20 mEq by mouth daily.    . simvastatin (ZOCOR) 10 MG tablet TAKE ONE TABLET BY MOUTH EVERY EVENING. 30 tablet 3  . tolterodine (DETROL LA) 4 MG 24 hr capsule Take 4 mg by mouth daily.    . traMADol (ULTRAM) 50 MG tablet Take 25 mg by mouth every 6 (six) hours as needed.      No current facility-administered medications on file prior to visit.     Review of Systems  In general she is not complaining of any fever or chills weight appears to be relatively stable although still above her dry weight of 162.  Skin is not clear rashes or itching does have somewhat chronic bruising and venous stasis changes of her lower legs bilaterally which is not new.  Head ears or nose mouth and throat does not complain of sore throat or visual changes.  Respiratory is not complaining of cough or shortness of breath .  Cardiac does not complaining of chest pain palpitations does have venous stasis changes some edema lower extremities bilaterally which appears relatively baseline.  GI is not complaining of any nausea vomiting diarrhea constipation-barely she has had some diarrhea at one point C. difficile cultures are pending.  GU is not complaining of dysuria.  Muscle skeletal is not really complain of joint pain other than the left hand chronic pain as noted above.  Neurologic is not complaining of dizziness headache or syncope or  numbness.  Psych appears to be in good spirits does not complaining of depression or anxiety   Vitals:   04/28/16 1144  BP: (!) 123/42  Pulse: 69  Resp: 18  Temp: 97.5 F (36.4 C)  TempSrc: Oral  SpO2: 94%  Weight is 170 pounds Physical Exam   In general this is a pleasant elderly female in no distress sitting comfortably in her wheelchair.  Her skin is warm and dry she does have some scattered bruising as well as venous stasis changes of her lower extremities bilaterally.  Oropharynx clear mucous membranes moist.  Chest is clear to auscultation there is no labored breathing they're somewhat shallow air entry.  Heart is regular irregular rate and rhythm with a 2/6 murmur-she has venous stasis changes/edema appears relatively baseline a bit more on the left versus the right she says this is chronic pedal pulse is probable.  Abdomen is soft nontender with positive bowel sounds.  Musculoskeletal is able to move all extremities 4 does have arthritic changes of her hands including the left hand with some tenderness to palpation.  Neurologic is grossly intact speech is clear she is alert.  Psych she is alert and oriented pleasant and appropriate  Labs reviewed: Basic Metabolic Panel:  Recent Labs  04/19/16 0700 04/22/16 0830 04/26/16 0700  NA 136 137 137  K 3.2* 3.7 4.1  CL 92* 91* 92*  CO2 35* 37* 38*  GLUCOSE 91 101* 95  BUN 45* 51* 62*  CREATININE 2.05* 1.74* 1.68*  CALCIUM 8.7* 8.9 9.1   Liver Function Tests:  Recent Labs  06/25/15 0541 04/09/16 0229 04/19/16 0700  AST 34 27 27  ALT 25 10* 13*  ALKPHOS 88 127* 104  BILITOT 1.6* 1.2 0.5  PROT 6.7 6.9 6.1*  ALBUMIN 2.9* 3.3* 2.6*   No results for input(s): LIPASE, AMYLASE in the last 8760 hours. No results for input(s): AMMONIA in the last 8760 hours. CBC:  Recent Labs  06/24/15 1549  04/09/16 0229 04/10/16 0714 04/19/16 0700 04/26/16 0700  WBC 8.8  < > 4.7 3.0* 2.1* 3.5*  NEUTROABS 7.1  --   3.8  --   --  2.3  HGB 12.4  < > 11.4* 10.4* 9.9* 8.6*  HCT 39.1  < > 35.1* 32.9* 31.7* 26.9*  MCV 97.0  < > 98.0 98.5 100.3* 100.4*  PLT 111*  < > 112* 104* 115* 103*  < > = values in this interval not displayed. Cardiac Enzymes: No results for input(s): CKTOTAL, CKMB, CKMBINDEX, TROPONINI in the last 8760 hours. BNP: Invalid input(s): POCBNP CBG: No results for input(s): GLUCAP in the last 8760 hours.  Procedures and Imaging Studies During Stay: Ct Head Wo Contrast  Result Date: 04/09/2016 CLINICAL DATA:  Weakness and left lower extremity.  Now resolved. EXAM: CT HEAD WITHOUT CONTRAST TECHNIQUE: Contiguous axial images were obtained from the base of the skull through the vertex without intravenous contrast. COMPARISON:  Head CT 11/15/2013 FINDINGS: Brain: No evidence of acute infarction, hemorrhage, hydrocephalus, extra-axial collection or mass lesion/mass effect. Moderate cerebral atrophy and chronic small vessel ischemia. Remote lacunar infarcts in the basal ganglia bilaterally. Vascular: Atherosclerosis of skullbase vasculature without hyperdense vessel or abnormal calcification. Skull: Normal. Negative for fracture or focal lesion. Sinuses/Orbits: Paranasal sinuses and mastoid air cells are clear. The visualized orbits are unremarkable. Bilateral cataract resection. Other: None. IMPRESSION: No acute intracranial abnormality. Atrophy, remote and chronic small vessel ischemic change. Electronically Signed   By: Jeb Levering M.D.   On: 04/09/2016 03:07   Mr Brain Wo Contrast  Result Date: 04/09/2016 CLINICAL DATA:  76 year old female with altered mental status. Weakness and recent fall. Initial encounter. EXAM: MRI HEAD WITHOUT CONTRAST TECHNIQUE: Multiplanar, multiecho pulse sequences of the brain and surrounding structures were obtained  without intravenous contrast. COMPARISON:  Head CT without contrast 0244 hours today and earlier. FINDINGS: Brain: No restricted diffusion to suggest  acute infarction. No midline shift, mass effect, evidence of mass lesion, ventriculomegaly, extra-axial collection or acute intracranial hemorrhage. Cervicomedullary junction and pituitary are within normal limits. Patchy periventricular white matter T2 and FLAIR hyperintensity. Moderate T2 heterogeneity throughout the bilateral basal ganglia and thalami, but at least in part related to perivascular spaces. Small chronic lacunar infarct in the left cerebellum (series 7, image 5). Minimal T2 heterogeneity in the left pons. No cortical encephalomalacia identified. There are occasional chronic micro hemorrhages in the brain, including the left occipital lobe (series 10, image 7). Vascular: Major intracranial vascular flow voids are preserved. Persistent right trigeminal artery, better demonstrated on today MRA. Skull and upper cervical spine: Negative. Sinuses/Orbits: Postoperative changes to both globes, otherwise negative. Other: Visible internal auditory structures appear normal. Mild left mastoid effusion is stable since 2015. Negative nasopharynx. Negative scalp soft tissues. IMPRESSION: 1. No acute intracranial abnormality. 2. Moderate for age signal changes, most pronounced in the cerebral white matter and deep gray matter nuclei, most compatible with chronic small vessel disease. Electronically Signed   By: Genevie Ann M.D.   On: 04/09/2016 09:06   US Carotid Bilateral (at [facility] And [facility] Only)  Result Date: 04/09/2016 CLINICAL DATA:  Left lower extremity weakness. EXAM: BILATERAL CAROTID DUPLEX ULTRASOUND TECHNIQUE: Pearline Cables scale imaging, color Doppler and duplex ultrasound were performed of bilateral carotid and vertebral arteries in the neck. COMPARISON:  CT 04/09/2016 . FINDINGS: Criteria: Quantification of carotid stenosis is based on velocity parameters that correlate the residual internal carotid diameter with NASCET-based stenosis levels, using the diameter of the distal internal carotid lumen as the  denominator for stenosis measurement. The following velocity measurements were obtained: RIGHT ICA:  65/14 cm/sec CCA:  123456 cm/sec SYSTOLIC ICA/CCA RATIO:  0.7 DIASTOLIC ICA/CCA RATIO:  1.4 ECA:  141 cm/sec LEFT ICA:  54/8 cm/sec CCA:  [id number] cm/sec SYSTOLIC ICA/CCA RATIO:  0.7 DIASTOLIC ICA/CCA RATIO:  0.7 ECA:  126 cm/sec RIGHT CAROTID ARTERY: Mild right carotid bifurcation /proximal ICA atherosclerotic vascular disease. No flow limiting stenosis. RIGHT VERTEBRAL ARTERY:  Patent with antegrade flow. LEFT CAROTID ARTERY: Mild left distal common carotid/carotid bifurcation atherosclerotic-disease. No flow limiting stenosis . LEFT VERTEBRAL ARTERY:  Patent with antegrade flow. IMPRESSION: 1. Mild right carotid bifurcation/ proximal ICA atherosclerotic vascular disease. No flow limiting stenosis. Degree of stenosis less than 50%. 2. Mild left distal common carotid/ carotid bifurcation atherosclerotic vascular disease. No flow limiting stenosis. Degree of stenosis less than 50%. 3.  Vertebral arteries are patent antegrade flow. Electronically Signed   By: Marcello Moores  Register   On: 04/09/2016 08:57   Mr Jodene Nam Head/brain Wo Cm  Result Date: 04/09/2016 CLINICAL DATA:  76 year old female with altered mental status. Weakness and recent fall. Initial encounter. EXAM: MRA HEAD WITHOUT CONTRAST TECHNIQUE: Angiographic images of the Circle of Willis were obtained using MRA technique without intravenous contrast. COMPARISON:  Brain MRI from today reported separately. Carotid Doppler ultrasound from today. FINDINGS: Antegrade flow in the posterior circulation. Mildly dominant distal left vertebral artery. No distal vertebral stenosis. The right PICA origin is normal. Patent vertebrobasilar junction. Persistent right side trigeminal artery (normal anatomic variant). SCA and PCA origins are normal. Posterior communicating arteries are diminutive or absent. Bilateral PCA branches are within normal limits. Antegrade flow in both ICA  siphons. Mild siphon irregularity greater on the left with no associated siphon stenosis. Normal ophthalmic artery  origins. Normal carotid termini, MCA and ACA origins. Diminutive or absent anterior communicating artery. Visualized ACA branches are normal. MCA M1 segments, bifurcations, and visualized bilateral MCA branches are within normal limits. IMPRESSION: 1.  Negative intracranial MRA. 2. Persistent right trigeminal artery, an unusual normal anatomic variation. Electronically Signed   By: Genevie Ann M.D.   On: 04/09/2016 09:04    Assessment/Plan:   #1-history Leonette Monarch has done well with physical therapy she will need continued PT and OT-she has complained of some left hand pain we'll try to get an x-ray here before discharge also will increase the frequency of the tramadol.  #2 history of acute on chronic systolic and diastolic CHF-her weight and edema appears to be relatively stable torsemide has been reduced secondary to renal issues with elevated BUN-updated BMP is pending for tomorrow will await those results-.  #3 chronic kidney disease again torsemide has been reduced secondary to BUN of 62 creatinine actually appears improved at 1.68 update BMP is pending for tomorrow.  #4 history of atrial fibrillation INR again is supratherapeutic but is trending down this will be updated tomorrow vomiting continues to be on hold she is on amiodarone for rate control.  #5 status post Maze procedure with bioprosthetic mitral valve-this appears to be stable during her stay here.  #6 history of pneumonia this was treated with Augmentin this appears to have stabilized she is on chronic oxygen.  #7 history of gout she is on colchicine this is not really been an issue during her stay here.  #8 history of hypothyroidism she is on Synthroid TSH and T4 were mildly elevated on previous lab to update this tomorrow with follow up by primary care provider.  #9 history of urinary spasms she is on Tulterodine  --she has not really complain of this during her stay here to my knowledge.  #10 history of anxiety she continues on Xanax when necessary.  #11 history of anemia hemoglobin has dropped somewhat at 8.6 she is on iron updated CBC has been ordered for tomorrow occult blood testing is pending  this will need follow up  by primary care provider  if she is discharged---clinically she appears to be stable.  #12 history of hyperlipidemia she is on simvastatin will have follow-up by primary care provider since her stay here was relatively short.     Patient is being discharged with the following home health services:  PT and OT for strengthening as well as RN support for multiple medical issues as noted above  Patient is being discharged with the following durable medical equipment:  She has all her DME including oxygen at home  Patient has been advised to f/u with their PCP in 1-2 weeks to bring them up to date on their rehab stay.  Social services at facility was responsible for arranging this appointment.  Pt was provided with a 30 day supply of prescriptions for medications and refills must be obtained from their PCP.  For controlled substances, a more limited supply may be provided adequate until PCP appointment only.  Future labs/tests needed:   Will have CBC-metabolic panel-TSH drawn tomorrow as well as a PT/INR.  Also will need INR drawn by home health when she is discharged.  Also will try to obtain an x-ray of her left hand before discharge.  [id number] note greater than 30 minutes spent on this discharge summary-greater than 50% of time spent coordinating plan of care for numerous diagnoses

## 2016-04-29 ENCOUNTER — Encounter (HOSPITAL_COMMUNITY)
Admission: RE | Admit: 2016-04-29 | Discharge: 2016-04-29 | Disposition: A | Discharge status: Home / Self Care | Payer: Medicare HMO | Source: Skilled Nursing Facility | Attending: Internal Medicine | Admitting: Internal Medicine

## 2016-04-29 ENCOUNTER — Inpatient Hospital Stay (HOSPITAL_COMMUNITY)
Admission: EM | Admit: 2016-04-29 | Discharge: 2016-05-04 | DRG: 378 | Disposition: A | Discharge status: Home Health | Payer: Medicare HMO | Attending: Internal Medicine | Admitting: Internal Medicine

## 2016-04-29 ENCOUNTER — Encounter (HOSPITAL_COMMUNITY): Payer: Self-pay | Admitting: *Deleted

## 2016-04-29 DIAGNOSIS — D123 Benign neoplasm of transverse colon: Secondary | ICD-10-CM | POA: Diagnosis not present

## 2016-04-29 DIAGNOSIS — I480 Paroxysmal atrial fibrillation: Secondary | ICD-10-CM | POA: Diagnosis not present

## 2016-04-29 DIAGNOSIS — E785 Hyperlipidemia, unspecified: Secondary | ICD-10-CM | POA: Diagnosis present

## 2016-04-29 DIAGNOSIS — K5730 Diverticulosis of large intestine without perforation or abscess without bleeding: Secondary | ICD-10-CM | POA: Diagnosis present

## 2016-04-29 DIAGNOSIS — D631 Anemia in chronic kidney disease: Secondary | ICD-10-CM | POA: Diagnosis present

## 2016-04-29 DIAGNOSIS — I10 Essential (primary) hypertension: Secondary | ICD-10-CM | POA: Diagnosis present

## 2016-04-29 DIAGNOSIS — D120 Benign neoplasm of cecum: Secondary | ICD-10-CM | POA: Diagnosis not present

## 2016-04-29 DIAGNOSIS — E038 Other specified hypothyroidism: Secondary | ICD-10-CM

## 2016-04-29 DIAGNOSIS — Z953 Presence of xenogenic heart valve: Secondary | ICD-10-CM | POA: Diagnosis not present

## 2016-04-29 DIAGNOSIS — Z8673 Personal history of transient ischemic attack (TIA), and cerebral infarction without residual deficits: Secondary | ICD-10-CM | POA: Diagnosis not present

## 2016-04-29 DIAGNOSIS — Z7901 Long term (current) use of anticoagulants: Secondary | ICD-10-CM | POA: Diagnosis not present

## 2016-04-29 DIAGNOSIS — D61818 Other pancytopenia: Secondary | ICD-10-CM | POA: Diagnosis not present

## 2016-04-29 DIAGNOSIS — Z8541 Personal history of malignant neoplasm of cervix uteri: Secondary | ICD-10-CM | POA: Diagnosis not present

## 2016-04-29 DIAGNOSIS — N189 Chronic kidney disease, unspecified: Secondary | ICD-10-CM

## 2016-04-29 DIAGNOSIS — K922 Gastrointestinal hemorrhage, unspecified: Secondary | ICD-10-CM | POA: Diagnosis not present

## 2016-04-29 DIAGNOSIS — E039 Hypothyroidism, unspecified: Secondary | ICD-10-CM | POA: Diagnosis present

## 2016-04-29 DIAGNOSIS — I130 Hypertensive heart and chronic kidney disease with heart failure and stage 1 through stage 4 chronic kidney disease, or unspecified chronic kidney disease: Secondary | ICD-10-CM | POA: Diagnosis present

## 2016-04-29 DIAGNOSIS — K2970 Gastritis, unspecified, without bleeding: Secondary | ICD-10-CM | POA: Diagnosis present

## 2016-04-29 DIAGNOSIS — D124 Benign neoplasm of descending colon: Secondary | ICD-10-CM | POA: Diagnosis present

## 2016-04-29 DIAGNOSIS — Z90710 Acquired absence of both cervix and uterus: Secondary | ICD-10-CM | POA: Diagnosis not present

## 2016-04-29 DIAGNOSIS — I4891 Unspecified atrial fibrillation: Secondary | ICD-10-CM

## 2016-04-29 DIAGNOSIS — J9611 Chronic respiratory failure with hypoxia: Secondary | ICD-10-CM | POA: Diagnosis present

## 2016-04-29 DIAGNOSIS — I4821 Permanent atrial fibrillation: Secondary | ICD-10-CM | POA: Diagnosis present

## 2016-04-29 DIAGNOSIS — Z66 Do not resuscitate: Secondary | ICD-10-CM | POA: Diagnosis not present

## 2016-04-29 DIAGNOSIS — K921 Melena: Secondary | ICD-10-CM | POA: Diagnosis not present

## 2016-04-29 DIAGNOSIS — N183 Chronic kidney disease, stage 3 unspecified: Secondary | ICD-10-CM | POA: Diagnosis present

## 2016-04-29 DIAGNOSIS — Z9981 Dependence on supplemental oxygen: Secondary | ICD-10-CM | POA: Diagnosis not present

## 2016-04-29 DIAGNOSIS — Z96651 Presence of right artificial knee joint: Secondary | ICD-10-CM | POA: Diagnosis present

## 2016-04-29 DIAGNOSIS — Z8249 Family history of ischemic heart disease and other diseases of the circulatory system: Secondary | ICD-10-CM

## 2016-04-29 DIAGNOSIS — I5042 Chronic combined systolic (congestive) and diastolic (congestive) heart failure: Secondary | ICD-10-CM | POA: Diagnosis not present

## 2016-04-29 DIAGNOSIS — I272 Pulmonary hypertension, unspecified: Secondary | ICD-10-CM | POA: Diagnosis not present

## 2016-04-29 DIAGNOSIS — K529 Noninfective gastroenteritis and colitis, unspecified: Secondary | ICD-10-CM | POA: Diagnosis not present

## 2016-04-29 DIAGNOSIS — E784 Other hyperlipidemia: Secondary | ICD-10-CM | POA: Insufficient documentation

## 2016-04-29 DIAGNOSIS — K633 Ulcer of intestine: Secondary | ICD-10-CM | POA: Diagnosis present

## 2016-04-29 DIAGNOSIS — J449 Chronic obstructive pulmonary disease, unspecified: Secondary | ICD-10-CM | POA: Diagnosis present

## 2016-04-29 DIAGNOSIS — N3281 Overactive bladder: Secondary | ICD-10-CM | POA: Insufficient documentation

## 2016-04-29 DIAGNOSIS — D649 Anemia, unspecified: Secondary | ICD-10-CM

## 2016-04-29 DIAGNOSIS — M1990 Unspecified osteoarthritis, unspecified site: Secondary | ICD-10-CM | POA: Insufficient documentation

## 2016-04-29 DIAGNOSIS — K625 Hemorrhage of anus and rectum: Secondary | ICD-10-CM | POA: Diagnosis not present

## 2016-04-29 DIAGNOSIS — K51514 Left sided colitis with abscess: Secondary | ICD-10-CM | POA: Diagnosis not present

## 2016-04-29 DIAGNOSIS — Z5189 Encounter for other specified aftercare: Secondary | ICD-10-CM | POA: Insufficient documentation

## 2016-04-29 DIAGNOSIS — D539 Nutritional anemia, unspecified: Secondary | ICD-10-CM | POA: Insufficient documentation

## 2016-04-29 DIAGNOSIS — K219 Gastro-esophageal reflux disease without esophagitis: Secondary | ICD-10-CM | POA: Insufficient documentation

## 2016-04-29 LAB — CBC
HCT: 23.2 % — ABNORMAL LOW (ref 36.0–46.0)
HEMOGLOBIN: 7.5 g/dL — AB (ref 12.0–15.0)
MCH: 32.2 pg (ref 26.0–34.0)
MCHC: 32.3 g/dL (ref 30.0–36.0)
MCV: 99.6 fL (ref 78.0–100.0)
Platelets: 119 10*3/uL — ABNORMAL LOW (ref 150–400)
RBC: 2.33 MIL/uL — AB (ref 3.87–5.11)
RDW: 16.9 % — ABNORMAL HIGH (ref 11.5–15.5)
WBC: 3.6 10*3/uL — ABNORMAL LOW (ref 4.0–10.5)

## 2016-04-29 LAB — COMPREHENSIVE METABOLIC PANEL
ALBUMIN: 2.7 g/dL — AB (ref 3.5–5.0)
ALT: 10 U/L — ABNORMAL LOW (ref 14–54)
ANION GAP: 9 (ref 5–15)
AST: 24 U/L (ref 15–41)
Alkaline Phosphatase: 92 U/L (ref 38–126)
BUN: 61 mg/dL — ABNORMAL HIGH (ref 6–20)
CO2: 33 mmol/L — ABNORMAL HIGH (ref 22–32)
Calcium: 8.7 mg/dL — ABNORMAL LOW (ref 8.9–10.3)
Chloride: 95 mmol/L — ABNORMAL LOW (ref 101–111)
Creatinine, Ser: 1.82 mg/dL — ABNORMAL HIGH (ref 0.44–1.00)
GFR calc Af Amer: 30 mL/min — ABNORMAL LOW (ref >60)
GFR calc non Af Amer: 26 mL/min — ABNORMAL LOW (ref >60)
GLUCOSE: 92 mg/dL (ref 65–99)
POTASSIUM: 3.7 mmol/L (ref 3.5–5.1)
SODIUM: 137 mmol/L (ref 135–145)
Total Bilirubin: 1 mg/dL (ref 0.3–1.2)
Total Protein: 5.8 g/dL — ABNORMAL LOW (ref 6.5–8.1)

## 2016-04-29 LAB — TYPE AND SCREEN
ABO/RH(D): B POS
Antibody Screen: NEGATIVE

## 2016-04-29 LAB — PROTIME-INR
INR: 2.42
PROTHROMBIN TIME: 26.8 s — AB (ref 11.4–15.2)

## 2016-04-29 LAB — TSH: TSH: 7.383 u[IU]/mL — ABNORMAL HIGH (ref 0.350–4.500)

## 2016-04-29 MED ORDER — ACETAMINOPHEN 650 MG RE SUPP: 650.0000 mg | Freq: Four times a day (QID) | RECTAL | Status: DC | PRN

## 2016-04-29 MED ORDER — ACETAMINOPHEN 325 MG PO TABS
650.0000 mg | ORAL_TABLET | Freq: Four times a day (QID) | ORAL | Status: DC | PRN
  Administered 2016-04-29 – 2016-05-04 (×10): 650 mg via ORAL
  Filled 2016-04-29 (×10): qty 2

## 2016-04-29 MED ORDER — ONDANSETRON HCL 4 MG PO TABS: 4.0000 mg | ORAL_TABLET | Freq: Four times a day (QID) | ORAL | Status: DC | PRN

## 2016-04-29 MED ORDER — LEVOTHYROXINE SODIUM 50 MCG PO TABS
50.0000 ug | ORAL_TABLET | Freq: Every day | ORAL | Status: DC
  Administered 2016-04-30 – 2016-05-04 (×5): 50 ug via ORAL
  Filled 2016-04-29 (×5): qty 1

## 2016-04-29 MED ORDER — POTASSIUM CHLORIDE CRYS ER 20 MEQ PO TBCR
20.0000 meq | EXTENDED_RELEASE_TABLET | Freq: Every day | ORAL | Status: DC
  Administered 2016-04-29 – 2016-05-03 (×5): 20 meq via ORAL
  Filled 2016-04-29 (×5): qty 1

## 2016-04-29 MED ORDER — PANTOPRAZOLE SODIUM 40 MG IV SOLR
40.0000 mg | Freq: Once | INTRAVENOUS | Status: AC
  Administered 2016-04-29: 40 mg via INTRAVENOUS
  Filled 2016-04-29: qty 40

## 2016-04-29 MED ORDER — FESOTERODINE FUMARATE ER 4 MG PO TB24
8.0000 mg | ORAL_TABLET | Freq: Every day | ORAL | Status: DC
  Administered 2016-04-29 – 2016-05-03 (×3): 8 mg via ORAL
  Filled 2016-04-29 (×7): qty 2

## 2016-04-29 MED ORDER — ACETAMINOPHEN 500 MG PO TABS
1000.0000 mg | ORAL_TABLET | Freq: Once | ORAL | Status: AC
  Administered 2016-04-29: 1000 mg via ORAL
  Filled 2016-04-29: qty 2

## 2016-04-29 MED ORDER — PANTOPRAZOLE SODIUM 40 MG PO TBEC
40.0000 mg | DELAYED_RELEASE_TABLET | Freq: Two times a day (BID) | ORAL | Status: DC
  Administered 2016-04-29 – 2016-04-30 (×2): 40 mg via ORAL
  Filled 2016-04-29 (×2): qty 1

## 2016-04-29 MED ORDER — ONDANSETRON HCL 4 MG/2ML IJ SOLN
4.0000 mg | Freq: Four times a day (QID) | INTRAMUSCULAR | Status: DC | PRN
  Administered 2016-04-30: 4 mg via INTRAVENOUS
  Filled 2016-04-29: qty 2

## 2016-04-29 MED ORDER — SIMVASTATIN 10 MG PO TABS
10.0000 mg | ORAL_TABLET | Freq: Every evening | ORAL | Status: DC
  Administered 2016-04-29 – 2016-05-03 (×5): 10 mg via ORAL
  Filled 2016-04-29 (×5): qty 1

## 2016-04-29 MED ORDER — TORSEMIDE 20 MG PO TABS
50.0000 mg | ORAL_TABLET | Freq: Every day | ORAL | Status: DC
  Administered 2016-04-29 – 2016-05-03 (×5): 50 mg via ORAL
  Filled 2016-04-29 (×5): qty 3

## 2016-04-29 MED ORDER — COLCHICINE 0.6 MG PO TABS
0.3000 mg | ORAL_TABLET | Freq: Every day | ORAL | Status: DC
  Administered 2016-04-29 – 2016-05-03 (×5): 0.3 mg via ORAL
  Filled 2016-04-29 (×5): qty 1

## 2016-04-29 MED ORDER — TRAMADOL HCL 50 MG PO TABS
25.0000 mg | ORAL_TABLET | ORAL | Status: DC | PRN
  Administered 2016-04-30 – 2016-05-03 (×4): 25 mg via ORAL
  Filled 2016-04-29 (×4): qty 1

## 2016-04-29 MED ORDER — AMIODARONE HCL 200 MG PO TABS
100.0000 mg | ORAL_TABLET | Freq: Every day | ORAL | Status: DC
  Administered 2016-04-29 – 2016-05-03 (×5): 100 mg via ORAL
  Filled 2016-04-29 (×5): qty 1

## 2016-04-29 NOTE — ED Triage Notes (Signed)
Pt brought in from [facility] with c/o low hemoglobin this morning, hemoglobin was found to be 7.5. Pt was supposed to be discharged today back home with her daughter. Pt was in [facility] for rehab. Pt denies weakness, dizziness, blood in stool, vomiting blood. Pt unsure why her hemoglobin was checked today.

## 2016-04-29 NOTE — ED Provider Notes (Signed)
[place] DEPT Provider Note   CSN: [id number] Arrival date & time: 04/29/16  1322  By signing my name below, I, [name], attest that this documentation has been prepared under the direction and in the presence of Veryl Speak, MD. Electronically Signed: [name], Scribe. 04/29/16. 1:36 PM.   History   Chief Complaint Chief Complaint  Patient presents with  . Abnormal Lab    HPI Comments: [name] [name] is a 76 y.o. female with a hx of CHF, CKD, COPD, PAF on coumadin, stroke, and valve replacement, who presents to the Emergency Department complaining of an abnormal lab. Patient brought in from [facility] with a hemoglobin of 7.5. Patient was supposed to be discharged home today. Patient states she is unsure why she is in the ED. Patient is not sure why her hemoglobin was checked today. Patient has never had a colonoscopy in the past. Patient states she was in the rehab center due to having left leg weakness, in which she fell. Patient admitted for a stroke. Patient is on coumadin. Patient denies any shortness of breath, prior anemia problems, hematochezia, melena, weakness, dizziness,  or abdominal pain.   The history is provided by the patient. No language interpreter was used.    Past Medical History:  Diagnosis Date  . Aortic insufficiency due to bicuspid aortic valve    Moderate by TEE  . Arthritis   . Chronic diastolic congestive heart failure ([place])    a) ECHO (09/2013) EF 50-55%  . Chronic respiratory failure with hypoxia (HCC) 04/10/2016   On 2 L nasal cannula oxygen chronically.  . CKD (chronic kidney disease) stage 4, GFR 15-29 ml/min (HCC)   . COPD (chronic obstructive pulmonary disease) ([place])   . Essential hypertension, benign   . History of stroke    2000 [place]  . History of TIA (transient ischemic attack)    Diagnosed 2003  . Hyperlipidemia   . Juvenile rheumatic fever    Age 34  . Mitral regurgitation    Status post bioprosthetic MVR (05/2013)   . NICM (nonischemic cardiomyopathy) ([place])    a) LHC (04/2013) no significant CAD  . Obesity   . Paroxysmal atrial fibrillation (HCC)    s/p MAZE (05/2013)  . Pulmonary hypertension 04/10/2016   Peak pressure 66 mmHg, Echo 04/09/2016  . S/P Maze operation for atrial fibrillation 05/16/2013   Complete bilateral atrial lesion set using cryothermy and bipolar radiofrequency ablation with oversewing of LA appendage  . 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 Systolic dysfunction without heart failure 04/10/2016   EF 35-40%, ECHO 04/09/2016    Patient Active Problem List   Diagnosis Date Noted  . Pulmonary hypertension 04/10/2016  . TIA (transient ischemic attack) 04/09/2016  . Thrombocytopenia ([place]) 04/09/2016  . Physical deconditioning 04/09/2016  . Transient weakness of left lower extremity   . CKD (chronic kidney disease) stage 3, GFR 30-59 ml/min 06/25/2015  . CAP (community acquired pneumonia) 11/02/2014  . Anemia, iron deficiency 05/09/2014  . UTI (urinary tract infection) 05/09/2014  . Obesity 05/08/2014  . Hypothyroidism 05/08/2014  . Fx lumbar vertebra-closed ([place]) 11/12/2013  . Lumbar vertebral fracture ([place]) 11/11/2013  . DNR (do not resuscitate) 10/23/2013  . Anemia in chronic kidney disease 10/20/2013  . Acute on chronic diastolic heart failure ([place]) 10/14/2013  . Long term current use of anticoagulant therapy 06/22/2013  .  Chronic systolic heart failure ([place]) 06/18/2013  . CVA (cerebral infarction) 05/21/2013  . Acute pulmonary edema ([place]) 05/18/2013  . 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 S/P Maze operation for atrial fibrillation 05/16/2013  . Aortic insufficiency due to bicuspid aortic valve   . Paroxysmal atrial fibrillation (HCC)   . Arthritis   .  Acute respiratory failure ([place]) 05/02/2013  . Juvenile rheumatic fever   . Mitral regurgitation 02/16/2013  . HTN (hypertension) 02/15/2011    Past Surgical History:  Procedure Laterality Date  . ABDOMINAL HYSTERECTOMY     Cervical Cancer  . CARDIOVERSION N/A 06/06/2013   Procedure: CARDIOVERSION;  Surgeon: Larey Dresser, MD;  Location: [place];  Service: Cardiovascular;  Laterality: N/A;  . INTRAOPERATIVE TRANSESOPHAGEAL ECHOCARDIOGRAM N/A 05/16/2013   Procedure: INTRAOPERATIVE TRANSESOPHAGEAL ECHOCARDIOGRAM;  Surgeon: Rexene Alberts, MD;  Location: [place];  Service: Open Heart Surgery;  Laterality: N/A;  . LEFT AND RIGHT HEART CATHETERIZATION WITH CORONARY ANGIOGRAM N/A 05/04/2013   Procedure: LEFT AND RIGHT HEART CATHETERIZATION WITH CORONARY ANGIOGRAM;  Surgeon: Jettie Booze, MD;  Location: [facility] CATH LAB;  Service: Cardiovascular;  Laterality: N/A;  . MINIMALLY INVASIVE MAZE PROCEDURE N/A 05/16/2013   Procedure: MINIMALLY INVASIVE MAZE PROCEDURE;  Surgeon: Rexene Alberts, MD;  Location: [place];  Service: Open Heart Surgery;  Laterality: N/A;  . MITRAL VALVE REPLACEMENT Right 05/16/2013   Procedure: MINIMALLY INVASIVE MITRAL VALVE (MV) REPLACEMENT;  Surgeon: Rexene Alberts, MD;  Location: [place];  Service: Open Heart Surgery;  Laterality: Right;  . Parathyroid/Thyroid surgery     Tumor  . TEE WITHOUT CARDIOVERSION N/A 04/06/2013   Procedure: TRANSESOPHAGEAL ECHOCARDIOGRAM (TEE);  Surgeon: [name], MD;  [facility];  Service: Cardiology;  Laterality: N/A;  . TEE WITHOUT CARDIOVERSION N/A 06/06/2013   Procedure: TRANSESOPHAGEAL ECHOCARDIOGRAM (TEE);  Surgeon: Larey Dresser, MD;  Location: [place];  Service: Cardiovascular;  Laterality: N/A;  . TOTAL KNEE ARTHROPLASTY Right     OB History    No data available       Home Medications    Prior to Admission medications   Medication Sig Start Date End Date Taking? Authorizing Provider  acetaminophen  (TYLENOL) 325 MG tablet Take 650 mg by mouth every 4 (four) hours as needed.    Historical Provider, MD  amiodarone (PACERONE) 200 MG tablet Take 0.5 tablets (100 mg total) by mouth daily. 10/10/14   [name], MD  colchicine 0.6 MG tablet Take 0.5 tablets (0.3 mg total) by mouth daily. 10/11/13   Rande Brunt, NP  ferrous sulfate 325 (65 FE) MG tablet Take 325 mg by mouth 2 (two) times daily with a meal.    Historical Provider, MD  furosemide (LASIX) 20 MG tablet Take 20 mg by mouth every other day.    Historical Provider, MD  levothyroxine (SYNTHROID, LEVOTHROID) 50 MCG tablet Take 50 mcg by mouth daily before breakfast.    Historical Provider, MD  omeprazole (PRILOSEC) 20 MG capsule Take 20 mg by mouth daily.  04/05/16   Historical Provider, MD  potassium chloride SA (K-DUR,KLOR-CON) 20 MEQ tablet Take 20 mEq by mouth daily.    Historical Provider, MD  simvastatin (ZOCOR) 10 MG tablet TAKE ONE TABLET BY MOUTH EVERY EVENING. 12/10/13   Tiffany L Reed, DO  tolterodine (DETROL LA) 4 MG 24 hr capsule Take 4 mg by mouth daily.    Historical Provider, MD  torsemide [facility])  100 MG tablet Take 50 mg by mouth daily.    Historical Provider, MD  traMADol (ULTRAM) 50 MG tablet Take 25 mg by mouth every 6 (six) hours as needed.     Historical Provider, MD    Family History Family History  Problem Relation Age of Onset  . Heart failure Father   . Heart attack Brother     Social History Social History  Substance Use Topics  . Smoking status: Never Smoker  . Smokeless tobacco: Never Used  . Alcohol use No     Allergies   Indomethacin and Norvasc [amlodipine besylate]   Review of Systems Review of Systems  All other systems reviewed and are negative.    Physical Exam Updated Vital Signs BP (!) 122/51 (BP Location: Left Arm)   Pulse 60   Temp 97.9 F (36.6 C) (Oral)   Resp 18   SpO2 97%   Physical Exam  Constitutional: She is oriented to person, place, and time. She appears  well-developed.  HENT:  Head: Normocephalic and atraumatic.  Mouth/Throat: Oropharynx is clear and moist.  Eyes: Conjunctivae and EOM are normal. Pupils are equal, round, and reactive to light.  Neck: Normal range of motion. Neck supple.  Cardiovascular: Normal rate.   Pulmonary/Chest: Effort normal.  Abdominal: Soft. Bowel sounds are normal.  Genitourinary:  Genitourinary Comments: Patient has black stool per rectum. Guaiac positive.   Musculoskeletal: Normal range of motion.  Neurological: She is alert and oriented to person, place, and time.  Skin: Skin is warm and dry.  Psychiatric: She has a normal mood and affect.     ED Treatments / Results  DIAGNOSTIC STUDIES: Oxygen Saturation is 97% on RA, adequate by my interpretation.    COORDINATION OF CARE: 1:35 PM Discussed treatment plan with pt at bedside and pt agreed to plan, which includes a hemoccult test.   Labs (all labs ordered are listed, but only abnormal results are displayed) Labs Reviewed - No data to display  EKG  EKG Interpretation None       Radiology No results found.  Procedures Procedures (including critical care time)  Medications Ordered in ED Medications - No data to display   Initial Impression / Assessment and Plan / ED Course  I have reviewed the triage vital signs and the nursing notes.  Pertinent labs & imaging results that were available during my care of the patient were reviewed by me and considered in my medical decision making (see chart for details).  Patient sent from rehabilitation facility for hemoglobin of 7.5. She has melanotic, heme positive stool and INR of 2.4. I discussed this with Dr. Buford Dresser who is recommending admission. Dr. Roderic Palau agrees to admit.  Final Clinical Impressions(s) / ED Diagnoses   Final diagnoses:  None    New Prescriptions New Prescriptions   No medications on file   I personally performed the services described in this documentation, which was  scribed in my presence. The recorded information has been reviewed and is accurate.        Veryl Speak, MD 04/29/16 [phone]

## 2016-04-29 NOTE — Consult Note (Signed)
Referring Provider: [facility] Primary Care Physician:  [name], MD Primary Gastroenterologist:  Dr. Gala Romney (previously unassigned)  Date of Admission: 04/29/16 Date of Consultation: 04/29/16  Reason for Consultation:  Melena, heme+ stool on rectal exam, anemia  HPI:  [name] [name] is a 76 y.o. female with a past medical history of CHF, CKD, COPD, PAF on coumadin anticoagulation, CVA, valve replacement. She presented to the ER from [facility] for hgb 7.5. She is at [facility] s/p fall with left leg weakness. Never had a colonoscopy before. No previous issues with anemia. Denies hematochezia and melena, abdominal pain to the ER provider. On rectal exam there was noted melena, heme+. She is on iron supplementation and prilosed 20 mg daily. CBC showed hgb 7.5 (her baseline is 10-12, trending down since 04/09/16 from 11.4 -> 10.4 -> 9.9 -> 8.6 -> 7.5 on admission). INR was supratheraputic 3 days ago (4.91 and 4.55) but down to 3.20 yesterday and 2.42 today. CMP with Cr 1.82 (within baseline), normal LFTs.   Today she states she has noted intermittent dark stools, but is on po iron. Denies chest pain, abdominal pain, GERD symptoms, N/V. No hematochezia. Has never had a colonoscopy "and I aint never going to have one." States she wants to go home, was d/c from [facility] today and is anxious to be home. Denies any other upper or lower GI symptoms.  Past Medical History:  Diagnosis Date  . Aortic insufficiency due to bicuspid aortic valve    Moderate by TEE  . Arthritis   . Chronic diastolic congestive heart failure ([place])    a) ECHO (09/2013) EF 50-55%  . Chronic respiratory failure with hypoxia (HCC) 04/10/2016   On 2 L nasal cannula oxygen chronically.  . CKD (chronic kidney disease) stage 4, GFR 15-29 ml/min (HCC)   . COPD (chronic obstructive pulmonary disease) ([place])   . Essential hypertension, benign   . History of stroke    2000 [place]  . History of TIA (transient ischemic  attack)    Diagnosed 2003  . Hyperlipidemia   . Juvenile rheumatic fever    Age 83  . 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 NICM (nonischemic cardiomyopathy) ([place])    a) LHC (04/2013) no significant CAD  . Obesity   . Paroxysmal atrial fibrillation (HCC)    s/p MAZE (05/2013)  . Pulmonary hypertension 04/10/2016   Peak pressure 66 mmHg, Echo 04/09/2016  . S/P Maze operation for atrial fibrillation 05/16/2013   Complete bilateral atrial lesion set using cryothermy and bipolar radiofrequency ablation with oversewing of LA appendage  . 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 Systolic dysfunction without heart failure 04/10/2016   EF 35-40%, ECHO 04/09/2016    Past Surgical History:  Procedure Laterality Date  . ABDOMINAL HYSTERECTOMY     Cervical Cancer  . CARDIOVERSION N/A 06/06/2013   Procedure: CARDIOVERSION;  Surgeon: Larey Dresser, MD;  Location: [place];  Service: Cardiovascular;  Laterality: N/A;  . INTRAOPERATIVE TRANSESOPHAGEAL ECHOCARDIOGRAM N/A 05/16/2013   Procedure: INTRAOPERATIVE TRANSESOPHAGEAL ECHOCARDIOGRAM;  Surgeon: Rexene Alberts, MD;  Location: [place];  Service: Open Heart Surgery;  Laterality: N/A;  . LEFT AND RIGHT HEART CATHETERIZATION WITH CORONARY ANGIOGRAM N/A 05/04/2013   Procedure: LEFT AND RIGHT HEART CATHETERIZATION WITH CORONARY ANGIOGRAM;  Surgeon: Jettie Booze, MD;  Location:  [place] CATH LAB;  Service: Cardiovascular;  Laterality: N/A;  . MINIMALLY INVASIVE MAZE PROCEDURE N/A 05/16/2013   Procedure: MINIMALLY INVASIVE MAZE PROCEDURE;  Surgeon: Rexene Alberts, MD;  Location: [place];  Service: Open Heart Surgery;  Laterality: N/A;  . MITRAL VALVE REPLACEMENT Right 05/16/2013   Procedure: MINIMALLY INVASIVE MITRAL VALVE (MV) REPLACEMENT;  Surgeon: Rexene Alberts, MD;  Location:  [place];  Service: Open Heart Surgery;  Laterality: Right;  . Parathyroid/Thyroid surgery     Tumor  . TEE WITHOUT CARDIOVERSION N/A 04/06/2013   Procedure: TRANSESOPHAGEAL ECHOCARDIOGRAM (TEE);  Surgeon: [name], MD;  [facility];  Service: Cardiology;  Laterality: N/A;  . TEE WITHOUT CARDIOVERSION N/A 06/06/2013   Procedure: TRANSESOPHAGEAL ECHOCARDIOGRAM (TEE);  Surgeon: Larey Dresser, MD;  Location: [place];  Service: Cardiovascular;  Laterality: N/A;  . TOTAL KNEE ARTHROPLASTY Right     Prior to Admission medications   Medication Sig Start Date End Date Taking? Authorizing Provider  acetaminophen (TYLENOL) 325 MG tablet Take 650 mg by mouth every 4 (four) hours as needed.    Historical Provider, MD  amiodarone (PACERONE) 200 MG tablet Take 0.5 tablets (100 mg total) by mouth daily. 10/10/14   [name], MD  colchicine 0.6 MG tablet Take 0.5 tablets (0.3 mg total) by mouth daily. 10/11/13   Rande Brunt, NP  ferrous sulfate 325 (65 FE) MG tablet Take 325 mg by mouth 2 (two) times daily with a meal.    Historical Provider, MD  furosemide (LASIX) 20 MG tablet Take 20 mg by mouth every other day.    Historical Provider, MD  levothyroxine (SYNTHROID, LEVOTHROID) 50 MCG tablet Take 50 mcg by mouth daily before breakfast.    Historical Provider, MD  omeprazole (PRILOSEC) 20 MG capsule Take 20 mg by mouth daily.  04/05/16   Historical Provider, MD  potassium chloride SA (K-DUR,KLOR-CON) 20 MEQ tablet Take 20 mEq by mouth daily.    Historical Provider, MD  simvastatin (ZOCOR) 10 MG tablet TAKE ONE TABLET BY MOUTH EVERY EVENING. 12/10/13   Tiffany L Reed, DO  tolterodine (DETROL LA) 4 MG 24 hr capsule Take 4 mg by mouth daily.    Historical Provider, MD  torsemide (DEMADEX) 100 MG tablet Take 50 mg by mouth daily.    Historical Provider, MD  traMADol (ULTRAM) 50 MG tablet Take 25 mg by mouth every 6 (six) hours as needed.     Historical Provider, MD    No current  facility-administered medications for this encounter.    Current Outpatient Prescriptions  Medication Sig Dispense Refill  . acetaminophen (TYLENOL) 325 MG tablet Take 650 mg by mouth every 4 (four) hours as needed.    Marland Kitchen amiodarone (PACERONE) 200 MG tablet Take 0.5 tablets (100 mg total) by mouth daily. 30 tablet 6  . colchicine 0.6 MG tablet Take 0.5 tablets (0.3 mg total) by mouth daily. 30 tablet 0  . ferrous sulfate 325 (65 FE) MG tablet Take 325 mg by mouth 2 (two) times daily with a meal.    . furosemide (LASIX) 20 MG tablet Take 20 mg by mouth every other day.    . levothyroxine (SYNTHROID, LEVOTHROID) 50 MCG tablet Take 50 mcg by mouth daily before breakfast.    . omeprazole (PRILOSEC) 20 MG capsule Take 20 mg by mouth daily.     . potassium chloride SA (K-DUR,KLOR-CON) 20 MEQ tablet Take 20 mEq by mouth daily.    . simvastatin (ZOCOR) 10  MG tablet TAKE ONE TABLET BY MOUTH EVERY EVENING. 30 tablet 3  . tolterodine (DETROL LA) 4 MG 24 hr capsule Take 4 mg by mouth daily.    Marland Kitchen torsemide (DEMADEX) 100 MG tablet Take 50 mg by mouth daily.    . traMADol (ULTRAM) 50 MG tablet Take 25 mg by mouth every 6 (six) hours as needed.       Allergies as of 04/29/2016 - Review Complete 04/29/2016  Allergen Reaction Noted  . Indomethacin Other (See Comments) 02/15/2011  . Norvasc [amlodipine besylate] Cough 02/15/2011    Family History  Problem Relation Age of Onset  . Heart failure Father   . Heart attack Brother     Social History   Social History  . Marital status: Widowed    Spouse name: N/A  . Number of children: N/A  . Years of education: N/A   Occupational History  . Not on file.   Social History Main Topics  . Smoking status: Never Smoker  . Smokeless tobacco: Never Used  . Alcohol use No  . Drug use: No  . Sexual activity: Not on file   Other Topics Concern  . Not on file   Social History Narrative   Lives in 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 ENT: Negative for hoarseness, difficulty swallowing. CV: Negative for chest pain, angina, palpitations, peripheral edema.  Respiratory: Negative for dyspnea at rest, cough, sputum, wheezing.  GI: See history of present illness. Derm: Negative for rash or itching.  Neuro: Negative for memory loss, confusion.  Endo: Negative for unusual weight change.  Heme: Negative for bruising or bleeding.  Physical Exam: Vital signs in last 24 hours: Temp:  [97.9 F (36.6 C)] 97.9 F (36.6 C) (02/22 1324) Pulse Rate:  [60] 60 (02/22 1324) Resp:  [18] 18 (02/22 1324) BP: (122)/(51) 122/51 (02/22 1324) SpO2:  [97 %] 97 % (02/22 1324) Weight:  [168 lb (76.2 kg)] 168 lb (76.2 kg) (02/22 1325)   General:   Alert,  Well-developed, well-nourished, pleasant and cooperative in NAD Head:  Normocephalic and atraumatic. Eyes:  Sclera clear, no icterus. Conjunctiva pink. Ears:  Normal auditory acuity. Neck:  Supple; no masses or thyromegaly. Lungs:  Clear throughout to auscultation.   No wheezes, crackles, or rhonchi. No acute distress. Heart:  Regular rate and rhythm; no murmurs, clicks, rubs,  or gallops. Abdomen:  Soft, nontender and nondistended. No masses, hepatosplenomegaly or hernias noted. Normal bowel sounds, without guarding, and without rebound.   Rectal:  Deferred.   Msk:  Symmetrical without gross deformities. Pulses:  Normal pulses noted. Extremities:  Without clubbing or edema. Neurologic:  Alert and  oriented x4;  grossly normal neurologically. Psych:  Alert and cooperative. Normal mood and affect.  Intake/Output from previous day: No intake/output data recorded. Intake/Output this shift: No intake/output data recorded.  Lab Results:  Recent Labs  04/29/16 0700  WBC 3.6*  HGB 7.5*  HCT 23.2*  PLT 119*   BMET  Recent Labs  04/29/16 0700   NA 137  K 3.7  CL 95*  CO2 33*  GLUCOSE 92  BUN 61*  CREATININE 1.82*  CALCIUM 8.7*   LFT  Recent Labs  04/29/16 0700  PROT 5.8*  ALBUMIN 2.7*  AST 24  ALT 10*  ALKPHOS 92  BILITOT 1.0  PT/INR  Recent Labs  04/28/16 1300 04/29/16 0700  LABPROT 33.5* 26.8*  INR 3.20 2.42   Hepatitis Panel No results for input(s): HEPBSAG, HCVAB, HEPAIGM, HEPBIGM in the last 72 hours. C-Diff No results for input(s): CDIFFTOX in the last 72 hours.  Studies/Results: No results found.  Impression: Pleasant 76 year old female with a history of CVA and atrial fibrillation on chronic anticoagulation with Coumadin. She is recently had a supratherapeutic INR near 5. This is corrected as of today. Intermittent black stools noted by the patient "for some time now" but is on iron supplementation. Denies abdominal pain, nausea, vomiting, symptoms of anemia such as chest pain, dyspnea, dizziness, lightheadedness, passing out, nearly passing out. Is anxious to get home. She was surprised to find out she has lost significant amount of blood. Rectal examination emergency department showed melena with heme positive on guaiac. Hemoglobin 7.5. Her Coumadin is currently being held.  Likely upper GI bleed. Differentials include esophagitis, gastritis, duodenitis, gastric erosion, gastric ulcer, duodenal ulcer, AVMs. She is on PPI as an outpatient. At this point there is no indication for urgent endoscopy given the fact that she is not actively bleeding. If she continues to drop her hemoglobin or develops active GI bleed we can reevaluate for possible endoscopy. We will follow her hemoglobin closely.   I discussed with her the possible need to do an endoscopy if she develops any worsening bleeding. I explained endoscopy to her and she seems amendable to it at this point, should it be necessary.  Plan: 1. Hold coumadin for now 2. Follow hemoglobin closely. 3. Transfuse as necessary. 4. Monitor for overt GI  bleeding 5. Monitor for symptoms of anemia. 6. Protonix by mouth twice daily 7. We'll continue to follow along with you 8. Supportive measures   Thank you for allowing [place] to participate in the care of [name] Merlin, DNP, AGNP-C Adult & Gerontological Nurse Practitioner [facility]    LOS: 0 days     04/29/2016, 2:04 PM

## 2016-04-29 NOTE — H&P (Signed)
History and Physical    [name] [name] [id number] DOB: 10/27/1940 DOA: 04/29/2016  PCP: [name], MD  Patient coming from: [facility]  Chief Complaint: Abnormal labs  HPI: [name] [name] is a 76 y.o. female with medical history significant of atrial fibrillation status post MAZE procedure with bioprosthetic mitral valve replacement, chronic respiratory failure with hypoxia, chronic combined CHF, CKD stage 3, is currently residing in a nursing center. Patient is on anticoagulation. She has had periodic labs drawn which has shown elevated BUN . hemoglobin has slowly been trending down. Baseline hemoglobin appears to be between 10-11. She has trended down to 7.5. She reports intermittent dark colored stools, but also takes iron tablets. She's not noticed any hematochezia. She is not lightheaded, dizzy, short of breath, weak. In the emergency room, stool tested positive for Hemoccult. Case was discussed with gastroenterology who recommended admission for further workup. Coumadin has been on hold and INR from today is 2.5.  Review of Systems: As per HPI otherwise 10 point review of systems negative.    Past Medical History:  Diagnosis Date  . Aortic insufficiency due to bicuspid aortic valve    Moderate by TEE  . Arthritis   . Chronic diastolic congestive heart failure ([place])    a) ECHO (09/2013) EF 50-55%  . Chronic respiratory failure with hypoxia (HCC) 04/10/2016   On 2 L nasal cannula oxygen chronically.  . CKD (chronic kidney disease) stage 4, GFR 15-29 ml/min (HCC)   . COPD (chronic obstructive pulmonary disease) ([place])   . Essential hypertension, benign   . History of stroke    2000 [place]  . History of TIA (transient ischemic attack)    Diagnosed 2003  . Hyperlipidemia   . Juvenile rheumatic fever    Age 31  . 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 NICM (nonischemic cardiomyopathy) ([place])    a) LHC (04/2013) no significant CAD  .  Obesity   . Paroxysmal atrial fibrillation (HCC)    s/p MAZE (05/2013)  . Pulmonary hypertension 04/10/2016   Peak pressure 66 mmHg, Echo 04/09/2016  . S/P Maze operation for atrial fibrillation 05/16/2013   Complete bilateral atrial lesion set using cryothermy and bipolar radiofrequency ablation with oversewing of LA appendage  . 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 Systolic dysfunction without heart failure 04/10/2016   EF 35-40%, ECHO 04/09/2016    Past Surgical History:  Procedure Laterality Date  . ABDOMINAL HYSTERECTOMY     Cervical Cancer  . CARDIOVERSION N/A 06/06/2013   Procedure: CARDIOVERSION;  Surgeon: Larey Dresser, MD;  Location: [place];  Service: Cardiovascular;  Laterality: N/A;  . INTRAOPERATIVE TRANSESOPHAGEAL ECHOCARDIOGRAM N/A 05/16/2013   Procedure: INTRAOPERATIVE TRANSESOPHAGEAL ECHOCARDIOGRAM;  Surgeon: Rexene Alberts, MD;  Location: [place];  Service: Open Heart Surgery;  Laterality: N/A;  . LEFT AND RIGHT HEART CATHETERIZATION WITH CORONARY ANGIOGRAM N/A 05/04/2013   Procedure: LEFT AND RIGHT HEART CATHETERIZATION WITH CORONARY ANGIOGRAM;  Surgeon: Jettie Booze, MD;  Location: [facility] CATH LAB;  Service: Cardiovascular;  Laterality: N/A;  . MINIMALLY INVASIVE MAZE PROCEDURE N/A 05/16/2013   Procedure: MINIMALLY INVASIVE MAZE PROCEDURE;  Surgeon: Rexene Alberts, MD;  Location: [place];  Service: Open Heart Surgery;  Laterality: N/A;  . MITRAL VALVE REPLACEMENT Right 05/16/2013   Procedure: MINIMALLY INVASIVE MITRAL VALVE (MV) REPLACEMENT;  Surgeon: Rexene Alberts, MD;  Location: [place];  Service: Open Heart Surgery;  Laterality: Right;  . Parathyroid/Thyroid surgery     Tumor  . TEE WITHOUT CARDIOVERSION N/A 04/06/2013   Procedure: TRANSESOPHAGEAL ECHOCARDIOGRAM (TEE);  Surgeon: [name], MD;  [facility];  Service: Cardiology;  Laterality: N/A;  . TEE WITHOUT CARDIOVERSION N/A 06/06/2013   Procedure: TRANSESOPHAGEAL ECHOCARDIOGRAM (TEE);  Surgeon: Larey Dresser, MD;  Location: [place];  Service: Cardiovascular;  Laterality: N/A;  . TOTAL KNEE ARTHROPLASTY Right      reports that she has never smoked. She has never used smokeless tobacco. She reports that she does not drink alcohol or use drugs.  Allergies  Allergen Reactions  . Indomethacin Other (See Comments)    dizziness  . Norvasc [Amlodipine Besylate] Cough    Family History  Problem Relation Age of Onset  . Heart failure Father   . Heart attack Brother     Prior to Admission medications   Medication Sig Start Date End Date Taking? Authorizing Provider  acetaminophen (TYLENOL) 325 MG tablet Take 650 mg by mouth every 4 (four) hours as needed.   Yes Historical Provider, MD  amiodarone (PACERONE) 200 MG tablet Take 0.5 tablets (100 mg total) by mouth daily. 10/10/14  Yes [name], MD  colchicine 0.6 MG tablet Take 0.5 tablets (0.3 mg total) by mouth daily. 10/11/13  Yes Rande Brunt, NP  ferrous sulfate 325 (65 FE) MG tablet Take 325 mg by mouth 2 (two) times daily with a meal.   Yes Historical Provider, MD  levothyroxine (SYNTHROID, LEVOTHROID) 50 MCG tablet Take 50 mcg by mouth daily before breakfast.   Yes Historical Provider, MD  omeprazole (PRILOSEC) 20 MG capsule Take 20 mg by mouth daily.  04/05/16  Yes Historical Provider, MD  potassium chloride SA (K-DUR,KLOR-CON) 20 MEQ tablet Take 20 mEq by mouth daily.   Yes Historical Provider, MD  simvastatin (ZOCOR) 10 MG tablet TAKE ONE TABLET BY MOUTH EVERY EVENING. 12/10/13  Yes Tiffany L Reed, DO  tolterodine (DETROL LA) 4 MG 24 hr capsule Take 4 mg by mouth daily.   Yes Historical Provider, MD  torsemide (DEMADEX) 100 MG tablet Take 50 mg by mouth daily.   Yes Historical Provider, MD  traMADol (ULTRAM) 50 MG tablet Take 25 mg by mouth every 4 (four)  hours as needed for moderate pain or severe pain.    Yes Historical Provider, MD    Physical Exam: Vitals:   04/29/16 1400 04/29/16 1415 04/29/16 1504 04/29/16 1620  BP: 128/55  131/62 (!) 120/43  Pulse:  (!) 53 61 61  Resp:   19   Temp:    97.7 F (36.5 C)  TempSrc:    Oral  SpO2:  98% 98% 93%  Weight:    76.2 kg (168 lb)  Height:    5\' 2"  (1.575 m)      Constitutional: NAD, calm, comfortable Vitals:   04/29/16 1400 04/29/16 1415 04/29/16 1504 04/29/16 1620  BP: 128/55  131/62 (!) 120/43  Pulse:  (!) 53 61 61  Resp:   19   Temp:    97.7 F (36.5 C)  TempSrc:    Oral  SpO2:  98% 98% 93%  Weight:    76.2 kg (168 lb)  Height:    5\' 2"  (1.575 m)   Eyes: PERRL, lids and conjunctivae normal ENMT: Mucous membranes are moist. Posterior pharynx clear of any exudate or lesions.Normal dentition.  Neck: normal, supple, no masses, no thyromegaly Respiratory:Crackles at bases. Normal respiratory effort. No accessory muscle use.  Cardiovascular: Regular rate and rhythm, no murmurs / rubs / gallops. No extremity edema. 2+ pedal pulses. No carotid bruits.  Abdomen: no tenderness, no masses palpated. No hepatosplenomegaly. Bowel sounds positive.  Musculoskeletal: no clubbing / cyanosis. No joint deformity upper and lower extremities. Good ROM, no contractures. Normal muscle tone.  Skin: Venous stasis changes in lower extremities. Neurologic: CN 2-12 grossly intact. Sensation intact, DTR normal. Strength 5/5 in all 4.  Psychiatric: Normal judgment and insight. Alert and oriented x 3. Normal mood.   Labs on Admission: I have personally reviewed following labs and imaging studies  CBC:  Recent Labs Lab 04/26/16 0700 04/29/16 0700  WBC 3.5* 3.6*  NEUTROABS 2.3  --   HGB 8.6* 7.5*  HCT 26.9* 23.2*  MCV 100.4* 99.6  PLT 103* [id number]*   Basic Metabolic Panel:  Recent Labs Lab 04/26/16 0700 04/29/16 0700  NA 137 137  K 4.1 3.7  CL 92* 95*  CO2 38* 33*  GLUCOSE 95 92  BUN 62*  61*  CREATININE 1.68* 1.82*  CALCIUM 9.1 8.7*   GFR: Estimated Creatinine Clearance: 25.5 mL/min (by C-G formula based on SCr of 1.82 mg/dL (H)). Liver Function Tests:  Recent Labs Lab 04/29/16 0700  AST 24  ALT 10*  ALKPHOS 92  BILITOT 1.0  PROT 5.8*  ALBUMIN 2.7*   No results for input(s): LIPASE, AMYLASE in the last 168 hours. No results for input(s): AMMONIA in the last 168 hours. Coagulation Profile:  Recent Labs Lab 04/26/16 0700 04/27/16 0700 04/28/16 1300 04/29/16 0700  INR 4.91* 4.55* 3.20 2.42   Cardiac Enzymes: No results for input(s): CKTOTAL, CKMB, CKMBINDEX, TROPONINI in the last 168 hours. BNP (last 3 results) No results for input(s): PROBNP in the last 8760 hours. HbA1C: No results for input(s): HGBA1C in the last 72 hours. CBG: No results for input(s): GLUCAP in the last 168 hours. Lipid Profile: No results for input(s): CHOL, HDL, LDLCALC, TRIG, CHOLHDL, LDLDIRECT in the last 72 hours. Thyroid Function Tests:  Recent Labs  04/29/16 0700  TSH 7.383*   Anemia Panel: No results for input(s): VITAMINB12, FOLATE, FERRITIN, TIBC, IRON, RETICCTPCT in the last 72 hours. Urine analysis:    Component Value Date/Time   COLORURINE YELLOW 06/24/2015 1828   APPEARANCEUR CLEAR 06/24/2015 1828   LABSPEC <1.005 (L) 06/24/2015 1828   PHURINE 5.5 06/24/2015 1828   GLUCOSEU NEGATIVE 06/24/2015 1828   HGBUR TRACE (A) 06/24/2015 1828   BILIRUBINUR NEGATIVE 06/24/2015 1828   KETONESUR NEGATIVE 06/24/2015 1828   PROTEINUR NEGATIVE 06/24/2015 1828   UROBILINOGEN 0.2 05/09/2014 1455   NITRITE NEGATIVE 06/24/2015 1828   LEUKOCYTESUR NEGATIVE 06/24/2015 1828    Radiological Exams on Admission: No results found.  Assessment/Plan Active Problems:   HTN (hypertension)   Atrial fibrillation (HCC)   Chronic combined systolic and diastolic CHF (congestive heart failure) (HCC)   Anemia in chronic kidney disease   DNR (do not resuscitate)   Hypothyroidism    CKD (chronic kidney disease) stage 3, GFR 30-59 ml/min   Chronic respiratory failure with hypoxia (HCC)   GI bleed    1. GI bleeding. Patient does report intermittent dark stools and tested positive for occult blood. GI is following. She is currently on PPI twice a day. may need to have endoscopy done. Keep on clear liquids.  2. Anemia. Likely an element of chronic disease as well as blood loss.  Recent B12 level was also noted to be in low normal range. She does not appear to be symptomatic at this time. Will repeat CBC in a.m. and if hemoglobin has declined further, can consider transfusion.  3. Atrial fibrillation. Anticoagulation currently on hold. She is rate controlled. Continue on amiodarone.  4. Hypothyroidism. Continue Synthroid  5. Chronic combined systolic and diastolic congestive heart failure. Most recent ejection fraction 35-40%. Appears compensated at this time. Continue on torsemide. Follow-up with cardiology  6. Chronic respiratory failure with hypoxia. On baseline oxygen requirement. Likely related to CHF. Continue to monitor  7. CKD stage III. Creatinine is near baseline. Continue to monitor.   DVT prophylaxis: SCDs Code Status: DNR Family Communication: no family present Disposition Plan: discharge home once improved Consults called: gastroenterology Admission status: inpatient, telemetry   Alexine Pilant MD [facility] Pager [phone]  If 7PM-7AM, please contact night-coverage [email or web address] Password [email or web address]  04/29/2016, 5:20 PM

## 2016-04-30 ENCOUNTER — Encounter (HOSPITAL_COMMUNITY): Payer: Self-pay | Admitting: Gastroenterology

## 2016-04-30 ENCOUNTER — Encounter (HOSPITAL_COMMUNITY)
Admission: EM | Disposition: A | Discharge status: Home Health | Payer: Self-pay | Source: Home / Self Care | Attending: Internal Medicine

## 2016-04-30 DIAGNOSIS — D631 Anemia in chronic kidney disease: Secondary | ICD-10-CM

## 2016-04-30 DIAGNOSIS — Z66 Do not resuscitate: Secondary | ICD-10-CM

## 2016-04-30 DIAGNOSIS — N183 Chronic kidney disease, stage 3 (moderate): Secondary | ICD-10-CM

## 2016-04-30 DIAGNOSIS — I5042 Chronic combined systolic (congestive) and diastolic (congestive) heart failure: Secondary | ICD-10-CM

## 2016-04-30 HISTORY — PX: ESOPHAGOGASTRODUODENOSCOPY: SHX5428

## 2016-04-30 LAB — CBC WITH DIFFERENTIAL/PLATELET
Basophils Absolute: 0 10*3/uL (ref 0.0–0.1)
Basophils Relative: 0 %
EOS PCT: 6 %
Eosinophils Absolute: 0.2 10*3/uL (ref 0.0–0.7)
HCT: 22.7 % — ABNORMAL LOW (ref 36.0–46.0)
Hemoglobin: 7.3 g/dL — ABNORMAL LOW (ref 12.0–15.0)
LYMPHS ABS: 0.7 10*3/uL (ref 0.7–4.0)
LYMPHS PCT: 25 %
MCH: 32.3 pg (ref 26.0–34.0)
MCHC: 32.2 g/dL (ref 30.0–36.0)
MCV: 100.4 fL — AB (ref 78.0–100.0)
MONO ABS: 0.3 10*3/uL (ref 0.1–1.0)
MONOS PCT: 11 %
Neutro Abs: 1.5 10*3/uL — ABNORMAL LOW (ref 1.7–7.7)
Neutrophils Relative %: 58 %
PLATELETS: 113 10*3/uL — AB (ref 150–400)
RBC: 2.26 MIL/uL — ABNORMAL LOW (ref 3.87–5.11)
RDW: 17 % — ABNORMAL HIGH (ref 11.5–15.5)
WBC: 2.6 10*3/uL — ABNORMAL LOW (ref 4.0–10.5)

## 2016-04-30 LAB — BASIC METABOLIC PANEL
Anion gap: 6 (ref 5–15)
BUN: 54 mg/dL — AB (ref 6–20)
CALCIUM: 8.8 mg/dL — AB (ref 8.9–10.3)
CO2: 33 mmol/L — ABNORMAL HIGH (ref 22–32)
Chloride: 96 mmol/L — ABNORMAL LOW (ref 101–111)
Creatinine, Ser: 1.78 mg/dL — ABNORMAL HIGH (ref 0.44–1.00)
GFR calc Af Amer: 31 mL/min — ABNORMAL LOW (ref >60)
GFR, EST NON AFRICAN AMERICAN: 27 mL/min — AB (ref >60)
GLUCOSE: 84 mg/dL (ref 65–99)
Potassium: 4.1 mmol/L (ref 3.5–5.1)
Sodium: 135 mmol/L (ref 135–145)

## 2016-04-30 LAB — PROTIME-INR
INR: 1.93
Prothrombin Time: 22.3 seconds — ABNORMAL HIGH (ref 11.4–15.2)

## 2016-04-30 LAB — OCCULT BLOOD, POC DEVICE: Fecal Occult Bld: POSITIVE — AB

## 2016-04-30 SURGERY — EGD (ESOPHAGOGASTRODUODENOSCOPY)
Anesthesia: Moderate Sedation

## 2016-04-30 MED ORDER — BISACODYL 5 MG PO TBEC
10.0000 mg | DELAYED_RELEASE_TABLET | Freq: Four times a day (QID) | ORAL | Status: AC
  Administered 2016-04-30: 10 mg via ORAL
  Filled 2016-04-30 (×2): qty 2

## 2016-04-30 MED ORDER — STERILE WATER FOR IRRIGATION IR SOLN
Status: DC | PRN
  Administered 2016-04-30: 2.5 mL

## 2016-04-30 MED ORDER — LIDOCAINE VISCOUS 2 % MT SOLN
OROMUCOSAL | Status: AC
  Filled 2016-04-30: qty 15

## 2016-04-30 MED ORDER — PANTOPRAZOLE SODIUM 40 MG PO TBEC
40.0000 mg | DELAYED_RELEASE_TABLET | Freq: Every day | ORAL | Status: DC
  Administered 2016-05-01 – 2016-05-04 (×4): 40 mg via ORAL
  Filled 2016-04-30 (×4): qty 1

## 2016-04-30 MED ORDER — FENTANYL CITRATE (PF) 100 MCG/2ML IJ SOLN
INTRAMUSCULAR | Status: AC
  Filled 2016-04-30: qty 2

## 2016-04-30 MED ORDER — LIDOCAINE VISCOUS 2 % MT SOLN
OROMUCOSAL | Status: DC | PRN
  Administered 2016-04-30: 5 mL via OROMUCOSAL

## 2016-04-30 MED ORDER — SODIUM CHLORIDE 0.9 % IV SOLN
INTRAVENOUS | Status: DC
  Administered 2016-04-30: 1000 mL via INTRAVENOUS

## 2016-04-30 MED ORDER — PEG 3350-KCL-NA BICARB-NACL 420 G PO SOLR
4000.0000 mL | Freq: Once | ORAL | Status: AC
  Administered 2016-04-30: 4000 mL via ORAL
  Filled 2016-04-30: qty 4000

## 2016-04-30 MED ORDER — MIDAZOLAM HCL 5 MG/5ML IJ SOLN
INTRAMUSCULAR | Status: DC | PRN
  Administered 2016-04-30 (×2): 1 mg via INTRAVENOUS

## 2016-04-30 MED ORDER — MIDAZOLAM HCL 5 MG/5ML IJ SOLN
INTRAMUSCULAR | Status: AC
  Filled 2016-04-30: qty 10

## 2016-04-30 MED ORDER — FENTANYL CITRATE (PF) 100 MCG/2ML IJ SOLN
INTRAMUSCULAR | Status: DC | PRN
  Administered 2016-04-30 (×2): 25 ug via INTRAVENOUS

## 2016-04-30 MED ORDER — MEPERIDINE HCL 100 MG/ML IJ SOLN
INTRAMUSCULAR | Status: AC
  Filled 2016-04-30: qty 2

## 2016-04-30 NOTE — Progress Notes (Signed)
Subjective: Doing well today. Denies abdominal pain, N/V. No bowel movement in the last 24 hours so no noted melena or hematochezia. Denies chest pain, dyspnea, dizziness, syncope, near syncope. No other upper or lower GI symptoms. Wants to go home.  Objective: Vital signs in last 24 hours: Temp:  [97.6 F (36.4 C)-97.9 F (36.6 C)] 97.6 F (36.4 C) (02/23 0645) Pulse Rate:  [53-69] 69 (02/23 0645) Resp:  [17-19] 17 (02/23 0645) BP: (120-131)/(43-65) 130/65 (02/23 0645) SpO2:  [93 %-100 %] 100 % (02/23 0645) Weight:  [168 lb (76.2 kg)] 168 lb (76.2 kg) (02/22 1620) Last BM Date: 04/29/16 General:   Alert and oriented, pleasant Head:  Normocephalic and atraumatic. Eyes:  No icterus, sclera clear. Conjuctiva pink.  Heart:  S1, S2 present, no murmurs noted.  Lungs: Clear to auscultation bilaterally, without wheezing, rales, or rhonchi.  Abdomen:  Bowel sounds present, soft, non-tender, non-distended. No HSM or hernias noted. No rebound or guarding. No masses appreciated  Msk:  Symmetrical without gross deformities. Pulses:  Normal pulses noted. Extremities:  Without clubbing or edema. Neurologic:  Alert;  grossly normal neurologically.  Psych:  Alert and cooperative. Normal mood and affect.  Intake/Output from previous day: No intake/output data recorded. Intake/Output this shift: No intake/output data recorded.  Lab Results:  Recent Labs  04/29/16 0700 04/30/16 0443  WBC 3.6* 2.6*  HGB 7.5* 7.3*  HCT 23.2* 22.7*  PLT 119* 113*   BMET  Recent Labs  04/29/16 0700 04/30/16 0443  NA 137 135  K 3.7 4.1  CL 95* 96*  CO2 33* 33*  GLUCOSE 92 84  BUN 61* 54*  CREATININE 1.82* 1.78*  CALCIUM 8.7* 8.8*   LFT  Recent Labs  04/29/16 0700  PROT 5.8*  ALBUMIN 2.7*  AST 24  ALT 10*  ALKPHOS 92  BILITOT 1.0   PT/INR  Recent Labs  04/29/16 0700 04/30/16 0443  LABPROT 26.8* 22.3*  INR 2.42 1.93   Hepatitis Panel No results for input(s): HEPBSAG, HCVAB,  HEPAIGM, HEPBIGM in the last 72 hours.   Studies/Results: No results found.  Assessment: Pleasant 76 year old female with a history of CVA and atrial fibrillation on chronic anticoagulation with Coumadin. She has recently had a supratherapeutic INR near 5 which has corrected with holding coumadin. Intermittent black stools noted by the patient "for some time now" but is on iron supplementation. Denies abdominal pain, nausea, vomiting, symptoms of anemia such as chest pain, dyspnea, dizziness, lightheadedness, passing out, nearly passing out. Is anxious to get home. She was surprised to find out she has lost significant amount of blood. Rectal examination emergency department showed melena with heme positive on guaiac. Hemoglobin 7.5. Her Coumadin is currently being held.  Likely upper GI bleed. Differentials include esophagitis, gastritis, duodenitis, gastric erosion, gastric ulcer, duodenal ulcer, AVMs. She is on PPI as an outpatient. At this point there is no indication for urgent endoscopy given the fact that she is not actively bleeding. If she continues to drop her hemoglobin or develops active GI bleed we can reevaluate for possible endoscopy. We will follow her hemoglobin closely.   I discussed with her the possible need to do an endoscopy if she develops any worsening bleeding. I explained endoscopy to her and she seems amendable to it at this point, should it be necessary.  Today her anemia is stable with hgb 7.3 (7.5 yesterday) and possible hydration effect. INR now 1.93. Creatinine stable. Today she's doing well. Denies GI symptoms. No symptoms  of anemia. Wants to go home. No further complaints. Discussed case with Dr. [name] Alar who states we can complete EGD today given INR < 2.5  Proceed with EGD with Dr. [name] Alar in near future: the risks, benefits, and alternatives have been discussed with the patient in detail. The patient states understanding and desires to proceed.   Plan: 1. EGD  today 2. Continue bid PPI 3. Monitor/trend hgb closely 4. Monitor for further GI bleeding 5. Transfuse as necessary 6. Supportive measures   Thank you for allowing [place] to participate in the care of [name] Merlin, DNP, AGNP-C Adult & Gerontological Nurse Practitioner [facility]     LOS: 1 day    04/30/2016, 8:33 AM

## 2016-04-30 NOTE — H&P (View-Only) (Signed)
Subjective: Doing well today. Denies abdominal pain, N/V. No bowel movement in the last 24 hours so no noted melena or hematochezia. Denies chest pain, dyspnea, dizziness, syncope, near syncope. No other upper or lower GI symptoms. Wants to go home.  Objective: Vital signs in last 24 hours: Temp:  [97.6 F (36.4 C)-97.9 F (36.6 C)] 97.6 F (36.4 C) (02/23 0645) Pulse Rate:  [53-69] 69 (02/23 0645) Resp:  [17-19] 17 (02/23 0645) BP: (120-131)/(43-65) 130/65 (02/23 0645) SpO2:  [93 %-100 %] 100 % (02/23 0645) Weight:  [168 lb (76.2 kg)] 168 lb (76.2 kg) (02/22 1620) Last BM Date: 04/29/16 General:   Alert and oriented, pleasant Head:  Normocephalic and atraumatic. Eyes:  No icterus, sclera clear. Conjuctiva pink.  Heart:  S1, S2 present, no murmurs noted.  Lungs: Clear to auscultation bilaterally, without wheezing, rales, or rhonchi.  Abdomen:  Bowel sounds present, soft, non-tender, non-distended. No HSM or hernias noted. No rebound or guarding. No masses appreciated  Msk:  Symmetrical without gross deformities. Pulses:  Normal pulses noted. Extremities:  Without clubbing or edema. Neurologic:  Alert;  grossly normal neurologically.  Psych:  Alert and cooperative. Normal mood and affect.  Intake/Output from previous day: No intake/output data recorded. Intake/Output this shift: No intake/output data recorded.  Lab Results:  Recent Labs  04/29/16 0700 04/30/16 0443  WBC 3.6* 2.6*  HGB 7.5* 7.3*  HCT 23.2* 22.7*  PLT 119* 113*   BMET  Recent Labs  04/29/16 0700 04/30/16 0443  NA 137 135  K 3.7 4.1  CL 95* 96*  CO2 33* 33*  GLUCOSE 92 84  BUN 61* 54*  CREATININE 1.82* 1.78*  CALCIUM 8.7* 8.8*   LFT  Recent Labs  04/29/16 0700  PROT 5.8*  ALBUMIN 2.7*  AST 24  ALT 10*  ALKPHOS 92  BILITOT 1.0   PT/INR  Recent Labs  04/29/16 0700 04/30/16 0443  LABPROT 26.8* 22.3*  INR 2.42 1.93   Hepatitis Panel No results for input(s): HEPBSAG, HCVAB,  HEPAIGM, HEPBIGM in the last 72 hours.   Studies/Results: No results found.  Assessment: Pleasant 76 year old female with a history of CVA and atrial fibrillation on chronic anticoagulation with Coumadin. She has recently had a supratherapeutic INR near 5 which has corrected with holding coumadin. Intermittent black stools noted by the patient "for some time now" but is on iron supplementation. Denies abdominal pain, nausea, vomiting, symptoms of anemia such as chest pain, dyspnea, dizziness, lightheadedness, passing out, nearly passing out. Is anxious to get home. She was surprised to find out she has lost significant amount of blood. Rectal examination emergency department showed melena with heme positive on guaiac. Hemoglobin 7.5. Her Coumadin is currently being held.  Likely upper GI bleed. Differentials include esophagitis, gastritis, duodenitis, gastric erosion, gastric ulcer, duodenal ulcer, AVMs. She is on PPI as an outpatient. At this point there is no indication for urgent endoscopy given the fact that she is not actively bleeding. If she continues to drop her hemoglobin or develops active GI bleed we can reevaluate for possible endoscopy. We will follow her hemoglobin closely.   I discussed with her the possible need to do an endoscopy if she develops any worsening bleeding. I explained endoscopy to her and she seems amendable to it at this point, should it be necessary.  Today her anemia is stable with hgb 7.3 (7.5 yesterday) and possible hydration effect. INR now 1.93. Creatinine stable. Today she's doing well. Denies GI symptoms. No symptoms  of anemia. Wants to go home. No further complaints. Discussed case with Dr. [name] Alar who states we can complete EGD today given INR < 2.5  Proceed with EGD with Dr. [name] Alar in near future: the risks, benefits, and alternatives have been discussed with the patient in detail. The patient states understanding and desires to proceed.   Plan: 1. EGD  today 2. Continue bid PPI 3. Monitor/trend hgb closely 4. Monitor for further GI bleeding 5. Transfuse as necessary 6. Supportive measures   Thank you for allowing [place] to participate in the care of [name] Merlin, DNP, AGNP-C Adult & Gerontological Nurse Practitioner [facility]     LOS: 1 day    04/30/2016, 8:33 AM

## 2016-04-30 NOTE — Progress Notes (Signed)
PROGRESS NOTE    [name] [name]  [id number] DOB: 23-Feb-1941 DOA: 04/29/2016 PCP: [name], MD    Brief Narrative:  [name] [name] is a 76 y.o. female with medical history significant of atrial fibrillation status post MAZE procedure with bioprosthetic mitral valve replacement, chronic respiratory failure with hypoxia, chronic combined CHF, CKD stage 3, is currently residing in a nursing center. Patient is on anticoagulation. She has had periodic labs drawn which has shown elevated BUN . hemoglobin has slowly been trending down. Baseline hemoglobin appears to be between 10-11. She has trended down to 7.5. She reports intermittent dark colored stools, but also takes iron tablets. She's not noticed any hematochezia. She is not lightheaded, dizzy, short of breath, weak. In the emergency room, stool tested positive for Hemoccult. Case was discussed with gastroenterology who recommended admission for further workup.    Assessment & Plan:   Active Problems:   HTN (hypertension)   Atrial fibrillation (HCC)   Chronic combined systolic and diastolic CHF (congestive heart failure) (HCC)   Anemia in chronic kidney disease   DNR (do not resuscitate)   Hypothyroidism   CKD (chronic kidney disease) stage 3, GFR 30-59 ml/min   Chronic respiratory failure with hypoxia (HCC)   GI bleed   1. GI bleeding. Patient reported intermittent dark stools and tested positive for occult blood. GI is following. She is currently on PPI twice a day.  endoscopy done today did not show any source of melena. Recommendations are for colonoscopy. She is undergoing bowel prep.  2. Anemia. Likely an element of chronic disease as well as blood loss. Recent B12 level was also noted to be in low normal range. She does not appear to be symptomatic at this time. Will repeat CBC in a.m. and if hemoglobin has declined further, can consider transfusion. Currently hemoglobin has been stable.  3. Atrial  fibrillation. Anticoagulation currently on hold. She is rate controlled. Continue on amiodarone.  4. Hypothyroidism. Continue Synthroid  5. Chronic combined systolic and diastolic congestive heart failure. Most recent ejection fraction 35-40%. Appears compensated at this time. Continue on torsemide. Follow-up with cardiology  6. Chronic respiratory failure with hypoxia. On baseline oxygen requirement. Likely related to CHF. Continue to monitor  7. CKD stage III. Creatinine is near baseline. Continue to monitor.   DVT prophylaxis: SCDs Code Status: DO NOT RESUSCITATE Family Communication: Discussed with daughter at the bedside Disposition Plan: Probable discharge back to [facility]..   Consultants:   Gastroenterology  Procedures:  EGD:- MILD Gastritis.      - NO SOURCE FOR ANEMIA/MELENA IDENTIFIED  Antimicrobials:     Subjective: Patient had some nausea while drinking GoLYTELY. No abdominal pain.  Objective: Vitals:   04/30/16 1605 04/30/16 1610 04/30/16 1615 04/30/16 1643  BP: (!) 105/54 (!) 117/54 (!) 127/57 (!) 132/48  Pulse: (!) 56 (!) 56 (!) 58 64  Resp: 17 18 16 16   Temp:    97.7 F (36.5 C)  TempSrc:    Axillary  SpO2: 99% 92% 94% 97%  Weight:      Height:        Intake/Output Summary (Last 24 hours) at 04/30/16 1839 Last data filed at 04/30/16 1600  Gross per 24 hour  Intake              200 ml  Output                0 ml  Net  200 ml   Filed Weights   04/29/16 1325 04/29/16 1620  Weight: 76.2 kg (168 lb) 76.2 kg (168 lb)    Examination:  General exam: Appears calm and comfortable  Respiratory system: Clear to auscultation. Respiratory effort normal. Cardiovascular system: S1 & S2 heard, RRR. No JVD, murmurs, rubs, gallops or clicks. 1+ pedal edema. Gastrointestinal system: Abdomen is nondistended, soft and nontender. No organomegaly or masses felt. Normal bowel sounds heard. Central nervous system: Alert and oriented. No focal  neurological deficits. Extremities: Symmetric 5 x 5 power. Skin: Venous stasis changes in her lower extremities bilaterally. Psychiatry: Judgement and insight appear normal. Mood & affect appropriate.     Data Reviewed: I have personally reviewed following labs and imaging studies  CBC:  Recent Labs Lab 04/26/16 0700 04/29/16 0700 04/30/16 0443  WBC 3.5* 3.6* 2.6*  NEUTROABS 2.3  --  1.5*  HGB 8.6* 7.5* 7.3*  HCT 26.9* 23.2* 22.7*  MCV 100.4* 99.6 100.4*  PLT 103* 119* [id number]*   Basic Metabolic Panel:  Recent Labs Lab 04/26/16 0700 04/29/16 0700 04/30/16 0443  NA 137 137 135  K 4.1 3.7 4.1  CL 92* 95* 96*  CO2 38* 33* 33*  GLUCOSE 95 92 84  BUN 62* 61* 54*  CREATININE 1.68* 1.82* 1.78*  CALCIUM 9.1 8.7* 8.8*   GFR: Estimated Creatinine Clearance: 26.1 mL/min (by C-G formula based on SCr of 1.78 mg/dL (H)). Liver Function Tests:  Recent Labs Lab 04/29/16 0700  AST 24  ALT 10*  ALKPHOS 92  BILITOT 1.0  PROT 5.8*  ALBUMIN 2.7*   No results for input(s): LIPASE, AMYLASE in the last 168 hours. No results for input(s): AMMONIA in the last 168 hours. Coagulation Profile:  Recent Labs Lab 04/26/16 0700 04/27/16 0700 04/28/16 1300 04/29/16 0700 04/30/16 0443  INR 4.91* 4.55* 3.20 2.42 1.93   Cardiac Enzymes: No results for input(s): CKTOTAL, CKMB, CKMBINDEX, TROPONINI in the last 168 hours. BNP (last 3 results) No results for input(s): PROBNP in the last 8760 hours. HbA1C: No results for input(s): HGBA1C in the last 72 hours. CBG: No results for input(s): GLUCAP in the last 168 hours. Lipid Profile: No results for input(s): CHOL, HDL, LDLCALC, TRIG, CHOLHDL, LDLDIRECT in the last 72 hours. Thyroid Function Tests:  Recent Labs  04/29/16 0700  TSH 7.383*   Anemia Panel: No results for input(s): VITAMINB12, FOLATE, FERRITIN, TIBC, IRON, RETICCTPCT in the last 72 hours. Sepsis Labs: No results for input(s): PROCALCITON, LATICACIDVEN in the last  168 hours.  No results found for this or any previous visit (from the past 240 hour(s)).       Radiology Studies: No results found.      Scheduled Meds: . amiodarone  100 mg Oral Daily  . bisacodyl  10 mg Oral Q6H  . colchicine  0.3 mg Oral Daily  . fentaNYL      . fesoterodine  8 mg Oral Daily  . levothyroxine  50 mcg Oral QAC breakfast  . lidocaine      . midazolam      . [START ON 05/01/2016] pantoprazole  40 mg Oral QAC breakfast  . potassium chloride SA  20 mEq Oral Daily  . simvastatin  10 mg Oral QPM  . torsemide  50 mg Oral Daily   Continuous Infusions:   LOS: 1 day    Time spent: 36mins    MEMON,JEHANZEB, MD [facility] Pager [phone]  If 7PM-7AM, please contact night-coverage [email or web address] Password [facility] 04/30/2016, 6:39 PM

## 2016-04-30 NOTE — Care Management Note (Signed)
Case Management Note  Patient Details  Name: [name] [name] MRN: [id number] Date of Birth: 04/26/1940  Subjective/Objective:                  Pt admitted with GIB. She is from [facility]. Plan for pt to return to SNF. CSW consulted and will make arrangements for return to facility when ready.   Action/Plan: No CM needs anticipated.   Expected Discharge Date:      05/02/2016            Expected Discharge Plan:  [place]  In-House Referral:  Clinical Social Work  Discharge planning Services  CM Consult  Post Acute Care Choice:  NA Choice offered to:  NA  Status of Service:  Completed, signed off  Sherald Barge, RN 04/30/2016, 12:25 PM

## 2016-04-30 NOTE — Interval H&P Note (Signed)
History and Physical Interval Note:  04/30/2016 3:10 PM  [name] [name]  has presented today for surgery, with the diagnosis of Anemia, melena  The various methods of treatment have been discussed with the patient and family. After consideration of risks, benefits and other options for treatment, the patient has consented to  Procedure(s): ESOPHAGOGASTRODUODENOSCOPY (EGD) (N/A) as a surgical intervention .  The patient's history has been reviewed, patient examined, no change in status, stable for surgery.  I have reviewed the patient's chart and labs.  Questions were answered to the patient's satisfaction.     [employer]

## 2016-04-30 NOTE — Care Management Important Message (Signed)
Important Message  Patient Details  Name: [name] [name] MRN: [id number] Date of Birth: [birth year]   Medicare Important Message Given:  Yes    Sherald Barge, RN 04/30/2016, 9:47 AM

## 2016-04-30 NOTE — Op Note (Signed)
[facility] Patient Name: [name] [name] Procedure Date: 04/30/2016 3:14 PM MRN: [id number] Date of Birth: Aug 10, 1940 Attending MD: Barney Drain , MD CSN: [id number] Age: 76 Admit Type: Outpatient Procedure:                Upper GI endoscopy, DIAGNOSTIC Indications:              Iron deficiency anemia secondary to chronic blood                            loss Providers:                Barney Drain, MD, Charlyne Petrin RN, RN, Isabella Stalling, Technician Referring MD:             [name] MD, MD Medicines:                Fentanyl 50 micrograms IV, Midazolam 2 mg IV Complications:            No immediate complications. Estimated Blood Loss:     Estimated blood loss: none. Procedure:                Pre-Anesthesia Assessment:                           - Prior to the procedure, a History and Physical                            was performed, and patient medications and                            allergies were reviewed. The patient's tolerance of                            previous anesthesia was also reviewed. The risks                            and benefits of the procedure and the sedation                            options and risks were discussed with the patient.                            All questions were answered, and informed consent                            was obtained. Prior Anticoagulants: The patient has                            taken Coumadin (warfarin), last dose was 1 day                            prior to procedure. ASA Grade Assessment: III - A  patient with severe systemic disease. After                            reviewing the risks and benefits, the patient was                            deemed in satisfactory condition to undergo the                            procedure. After obtaining informed consent, the                            endoscope was passed under direct vision.               Throughout the procedure, the patient's blood                            pressure, pulse, and oxygen saturations were                            monitored continuously. The [id number]) was                            introduced through the mouth, and advanced to the                            second part of duodenum. The upper GI endoscopy was                            accomplished without difficulty. The patient                            tolerated the procedure well. Scope In: 3:37:08 PM Scope Out: 3:40:34 PM Total Procedure Duration: 0 hours 3 minutes 26 seconds  Findings:      The examined esophagus was normal.      Patchy mild inflammation characterized by congestion (edema) and       erythema was found in the gastric antrum.      The examined duodenum was normal. Impression:               - MILD Gastritis.                           - NO SOURCE FOR ANEMIA/MELENA IDENTIFIED Moderate Sedation:      Moderate (conscious) sedation was administered by the endoscopy nurse       and supervised by the endoscopist. The following parameters were       monitored: oxygen saturation, heart rate, blood pressure, and response       to care. Total physician intraservice time was 18 minutes. Recommendation:           - Full liquid diet. NPO AFTER MN. BOWEL PREP TODAY                            FOR COLNOSCOPY TOMORROW.                           -  Continue present medications.                           - Return patient to hospital ward for ongoing care. Procedure Code(s):        --- Professional ---                           [phone], Esophagogastroduodenoscopy, flexible,                            transoral; diagnostic, including collection of                            specimen(s) by brushing or washing, when performed                            (separate procedure)                           99152, Moderate sedation services provided by the                            same physician  or other qualified health care                            professional performing the diagnostic or                            therapeutic service that the sedation supports,                            requiring the presence of an independent trained                            observer to assist in the monitoring of the                            patient's level of consciousness and physiological                            status; initial 15 minutes of intraservice time,                            patient age 38 years or older Diagnosis Code(s):        --- Professional ---                           K29.70, Gastritis, unspecified, without bleeding                           D50.0, Iron deficiency anemia secondary to blood                            loss (chronic) CPT copyright 2016 American Medical Association. All rights reserved. The codes documented in this report are preliminary and upon  coder review may  be revised to meet current compliance requirements. Barney Drain, MD Barney Drain, MD 04/30/2016 3:57:30 PM This report has been signed electronically. Number of Addenda: 0

## 2016-05-01 ENCOUNTER — Encounter (HOSPITAL_COMMUNITY)
Admission: EM | Disposition: A | Discharge status: Home Health | Payer: Self-pay | Source: Home / Self Care | Attending: Internal Medicine

## 2016-05-01 ENCOUNTER — Encounter (HOSPITAL_COMMUNITY): Payer: Self-pay | Admitting: *Deleted

## 2016-05-01 HISTORY — PX: COLONOSCOPY: SHX5424

## 2016-05-01 HISTORY — PX: POLYPECTOMY: SHX5525

## 2016-05-01 LAB — BASIC METABOLIC PANEL
Anion gap: 7 (ref 5–15)
BUN: 49 mg/dL — AB (ref 6–20)
CALCIUM: 8.5 mg/dL — AB (ref 8.9–10.3)
CO2: 31 mmol/L (ref 22–32)
CREATININE: 1.99 mg/dL — AB (ref 0.44–1.00)
Chloride: 95 mmol/L — ABNORMAL LOW (ref 101–111)
GFR calc non Af Amer: 23 mL/min — ABNORMAL LOW (ref >60)
GFR, EST AFRICAN AMERICAN: 27 mL/min — AB (ref >60)
Glucose, Bld: 83 mg/dL (ref 65–99)
Potassium: 4.1 mmol/L (ref 3.5–5.1)
SODIUM: 133 mmol/L — AB (ref 135–145)

## 2016-05-01 LAB — CBC
HCT: 23.3 % — ABNORMAL LOW (ref 36.0–46.0)
Hemoglobin: 7.4 g/dL — ABNORMAL LOW (ref 12.0–15.0)
MCH: 32 pg (ref 26.0–34.0)
MCHC: 31.8 g/dL (ref 30.0–36.0)
MCV: 100.9 fL — ABNORMAL HIGH (ref 78.0–100.0)
PLATELETS: 125 10*3/uL — AB (ref 150–400)
RBC: 2.31 MIL/uL — AB (ref 3.87–5.11)
RDW: 17 % — ABNORMAL HIGH (ref 11.5–15.5)
WBC: 2.3 10*3/uL — AB (ref 4.0–10.5)

## 2016-05-01 LAB — PROTIME-INR
INR: 1.57
PROTHROMBIN TIME: 18.9 s — AB (ref 11.4–15.2)

## 2016-05-01 SURGERY — COLONOSCOPY
Anesthesia: Moderate Sedation

## 2016-05-01 MED ORDER — FENTANYL CITRATE (PF) 100 MCG/2ML IJ SOLN
INTRAMUSCULAR | Status: AC
  Filled 2016-05-01: qty 2

## 2016-05-01 MED ORDER — FENTANYL CITRATE (PF) 100 MCG/2ML IJ SOLN
INTRAMUSCULAR | Status: DC | PRN
  Administered 2016-05-01 (×2): 25 ug via INTRAVENOUS

## 2016-05-01 MED ORDER — MEPERIDINE HCL 100 MG/ML IJ SOLN
INTRAMUSCULAR | Status: AC
  Filled 2016-05-01: qty 2

## 2016-05-01 MED ORDER — STERILE WATER FOR IRRIGATION IR SOLN
Status: DC | PRN
  Administered 2016-05-01: 2.5 mL

## 2016-05-01 MED ORDER — CYANOCOBALAMIN 1000 MCG/ML IJ SOLN
1000.0000 ug | Freq: Once | INTRAMUSCULAR | Status: AC
  Administered 2016-05-01: 1000 ug via INTRAMUSCULAR
  Filled 2016-05-01: qty 1

## 2016-05-01 MED ORDER — SODIUM CHLORIDE 0.9 % IV SOLN
INTRAVENOUS | Status: DC
  Administered 2016-05-01: 09:00:00 via INTRAVENOUS

## 2016-05-01 MED ORDER — HYDROCODONE-ACETAMINOPHEN 5-325 MG PO TABS
1.0000 | ORAL_TABLET | ORAL | Status: DC | PRN
  Administered 2016-05-02 – 2016-05-03 (×3): 1 via ORAL
  Filled 2016-05-01 (×3): qty 1

## 2016-05-01 MED ORDER — MIDAZOLAM HCL 5 MG/5ML IJ SOLN
INTRAMUSCULAR | Status: AC
  Filled 2016-05-01: qty 10

## 2016-05-01 MED ORDER — ONDANSETRON HCL 4 MG/2ML IJ SOLN
INTRAMUSCULAR | Status: AC
  Filled 2016-05-01: qty 2

## 2016-05-01 MED ORDER — MIDAZOLAM HCL 5 MG/5ML IJ SOLN
INTRAMUSCULAR | Status: DC | PRN
  Administered 2016-05-01 (×3): 1 mg via INTRAVENOUS

## 2016-05-01 NOTE — Op Note (Addendum)
[facility] Patient Name: [name] [name] Procedure Date: 05/01/2016 8:33 AM MRN: [id number] Date of Birth: [birth year] Attending MD: Norvel Richards , MD CSN: [id number] Age: 76 Admit Type: Inpatient Procedure:                Ileo-Colonoscopy with snare polypectomy and biopsy Indications:              Heme positive stool Providers:                Norvel Richards, MD, Lurline Del, RN, Bonnetta Barry, Technician Referring MD:              Medicines:                Midazolam 3 mg IV, Meperidine 50 mg IV, Ondansetron                            4 mg IV Complications:            No immediate complications. Estimated Blood Loss:     Estimated blood loss was minimal. Procedure:                Pre-Anesthesia Assessment:                           - Prior to the procedure, a History and Physical                            was performed, and patient medications and                            allergies were reviewed. The patient's tolerance of                            previous anesthesia was also reviewed. The risks                            and benefits of the procedure and the sedation                            options and risks were discussed with the patient.                            All questions were answered, and informed consent                            was obtained. Prior Anticoagulants: The patient                            last took Coumadin (warfarin) 3 days prior to the                            procedure. ASA Grade Assessment: III - A patient  with severe systemic disease. After reviewing the                            risks and benefits, the patient was deemed in                            satisfactory condition to undergo the procedure.                           After obtaining informed consent, the colonoscope                            was passed under direct vision. Throughout the         procedure, the patient's blood pressure, pulse, and                            oxygen saturations were monitored continuously. The                            [id number] scope was introduced through                            the anus and advanced to the 3 cm into the ileum.                            The colonoscopy was performed without difficulty.                            The patient tolerated the procedure well. The                            quality of the bowel preparation was adequate. The                            terminal ileum, ileocecal valve, appendiceal                            orifice, and rectum were photographed. The entire                            colon was well visualized. Scope In: 9:34:26 AM Scope Out: 10:03:17 AM Scope Withdrawal Time: 0 hours 20 minutes 4 seconds  Total Procedure Duration: 0 hours 28 minutes 51 seconds  Findings:      The perianal and digital rectal examinations were normal.      Many small and large-mouthed diverticula were found in the entire colon.      The perianal and digital rectal examinations were normal.      Five sessile polyps were found in the descending colon, hepatic flexure       and cecum. The polyps were 4 to 6 mm in size. These polyps were removed       with a cold snare. Resection and retrieval were complete. Estimated       blood loss was minimal.      Markedly abnormal  mucosa throughout colon (predominantly involving the       transverse and ascending segments. Multiple "target"       erosions/ulcerations throughout the mid and more proximal colon.       Estimated blood loss was minimal. Distal 3 cm of TI appeared normal.      The exam was otherwise without abnormality on direct and retroflexion       views. Impression:               - Diverticulosis in the entire examined colon.                           - Five 4 to 6 mm polyps in the descending colon,                            hepatic flexure and in the  cecum, removed with a                            cold snare. Resected and retrieved.                           - Marked inflammation of the colonic mucosa.                            Findings not typical of inflammatory bowel disease                            or even ischemia (although latter more likely than                            former). Viral infection not excluded.                           - The examination was otherwise normal on direct                            and retroflexion views. Moderate Sedation:      Moderate (conscious) sedation was administered by the endoscopy nurse       and supervised by the endoscopist. The following parameters were       monitored: oxygen saturation, heart rate, blood pressure, respiratory       rate, EKG, adequacy of pulmonary ventilation, and response to care.       Total physician intraservice time was 34 minutes. Recommendation:           - Return patient to hospital ward for ongoing care.                           - Low fiber diet.                           - Hold Coumadin at least until pathology report is                            back for review. Specimens have been sent as a rush.                           -  No repeat colonoscopy due to age. Procedure Code(s):        --- Professional ---                           [phone], Colonoscopy, flexible; with removal of                            tumor(s), polyp(s), or other lesion(s) by snare                            technique                           45380, 59, Colonoscopy, flexible; with biopsy,                            single or multiple                           99152, Moderate sedation services provided by the                            same physician or other qualified health care                            professional performing the diagnostic or                            therapeutic service that the sedation supports,                            requiring the presence of an  independent trained                            observer to assist in the monitoring of the                            patient's level of consciousness and physiological                            status; initial 15 minutes of intraservice time,                            patient age 28 years or older                           [phone], Moderate sedation services; each additional                            15 minutes intraservice time Diagnosis Code(s):        --- Professional ---                           D12.4, Benign neoplasm of descending colon  D12.3, Benign neoplasm of transverse colon (hepatic                            flexure or splenic flexure)                           D12.0, Benign neoplasm of cecum                           K63.89, Other specified diseases of intestine                           R19.5, Other fecal abnormalities                           K57.30, Diverticulosis of large intestine without                            perforation or abscess without bleeding CPT copyright 2016 American Medical Association. All rights reserved. The codes documented in this report are preliminary and upon coder review may  be revised to meet current compliance requirements. Cristopher Estimable. Ieshia Hatcher, MD Norvel Richards, MD 05/01/2016 10:35:45 AM This report has been signed electronically. Number of Addenda: 0

## 2016-05-01 NOTE — Progress Notes (Signed)
PROGRESS NOTE    [name] [name]  [id number] DOB: September 17, 1940 DOA: 04/29/2016 PCP: [name], MD    Brief Narrative:  [name] [name] is a 76 y.o. female with medical history significant of atrial fibrillation status post MAZE procedure with bioprosthetic mitral valve replacement, chronic respiratory failure with hypoxia, chronic combined CHF, CKD stage 3, is currently residing in a nursing center. Patient is on anticoagulation. She has had periodic labs drawn which has shown elevated BUN . hemoglobin has slowly been trending down. Baseline hemoglobin appears to be between 10-11. She has trended down to 7.5. She reports intermittent dark colored stools, but also takes iron tablets. She's not noticed any hematochezia. She is not lightheaded, dizzy, short of breath, weak. In the emergency room, stool tested positive for Hemoccult. Case was discussed with gastroenterology who recommended admission for further workup.    Assessment & Plan:   Active Problems:   HTN (hypertension)   Atrial fibrillation (HCC)   Chronic combined systolic and diastolic CHF (congestive heart failure) (HCC)   Anemia in chronic kidney disease   DNR (do not resuscitate)   Hypothyroidism   CKD (chronic kidney disease) stage 3, GFR 30-59 ml/min   Chronic respiratory failure with hypoxia (HCC)   Gastrointestinal hemorrhage   1. GI bleeding. Patient reported intermittent dark stools and tested positive for occult blood. GI is following. She is currently on PPI daily.  endoscopy done today did not show any source of melena. Colonoscopy results as below. Biopsies have been sent, await results to make a further decision on resumption of Coumadin.  2. Anemia. Likely an element of chronic disease as well as blood loss. Recent B12 level was also noted to be in low normal range. Will replace. She does not appear to be symptomatic at this time. Will repeat CBC in a.m. and if hemoglobin has declined further, can  consider transfusion. Currently hemoglobin has been stable.  3. Atrial fibrillation. Anticoagulation currently on hold. She is rate controlled. Continue on amiodarone.  4. Hypothyroidism. Continue Synthroid  5. Chronic combined systolic and diastolic congestive heart failure. Most recent ejection fraction 35-40%. Appears compensated at this time. Continue on torsemide. Follow-up with cardiology  6. Chronic respiratory failure with hypoxia. On baseline oxygen requirement. Likely related to CHF. Continue to monitor  7. CKD stage III. Creatinine is near baseline. Continue to monitor.   DVT prophylaxis: SCDs Code Status: DO NOT RESUSCITATE Family Communication: Discussed with daughter at the bedside Disposition Plan: Probable discharge back to [facility].   Consultants:   Gastroenterology  Procedures:  EGD:- MILD Gastritis.      - NO SOURCE FOR ANEMIA/MELENA IDENTIFIED  Colonoscopy:  - Diverticulosis in the entire examined colon.                           - Five 4 to 6 mm polyps in the descending colon,                            hepatic flexure and in the cecum, removed with a                            cold snare. Resected and retrieved.                           - Marked inflammation of the  colonic mucosa.                            Findings not typical of inflammatory bowel disease                            or even ischemia (although latter more likely than                            former). Viral infection not excluded.                           - The examination was otherwise normal on direct                            and retroflexion views  Antimicrobials:     Subjective: Had an episode of desaturation last night. Denies any shortness of breath present. Feels that her wrist is doing better since brace has been applied.  Objective: Vitals:   05/01/16 1040 05/01/16 1045 05/01/16 1305 05/01/16 1407  BP: (!) 119/50  (!) 107/39 (!) 101/31  Pulse: (!) 55 (!)  55 61 (!) 57  Resp: 17 18  18   Temp:    97.6 F (36.4 C)  TempSrc:    Oral  SpO2: 92% 92% 94% 100%  Weight:      Height:        Intake/Output Summary (Last 24 hours) at 05/01/16 1519 Last data filed at 05/01/16 1100  Gross per 24 hour  Intake              450 ml  Output                0 ml  Net              450 ml   Filed Weights   04/29/16 1325 04/29/16 1620  Weight: 76.2 kg (168 lb) 76.2 kg (168 lb)    Examination:  General exam: Appears calm and comfortable  Respiratory system: Clear to auscultation. Respiratory effort normal. Cardiovascular system: S1 & S2 heard, RRR. No JVD, murmurs, rubs, gallops or clicks. 1+ pedal edema. Gastrointestinal system: Abdomen is nondistended, soft and nontender. No organomegaly or masses felt. Normal bowel sounds heard. Central nervous system: Alert and oriented. No focal neurological deficits. Extremities: Symmetric 5 x 5 power. Skin: Venous stasis changes in her lower extremities bilaterally. Psychiatry: Judgement and insight appear normal. Mood & affect appropriate.     Data Reviewed: I have personally reviewed following labs and imaging studies  CBC:  Recent Labs Lab 04/26/16 0700 04/29/16 0700 04/30/16 0443 05/01/16 0700  WBC 3.5* 3.6* 2.6* 2.3*  NEUTROABS 2.3  --  1.5*  --   HGB 8.6* 7.5* 7.3* 7.4*  HCT 26.9* 23.2* 22.7* 23.3*  MCV 100.4* 99.6 100.4* 100.9*  PLT 103* 119* 113* 0000000*   Basic Metabolic Panel:  Recent Labs Lab 04/26/16 0700 04/29/16 0700 04/30/16 0443 05/01/16 0700  NA 137 137 135 133*  K 4.1 3.7 4.1 4.1  CL 92* 95* 96* 95*  CO2 38* 33* 33* 31  GLUCOSE 95 92 84 83  BUN 62* 61* 54* 49*  CREATININE 1.68* 1.82* 1.78* 1.99*  CALCIUM 9.1 8.7* 8.8* 8.5*   GFR: Estimated Creatinine Clearance: 23.3 mL/min (by C-G formula based  on SCr of 1.99 mg/dL (H)). Liver Function Tests:  Recent Labs Lab 04/29/16 0700  AST 24  ALT 10*  ALKPHOS 92  BILITOT 1.0  PROT 5.8*  ALBUMIN 2.7*   No results for  input(s): LIPASE, AMYLASE in the last 168 hours. No results for input(s): AMMONIA in the last 168 hours. Coagulation Profile:  Recent Labs Lab 04/27/16 0700 04/28/16 1300 04/29/16 0700 04/30/16 0443 05/01/16 0700  INR 4.55* 3.20 2.42 1.93 1.57   Cardiac Enzymes: No results for input(s): CKTOTAL, CKMB, CKMBINDEX, TROPONINI in the last 168 hours. BNP (last 3 results) No results for input(s): PROBNP in the last 8760 hours. HbA1C: No results for input(s): HGBA1C in the last 72 hours. CBG: No results for input(s): GLUCAP in the last 168 hours. Lipid Profile: No results for input(s): CHOL, HDL, LDLCALC, TRIG, CHOLHDL, LDLDIRECT in the last 72 hours. Thyroid Function Tests:  Recent Labs  04/29/16 0700  TSH 7.383*   Anemia Panel: No results for input(s): VITAMINB12, FOLATE, FERRITIN, TIBC, IRON, RETICCTPCT in the last 72 hours. Sepsis Labs: No results for input(s): PROCALCITON, LATICACIDVEN in the last 168 hours.  No results found for this or any previous visit (from the past 240 hour(s)).       Radiology Studies: No results found.      Scheduled Meds: . fentaNYL      . midazolam      . ondansetron      . amiodarone  100 mg Oral Daily  . colchicine  0.3 mg Oral Daily  . fesoterodine  8 mg Oral Daily  . levothyroxine  50 mcg Oral QAC breakfast  . pantoprazole  40 mg Oral QAC breakfast  . potassium chloride SA  20 mEq Oral Daily  . simvastatin  10 mg Oral QPM  . torsemide  50 mg Oral Daily   Continuous Infusions: . sodium chloride Stopped (05/01/16 1129)     LOS: 2 days    Time spent: 45mins    Seferina Brokaw, MD [facility] Pager [phone]  If 7PM-7AM, please contact night-coverage [email or web address] Password [facility] 05/01/2016, 3:19 PM

## 2016-05-02 DIAGNOSIS — K625 Hemorrhage of anus and rectum: Secondary | ICD-10-CM

## 2016-05-02 LAB — CBC
HCT: 24.7 % — ABNORMAL LOW (ref 36.0–46.0)
HEMOGLOBIN: 7.8 g/dL — AB (ref 12.0–15.0)
MCH: 32.1 pg (ref 26.0–34.0)
MCHC: 31.6 g/dL (ref 30.0–36.0)
MCV: 101.6 fL — ABNORMAL HIGH (ref 78.0–100.0)
Platelets: 121 10*3/uL — ABNORMAL LOW (ref 150–400)
RBC: 2.43 MIL/uL — ABNORMAL LOW (ref 3.87–5.11)
RDW: 17 % — AB (ref 11.5–15.5)
WBC: 2.9 10*3/uL — ABNORMAL LOW (ref 4.0–10.5)

## 2016-05-02 LAB — BASIC METABOLIC PANEL
Anion gap: 6 (ref 5–15)
BUN: 49 mg/dL — AB (ref 6–20)
CALCIUM: 8.7 mg/dL — AB (ref 8.9–10.3)
CHLORIDE: 99 mmol/L — AB (ref 101–111)
CO2: 31 mmol/L (ref 22–32)
CREATININE: 2.12 mg/dL — AB (ref 0.44–1.00)
GFR calc non Af Amer: 22 mL/min — ABNORMAL LOW (ref >60)
GFR, EST AFRICAN AMERICAN: 25 mL/min — AB (ref >60)
Glucose, Bld: 127 mg/dL — ABNORMAL HIGH (ref 65–99)
Potassium: 4.4 mmol/L (ref 3.5–5.1)
Sodium: 136 mmol/L (ref 135–145)

## 2016-05-02 NOTE — Progress Notes (Signed)
Patient denies abdominal pain. Tolerating diet although she does not eat very much. Staff reports one large normal-appearing bowel movement this morning.  Remains pancytopenic  Vital signs in last 24 hours: Temp:  [97.6 F (36.4 C)-98 F (36.7 C)] 98 F (36.7 C) (02/25 0549) Pulse Rate:  [49-61] 49 (02/25 0549) Resp:  [16-18] 18 (02/25 0549) BP: (101-111)/(31-39) 110/35 (02/25 0549) SpO2:  [94 %-100 %] 98 % (02/25 0549) Last BM Date: 05/02/16 General:   Elderly, frail-appearing pleasant and cooperative in NAD Abdomen:  Nontender and nondistended.  Normal bowel sounds, without guarding, and without rebound.  No mass or organomegaly. Extremities:  Without clubbing or edema.    Intake/Output from previous day: 02/24 0701 - 02/25 0700 In: 52 [P.O.:240; I.V.:250] Out: -  Intake/Output this shift: No intake/output data recorded.  Lab Results:  Recent Labs  04/30/16 0443 05/01/16 0700  WBC 2.6* 2.3*  HGB 7.3* 7.4*  HCT 22.7* 23.3*  PLT 113* 125*   BMET  Recent Labs  04/30/16 0443 05/01/16 0700  NA 135 133*  K 4.1 4.1  CL 96* 95*  CO2 33* 31  GLUCOSE 84 83  BUN 54* 49*  CREATININE 1.78* 1.99*  CALCIUM 8.8* 8.5*   LFT No results for input(s): PROT, ALBUMIN, AST, ALT, ALKPHOS, BILITOT, BILIDIR, IBILI in the last 72 hours. PT/INR  Recent Labs  04/30/16 0443 05/01/16 0700  LABPROT 22.3* 18.9*  INR 1.93 1.57    Impression:   Acute on chronic anemia. Pancytopenic.  Recent drop in anemia most likely due to inflammatory changes seen in the colon in the setting of anticoagulation. Patient wants to go home.  She appears to be stable.  Recommendations:  Would favor watching her another 24 hours. Hopefully, we will get information back tomorrow on biopsy specimens.   As discussed with Dr. Roderic Palau yesterday, [facility] make further recommendations regarding anticoagulation therapy once pathology is available for review.

## 2016-05-02 NOTE — Progress Notes (Signed)
PROGRESS NOTE    [name] [name]  [id number] DOB: 1940/09/21 DOA: 04/29/2016 PCP: [name], MD    Brief Narrative:  [name] [name] is a 76 y.o. female with medical history significant of atrial fibrillation status post MAZE procedure with bioprosthetic mitral valve replacement, chronic respiratory failure with hypoxia, chronic combined CHF, CKD stage 3, is currently residing in a nursing center. Patient is on anticoagulation. She has had periodic labs drawn which has shown elevated BUN . hemoglobin has slowly been trending down. Baseline hemoglobin appears to be between 10-11. She has trended down to 7.5. She reports intermittent dark colored stools, but also takes iron tablets. She's not noticed any hematochezia. She is not lightheaded, dizzy, short of breath, weak. In the emergency room, stool tested positive for Hemoccult. Case was discussed with gastroenterology who recommended admission for further workup.    Assessment & Plan:   Active Problems:   HTN (hypertension)   Atrial fibrillation (HCC)   Chronic combined systolic and diastolic CHF (congestive heart failure) (HCC)   Anemia in chronic kidney disease   DNR (do not resuscitate)   Hypothyroidism   CKD (chronic kidney disease) stage 3, GFR 30-59 ml/min   Chronic respiratory failure with hypoxia (HCC)   Gastrointestinal hemorrhage   1. GI bleeding. Patient reported intermittent dark stools and tested positive for occult blood. GI is following. She is currently on PPI daily.  Endoscopy did not show any source of melena. Colonoscopy results as below. Biopsies have been sent, await results to make a further decision on resumption of Coumadin.  2. Anemia. Likely an element of chronic disease as well as blood loss. Recent B12 level was also noted to be in low normal range. Will replace. She does not appear to be symptomatic at this time. Will repeat CBC  and if hemoglobin has declined further, can consider  transfusion. Currently hemoglobin has been stable.  3. Atrial fibrillation. Anticoagulation currently on hold. She is rate controlled. Continue on amiodarone.  4. Hypothyroidism. Continue Synthroid  5. Chronic combined systolic and diastolic congestive heart failure. Most recent ejection fraction 35-40%. Appears compensated at this time. Continue on torsemide. Follow-up with cardiology  6. Chronic respiratory failure with hypoxia. On baseline oxygen requirement. Likely related to CHF. Continue to monitor  7. CKD stage III. Creatinine is near baseline. Continue to monitor.   DVT prophylaxis: SCDs Code Status: DO NOT RESUSCITATE Family Communication: Discussed with daughter at the bedside Disposition Plan: Probable discharge back to [facility].   Consultants:   Gastroenterology  Procedures:  EGD:- MILD Gastritis.      - NO SOURCE FOR ANEMIA/MELENA IDENTIFIED  Colonoscopy:  - Diverticulosis in the entire examined colon.                           - Five 4 to 6 mm polyps in the descending colon,                            hepatic flexure and in the cecum, removed with a                            cold snare. Resected and retrieved.                           - Marked inflammation of the colonic mucosa.  Findings not typical of inflammatory bowel disease                            or even ischemia (although latter more likely than                            former). Viral infection not excluded.                           - The examination was otherwise normal on direct                            and retroflexion views  Antimicrobials:     Subjective: Denies shortness of breath. Had normal appearing bowel movement this morning. Tolerating diet.  Objective: Vitals:   05/01/16 1704 05/01/16 2202 05/02/16 0549 05/02/16 1351  BP:  (!) 111/39 (!) 110/35 (!) 106/32  Pulse:  (!) 51 (!) 49 (!) 51  Resp:  16 18 18   Temp:  98 F (36.7 C) 98 F (36.7  C) 97.7 F (36.5 C)  TempSrc:  Oral Oral Oral  SpO2: 100% 98% 98% 99%  Weight:      Height:        Intake/Output Summary (Last 24 hours) at 05/02/16 1612 Last data filed at 05/01/16 1930  Gross per 24 hour  Intake              240 ml  Output                0 ml  Net              240 ml   Filed Weights   04/29/16 1325 04/29/16 1620  Weight: 76.2 kg (168 lb) 76.2 kg (168 lb)    Examination:  General exam: Appears calm and comfortable  Respiratory system: Clear to auscultation. Respiratory effort normal. Cardiovascular system: S1 & S2 heard, RRR. No JVD, murmurs, rubs, gallops or clicks. 1+ pedal edema. Gastrointestinal system: Abdomen is nondistended, soft and nontender. No organomegaly or masses felt. Normal bowel sounds heard. Central nervous system: Alert and oriented. No focal neurological deficits. Extremities: Symmetric 5 x 5 power. Skin: Venous stasis changes in her lower extremities bilaterally. Psychiatry: Judgement and insight appear normal. Mood & affect appropriate.     Data Reviewed: I have personally reviewed following labs and imaging studies  CBC:  Recent Labs Lab 04/26/16 0700 04/29/16 0700 04/30/16 0443 05/01/16 0700  WBC 3.5* 3.6* 2.6* 2.3*  NEUTROABS 2.3  --  1.5*  --   HGB 8.6* 7.5* 7.3* 7.4*  HCT 26.9* 23.2* 22.7* 23.3*  MCV 100.4* 99.6 100.4* 100.9*  PLT 103* 119* 113* 0000000*   Basic Metabolic Panel:  Recent Labs Lab 04/26/16 0700 04/29/16 0700 04/30/16 0443 05/01/16 0700  NA 137 137 135 133*  K 4.1 3.7 4.1 4.1  CL 92* 95* 96* 95*  CO2 38* 33* 33* 31  GLUCOSE 95 92 84 83  BUN 62* 61* 54* 49*  CREATININE 1.68* 1.82* 1.78* 1.99*  CALCIUM 9.1 8.7* 8.8* 8.5*   GFR: Estimated Creatinine Clearance: 23.3 mL/min (by C-G formula based on SCr of 1.99 mg/dL (H)). Liver Function Tests:  Recent Labs Lab 04/29/16 0700  AST 24  ALT 10*  ALKPHOS 92  BILITOT 1.0  PROT 5.8*  ALBUMIN 2.7*  No results for input(s): LIPASE, AMYLASE in  the last 168 hours. No results for input(s): AMMONIA in the last 168 hours. Coagulation Profile:  Recent Labs Lab 04/27/16 0700 04/28/16 1300 04/29/16 0700 04/30/16 0443 05/01/16 0700  INR 4.55* 3.20 2.42 1.93 1.57   Cardiac Enzymes: No results for input(s): CKTOTAL, CKMB, CKMBINDEX, TROPONINI in the last 168 hours. BNP (last 3 results) No results for input(s): PROBNP in the last 8760 hours. HbA1C: No results for input(s): HGBA1C in the last 72 hours. CBG: No results for input(s): GLUCAP in the last 168 hours. Lipid Profile: No results for input(s): CHOL, HDL, LDLCALC, TRIG, CHOLHDL, LDLDIRECT in the last 72 hours. Thyroid Function Tests: No results for input(s): TSH, T4TOTAL, FREET4, T3FREE, THYROIDAB in the last 72 hours. Anemia Panel: No results for input(s): VITAMINB12, FOLATE, FERRITIN, TIBC, IRON, RETICCTPCT in the last 72 hours. Sepsis Labs: No results for input(s): PROCALCITON, LATICACIDVEN in the last 168 hours.  No results found for this or any previous visit (from the past 240 hour(s)).       Radiology Studies: No results found.      Scheduled Meds: . amiodarone  100 mg Oral Daily  . colchicine  0.3 mg Oral Daily  . fesoterodine  8 mg Oral Daily  . levothyroxine  50 mcg Oral QAC breakfast  . pantoprazole  40 mg Oral QAC breakfast  . potassium chloride SA  20 mEq Oral Daily  . simvastatin  10 mg Oral QPM  . torsemide  50 mg Oral Daily   Continuous Infusions: . sodium chloride Stopped (05/01/16 1129)     LOS: 3 days    Time spent: 47mins    Michaele Amundson, MD [facility] Pager [phone]  If 7PM-7AM, please contact night-coverage [email or web address] Password [email or web address] 05/02/2016, 4:12 PM

## 2016-05-03 ENCOUNTER — Encounter: Payer: Self-pay | Admitting: Internal Medicine

## 2016-05-03 ENCOUNTER — Encounter (HOSPITAL_COMMUNITY): Payer: Self-pay | Admitting: Gastroenterology

## 2016-05-03 DIAGNOSIS — K922 Gastrointestinal hemorrhage, unspecified: Secondary | ICD-10-CM

## 2016-05-03 LAB — CBC
HCT: 25 % — ABNORMAL LOW (ref 36.0–46.0)
Hemoglobin: 7.9 g/dL — ABNORMAL LOW (ref 12.0–15.0)
MCH: 32.5 pg (ref 26.0–34.0)
MCHC: 31.6 g/dL (ref 30.0–36.0)
MCV: 102.9 fL — ABNORMAL HIGH (ref 78.0–100.0)
PLATELETS: 125 10*3/uL — AB (ref 150–400)
RBC: 2.43 MIL/uL — AB (ref 3.87–5.11)
RDW: 17.2 % — ABNORMAL HIGH (ref 11.5–15.5)
WBC: 3.4 10*3/uL — ABNORMAL LOW (ref 4.0–10.5)

## 2016-05-03 LAB — BASIC METABOLIC PANEL
Anion gap: 5 (ref 5–15)
BUN: 47 mg/dL — ABNORMAL HIGH (ref 6–20)
CALCIUM: 8.6 mg/dL — AB (ref 8.9–10.3)
CO2: 31 mmol/L (ref 22–32)
CREATININE: 2.11 mg/dL — AB (ref 0.44–1.00)
Chloride: 98 mmol/L — ABNORMAL LOW (ref 101–111)
GFR, EST AFRICAN AMERICAN: 25 mL/min — AB (ref >60)
GFR, EST NON AFRICAN AMERICAN: 22 mL/min — AB (ref >60)
Glucose, Bld: 94 mg/dL (ref 65–99)
Potassium: 4 mmol/L (ref 3.5–5.1)
SODIUM: 134 mmol/L — AB (ref 135–145)

## 2016-05-03 MED ORDER — WARFARIN - PHARMACIST DOSING INPATIENT: Status: DC

## 2016-05-03 MED ORDER — WARFARIN SODIUM 5 MG PO TABS: 2.5000 mg | ORAL_TABLET | Freq: Every day | ORAL | 0 refills | Status: DC

## 2016-05-03 MED ORDER — WARFARIN SODIUM 5 MG PO TABS
5.0000 mg | ORAL_TABLET | Freq: Once | ORAL | Status: AC
  Administered 2016-05-03: 5 mg via ORAL
  Filled 2016-05-03: qty 1

## 2016-05-03 NOTE — Progress Notes (Signed)
ANTICOAGULATION CONSULT NOTE - Initial Consult  Pharmacy Consult for Coumadin Indication: atrial fibrillation  Allergies  Allergen Reactions  . Indomethacin Other (See Comments)    dizziness  . Norvasc [Amlodipine Besylate] Cough    Patient Measurements: Height: 5\' 2"  (157.5 cm) Weight: 168 lb (76.2 kg) IBW/kg (Calculated) : 50.1  Vital Signs: Temp: 97.6 F (36.4 C) (02/26 0630) Temp Source: Oral (02/26 0630) BP: 106/34 (02/26 0630) Pulse Rate: 61 (02/26 0630)  Labs:  Recent Labs  05/01/16 0700 05/02/16 1633 05/03/16 0558  HGB 7.4* 7.8* 7.9*  HCT 23.3* 24.7* 25.0*  PLT 125* 121* 125*  LABPROT 18.9*  --   --   INR 1.57  --   --   CREATININE 1.99* 2.12* 2.11*    Estimated Creatinine Clearance: 22 mL/min (by C-G formula based on SCr of 2.11 mg/dL (H)).   Medical History: Past Medical History:  Diagnosis Date  . Aortic insufficiency due to bicuspid aortic valve    Moderate by TEE  . Arthritis   . Chronic diastolic congestive heart failure ([place])    a) ECHO (09/2013) EF 50-55%  . Chronic respiratory failure with hypoxia (HCC) 04/10/2016   On 2 L nasal cannula oxygen chronically.  . CKD (chronic kidney disease) stage 4, GFR 15-29 ml/min (HCC)   . COPD (chronic obstructive pulmonary disease) ([place])   . Essential hypertension, benign   . History of stroke    2000 [place]  . History of TIA (transient ischemic attack)    Diagnosed 2003  . Hyperlipidemia   . Juvenile rheumatic fever    Age 76  . 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 NICM (nonischemic cardiomyopathy) ([place])    a) LHC (04/2013) no significant CAD  . Obesity   . Paroxysmal atrial fibrillation (HCC)    s/p MAZE (05/2013)  . Pulmonary hypertension 04/10/2016   Peak pressure 66 mmHg, Echo 04/09/2016  . S/P Maze operation for atrial fibrillation 05/16/2013   Complete bilateral atrial lesion set using cryothermy and bipolar radiofrequency ablation with oversewing of LA appendage  . S/P  minimally invasive mitral valve replacement with bioprosthetic valve and maze procedure 05/16/2013   27 mm Edwards magna mitral bovine bioprosthetic tissue valve placed via right thoracotomy  . Systolic dysfunction without heart failure 04/10/2016   EF 35-40%, ECHO 04/09/2016    Medications:  Prescriptions Prior to Admission  Medication Sig Dispense Refill Last Dose  . acetaminophen (TYLENOL) 325 MG tablet Take 650 mg by mouth every 4 (four) hours as needed.   04/28/2016 at Unknown time  . amiodarone (PACERONE) 200 MG tablet Take 0.5 tablets (100 mg total) by mouth daily. 30 tablet 6 04/29/2016 at Unknown time  . colchicine 0.6 MG tablet Take 0.5 tablets (0.3 mg total) by mouth daily. 30 tablet 0 04/29/2016 at Unknown time  . ferrous sulfate 325 (65 FE) MG tablet Take 325 mg by mouth 2 (two) times daily with a meal.   04/29/2016 at Unknown time  . levothyroxine (SYNTHROID, LEVOTHROID) 50 MCG tablet Take 50 mcg by mouth daily before breakfast.   04/29/2016 at Unknown time  . omeprazole (PRILOSEC) 20 MG capsule Take 20 mg by mouth daily.    04/28/2016 at Unknown time  . potassium chloride SA (K-DUR,KLOR-CON) 20 MEQ tablet Take 20 mEq by mouth daily.   04/29/2016 at Unknown time  . simvastatin (ZOCOR) 10 MG tablet TAKE ONE TABLET BY MOUTH EVERY EVENING. 30 tablet 3 04/28/2016 at Unknown time  .  tolterodine (DETROL LA) 4 MG 24 hr capsule Take 4 mg by mouth daily.   04/29/2016 at Unknown time  . torsemide (DEMADEX) 100 MG tablet Take 50 mg by mouth daily.   04/29/2016 at Unknown time  . traMADol (ULTRAM) 50 MG tablet Take 25 mg by mouth every 4 (four) hours as needed for moderate pain or severe pain.    04/29/2016 at Unknown time  . [DISCONTINUED] furosemide (LASIX) 20 MG tablet Take 20 mg by mouth every other day.   04/29/2016 at Unknown time   Assessment: 76 y.o.femalewith medical history significant of atrial fibrillation status post MAZEprocedure with bioprosthetic mitral valve replacement, chronic  respiratory failure with hypoxia, chronic combined CHF, CKD stage 3,is currently residing in a nursing center. Patient is on chronic anticoagulation (Coumadin), presenting with supratherapeutic INR, heme positive stool, acute on chronic anemia. Coumadin on hold. EGD this admission with mild gastritis, colonoscopy with diverticulosis and five 4-6 mm polyps, marked inflammation of colonic mucosa, awaiting pathology. Remains pancytopenic, Hgb stable. No overt GI bleeding.  Hemoglobin has slowly been trending down. Baseline hemoglobin appears to be between 10-11. She has trended down to 7.5. She reports intermittent dark colored stools, but also takes iron tablets. She's not noticed any hematochezia. She is not lightheaded, dizzy, short of breath, weak. In the emergency room, stool tested (+) for hemoccult.   No Coumadin (or anticoagulants) listed on PTA med list, nursing home med list indicated previous dose was Coumadin 2mg  daily  INR on 2/24 was 1.57, no coumadin given since that time  Asked to resume Warfarin  Goal of Therapy:  INR 2-3 Monitor platelets by anticoagulation protocol: Yes   Plan:  Coumadin 5mg  today x 1 INR daily, CBC tomorrow am  Hart Robinsons A 05/03/2016,1:07 PM

## 2016-05-03 NOTE — Progress Notes (Signed)
Daughter notified that the pt's 02 was in the mid 60's on room air. Daughter will come tomorrow with another tank and regulator.

## 2016-05-03 NOTE — Clinical Social Work Note (Signed)
Pt's daughter requested to speak to CSW about disposition. Pt admitted from [facility], but plan was to d/c home that day as pt had used 20 days covered by insurance at 100%. Pt is unable to pay any copays. CSW shared that pt would require 60 days of wellness before receiving a reset of SNF days. Daughter indicates she thought being in hospital a few days was enough for reset. She states pt cannot pay any copays and will have to return home. Daughter is concerned as pt is currently moving and cannot stay with pt around the clock. She felt that pt had Medicaid, but our records do not show this. CSW shared that if she did, her check would be affected anyway. Confirmed information with [facility] who reports this is correct. Pt will return home with full home health services.  Benay Pike, [place]

## 2016-05-03 NOTE — Progress Notes (Addendum)
Pt in room. O2 tank that granddaughter brought was leaking. Granddaughter went home to get another one.

## 2016-05-03 NOTE — Discharge Summary (Signed)
Physician Discharge Summary  [name] [name] [id number] DOB: [id number] DOA: 04/29/2016  PCP: [name], MD  Admit date: 04/29/2016 Discharge date: 05/03/2016  Admitted From: SNF Disposition:  home  Recommendations for Outpatient Follow-up:  1. Follow up with PCP in 1-2 weeks 2. Please obtain BMP/CBC in one week 3. INR in 3 days 4. Follow up with GI as an outpatient  Home Health:HH RN, PT, OT, aide, SW Equipment/Devices:  Discharge Condition: stable CODE STATUS: DNR Diet recommendation: Heart Healthy   Brief/Interim Summary: [name] Balladares McDanielis a 76 y.o.femalewith medical history significant of atrial fibrillation status post MAZEprocedure with bioprosthetic mitral valve replacement, chronic respiratory failure with hypoxia, chronic combined CHF, CKD stage 3,is currently residing in a nursing center. Patient is on anticoagulation. She has had periodic labs drawn which has shown elevated BUN . hemoglobin has slowly been trending down. Baseline hemoglobin appears to be between 10-11. She has trended down to 7.5. She reports intermittent dark colored stools, but also takes iron tablets. She's not noticed any hematochezia. She is not lightheaded, dizzy, short of breath, weak. In the emergency room, stool tested positive for Hemoccult. Case was discussed with gastroenterology who recommended admission for further workup.   Discharge Diagnoses:  Active Problems:   HTN (hypertension)   Atrial fibrillation (HCC)   Chronic combined systolic and diastolic CHF (congestive heart failure) (HCC)   Anemia in chronic kidney disease   DNR (do not resuscitate)   Hypothyroidism   CKD (chronic kidney disease) stage 3, GFR 30-59 ml/min   Chronic respiratory failure with hypoxia (HCC)   Gastrointestinal hemorrhage  1. GI bleeding. Patient reported intermittent dark stools and tested positive for occult blood. GI is following. She is currently on PPI daily. Endoscopy did not show  any source of melena. Colonoscopy results as below. Biopsies from colonoscopy showed predominantly lymphocytic infiltrate in target lesions. Likely infectious. She is not having diarrhea. She will follow up with GI for further management. She has been cleared to restart coumadin.  2. Anemia. Likely an element of chronic disease as well as blood loss. Recent B12 level was also noted to be in low normal range. Will replace. She does not appear to be symptomatic at this time. Will repeat CBC  and if hemoglobin has declined further, can consider transfusion. Currently hemoglobin has been stable.  3. Atrial fibrillation. Anticoagulation with coumadin. She is rate controlled. Continue on amiodarone.  4. Hypothyroidism. Continue Synthroid  5. Chronic combined systolic and diastolic congestive heart failure. Most recent ejection fraction 35-40%. Appears compensated at this time. Continue on torsemide. Follow-up with cardiology  6. Chronic respiratory failure with hypoxia. On baseline oxygen requirement. Likely related to CHF. Continue to monitor  7. CKD stage III. Creatinine is near baseline. Continue to monitor.  Discharge Instructions  Discharge Instructions    Diet - low sodium heart healthy    Complete by:  As directed    Increase activity slowly    Complete by:  As directed      Allergies as of 05/03/2016      Reactions   Indomethacin Other (See Comments)   dizziness   Norvasc [amlodipine Besylate] Cough      Medication List    TAKE these medications   acetaminophen 325 MG tablet Commonly known as:  TYLENOL Take 650 mg by mouth every 4 (four) hours as needed.   amiodarone 200 MG tablet Commonly known as:  PACERONE Take 0.5 tablets (100 mg total) by mouth daily.   colchicine 0.6  MG tablet Take 0.5 tablets (0.3 mg total) by mouth daily.   ferrous sulfate 325 (65 FE) MG tablet Take 325 mg by mouth 2 (two) times daily with a meal.   levothyroxine 50 MCG tablet Commonly  known as:  SYNTHROID, LEVOTHROID Take 50 mcg by mouth daily before breakfast.   omeprazole 20 MG capsule Commonly known as:  PRILOSEC Take 20 mg by mouth daily.   potassium chloride SA 20 MEQ tablet Commonly known as:  K-DUR,KLOR-CON Take 20 mEq by mouth daily.   simvastatin 10 MG tablet Commonly known as:  ZOCOR TAKE ONE TABLET BY MOUTH EVERY EVENING.   tolterodine 4 MG 24 hr capsule Commonly known as:  DETROL LA Take 4 mg by mouth daily.   torsemide 100 MG tablet Commonly known as:  DEMADEX Take 50 mg by mouth daily.   traMADol 50 MG tablet Commonly known as:  ULTRAM Take 25 mg by mouth every 4 (four) hours as needed for moderate pain or severe pain.   warfarin 5 MG tablet Commonly known as:  COUMADIN Take 0.5 tablets (2.5 mg total) by mouth daily at 6 PM. Start taking on:  05/04/2016      Follow-up Information    [facility] Follow up.   Specialty:  [facility] Contact information: [address] [place] [address] [phone]          Allergies  Allergen Reactions  . Indomethacin Other (See Comments)    dizziness  . Norvasc [Amlodipine Besylate] Cough    Consultations:  gastroenterology   Procedures/Studies: Ct Head Wo Contrast  Result Date: 04/09/2016 CLINICAL DATA:  Weakness and left lower extremity.  Now resolved. EXAM: CT HEAD WITHOUT CONTRAST TECHNIQUE: Contiguous axial images were obtained from the base of the skull through the vertex without intravenous contrast. COMPARISON:  Head CT 11/15/2013 FINDINGS: Brain: No evidence of acute infarction, hemorrhage, hydrocephalus, extra-axial collection or mass lesion/mass effect. Moderate cerebral atrophy and chronic small vessel ischemia. Remote lacunar infarcts in the basal ganglia bilaterally. Vascular: Atherosclerosis of skullbase vasculature without hyperdense vessel or abnormal calcification. Skull: Normal. Negative for fracture or focal lesion. Sinuses/Orbits: Paranasal  sinuses and mastoid air cells are clear. The visualized orbits are unremarkable. Bilateral cataract resection. Other: None. IMPRESSION: No acute intracranial abnormality. Atrophy, remote and chronic small vessel ischemic change. Electronically Signed   By: Jeb Levering M.D.   On: 04/09/2016 03:07   Mr Brain Wo Contrast  Result Date: 04/09/2016 CLINICAL DATA:  76 year old female with altered mental status. Weakness and recent fall. Initial encounter. EXAM: MRI HEAD WITHOUT CONTRAST TECHNIQUE: Multiplanar, multiecho pulse sequences of the brain and surrounding structures were obtained without intravenous contrast. COMPARISON:  Head CT without contrast 0244 hours today and earlier. FINDINGS: Brain: No restricted diffusion to suggest acute infarction. No midline shift, mass effect, evidence of mass lesion, ventriculomegaly, extra-axial collection or acute intracranial hemorrhage. Cervicomedullary junction and pituitary are within normal limits. Patchy periventricular white matter T2 and FLAIR hyperintensity. Moderate T2 heterogeneity throughout the bilateral basal ganglia and thalami, but at least in part related to perivascular spaces. Small chronic lacunar infarct in the left cerebellum (series 7, image 5). Minimal T2 heterogeneity in the left pons. No cortical encephalomalacia identified. There are occasional chronic micro hemorrhages in the brain, including the left occipital lobe (series 10, image 7). Vascular: Major intracranial vascular flow voids are preserved. Persistent right trigeminal artery, better demonstrated on today MRA. Skull and upper cervical spine: Negative. Sinuses/Orbits: Postoperative changes  to both globes, otherwise negative. Other: Visible internal auditory structures appear normal. Mild left mastoid effusion is stable since 2015. Negative nasopharynx. Negative scalp soft tissues. IMPRESSION: 1. No acute intracranial abnormality. 2. Moderate for age signal changes, most pronounced in  the cerebral white matter and deep gray matter nuclei, most compatible with chronic small vessel disease. Electronically Signed   By: Genevie Ann M.D.   On: 04/09/2016 09:06   US Carotid Bilateral (at [facility] Only)  Result Date: 04/09/2016 CLINICAL DATA:  Left lower extremity weakness. EXAM: BILATERAL CAROTID DUPLEX ULTRASOUND TECHNIQUE: Pearline Cables scale imaging, color Doppler and duplex ultrasound were performed of bilateral carotid and vertebral arteries in the neck. COMPARISON:  CT 04/09/2016 . FINDINGS: Criteria: Quantification of carotid stenosis is based on velocity parameters that correlate the residual internal carotid diameter with NASCET-based stenosis levels, using the diameter of the distal internal carotid lumen as the denominator for stenosis measurement. The following velocity measurements were obtained: RIGHT ICA:  65/14 cm/sec CCA:  [id number] cm/sec SYSTOLIC ICA/CCA RATIO:  0.7 DIASTOLIC ICA/CCA RATIO:  1.4 ECA:  141 cm/sec LEFT ICA:  54/8 cm/sec CCA:  [id number] cm/sec SYSTOLIC ICA/CCA RATIO:  0.7 DIASTOLIC ICA/CCA RATIO:  0.7 ECA:  126 cm/sec RIGHT CAROTID ARTERY: Mild right carotid bifurcation /proximal ICA atherosclerotic vascular disease. No flow limiting stenosis. RIGHT VERTEBRAL ARTERY:  Patent with antegrade flow. LEFT CAROTID ARTERY: Mild left distal common carotid/carotid bifurcation atherosclerotic-disease. No flow limiting stenosis . LEFT VERTEBRAL ARTERY:  Patent with antegrade flow. IMPRESSION: 1. Mild right carotid bifurcation/ proximal ICA atherosclerotic vascular disease. No flow limiting stenosis. Degree of stenosis less than 50%. 2. Mild left distal common carotid/ carotid bifurcation atherosclerotic vascular disease. No flow limiting stenosis. Degree of stenosis less than 50%. 3.  Vertebral arteries are patent antegrade flow. Electronically Signed   By: Marcello Moores  Register   On: 04/09/2016 08:57   Mr Jodene Nam Head/brain Wo Cm  Result Date: 04/09/2016 CLINICAL DATA:  76 year old female with  altered mental status. Weakness and recent fall. Initial encounter. EXAM: MRA HEAD WITHOUT CONTRAST TECHNIQUE: Angiographic images of the Circle of Willis were obtained using MRA technique without intravenous contrast. COMPARISON:  Brain MRI from today reported separately. Carotid Doppler ultrasound from today. FINDINGS: Antegrade flow in the posterior circulation. Mildly dominant distal left vertebral artery. No distal vertebral stenosis. The right PICA origin is normal. Patent vertebrobasilar junction. Persistent right side trigeminal artery (normal anatomic variant). SCA and PCA origins are normal. Posterior communicating arteries are diminutive or absent. Bilateral PCA branches are within normal limits. Antegrade flow in both ICA siphons. Mild siphon irregularity greater on the left with no associated siphon stenosis. Normal ophthalmic artery origins. Normal carotid termini, MCA and ACA origins. Diminutive or absent anterior communicating artery. Visualized ACA branches are normal. MCA M1 segments, bifurcations, and visualized bilateral MCA branches are within normal limits. IMPRESSION: 1.  Negative intracranial MRA. 2. Persistent right trigeminal artery, an unusual normal anatomic variation. Electronically Signed   By: Genevie Ann M.D.   On: 04/09/2016 09:04    EGD:- MILD Gastritis.  - NO SOURCE FOR ANEMIA/MELENA IDENTIFIED  Colonoscopy: - Diverticulosis in the entire examined colon. - Five 4 to 6 mm polyps in the descending colon,  hepatic flexure and in the cecum, removed with a  cold snare. Resected and retrieved. - Marked inflammation of the colonic mucosa.  Findings not typical of inflammatory bowel disease  or even ischemia (although latter more likely than  former). Viral infection not  excluded. -  The examination was otherwise normal on direct  and retroflexion views   Subjective: Feeling better. No shortness of breath, wants to go home  Discharge Exam: Vitals:   05/03/16 0630 05/03/16 1348  BP: (!) 106/34 (!) 114/44  Pulse: 61 (!) 55  Resp: 18 18  Temp: 97.6 F (36.4 C) 98.3 F (36.8 C)   Vitals:   05/02/16 1351 05/02/16 2210 05/03/16 0630 05/03/16 1348  BP: (!) 106/32 (!) 97/38 (!) 106/34 (!) 114/44  Pulse: (!) 51 (!) 56 61 (!) 55  Resp: 18 18 18 18   Temp: 97.7 F (36.5 C) 98.7 F (37.1 C) 97.6 F (36.4 C) 98.3 F (36.8 C)  TempSrc: Oral Oral Oral Oral  SpO2: 99% 99% 96% 98%  Weight:      Height:        General exam: Appears calm and comfortable  Respiratory system: Clear to auscultation. Respiratory effort normal. Cardiovascular system: S1 & S2 heard, RRR. No JVD, murmurs, rubs, gallops or clicks. 1+ pedal edema. Gastrointestinal system: Abdomen is nondistended, soft and nontender. No organomegaly or masses felt. Normal bowel sounds heard. Central nervous system: Alert and oriented. No focal neurological deficits. Extremities: Symmetric 5 x 5 power. Skin: Venous stasis changes in her lower extremities bilaterally. Psychiatry: Judgement and insight appear normal. Mood & affect appropriate.    The results of significant diagnostics from this hospitalization (including imaging, microbiology, ancillary and laboratory) are listed below for reference.     Microbiology: No results found for this or any previous visit (from the past 240 hour(s)).   Labs: BNP (last 3 results) No results for input(s): BNP in the last 8760 hours. Basic Metabolic Panel:  Recent Labs Lab 04/29/16 0700 04/30/16 0443 05/01/16 0700 05/02/16 1633 05/03/16 0558  NA 137 135 133* 136 134*  K 3.7 4.1 4.1 4.4 4.0  CL 95* 96* 95* 99* 98*  CO2 33* 33* 31 31 31   GLUCOSE 92 84 83 127* 94  BUN 61* 54* 49* 49* 47*   CREATININE 1.82* 1.78* 1.99* 2.12* 2.11*  CALCIUM 8.7* 8.8* 8.5* 8.7* 8.6*   Liver Function Tests:  Recent Labs Lab 04/29/16 0700  AST 24  ALT 10*  ALKPHOS 92  BILITOT 1.0  PROT 5.8*  ALBUMIN 2.7*   No results for input(s): LIPASE, AMYLASE in the last 168 hours. No results for input(s): AMMONIA in the last 168 hours. CBC:  Recent Labs Lab 04/29/16 0700 04/30/16 0443 05/01/16 0700 05/02/16 1633 05/03/16 0558  WBC 3.6* 2.6* 2.3* 2.9* 3.4*  NEUTROABS  --  1.5*  --   --   --   HGB 7.5* 7.3* 7.4* 7.8* 7.9*  HCT 23.2* 22.7* 23.3* 24.7* 25.0*  MCV 99.6 100.4* 100.9* 101.6* 102.9*  PLT 119* 113* 125* 121* 125*   Cardiac Enzymes: No results for input(s): CKTOTAL, CKMB, CKMBINDEX, TROPONINI in the last 168 hours. BNP: Invalid input(s): POCBNP CBG: No results for input(s): GLUCAP in the last 168 hours. D-Dimer No results for input(s): DDIMER in the last 72 hours. Hgb A1c No results for input(s): HGBA1C in the last 72 hours. Lipid Profile No results for input(s): CHOL, HDL, LDLCALC, TRIG, CHOLHDL, LDLDIRECT in the last 72 hours. Thyroid function studies No results for input(s): TSH, T4TOTAL, T3FREE, THYROIDAB in the last 72 hours.  Invalid input(s): FREET3 Anemia work up No results for input(s): VITAMINB12, FOLATE, FERRITIN, TIBC, IRON, RETICCTPCT in the last 72 hours. Urinalysis    Component Value Date/Time   COLORURINE YELLOW 06/24/2015 1828  APPEARANCEUR CLEAR 06/24/2015 1828   LABSPEC <1.005 (L) 06/24/2015 1828   PHURINE 5.5 06/24/2015 1828   GLUCOSEU NEGATIVE 06/24/2015 1828   HGBUR TRACE (A) 06/24/2015 1828   BILIRUBINUR NEGATIVE 06/24/2015 1828   KETONESUR NEGATIVE 06/24/2015 1828   PROTEINUR NEGATIVE 06/24/2015 1828   UROBILINOGEN 0.2 05/09/2014 1455   NITRITE NEGATIVE 06/24/2015 1828   LEUKOCYTESUR NEGATIVE 06/24/2015 1828   Sepsis Labs Invalid input(s): PROCALCITONIN,  WBC,  LACTICIDVEN Microbiology No results found for this or any previous visit  (from the past 240 hour(s)).   Time coordinating discharge: Over 30 minutes  SIGNED:   Kathie Dike, MD  [facility] 05/03/2016, 2:47 PM Pager   If 7PM-7AM, please contact night-coverage [email or web address] Password [email or web address]

## 2016-05-03 NOTE — Evaluation (Signed)
Physical Therapy Evaluation Patient Details Name: [name] [name] MRN: [id number] DOB: [birth year] Today's Date: 05/03/2016   History of Present Illness  76 y.o. female with medical history significant of atrial fibrillation status post MAZE procedure with bioprosthetic mitral valve replacement, chronic respiratory failure with hypoxia, chronic combined CHF, CKD stage 3, is currently residing in a nursing center. Patient is on anticoagulation. She has had periodic labs drawn which has shown elevated BUN . hemoglobin has slowly been trending down. Baseline hemoglobin appears to be between 10-11. She has trended down to 7.5. She reports intermittent dark colored stools, but also takes iron tablets. She's not noticed any hematochezia. She is not lightheaded, dizzy, short of breath, weak. In the emergency room, stool tested positive for Hemoccult. Case was discussed with gastroenterology who recommended admission for further workup. Coumadin has been on hold and INR from today is 2.5.  Dx:  GI bleeding, and anemia.      Clinical Impression  Pt received in bed, dtr and granddaughter present, and pt is agreeable to PT evaluation.  Pt expressed that while she was at the [facility] she was working with PT, and was able to ambulate up to 33ft with RW and supervision.  She also stated that she was able to get herself dressed when clothes were set out, but she required assistance for bathing.  During today's evaluation, she required Mod A for sit<>stand - but states that she normally uses a lift chair at home.  She ambulated 47ft with RW and up to [place] for RW navigation.  At this point, she is recommended to return back to SNF, however she will likely choose to go home.  If that is the case, then she is recommended for HHPT, and HHOT.  She already has all the equipment she would need at this point.      Follow Up Recommendations SNF;Home health PT (Pt expressed that she does not want to go back to rehab.   )    Equipment Recommendations  None recommended by PT    Recommendations for Other Services       Precautions / Restrictions Precautions Precautions: Fall Precaution Comments: Fall at the beginning of the month.  Restrictions Weight Bearing Restrictions: No      Mobility  Bed Mobility Overal bed mobility:  (Not assessed)                Transfers Overall transfer level: Needs assistance Equipment used: Rolling walker (2 wheeled) Transfers: Sit to/from Stand Sit to Stand: Mod assist            Ambulation/Gait Ambulation/Gait assistance: Min assist;Min guard Ambulation Distance (Feet): 30 Feet Assistive device: Rolling walker (2 wheeled) Gait Pattern/deviations: Shuffle;Trunk flexed   Gait velocity interpretation: <1.8 ft/sec, indicative of risk for recurrent falls General Gait Details: Pt demonstrates difficulty navigating RW and requires assistance to navigate around obstacles.  She demonstrates slow gait speed.    Stairs            Wheelchair Mobility    Modified Rankin (Stroke Patients Only)       Balance Overall balance assessment: History of Falls;Needs assistance Sitting-balance support: Bilateral upper extremity supported;Feet supported Sitting balance-[name] Scale: Good     Standing balance support: Bilateral upper extremity supported Standing balance-[name] Scale: Poor                               Pertinent Vitals/Pain Pain  Assessment: No/denies pain (Dtr states that L hand always hurts.  )    Home Living   Living Arrangements: Alone (sister comes from [place] to stay for a few days, and dtr and granddaughter check on her occasionally. ) Available Help at Discharge: Family;Available PRN/intermittently   Home Access: Ramped entrance     Home Layout: One level Home Equipment: Other (comment);Walker - 2 wheels;Walker - 4 wheels;Hospital bed (O2, Lift chair)      Prior Function     Gait / Transfers Assistance  Needed: Pt was ambulating 70 some feet with PT and supervision.    ADL's / Homemaking Assistance Needed: pt assist with bathing, and set up for dressing.          Hand Dominance   Dominant Hand: Right    Extremity/Trunk Assessment   Upper Extremity Assessment Upper Extremity Assessment: Generalized weakness (Pt has a L wrist brace that she wears due to L wrist pain.  )    Lower Extremity Assessment Lower Extremity Assessment: Generalized weakness    Cervical / Trunk Assessment Cervical / Trunk Assessment: Kyphotic  Communication   Communication: No difficulties  Cognition Arousal/Alertness: Awake/alert Behavior During Therapy: WFL for tasks assessed/performed Overall Cognitive Status: Within Functional Limits for tasks assessed                      General Comments General comments (skin integrity, edema, etc.): B LE noted to have very dark and nearly black skin that is dry and scaly.  B feet checked and no ulcerations noted at this time.  L forearm noted to be edematous, likely due to IV fluids she has received - however it is painful, and she likes to wear a brace on her wrist.      Exercises     Assessment/Plan    PT Assessment Patient needs continued PT services  PT Problem List Decreased strength;Decreased activity tolerance;Decreased balance;Decreased mobility;Decreased safety awareness;Decreased cognition;Decreased knowledge of use of DME;Decreased knowledge of precautions;Obesity       PT Treatment Interventions DME instruction;Gait training;Functional mobility training;Therapeutic activities;Therapeutic exercise;Balance training;Neuromuscular re-education;Wheelchair mobility training;Patient/family education    PT Goals (Current goals can be found in the Care Plan section)  Acute Rehab PT Goals Patient Stated Goal: Pt wants to go home.  PT Goal Formulation: With patient/family Time For Goal Achievement: 05/10/16 Potential to Achieve Goals: Fair     Frequency Min 3X/week   Barriers to discharge Decreased caregiver support Pt lives alone    Co-evaluation               End of Session Equipment Utilized During Treatment: Gait belt;Oxygen Activity Tolerance: Patient limited by pain;Patient limited by fatigue Patient left: in chair;with call bell/phone within reach;with family/visitor present Nurse Communication: Mobility status PT Visit Diagnosis: Other abnormalities of gait and mobility (R26.89);Muscle weakness (generalized) (M62.81)    Functional Assessment Tool Used: AM-PAC 6 Clicks Basic Mobility;Clinical judgement Functional Limitation: Mobility: Walking and moving around Mobility: Walking and Moving Around Current Status [id number]): At least 40 percent but less than 60 percent impaired, limited or restricted Mobility: Walking and Moving Around Goal Status [phone]): At least 20 percent but less than 40 percent impaired, limited or restricted    Time: 1246-1314 PT Time Calculation (min) (ACUTE ONLY): 28 min   Charges:   PT Evaluation $PT Eval Low Complexity: 1 Procedure PT Treatments $Gait Training: 8-22 mins   PT G Codes:   PT G-Codes **NOT FOR INPATIENT CLASS** Functional Assessment Tool  Used: AM-PAC 6 Clicks Basic Mobility;Clinical judgement Functional Limitation: Mobility: Walking and moving around Mobility: Walking and Moving Around Current Status [phone]): At least 40 percent but less than 60 percent impaired, limited or restricted Mobility: Walking and Moving Around Goal Status [phone]): At least 20 percent but less than 40 percent impaired, limited or restricted     Beth Kaithlyn Teagle, PT, DPT X: [phone]

## 2016-05-03 NOTE — Progress Notes (Signed)
    Subjective: Denies abdominal pain, nausea, vomiting, overt GI bleeding. Fair appetite, states "eats as much as I can for hospital food". Wants to go home.   Objective: Vital signs in last 24 hours: Temp:  [97.6 F (36.4 C)-98.7 F (37.1 C)] 97.6 F (36.4 C) (02/26 0630) Pulse Rate:  [51-61] 61 (02/26 0630) Resp:  [18] 18 (02/26 0630) BP: (97-106)/(32-38) 106/34 (02/26 0630) SpO2:  [96 %-99 %] 96 % (02/26 0630) Last BM Date: 05/02/16 General:   Alert and oriented, pleasant Head:  Normocephalic and atraumatic. Eyes:  No icterus, sclera clear. Conjuctiva pink.  Abdomen:  Bowel sounds present, soft, non-tender, non-distended. No HSM or hernias noted. No rebound or guarding. No masses appreciated  Neurologic:  Alert and  oriented x4 Psych:  Alert and cooperative. Normal mood and affect.  Intake/Output from previous day: No intake/output data recorded. Intake/Output this shift: No intake/output data recorded.  Lab Results:  Recent Labs  05/01/16 0700 05/02/16 1633 05/03/16 0558  WBC 2.3* 2.9* 3.4*  HGB 7.4* 7.8* 7.9*  HCT 23.3* 24.7* 25.0*  PLT 125* 121* 125*   BMET  Recent Labs  05/01/16 0700 05/02/16 1633 05/03/16 0558  NA 133* 136 134*  K 4.1 4.4 4.0  CL 95* 99* 98*  CO2 31 31 31   GLUCOSE 83 127* 94  BUN 49* 49* 47*  CREATININE 1.99* 2.12* 2.11*  CALCIUM 8.5* 8.7* 8.6*   PT/INR  Recent Labs  05/01/16 0700  LABPROT 18.9*  INR 1.57    Assessment: 76 year old female on chronic anticoagulation (Coumadin), presenting with supratherapeutic INR, heme positive stool, acute on chronic anemia. Coumadin on hold. EGD this admission with mild gastritis, colonoscopy with diverticulosis and five 4-6 mm polyps, marked inflammation of colonic mucosa, awaiting pathology. Remains pancytopenic, Hgb stable. No overt GI bleeding. Hopeful pathology will be resulted today, with further recommendations for anticoagulation to follow.   Plan: Protonix daily before  breakfast Await pathology findings Continue to monitor for overt GI bleeding Hopeful discharge soon   Annitta Needs, ANP-BC [facility]    LOS: 4 days    05/03/2016, 7:48 AM

## 2016-05-03 NOTE — Care Management Note (Signed)
Case Management Note  Patient Details  Name: [name] [name] MRN: [id number] Date of Birth: 1940/06/27   Expected Discharge Date:       05/03/2016           Expected Discharge Plan:  [place]  In-House Referral:  Clinical Social Work  Discharge planning Services  CM Consult  Post Acute Care Choice:  Home Health Choice offered to:  Patient  HH Arranged:  RN, PT, OT, Nurse's Aide, Social Work [employer] Agency:  [facility] ([facility] [employer]  Status of Service:  Completed, signed off   Additional Comments: Pt now planning to DC home with [facility] services as her Medicare days have run out at [facility]. Pt lives alone and will have multiple family members to check on her often. Pt has home oxygen and walker at home PTA. She will need HH nursing, PT, OT, aid and CSW. Pt has chosen [facility] from [employer] of [facility] providers. Pt/family aware HH has 48hrs to make first visit. Tim Justis, of [facility] aware of referral and will obtain pt info from chart. Daughter at bedside for DC plan discussion.  Sherald Barge, RN 05/03/2016, 2:05 PM

## 2016-05-03 NOTE — Care Management Important Message (Signed)
Important Message  Patient Details  Name: [name] [name] MRN: [id number] Date of Birth: 10/01/1940   Medicare Important Message Given:  Yes    Sherald Barge, RN 05/03/2016, 2:04 PM

## 2016-05-04 ENCOUNTER — Telehealth: Payer: Self-pay | Admitting: Gastroenterology

## 2016-05-04 ENCOUNTER — Encounter: Payer: Self-pay | Admitting: Gastroenterology

## 2016-05-04 LAB — PROTIME-INR
INR: 1.3
Prothrombin Time: 16.3 seconds — ABNORMAL HIGH (ref 11.4–15.2)

## 2016-05-04 LAB — CBC
HCT: 25 % — ABNORMAL LOW (ref 36.0–46.0)
Hemoglobin: 7.9 g/dL — ABNORMAL LOW (ref 12.0–15.0)
MCH: 32.6 pg (ref 26.0–34.0)
MCHC: 31.6 g/dL (ref 30.0–36.0)
MCV: 103.3 fL — ABNORMAL HIGH (ref 78.0–100.0)
PLATELETS: 127 10*3/uL — AB (ref 150–400)
RBC: 2.42 MIL/uL — AB (ref 3.87–5.11)
RDW: 17.1 % — AB (ref 11.5–15.5)
WBC: 3 10*3/uL — AB (ref 4.0–10.5)

## 2016-05-04 NOTE — Progress Notes (Signed)
Mid level made aware (AMION) of pt not being discharged.

## 2016-05-04 NOTE — Progress Notes (Signed)
Pt c/o lt wrist pain. Splint intact. Pt stated that it always hurt at night. Tylenol given.

## 2016-05-04 NOTE — Telephone Encounter (Signed)
APPT MADE AND LETTER SENT  °

## 2016-05-04 NOTE — Telephone Encounter (Signed)
Please make appt for follow-up in 6-8 weeks.

## 2016-05-06 DIAGNOSIS — D631 Anemia in chronic kidney disease: Secondary | ICD-10-CM | POA: Diagnosis not present

## 2016-05-06 DIAGNOSIS — Z8719 Personal history of other diseases of the digestive system: Secondary | ICD-10-CM | POA: Diagnosis not present

## 2016-05-06 DIAGNOSIS — J449 Chronic obstructive pulmonary disease, unspecified: Secondary | ICD-10-CM | POA: Diagnosis not present

## 2016-05-06 DIAGNOSIS — N183 Chronic kidney disease, stage 3 (moderate): Secondary | ICD-10-CM | POA: Diagnosis not present

## 2016-05-06 DIAGNOSIS — I5042 Chronic combined systolic (congestive) and diastolic (congestive) heart failure: Secondary | ICD-10-CM | POA: Diagnosis not present

## 2016-05-06 DIAGNOSIS — I130 Hypertensive heart and chronic kidney disease with heart failure and stage 1 through stage 4 chronic kidney disease, or unspecified chronic kidney disease: Secondary | ICD-10-CM | POA: Diagnosis not present

## 2016-05-07 ENCOUNTER — Inpatient Hospital Stay (HOSPITAL_COMMUNITY)
Admission: EM | Admit: 2016-05-07 | Discharge: 2016-05-11 | DRG: 291 | Disposition: A | Discharge status: Skilled Nursing Facility | Payer: Medicare HMO | Attending: Internal Medicine | Admitting: Internal Medicine

## 2016-05-07 ENCOUNTER — Emergency Department (HOSPITAL_COMMUNITY): Payer: Medicare HMO

## 2016-05-07 ENCOUNTER — Encounter (HOSPITAL_COMMUNITY): Payer: Self-pay | Admitting: Emergency Medicine

## 2016-05-07 DIAGNOSIS — Z6832 Body mass index (BMI) 32.0-32.9, adult: Secondary | ICD-10-CM | POA: Diagnosis not present

## 2016-05-07 DIAGNOSIS — Z8249 Family history of ischemic heart disease and other diseases of the circulatory system: Secondary | ICD-10-CM

## 2016-05-07 DIAGNOSIS — Z66 Do not resuscitate: Secondary | ICD-10-CM | POA: Diagnosis present

## 2016-05-07 DIAGNOSIS — R0602 Shortness of breath: Secondary | ICD-10-CM

## 2016-05-07 DIAGNOSIS — E785 Hyperlipidemia, unspecified: Secondary | ICD-10-CM | POA: Diagnosis present

## 2016-05-07 DIAGNOSIS — E538 Deficiency of other specified B group vitamins: Secondary | ICD-10-CM | POA: Diagnosis present

## 2016-05-07 DIAGNOSIS — D539 Nutritional anemia, unspecified: Secondary | ICD-10-CM | POA: Diagnosis not present

## 2016-05-07 DIAGNOSIS — N183 Chronic kidney disease, stage 3 unspecified: Secondary | ICD-10-CM | POA: Diagnosis present

## 2016-05-07 DIAGNOSIS — E039 Hypothyroidism, unspecified: Secondary | ICD-10-CM | POA: Diagnosis not present

## 2016-05-07 DIAGNOSIS — M6281 Muscle weakness (generalized): Secondary | ICD-10-CM | POA: Diagnosis not present

## 2016-05-07 DIAGNOSIS — I10 Essential (primary) hypertension: Secondary | ICD-10-CM | POA: Diagnosis not present

## 2016-05-07 DIAGNOSIS — I272 Pulmonary hypertension, unspecified: Secondary | ICD-10-CM | POA: Diagnosis not present

## 2016-05-07 DIAGNOSIS — R2689 Other abnormalities of gait and mobility: Secondary | ICD-10-CM | POA: Diagnosis not present

## 2016-05-07 DIAGNOSIS — Z96651 Presence of right artificial knee joint: Secondary | ICD-10-CM | POA: Diagnosis present

## 2016-05-07 DIAGNOSIS — Z9981 Dependence on supplemental oxygen: Secondary | ICD-10-CM | POA: Diagnosis not present

## 2016-05-07 DIAGNOSIS — J9611 Chronic respiratory failure with hypoxia: Secondary | ICD-10-CM | POA: Diagnosis not present

## 2016-05-07 DIAGNOSIS — Z953 Presence of xenogenic heart valve: Secondary | ICD-10-CM

## 2016-05-07 DIAGNOSIS — I130 Hypertensive heart and chronic kidney disease with heart failure and stage 1 through stage 4 chronic kidney disease, or unspecified chronic kidney disease: Principal | ICD-10-CM | POA: Diagnosis present

## 2016-05-07 DIAGNOSIS — I482 Chronic atrial fibrillation: Secondary | ICD-10-CM | POA: Diagnosis not present

## 2016-05-07 DIAGNOSIS — I480 Paroxysmal atrial fibrillation: Secondary | ICD-10-CM | POA: Diagnosis present

## 2016-05-07 DIAGNOSIS — I428 Other cardiomyopathies: Secondary | ICD-10-CM | POA: Diagnosis present

## 2016-05-07 DIAGNOSIS — E669 Obesity, unspecified: Secondary | ICD-10-CM | POA: Diagnosis present

## 2016-05-07 DIAGNOSIS — I509 Heart failure, unspecified: Secondary | ICD-10-CM | POA: Diagnosis not present

## 2016-05-07 DIAGNOSIS — Z7901 Long term (current) use of anticoagulants: Secondary | ICD-10-CM

## 2016-05-07 DIAGNOSIS — Z79899 Other long term (current) drug therapy: Secondary | ICD-10-CM | POA: Diagnosis not present

## 2016-05-07 DIAGNOSIS — K219 Gastro-esophageal reflux disease without esophagitis: Secondary | ICD-10-CM | POA: Diagnosis present

## 2016-05-07 DIAGNOSIS — N184 Chronic kidney disease, stage 4 (severe): Secondary | ICD-10-CM | POA: Diagnosis not present

## 2016-05-07 DIAGNOSIS — M79642 Pain in left hand: Secondary | ICD-10-CM | POA: Diagnosis not present

## 2016-05-07 DIAGNOSIS — R531 Weakness: Secondary | ICD-10-CM | POA: Diagnosis not present

## 2016-05-07 DIAGNOSIS — Z8673 Personal history of transient ischemic attack (TIA), and cerebral infarction without residual deficits: Secondary | ICD-10-CM | POA: Diagnosis not present

## 2016-05-07 DIAGNOSIS — Z8541 Personal history of malignant neoplasm of cervix uteri: Secondary | ICD-10-CM

## 2016-05-07 DIAGNOSIS — I5043 Acute on chronic combined systolic (congestive) and diastolic (congestive) heart failure: Secondary | ICD-10-CM | POA: Diagnosis present

## 2016-05-07 DIAGNOSIS — I4821 Permanent atrial fibrillation: Secondary | ICD-10-CM | POA: Diagnosis present

## 2016-05-07 DIAGNOSIS — Z90710 Acquired absence of both cervix and uterus: Secondary | ICD-10-CM

## 2016-05-07 DIAGNOSIS — J449 Chronic obstructive pulmonary disease, unspecified: Secondary | ICD-10-CM | POA: Diagnosis present

## 2016-05-07 DIAGNOSIS — M19032 Primary osteoarthritis, left wrist: Secondary | ICD-10-CM | POA: Diagnosis present

## 2016-05-07 DIAGNOSIS — M1712 Unilateral primary osteoarthritis, left knee: Secondary | ICD-10-CM | POA: Diagnosis not present

## 2016-05-07 DIAGNOSIS — D649 Anemia, unspecified: Secondary | ICD-10-CM | POA: Diagnosis not present

## 2016-05-07 DIAGNOSIS — M25562 Pain in left knee: Secondary | ICD-10-CM | POA: Diagnosis not present

## 2016-05-07 DIAGNOSIS — I110 Hypertensive heart disease with heart failure: Secondary | ICD-10-CM | POA: Diagnosis not present

## 2016-05-07 DIAGNOSIS — R0600 Dyspnea, unspecified: Secondary | ICD-10-CM | POA: Diagnosis present

## 2016-05-07 LAB — COMPREHENSIVE METABOLIC PANEL
ALT: 11 U/L — ABNORMAL LOW (ref 14–54)
ANION GAP: 9 (ref 5–15)
AST: 23 U/L (ref 15–41)
Albumin: 2.9 g/dL — ABNORMAL LOW (ref 3.5–5.0)
Alkaline Phosphatase: 116 U/L (ref 38–126)
BILIRUBIN TOTAL: 0.7 mg/dL (ref 0.3–1.2)
BUN: 40 mg/dL — ABNORMAL HIGH (ref 6–20)
CO2: 27 mmol/L (ref 22–32)
Calcium: 9.1 mg/dL (ref 8.9–10.3)
Chloride: 100 mmol/L — ABNORMAL LOW (ref 101–111)
Creatinine, Ser: 1.9 mg/dL — ABNORMAL HIGH (ref 0.44–1.00)
GFR, EST AFRICAN AMERICAN: 29 mL/min — AB (ref >60)
GFR, EST NON AFRICAN AMERICAN: 25 mL/min — AB (ref >60)
Glucose, Bld: 96 mg/dL (ref 65–99)
POTASSIUM: 4.3 mmol/L (ref 3.5–5.1)
Sodium: 136 mmol/L (ref 135–145)
TOTAL PROTEIN: 6.6 g/dL (ref 6.5–8.1)

## 2016-05-07 LAB — CBC
HEMATOCRIT: 29.4 % — AB (ref 36.0–46.0)
Hemoglobin: 8.9 g/dL — ABNORMAL LOW (ref 12.0–15.0)
MCH: 31.1 pg (ref 26.0–34.0)
MCHC: 30.3 g/dL (ref 30.0–36.0)
MCV: 102.8 fL — AB (ref 78.0–100.0)
PLATELETS: 165 10*3/uL (ref 150–400)
RBC: 2.86 MIL/uL — ABNORMAL LOW (ref 3.87–5.11)
RDW: 17.3 % — AB (ref 11.5–15.5)
WBC: 3.6 10*3/uL — ABNORMAL LOW (ref 4.0–10.5)

## 2016-05-07 LAB — I-STAT TROPONIN, ED: TROPONIN I, POC: 0.01 ng/mL (ref 0.00–0.08)

## 2016-05-07 LAB — BRAIN NATRIURETIC PEPTIDE: B NATRIURETIC PEPTIDE 5: 1684.5 pg/mL — AB (ref 0.0–100.0)

## 2016-05-07 MED ORDER — FUROSEMIDE 10 MG/ML IJ SOLN
40.0000 mg | Freq: Once | INTRAMUSCULAR | Status: AC
  Administered 2016-05-08: 40 mg via INTRAVENOUS
  Filled 2016-05-07: qty 4

## 2016-05-07 MED ORDER — ACETAMINOPHEN 325 MG PO TABS
650.0000 mg | ORAL_TABLET | ORAL | Status: AC
  Administered 2016-05-07: 650 mg via ORAL

## 2016-05-07 MED ORDER — ACETAMINOPHEN 325 MG PO TABS
ORAL_TABLET | ORAL | Status: AC
  Filled 2016-05-07: qty 2

## 2016-05-07 NOTE — H&P (Signed)
History and Physical  Patient Name: [name] [name]     [id number]    DOB: 1940/12/11    DOA: 05/07/2016 PCP: [name], MD   Patient coming from: Home  Chief Complaint: Dyspnea, weakness  HPI: [name] [name] is a 76 y.o. female with a past medical history significant for CHF EF 35% on home O2, Afib s/p MAZE on warfarin, bioprosthetic MVR, and HTN who presents with weakness.  The patient's daughter provides most of the history, with patient's help.  Evidently there has been a gradual decline over months (last year, she was living alone, independent with all ADLs, family only needed to come by a few times per week).  About 2 months ago, she fell, was admitted to [facility], evaluated for TIA but ultimately thought to just be due to deconditioning, failure to thrive and discharged to [facility] for rehab for a few weeks.  Then as she was about to be discharged, she was found to be anemic and transferred to [facility] again, where she underwent colonoscopy that showed no source of bleeding, biopsy with nonspecific inflammation, and was B12 deficient and macrocytic.  Discharge summary states she will be started on oral B12 but D/C med list doesn't include supplement.  Now in the last two days, the patient has been back home after that hspitalization, but is too weak to stand. The patient insists she is not short of breath but her daughter states that she appears dyspneic with any exertion.  No new orthopnea, but leg swelling is increased.  No fever, chills, cough, sputum production, decreased urine output.    ED course: -Afebrile, heart rate 59, respirations and pulse ox stable on home O2, BP 147/60 -Na 136, K 4.3, Cr 1.9 (baseline 1.7-2.0), WBC 3.6K, Hgb 8.9 and macrocytic (up from 7.9 at discharge) -BNP >1600 pg/mL -Troponin negative -CXR showed bilateral edema -Knee and hand radiographs obtained because of pain, showed only OA, no fractures -She ws given furosemide 40 mg  IV and TRH were asked to evaluate for CHF       With regard to diuretic adherence, daughter states she is taking what she was discharged on faithfully, but that this is a new regimen that is "all screwed up".  She thinks she was taken off her diuretics in the hospital lst month because of worsening renal function (although from what I can see her renal function has been 1.3-1.9 since 2015).  Also, from what I can tell she used to be stable on torsemide 100 mg daily, most recently discharged on torsemide 50 mg daily.    ROS: Review of Systems  Constitutional: Negative for chills and fever.  Respiratory: Positive for shortness of breath. Negative for cough and sputum production.   Cardiovascular: Positive for leg swelling. Negative for chest pain, palpitations, orthopnea and PND.  Musculoskeletal: Positive for joint pain (left knee, left thumb).  Neurological: Positive for weakness.  All other systems reviewed and are negative.         Past Medical History:  Diagnosis Date  . Aortic insufficiency due to bicuspid aortic valve    Moderate by TEE  . Arthritis   . Chronic diastolic congestive heart failure ([place])    a) ECHO (09/2013) EF 50-55%  . Chronic respiratory failure with hypoxia (HCC) 04/10/2016   On 2 L nasal cannula oxygen chronically.  . CKD (chronic kidney disease) stage 4, GFR 15-29 ml/min (HCC)   . COPD (chronic obstructive pulmonary disease) ([place])   .  Essential hypertension, benign   . History of stroke    2000 [place]  . History of TIA (transient ischemic attack)    Diagnosed 2003  . Hyperlipidemia   . Juvenile rheumatic fever    Age 22  . 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 NICM (nonischemic cardiomyopathy) ([place])    a) LHC (04/2013) no significant CAD  . Obesity   . Paroxysmal atrial fibrillation (HCC)    s/p MAZE (05/2013)  . Pulmonary hypertension 04/10/2016   Peak pressure 66 mmHg, Echo 04/09/2016  . S/P Maze operation for atrial  fibrillation 05/16/2013   Complete bilateral atrial lesion set using cryothermy and bipolar radiofrequency ablation with oversewing of LA appendage  . 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 Systolic dysfunction without heart failure 04/10/2016   EF 35-40%, ECHO 04/09/2016    Past Surgical History:  Procedure Laterality Date  . ABDOMINAL HYSTERECTOMY     Cervical Cancer  . CARDIOVERSION N/A 06/06/2013   Procedure: CARDIOVERSION;  Surgeon: Larey Dresser, MD;  Location: [place];  Service: Cardiovascular;  Laterality: N/A;  . COLONOSCOPY N/A 05/01/2016   Procedure: COLONOSCOPY;  Surgeon: Daneil Dolin, MD;  [facility];  Service: Endoscopy;  Laterality: N/A;  . ESOPHAGOGASTRODUODENOSCOPY N/A 04/30/2016   Procedure: ESOPHAGOGASTRODUODENOSCOPY (EGD);  Surgeon: Danie Binder, MD;  [facility];  Service: Endoscopy;  Laterality: N/A;  . INTRAOPERATIVE TRANSESOPHAGEAL ECHOCARDIOGRAM N/A 05/16/2013   Procedure: INTRAOPERATIVE TRANSESOPHAGEAL ECHOCARDIOGRAM;  Surgeon: Rexene Alberts, MD;  Location: [place];  Service: Open Heart Surgery;  Laterality: N/A;  . LEFT AND RIGHT HEART CATHETERIZATION WITH CORONARY ANGIOGRAM N/A 05/04/2013   Procedure: LEFT AND RIGHT HEART CATHETERIZATION WITH CORONARY ANGIOGRAM;  Surgeon: Jettie Booze, MD;  Location: [facility] CATH LAB;  Service: Cardiovascular;  Laterality: N/A;  . MINIMALLY INVASIVE MAZE PROCEDURE N/A 05/16/2013   Procedure: MINIMALLY INVASIVE MAZE PROCEDURE;  Surgeon: Rexene Alberts, MD;  Location: [place];  Service: Open Heart Surgery;  Laterality: N/A;  . MITRAL VALVE REPLACEMENT Right 05/16/2013   Procedure: MINIMALLY INVASIVE MITRAL VALVE (MV) REPLACEMENT;  Surgeon: Rexene Alberts, MD;  Location: [place];  Service: Open Heart Surgery;  Laterality: Right;  . Parathyroid/Thyroid  surgery     Tumor  . POLYPECTOMY  05/01/2016   Procedure: POLYPECTOMY;  Surgeon: Daneil Dolin, MD;  [facility];  Service: Endoscopy;;  hepatic flexure  x2; cecal x2; descending x1  . TEE WITHOUT CARDIOVERSION N/A 04/06/2013   Procedure: TRANSESOPHAGEAL ECHOCARDIOGRAM (TEE);  Surgeon: [name], MD;  [facility];  Service: Cardiology;  Laterality: N/A;  . TEE WITHOUT CARDIOVERSION N/A 06/06/2013   Procedure: TRANSESOPHAGEAL ECHOCARDIOGRAM (TEE);  Surgeon: Larey Dresser, MD;  Location: [facility] ENDOSCOPY;  Service: Cardiovascular;  Laterality: N/A;  . TOTAL KNEE ARTHROPLASTY Right     Social History: Patient lived alone before this recent decline, since getting out of hospital and rehab has been living with daughters.  The patient walks with a walker.  Does not drive.  From [place] originally, lived most recently in [place].  Non-smoker.    Allergies  Allergen Reactions  . Indomethacin Other (See Comments)    dizziness  . Norvasc [Amlodipine Besylate] Cough    Family history: family history includes Heart attack in her brother; Heart failure in her father.  Prior to Admission medications   Medication Sig Start Date End Date Taking? Authorizing Provider  acetaminophen (TYLENOL) 325 MG tablet Take 650 mg by mouth every 4 (four) hours as needed for moderate pain.    Yes Historical Provider, MD  amiodarone (PACERONE) 200 MG tablet Take 0.5 tablets (100 mg total) by mouth daily. 10/10/14  Yes [name], MD  colchicine 0.6 MG tablet Take 0.5 tablets (0.3 mg total) by mouth daily. 10/11/13  Yes Rande Brunt, NP  ferrous sulfate 325 (65 FE) MG tablet Take 325 mg by mouth 2 (two) times daily with a meal.   Yes Historical Provider, MD  levothyroxine (SYNTHROID, LEVOTHROID) 50 MCG tablet Take 50 mcg by mouth daily before breakfast.   Yes Historical Provider, MD  omeprazole (PRILOSEC) 20 MG capsule Take 20 mg by mouth daily as needed (acid reflux).  04/05/16  Yes Historical  Provider, MD  potassium chloride SA (K-DUR,KLOR-CON) 20 MEQ tablet Take 20 mEq by mouth daily.   Yes Historical Provider, MD  simvastatin (ZOCOR) 10 MG tablet TAKE ONE TABLET BY MOUTH EVERY EVENING. 12/10/13  Yes Tiffany L Reed, DO  tolterodine (DETROL LA) 4 MG 24 hr capsule Take 4 mg by mouth daily.   Yes Historical Provider, MD  torsemide (DEMADEX) 100 MG tablet Take 50 mg by mouth daily.   Yes Historical Provider, MD  traMADol (ULTRAM) 50 MG tablet Take 25-50 mg by mouth every 6 (six) hours.    Yes Historical Provider, MD  warfarin (COUMADIN) 5 MG tablet Take 0.5 tablets (2.5 mg total) by mouth daily at 6 PM. 05/04/16 06/03/16 Yes Kathie Dike, MD       Physical Exam: BP 141/64   Pulse (!) 59   Temp 97.9 F (36.6 C) (Oral)   Resp 15   SpO2 100%  General appearance: Well-developed, elderly frail overweight adult female, alert and in mild distress from weakness.   Eyes: Anicteric, conjunctiva pink, lids and lashes normal. PERRL.    ENT: No nasal deformity, discharge, epistaxis.  Hearing slightly diminished. OP moist without lesions.   Neck: No neck masses.  Trachea midline.  No thyromegaly/tenderness. Lymph: No cervical or supraclavicular lymphadenopathy. Skin: Warm and dry.  No jaundice.  No suspicious rashes or lesions. Cardiac: Slow regular, nl S1-S2, actually no murmurs appreciated.  Capillary refill is brisk.  JVP elevated.  Brawny edema changes, ~2+ bilateral LE edema to about the knees.  Radial pulses 2+ and symmetric. Respiratory: Normal respiratory rate and rhythm.  Coarse sounds at bases, atelectasis vs edema. Abdomen: Abdomen soft.  No TTP. No ascites, distension, hepatosplenomegaly.   MSK: No deformities or effusions.  No cyanosis or clubbing. Neuro: Cranial nerves grossly normal.  Sensation intact to light touch. Speech is fluent.  Muscle strength globally very weak, nuable to sit up without assistance.    Psych: Sensorium intact and responding to questions, attention  normal.  Behavior appropriate.  Affect normal.  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     Labs on Admission:  I have personally reviewed following labs and imaging studies: CBC:  Recent Labs Lab 05/01/16 0700 05/02/16 1633 05/03/16 0558 05/04/16 0441 05/07/16 1815  WBC 2.3* 2.9* 3.4* 3.0* 3.6*  HGB 7.4* 7.8* 7.9* 7.9* 8.9*  HCT 23.3* 24.7* 25.0* 25.0* 29.4*  MCV 100.9* 101.6* 102.9* 103.3* 102.8*  PLT 125* 121* 125* 127* 616   Basic Metabolic Panel:  Recent Labs Lab 05/01/16 0700 05/02/16 1633 05/03/16 0558 05/07/16 1815  NA 133*  136 134* 136  K 4.1 4.4 4.0 4.3  CL 95* 99* 98* 100*  CO2 '31 31 31 27  ' GLUCOSE 83 127* 94 96  BUN 49* 49* 47* 40*  CREATININE 1.99* 2.12* 2.11* 1.90*  CALCIUM 8.5* 8.7* 8.6* 9.1   GFR: Estimated Creatinine Clearance: 24.4 mL/min (by C-G formula based on SCr of 1.9 mg/dL (H)).  Liver Function Tests:  Recent Labs Lab 05/07/16 1815  AST 23  ALT 11*  ALKPHOS 116  BILITOT 0.7  PROT 6.6  ALBUMIN 2.9*   No results for input(s): LIPASE, AMYLASE in the last 168 hours. No results for input(s): AMMONIA in the last 168 hours. Coagulation Profile:  Recent Labs Lab 05/01/16 0700 05/04/16 0441  INR 1.57 1.30   Cardiac Enzymes: No results for input(s): CKTOTAL, CKMB, CKMBINDEX, TROPONINI in the last 168 hours. BNP (last 3 results) No results for input(s): PROBNP in the last 8760 hours. HbA1C: No results for input(s): HGBA1C in the last 72 hours. CBG: No results for input(s): GLUCAP in the last 168 hours. Lipid Profile: No results for input(s): CHOL, HDL, LDLCALC, TRIG, CHOLHDL, LDLDIRECT in the last 72 hours. Thyroid Function Tests: No results for input(s): TSH, T4TOTAL, FREET4, T3FREE, THYROIDAB in the last 72 hours. Anemia Panel: No results for input(s): VITAMINB12, FOLATE, FERRITIN, TIBC, IRON, RETICCTPCT in the last 72 hours. Sepsis Labs: Invalid input(s): PROCALCITONIN, LACTICIDVEN No results found  for this or any previous visit (from the past 240 hour(s)).       Radiological Exams on Admission: Personally reviewed CXR shows bilatearl infiltrates consistent with edema: Dg Chest 2 View  Result Date: 05/07/2016 CLINICAL DATA:  Initial evaluation for hip acute shortness of breath. EXAM: CHEST  2 VIEW COMPARISON:  Prior radiograph from 07/30/2015. FINDINGS: Moderate cardiomegaly, stable. Prostatic bowel noted. Mediastinal silhouette within normal limits. Aortic atherosclerosis. Lungs normally inflated. Diffuse pulmonary vascular congestion with interstitial prominence, compatible with pulmonary interstitial edema. No focal infiltrates identified. No significant pleural effusion. No pneumothorax. No acute osseous abnormality. Remotely healed left-sided rib fractures noted. Diffuse osteopenia. IMPRESSION: 1. Cardiomegaly with mild to moderate diffuse pulmonary interstitial edema. 2. Aortic atherosclerosis. Electronically Signed   By: Jeannine Boga M.D.   On: 05/07/2016 23:10   Dg Knee Complete 4 Views Left  Result Date: 05/07/2016 CLINICAL DATA:  Initial evaluation for chronic left knee pain. EXAM: LEFT KNEE - COMPLETE 4+ VIEW COMPARISON:  None. FINDINGS: Diffuse osteopenia, somewhat limiting evaluation for possible subtle acute nondisplaced fractures. No acute fracture or dislocation identified. Severe tricompartmental degenerative osteoarthrosis. No significant joint effusion. No soft tissue abnormality. IMPRESSION: 1. No acute osseous abnormality about the knee. 2. Severe tricompartmental degenerative osteoarthrosis. 3. Diffuse osteopenia. Electronically Signed   By: Jeannine Boga M.D.   On: 05/07/2016 23:12   Dg Hand Complete Left  Result Date: 05/07/2016 CLINICAL DATA:  Initial evaluation for chronic left hand pain. EXAM: LEFT HAND - COMPLETE 3+ VIEW COMPARISON:  None available. FINDINGS: No acute fracture or dislocation. Extensive degenerative osteoarthritic changes present throughout  the joints of the hand, most notable at the first [facility] and MCP joints. Diffuse osteopenia. No soft tissue abnormality. IMPRESSION: 1. No acute osseous abnormality about the left hand. 2. Extensive degenerative osteoarthritic changes throughout the joints of the hand, most notable at the first [facility] and MCP joints. 3. Diffuse osteopenia. Electronically Signed   By: Jeannine Boga M.D.   On: 05/07/2016 23:15    EKG: Independently reviewed. Rate 60, QTc 490, IVCD, 1st deg AVB.  Echocardiogram 04/2016: - Left ventricle: Poor acoustic windows limit study Endocardium is   diffficult to see. Hypokinesis appears to be worse in thei   inferior/inferoseptal walls. The cavity size was normal. Wall   thickness was increased in a pattern of mild LVH. Systolic   function was moderately reduced. The estimated ejection fraction   was in the range of 35% to 40%. - Aortic valve: AV is functionally bicuspid It is thickened,   calcified Peak and mean gradients through the valve are 18 and 11   mm Hg respectively. There was moderate regurgitation. - Mitral valve: MV prosthesis is well seated. Difficult to see   leaflets well. Peak and mean gradients through the valve are 12   and 3 mm Hg respectively MVA by P T1/2 is 2 cm2. Valve area by   continuity equation (using LVOT flow): 2.19 cm^2. - Left atrium: The atrium was mildly dilated. - Right ventricle: Systolic function was mildly to moderately   reduced. - Right atrium: The atrium was mildly dilated. - Pulmonary arteries: PA peak pressure: 66 mm Hg (S).         Assessment/Plan  1. Acute on chronic combined systolic and diastolic CHF:  EF 50-53%   -Furosemide 40 mg daily -Strict I/Os, daily weights -Close monitoring of renal function -Continue simvastatin -Continue K supplement -Continue Home O2   2. Valvular atrial fibrillation:  CHADS2Vasc 5.  On warfarin. -Continue warfarin and amiodarone  3. Hand pain:  No redness or swelling.  ?OA. -Tramadol PRN  4. Hypothyroidism:  TSH slightly high, 7, during last admission. -Continue levothyroxine  5. Anemia:  Macrocytic, suspect B12 deficiency. -Start cyanocobalamin 1000 g daily -Continue iron  6. Other medications:  -Continue tolterodine -Continue PPI -Continue colchicine  7. Chronic kidney disease, stage IV:  Seems this is about where it is pending the last 2 years.     DVT prophylaxis: N/A on warfarin  Code Status:  DO NOT RESUSCITATE Family Communication: Daughter at bedside  Disposition Plan: Anticipate IV diuresis and close monitoring of respiratoyr status and electrolyes and renal function.  Likely will need placement at discharge.  Consults called: None Admission status: INPATIENT         Medical decision making: Patient seen at 11:30 AM on 05/07/2016.  The patient was discussed with Shary Decamp, PA-C.  What exists of the patient's chart was reviewed in depth and summarized above.  Clinical condition: stable hemodynamically for tele floor.        Edwin Dada [facility] Pager [phone]              At the time of admission, it appears that the appropriate admission status for this patient is INPATIENT. This is judged to be reasonable and necessary in order to provide the required intensity of service to ensure the patient's safety given the presenting symptoms, physical exam findings, and initial radiographic and laboratory data in the context of their chronic comorbidities.  Together, these circumstances are felt to place her at high risk for further clinical deterioration threatening life, limb, or organ.   Patient requires inpatient status due to high intensity of service, high risk for further deterioration and high frequency of surveillance required because of this severe exacerbation of their chronic organ failure.  Factors supporting inpatient status include: Congestive heart failure with pulmonary edema, BNP  greater than 1600, home O2 -Macrocytic anemia hemoglobin less than 9 -CKD stage IV, baseline creatinine 1.9 -Atrial fibrillation on warfarin  I certify that  at the point of admission it is my clinical judgment that the patient will require inpatient hospital care spanning beyond 2 midnights from the point of admission and that early discharge would result in unnecessary risk of decompensation and readmission or threat to life, limb or bodily function. er

## 2016-05-07 NOTE — ED Provider Notes (Signed)
[facility] DEPT Provider Note   CSN: [id number] Arrival date & time: 05/07/16  1727     History   Chief Complaint Chief Complaint  Patient presents with  . multiple complaints    HPI [name] [name] is a 76 y.o. female.  HPI  76 y.o. female with a hx of atrial fibrillation on Coumadin status post MAZEprocedure with bioprosthetic mitral valve replacement, chronic respiratory failure with hypoxia, chronic combined CHF, CKD stage 3, presents to the Emergency Department today complaining of generalized weakness, fatigue, shortness of breath with exertion since DC from [facility]. Admitted recently for low Hgb. DCed on 05-03-16. GI saw during hospitalization due to positive stool. Endoscopy/Colonoscopy unremarkable. Started on PPI daily. Given iron supplementation. Anemia thought to be related to chronic disease as well as blood loss. Daughter notes difficulty caring for at home with decrease in ambulation, unsteady gait. Attempting to find nursing facility through social work with minimal progress. Has taken meds as Rxed. Notes no pain currently. No fevers. No other symptoms noted    Past Medical History:  Diagnosis Date  . Aortic insufficiency due to bicuspid aortic valve    Moderate by TEE  . Arthritis   . Chronic diastolic congestive heart failure ([place])    a) ECHO (09/2013) EF 50-55%  . Chronic respiratory failure with hypoxia (HCC) 04/10/2016   On 2 L nasal cannula oxygen chronically.  . CKD (chronic kidney disease) stage 4, GFR 15-29 ml/min (HCC)   . COPD (chronic obstructive pulmonary disease) ([place])   . Essential hypertension, benign   . History of stroke    2000 [place]  . History of TIA (transient ischemic attack)    Diagnosed 2003  . Hyperlipidemia   . Juvenile rheumatic fever    Age 79  . 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 NICM (nonischemic cardiomyopathy) ([place])    a) LHC (04/2013) no significant CAD  . Obesity   . Paroxysmal atrial  fibrillation (HCC)    s/p MAZE (05/2013)  . Pulmonary hypertension 04/10/2016   Peak pressure 66 mmHg, Echo 04/09/2016  . S/P Maze operation for atrial fibrillation 05/16/2013   Complete bilateral atrial lesion set using cryothermy and bipolar radiofrequency ablation with oversewing of LA appendage  . 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 Systolic dysfunction without heart failure 04/10/2016   EF 35-40%, ECHO 04/09/2016    Patient Active Problem List   Diagnosis Date Noted  . Gastrointestinal hemorrhage 04/29/2016  . Anemia   . History of CVA in adulthood   . Pulmonary hypertension 04/10/2016  . Chronic respiratory failure with hypoxia ([place]) 04/10/2016  . TIA (transient ischemic attack) 04/09/2016  . Thrombocytopenia ([place]) 04/09/2016  . Physical deconditioning 04/09/2016  . Transient weakness of left lower extremity   . CKD (chronic kidney disease) stage 3, GFR 30-59 ml/min 06/25/2015  . CAP (community acquired pneumonia) 11/02/2014  . Anemia, iron deficiency 05/09/2014  . UTI (urinary tract infection) 05/09/2014  . Obesity 05/08/2014  . Hypothyroidism 05/08/2014  . Fx lumbar vertebra-closed ([place]) 11/12/2013  . Lumbar vertebral fracture ([place]) 11/11/2013  . DNR (do not resuscitate) 10/23/2013  . Anemia in chronic kidney disease 10/20/2013  . Acute on chronic diastolic heart failure ([place]) 10/14/2013  . Long term current use of anticoagulant therapy 06/22/2013  . Chronic combined systolic and diastolic CHF (congestive heart  failure) ([place]) 06/18/2013  . CVA (cerebral infarction) 05/21/2013  . Acute pulmonary edema ([place]) 05/18/2013  . 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 S/P Maze operation for atrial fibrillation 05/16/2013  . Aortic insufficiency due to bicuspid  aortic valve   . Atrial fibrillation ([place])   . Arthritis   . Acute respiratory failure ([place]) 05/02/2013  . Juvenile rheumatic fever   . Mitral regurgitation 02/16/2013  . HTN (hypertension) 02/15/2011    Past Surgical History:  Procedure Laterality Date  . ABDOMINAL HYSTERECTOMY     Cervical Cancer  . CARDIOVERSION N/A 06/06/2013   Procedure: CARDIOVERSION;  Surgeon: Larey Dresser, MD;  Location: [facility];  Service: Cardiovascular;  Laterality: N/A;  . COLONOSCOPY N/A 05/01/2016   Procedure: COLONOSCOPY;  Surgeon: Daneil Dolin, MD;  [facility];  Service: Endoscopy;  Laterality: N/A;  . ESOPHAGOGASTRODUODENOSCOPY N/A 04/30/2016   Procedure: ESOPHAGOGASTRODUODENOSCOPY (EGD);  Surgeon: Danie Binder, MD;  [facility];  Service: Endoscopy;  Laterality: N/A;  . INTRAOPERATIVE TRANSESOPHAGEAL ECHOCARDIOGRAM N/A 05/16/2013   Procedure: INTRAOPERATIVE TRANSESOPHAGEAL ECHOCARDIOGRAM;  Surgeon: Rexene Alberts, MD;  Location: [facility];  Service: Open Heart Surgery;  Laterality: N/A;  . LEFT AND RIGHT HEART CATHETERIZATION WITH CORONARY ANGIOGRAM N/A 05/04/2013   Procedure: LEFT AND RIGHT HEART CATHETERIZATION WITH CORONARY ANGIOGRAM;  Surgeon: Jettie Booze, MD;  Location: [facility] CATH LAB;  Service: Cardiovascular;  Laterality: N/A;  . MINIMALLY INVASIVE MAZE PROCEDURE N/A 05/16/2013   Procedure: MINIMALLY INVASIVE MAZE PROCEDURE;  Surgeon: Rexene Alberts, MD;  Location: [facility];  Service: Open Heart Surgery;  Laterality: N/A;  . MITRAL VALVE REPLACEMENT Right 05/16/2013   Procedure: MINIMALLY INVASIVE MITRAL VALVE (MV) REPLACEMENT;  Surgeon: Rexene Alberts, MD;  Location: [facility];  Service: Open Heart Surgery;  Laterality: Right;  . Parathyroid/Thyroid surgery     Tumor  . POLYPECTOMY  05/01/2016   Procedure: POLYPECTOMY;  Surgeon: Daneil Dolin, MD;  [facility];  Service: Endoscopy;;  hepatic flexure  x2; cecal x2; descending x1  . TEE WITHOUT CARDIOVERSION  N/A 04/06/2013   Procedure: TRANSESOPHAGEAL ECHOCARDIOGRAM (TEE);  Surgeon: [name], MD;  [facility];  Service: Cardiology;  Laterality: N/A;  . TEE WITHOUT CARDIOVERSION N/A 06/06/2013   Procedure: TRANSESOPHAGEAL ECHOCARDIOGRAM (TEE);  Surgeon: Larey Dresser, MD;  Location: [facility];  Service: Cardiovascular;  Laterality: N/A;  . TOTAL KNEE ARTHROPLASTY Right     OB History    No data available       Home Medications    Prior to Admission medications   Medication Sig Start Date End Date Taking? Authorizing Provider  acetaminophen (TYLENOL) 325 MG tablet Take 650 mg by mouth every 4 (four) hours as needed.    Historical Provider, MD  amiodarone (PACERONE) 200 MG tablet Take 0.5 tablets (100 mg total) by mouth daily. 10/10/14   [name], MD  colchicine 0.6 MG tablet Take 0.5 tablets (0.3 mg total) by mouth daily. 10/11/13   Rande Brunt, NP  ferrous sulfate 325 (65 FE) MG tablet Take 325 mg by mouth 2 (two) times daily with a meal.    Historical Provider, MD  levothyroxine (SYNTHROID, LEVOTHROID) 50 MCG tablet Take 50 mcg by mouth daily before breakfast.    Historical Provider, MD  omeprazole (PRILOSEC) 20 MG capsule Take 20 mg by mouth daily.  04/05/16   Historical Provider, MD  potassium chloride  SA (K-DUR,KLOR-CON) 20 MEQ tablet Take 20 mEq by mouth daily.    Historical Provider, MD  simvastatin (ZOCOR) 10 MG tablet TAKE ONE TABLET BY MOUTH EVERY EVENING. 12/10/13   Tiffany L Reed, DO  tolterodine (DETROL LA) 4 MG 24 hr capsule Take 4 mg by mouth daily.    Historical Provider, MD  torsemide (DEMADEX) 100 MG tablet Take 50 mg by mouth daily.    Historical Provider, MD  traMADol (ULTRAM) 50 MG tablet Take 25 mg by mouth every 4 (four) hours as needed for moderate pain or severe pain.     Historical Provider, MD  warfarin (COUMADIN) 5 MG tablet Take 0.5 tablets (2.5 mg total) by mouth daily at 6 PM. 05/04/16 06/03/16  Kathie Dike, MD    Family  History Family History  Problem Relation Age of Onset  . Heart failure Father   . Heart attack Brother     Social History Social History  Substance Use Topics  . Smoking status: Never Smoker  . Smokeless tobacco: Never Used  . Alcohol use No     Allergies   Indomethacin and Norvasc [amlodipine besylate]   Review of Systems Review of Systems ROS reviewed and all are negative for acute change except as noted in the HPI.  Physical Exam Updated Vital Signs BP 147/60 (BP Location: Left Arm)   Pulse (!) 59   Temp 97.9 F (36.6 C) (Oral)   Resp 16   SpO2 99%   Physical Exam  Constitutional: She is oriented to person, place, and time. Vital signs are normal. She appears well-developed and well-nourished.  HENT:  Head: Normocephalic and atraumatic.  Right Ear: Hearing normal.  Left Ear: Hearing normal.  Eyes: Conjunctivae and EOM are normal. Pupils are equal, round, and reactive to light.  Neck: Normal range of motion. Neck supple.  Cardiovascular: Normal rate, regular rhythm, normal heart sounds and intact distal pulses.   Pulmonary/Chest: Effort normal and breath sounds normal.  Abdominal: Soft. There is no tenderness.  Musculoskeletal: Normal range of motion.  3+ pitting edema above knee. No erythema. No signs of infection   Neurological: She is alert and oriented to person, place, and time.  Skin: Skin is warm and dry.  Psychiatric: She has a normal mood and affect. Her speech is normal and behavior is normal. Thought content normal.  Nursing note and vitals reviewed.  ED Treatments / Results  Labs (all labs ordered are listed, but only abnormal results are displayed) Labs Reviewed  CBC - Abnormal; Notable for the following:       Result Value   WBC 3.6 (*)    RBC 2.86 (*)    Hemoglobin 8.9 (*)    HCT 29.4 (*)    MCV 102.8 (*)    RDW 17.3 (*)    All other components within normal limits  COMPREHENSIVE METABOLIC PANEL - Abnormal; Notable for the following:     Chloride 100 (*)    BUN 40 (*)    Creatinine, Ser 1.90 (*)    Albumin 2.9 (*)    ALT 11 (*)    GFR calc non Af Amer 25 (*)    GFR calc Af Amer 29 (*)    All other components within normal limits  BRAIN NATRIURETIC PEPTIDE - Abnormal; Notable for the following:    B Natriuretic Peptide 1,684.5 (*)    All other components within normal limits  I-STAT TROPOININ, ED    EKG  EKG Interpretation None  Radiology Dg Chest 2 View  Result Date: 05/07/2016 CLINICAL DATA:  Initial evaluation for hip acute shortness of breath. EXAM: CHEST  2 VIEW COMPARISON:  Prior radiograph from 07/30/2015. FINDINGS: Moderate cardiomegaly, stable. Prostatic bowel noted. Mediastinal silhouette within normal limits. Aortic atherosclerosis. Lungs normally inflated. Diffuse pulmonary vascular congestion with interstitial prominence, compatible with pulmonary interstitial edema. No focal infiltrates identified. No significant pleural effusion. No pneumothorax. No acute osseous abnormality. Remotely healed left-sided rib fractures noted. Diffuse osteopenia. IMPRESSION: 1. Cardiomegaly with mild to moderate diffuse pulmonary interstitial edema. 2. Aortic atherosclerosis. Electronically Signed   By: Jeannine Boga M.D.   On: 05/07/2016 23:10   Dg Knee Complete 4 Views Left  Result Date: 05/07/2016 CLINICAL DATA:  Initial evaluation for chronic left knee pain. EXAM: LEFT KNEE - COMPLETE 4+ VIEW COMPARISON:  None. FINDINGS: Diffuse osteopenia, somewhat limiting evaluation for possible subtle acute nondisplaced fractures. No acute fracture or dislocation identified. Severe tricompartmental degenerative osteoarthrosis. No significant joint effusion. No soft tissue abnormality. IMPRESSION: 1. No acute osseous abnormality about the knee. 2. Severe tricompartmental degenerative osteoarthrosis. 3. Diffuse osteopenia. Electronically Signed   By: Jeannine Boga M.D.   On: 05/07/2016 23:12   Dg Hand Complete  Left  Result Date: 05/07/2016 CLINICAL DATA:  Initial evaluation for chronic left hand pain. EXAM: LEFT HAND - COMPLETE 3+ VIEW COMPARISON:  None available. FINDINGS: No acute fracture or dislocation. Extensive degenerative osteoarthritic changes present throughout the joints of the hand, most notable at the first [facility] and MCP joints. Diffuse osteopenia. No soft tissue abnormality. IMPRESSION: 1. No acute osseous abnormality about the left hand. 2. Extensive degenerative osteoarthritic changes throughout the joints of the hand, most notable at the first the [facility] and MCP joints. 3. Diffuse osteopenia. Electronically Signed   By: Jeannine Boga M.D.   On: 05/07/2016 23:15    Procedures Procedures (including critical care time)  Medications Ordered in ED Medications  acetaminophen (TYLENOL) 325 MG tablet (not administered)  furosemide (LASIX) injection 40 mg (not administered)  acetaminophen (TYLENOL) tablet 650 mg (650 mg Oral Given 05/07/16 1809)   Initial Impression / Assessment and Plan / ED Course  I have reviewed the triage vital signs and the nursing notes.  Pertinent labs & imaging results that were available during my care of the patient were reviewed by me and considered in my medical decision making (see chart for details).  Final Clinical Impressions(s) / ED Diagnoses  {I have reviewed and evaluated the relevant laboratory values.   {I have reviewed the relevant previous healthcare records.  {I obtained HPI from historian.   ED Course:  Assessment: Pt is a 76 y.o. female with hx atrial fibrillation on Coumadin status post MAZEprocedure with bioprosthetic mitral valve replacement, chronic respiratory failure with hypoxia, chronic combined CHF, CKD stage 3 who presents with generalized fatigue, shortness of breath and decrease in ADLs. Admitted recently for low Hgb. DCed on 05-03-16. GI saw during hospitalization due to positive stool. Endoscopy/Colonoscopy unremarkable. Started on  PPI daily. Given iron supplementation. Anemia thought to be related to chronic disease as well as blood loss. Daughter notes difficulty caring for at home since DC. On exam, pt in NAD. Nontoxic/nonseptic appearing. VSS. Afebrile. Lungs CTA. Heart RRR. Abdomen nontender soft. 3+ pitting edema noted above knees. CBC with Hgb improved at 8.9. CMP unremarkable. Trop negative. BNP 1,684. CXR with interstitial edema. Given 40 Lasix in ED. Plan is to [facility].   04-09-16 Echocardiogram- EF: 35-40%  Disposition/Plan:  Admit Pt acknowledges  and agrees with plan  Supervising Physician Blanchie Dessert, MD  Final diagnoses:  Acute on chronic congestive heart failure, unspecified congestive heart failure type ([place])  Weakness    New Prescriptions New Prescriptions   No medications on file     Shary Decamp, PA-C 05/07/16 [place], MD 05/09/16 [id number]

## 2016-05-07 NOTE — ED Notes (Signed)
Patient asking about wait times.  Made aware of delays and apologized for wait.  Patient's daughter states she is having difficulty remaining in the wheelchair.  Will speak to charge about options.

## 2016-05-07 NOTE — ED Notes (Signed)
ED Provider at bedside. 

## 2016-05-07 NOTE — ED Triage Notes (Signed)
Pt presents to ED with daughter.  Patient not speaking much for herself.  Daughter explains she was released from [facility] last week for a low hemoglobin.  They were unable to find the source of the bleeding, and patient was sent home on her coumadin.  Pt's daughter is concerned about this being the correct choice.  Pt's daughter states she is also having left hand and left knee pain, which is chronic for this patient.  She would like her hemoglobin checked, and would like assistance in seeking 24 hour care.  Patient's daughter states she has become difficult to care for at home.

## 2016-05-07 NOTE — ED Notes (Signed)
This RN attempted IV X2, unsuccessful

## 2016-05-08 ENCOUNTER — Inpatient Hospital Stay (HOSPITAL_COMMUNITY): Payer: Medicare HMO

## 2016-05-08 ENCOUNTER — Encounter (HOSPITAL_COMMUNITY): Payer: Self-pay | Admitting: Family Medicine

## 2016-05-08 DIAGNOSIS — I5043 Acute on chronic combined systolic (congestive) and diastolic (congestive) heart failure: Secondary | ICD-10-CM

## 2016-05-08 LAB — CBC
HEMATOCRIT: 28.2 % — AB (ref 36.0–46.0)
HEMOGLOBIN: 8.7 g/dL — AB (ref 12.0–15.0)
MCH: 31.5 pg (ref 26.0–34.0)
MCHC: 30.9 g/dL (ref 30.0–36.0)
MCV: 102.2 fL — ABNORMAL HIGH (ref 78.0–100.0)
Platelets: 138 10*3/uL — ABNORMAL LOW (ref 150–400)
RBC: 2.76 MIL/uL — ABNORMAL LOW (ref 3.87–5.11)
RDW: 17.4 % — AB (ref 11.5–15.5)
WBC: 3.6 10*3/uL — AB (ref 4.0–10.5)

## 2016-05-08 LAB — PROTIME-INR
INR: 2.81
PROTHROMBIN TIME: 30.2 s — AB (ref 11.4–15.2)

## 2016-05-08 MED ORDER — WARFARIN SODIUM 2.5 MG PO TABS
2.5000 mg | ORAL_TABLET | Freq: Once | ORAL | Status: AC
  Administered 2016-05-08: 2.5 mg via ORAL
  Filled 2016-05-08: qty 1

## 2016-05-08 MED ORDER — PANTOPRAZOLE SODIUM 40 MG PO TBEC
40.0000 mg | DELAYED_RELEASE_TABLET | Freq: Every day | ORAL | Status: DC
  Administered 2016-05-08 – 2016-05-11 (×4): 40 mg via ORAL
  Filled 2016-05-08 (×4): qty 1

## 2016-05-08 MED ORDER — VITAMIN B-12 1000 MCG PO TABS
1000.0000 ug | ORAL_TABLET | Freq: Every day | ORAL | Status: DC
  Administered 2016-05-08 – 2016-05-11 (×4): 1000 ug via ORAL
  Filled 2016-05-08 (×4): qty 1

## 2016-05-08 MED ORDER — WARFARIN - PHARMACIST DOSING INPATIENT: Freq: Every day | Status: DC

## 2016-05-08 MED ORDER — ACETAMINOPHEN 325 MG PO TABS
650.0000 mg | ORAL_TABLET | ORAL | Status: DC | PRN
  Administered 2016-05-08 – 2016-05-10 (×3): 650 mg via ORAL
  Filled 2016-05-08 (×3): qty 2

## 2016-05-08 MED ORDER — POTASSIUM CHLORIDE CRYS ER 20 MEQ PO TBCR
40.0000 meq | EXTENDED_RELEASE_TABLET | Freq: Every day | ORAL | Status: DC
  Administered 2016-05-08 – 2016-05-11 (×4): 40 meq via ORAL
  Filled 2016-05-08 (×4): qty 2

## 2016-05-08 MED ORDER — AMIODARONE HCL 100 MG PO TABS
100.0000 mg | ORAL_TABLET | Freq: Every day | ORAL | Status: DC
  Administered 2016-05-08 – 2016-05-11 (×4): 100 mg via ORAL
  Filled 2016-05-08 (×4): qty 1

## 2016-05-08 MED ORDER — TRAMADOL HCL 50 MG PO TABS
25.0000 mg | ORAL_TABLET | ORAL | Status: DC | PRN
  Administered 2016-05-08 – 2016-05-09 (×6): 50 mg via ORAL
  Filled 2016-05-08 (×7): qty 1

## 2016-05-08 MED ORDER — SIMVASTATIN 10 MG PO TABS
10.0000 mg | ORAL_TABLET | Freq: Every evening | ORAL | Status: DC
  Administered 2016-05-08 – 2016-05-10 (×3): 10 mg via ORAL
  Filled 2016-05-08 (×3): qty 1

## 2016-05-08 MED ORDER — COLCHICINE 0.6 MG PO TABS
0.3000 mg | ORAL_TABLET | Freq: Every day | ORAL | Status: DC
  Administered 2016-05-08 – 2016-05-11 (×4): 0.3 mg via ORAL
  Filled 2016-05-08 (×4): qty 1

## 2016-05-08 MED ORDER — LEVOTHYROXINE SODIUM 50 MCG PO TABS
50.0000 ug | ORAL_TABLET | Freq: Every day | ORAL | Status: DC
  Administered 2016-05-08 – 2016-05-11 (×3): 50 ug via ORAL
  Filled 2016-05-08 (×4): qty 1

## 2016-05-08 MED ORDER — FERROUS SULFATE 325 (65 FE) MG PO TABS
325.0000 mg | ORAL_TABLET | Freq: Two times a day (BID) | ORAL | Status: DC
  Administered 2016-05-08 – 2016-05-11 (×8): 325 mg via ORAL
  Filled 2016-05-08 (×8): qty 1

## 2016-05-08 MED ORDER — FUROSEMIDE 10 MG/ML IJ SOLN
40.0000 mg | Freq: Two times a day (BID) | INTRAMUSCULAR | Status: DC
  Administered 2016-05-08 – 2016-05-11 (×8): 40 mg via INTRAVENOUS
  Filled 2016-05-08 (×8): qty 4

## 2016-05-08 MED ORDER — FESOTERODINE FUMARATE ER 4 MG PO TB24
4.0000 mg | ORAL_TABLET | Freq: Every day | ORAL | Status: DC
  Administered 2016-05-09 – 2016-05-11 (×3): 4 mg via ORAL
  Filled 2016-05-08 (×4): qty 1

## 2016-05-08 NOTE — Progress Notes (Signed)
PROGRESS NOTE                                                                                                                                                                                                             Patient Demographics:    [name] [name], is a 76 y.o. female, DOB - 03-13-1940, [id number]  Admit date - 05/07/2016   Admitting Physician Edwin Dada, MD  Outpatient Primary MD for the patient is [name], MD  LOS - 1  Chief Complaint  Patient presents with  . multiple complaints       Brief Narrative  [name] [name] is a 76 y.o. female with a past medical history significant for CHF EF 35% on home O2, Afib s/p MAZE on warfarin, bioprosthetic MVR, and HTN who presents with weakness. In the ER found to be in acute on chronic systolic CHF.   Subjective:    [name] [name] today has, No headache, No chest pain, No abdominal pain - No Nausea, No new weakness tingling or numbness, No Cough - Improved SOB.    Assessment  & Plan :     1.Acute on chronic combined diastolic and systolic CHF EF [id number] on recent echo. Placed on IV Lasix, heart rate too low for beta blocker and renal function does not allow ACE/ARB, placed on salt and fluid restriction, continue supportive care with oxygen and nebulizer treatment as needed. She is clinically already feeling better will continue the same. Daily weights, monitor intake output.  Filed Weights   05/08/16 0125 05/08/16 0716  Weight: 78 kg (172 lb) 77.2 kg (170 lb 3.2 oz)    2. Chronic A. fib [place] vasc 2 score of 5. On amiodarone Coumadin continue pharmacy monitoring INR.  3. History of chronic arthritis, recent left wrist flare. On colchicine continue continue left wrist splint.  4. Hypothyroidism. Continue Synthroid. PCP to monitor thyroid function in the light of amiodarone.  5. Macrocytic anemia. B-12 borderline low, agree with oral  supplementation. Outpatient follow with PCP. Note patient's B-12 was over 2000 about 2 years ago.  6. GERD. On PPI  7. Dyslipidemia. On statin continue.    Diet : Diet Heart Room service appropriate? Yes; Fluid consistency: Thin; Fluid restriction: 1500 mL Fluid    Family Communication  :  None  Code Status :  Full  Disposition Plan  :  TBD  Consults  :  Cards  Procedures  :  None  TTE feb 2018   Left ventricle: Poor acoustic windows limit study Endocardium is diffficult to see. Hypokinesis appears to be worse in the  inferior/inferoseptal walls. The cavity size was normal. Wall thickness was increased in a pattern of mild LVH. Systolic function was moderately reduced. The estimated ejection fraction was in the range of 35% to 40%. - Aortic valve: AV is functionally bicuspid It is thickened, calcified Peak and mean gradients through the valve are 18 and 11   mm Hg respectively. There was moderate regurgitation. - Mitral valve: MV prosthesis is well seated. Difficult to see leaflets well. Peak and mean gradients through the valve are 12 and 3 mm Hg respectively MVA by P T1/2 is 2 cm2. Valve area by continuity equation (using LVOT flow): 2.19 cm^2. - Left atrium: The atrium was mildly dilated. - Right ventricle: Systolic function was mildly to moderately  reduced. - Right atrium: The atrium was mildly dilated. - Pulmonary arteries: PA peak pressure: 66 mm Hg (S).  Impressions:  Compared to report from 2016, LVEF is now depressed.  DVT Prophylaxis  :  Coumadin  Lab Results  Component Value Date   INR 2.81 05/08/2016   INR 1.30 05/04/2016   INR 1.57 05/01/2016     Lab Results  Component Value Date   PLT 138 (L) 05/08/2016    Inpatient Medications  Scheduled Meds: . amiodarone  100 mg Oral Daily  . colchicine  0.3 mg Oral Daily  . ferrous sulfate  325 mg Oral BID WC  . fesoterodine  4 mg Oral Daily  . furosemide  40 mg Intravenous BID  . levothyroxine  50 mcg Oral  QAC breakfast  . pantoprazole  40 mg Oral Daily  . potassium chloride  40 mEq Oral Daily  . simvastatin  10 mg Oral QPM  . vitamin B-12  1,000 mcg Oral Daily  . warfarin  2.5 mg Oral ONCE-1800  . Warfarin - Pharmacist Dosing Inpatient   Does not apply q1800   Continuous Infusions: PRN Meds:.acetaminophen, traMADol  Antibiotics  :    Anti-infectives    None         Objective:   Vitals:   05/07/16 2314 05/07/16 2345 05/08/16 0125 05/08/16 0716  BP:  141/64 (!) 155/58 (!) 144/58  Pulse: (!) 59 (!) 59 61 62  Resp: 14 15 16 18   Temp:   97.6 F (36.4 C) 97.5 F (36.4 C)  TempSrc:   Oral Oral  SpO2: 96% 100% 97% 97%  Weight:   78 kg (172 lb) 77.2 kg (170 lb 3.2 oz)  Height:   5' (1.524 m)     Wt Readings from Last 3 Encounters:  05/08/16 77.2 kg (170 lb 3.2 oz)  04/29/16 76.2 kg (168 lb)  04/20/16 76.2 kg (168 lb)     Intake/Output Summary (Last 24 hours) at 05/08/16 1229 Last data filed at 05/08/16 1200  Gross per 24 hour  Intake              480 ml  Output                2 ml  Net              478 ml     Physical Exam  Awake Alert, Oriented X 3, No new F.N deficits, Normal affect [place].AT,PERRAL Supple Neck,No JVD, No cervical lymphadenopathy  appriciated.  Symmetrical Chest wall movement, Good air movement bilaterally, few rales RRR,No Gallops,Rubs or new Murmurs, No Parasternal Heave +ve B.Sounds, Abd Soft, No tenderness, No organomegaly appriciated, No rebound - guarding or rigidity. No Cyanosis, Clubbing, 1+ edema, No new Rash or bruise   , chronic leg lichenification     Data Review:    CBC  Recent Labs Lab 05/02/16 1633 05/03/16 0558 05/04/16 0441 05/07/16 1815 05/08/16 0535  WBC 2.9* 3.4* 3.0* 3.6* 3.6*  HGB 7.8* 7.9* 7.9* 8.9* 8.7*  HCT 24.7* 25.0* 25.0* 29.4* 28.2*  PLT 121* 125* 127* 165 138*  MCV 101.6* 102.9* 103.3* 102.8* 102.2*  MCH 32.1 32.5 32.6 31.1 31.5  MCHC 31.6 31.6 31.6 30.3 30.9  RDW 17.0* 17.2* 17.1* 17.3* 17.4*     Chemistries   Recent Labs Lab 05/02/16 1633 05/03/16 0558 05/07/16 1815  NA 136 134* 136  K 4.4 4.0 4.3  CL 99* 98* 100*  CO2 31 31 27   GLUCOSE 127* 94 96  BUN 49* 47* 40*  CREATININE 2.12* 2.11* 1.90*  CALCIUM 8.7* 8.6* 9.1  AST  --   --  23  ALT  --   --  11*  ALKPHOS  --   --  116  BILITOT  --   --  0.7   ------------------------------------------------------------------------------------------------------------------ No results for input(s): CHOL, HDL, LDLCALC, TRIG, CHOLHDL, LDLDIRECT in the last 72 hours.  Lab Results  Component Value Date   HGBA1C 5.6 04/09/2016   ------------------------------------------------------------------------------------------------------------------ No results for input(s): TSH, T4TOTAL, T3FREE, THYROIDAB in the last 72 hours.  Invalid input(s): FREET3 ------------------------------------------------------------------------------------------------------------------ No results for input(s): VITAMINB12, FOLATE, FERRITIN, TIBC, IRON, RETICCTPCT in the last 72 hours.  Coagulation profile  Recent Labs Lab 05/04/16 0441 05/08/16 0535  INR 1.30 2.81    No results for input(s): DDIMER in the last 72 hours.  Cardiac Enzymes No results for input(s): CKMB, TROPONINI, MYOGLOBIN in the last 168 hours.  Invalid input(s): CK ------------------------------------------------------------------------------------------------------------------    Component Value Date/Time   BNP 1,684.5 (H) 05/07/2016 2152    Micro Results No results found for this or any previous visit (from the past 240 hour(s)).  Radiology Reports Dg Chest 2 View  Result Date: 05/07/2016 CLINICAL DATA:  Initial evaluation for hip acute shortness of breath. EXAM: CHEST  2 VIEW COMPARISON:  Prior radiograph from 07/30/2015. FINDINGS: Moderate cardiomegaly, stable. Prostatic bowel noted. Mediastinal silhouette within normal limits. Aortic atherosclerosis. Lungs normally  inflated. Diffuse pulmonary vascular congestion with interstitial prominence, compatible with pulmonary interstitial edema. No focal infiltrates identified. No significant pleural effusion. No pneumothorax. No acute osseous abnormality. Remotely healed left-sided rib fractures noted. Diffuse osteopenia. IMPRESSION: 1. Cardiomegaly with mild to moderate diffuse pulmonary interstitial edema. 2. Aortic atherosclerosis. Electronically Signed   By: Jeannine Boga M.D.   On: 05/07/2016 23:10   Ct Head Wo Contrast  Result Date: 04/09/2016 CLINICAL DATA:  Weakness and left lower extremity.  Now resolved. EXAM: CT HEAD WITHOUT CONTRAST TECHNIQUE: Contiguous axial images were obtained from the base of the skull through the vertex without intravenous contrast. COMPARISON:  Head CT 11/15/2013 FINDINGS: Brain: No evidence of acute infarction, hemorrhage, hydrocephalus, extra-axial collection or mass lesion/mass effect. Moderate cerebral atrophy and chronic small vessel ischemia. Remote lacunar infarcts in the basal ganglia bilaterally. Vascular: Atherosclerosis of skullbase vasculature without hyperdense vessel or abnormal calcification. Skull: Normal. Negative for fracture or focal lesion. Sinuses/Orbits: Paranasal sinuses and mastoid air cells are clear. The visualized orbits are unremarkable. Bilateral cataract resection. Other: None.  IMPRESSION: No acute intracranial abnormality. Atrophy, remote and chronic small vessel ischemic change. Electronically Signed   By: Jeb Levering M.D.   On: 04/09/2016 03:07   Mr Brain Wo Contrast  Result Date: 04/09/2016 CLINICAL DATA:  76 year old female with altered mental status. Weakness and recent fall. Initial encounter. EXAM: MRI HEAD WITHOUT CONTRAST TECHNIQUE: Multiplanar, multiecho pulse sequences of the brain and surrounding structures were obtained without intravenous contrast. COMPARISON:  Head CT without contrast 0244 hours today and earlier. FINDINGS: Brain: No  restricted diffusion to suggest acute infarction. No midline shift, mass effect, evidence of mass lesion, ventriculomegaly, extra-axial collection or acute intracranial hemorrhage. Cervicomedullary junction and pituitary are within normal limits. Patchy periventricular white matter T2 and FLAIR hyperintensity. Moderate T2 heterogeneity throughout the bilateral basal ganglia and thalami, but at least in part related to perivascular spaces. Small chronic lacunar infarct in the left cerebellum (series 7, image 5). Minimal T2 heterogeneity in the left pons. No cortical encephalomalacia identified. There are occasional chronic micro hemorrhages in the brain, including the left occipital lobe (series 10, image 7). Vascular: Major intracranial vascular flow voids are preserved. Persistent right trigeminal artery, better demonstrated on today MRA. Skull and upper cervical spine: Negative. Sinuses/Orbits: Postoperative changes to both globes, otherwise negative. Other: Visible internal auditory structures appear normal. Mild left mastoid effusion is stable since 2015. Negative nasopharynx. Negative scalp soft tissues. IMPRESSION: 1. No acute intracranial abnormality. 2. Moderate for age signal changes, most pronounced in the cerebral white matter and deep gray matter nuclei, most compatible with chronic small vessel disease. Electronically Signed   By: Genevie Ann M.D.   On: 04/09/2016 09:06   US Carotid Bilateral (at [facility] And [facility] Only)  Result Date: 04/09/2016 CLINICAL DATA:  Left lower extremity weakness. EXAM: BILATERAL CAROTID DUPLEX ULTRASOUND TECHNIQUE: Pearline Cables scale imaging, color Doppler and duplex ultrasound were performed of bilateral carotid and vertebral arteries in the neck. COMPARISON:  CT 04/09/2016 . FINDINGS: Criteria: Quantification of carotid stenosis is based on velocity parameters that correlate the residual internal carotid diameter with NASCET-based stenosis levels, using the diameter of the distal internal  carotid lumen as the denominator for stenosis measurement. The following velocity measurements were obtained: RIGHT ICA:  65/14 cm/sec CCA:  [id number] cm/sec SYSTOLIC ICA/CCA RATIO:  0.7 DIASTOLIC ICA/CCA RATIO:  1.4 ECA:  141 cm/sec LEFT ICA:  54/8 cm/sec CCA:  [id number] cm/sec SYSTOLIC ICA/CCA RATIO:  0.7 DIASTOLIC ICA/CCA RATIO:  0.7 ECA:  126 cm/sec RIGHT CAROTID ARTERY: Mild right carotid bifurcation /proximal ICA atherosclerotic vascular disease. No flow limiting stenosis. RIGHT VERTEBRAL ARTERY:  Patent with antegrade flow. LEFT CAROTID ARTERY: Mild left distal common carotid/carotid bifurcation atherosclerotic-disease. No flow limiting stenosis . LEFT VERTEBRAL ARTERY:  Patent with antegrade flow. IMPRESSION: 1. Mild right carotid bifurcation/ proximal ICA atherosclerotic vascular disease. No flow limiting stenosis. Degree of stenosis less than 50%. 2. Mild left distal common carotid/ carotid bifurcation atherosclerotic vascular disease. No flow limiting stenosis. Degree of stenosis less than 50%. 3.  Vertebral arteries are patent antegrade flow. Electronically Signed   By: Marcello Moores  Register   On: 04/09/2016 08:57   Dg Chest Port 1 View  Result Date: 05/08/2016 CLINICAL DATA:  Shortness of breath. EXAM: PORTABLE CHEST 1 VIEW COMPARISON:  Radiograph of May 07, 2016. FINDINGS: Stable cardiomegaly. Status post cardiac valve repair. No pneumothorax or significant pleural effusion is noted. Mild central pulmonary vascular congestion is noted. No consolidative process is noted. Bony thorax is unremarkable. IMPRESSION: Stable cardiomegaly and central  pulmonary vascular congestion. Electronically Signed   By: Marijo Conception, M.D.   On: 05/08/2016 08:00   Dg Knee Complete 4 Views Left  Result Date: 05/07/2016 CLINICAL DATA:  Initial evaluation for chronic left knee pain. EXAM: LEFT KNEE - COMPLETE 4+ VIEW COMPARISON:  None. FINDINGS: Diffuse osteopenia, somewhat limiting evaluation for possible subtle acute  nondisplaced fractures. No acute fracture or dislocation identified. Severe tricompartmental degenerative osteoarthrosis. No significant joint effusion. No soft tissue abnormality. IMPRESSION: 1. No acute osseous abnormality about the knee. 2. Severe tricompartmental degenerative osteoarthrosis. 3. Diffuse osteopenia. Electronically Signed   By: Jeannine Boga M.D.   On: 05/07/2016 23:12   Dg Hand Complete Left  Result Date: 05/07/2016 CLINICAL DATA:  Initial evaluation for chronic left hand pain. EXAM: LEFT HAND - COMPLETE 3+ VIEW COMPARISON:  None available. FINDINGS: No acute fracture or dislocation. Extensive degenerative osteoarthritic changes present throughout the joints of the hand, most notable at the first [facility] and MCP joints. Diffuse osteopenia. No soft tissue abnormality. IMPRESSION: 1. No acute osseous abnormality about the left hand. 2. Extensive degenerative osteoarthritic changes throughout the joints of the hand, most notable at the first the [facility] and MCP joints. 3. Diffuse osteopenia. Electronically Signed   By: Jeannine Boga M.D.   On: 05/07/2016 23:15   Mr Jodene Nam Head/brain [id number] Cm  Result Date: 04/09/2016 CLINICAL DATA:  76 year old female with altered mental status. Weakness and recent fall. Initial encounter. EXAM: MRA HEAD WITHOUT CONTRAST TECHNIQUE: Angiographic images of the Circle of Willis were obtained using MRA technique without intravenous contrast. COMPARISON:  Brain MRI from today reported separately. Carotid Doppler ultrasound from today. FINDINGS: Antegrade flow in the posterior circulation. Mildly dominant distal left vertebral artery. No distal vertebral stenosis. The right PICA origin is normal. Patent vertebrobasilar junction. Persistent right side trigeminal artery (normal anatomic variant). SCA and PCA origins are normal. Posterior communicating arteries are diminutive or absent. Bilateral PCA branches are within normal limits. Antegrade flow in both ICA siphons.  Mild siphon irregularity greater on the left with no associated siphon stenosis. Normal ophthalmic artery origins. Normal carotid termini, MCA and ACA origins. Diminutive or absent anterior communicating artery. Visualized ACA branches are normal. MCA M1 segments, bifurcations, and visualized bilateral MCA branches are within normal limits. IMPRESSION: 1.  Negative intracranial MRA. 2. Persistent right trigeminal artery, an unusual normal anatomic variation. Electronically Signed   By: Genevie Ann M.D.   On: 04/09/2016 09:04    Time Spent in minutes  30   SINGH,PRASHANT K M.D on 05/08/2016 at 12:29 PM  Between 7am to 7pm - Pager - [phone]  After 7pm go to [email or web address] - password [facility]  [facility] -  Office  [phone]

## 2016-05-08 NOTE — Progress Notes (Signed)
ANTICOAGULATION CONSULT NOTE - Follow Up Consult  Pharmacy Consult for Warfarin Indication: atrial fibrillation  Assessment: [name] [name] admitted 3/3 for dyspnea and weakness on warfarin prior to admission for Afib s/p MAZE and bioprosthetic MVR. INR goal 2-3 per outpatient anticoagulant visits. Home dose was 2.5 mg daily with an INR 2.81 (therapeutic) on admission. Hemoglobin and platelets are low but stable. No overt bleeding noted.  Goal of Therapy:  INR 2-3 Monitor platelets by anticoagulation protocol: Yes   Plan:  Warfarin 2.5 mg x 1 dose tonight Monitor daily INR and s/sx bleeding   Allergies  Allergen Reactions  . Indomethacin Other (See Comments)    dizziness  . Norvasc [Amlodipine Besylate] Cough    Patient Measurements: Height: 5' (152.4 cm) Weight: 170 lb 3.2 oz (77.2 kg) IBW/kg (Calculated) : 45.5  Vital Signs: Temp: 97.5 F (36.4 C) (03/03 0716) Temp Source: Oral (03/03 0716) BP: 144/58 (03/03 0716) Pulse Rate: 62 (03/03 0716)  Labs:  Recent Labs  05/07/16 1815 05/08/16 0535  HGB 8.9* 8.7*  HCT 29.4* 28.2*  PLT 165 138*  LABPROT  --  30.2*  INR  --  2.81  CREATININE 1.90*  --     Estimated Creatinine Clearance: 23.5 mL/min (by C-G formula based on SCr of 1.9 mg/dL (H)).  Medications:  Scheduled:  . amiodarone  100 mg Oral Daily  . colchicine  0.3 mg Oral Daily  . ferrous sulfate  325 mg Oral BID WC  . fesoterodine  4 mg Oral Daily  . furosemide  40 mg Intravenous BID  . levothyroxine  50 mcg Oral QAC breakfast  . pantoprazole  40 mg Oral Daily  . potassium chloride  40 mEq Oral Daily  . simvastatin  10 mg Oral QPM  . vitamin B-12  1,000 mcg Oral Daily  . warfarin  2.5 mg Oral ONCE-1800  . Warfarin - Pharmacist Dosing Inpatient   Does not apply [id number]    Belia Heman, PharmD PGY1 Pharmacy Resident [phone] (Pager) 05/08/2016 11:11 AM

## 2016-05-08 NOTE — Evaluation (Signed)
Physical Therapy Evaluation Patient Details Name: [name] [name] MRN: [id number] DOB: Jul 24, 1940 Today's Date: 05/08/2016   History of Present Illness  PT is a 76 y/o female admitted secondary to dyspnea and weakness. PMH including but not limited to CHF, COPD (on 2L of supplemental O2 at all times), CKD, HTN, hx of CVA in 2000, a-fib s/p MAZE procedure in 2015, s/p cardioversion in 2015 and s/p MVR in 2015.  Clinical Impression  Pt presented supine in bed with HOB elevated, awake and willing to participate in therapy session. Prior to admission, pt reported that she was mod I with functional mobility with use of rollator to ambulate and independent with ADLs. No family members or caregivers present to confirm information provided by patient. Pt able to perform bed mobility with min guard and required mod A for sit-to-stand from bed with RW. Pt refusing to ambulate at this time secondary to reports of L knee pain (chronic). Pt would continue to benefit from skilled physical therapy services at this time while admitted and after d/c to address her below listed limitations in order to improve her overall safety and independence with functional mobility.      Follow Up Recommendations SNF;Supervision/Assistance - 24 hour    Equipment Recommendations  None recommended by PT    Recommendations for Other Services       Precautions / Restrictions Precautions Precautions: Fall Restrictions Weight Bearing Restrictions: No      Mobility  Bed Mobility Overal bed mobility: Needs Assistance Bed Mobility: Supine to Sit     Supine to sit: Min guard;HOB elevated     General bed mobility comments: increased time, use of bed rails, HOB significantly elevated, min guard for safety  Transfers Overall transfer level: Needs assistance Equipment used: Rolling walker (2 wheeled) Transfers: Sit to/from Stand Sit to Stand: Mod assist         General transfer comment: increased time, VC'ing  for bilateral hand placement but pt pulling up on RW despite cues, mod A to rise into standing from bed  Ambulation/Gait             General Gait Details: pt refusing to ambulate at this time secondary to L knee pain (chronic - pt reported that she needs to have TKA)  Stairs            Wheelchair Mobility    Modified Rankin (Stroke Patients Only)       Balance Overall balance assessment: Needs assistance Sitting-balance support: Feet supported Sitting balance-[name] Scale: Fair Sitting balance - Comments: pt able to sit EOB with min guard   Standing balance support: Bilateral upper extremity supported Standing balance-[name] Scale: Poor Standing balance comment: pt reliant on bilateral UEs on RW                             Pertinent Vitals/Pain Pain Assessment: Faces Faces Pain Scale: Hurts little more Pain Location: L knee Pain Descriptors / Indicators: Sore;Discomfort Pain Intervention(s): Monitored during session;Repositioned;Limited activity within patient's tolerance    Home Living Family/patient expects to be discharged to:: Private residence Living Arrangements: Alone Available Help at Discharge: Family;Available PRN/intermittently Type of Home: House Home Access: Ramped entrance     Home Layout: One level Home Equipment: Walker - 2 wheels;Walker - 4 wheels;Hospital bed      Prior Function Level of Independence: Independent with assistive device(s)   Gait / Transfers Assistance Needed: pt reported that she was able  to ambulate within her home with use of rollator  ADL's / Homemaking Assistance Needed: pt stated that she performed bathing ("bird baths") and dressing independently  Comments: no family or caregivers present to confirm information provided     Hand Dominance        Extremity/Trunk Assessment   Upper Extremity Assessment Upper Extremity Assessment: Generalized weakness    Lower Extremity Assessment Lower Extremity  Assessment: Generalized weakness    Cervical / Trunk Assessment Cervical / Trunk Assessment: Kyphotic  Communication   Communication: No difficulties  Cognition Arousal/Alertness: Awake/alert Behavior During Therapy: WFL for tasks assessed/performed Overall Cognitive Status: Within Functional Limits for tasks assessed                      General Comments      Exercises     Assessment/Plan    PT Assessment Patient needs continued PT services  PT Problem List Decreased strength;Decreased range of motion;Decreased activity tolerance;Decreased balance;Decreased mobility;Decreased coordination;Decreased knowledge of use of DME;Decreased safety awareness;Cardiopulmonary status limiting activity;Pain       PT Treatment Interventions DME instruction;Gait training;Functional mobility training;Therapeutic activities;Therapeutic exercise;Balance training;Patient/family education;Neuromuscular re-education    PT Goals (Current goals can be found in the Care Plan section)  Acute Rehab PT Goals Patient Stated Goal: return home PT Goal Formulation: With patient Time For Goal Achievement: 05/22/16 Potential to Achieve Goals: Fair    Frequency Min 3X/week   Barriers to discharge Decreased caregiver support      Co-evaluation               End of Session Equipment Utilized During Treatment: Gait belt;Oxygen Activity Tolerance: Patient limited by pain;Patient limited by fatigue Patient left: in bed;with call bell/phone within reach;with bed alarm set;Other (comment) (sitting EOB to eat lunch) Nurse Communication: Mobility status PT Visit Diagnosis: Other abnormalities of gait and mobility (R26.89);Pain Pain - Right/Left: Left Pain - part of body: Knee         Time: [id number] PT Time Calculation (min) (ACUTE ONLY): 21 min   Charges:   PT Evaluation $PT Eval Moderate Complexity: 1 Procedure     PT G CodesClearnce Sorrel Sonda Coppens 05/08/2016, 2:02  PM Sherie Don, [place], DPT [phone]

## 2016-05-08 NOTE — Progress Notes (Signed)
ANTICOAGULATION CONSULT NOTE - Initial Consult  Pharmacy Consult for Warfarin  Indication: atrial fibrillation  Allergies  Allergen Reactions  . Indomethacin Other (See Comments)    dizziness  . Norvasc [Amlodipine Besylate] Cough   Patient Measurements: Height: 5' (152.4 cm) Weight: 172 lb (78 kg) IBW/kg (Calculated) : 45.5  Vital Signs: Temp: 97.6 F (36.4 C) (03/03 0125) Temp Source: Oral (03/03 0125) BP: 155/58 (03/03 0125) Pulse Rate: 61 (03/03 0125)  Labs:  Recent Labs  05/07/16 1815  HGB 8.9*  HCT 29.4*  PLT 165  CREATININE 1.90*    Estimated Creatinine Clearance: 23.6 mL/min (by C-G formula based on SCr of 1.9 mg/dL (H)).   Medical History: Past Medical History:  Diagnosis Date  . Aortic insufficiency due to bicuspid aortic valve    Moderate by TEE  . Arthritis   . Chronic diastolic congestive heart failure ([place])    a) ECHO (09/2013) EF 50-55%  . Chronic respiratory failure with hypoxia (HCC) 04/10/2016   On 2 L nasal cannula oxygen chronically.  . CKD (chronic kidney disease) stage 4, GFR 15-29 ml/min (HCC)   . COPD (chronic obstructive pulmonary disease) ([place])   . Essential hypertension, benign   . History of stroke    2000 [place]  . History of TIA (transient ischemic attack)    Diagnosed 2003  . Hyperlipidemia   . Juvenile rheumatic fever    Age 76  . 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 NICM (nonischemic cardiomyopathy) ([place])    a) LHC (04/2013) no significant CAD  . Obesity   . Paroxysmal atrial fibrillation (HCC)    s/p MAZE (05/2013)  . Pulmonary hypertension 04/10/2016   Peak pressure 66 mmHg, Echo 04/09/2016  . S/P Maze operation for atrial fibrillation 05/16/2013   Complete bilateral atrial lesion set using cryothermy and bipolar radiofrequency ablation with oversewing of LA appendage  . S/P minimally invasive mitral valve replacement with bioprosthetic valve and maze procedure 05/16/2013   27 mm Edwards magna  mitral bovine bioprosthetic tissue valve placed via right thoracotomy  . Systolic dysfunction without heart failure 04/10/2016   EF 35-40%, ECHO 04/09/2016    Assessment: 76 y/o F here with concern over low Hgb from previous admission, no bleeding noted, Hgb 8.9 today, no INR yet  Warfarin PTA dosing: 2.5 mg daily  Goal of Therapy:  INR 2-3 Monitor platelets by anticoagulation protocol: Yes   Plan:  -Await INR with AM labs to assess dosing needs  [name] [name] 05/08/2016,1:32 AM

## 2016-05-09 LAB — BASIC METABOLIC PANEL
Anion gap: 6 (ref 5–15)
BUN: 34 mg/dL — AB (ref 6–20)
CHLORIDE: 102 mmol/L (ref 101–111)
CO2: 32 mmol/L (ref 22–32)
CREATININE: 1.65 mg/dL — AB (ref 0.44–1.00)
Calcium: 9.2 mg/dL (ref 8.9–10.3)
GFR calc Af Amer: 34 mL/min — ABNORMAL LOW (ref >60)
GFR calc non Af Amer: 29 mL/min — ABNORMAL LOW (ref >60)
GLUCOSE: 79 mg/dL (ref 65–99)
POTASSIUM: 3.7 mmol/L (ref 3.5–5.1)
SODIUM: 140 mmol/L (ref 135–145)

## 2016-05-09 LAB — PROTIME-INR
INR: 3.22
Prothrombin Time: 33.6 seconds — ABNORMAL HIGH (ref 11.4–15.2)

## 2016-05-09 LAB — MAGNESIUM: Magnesium: 2.2 mg/dL (ref 1.7–2.4)

## 2016-05-09 MED ORDER — WARFARIN SODIUM 2 MG PO TABS
1.0000 mg | ORAL_TABLET | Freq: Once | ORAL | Status: AC
  Administered 2016-05-09: 1 mg via ORAL
  Filled 2016-05-09: qty 0.5

## 2016-05-09 NOTE — Progress Notes (Signed)
Patient was seen by the PT yesterday and recommending SNIF and supervision. MD notified. [name], patient's daughter made aware.and remind  her to call MD in the morning for an update.

## 2016-05-09 NOTE — Progress Notes (Signed)
PROGRESS NOTE                                                                                                                                                                                                             Patient Demographics:    [name] [name], is a 76 y.o. female, DOB - [birth year], [id number]  Admit date - 05/07/2016   Admitting Physician Edwin Dada, MD  Outpatient Primary MD for the patient is [name], MD  LOS - 2  Chief Complaint  Patient presents with  . multiple complaints       Brief Narrative  [name] [name] is a 76 y.o. female with a past medical history significant for CHF EF 35% on home O2, Afib s/p MAZE on warfarin, bioprosthetic MVR, and HTN who presents with weakness. In the ER found to be in acute on chronic systolic CHF.   Subjective:    [name] [name] today has, No headache, No chest pain, No abdominal pain - No Nausea, No new weakness tingling or numbness, No Cough - Improved SOB.    Assessment  & Plan :     1.Acute on chronic combined diastolic and systolic CHF EF [id number] on recent echo. Placed on IV Lasix, heart rate too low for beta blocker and renal function does not allow ACE/ARB, placed on salt and fluid restriction, continue supportive care with oxygen and nebulizer treatment as needed. She is clinically already feeling better will continue the same. Daily weights (wrong), monitor intake output.  Filed Weights   05/08/16 0125 05/08/16 0716 05/09/16 0500  Weight: 78 kg (172 lb) 77.2 kg (170 lb 3.2 oz) 79.5 kg (175 lb 4.8 oz)    2. Chronic A. fib [place] vasc 2 score of 5. On amiodarone Coumadin continue pharmacy monitoring INR.  3. History of chronic arthritis, recent left wrist flare. On colchicine continue continue left wrist splint.  4. Hypothyroidism. Continue Synthroid. PCP to monitor thyroid function in the light of amiodarone.  5. Macrocytic  anemia. B-12 borderline low, agree with oral supplementation. Outpatient follow with PCP. Note patient's B-12 was over 2000 about 2 years ago.  6. GERD. On PPI  7. Dyslipidemia. On statin continue.  8. 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    Diet : Diet Heart Room service appropriate? Yes; Fluid consistency: Thin; Fluid restriction: 1500 mL  Fluid    Family Communication  :  None  Code Status :  Full  Disposition Plan  :  SNF in am  Consults  :  Cards  Procedures  :  None  TTE feb 2018   Left ventricle: Poor acoustic windows limit study Endocardium is diffficult to see. Hypokinesis appears to be worse in the  inferior/inferoseptal walls. The cavity size was normal. Wall thickness was increased in a pattern of mild LVH. Systolic function was moderately reduced. The estimated ejection fraction was in the range of 35% to 40%. - Aortic valve: AV is functionally bicuspid It is thickened, calcified Peak and mean gradients through the valve are 18 and 11   mm Hg respectively. There was moderate regurgitation. - Mitral valve: MV prosthesis is well seated. Difficult to see leaflets well. Peak and mean gradients through the valve are 12 and 3 mm Hg respectively MVA by P T1/2 is 2 cm2. Valve area by continuity equation (using LVOT flow): 2.19 cm^2. - Left atrium: The atrium was mildly dilated. - Right ventricle: Systolic function was mildly to moderately  reduced. - Right atrium: The atrium was mildly dilated. - Pulmonary arteries: PA peak pressure: 66 mm Hg (S).  Impressions:  Compared to report from 2016, LVEF is now depressed.  DVT Prophylaxis  :  Coumadin  Lab Results  Component Value Date   INR 3.22 05/09/2016   INR 2.81 05/08/2016   INR 1.30 05/04/2016     Lab Results  Component Value Date   PLT 138 (L) 05/08/2016    Inpatient Medications  Scheduled Meds: . amiodarone  100 mg Oral Daily  . colchicine  0.3 mg Oral Daily  . ferrous sulfate  325 mg Oral BID WC  . fesoterodine  4 mg  Oral Daily  . furosemide  40 mg Intravenous BID  . levothyroxine  50 mcg Oral QAC breakfast  . pantoprazole  40 mg Oral Daily  . potassium chloride  40 mEq Oral Daily  . simvastatin  10 mg Oral QPM  . vitamin B-12  1,000 mcg Oral Daily  . warfarin  1 mg Oral ONCE-1800  . Warfarin - Pharmacist Dosing Inpatient   Does not apply q1800   Continuous Infusions: PRN Meds:.acetaminophen, traMADol  Antibiotics  :    Anti-infectives    None         Objective:   Vitals:   05/08/16 0716 05/08/16 1441 05/08/16 2046 05/09/16 0500  BP: (!) 144/58 (!) 142/60 117/60 (!) 144/50  Pulse: 62 (!) 59 (!) 58 64  Resp: 18 18 16 18   Temp: 97.5 F (36.4 C) 97.8 F (36.6 C) 97.7 F (36.5 C) 98.2 F (36.8 C)  TempSrc: Oral Oral Oral Oral  SpO2: 97% 96% 98% 90%  Weight: 77.2 kg (170 lb 3.2 oz)   79.5 kg (175 lb 4.8 oz)  Height:        Wt Readings from Last 3 Encounters:  05/09/16 79.5 kg (175 lb 4.8 oz)  04/29/16 76.2 kg (168 lb)  04/20/16 76.2 kg (168 lb)     Intake/Output Summary (Last 24 hours) at 05/09/16 1055 Last data filed at 05/09/16 [id number]  Gross per 24 hour  Intake              360 ml  Output                4 ml  Net              356  ml     Physical Exam  Awake Alert, Oriented X 3, No new F.N deficits, Normal affect [name].AT,PERRAL Supple Neck,No JVD, No cervical lymphadenopathy appriciated.  Symmetrical Chest wall movement, Good air movement bilaterally, few rales RRR,No Gallops,Rubs or new Murmurs, No Parasternal Heave +ve B.Sounds, Abd Soft, No tenderness, No organomegaly appriciated, No rebound - guarding or rigidity. No Cyanosis, Clubbing, 1+ edema, No new Rash or bruise   , chronic leg lichenification     Data Review:    CBC  Recent Labs Lab 05/02/16 1633 05/03/16 0558 05/04/16 0441 05/07/16 1815 05/08/16 0535  WBC 2.9* 3.4* 3.0* 3.6* 3.6*  HGB 7.8* 7.9* 7.9* 8.9* 8.7*  HCT 24.7* 25.0* 25.0* 29.4* 28.2*  PLT 121* 125* 127* 165 138*  MCV 101.6* 102.9*  103.3* 102.8* 102.2*  MCH 32.1 32.5 32.6 31.1 31.5  MCHC 31.6 31.6 31.6 30.3 30.9  RDW 17.0* 17.2* 17.1* 17.3* 17.4*    Chemistries   Recent Labs Lab 05/02/16 1633 05/03/16 0558 05/07/16 1815 05/09/16 0346  NA 136 134* 136 140  K 4.4 4.0 4.3 3.7  CL 99* 98* 100* 102  CO2 31 31 27  32  GLUCOSE 127* 94 96 79  BUN 49* 47* 40* 34*  CREATININE 2.12* 2.11* 1.90* 1.65*  CALCIUM 8.7* 8.6* 9.1 9.2  MG  --   --   --  2.2  AST  --   --  23  --   ALT  --   --  11*  --   ALKPHOS  --   --  116  --   BILITOT  --   --  0.7  --    ------------------------------------------------------------------------------------------------------------------ No results for input(s): CHOL, HDL, LDLCALC, TRIG, CHOLHDL, LDLDIRECT in the last 72 hours.  Lab Results  Component Value Date   HGBA1C 5.6 04/09/2016   ------------------------------------------------------------------------------------------------------------------ No results for input(s): TSH, T4TOTAL, T3FREE, THYROIDAB in the last 72 hours.  Invalid input(s): FREET3 ------------------------------------------------------------------------------------------------------------------ No results for input(s): VITAMINB12, FOLATE, FERRITIN, TIBC, IRON, RETICCTPCT in the last 72 hours.  Coagulation profile  Recent Labs Lab 05/04/16 0441 05/08/16 0535 05/09/16 0346  INR 1.30 2.81 3.22    No results for input(s): DDIMER in the last 72 hours.  Cardiac Enzymes No results for input(s): CKMB, TROPONINI, MYOGLOBIN in the last 168 hours.  Invalid input(s): CK ------------------------------------------------------------------------------------------------------------------    Component Value Date/Time   BNP 1,684.5 (H) 05/07/2016 2152    Micro Results No results found for this or any previous visit (from the past 240 hour(s)).  Radiology Reports Dg Chest 2 View  Result Date: 05/07/2016 CLINICAL DATA:  Initial evaluation for hip acute  shortness of breath. EXAM: CHEST  2 VIEW COMPARISON:  Prior radiograph from 07/30/2015. FINDINGS: Moderate cardiomegaly, stable. Prostatic bowel noted. Mediastinal silhouette within normal limits. Aortic atherosclerosis. Lungs normally inflated. Diffuse pulmonary vascular congestion with interstitial prominence, compatible with pulmonary interstitial edema. No focal infiltrates identified. No significant pleural effusion. No pneumothorax. No acute osseous abnormality. Remotely healed left-sided rib fractures noted. Diffuse osteopenia. IMPRESSION: 1. Cardiomegaly with mild to moderate diffuse pulmonary interstitial edema. 2. Aortic atherosclerosis. Electronically Signed   By: Jeannine Boga M.D.   On: 05/07/2016 23:10   Dg Chest Port 1 View  Result Date: 05/08/2016 CLINICAL DATA:  Shortness of breath. EXAM: PORTABLE CHEST 1 VIEW COMPARISON:  Radiograph of May 07, 2016. FINDINGS: Stable cardiomegaly. Status post cardiac valve repair. No pneumothorax or significant pleural effusion is noted. Mild central pulmonary vascular congestion is noted. No consolidative process  is noted. Bony thorax is unremarkable. IMPRESSION: Stable cardiomegaly and central pulmonary vascular congestion. Electronically Signed   By: Marijo Conception, M.D.   On: 05/08/2016 08:00   Dg Knee Complete 4 Views Left  Result Date: 05/07/2016 CLINICAL DATA:  Initial evaluation for chronic left knee pain. EXAM: LEFT KNEE - COMPLETE 4+ VIEW COMPARISON:  None. FINDINGS: Diffuse osteopenia, somewhat limiting evaluation for possible subtle acute nondisplaced fractures. No acute fracture or dislocation identified. Severe tricompartmental degenerative osteoarthrosis. No significant joint effusion. No soft tissue abnormality. IMPRESSION: 1. No acute osseous abnormality about the knee. 2. Severe tricompartmental degenerative osteoarthrosis. 3. Diffuse osteopenia. Electronically Signed   By: Jeannine Boga M.D.   On: 05/07/2016 23:12   Dg Hand  Complete Left  Result Date: 05/07/2016 CLINICAL DATA:  Initial evaluation for chronic left hand pain. EXAM: LEFT HAND - COMPLETE 3+ VIEW COMPARISON:  None available. FINDINGS: No acute fracture or dislocation. Extensive degenerative osteoarthritic changes present throughout the joints of the hand, most notable at the first [facility] and MCP joints. Diffuse osteopenia. No soft tissue abnormality. IMPRESSION: 1. No acute osseous abnormality about the left hand. 2. Extensive degenerative osteoarthritic changes throughout the joints of the hand, most notable at the first the [facility] and MCP joints. 3. Diffuse osteopenia. Electronically Signed   By: Jeannine Boga M.D.   On: 05/07/2016 23:15    Time Spent in minutes  30   Synethia Endicott K M.D on 05/09/2016 at 10:55 AM  Between 7am to 7pm - Pager - [phone]  After 7pm go to [email or web address] - password [facility]  [facility] -  Office  [phone]

## 2016-05-09 NOTE — Progress Notes (Addendum)
ANTICOAGULATION CONSULT NOTE - Follow Up Consult  Pharmacy Consult for Warfarin Indication: atrial fibrillation  Assessment: 28 yoF admitted 3/3 for dyspnea and weakness on warfarin prior to admission for Afib s/p MAZE and bioprosthetic MVR. INR goal 2-3 per outpatient anticoagulant visits. Home dose was 2.5 mg daily with an INR 2.81 (therapeutic) on admission. Today's INR is 3.22 (slightly supratherapeutic), hemoglobin and platelets are low but stable. No overt bleeding noted.  Goal of Therapy:  INR 2-3 Monitor platelets by anticoagulation protocol: Yes   Plan:  Warfarin 1 mg x 1 dose tonight Monitor daily INR and s/sx bleeding   Allergies  Allergen Reactions  . Indomethacin Other (See Comments)    dizziness  . Norvasc [Amlodipine Besylate] Cough    Patient Measurements: Height: 5' (152.4 cm) Weight: 175 lb 4.8 oz (79.5 kg) IBW/kg (Calculated) : 45.5  Vital Signs: Temp: 98.2 F (36.8 C) (03/04 0500) Temp Source: Oral (03/04 0500) BP: 144/50 (03/04 0500) Pulse Rate: 64 (03/04 0500)  Labs:  Recent Labs  05/07/16 1815 05/08/16 0535 05/09/16 0346  HGB 8.9* 8.7*  --   HCT 29.4* 28.2*  --   PLT 165 138*  --   LABPROT  --  30.2* 33.6*  INR  --  2.81 3.22  CREATININE 1.90*  --  1.65*    Estimated Creatinine Clearance: 27.5 mL/min (by C-G formula based on SCr of 1.65 mg/dL (H)).  Medications:  Scheduled:  . amiodarone  100 mg Oral Daily  . colchicine  0.3 mg Oral Daily  . ferrous sulfate  325 mg Oral BID WC  . fesoterodine  4 mg Oral Daily  . furosemide  40 mg Intravenous BID  . levothyroxine  50 mcg Oral QAC breakfast  . pantoprazole  40 mg Oral Daily  . potassium chloride  40 mEq Oral Daily  . simvastatin  10 mg Oral QPM  . vitamin B-12  1,000 mcg Oral Daily  . warfarin  1 mg Oral ONCE-1800  . Warfarin - Pharmacist Dosing Inpatient   Does not apply [id number]    Belia Heman, PharmD PGY1 Pharmacy Resident [phone] (Pager) 05/09/2016 10:20 AM

## 2016-05-10 LAB — GLUCOSE, CAPILLARY: GLUCOSE-CAPILLARY: 173 mg/dL — AB (ref 65–99)

## 2016-05-10 LAB — BASIC METABOLIC PANEL
ANION GAP: 6 (ref 5–15)
BUN: 31 mg/dL — AB (ref 6–20)
CHLORIDE: 104 mmol/L (ref 101–111)
CO2: 30 mmol/L (ref 22–32)
Calcium: 8.9 mg/dL (ref 8.9–10.3)
Creatinine, Ser: 1.55 mg/dL — ABNORMAL HIGH (ref 0.44–1.00)
GFR calc Af Amer: 37 mL/min — ABNORMAL LOW (ref >60)
GFR calc non Af Amer: 32 mL/min — ABNORMAL LOW (ref >60)
Glucose, Bld: 66 mg/dL (ref 65–99)
POTASSIUM: 4.4 mmol/L (ref 3.5–5.1)
SODIUM: 140 mmol/L (ref 135–145)

## 2016-05-10 LAB — PROTIME-INR
INR: 3.59
Prothrombin Time: 36.7 seconds — ABNORMAL HIGH (ref 11.4–15.2)

## 2016-05-10 MED ORDER — CARVEDILOL 3.125 MG PO TABS
3.1250 mg | ORAL_TABLET | Freq: Two times a day (BID) | ORAL | Status: DC
  Administered 2016-05-10 – 2016-05-11 (×3): 3.125 mg via ORAL
  Filled 2016-05-10 (×3): qty 1

## 2016-05-10 NOTE — Progress Notes (Signed)
CM talked to patient about DCP; patient is agreeable to go to SNF just not the [facility]; Pt gave CM permission to call her daughter [name] [phone]);   TCT [name], she is in agreement for patient to return to SNF and would like the SW to call her about another SNF and the copays; SW to follow up; Aneta Mins [phone]

## 2016-05-10 NOTE — Clinical Social Work Note (Signed)
CSW consulted for SNF placement. CSW met with pt. Pt in agreement with SNF placement at [facility] in [place] only. CSW assessment to follow.   [address], [place], [place]

## 2016-05-10 NOTE — Progress Notes (Signed)
ANTICOAGULATION CONSULT NOTE - Follow Up Consult  Pharmacy Consult for Warfarin Indication: atrial fibrillation  Assessment: [name] [name] admitted 3/3 for dyspnea and weakness on warfarin prior to admission for Afib s/p MAZE (2015) and bioprosthetic MVR (2015). INR goal 2-3 per outpatient anticoagulant visits. Home dose was 2.5 mg daily with an INR 2.81 (therapeutic) on admission. [place] vasc 2 score of 5. On amiodarone and coumadin pta.  Today's INR has increased to 3.59, supratherapeutic.  Last CBC done on 3/3, Hgb and platelets were low but stable. No overt bleeding noted. INR trending upward above goal. Lower 1mg  dose given yesterday. Patient takes amiodarone PTA and continues inpatient thus DDI is not new. Possibly INR elevated due to  lower po intake-only ate 50% breakfast this AM, only a few bites of lunch yesterday, no record of evening meal.    Goal of Therapy:  INR 2-3 Monitor platelets by anticoagulation protocol: Yes   Plan:  No warfarin tonight Monitor daily INR and s/sx bleeding MD notes today plan for SNF in am    Allergies  Allergen Reactions  . Indomethacin Other (See Comments)    dizziness  . Norvasc [Amlodipine Besylate] Cough    Patient Measurements: Height: 5' (152.4 cm) Weight: 168 lb 1.6 oz (76.2 kg) IBW/kg (Calculated) : 45.5  Vital Signs: Temp: 97.6 F (36.4 C) (03/05 0430) Temp Source: Oral (03/05 0430) BP: 134/51 (03/05 0430) Pulse Rate: [name] (03/05 0430)  Labs:  Recent Labs  05/07/16 1815 05/08/16 0535 05/09/16 0346 05/10/16 0432  HGB 8.9* 8.7*  --   --   HCT 29.4* 28.2*  --   --   PLT 165 138*  --   --   LABPROT  --  30.2* 33.6* 36.7*  INR  --  2.81 3.22 3.59  CREATININE 1.90*  --  1.65* 1.55*    Estimated Creatinine Clearance: 28.6 mL/min (by C-G formula based on SCr of 1.55 mg/dL (H)).  Medications:  Scheduled:  . amiodarone  100 mg Oral Daily  . carvedilol  3.125 mg Oral BID WC  . colchicine  0.3 mg Oral Daily  . ferrous sulfate   325 mg Oral BID WC  . fesoterodine  4 mg Oral Daily  . furosemide  40 mg Intravenous BID  . levothyroxine  50 mcg Oral QAC breakfast  . pantoprazole  40 mg Oral Daily  . potassium chloride  40 mEq Oral Daily  . simvastatin  10 mg Oral QPM  . vitamin B-12  1,000 mcg Oral Daily  . Warfarin - Pharmacist Dosing Inpatient   Does not apply [place], RPh Clinical Pharmacist Pager: [phone] 8A-4P [phone] 4P-10P [phone] [place] [phone] 05/10/2016 11:46 AM

## 2016-05-10 NOTE — Progress Notes (Addendum)
PROGRESS NOTE                                                                                                                                                                                                             Patient Demographics:    [name] [name], is a 76 y.o. female, DOB - 1940/10/11, [id number]  Admit date - 05/07/2016   Admitting Physician Edwin Dada, MD  Outpatient Primary MD for the patient is [name], MD  LOS - 3  Chief Complaint  Patient presents with  . multiple complaints       Brief Narrative  [name] [name] is a 76 y.o. female with a past medical history significant for CHF EF 35% on home O2, Afib s/p MAZE on warfarin, bioprosthetic MVR, and HTN who presents with weakness. In the ER found to be in acute on chronic systolic CHF.   Subjective:    [name] [name] today has, No headache, No chest pain, No abdominal pain - No Nausea, No new weakness tingling or numbness, No Cough - Improved SOB.    Assessment  & Plan :     1.Acute on chronic combined diastolic and systolic CHF EF [id number] on recent echo. Placed on IV Lasix, heart rate Better so will try adding Coreg and monitor, renal function does not allow ACE/ARB, placed on salt and fluid restriction, continue supportive care with oxygen and nebulizer treatment as needed. She is clinically already feeling better will continue the same. Daily weights (wrong), monitor intake output.  Filed Weights   05/08/16 0716 05/09/16 0500 05/10/16 0430  Weight: 77.2 kg (170 lb 3.2 oz) 79.5 kg (175 lb 4.8 oz) 76.2 kg (168 lb 1.6 oz)    2. Chronic A. fib Mali vasc 2 score of 5. On amiodarone Coumadin continue pharmacy monitoring INR.  3. History of chronic arthritis, recent left wrist flare. On colchicine continue continue left wrist splint.  4. Hypothyroidism. Continue Synthroid. PCP to monitor thyroid function in the light of  amiodarone.  5. Macrocytic anemia. B-12 borderline low, agree with oral supplementation. Outpatient follow with PCP. Note patient's B-12 was over 2000 about 2 years ago.  6. GERD. On PPI  7. Dyslipidemia. On statin continue.  8. 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    Diet : Diet Heart Room service appropriate? Yes; Fluid consistency: Thin;  Fluid restriction: 1500 mL Fluid    Family Communication  :  Daughter over the phone on 05/10/2016  Code Status :  Full  Disposition Plan  :  SNF in am   Consults  :  Cards  Procedures  :  None  TTE feb 2018   Left ventricle: Poor acoustic windows limit study Endocardium is diffficult to see. Hypokinesis appears to be worse in the  inferior/inferoseptal walls. The cavity size was normal. Wall thickness was increased in a pattern of mild LVH. Systolic function was moderately reduced. The estimated ejection fraction was in the range of 35% to 40%. - Aortic valve: AV is functionally bicuspid It is thickened, calcified Peak and mean gradients through the valve are 18 and 11   mm Hg respectively. There was moderate regurgitation. - Mitral valve: MV prosthesis is well seated. Difficult to see leaflets well. Peak and mean gradients through the valve are 12 and 3 mm Hg respectively MVA by P T1/2 is 2 cm2. Valve area by continuity equation (using LVOT flow): 2.19 cm^2. - Left atrium: The atrium was mildly dilated. - Right ventricle: Systolic function was mildly to moderately  reduced. - Right atrium: The atrium was mildly dilated. - Pulmonary arteries: PA peak pressure: 66 mm Hg (S).  Impressions:  Compared to report from 2016, LVEF is now depressed.  DVT Prophylaxis  :  Coumadin  Lab Results  Component Value Date   INR 3.59 05/10/2016   INR 3.22 05/09/2016   INR 2.81 05/08/2016     Lab Results  Component Value Date   PLT 138 (L) 05/08/2016    Inpatient Medications  Scheduled Meds: . amiodarone  100 mg Oral Daily  . colchicine  0.3 mg Oral Daily    . ferrous sulfate  325 mg Oral BID WC  . fesoterodine  4 mg Oral Daily  . furosemide  40 mg Intravenous BID  . levothyroxine  50 mcg Oral QAC breakfast  . pantoprazole  40 mg Oral Daily  . potassium chloride  40 mEq Oral Daily  . simvastatin  10 mg Oral QPM  . vitamin B-12  1,000 mcg Oral Daily  . Warfarin - Pharmacist Dosing Inpatient   Does not apply q1800   Continuous Infusions: PRN Meds:.acetaminophen, traMADol  Antibiotics  :    Anti-infectives    None         Objective:   Vitals:   05/09/16 0500 05/09/16 1345 05/09/16 2200 05/10/16 0430  BP: (!) 144/50 (!) 132/52 130/62 (!) 134/51  Pulse: 64 65 62 62  Resp: 18 18 18 17   Temp: 98.2 F (36.8 C) 98 F (36.7 C) 98.1 F (36.7 C) 97.6 F (36.4 C)  TempSrc: Oral Oral Oral Oral  SpO2: 90% 96% 97% 98%  Weight: 79.5 kg (175 lb 4.8 oz)   76.2 kg (168 lb 1.6 oz)  Height:        Wt Readings from Last 3 Encounters:  05/10/16 76.2 kg (168 lb 1.6 oz)  04/29/16 76.2 kg (168 lb)  04/20/16 76.2 kg (168 lb)     Intake/Output Summary (Last 24 hours) at 05/10/16 1053 Last data filed at 05/10/16 0900  Gross per 24 hour  Intake              600 ml  Output                0 ml  Net  600 ml     Physical Exam  Awake Alert, Oriented X 3, No new F.N deficits, Normal affect [place].AT,PERRAL Supple Neck,No JVD, No cervical lymphadenopathy appriciated.  Symmetrical Chest wall movement, Good air movement bilaterally, few rales RRR,No Gallops,Rubs or new Murmurs, No Parasternal Heave +ve B.Sounds, Abd Soft, No tenderness, No organomegaly appriciated, No rebound - guarding or rigidity. No Cyanosis, Clubbing, 1+ edema, No new Rash or bruise , chronic lower extrimity lichenification     Data Review:    CBC  Recent Labs Lab 05/04/16 0441 05/07/16 1815 05/08/16 0535  WBC 3.0* 3.6* 3.6*  HGB 7.9* 8.9* 8.7*  HCT 25.0* 29.4* 28.2*  PLT 127* 165 138*  MCV 103.3* 102.8* 102.2*  MCH 32.6 31.1 31.5  MCHC 31.6 30.3  30.9  RDW 17.1* 17.3* 17.4*    Chemistries   Recent Labs Lab 05/07/16 1815 05/09/16 0346 05/10/16 0432  NA 136 140 140  K 4.3 3.7 4.4  CL 100* 102 104  CO2 27 32 30  GLUCOSE 96 79 66  BUN 40* 34* 31*  CREATININE 1.90* 1.65* 1.55*  CALCIUM 9.1 9.2 8.9  MG  --  2.2  --   AST 23  --   --   ALT 11*  --   --   ALKPHOS 116  --   --   BILITOT 0.7  --   --    ------------------------------------------------------------------------------------------------------------------ No results for input(s): CHOL, HDL, LDLCALC, TRIG, CHOLHDL, LDLDIRECT in the last 72 hours.  Lab Results  Component Value Date   HGBA1C 5.6 04/09/2016   ------------------------------------------------------------------------------------------------------------------ No results for input(s): TSH, T4TOTAL, T3FREE, THYROIDAB in the last 72 hours.  Invalid input(s): FREET3 ------------------------------------------------------------------------------------------------------------------ No results for input(s): VITAMINB12, FOLATE, FERRITIN, TIBC, IRON, RETICCTPCT in the last 72 hours.  Coagulation profile  Recent Labs Lab 05/04/16 0441 05/08/16 0535 05/09/16 0346 05/10/16 0432  INR 1.30 2.81 3.22 3.59    No results for input(s): DDIMER in the last 72 hours.  Cardiac Enzymes No results for input(s): CKMB, TROPONINI, MYOGLOBIN in the last 168 hours.  Invalid input(s): CK ------------------------------------------------------------------------------------------------------------------    Component Value Date/Time   BNP 1,684.5 (H) 05/07/2016 2152    Micro Results No results found for this or any previous visit (from the past 240 hour(s)).  Radiology Reports Dg Chest 2 View  Result Date: 05/07/2016 CLINICAL DATA:  Initial evaluation for hip acute shortness of breath. EXAM: CHEST  2 VIEW COMPARISON:  Prior radiograph from 07/30/2015. FINDINGS: Moderate cardiomegaly, stable. Prostatic bowel noted.  Mediastinal silhouette within normal limits. Aortic atherosclerosis. Lungs normally inflated. Diffuse pulmonary vascular congestion with interstitial prominence, compatible with pulmonary interstitial edema. No focal infiltrates identified. No significant pleural effusion. No pneumothorax. No acute osseous abnormality. Remotely healed left-sided rib fractures noted. Diffuse osteopenia. IMPRESSION: 1. Cardiomegaly with mild to moderate diffuse pulmonary interstitial edema. 2. Aortic atherosclerosis. Electronically Signed   By: Jeannine Boga M.D.   On: 05/07/2016 23:10   Dg Chest Port 1 View  Result Date: 05/08/2016 CLINICAL DATA:  Shortness of breath. EXAM: PORTABLE CHEST 1 VIEW COMPARISON:  Radiograph of May 07, 2016. FINDINGS: Stable cardiomegaly. Status post cardiac valve repair. No pneumothorax or significant pleural effusion is noted. Mild central pulmonary vascular congestion is noted. No consolidative process is noted. Bony thorax is unremarkable. IMPRESSION: Stable cardiomegaly and central pulmonary vascular congestion. Electronically Signed   By: Marijo Conception, M.D.   On: 05/08/2016 08:00   Dg Knee Complete 4 Views Left  Result Date: 05/07/2016 CLINICAL  DATA:  Initial evaluation for chronic left knee pain. EXAM: LEFT KNEE - COMPLETE 4+ VIEW COMPARISON:  None. FINDINGS: Diffuse osteopenia, somewhat limiting evaluation for possible subtle acute nondisplaced fractures. No acute fracture or dislocation identified. Severe tricompartmental degenerative osteoarthrosis. No significant joint effusion. No soft tissue abnormality. IMPRESSION: 1. No acute osseous abnormality about the knee. 2. Severe tricompartmental degenerative osteoarthrosis. 3. Diffuse osteopenia. Electronically Signed   By: Jeannine Boga M.D.   On: 05/07/2016 23:12   Dg Hand Complete Left  Result Date: 05/07/2016 CLINICAL DATA:  Initial evaluation for chronic left hand pain. EXAM: LEFT HAND - COMPLETE 3+ VIEW COMPARISON:   None available. FINDINGS: No acute fracture or dislocation. Extensive degenerative osteoarthritic changes present throughout the joints of the hand, most notable at the first [facility] and MCP joints. Diffuse osteopenia. No soft tissue abnormality. IMPRESSION: 1. No acute osseous abnormality about the left hand. 2. Extensive degenerative osteoarthritic changes throughout the joints of the hand, most notable at the first the [facility] and MCP joints. 3. Diffuse osteopenia. Electronically Signed   By: Jeannine Boga M.D.   On: 05/07/2016 23:15    Time Spent in minutes  30   SINGH,PRASHANT K M.D on 05/10/2016 at 10:53 AM  Between 7am to 7pm - Pager - [phone]  After 7pm go to [email or web address] - password [facility]  [facility] -  Office  [phone]

## 2016-05-10 NOTE — Progress Notes (Signed)
Physical Therapy Treatment Patient Details Name: [name] [name] MRN: [id number] DOB: [birth year] Today's Date: 05/10/2016    History of Present Illness PT is a 76 y/o female admitted secondary to dyspnea and weakness. PMH including but not limited to CHF, COPD (on 2L of supplemental O2 at all times), CKD, HTN, hx of CVA in 2000, a-fib s/p MAZE procedure in 2015, s/p cardioversion in 2015 and s/p MVR in 2015.    PT Comments    Pt with no L knee pain today and was able to do SPT with shuffeled gait pattern.  Encouraged ambulation, but pt declined stating she couldn't, but said it wasn't due to pain or fatigue.  She was unable to clarify why she didn't feel she could ambulate any farther.  Con't to recommend SNF.   Follow Up Recommendations  SNF;Supervision/Assistance - 24 hour     Equipment Recommendations  None recommended by PT    Recommendations for Other Services       Precautions / Restrictions Precautions Precautions: Fall Restrictions Weight Bearing Restrictions: No    Mobility  Bed Mobility Overal bed mobility: Needs Assistance Bed Mobility: Supine to Sit     Supine to sit: Min assist;HOB elevated     General bed mobility comments: HOB slightly elevated and use of rails.  Bed pad to assist to get hips turned.  Transfers Overall transfer level: Needs assistance Equipment used: Rolling walker (2 wheeled) Transfers: Sit to/from [facility] Sit to Stand: Mod assist;+2 safety/equipment Stand pivot transfers: Mod assist;+2 safety/equipment       General transfer comment: Cues for hand placement.  Slow shuffeled pattern with SPT with RW with 2nd person present, but not needed for physical A.  Pt states she didn't have any pain and was not too fatigued, but did not feel she could ambulate.  She was unable to clarify why she didn't feel she could ambulate farther  Ambulation/Gait                 Stairs            Wheelchair  Mobility    Modified Rankin (Stroke Patients Only)       Balance                                    Cognition Arousal/Alertness: Awake/alert Behavior During Therapy: WFL for tasks assessed/performed Overall Cognitive Status: No family/caregiver present to determine baseline cognitive functioning Area of Impairment: Memory     Memory: Decreased short-term memory              Exercises      General Comments General comments (skin integrity, edema, etc.): LE darkened skin      Pertinent Vitals/Pain Pain Assessment: Faces Faces Pain Scale: Hurts a little bit Pain Location: L wrist Pain Descriptors / Indicators: Sore Pain Intervention(s): Limited activity within patient's tolerance;Monitored during session;Repositioned    Home Living                      Prior Function            PT Goals (current goals can now be found in the care plan section) Acute Rehab PT Goals PT Goal Formulation: With patient Time For Goal Achievement: 05/22/16 Potential to Achieve Goals: Good Progress towards PT goals: Progressing toward goals    Frequency    Min 3X/week  PT Plan Current plan remains appropriate    Co-evaluation             End of Session Equipment Utilized During Treatment: Gait belt;Oxygen Activity Tolerance: Patient limited by fatigue Patient left: in chair;with call bell/phone within reach;with chair alarm set Nurse Communication: Mobility status PT Visit Diagnosis: Muscle weakness (generalized) (M62.81);Difficulty in walking, not elsewhere classified (R26.2)     Time: [id number] PT Time Calculation (min) (ACUTE ONLY): 19 min  Charges:  $Therapeutic Activity: 8-22 mins                    G Codes:       Michaell Grider LUBECK 05/10/2016, 9:55 AM

## 2016-05-11 DIAGNOSIS — N184 Chronic kidney disease, stage 4 (severe): Secondary | ICD-10-CM | POA: Diagnosis not present

## 2016-05-11 DIAGNOSIS — I10 Essential (primary) hypertension: Secondary | ICD-10-CM | POA: Diagnosis not present

## 2016-05-11 DIAGNOSIS — Z953 Presence of xenogenic heart valve: Secondary | ICD-10-CM | POA: Diagnosis not present

## 2016-05-11 DIAGNOSIS — J189 Pneumonia, unspecified organism: Secondary | ICD-10-CM | POA: Diagnosis not present

## 2016-05-11 DIAGNOSIS — I519 Heart disease, unspecified: Secondary | ICD-10-CM | POA: Diagnosis not present

## 2016-05-11 DIAGNOSIS — J9611 Chronic respiratory failure with hypoxia: Secondary | ICD-10-CM | POA: Diagnosis not present

## 2016-05-11 DIAGNOSIS — M6281 Muscle weakness (generalized): Secondary | ICD-10-CM | POA: Diagnosis not present

## 2016-05-11 DIAGNOSIS — I5022 Chronic systolic (congestive) heart failure: Secondary | ICD-10-CM | POA: Diagnosis not present

## 2016-05-11 DIAGNOSIS — I5031 Acute diastolic (congestive) heart failure: Secondary | ICD-10-CM | POA: Diagnosis not present

## 2016-05-11 DIAGNOSIS — R531 Weakness: Secondary | ICD-10-CM | POA: Diagnosis not present

## 2016-05-11 DIAGNOSIS — E876 Hypokalemia: Secondary | ICD-10-CM | POA: Diagnosis not present

## 2016-05-11 DIAGNOSIS — R404 Transient alteration of awareness: Secondary | ICD-10-CM | POA: Diagnosis not present

## 2016-05-11 DIAGNOSIS — E039 Hypothyroidism, unspecified: Secondary | ICD-10-CM | POA: Diagnosis not present

## 2016-05-11 DIAGNOSIS — D539 Nutritional anemia, unspecified: Secondary | ICD-10-CM | POA: Diagnosis not present

## 2016-05-11 DIAGNOSIS — R6889 Other general symptoms and signs: Secondary | ICD-10-CM | POA: Diagnosis not present

## 2016-05-11 DIAGNOSIS — J181 Lobar pneumonia, unspecified organism: Secondary | ICD-10-CM | POA: Diagnosis not present

## 2016-05-11 DIAGNOSIS — I509 Heart failure, unspecified: Secondary | ICD-10-CM | POA: Diagnosis not present

## 2016-05-11 DIAGNOSIS — I130 Hypertensive heart and chronic kidney disease with heart failure and stage 1 through stage 4 chronic kidney disease, or unspecified chronic kidney disease: Secondary | ICD-10-CM | POA: Diagnosis not present

## 2016-05-11 DIAGNOSIS — J9601 Acute respiratory failure with hypoxia: Secondary | ICD-10-CM | POA: Diagnosis not present

## 2016-05-11 DIAGNOSIS — J9602 Acute respiratory failure with hypercapnia: Secondary | ICD-10-CM | POA: Diagnosis not present

## 2016-05-11 DIAGNOSIS — I482 Chronic atrial fibrillation: Secondary | ICD-10-CM | POA: Diagnosis not present

## 2016-05-11 DIAGNOSIS — I5043 Acute on chronic combined systolic (congestive) and diastolic (congestive) heart failure: Secondary | ICD-10-CM | POA: Diagnosis not present

## 2016-05-11 DIAGNOSIS — I4891 Unspecified atrial fibrillation: Secondary | ICD-10-CM | POA: Diagnosis not present

## 2016-05-11 DIAGNOSIS — R2689 Other abnormalities of gait and mobility: Secondary | ICD-10-CM | POA: Diagnosis not present

## 2016-05-11 DIAGNOSIS — N183 Chronic kidney disease, stage 3 (moderate): Secondary | ICD-10-CM | POA: Diagnosis not present

## 2016-05-11 DIAGNOSIS — Z8249 Family history of ischemic heart disease and other diseases of the circulatory system: Secondary | ICD-10-CM | POA: Diagnosis not present

## 2016-05-11 DIAGNOSIS — R05 Cough: Secondary | ICD-10-CM | POA: Diagnosis not present

## 2016-05-11 DIAGNOSIS — R0902 Hypoxemia: Secondary | ICD-10-CM | POA: Diagnosis not present

## 2016-05-11 LAB — PROTIME-INR
INR: 3.53
Prothrombin Time: 36.2 seconds — ABNORMAL HIGH (ref 11.4–15.2)

## 2016-05-11 LAB — BASIC METABOLIC PANEL
Anion gap: 6 (ref 5–15)
BUN: 27 mg/dL — AB (ref 6–20)
CALCIUM: 9 mg/dL (ref 8.9–10.3)
CO2: 33 mmol/L — ABNORMAL HIGH (ref 22–32)
Chloride: 102 mmol/L (ref 101–111)
Creatinine, Ser: 1.49 mg/dL — ABNORMAL HIGH (ref 0.44–1.00)
GFR calc Af Amer: 38 mL/min — ABNORMAL LOW (ref >60)
GFR, EST NON AFRICAN AMERICAN: 33 mL/min — AB (ref >60)
Glucose, Bld: 82 mg/dL (ref 65–99)
POTASSIUM: 4.3 mmol/L (ref 3.5–5.1)
SODIUM: 141 mmol/L (ref 135–145)

## 2016-05-11 MED ORDER — WARFARIN SODIUM 5 MG PO TABS: 2.5000 mg | ORAL_TABLET | Freq: Every day | ORAL | 0 refills | Status: AC

## 2016-05-11 MED ORDER — CYANOCOBALAMIN 1000 MCG PO TABS: 1000.0000 ug | ORAL_TABLET | Freq: Every day | ORAL | Status: AC

## 2016-05-11 MED ORDER — CARVEDILOL 3.125 MG PO TABS: 3.1250 mg | ORAL_TABLET | Freq: Two times a day (BID) | ORAL | Status: AC

## 2016-05-11 MED ORDER — FUROSEMIDE 40 MG PO TABS: 40.0000 mg | ORAL_TABLET | Freq: Two times a day (BID) | ORAL | Status: DC

## 2016-05-11 MED ORDER — POTASSIUM CHLORIDE CRYS ER 20 MEQ PO TBCR: 40.0000 meq | EXTENDED_RELEASE_TABLET | Freq: Every day | ORAL | Status: DC

## 2016-05-11 NOTE — Progress Notes (Signed)
ANTICOAGULATION CONSULT NOTE - Follow Up Consult  Pharmacy Consult for Warfarin Indication: atrial fibrillation  Assessment: 29 yoF admitted 3/3 for dyspnea and weakness on warfarin prior to admission for Afib s/p MAZE (2015) and bioprosthetic MVR (2015). INR goal 2-3 per outpatient anticoagulant visits. Home dose was 2.5 mg daily with an INR 2.81 (therapeutic) on admission. Mali vasc 2 score of 5. On amiodarone and coumadin pta.  Today's INR  3.53,  supratherapeutic, slightly down from 3.59 yesterday.  Warfarin dose held last night due to INR 3.59 and lower 1mg  dose given on 3/4th. Last CBC done on 3/3, Hgb and platelets were low but stable. No overt bleeding noted. Patient takes amiodarone PTA and continues inpatient,  thus DDI is not new. Possibly INR elevated due to  lower po intake on 3/4-3/5, but meal intake has increased to 100% x2 since last night.    Goal of Therapy:  INR 2-3 Monitor platelets by anticoagulation protocol: Yes   Plan:  Going back to SNF today on coumadin 2.5mg  daily to start tomorrow 3/7 per MD's discharge orders.  No coumadin tonight 3/6.  INR to be monitored at SNF. No orders for coumadin tonight (hold dose x1) INR in AM if pt remains here overnight.   Allergies  Allergen Reactions  . Indomethacin Other (See Comments)    dizziness  . Norvasc [Amlodipine Besylate] Cough    Patient Measurements: Height: 5' (152.4 cm) Weight: 168 lb 8 oz (76.4 kg) IBW/kg (Calculated) : 45.5  Vital Signs: Temp: 97.3 F (36.3 C) (03/06 0935) Temp Source: Oral (03/06 0935) BP: 102/71 (03/06 0935) Pulse Rate: 50 (03/06 0935)  Labs:  Recent Labs  05/09/16 0346 05/10/16 0432 05/11/16 0421  LABPROT 33.6* 36.7* 36.2*  INR 3.22 3.59 3.53  CREATININE 1.65* 1.55* 1.49*    Estimated Creatinine Clearance: 29.8 mL/min (by C-G formula based on SCr of 1.49 mg/dL (H)).  Medications:  Scheduled:  . amiodarone  100 mg Oral Daily  . carvedilol  3.125 mg Oral BID WC  .  colchicine  0.3 mg Oral Daily  . ferrous sulfate  325 mg Oral BID WC  . fesoterodine  4 mg Oral Daily  . furosemide  40 mg Intravenous BID  . levothyroxine  50 mcg Oral QAC breakfast  . pantoprazole  40 mg Oral Daily  . potassium chloride  40 mEq Oral Daily  . simvastatin  10 mg Oral QPM  . vitamin B-12  1,000 mcg Oral Daily  . Warfarin - Pharmacist Dosing Inpatient   Does not apply q1800    Nicole Cella, RPh Clinical Pharmacist Pager: [phone] 8A-4P [phone] 4P-10P [phone] [place] [phone] 05/11/2016 11:42 AM

## 2016-05-11 NOTE — NC FL2 (Signed)
[place] LEVEL OF CARE SCREENING TOOL     IDENTIFICATION  Patient Name: [name] [name] Birthdate: 1940-10-10 Sex: female Admission Date (Current Location): 05/07/2016  [facility] and [place] Number:  Herbalist and Address:  [facility] [place]. [facility], [place] [address] [place], [place] [address]      Provider Number: [id number]  Attending Physician Name and Address:  Thurnell Lose, MD  Relative Name and Phone Number:       Current Level of Care: Hospital Recommended Level of Care: [place] Prior Approval Number:    Date Approved/Denied:   PASRR Number: [id number]  Discharge Plan: SNF    Current Diagnoses: Patient Active Problem List   Diagnosis Date Noted  . Acute on chronic combined systolic and diastolic CHF (congestive heart failure) ([place]) 05/07/2016  . Gastrointestinal hemorrhage 04/29/2016  . Macrocytic anemia   . History of CVA in adulthood   . Pulmonary hypertension 04/10/2016  . Chronic respiratory failure with hypoxia ([place]) 04/10/2016  . TIA (transient ischemic attack) 04/09/2016  . Thrombocytopenia ([place]) 04/09/2016  . Physical deconditioning 04/09/2016  . Transient weakness of left lower extremity   . CKD (chronic kidney disease) stage 3, GFR 30-59 ml/min 06/25/2015  . CAP (community acquired pneumonia) 11/02/2014  . Anemia, iron deficiency 05/09/2014  . UTI (urinary tract infection) 05/09/2014  . Obesity 05/08/2014  . Hypothyroidism, acquired 05/08/2014  . Fx lumbar vertebra-closed ([place]) 11/12/2013  . Lumbar vertebral fracture ([place]) 11/11/2013  . DNR (do not resuscitate) 10/23/2013  . Anemia in chronic kidney disease 10/20/2013  . Acute on chronic diastolic heart failure ([place]) 10/14/2013  . Long term current use of anticoagulant therapy 06/22/2013  . Chronic combined systolic and diastolic CHF (congestive heart failure) ([place]) 06/18/2013  . CVA (cerebral infarction) 05/21/2013  . Acute pulmonary  edema ([place]) 05/18/2013  . 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 S/P Maze operation for atrial fibrillation 05/16/2013  . Aortic insufficiency due to bicuspid aortic valve   . Permanent atrial fibrillation ([place])   . Arthritis   . Acute respiratory failure ([place]) 05/02/2013  . Juvenile rheumatic fever   . Mitral regurgitation 02/16/2013  . Essential hypertension 02/15/2011    Orientation RESPIRATION BLADDER Height & Weight     Self, Time, Situation, Place  O2 (2L) Incontinent Weight: 168 lb 8 oz (76.4 kg) Height:  5' (152.4 cm)  BEHAVIORAL SYMPTOMS/MOOD NEUROLOGICAL BOWEL NUTRITION STATUS      Continent Diet (Heart Healthy; thin liquids (1500 ml restriction))  AMBULATORY STATUS COMMUNICATION OF NEEDS Skin   Limited Assist Verbally Normal                       Personal Care Assistance Level of Assistance  Bathing, Feeding, Dressing Bathing Assistance: Maximum assistance Feeding assistance: Limited assistance Dressing Assistance: Maximum assistance     Functional Limitations Info  Sight, Hearing, Speech Sight Info: Adequate Hearing Info: Adequate Speech Info: Adequate    SPECIAL CARE FACTORS FREQUENCY  PT (By licensed PT)                    Contractures Contractures Info: Not present    Additional Factors Info  Code Status, Allergies Code Status Info: DNR Allergies Info:  Indomethacin, Norvasc Amlodipine Besylate           Current Medications (05/11/2016):  This is the current hospital active medication list Current Facility-Administered Medications  Medication Dose Route Frequency Provider Last Rate Last Dose  . acetaminophen (TYLENOL) tablet 650 mg  650 mg Oral Q4H PRN Edwin Dada, MD   650 mg at 05/10/16 1729  . amiodarone (PACERONE) tablet 100 mg  100 mg Oral Daily Edwin Dada, MD   100 mg at 05/10/16 0951  . carvedilol (COREG) tablet 3.125 mg  3.125 mg Oral BID WC  Thurnell Lose, MD   3.125 mg at 05/11/16 0547  . colchicine tablet 0.3 mg  0.3 mg Oral Daily Edwin Dada, MD   0.3 mg at 05/10/16 0951  . ferrous sulfate tablet 325 mg  325 mg Oral BID WC Edwin Dada, MD   325 mg at 05/11/16 0547  . fesoterodine (TOVIAZ) tablet 4 mg  4 mg Oral Daily Edwin Dada, MD   4 mg at 05/10/16 0951  . furosemide (LASIX) injection 40 mg  40 mg Intravenous BID Edwin Dada, MD   40 mg at 05/10/16 1634  . levothyroxine (SYNTHROID, LEVOTHROID) tablet 50 mcg  50 mcg Oral QAC breakfast Edwin Dada, MD   50 mcg at 05/11/16 0540  . pantoprazole (PROTONIX) EC tablet 40 mg  40 mg Oral Daily Edwin Dada, MD   40 mg at 05/10/16 0951  . potassium chloride SA (K-DUR,KLOR-CON) CR tablet 40 mEq  40 mEq Oral Daily Edwin Dada, MD   40 mEq at 05/10/16 0951  . simvastatin (ZOCOR) tablet 10 mg  10 mg Oral QPM Edwin Dada, MD   10 mg at 05/10/16 1725  . traMADol (ULTRAM) tablet 25-50 mg  25-50 mg Oral Q4H PRN Edwin Dada, MD   50 mg at 05/09/16 2251  . vitamin B-12 (CYANOCOBALAMIN) tablet 1,000 mcg  1,000 mcg Oral Daily Edwin Dada, MD   1,000 mcg at 05/10/16 0951  . Warfarin - Pharmacist Dosing Inpatient   Does not apply [id number] Thurnell Lose, MD   Stopped at 05/10/16 1800     Discharge Medications: Please see discharge summary for a list of discharge medications.  Relevant Imaging Results:  Relevant Lab Results:   Additional Information SSN: [id number]  Truitt Merle, LCSW

## 2016-05-11 NOTE — Clinical Social Work Note (Addendum)
Clinicals faxed to insurance company for review.  Dayton Scrape, [place]  11:56 am CSW notified patient of plan for discharge today and that we are awaiting insurance authorization.  Dayton Scrape, [place] [phone]  12:16 pm Fax did not go through. CSW faxed again.  Dayton Scrape, [place] [phone]  12:49 pm CSW confirmed discharge plan with admissions coordinator at SNF.  Dayton Scrape, CSW [phone]  1:44 pm CSW received confirmation from [facility] that they had received the patient's clinicals and they had been forwarded to a nurse for review.  Dayton Scrape, [place] [phone]  4:04 pm Representative at [facility] said we should have insurance authorization in about 20 minutes.  Dayton Scrape, CSW [phone]  5:15 pm CSW had to call automated phone line for [facility] to get authorization number: [id number]. CSW provided to RN and she will email to admissions coordinator who has left for the day.  Dayton Scrape, [place]

## 2016-05-11 NOTE — Clinical Social Work Placement (Signed)
   CLINICAL SOCIAL WORK PLACEMENT  NOTE  Date:  05/11/2016  Patient Details  Name: [name] [name] MRN: [id number] Date of Birth: 1940/05/12  Clinical Social Work is seeking post-discharge placement for this patient at the [place] level of care (*CSW will initial, date and re-position this form in  chart as items are completed):  Yes   Patient/family provided with [place] Work Department's list of facilities offering this level of care within the geographic area requested by the patient (or if unable, by the patient's family).  Yes   Patient/family informed of their freedom to choose among providers that offer the needed level of care, that participate in Medicare, Medicaid or managed care program needed by the patient, have an available bed and are willing to accept the patient.  Yes   Patient/family informed of [facility]'s ownership interest in [facility] and [facility], as well as of the fact that they are under no obligation to receive care at these facilities.  PASRR submitted to EDS on       PASRR number received on       Existing PASRR number confirmed on 05/11/16     FL2 transmitted to all facilities in geographic area requested by pt/family on 05/11/16     FL2 transmitted to all facilities within larger geographic area on       Patient informed that his/her managed care company has contracts with or will negotiate with certain facilities, including the following:        Yes   Patient/family informed of bed offers received.  Patient [facility] bed at [facility]     Physician recommends and patient chooses bed at      Patient to be transferred to [facility] on 05/11/16.  Patient to be transferred to facility by       Patient family notified on   of transfer.  Name of family member notified:        PHYSICIAN       Additional Comment:     _______________________________________________ Truitt Merle, LCSW 05/11/2016, 11:42 AM

## 2016-05-11 NOTE — Progress Notes (Signed)
Called to give report to [facility] given to [place]. Patient no complaints od any discomfort at this time. Awaiting transport.

## 2016-05-11 NOTE — Discharge Summary (Signed)
[name] [name] [id number] DOB: [birth year] DOA: 05/07/2016  PCP: [name], MD  Admit date: 05/07/2016  Discharge date: 05/11/2016  Admitted From: Home   Disposition:  SNF   Recommendations for Outpatient Follow-up:   Follow up with PCP in 1-2 weeks  PCP Please obtain BMP/CBC, 2 view CXR in 1week,  (see Discharge instructions)   PCP Please follow up on the following pending results: Monitor BMP &  INR closely, outpatient cardiology follow-up within 1-2 weeks   Home Health: None   Equipment/Devices: None  Consultations: None Discharge Condition: Stable   CODE STATUS: DNR   Diet Recommendation: Diet Heart Room Healthy Fluid restriction: 1500 mL/ day   Chief Complaint  Patient presents with  . multiple complaints     Brief history of present illness from the day of admission and additional interim summary    [name] [name] a 75 y.o.femalewith a past medical history significant for CHF EF 35% on home O2, Afib s/p MAZE on warfarin, bioprosthetic MVR, and HTNwho presents with weakness. In the ER found to be in acute on chronic systolic CHF.                                                                 Hospital Course    1.Acute on chronic combined diastolic and systolic CHF EF [id number] on recent echo. Placed on IV Lasix along with Coreg with good results, she is now symptom-free, down to her baseline 2 L nasal cannula oxygen, no shortness of breath or chest discomfort, renal function does not allow ACE/ARB, , monitor intake output. Continue salt and fluid restriction at [facility], discharged today to SNF with outpatient cardiology follow-up within 1-2 weeks.  Filed Weights   05/09/16 0500 05/10/16 0430 05/11/16 0442  Weight: 79.5 kg (175 lb 4.8 oz) 76.2 kg (168 lb 1.6 oz) 76.4 kg (168 lb 8 oz)     2. Chronic A. fib [place] vasc 2 score of 5. On amiodarone & now added Coreg, Coumadin continue request SNF to check INR or oh, hold tonight's Coumadin dose.  3. History of chronic arthritis, recent left wrist flare. On colchicine continue continue left wrist splint.  4. Hypothyroidism. Continue Synthroid. PCP to monitor thyroid function in the light of amiodarone.  5. Macrocytic anemia. B-12 borderline low, agree with oral supplementation. Outpatient follow with PCP. Note patient's B-12 was over 2000 about 2 years ago. PCP to recheck B-12 levels in 1-2 months.  6. GERD. On PPI  7. Dyslipidemia. On statin continue.  8. 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   Discharge diagnosis     Principal Problem:   Acute on chronic combined systolic and diastolic CHF (congestive heart failure) (HCC) Active Problems:   Essential hypertension   Permanent atrial fibrillation (HCC)   Hypothyroidism, acquired  CKD (chronic kidney disease) stage 3, GFR 30-59 ml/min   Chronic respiratory failure with hypoxia (HCC)   Macrocytic anemia    Discharge instructions    Discharge Instructions    Discharge instructions    Complete by:  As directed    Follow with Primary MD [name], MD in 7 days , check B12 levels in 2 months  Get CBC, CMP, INR, 2 view Chest X ray checked  by SNF MD in 2 days ( we routinely change or add medications that can affect your baseline labs and fluid status, therefore we recommend that you get the mentioned basic workup next visit with your PCP, your PCP may decide not to get them or add new tests based on their clinical decision)  Activity: As tolerated with Full fall precautions use walker/cane & assistance as needed  Disposition Home   Diet:   Diet Heart Healthy Fluid restriction: 1500 mL Fluid / day  For Heart failure patients - Check your Weight same time everyday, if you gain over 2 pounds, or you develop in leg swelling, experience more shortness of breath or chest  pain, call your Primary MD immediately. Follow Cardiac Low Salt Diet and 1.5 lit/day fluid restriction.  On your next visit with your primary care physician please Get Medicines reviewed and adjusted.  Please request your Prim.MD to go over all Hospital Tests and Procedure/Radiological results at the follow up, please get all Hospital records sent to your Prim MD by signing hospital release before you go home.  If you experience worsening of your admission symptoms, develop shortness of breath, life threatening emergency, suicidal or homicidal thoughts you must seek medical attention immediately by calling 911 or calling your MD immediately  if symptoms less severe.  You Must read complete instructions/literature along with all the possible adverse reactions/side effects for all the Medicines you take and that have been prescribed to you. Take any new Medicines after you have completely understood and accpet all the possible adverse reactions/side effects.   Do not drive, operate heavy machinery, perform activities at heights, swimming or participation in water activities or provide baby sitting services if your were admitted for syncope or siezures until you have seen by Primary MD or a Neurologist and advised to do so again.  Do not drive when taking Pain medications.    Do not take more than prescribed Pain, Sleep and Anxiety Medications  Special Instructions: If you have smoked or chewed Tobacco  in the last 2 yrs please stop smoking, stop any regular Alcohol  and or any Recreational drug use.  Wear Seat belts while driving.   Please note  You were cared for by a hospitalist during your hospital stay. If you have any questions about your discharge medications or the care you received while you were in the hospital after you are discharged, you can call the unit and asked to speak with the hospitalist on call if the hospitalist that took care of you is not available. Once you are  discharged, your primary care physician will handle any further medical issues. Please note that NO REFILLS for any discharge medications will be authorized once you are discharged, as it is imperative that you return to your primary care physician (or establish a relationship with a primary care physician if you do not have one) for your aftercare needs so that they can reassess your need for medications and monitor your lab values.   Increase activity slowly    Complete  by:  As directed       Discharge Medications   Allergies as of 05/11/2016      Reactions   Indomethacin Other (See Comments)   dizziness   Norvasc [amlodipine Besylate] Cough      Medication List    STOP taking these medications   torsemide 100 MG tablet Commonly known as:  DEMADEX     TAKE these medications   acetaminophen 325 MG tablet Commonly known as:  TYLENOL Take 650 mg by mouth every 4 (four) hours as needed for moderate pain.   amiodarone 200 MG tablet Commonly known as:  PACERONE Take 0.5 tablets (100 mg total) by mouth daily.   carvedilol 3.125 MG tablet Commonly known as:  COREG Take 1 tablet (3.125 mg total) by mouth 2 (two) times daily with a meal.   colchicine 0.6 MG tablet Take 0.5 tablets (0.3 mg total) by mouth daily.   cyanocobalamin 1000 MCG tablet Take 1 tablet (1,000 mcg total) by mouth daily. Start taking on:  05/12/2016   ferrous sulfate 325 (65 FE) MG tablet Take 325 mg by mouth 2 (two) times daily with a meal.   furosemide 40 MG tablet Commonly known as:  LASIX Take 1 tablet (40 mg total) by mouth 2 (two) times daily.   levothyroxine 50 MCG tablet Commonly known as:  SYNTHROID, LEVOTHROID Take 50 mcg by mouth daily before breakfast.   omeprazole 20 MG capsule Commonly known as:  PRILOSEC Take 20 mg by mouth daily as needed (acid reflux).   potassium chloride SA 20 MEQ tablet Commonly known as:  K-DUR,KLOR-CON Take 2 tablets (40 mEq total) by mouth daily. What changed:   how much to take   simvastatin 10 MG tablet Commonly known as:  ZOCOR TAKE ONE TABLET BY MOUTH EVERY EVENING.   tolterodine 4 MG 24 hr capsule Commonly known as:  DETROL LA Take 4 mg by mouth daily.   traMADol 50 MG tablet Commonly known as:  ULTRAM Take 25-50 mg by mouth every 6 (six) hours.   warfarin 5 MG tablet Commonly known as:  COUMADIN Take 0.5 tablets (2.5 mg total) by mouth daily at 6 PM. Start taking on:  05/12/2016       Follow-up Information    SASSER,PAUL W, MD. Schedule an appointment as soon as possible for a visit in 1 week(s).   Specialty:  Family Medicine Contact information: [place] [address] [phone]        Candee Furbish, MD. Schedule an appointment as soon as possible for a visit in 1 week(s).   Specialty:  Cardiology Contact information: [id number] [place] [address] [phone]           Major procedures and Radiology Reports - PLEASE review detailed and final reports thoroughly  -       TTE feb 2018   Left ventricle: Poor acoustic windows limit study Endocardium isdiffficult to see. Hypokinesis appears to be worse in theinferior/inferoseptal walls. The cavity size was normal. Wallthickness was increased in a pattern of mild LVH. Systolicfunction was moderately reduced. The estimated ejection fractionwas in the range of 35% to 40%. - Aortic valve: AV is functionally bicuspid It is thickened, calcified Peak and mean gradients through the valve are 18 and 11 mm Hg respectively. There was moderate regurgitation. - Mitral valve: MV prosthesis is well seated. Difficult to see leaflets well. Peak and mean gradients through the valve are 12and 3 mm Hg respectively  MVA by P T1/2 is 2 cm2. Valve area bycontinuity equation (using LVOT flow): 2.19 cm^2. - Left atrium: The atrium was mildly dilated. - Right ventricle: Systolic function was mildly to moderately reduced. - Right atrium: The atrium was  mildly dilated. - Pulmonary arteries: PA peak pressure: 66 mm Hg (S).  Impressions:  Compared to report from 2016, LVEF is now depressed.  Dg Chest 2 View  Result Date: 05/07/2016 CLINICAL DATA:  Initial evaluation for hip acute shortness of breath. EXAM: CHEST  2 VIEW COMPARISON:  Prior radiograph from 07/30/2015. FINDINGS: Moderate cardiomegaly, stable. Prostatic bowel noted. Mediastinal silhouette within normal limits. Aortic atherosclerosis. Lungs normally inflated. Diffuse pulmonary vascular congestion with interstitial prominence, compatible with pulmonary interstitial edema. No focal infiltrates identified. No significant pleural effusion. No pneumothorax. No acute osseous abnormality. Remotely healed left-sided rib fractures noted. Diffuse osteopenia. IMPRESSION: 1. Cardiomegaly with mild to moderate diffuse pulmonary interstitial edema. 2. Aortic atherosclerosis. Electronically Signed   By: Jeannine Boga M.D.   On: 05/07/2016 23:10   Dg Chest Port 1 View  Result Date: 05/08/2016 CLINICAL DATA:  Shortness of breath. EXAM: PORTABLE CHEST 1 VIEW COMPARISON:  Radiograph of May 07, 2016. FINDINGS: Stable cardiomegaly. Status post cardiac valve repair. No pneumothorax or significant pleural effusion is noted. Mild central pulmonary vascular congestion is noted. No consolidative process is noted. Bony thorax is unremarkable. IMPRESSION: Stable cardiomegaly and central pulmonary vascular congestion. Electronically Signed   By: Marijo Conception, M.D.   On: 05/08/2016 08:00   Dg Knee Complete 4 Views Left  Result Date: 05/07/2016 CLINICAL DATA:  Initial evaluation for chronic left knee pain. EXAM: LEFT KNEE - COMPLETE 4+ VIEW COMPARISON:  None. FINDINGS: Diffuse osteopenia, somewhat limiting evaluation for possible subtle acute nondisplaced fractures. No acute fracture or dislocation identified. Severe tricompartmental degenerative osteoarthrosis. No significant joint effusion. No soft tissue  abnormality. IMPRESSION: 1. No acute osseous abnormality about the knee. 2. Severe tricompartmental degenerative osteoarthrosis. 3. Diffuse osteopenia. Electronically Signed   By: Jeannine Boga M.D.   On: 05/07/2016 23:12   Dg Hand Complete Left  Result Date: 05/07/2016 CLINICAL DATA:  Initial evaluation for chronic left hand pain. EXAM: LEFT HAND - COMPLETE 3+ VIEW COMPARISON:  None available. FINDINGS: No acute fracture or dislocation. Extensive degenerative osteoarthritic changes present throughout the joints of the hand, most notable at the first [facility] and MCP joints. Diffuse osteopenia. No soft tissue abnormality. IMPRESSION: 1. No acute osseous abnormality about the left hand. 2. Extensive degenerative osteoarthritic changes throughout the joints of the hand, most notable at the first the [facility] and MCP joints. 3. Diffuse osteopenia. Electronically Signed   By: Jeannine Boga M.D.   On: 05/07/2016 23:15    Micro Results    No results found for this or any previous visit (from the past 240 hour(s)).  Today   Subjective    Amauri Helbling today has no headache,no chest abdominal pain,no new weakness tingling or numbness, feels much better   Objective   Blood pressure 102/71, pulse (!) 50, temperature 97.3 F (36.3 C), temperature source Oral, resp. rate 16, height 5' (1.524 m), weight 76.4 kg (168 lb 8 oz), SpO2 99 %.   Intake/Output Summary (Last 24 hours) at 05/11/16 1033 Last data filed at 05/11/16 0900  Gross per 24 hour  Intake              680 ml  Output  0 ml  Net              680 ml    Exam Awake Alert, Oriented x 3, No new F.N deficits, Normal affect [place].AT,PERRAL Supple Neck,No JVD, No cervical lymphadenopathy appriciated.  Symmetrical Chest wall movement, Good air movement bilaterally, CTAB iRRR,No Gallops,Rubs or new Murmurs, No Parasternal Heave +ve B.Sounds, Abd Soft, Non tender, No organomegaly appriciated, No rebound -guarding or  rigidity. No Cyanosis, Clubbing or edema, No new Rash or bruise   Data Review   CBC w Diff: Lab Results  Component Value Date   WBC 3.6 (L) 05/08/2016   HGB 8.7 (L) 05/08/2016   HCT 28.2 (L) 05/08/2016   PLT 138 (L) 05/08/2016   LYMPHOPCT 25 04/30/2016   MONOPCT 11 04/30/2016   EOSPCT 6 04/30/2016   BASOPCT 0 04/30/2016    CMP: Lab Results  Component Value Date   NA 141 05/11/2016   K 4.3 05/11/2016   CL 102 05/11/2016   CO2 33 (H) 05/11/2016   BUN 27 (H) 05/11/2016   CREATININE 1.49 (H) 05/11/2016   PROT 6.6 05/07/2016   ALBUMIN 2.9 (L) 05/07/2016   BILITOT 0.7 05/07/2016   ALKPHOS 116 05/07/2016   AST 23 05/07/2016   ALT 11 (L) 05/07/2016  . Lab Results  Component Value Date   INR 3.53 05/11/2016   INR 3.59 05/10/2016   INR 3.22 05/09/2016     Total Time in preparing paper work, data evaluation and todays exam - 35 minutes  Thurnell Lose M.D on 05/11/2016 at 10:33 AM  [facility]   Office  [phone]

## 2016-05-11 NOTE — [place] FL2 (Signed)
[place] LEVEL OF CARE SCREENING TOOL     IDENTIFICATION  Patient Name: [name] [name] Birthdate: October 29, 1940 Sex: female Admission Date (Current Location): 05/07/2016  [facility] and [place] Number:  Herbalist and Address:  [facility]. [facility] [place] [address] [place], [place] [address]      Provider Number: [id number]  Attending Physician Name and Address:  Thurnell Lose, MD  Relative Name and Phone Number:       Current Level of Care: Hospital Recommended Level of Care: [place] Prior Approval Number:    Date Approved/Denied:   PASRR Number: [id number]  Discharge Plan: SNF    Current Diagnoses: Patient Active Problem List   Diagnosis Date Noted  . Acute on chronic combined systolic and diastolic CHF (congestive heart failure) ([place]) 05/07/2016  . Gastrointestinal hemorrhage 04/29/2016  . Macrocytic anemia   . History of CVA in adulthood   . Pulmonary hypertension 04/10/2016  . Chronic respiratory failure with hypoxia ([place]) 04/10/2016  . TIA (transient ischemic attack) 04/09/2016  . Thrombocytopenia ([place]) 04/09/2016  . Physical deconditioning 04/09/2016  . Transient weakness of left lower extremity   . CKD (chronic kidney disease) stage 3, GFR 30-59 ml/min 06/25/2015  . CAP (community acquired pneumonia) 11/02/2014  . Anemia, iron deficiency 05/09/2014  . UTI (urinary tract infection) 05/09/2014  . Obesity 05/08/2014  . Hypothyroidism, acquired 05/08/2014  . Fx lumbar vertebra-closed ([place]) 11/12/2013  . Lumbar vertebral fracture ([place]) 11/11/2013  . DNR (do not resuscitate) 10/23/2013  . Anemia in chronic kidney disease 10/20/2013  . Acute on chronic diastolic heart failure ([place]) 10/14/2013  . Long term current use of anticoagulant therapy 06/22/2013  . Chronic combined systolic and diastolic CHF (congestive heart failure) ([place]) 06/18/2013  . CVA (cerebral infarction) 05/21/2013  . Acute pulmonary  edema ([place]) 05/18/2013  . 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 S/P Maze operation for atrial fibrillation 05/16/2013  . Aortic insufficiency due to bicuspid aortic valve   . Permanent atrial fibrillation ([place])   . Arthritis   . Acute respiratory failure ([place]) 05/02/2013  . Juvenile rheumatic fever   . Mitral regurgitation 02/16/2013  . Essential hypertension 02/15/2011    Orientation RESPIRATION BLADDER Height & Weight     Self, Time, Situation, Place  O2 (2L) Incontinent Weight: 168 lb 8 oz (76.4 kg) Height:  5' (152.4 cm)  BEHAVIORAL SYMPTOMS/MOOD NEUROLOGICAL BOWEL NUTRITION STATUS      Continent Diet (Heart healthy; thin liquids (1500 ML restriction))  AMBULATORY STATUS COMMUNICATION OF NEEDS Skin   Extensive Assist Verbally Normal                       Personal Care Assistance Level of Assistance  Bathing, Feeding, Dressing Bathing Assistance: Maximum assistance Feeding assistance: Limited assistance Dressing Assistance: Maximum assistance     Functional Limitations Info  Sight, Hearing, Speech Sight Info: Adequate Hearing Info: Adequate Speech Info: Adequate    SPECIAL CARE FACTORS FREQUENCY  PT (By licensed PT)     PT Frequency: 5              Contractures Contractures Info: Present    Additional Factors Info  Code Status, Allergies Code Status Info: DNR Allergies Info: Indomethacin, Norvasc Amlodipine Besylate           Current Medications (05/11/2016):  This is the current hospital active medication list Current Facility-Administered Medications  Medication Dose Route Frequency Provider Last Rate Last Dose  . acetaminophen (TYLENOL) tablet 650 mg  650 mg Oral Q4H PRN Edwin Dada, MD   650 mg at 05/10/16 1729  . amiodarone (PACERONE) tablet 100 mg  100 mg Oral Daily Edwin Dada, MD   100 mg at 05/10/16 0951  . carvedilol (COREG) tablet 3.125 mg  3.125 mg  Oral BID WC Thurnell Lose, MD   3.125 mg at 05/11/16 0547  . colchicine tablet 0.3 mg  0.3 mg Oral Daily Edwin Dada, MD   0.3 mg at 05/10/16 0951  . ferrous sulfate tablet 325 mg  325 mg Oral BID WC Edwin Dada, MD   325 mg at 05/11/16 0547  . fesoterodine (TOVIAZ) tablet 4 mg  4 mg Oral Daily Edwin Dada, MD   4 mg at 05/10/16 0951  . furosemide (LASIX) injection 40 mg  40 mg Intravenous BID Edwin Dada, MD   40 mg at 05/10/16 1634  . levothyroxine (SYNTHROID, LEVOTHROID) tablet 50 mcg  50 mcg Oral QAC breakfast Edwin Dada, MD   50 mcg at 05/11/16 0540  . pantoprazole (PROTONIX) EC tablet 40 mg  40 mg Oral Daily Edwin Dada, MD   40 mg at 05/10/16 0951  . potassium chloride SA (K-DUR,KLOR-CON) CR tablet 40 mEq  40 mEq Oral Daily Edwin Dada, MD   40 mEq at 05/10/16 0951  . simvastatin (ZOCOR) tablet 10 mg  10 mg Oral QPM Edwin Dada, MD   10 mg at 05/10/16 1725  . traMADol (ULTRAM) tablet 25-50 mg  25-50 mg Oral Q4H PRN Edwin Dada, MD   50 mg at 05/09/16 2251  . vitamin B-12 (CYANOCOBALAMIN) tablet 1,000 mcg  1,000 mcg Oral Daily Edwin Dada, MD   1,000 mcg at 05/10/16 0951  . Warfarin - Pharmacist Dosing Inpatient   Does not apply [id number] Thurnell Lose, MD   Stopped at 05/10/16 1800     Discharge Medications: Please see discharge summary for a list of discharge medications.  Relevant Imaging Results:  Relevant Lab Results:   Additional Information SSN: [id number]  Truitt Merle, LCSW

## 2016-05-11 NOTE — Clinical Social Work Note (Signed)
CSW facilitated patient discharge including contacting facility to confirm patient discharge plans. Patient declined having CSW call family and said that she would call her sister. Clinical information faxed to facility and family agreeable with plan. CSW arranged ambulance transport via [facility] to [facility] RN to call report prior to discharge [phone]).  CSW will sign off for now as social work intervention is no longer needed. Please consult [place] again if new needs arise.  [place] Scrape, [place]

## 2016-05-11 NOTE — Discharge Instructions (Signed)
Follow with Primary MD [name], MD in 7 days , check B12 levels in 2 months  Get CBC, CMP, 2 view Chest X ray checked  by Primary MD or SNF MD in 5-7 days ( we routinely change or add medications that can affect your baseline labs and fluid status, therefore we recommend that you get the mentioned basic workup next visit with your PCP, your PCP may decide not to get them or add new tests based on their clinical decision)  Activity: As tolerated with Full fall precautions use walker/cane & assistance as needed  Disposition Home   Diet:   Diet Heart Healthy Fluid restriction: 1500 mL Fluid / day  For Heart failure patients - Check your Weight same time everyday, if you gain over 2 pounds, or you develop in leg swelling, experience more shortness of breath or chest pain, call your Primary MD immediately. Follow Cardiac Low Salt Diet and 1.5 lit/day fluid restriction.  On your next visit with your primary care physician please Get Medicines reviewed and adjusted.  Please request your Prim.MD to go over all Hospital Tests and Procedure/Radiological results at the follow up, please get all Hospital records sent to your Prim MD by signing hospital release before you go home.  If you experience worsening of your admission symptoms, develop shortness of breath, life threatening emergency, suicidal or homicidal thoughts you must seek medical attention immediately by calling 911 or calling your MD immediately  if symptoms less severe.  You Must read complete instructions/literature along with all the possible adverse reactions/side effects for all the Medicines you take and that have been prescribed to you. Take any new Medicines after you have completely understood and accpet all the possible adverse reactions/side effects.   Do not drive, operate heavy machinery, perform activities at heights, swimming or participation in water activities or provide baby sitting services if your were admitted for  syncope or siezures until you have seen by Primary MD or a Neurologist and advised to do so again.  Do not drive when taking Pain medications.    Do not take more than prescribed Pain, Sleep and Anxiety Medications  Special Instructions: If you have smoked or chewed Tobacco  in the last 2 yrs please stop smoking, stop any regular Alcohol  and or any Recreational drug use.  Wear Seat belts while driving.   Please note  You were cared for by a hospitalist during your hospital stay. If you have any questions about your discharge medications or the care you received while you were in the hospital after you are discharged, you can call the unit and asked to speak with the hospitalist on call if the hospitalist that took care of you is not available. Once you are discharged, your primary care physician will handle any further medical issues. Please note that NO REFILLS for any discharge medications will be authorized once you are discharged, as it is imperative that you return to your primary care physician (or establish a relationship with a primary care physician if you do not have one) for your aftercare needs so that they can reassess your need for medications and monitor your lab values.

## 2016-05-11 NOTE — Consult Note (Signed)
   [facility] Inpatient Consult   05/11/2016  [name] [birth year] [id number]  Patient was assessed for [facility] for community services. Patient was previously active with [facility]. Chart review reveals the patient is [name] Foulk McDanielis a 76 y.o.femalewith a past medical history significant for CHF EF 35% on home O2, Afib s/p MAZE on warfarin, bioprosthetic MVR, and HTNwho presents with weakness. In the ER found to be in acute on chronic systolic CHF.  Met with patient at bedside regarding being restarted with [facility] services. Patient states she is going to a skilled facility and was hopeful for [facility].  Consent form signed and on file. Patient listed with [facility].     Patient will have 24 hour nursing care.  She states she was hoping to be there long-term. A brochure with contact information left at the bedside.   For additional questions or referrals please contact:  Natividad Brood, RN BSN [facility] Liaison  [phone] business mobile phone Toll free office [phone]

## 2016-05-11 NOTE — Clinical Social Work Note (Addendum)
Clinical Social Work Assessment  Patient Details  Name: [name] [name] MRN: [id number] Date of Birth: [birth year]  Date of referral:  05/11/16               Reason for consult:  Discharge Planning                Permission sought to share information with:  Family Supports, Customer service manager Permission granted to share information::  Yes, Verbal Permission Granted  Name::     [name] [name]  Agency::     Relationship::  Daughter  Contact Information:  [phone]  Housing/Transportation Living arrangements for the past 2 months:  [place] of Information:  Patient Patient Interpreter Needed:  None Criminal Activity/Legal Involvement Pertinent to Current Situation/Hospitalization:  No - Comment as needed Significant Relationships:  Adult Children Lives with:  Self Do you feel safe going back to the place where you live?  Yes Need for family participation in patient care:  Yes (Comment)  Care giving concerns:  No caregiving needs identified.    Social Worker assessment / plan:  CSW met with pt yesterday to address consult for SNF placement. CSW introduced herself and explained role of social work. Pt agreeable to SNF for STR. Pt prefers [facility] and is not willing to go to any other SNF. CSW sent FL-2. CSW communicated with [name] [name] (admissions) at [facility] who confirmed they would offer a bed. However, [name] [name] discussed with pt's dtr-[name] [name], and asked that CSW reiterate that pt is in her co-pay days and would need to pay upfront.    CSW spoke with [name] [name] to confirm awareness of upfront co-pay. [name] [name] was aware she would need to pay and agreeable, but not aware of exact amount. [name] [name] stated she cannot care for pt, as [name] [name] is disabled and has her own health issues. [name] [name] also stated she has spoken with Insurance account manager at [employer], but has not yet completed a LTC Medicaid application. CSW informed that Medicaid would not start paying right away  and encouraged to actaully apply for Medicaid as soon as possible, if wanting that benefit for pt. [name] [name] expressed guilt of having to place pt in SNF. CSW provided supportive counseling. [name] [name] thankful of CSW assistance. [employer] Medicare auth submitted. CSW will continue to follow for d/c needs.    Employment status:  Retired Forensic scientist:  Other (Comment Required) Actor) PT Recommendations:  [place] / Referral to community resources:  [place]  Patient/Family's Response to care: Pt and daughter appreciative of CSW support.   Patient/Family's Understanding of and Emotional Response to Diagnosis, Current Treatment, and Prognosis:  Pt understands that she will benefit from rehab prior to returning home.   Emotional Assessment Appearance:  Appears stated age Attitude/Demeanor/Rapport:   (Appropriate) Affect (typically observed):  Accepting Orientation:  Oriented to Self, Oriented to Place, Oriented to Situation, Oriented to  Time Alcohol / Substance use:  Other Psych involvement (Current and /or in the community):  No (Comment)  Discharge Needs  Concerns to be addressed:  Care Coordination Readmission within the last 30 days:  Yes Current discharge risk:  Dependent with Mobility Barriers to Discharge:  Continued Medical Work up   [employer], LCSW 05/11/2016, 10:52 AM

## 2016-05-11 NOTE — Care Management Important Message (Signed)
Important Message  Patient Details  Name: [name] [name] MRN: [id number] Date of Birth: 13-May-1940   Medicare Important Message Given:  Yes    [name] [address]ine [address] 05/11/2016, 11:20 AM

## 2016-05-11 NOTE — Progress Notes (Signed)
Patient discharged to [facility]. Patient stable and personal items sent with patient.

## 2016-05-12 ENCOUNTER — Other Ambulatory Visit: Payer: Self-pay

## 2016-05-12 DIAGNOSIS — I5033 Acute on chronic diastolic (congestive) heart failure: Secondary | ICD-10-CM

## 2016-05-12 DIAGNOSIS — I5043 Acute on chronic combined systolic (congestive) and diastolic (congestive) heart failure: Secondary | ICD-10-CM | POA: Diagnosis not present

## 2016-05-13 ENCOUNTER — Other Ambulatory Visit: Payer: Self-pay | Admitting: Licensed Clinical Social Worker

## 2016-05-13 ENCOUNTER — Encounter: Payer: Self-pay | Admitting: Licensed Clinical Social Worker

## 2016-05-13 NOTE — Patient Outreach (Signed)
Assessment:    CSW received referral on [name] [name]. CSW completed chart review on client on 05/13/16. Client sees Dr. Consuello Masse as primary care doctor.  Client has support from her daughter, [name] [name]. Client has been hospitalized recently and is now receiving care at a skilled nursing facility in [place], [place].  Client is receiving nursing support and physical therapy support sessions as scheduled for client at nursing facility.  CSW called [name] [name], daughter of client, on 05/13/16. CSW spoke via phone with [name] [name] on 05/13/16. CSW verified identity of [name] [name]. [name] [name] gave CSW verbal permission on 05/13/16 for CSW to speak wit [name] [name] about current status and needs of client.  [name] [name] said client is receiving care at [facility] currently.  Client uses oxygen as prescribed at [facility] facility.  [name] [name] said she has a scheduled meeting this week with facility care providers regarding client status.  Client has [name] [name].  [name] [name] said she has applied for Medicaid coverage for client for long term care Medicaid. [name] [name] said client has difficulty standing from a sitting position. [name] [name] said client fatigues easily.  [name] [name] said that she feels that client may have to remain at [facility] facility for a while due to current client needs.  CSW and [name] [name] completed needed [facility] assessments on client. CSW and [name] [name] spoke of client care plan. CSW encouraged that client participate in all scheduled client physical therapy sessions for client at facility for next 30 days. CSW thanked [place] for phone call with CSW on 05/13/16.  CSW encouraged that client or [name] [name] call CSW at [phone] as needed to discuss social work needs of client. [name] [name] was appreciative of call from [facility] CSW on 05/13/16.    Plan:  Client to participate in all scheduled client physical therapy sessions for client in the next 30 days at skilled nursing facility.  CSW to call client or [name] [name] in 3 weeks to assess client needs.  [name].Sheron Tallman MSW, LCSW Licensed Clinical Social Worker [facility] Care Management [phone]

## 2016-05-19 ENCOUNTER — Other Ambulatory Visit: Payer: Self-pay | Admitting: Licensed Clinical Social Worker

## 2016-05-19 NOTE — Patient Outreach (Signed)
[facility]) Care Management  [facility] Social Work  05/19/2016  ALEXXIS MACKERT 04-12-1940 [id number]  Subjective:    Objective:   Encounter Medications:  Outpatient Encounter Prescriptions as of 05/19/2016  Medication Sig  . acetaminophen (TYLENOL) 325 MG tablet Take 650 mg by mouth every 4 (four) hours as needed for moderate pain.   Marland Kitchen amiodarone (PACERONE) 200 MG tablet Take 0.5 tablets (100 mg total) by mouth daily.  . carvedilol (COREG) 3.125 MG tablet Take 1 tablet (3.125 mg total) by mouth 2 (two) times daily with a meal.  . colchicine 0.6 MG tablet Take 0.5 tablets (0.3 mg total) by mouth daily.  . ferrous sulfate 325 (65 FE) MG tablet Take 325 mg by mouth 2 (two) times daily with a meal.  . furosemide (LASIX) 40 MG tablet Take 1 tablet (40 mg total) by mouth 2 (two) times daily.  Marland Kitchen levothyroxine (SYNTHROID, LEVOTHROID) 50 MCG tablet Take 50 mcg by mouth daily before breakfast.  . omeprazole (PRILOSEC) 20 MG capsule Take 20 mg by mouth daily as needed (acid reflux).   . potassium chloride SA (K-DUR,KLOR-CON) 20 MEQ tablet Take 2 tablets (40 mEq total) by mouth daily.  . simvastatin (ZOCOR) 10 MG tablet TAKE ONE TABLET BY MOUTH EVERY EVENING.  Marland Kitchen tolterodine (DETROL LA) 4 MG 24 hr capsule Take 4 mg by mouth daily.  . traMADol (ULTRAM) 50 MG tablet Take 25-50 mg by mouth every 6 (six) hours.   . vitamin B-12 1000 MCG tablet Take 1 tablet (1,000 mcg total) by mouth daily.  Marland Kitchen warfarin (COUMADIN) 5 MG tablet Take 0.5 tablets (2.5 mg total) by mouth daily at 6 PM.   No facility-administered encounter medications on file as of 05/19/2016.     Functional Status:  In your present state of health, do you have any difficulty performing the following activities: 05/13/2016 05/08/2016  Hearing? N N  Vision? N N  Difficulty concentrating or making decisions? Y N  Walking or climbing stairs? Y Y  Dressing or bathing? Y N  Doing errands, shopping? Y N  Preparing Food and  eating ? Y -  Using the Toilet? N -  In the past six months, have you accidently leaked urine? N -  Do you have problems with loss of bowel control? N -  Managing your Medications? Y -  Managing your Finances? Y -  Housekeeping or managing your Housekeeping? Y -  Some recent data might be hidden    Fall/Depression Screening:  PHQ 2/9 Scores 05/13/2016 04/09/2016 07/02/2015 11/15/2014 08/19/2014 06/07/2014  PHQ - 2 Score 2 0 0 0 0 0  PHQ- 9 Score 7 - - - - -    Assessment:   CSW traveled to [facility] in [place], [place] on 05/19/16. CSW met with client on 05/19/16 at client's room at [facility].   Patient assessed in  [place] for continued care needs. CSW will continue to collaborate with the skilled nursing facility social worker to facilitate discharge planning needs and communicate with the patient and family.  CSW introduced self and talked with client about [facility] program services. Client said she is receiving nursing care and physical therapy sessions, as scheduled, at facility. Client is receiving prescribed medications for client as scheduled.   Client and CSW spoke of client care plan CSW encouraged client to participate in all scheduled client physical therapy sessions for client in next 30 days at facility.   CSW and client spoke of  client care issues. Client said she is eating well. She is sleeping adequately. Client has oxygen concentrator at home; she has rollator walker at home. Client has hospital bed at home.  Client has ramp built at her home. Client is walking via use of a walker at nursing facility. She said she hopes to be at nursing facility for a while and when ready, to discharge home with needed supports in place. CSW encouraged client to talk with facility discharge planner, Roanna Epley, to finalize client discharge plan,  Client said she had received home health services previously in her home with [place].  CSW thanked client for allowing CSW  to visit client on 05/19/16. After visit with client, CSW met with Roanna Epley, facility discharge planner and talked with Roanna Epley about current client needs and status. CSW talked with Roanna Epley about [facility] program support services in the community. CSW gave [place] CSW card with client name and phone number on card.  CSW also had given client [facility] CSW card for client use.   Plan:   Client to participate in all scheduled client physical therapy sessions for client in next 30 days at facility.  CSW to call client or [name], daughter of client, in 3 weeks to assess client needs at that time.  [name].Karma Hiney MSW, LCSW Licensed Clinical Social Worker [facility] Care Management [phone]

## 2016-05-23 DIAGNOSIS — I5043 Acute on chronic combined systolic (congestive) and diastolic (congestive) heart failure: Secondary | ICD-10-CM | POA: Diagnosis not present

## 2016-05-24 ENCOUNTER — Ambulatory Visit: Payer: Medicare HMO | Admitting: Adult Health

## 2016-05-24 ENCOUNTER — Ambulatory Visit (INDEPENDENT_AMBULATORY_CARE_PROVIDER_SITE_OTHER): Payer: Medicare HMO | Admitting: Cardiology

## 2016-05-24 VITALS — BP 144/85 | HR 69 | Ht 62.0 in

## 2016-05-24 DIAGNOSIS — I4891 Unspecified atrial fibrillation: Secondary | ICD-10-CM

## 2016-05-24 DIAGNOSIS — Z953 Presence of xenogenic heart valve: Secondary | ICD-10-CM

## 2016-05-24 DIAGNOSIS — N184 Chronic kidney disease, stage 4 (severe): Secondary | ICD-10-CM

## 2016-05-24 DIAGNOSIS — I10 Essential (primary) hypertension: Secondary | ICD-10-CM | POA: Diagnosis not present

## 2016-05-24 DIAGNOSIS — I5022 Chronic systolic (congestive) heart failure: Secondary | ICD-10-CM

## 2016-05-24 MED ORDER — FUROSEMIDE 40 MG PO TABS: ORAL_TABLET | ORAL | 3 refills | Status: AC

## 2016-05-24 NOTE — Progress Notes (Signed)
Clinical Summary [name] [name] is a 76 y.o.female seen today for follow up of the following medical problem.s    1. Mitral regurgitation  - hx of rheumatic fever, developed severe MR  - MVR (67mm Edwards Magna Mitral bovine bioprosthetic tissue valve) 05/16/2013 with combined MAZE procedure  - echo 06/05/13 with LVEF 40-45%, normal functioning MVR  - echo 05/2014 LVEF 50-55%, normal functioning MVR, mild AS, mod MR - 04/2016 echo dificult study, LVEF 35-40%, mild AS, mod AI, normal MVR. PASP 66  - denies any recent SOB or DOE.     2. Combined Systolic/ diastolic heart failure  - echo last year showed mildly LV systolic dysfunction, repeat 05/2014 shows low normal function at 50-55%. Abnormal diastolic function, indeterminate grade.  - she is not on ACE-I due to CKD    - admit 05/2016 with acute on chronic combined systolic/diastoilc HF - IV diuresed with resolution of symptoms. Discharged to SNF. Discharge weight 168 lbs, 166-168 lbs - breahting back to baseline. Slow uptrend in weights since discharge based on her home numbers, mild increase in edema.     3. Paroxysmal afib  - MAZE procedure during MVR 05/16/2013  - after Maze procedure episode of aflutter, underwent DCCV 06/06/13 for aflutter, remains on amio and coumadin.  - . Has had some troubles with nosebleeds requiring cautery by ENT in the past. Most recently see in ER 06/2014 with nose bleed, INR was 1.92 at that time.    - no recent palpitations. NO bleeding troubles  4. CKD Stage IV  5. HTN - she is compliant with meds    Past Medical History:  Diagnosis Date  . Aortic insufficiency due to bicuspid aortic valve    Moderate by TEE  . Arthritis   . Chronic diastolic congestive heart failure ([place])    a) ECHO (09/2013) EF 50-55%  . Chronic respiratory failure with hypoxia (HCC) 04/10/2016   On 2 L nasal cannula oxygen chronically.  . CKD (chronic kidney disease) stage 4, GFR 15-29 ml/min  (HCC)   . COPD (chronic obstructive pulmonary disease) ([place])   . Essential hypertension, benign   . History of stroke    2000 [place]  . History of TIA (transient ischemic attack)    Diagnosed 2003  . Hyperlipidemia   . Juvenile rheumatic fever    Age 36  . 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 NICM (nonischemic cardiomyopathy) ([place])    a) LHC (04/2013) no significant CAD  . Obesity   . Paroxysmal atrial fibrillation (HCC)    s/p MAZE (05/2013)  . Pulmonary hypertension 04/10/2016   Peak pressure 66 mmHg, Echo 04/09/2016  . S/P Maze operation for atrial fibrillation 05/16/2013   Complete bilateral atrial lesion set using cryothermy and bipolar radiofrequency ablation with oversewing of LA appendage  . 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 Systolic dysfunction without heart failure 04/10/2016   EF 35-40%, ECHO 04/09/2016     Allergies  Allergen Reactions  . Indomethacin Other (See Comments)    dizziness  . Norvasc [Amlodipine Besylate] Cough     Current Outpatient Prescriptions  Medication Sig Dispense Refill  . acetaminophen (TYLENOL) 325 MG tablet Take 650 mg by mouth every 4 (four) hours as needed for moderate pain.     Marland Kitchen amiodarone (PACERONE) 200 MG tablet Take  0.5 tablets (100 mg total) by mouth daily. 30 tablet 6  . carvedilol (COREG) 3.125 MG tablet Take 1 tablet (3.125 mg total) by mouth 2 (two) times daily with a meal.    . colchicine 0.6 MG tablet Take 0.5 tablets (0.3 mg total) by mouth daily. 30 tablet 0  . ferrous sulfate 325 (65 FE) MG tablet Take 325 mg by mouth 2 (two) times daily with a meal.    . furosemide (LASIX) 40 MG tablet Take 1 tablet (40 mg total) by mouth 2 (two) times daily. 30 tablet   . levothyroxine (SYNTHROID, LEVOTHROID) 50 MCG tablet Take 50 mcg by mouth daily before  breakfast.    . omeprazole (PRILOSEC) 20 MG capsule Take 20 mg by mouth daily as needed (acid reflux).     . potassium chloride SA (K-DUR,KLOR-CON) 20 MEQ tablet Take 2 tablets (40 mEq total) by mouth daily.    . simvastatin (ZOCOR) 10 MG tablet TAKE ONE TABLET BY MOUTH EVERY EVENING. 30 tablet 3  . tolterodine (DETROL LA) 4 MG 24 hr capsule Take 4 mg by mouth daily.    . traMADol (ULTRAM) 50 MG tablet Take 25-50 mg by mouth every 6 (six) hours.     . vitamin B-12 1000 MCG tablet Take 1 tablet (1,000 mcg total) by mouth daily.    Marland Kitchen warfarin (COUMADIN) 5 MG tablet Take 0.5 tablets (2.5 mg total) by mouth daily at 6 PM. 30 tablet 0   No current facility-administered medications for this visit.      Past Surgical History:  Procedure Laterality Date  . ABDOMINAL HYSTERECTOMY     Cervical Cancer  . CARDIOVERSION N/A 06/06/2013   Procedure: CARDIOVERSION;  Surgeon: Larey Dresser, MD;  Location: [place];  Service: Cardiovascular;  Laterality: N/A;  . COLONOSCOPY N/A 05/01/2016   Procedure: COLONOSCOPY;  Surgeon: Daneil Dolin, MD;  [facility];  Service: Endoscopy;  Laterality: N/A;  . ESOPHAGOGASTRODUODENOSCOPY N/A 04/30/2016   Procedure: ESOPHAGOGASTRODUODENOSCOPY (EGD);  Surgeon: Danie Binder, MD;  [facility];  Service: Endoscopy;  Laterality: N/A;  . INTRAOPERATIVE TRANSESOPHAGEAL ECHOCARDIOGRAM N/A 05/16/2013   Procedure: INTRAOPERATIVE TRANSESOPHAGEAL ECHOCARDIOGRAM;  Surgeon: Rexene Alberts, MD;  Location: [place];  Service: Open Heart Surgery;  Laterality: N/A;  . LEFT AND RIGHT HEART CATHETERIZATION WITH CORONARY ANGIOGRAM N/A 05/04/2013   Procedure: LEFT AND RIGHT HEART CATHETERIZATION WITH CORONARY ANGIOGRAM;  Surgeon: Jettie Booze, MD;  Location[facility];  Service: Cardiovascular;  Laterality: N/A;  . MINIMALLY INVASIVE MAZE PROCEDURE N/A 05/16/2013   Procedure: MINIMALLY INVASIVE MAZE PROCEDURE;  Surgeon: Rexene Alberts, MD;  Location: [place];   Service: Open Heart Surgery;  Laterality: N/A;  . MITRAL VALVE REPLACEMENT Right 05/16/2013   Procedure: MINIMALLY INVASIVE MITRAL VALVE (MV) REPLACEMENT;  Surgeon: Rexene Alberts, MD;  Location: [place];  Service: Open Heart Surgery;  Laterality: Right;  . Parathyroid/Thyroid surgery     Tumor  . POLYPECTOMY  05/01/2016   Procedure: POLYPECTOMY;  Surgeon: Daneil Dolin, MD;  [facility];  Service: Endoscopy;;  hepatic flexure  x2; cecal x2; descending x1  . TEE WITHOUT CARDIOVERSION N/A 04/06/2013   Procedure: TRANSESOPHAGEAL ECHOCARDIOGRAM (TEE);  Surgeon: [name], MD;  [facility];  Service: Cardiology;  Laterality: N/A;  . TEE WITHOUT CARDIOVERSION N/A 06/06/2013   Procedure: TRANSESOPHAGEAL ECHOCARDIOGRAM (TEE);  Surgeon: Larey Dresser, MD;  Location: [place];  Service: Cardiovascular;  Laterality: N/A;  .  TOTAL KNEE ARTHROPLASTY Right      Allergies  Allergen Reactions  . Indomethacin Other (See Comments)    dizziness  . Norvasc [Amlodipine Besylate] Cough      Family History  Problem Relation Age of Onset  . Heart failure Father   . Heart attack Brother      Social History Ms. Jezewski reports that she has never smoked. She has never used smokeless tobacco. Ms. Dubach reports that she does not drink alcohol.   Review of Systems CONSTITUTIONAL: No weight loss, fever, chills, weakness or fatigue.  HEENT: Eyes: No visual loss, blurred vision, double vision or yellow sclerae.No hearing loss, sneezing, congestion, runny nose or sore throat.  SKIN: No rash or itching.  CARDIOVASCULAR: per hpi RESPIRATORY: No shortness of breath, cough or sputum.  GASTROINTESTINAL: No anorexia, nausea, vomiting or diarrhea. No abdominal pain or blood.  GENITOURINARY: No burning on urination, no polyuria NEUROLOGICAL: No headache, dizziness, syncope, paralysis, ataxia, numbness or tingling in the extremities. No change in bowel or bladder control.    MUSCULOSKELETAL: No muscle, back pain, joint pain or stiffness.  LYMPHATICS: No enlarged nodes. No history of splenectomy.  PSYCHIATRIC: No history of depression or anxiety.  ENDOCRINOLOGIC: No reports of sweating, cold or heat intolerance. No polyuria or polydipsia.  Marland Kitchen   Physical Examination Vitals:   05/24/16 1606  BP: (!) 144/85  Pulse: 69   Vitals:   05/24/16 1606  Height: 5\' 2"  (1.575 m)    Gen: resting comfortably, no acute distress HEENT: no scleral icterus, pupils equal round and reactive, no palptable cervical adenopathy,  CV: RRR, no m/r/g, no jvd Resp: Clear to auscultation bilaterally GI: abdomen is soft, non-tender, non-distended, normal bowel sounds, no hepatosplenomegaly MSK: extremities are warm, trace bilateral LE edema Skin: warm, no rash Neuro:  no focal deficits Psych: appropriate affect   Diagnostic Studies 06/05/13 Echo Procedure narrative: Transthoracic echocardiography. Poor endocardial definition. Intravenous contrast (Definity) was administered. - Left ventricle: The cavity size was normal. Wall thickness was normal. Systolic function was mildly to moderately reduced. The estimated ejection fraction was in the range of 40% to 45%. LV diastolic function cannot be assessed due to the prosthetic mitral valve. - Aortic valve: Poorly visualized. Mildly calcified leaflets. Transvalvular velocity was minimally increased. Mild regurgitation. - Mitral valve: Bioprosthetic mitral valve. Leaflets not well-visualized. Appears to be stable. Peak and mean gradients of 14 and 6 mmHg across the valve. Trivial regurgitation. Valve area by continuity equation (using LVOT flow): 1.33cm^2. - Right ventricle: The cavity size was mildly dilated. Systolic function is mildly reduced. - Right atrium: The atrium was normal in size. - Tricuspid valve: Mild regurgitation. - Pulmonary arteries: PA peak pressure: 83mm Hg (S). - Inferior vena cava: The vessel was  dilated; the respirophasic diameter changes were blunted (<50%); findings are consistent with elevated central venous pressure. - Pericardium, extracardiac: There was no pericardial effusion. 06/06/13 TEE Study Conclusions  - Left ventricle: The cavity size was normal. Wall thickness was increased in a pattern of mild LVH. The estimated ejection fraction was 45%. Diffuse hypokinesis. - Aortic valve: Bicuspid. There was no stenosis. Mild to moderate regurgitation. - Mitral valve: Bioprosthetic mitral valve with trivial regurgitation and no significant stenosis. Pressure half-time: 49ms. - Left atrium: Ligated at prior surgery but there was still some flow into the appendage. No thrombus noted in appendage. The atrium was mildly dilated. - Right ventricle: The cavity size was normal. Systolic function was mildly reduced. - Right atrium: No  evidence of thrombus in the atrial cavity or appendage. - Tricuspid valve: Moderate regurgitation. Peak RV-RA gradient: 2mm Hg (S). Impressions:  - May proceed to [place].   05/2014 echo Study Conclusions  - Left ventricle: The cavity size was normal. There was mild concentric hypertrophy. Systolic function was normal. The estimated ejection fraction was in the range of 50% to 55%. Images were inadequate for LV wall motion assessment. Diastolic dysfunction, indeterminate grade. - Aortic valve: Mildly calcified annulus. Trileaflet; mildly thickened, mildly calcified leaflets. Mild aortic stenosis. Peak velocity 2.28 m/s. Mean gradient 12 mmHg. There was moderate regurgitation. - Mitral valve: Normally functioning bioprosthetic mitral valve. There was no significant regurgitation. - Left atrium: The atrium was moderately dilated. Volume/bsa, S: 38.2 ml/m^2. - Right ventricle: Systolic function was mildly reduced. - Right atrium: The atrium was mildly dilated. - Tricuspid valve: There was mild-moderate  regurgitation. - Pulmonary arteries: PA peak pressure: 38 mm Hg (S). Mildly elevated pulmonary pressures.     Assessment and Plan   1. Mitral regurigation - s/p MVR with tissue valve. - no recent symptoms, continue to monitor.   2. Chronic systolic/diastolic heart failure - medical therapy limited due to low blood pressures, not on beta blocker. No ACE due to severe CKD - mild uptrend in weights and edema, increase lasix to 60mg  in AM and 40mg  in PM, check BMET/Mg in 1 week  3. Afib - no  symptoms.  - CHADS2Vasc score of 4 along with afib in setting of prosthetic MV, continue anticoag.    4. HTN  her bp is at goal,continue current meds     [name], M.D.

## 2016-05-24 NOTE — Patient Instructions (Signed)
Your physician recommends that you schedule a follow-up appointment in: 2 MONTHS WITH DR. BRANCH.  Your physician has recommended you make the following change in your medication: TAKE LASIX 60 MG IN THE MORNING AND 40 MG IN THE EVENING.   Your physician recommends that you return for lab work in: [place]. BMET, MG

## 2016-06-03 ENCOUNTER — Ambulatory Visit: Payer: Medicare HMO | Admitting: Licensed Clinical Social Worker

## 2016-06-07 DIAGNOSIS — R531 Weakness: Secondary | ICD-10-CM | POA: Diagnosis not present

## 2016-06-07 DIAGNOSIS — R6889 Other general symptoms and signs: Secondary | ICD-10-CM | POA: Diagnosis not present

## 2016-06-07 DIAGNOSIS — E876 Hypokalemia: Secondary | ICD-10-CM | POA: Diagnosis not present

## 2016-06-07 DIAGNOSIS — I519 Heart disease, unspecified: Secondary | ICD-10-CM | POA: Diagnosis not present

## 2016-06-07 DIAGNOSIS — J9601 Acute respiratory failure with hypoxia: Secondary | ICD-10-CM | POA: Diagnosis not present

## 2016-06-07 DIAGNOSIS — R404 Transient alteration of awareness: Secondary | ICD-10-CM | POA: Diagnosis not present

## 2016-06-07 DIAGNOSIS — Z8249 Family history of ischemic heart disease and other diseases of the circulatory system: Secondary | ICD-10-CM | POA: Diagnosis not present

## 2016-06-07 DIAGNOSIS — J189 Pneumonia, unspecified organism: Secondary | ICD-10-CM | POA: Diagnosis not present

## 2016-06-07 DIAGNOSIS — E039 Hypothyroidism, unspecified: Secondary | ICD-10-CM | POA: Diagnosis not present

## 2016-06-07 DIAGNOSIS — J181 Lobar pneumonia, unspecified organism: Secondary | ICD-10-CM | POA: Diagnosis not present

## 2016-06-07 DIAGNOSIS — I4891 Unspecified atrial fibrillation: Secondary | ICD-10-CM | POA: Diagnosis not present

## 2016-06-07 DIAGNOSIS — I130 Hypertensive heart and chronic kidney disease with heart failure and stage 1 through stage 4 chronic kidney disease, or unspecified chronic kidney disease: Secondary | ICD-10-CM | POA: Diagnosis not present

## 2016-06-07 DIAGNOSIS — R0902 Hypoxemia: Secondary | ICD-10-CM | POA: Diagnosis not present

## 2016-06-07 DIAGNOSIS — I5031 Acute diastolic (congestive) heart failure: Secondary | ICD-10-CM | POA: Diagnosis not present

## 2016-06-07 DIAGNOSIS — J9602 Acute respiratory failure with hypercapnia: Secondary | ICD-10-CM | POA: Diagnosis not present

## 2016-06-10 ENCOUNTER — Other Ambulatory Visit: Payer: Self-pay | Admitting: Licensed Clinical Social Worker

## 2016-06-10 NOTE — Patient Outreach (Signed)
Assessment:  CSW spoke via phone with client. CSW verified client identity. CSW and client spoke of client needs. Client had been receiving care at [facility] recently. She had received nursing care and physical therapy sessions as scheduled for client at facility. Client does see Dr. Harl Bowie, cardiologist, as scheduled. Client sees Dr. Consuello Masse as primary care doctor in the community.  Client was recenlty sent for care to [facility] in [place], [place]. She is currently still at [facility] receiving care. She is receiving oxygen via nasal canula as prescribed 24/7. She has been recently receiving prescribed antibiotics.  Client is eating adequately  and sleeping adequately. Client has oxygen concentrator at her home and she uses concentrator as needed to assist her with breathing. Client has rollator walker at home. Client has hospital bed at home. Client also has a ramp built at her home to assist her in going in and out of her home. Client had been recently  walking via use of a walker.   Client has family support. Her daughter, [name], is very supportive.  Client and CSW spoke of client care plan. CSW encouraged that client or daughter [name] please call CSW at [phone] as needed to discuss social work needs of client.      Plan:  Client to participate, as able, in scheduled client physical therapy sessions for client in next 30 days.  CSW to call client or [name] in 2 weeks to assess client needs.  [name].Sonal Dorwart MSW, LCSW Licensed Clinical Social Worker [facility] Care Management [phone]

## 2016-06-17 ENCOUNTER — Encounter: Payer: Self-pay | Admitting: Licensed Clinical Social Worker

## 2016-06-17 ENCOUNTER — Other Ambulatory Visit: Payer: Self-pay | Admitting: Licensed Clinical Social Worker

## 2016-06-17 NOTE — Patient Outreach (Signed)
Assessment:  CSW was informed that client died on Jul 08, 2016 at [facility].  Thus, due to death of client on 07-08-2016, CSW is discharging client from [place] services on 07/13/2016.  Plan:  CSW is discharging [name] [name] from [place] services on 07-13-16 due to death of client on Jul 08, 2016.  CSW to inform [name] [name] that [place] discharged client from [facility] CSW services on 07-13-2016.  CSW to fax physician case closure letter to [name] [name] informing [name] [name] that [place] discharged client from [facility] CSW services on 07-13-2016 due to death of client on 2016/07/08.  [name] [name] MSW, LCSW Licensed Clinical Social Worker [facility] Care Management [phone]

## 2016-06-21 DIAGNOSIS — I509 Heart failure, unspecified: Secondary | ICD-10-CM | POA: Diagnosis not present

## 2016-06-23 ENCOUNTER — Ambulatory Visit: Payer: Medicare HMO | Admitting: Gastroenterology

## 2016-06-24 ENCOUNTER — Ambulatory Visit: Payer: Medicare HMO | Admitting: Licensed Clinical Social Worker

## 2016-07-06 DEATH — deceased

## 2016-07-28 ENCOUNTER — Ambulatory Visit: Payer: Medicare HMO | Admitting: Cardiology
